# Patient Record
Sex: Female | Born: 1945
Health system: Southern US, Community
[De-identification: ages and names within clinical notes are randomized; demographics above are authoritative.]

## PROBLEM LIST (undated history)

## (undated) DIAGNOSIS — T8859XA Other complications of anesthesia, initial encounter: Secondary | ICD-10-CM

## (undated) DIAGNOSIS — H3530 Unspecified macular degeneration: Secondary | ICD-10-CM

## (undated) DIAGNOSIS — G4730 Sleep apnea, unspecified: Secondary | ICD-10-CM

## (undated) DIAGNOSIS — Z8489 Family history of other specified conditions: Secondary | ICD-10-CM

## (undated) DIAGNOSIS — E785 Hyperlipidemia, unspecified: Secondary | ICD-10-CM

## (undated) DIAGNOSIS — G43909 Migraine, unspecified, not intractable, without status migrainosus: Secondary | ICD-10-CM

## (undated) DIAGNOSIS — M1990 Unspecified osteoarthritis, unspecified site: Secondary | ICD-10-CM

## (undated) DIAGNOSIS — K5790 Diverticulosis of intestine, part unspecified, without perforation or abscess without bleeding: Secondary | ICD-10-CM

## (undated) DIAGNOSIS — H269 Unspecified cataract: Secondary | ICD-10-CM

## (undated) DIAGNOSIS — Z8719 Personal history of other diseases of the digestive system: Secondary | ICD-10-CM

## (undated) DIAGNOSIS — F32A Depression, unspecified: Secondary | ICD-10-CM

## (undated) DIAGNOSIS — I5032 Chronic diastolic (congestive) heart failure: Secondary | ICD-10-CM

## (undated) DIAGNOSIS — F419 Anxiety disorder, unspecified: Secondary | ICD-10-CM

## (undated) DIAGNOSIS — I471 Supraventricular tachycardia: Secondary | ICD-10-CM

## (undated) DIAGNOSIS — J449 Chronic obstructive pulmonary disease, unspecified: Secondary | ICD-10-CM

## (undated) DIAGNOSIS — K219 Gastro-esophageal reflux disease without esophagitis: Secondary | ICD-10-CM

## (undated) DIAGNOSIS — I4719 Other supraventricular tachycardia: Secondary | ICD-10-CM

## (undated) DIAGNOSIS — J189 Pneumonia, unspecified organism: Secondary | ICD-10-CM

## (undated) DIAGNOSIS — I700 Atherosclerosis of aorta: Secondary | ICD-10-CM

## (undated) DIAGNOSIS — C801 Malignant (primary) neoplasm, unspecified: Secondary | ICD-10-CM

## (undated) DIAGNOSIS — E669 Obesity, unspecified: Secondary | ICD-10-CM

## (undated) DIAGNOSIS — G4700 Insomnia, unspecified: Secondary | ICD-10-CM

## (undated) DIAGNOSIS — I509 Heart failure, unspecified: Secondary | ICD-10-CM

## (undated) DIAGNOSIS — G629 Polyneuropathy, unspecified: Secondary | ICD-10-CM

## (undated) DIAGNOSIS — I10 Essential (primary) hypertension: Secondary | ICD-10-CM

## (undated) DIAGNOSIS — I480 Paroxysmal atrial fibrillation: Secondary | ICD-10-CM

## (undated) HISTORY — PX: COLONOSCOPY: SHX174

## (undated) HISTORY — DX: Essential (primary) hypertension: I10

## (undated) HISTORY — DX: Other supraventricular tachycardia: I47.19

## (undated) HISTORY — DX: Diverticulosis of intestine, part unspecified, without perforation or abscess without bleeding: K57.90

## (undated) HISTORY — DX: Obesity, unspecified: E66.9

## (undated) HISTORY — PX: POLYPECTOMY: SHX149

## (undated) HISTORY — DX: Chronic obstructive pulmonary disease, unspecified: J44.9

## (undated) HISTORY — DX: Chronic diastolic (congestive) heart failure: I50.32

## (undated) HISTORY — DX: Anxiety disorder, unspecified: F41.9

## (undated) HISTORY — DX: Polyneuropathy, unspecified: G62.9

## (undated) HISTORY — DX: Hyperlipidemia, unspecified: E78.5

## (undated) HISTORY — DX: Gastro-esophageal reflux disease without esophagitis: K21.9

## (undated) HISTORY — PX: CERVICAL POLYPECTOMY: SHX88

## (undated) HISTORY — DX: Paroxysmal atrial fibrillation: I48.0

## (undated) HISTORY — PX: EYE SURGERY: SHX253

## (undated) HISTORY — DX: Insomnia, unspecified: G47.00

## (undated) HISTORY — DX: Atherosclerosis of aorta: I70.0

## (undated) HISTORY — PX: OTHER SURGICAL HISTORY: SHX169

## (undated) HISTORY — DX: Supraventricular tachycardia: I47.1

---

## 1997-10-21 ENCOUNTER — Ambulatory Visit (HOSPITAL_COMMUNITY): Admission: RE | Admit: 1997-10-21 | Discharge: 1997-10-21 | Payer: Self-pay | Admitting: Family Medicine

## 1998-05-20 DIAGNOSIS — E041 Nontoxic single thyroid nodule: Secondary | ICD-10-CM

## 1998-05-20 HISTORY — DX: Nontoxic single thyroid nodule: E04.1

## 1998-06-30 ENCOUNTER — Encounter: Admission: RE | Admit: 1998-06-30 | Discharge: 1998-09-28 | Payer: Self-pay | Admitting: Family Medicine

## 1999-03-28 ENCOUNTER — Other Ambulatory Visit: Admission: RE | Admit: 1999-03-28 | Discharge: 1999-03-28 | Payer: Self-pay | Admitting: Obstetrics and Gynecology

## 1999-09-18 ENCOUNTER — Other Ambulatory Visit: Admission: RE | Admit: 1999-09-18 | Discharge: 1999-09-18 | Payer: Self-pay | Admitting: Obstetrics and Gynecology

## 1999-10-25 ENCOUNTER — Encounter (INDEPENDENT_AMBULATORY_CARE_PROVIDER_SITE_OTHER): Payer: Self-pay | Admitting: Specialist

## 1999-10-25 ENCOUNTER — Other Ambulatory Visit: Admission: RE | Admit: 1999-10-25 | Discharge: 1999-10-25 | Payer: Self-pay | Admitting: Obstetrics and Gynecology

## 2000-03-31 ENCOUNTER — Other Ambulatory Visit: Admission: RE | Admit: 2000-03-31 | Discharge: 2000-03-31 | Payer: Self-pay | Admitting: Obstetrics and Gynecology

## 2000-09-16 ENCOUNTER — Other Ambulatory Visit: Admission: RE | Admit: 2000-09-16 | Discharge: 2000-09-16 | Payer: Self-pay | Admitting: Obstetrics and Gynecology

## 2001-02-17 ENCOUNTER — Encounter: Payer: Self-pay | Admitting: Family Medicine

## 2001-02-17 ENCOUNTER — Encounter: Admission: RE | Admit: 2001-02-17 | Discharge: 2001-02-17 | Payer: Self-pay | Admitting: Emergency Medicine

## 2001-02-23 ENCOUNTER — Encounter: Admission: RE | Admit: 2001-02-23 | Discharge: 2001-04-09 | Payer: Self-pay | Admitting: Family Medicine

## 2001-04-10 ENCOUNTER — Other Ambulatory Visit: Admission: RE | Admit: 2001-04-10 | Discharge: 2001-04-10 | Payer: Self-pay | Admitting: Obstetrics and Gynecology

## 2001-11-17 ENCOUNTER — Encounter: Payer: Self-pay | Admitting: Internal Medicine

## 2001-11-17 ENCOUNTER — Ambulatory Visit (HOSPITAL_COMMUNITY): Admission: RE | Admit: 2001-11-17 | Discharge: 2001-11-17 | Payer: Self-pay | Admitting: Internal Medicine

## 2002-04-06 ENCOUNTER — Encounter: Admission: RE | Admit: 2002-04-06 | Discharge: 2002-04-06 | Payer: Self-pay | Admitting: Family Medicine

## 2002-04-06 ENCOUNTER — Encounter: Payer: Self-pay | Admitting: Family Medicine

## 2002-05-03 ENCOUNTER — Other Ambulatory Visit: Admission: RE | Admit: 2002-05-03 | Discharge: 2002-05-03 | Payer: Self-pay | Admitting: Obstetrics and Gynecology

## 2003-06-28 ENCOUNTER — Ambulatory Visit (HOSPITAL_COMMUNITY): Admission: RE | Admit: 2003-06-28 | Discharge: 2003-06-28 | Payer: Self-pay | Admitting: Endocrinology

## 2003-07-21 ENCOUNTER — Encounter (HOSPITAL_COMMUNITY): Admission: RE | Admit: 2003-07-21 | Discharge: 2003-10-19 | Payer: Self-pay | Admitting: Endocrinology

## 2003-07-22 ENCOUNTER — Other Ambulatory Visit: Admission: RE | Admit: 2003-07-22 | Discharge: 2003-07-22 | Payer: Self-pay | Admitting: Obstetrics and Gynecology

## 2003-08-04 ENCOUNTER — Encounter (INDEPENDENT_AMBULATORY_CARE_PROVIDER_SITE_OTHER): Payer: Self-pay | Admitting: *Deleted

## 2003-08-04 ENCOUNTER — Ambulatory Visit (HOSPITAL_COMMUNITY): Admission: RE | Admit: 2003-08-04 | Discharge: 2003-08-04 | Payer: Self-pay | Admitting: Endocrinology

## 2003-10-14 ENCOUNTER — Ambulatory Visit (HOSPITAL_COMMUNITY): Admission: RE | Admit: 2003-10-14 | Discharge: 2003-10-14 | Payer: Self-pay | Admitting: Endocrinology

## 2004-04-04 ENCOUNTER — Ambulatory Visit: Payer: Self-pay | Admitting: Pulmonary Disease

## 2004-06-05 ENCOUNTER — Ambulatory Visit: Payer: Self-pay | Admitting: Adult Health

## 2004-06-12 ENCOUNTER — Ambulatory Visit: Payer: Self-pay | Admitting: Pulmonary Disease

## 2004-06-19 ENCOUNTER — Ambulatory Visit: Payer: Self-pay | Admitting: Pulmonary Disease

## 2004-08-29 ENCOUNTER — Ambulatory Visit: Payer: Self-pay | Admitting: Pulmonary Disease

## 2004-09-13 ENCOUNTER — Other Ambulatory Visit: Admission: RE | Admit: 2004-09-13 | Discharge: 2004-09-13 | Payer: Self-pay | Admitting: Obstetrics and Gynecology

## 2005-05-22 ENCOUNTER — Ambulatory Visit: Payer: Self-pay | Admitting: Pulmonary Disease

## 2005-09-05 ENCOUNTER — Ambulatory Visit: Payer: Self-pay | Admitting: Gastroenterology

## 2005-09-06 ENCOUNTER — Ambulatory Visit: Payer: Self-pay | Admitting: Gastroenterology

## 2005-09-24 ENCOUNTER — Ambulatory Visit: Payer: Self-pay | Admitting: Gastroenterology

## 2005-09-24 ENCOUNTER — Encounter (INDEPENDENT_AMBULATORY_CARE_PROVIDER_SITE_OTHER): Payer: Self-pay | Admitting: *Deleted

## 2006-03-06 ENCOUNTER — Ambulatory Visit: Payer: Self-pay | Admitting: Internal Medicine

## 2006-10-16 ENCOUNTER — Ambulatory Visit: Payer: Self-pay | Admitting: Pulmonary Disease

## 2006-10-31 ENCOUNTER — Ambulatory Visit: Payer: Self-pay | Admitting: Pulmonary Disease

## 2007-04-25 DIAGNOSIS — J209 Acute bronchitis, unspecified: Secondary | ICD-10-CM | POA: Insufficient documentation

## 2007-04-25 DIAGNOSIS — J441 Chronic obstructive pulmonary disease with (acute) exacerbation: Secondary | ICD-10-CM | POA: Insufficient documentation

## 2007-04-25 DIAGNOSIS — J45901 Unspecified asthma with (acute) exacerbation: Secondary | ICD-10-CM | POA: Insufficient documentation

## 2007-04-25 DIAGNOSIS — J449 Chronic obstructive pulmonary disease, unspecified: Secondary | ICD-10-CM

## 2007-11-19 ENCOUNTER — Telehealth (INDEPENDENT_AMBULATORY_CARE_PROVIDER_SITE_OTHER): Payer: Self-pay | Admitting: *Deleted

## 2010-06-09 ENCOUNTER — Encounter: Payer: Self-pay | Admitting: Endocrinology

## 2010-10-02 NOTE — Assessment & Plan Note (Signed)
[facility]                             PULMONARY OFFICE NOTE   [name] Molina, [name] Molina                        MRN:          [id number]  DATE:10/16/2006                            DOB:          1945-10-04    HISTORY OF PRESENT ILLNESS:  The patient is a 65 year old white female  patient of Dr. Sung Amabile, has a known history of mild COPD and asthmatic  bronchitis and a former smoker.  She presents today with a two-day  history of nasal congestion, postnasal drip, sore throat, and cough.  The patient denies any hemoptysis, orthopnea, PND or leg swelling.  The  patient complains that mucus is very thick and yellow.   PAST MEDICAL HISTORY:  Reviewed.   CURRENT MEDICATIONS:  Reviewed.   PHYSICAL EXAMINATION:  The patient is a pleasant female in no acute  distress.  She is afebrile with stable vital signs.  She is saturating at 97% on  room air.  HEENT:  Nasal mucosa is slightly pale.  Nontender esophagus.  Posterior  pharynx is clear.  NECK:  Supple without adenopathy.  LUNGS:  Sounds reveal coarse breath sounds without any wheezes or  crackles.  CARDIAC:  Regular rate.  ABDOMEN:  Soft and nontender.  EXTREMITIES:  Warm without any edema.   IMPRESSION/PLAN:  A mild upper respiratory infection.  The patient is to  begin Mucinex DM twice daily. The patient was given a prescription for a  Z-pack to hold in case symptoms worsen with discolored sputum.  The  patient was given Xopenex nebulizer treatment in the office.  The  patient will return here in one month with Dr. Sung Amabile or sooner if  needed.      Rubye Oaks, NP  Electronically Signed      [name], MD  Electronically Signed   TP/MedQ  DD: 10/16/2006  DT: 10/16/2006  Job #: [id number]

## 2010-10-05 NOTE — Assessment & Plan Note (Signed)
[facility]                               PULMONARY OFFICE NOTE   NAME:[name] Molina, [name] Molina                        MRN:          [id number]  DATE:03/06/2006                            DOB:          10/30/1945    HISTORY OF PRESENT ILLNESS:  The patient is a 65 year old white female, a  patient of Dr. Sung Amabile, who has a known history of mild COPD and asthmatic  bronchitis and a former smoker.  The patient presents for acute office visit  with a 2-day history of nasal congestion, productive cough, and general  malaise.  The patient denies hemoptysis, chest pain, orthopnea, PND, or leg  swelling.   The patient is maintained on:  1. Advair 250/50, twice daily.  2. Albuterol p.r.n.   PHYSICAL EXAMINATION:  GENERAL:  The patient is a pleasant female in no  acute distress.  VITAL SIGNS:  She is afebrile with stable vital signs.  Her O2 saturation is  94% on room air.  HEENT:  Unremarkable.  NECK:  Supple without adenopathy or bruit.  LUNGS:  Sounds are clear without any wheezing or crackles.  CARDIAC:  Regular rate and rhythm.  ABDOMEN:  Soft and obese.  EXTREMITIES:  Warm without any edema.   IMPRESSION:  Acute upper respiratory infection.   PLAN:  The patient is to begin Mucinex DM twice daily, may use Endal HD as  needed for cough, #8 ounces given.  The patient is aware of this.  The  patient is given a prescription for [facility] for 7 days, however, I have  instructed the patient to have this on hold and only to use it if symptoms  worsen over the next 5-7 days.  I suspect this may be viral in nature.      ______________________________  Rubye Oaks, NP    ______________________________  [name], MD     TP/MedQ  DD:  03/06/2006  DT:  03/08/2006  Job #:  [id number]

## 2010-10-27 ENCOUNTER — Encounter: Payer: Self-pay | Admitting: Gastroenterology

## 2010-11-05 ENCOUNTER — Encounter: Payer: Self-pay | Admitting: Gastroenterology

## 2010-11-05 ENCOUNTER — Ambulatory Visit (AMBULATORY_SURGERY_CENTER): Payer: 59 | Admitting: *Deleted

## 2010-11-05 VITALS — Ht 63.0 in | Wt 195.0 lb

## 2010-11-05 DIAGNOSIS — Z86010 Personal history of colonic polyps: Secondary | ICD-10-CM

## 2010-11-05 MED ORDER — PEG-KCL-NACL-NASULF-NA ASC-C 100 G PO SOLR: ORAL | Status: DC

## 2010-11-19 ENCOUNTER — Encounter: Payer: Self-pay | Admitting: Gastroenterology

## 2010-11-19 ENCOUNTER — Ambulatory Visit (AMBULATORY_SURGERY_CENTER): Payer: 59 | Admitting: Gastroenterology

## 2010-11-19 VITALS — BP 128/74 | HR 97 | Temp 98.6°F | Resp 20 | Ht 63.0 in | Wt 185.0 lb

## 2010-11-19 DIAGNOSIS — Z1211 Encounter for screening for malignant neoplasm of colon: Secondary | ICD-10-CM

## 2010-11-19 DIAGNOSIS — K5730 Diverticulosis of large intestine without perforation or abscess without bleeding: Secondary | ICD-10-CM

## 2010-11-19 DIAGNOSIS — Z86010 Personal history of colonic polyps: Secondary | ICD-10-CM

## 2010-11-19 MED ORDER — SODIUM CHLORIDE 0.9 % IV SOLN: 500.0000 mL | INTRAVENOUS | Status: DC

## 2010-11-19 NOTE — Patient Instructions (Signed)
[employer] reviewed with patient and care partner.  [facility] Discharge Instructions signed by care partner.  Impressions:  Hemorrhoids (handout given) Diverticulosis (handout given)  High Fiber Diet handout given.  Repeat exam in 7 years.  Continue medications as you were taking them prior to your procedure.

## 2010-11-19 NOTE — Progress Notes (Signed)
Pt was uncomfortable while the scope was being advanced.  Per Dr. Arlyce Dice give IV Benadryl 12.5 mg.  Done. MAW  After the cecum was reached the pt relaxed and rested comfortably.  No complaints noted.  MAW

## 2010-11-20 ENCOUNTER — Telehealth: Payer: Self-pay

## 2010-11-20 NOTE — Telephone Encounter (Signed)

## 2011-05-22 DIAGNOSIS — J209 Acute bronchitis, unspecified: Secondary | ICD-10-CM | POA: Diagnosis not present

## 2011-05-22 DIAGNOSIS — J45909 Unspecified asthma, uncomplicated: Secondary | ICD-10-CM | POA: Diagnosis not present

## 2011-06-13 ENCOUNTER — Encounter: Payer: Self-pay | Admitting: Emergency Medicine

## 2011-06-13 ENCOUNTER — Ambulatory Visit (INDEPENDENT_AMBULATORY_CARE_PROVIDER_SITE_OTHER): Payer: Medicare Other | Admitting: Emergency Medicine

## 2011-06-13 DIAGNOSIS — R51 Headache: Secondary | ICD-10-CM

## 2011-06-13 DIAGNOSIS — I10 Essential (primary) hypertension: Secondary | ICD-10-CM | POA: Diagnosis not present

## 2011-06-13 DIAGNOSIS — J438 Other emphysema: Secondary | ICD-10-CM | POA: Diagnosis not present

## 2011-06-13 DIAGNOSIS — J449 Chronic obstructive pulmonary disease, unspecified: Secondary | ICD-10-CM | POA: Insufficient documentation

## 2011-06-13 DIAGNOSIS — J309 Allergic rhinitis, unspecified: Secondary | ICD-10-CM

## 2011-06-13 DIAGNOSIS — G8929 Other chronic pain: Secondary | ICD-10-CM | POA: Insufficient documentation

## 2011-06-13 NOTE — Assessment & Plan Note (Signed)
Continue singulair for now Restart allegra qd Start nasal steroid for the next month

## 2011-06-13 NOTE — Progress Notes (Signed)
Subjective:    Patient ID: [name] Molina, female    DOB: 01-03-1946, 66 y.o.   MRN: [id number]  HPI 66 yo woman, hx former tobacco and childhood asthma. Formerly followed by Dr Sung Amabile for mild COPD. Also hx GERD, HTN.  She was managed on Advair for years, but now that she is on Medicare she is looking for a less expensive option. She stopped the Advair in November.  Also, since September she has had much more trouble with nasal gtt, throat irritation, cough, throat clearing. More SABA use.    Review of Systems  Constitutional: Negative.  Negative for fever and unexpected weight change.  HENT: Positive for sneezing. Negative for ear pain, nosebleeds, congestion, sore throat, rhinorrhea, trouble swallowing, dental problem, postnasal drip and sinus pressure.   Eyes: Negative.  Negative for redness and itching.  Respiratory: Positive for cough and shortness of breath. Negative for chest tightness and wheezing.   Cardiovascular: Positive for leg swelling. Negative for palpitations.  Gastrointestinal: Negative.  Negative for nausea and vomiting.  Genitourinary: Negative.  Negative for dysuria.  Musculoskeletal: Negative.  Negative for joint swelling.  Skin: Negative.  Negative for rash.  Neurological: Positive for headaches.  Hematological: Does not bruise/bleed easily.  Psychiatric/Behavioral: Negative.  Negative for dysphoric mood. The patient is not nervous/anxious.    Past Medical History  Diagnosis Date  . COPD (chronic obstructive pulmonary disease)   . Asthma   . GERD (gastroesophageal reflux disease)   . Hyperlipidemia   . Hypertension   . Atrial tachycardia   . Diverticulosis   . Insomnia   . Anxiety   . Neuropathy      Family History  Problem Relation Age of Onset  . Colon polyps Sister 44    PRECANCEROUS POLYPS  . Breast cancer Maternal Grandmother 49  . Emphysema Father   . Asthma Father   . Heart disease Father   . Heart disease Paternal Grandfather      History     Social History  . Marital Status: Divorced    Spouse Name: N/A    Number of Children: N/A  . Years of Education: N/A   Occupational History  . Psychologist, prison and probation services    Social History Main Topics  . Smoking status: Former Smoker -- 1.0 packs/day for 20 years    Types: Cigarettes    Quit date: 05/21/1991  . Smokeless tobacco: Never Used  . Alcohol Use: No  . Drug Use: No  . Sexually Active: Not on file   Other Topics Concern  . Not on file   Social History Narrative  . No narrative on file     Allergies  Allergen Reactions  . Food Shortness Of Breath    ALLERGY= MUSHROOMS  . Tequin Shortness Of Breath  . Codeine Nausea And Vomiting  . Erythromycin Other (See Comments)    GI UPSET  . Wellbutrin (Bupropion Hcl) Other (See Comments)    SHAKING  . Penicillins Rash  . Prednisone Rash     Outpatient Prescriptions Prior to Visit  Medication Sig Dispense Refill  . Albuterol (VENTOLIN IN) Inhale 2 puffs into the lungs as needed.        Marland Kitchen aspirin 81 MG tablet Take 81 mg by mouth daily.        . B Complex-C (SUPER B COMPLEX PO) Take 1 tablet by mouth daily.        . Calcium Carbonate-Vit D-Min (CALCIUM 1200 PO) Take 1 tablet by mouth daily. (slow release)       .  diltiazem (CARDIZEM CD) 120 MG 24 hr capsule Take 120 mg by mouth Daily.      Marland Kitchen eletriptan (RELPAX) 40 MG tablet One tablet by mouth as needed for migraine headache.  If the headache improves and then returns, dose may be repeated after 2 hours have elapsed since first dose (do not exceed 80 mg per day). may repeat in 2 hours if necessary       . FLUoxetine (PROZAC) 20 MG capsule Take 20 mg by mouth Daily.      . indomethacin (INDOCIN) 50 MG capsule Take 50 mg by mouth 2 (two) times daily as needed.        Marland Kitchen LORazepam (ATIVAN) 0.5 MG tablet Take 0.5 mg by mouth as needed. 1/2 TAB-1 TAB       . losartan (COZAAR) 50 MG tablet Take 50 mg by mouth Daily.      . Multiple Vitamins-Minerals (OCUVITE PO) Take 2 tablets by  mouth daily.        Marland Kitchen omeprazole (PRILOSEC) 20 MG capsule Take 20 mg by mouth Nightly.        . phentermine 30 MG capsule Take 30 mg by mouth every morning.        . pravastatin (PRAVACHOL) 40 MG tablet Take 40 mg by mouth Daily.      . temazepam (RESTORIL) 30 MG capsule Take 1 tablet by mouth Nightly.      Marland Kitchen UNABLE TO FIND Inject 35 Units as directed once a week. Med Name: HCG       . ADVAIR DISKUS 250-50 MCG/DOSE AEPB Inhale 1 puff into the lungs Twice daily.       . peg 3350 powder (MOVIPREP) 100 G SOLR MOVIPREP-TAKE AS DIRECTED  1 kit  0   Facility-Administered Medications Prior to Visit  Medication Dose Route Frequency Provider Last Rate Last Dose  . 0.9 %  sodium chloride infusion  500 mL Intravenous Continuous Louis Meckel, MD           Objective:   Physical Exam  Gen: Pleasant, well-nourished, in no distress,  normal affect  ENT: No lesions,  mouth clear,  oropharynx clear, no postnasal drip  Neck: No JVD, no TMG, no carotid bruits  Lungs: No use of accessory muscles, no dullness to percussion, clear without rales or rhonchi  Cardiovascular: RRR, heart sounds normal, no murmur or gallops, no peripheral edema  Musculoskeletal: No deformities, no cyanosis or clubbing  Neuro: alert, non focal  Skin: Warm, no lesions or rashes     Assessment & Plan:  Allergic rhinitis Continue singulair for now Restart allegra qd Start nasal steroid for the next month  COPD Please continue Symbicort twice a day for now. We may decide to change this depending how you feel when your allergies are well controlled.  We will work on getting financial assistance for the Symbicort or Advair.

## 2011-06-13 NOTE — Assessment & Plan Note (Signed)
Please continue Symbicort twice a day for now. We may decide to change this depending how you feel when your allergies are well controlled.  We will work on getting financial assistance for the Symbicort or Advair.

## 2011-06-13 NOTE — Patient Instructions (Signed)
Please continue Symbicort twice a day for now. We may decide to change this depending how you feel when your allergies are well controlled.  We will work on getting financial assistance for the Symbicort or Advair. Continue your Singulair Restart generic allegra every day Start Qnasl, 2 sprays each nostril daily.  Follow with Dr Delton Coombes in 1 month

## 2011-06-24 ENCOUNTER — Encounter (INDEPENDENT_AMBULATORY_CARE_PROVIDER_SITE_OTHER): Payer: Medicare Other | Admitting: Ophthalmology

## 2011-06-24 DIAGNOSIS — H43819 Vitreous degeneration, unspecified eye: Secondary | ICD-10-CM

## 2011-06-24 DIAGNOSIS — H3530 Unspecified macular degeneration: Secondary | ICD-10-CM | POA: Diagnosis not present

## 2011-06-24 DIAGNOSIS — H353290 Exudative age-related macular degeneration, unspecified eye, stage unspecified: Secondary | ICD-10-CM

## 2011-06-24 DIAGNOSIS — H2510 Age-related nuclear cataract, unspecified eye: Secondary | ICD-10-CM | POA: Diagnosis not present

## 2011-06-24 DIAGNOSIS — H25049 Posterior subcapsular polar age-related cataract, unspecified eye: Secondary | ICD-10-CM

## 2011-06-28 ENCOUNTER — Encounter (INDEPENDENT_AMBULATORY_CARE_PROVIDER_SITE_OTHER): Payer: Medicare Other | Admitting: Ophthalmology

## 2011-06-28 DIAGNOSIS — H353290 Exudative age-related macular degeneration, unspecified eye, stage unspecified: Secondary | ICD-10-CM | POA: Diagnosis not present

## 2011-06-28 DIAGNOSIS — H43819 Vitreous degeneration, unspecified eye: Secondary | ICD-10-CM

## 2011-06-28 DIAGNOSIS — H3530 Unspecified macular degeneration: Secondary | ICD-10-CM | POA: Diagnosis not present

## 2011-07-22 ENCOUNTER — Encounter: Payer: Self-pay | Admitting: Emergency Medicine

## 2011-07-22 ENCOUNTER — Encounter (INDEPENDENT_AMBULATORY_CARE_PROVIDER_SITE_OTHER): Payer: Medicare Other | Admitting: Ophthalmology

## 2011-07-22 ENCOUNTER — Ambulatory Visit (INDEPENDENT_AMBULATORY_CARE_PROVIDER_SITE_OTHER): Payer: Medicare Other | Admitting: Emergency Medicine

## 2011-07-22 DIAGNOSIS — H2510 Age-related nuclear cataract, unspecified eye: Secondary | ICD-10-CM

## 2011-07-22 DIAGNOSIS — H3530 Unspecified macular degeneration: Secondary | ICD-10-CM

## 2011-07-22 DIAGNOSIS — H353290 Exudative age-related macular degeneration, unspecified eye, stage unspecified: Secondary | ICD-10-CM

## 2011-07-22 DIAGNOSIS — J4489 Other specified chronic obstructive pulmonary disease: Secondary | ICD-10-CM

## 2011-07-22 DIAGNOSIS — J309 Allergic rhinitis, unspecified: Secondary | ICD-10-CM | POA: Diagnosis not present

## 2011-07-22 DIAGNOSIS — H43819 Vitreous degeneration, unspecified eye: Secondary | ICD-10-CM

## 2011-07-22 DIAGNOSIS — J449 Chronic obstructive pulmonary disease, unspecified: Secondary | ICD-10-CM

## 2011-07-22 NOTE — Progress Notes (Signed)
  Subjective:    Patient ID: [name] Molina, female    DOB: [birth year], 66 y.o.   MRN: [id number]  HPI 66 yo woman, hx former tobacco and childhood asthma. Formerly followed by Dr Sung Amabile for mild COPD. Also hx GERD, HTN.  She was managed on Advair for years, but now that she is on Medicare she is looking for a less expensive option. She stopped the Advair in November.  Also, since September she has had much more trouble with nasal gtt, throat irritation, cough, throat clearing. More SABA use.   ROV 07/22/11 -- follows up for COPD +/- asthma, allergies and cough.  Continued singulair, restarted allegra and added Qnasl. She is taking symbicort bid, needs to get financial assistance w this. Her nasal gtt is a lot better now, in fact her nose is dry on the Qnasl. Her breathing seems to be better also.        Objective:   Physical Exam  Gen: Pleasant, well-nourished, in no distress,  normal affect  ENT: No lesions,  mouth clear,  oropharynx clear, no postnasal drip  Neck: No JVD, no TMG, no carotid bruits  Lungs: No use of accessory muscles, no dullness to percussion, clear without rales or rhonchi  Cardiovascular: RRR, heart sounds normal, no murmur or gallops, no peripheral edema  Musculoskeletal: No deformities, no cyanosis or clubbing  Neuro: alert, non focal  Skin: Warm, no lesions or rashes     Assessment & Plan:  Allergic rhinitis Improved, dryness w the Qnasl.  - stop the nasal steroid - continue the singulair and allegra for now, consider stopping these serially to see if she tolerates.  - try stopping the symbicort and see if she misses.  - use SABA prn. - rov 6 weeks to see if she misses  COPD - trial off Symbicort now that her allergies are better controlled.

## 2011-07-22 NOTE — Assessment & Plan Note (Signed)
Improved, dryness w the Qnasl.  - stop the nasal steroid - continue the singulair and allegra for now, consider stopping these serially to see if she tolerates.  - try stopping the symbicort and see if she misses.  - use SABA prn. - rov 6 weeks to see if she misses

## 2011-07-22 NOTE — Assessment & Plan Note (Signed)
-   trial off Symbicort now that her allergies are better controlled.

## 2011-07-22 NOTE — Patient Instructions (Signed)
Please continue the allegra (fexofenadine) and singulair Stop Qnasl spray Try stopping Symbicort for now. Staty off it for at least 10 days. If your breathing misses this medication then call our office to let us know that you are restarting.  Keep your albuterol inhaler available to use as needed Follow with Dr Delton Coombes in 6 weeks to discuss your status

## 2011-08-05 ENCOUNTER — Encounter (INDEPENDENT_AMBULATORY_CARE_PROVIDER_SITE_OTHER): Payer: 59 | Admitting: Ophthalmology

## 2011-08-16 ENCOUNTER — Encounter (INDEPENDENT_AMBULATORY_CARE_PROVIDER_SITE_OTHER): Payer: Medicare Other | Admitting: Ophthalmology

## 2011-08-16 DIAGNOSIS — H2510 Age-related nuclear cataract, unspecified eye: Secondary | ICD-10-CM | POA: Diagnosis not present

## 2011-08-16 DIAGNOSIS — H3530 Unspecified macular degeneration: Secondary | ICD-10-CM | POA: Diagnosis not present

## 2011-08-16 DIAGNOSIS — H43819 Vitreous degeneration, unspecified eye: Secondary | ICD-10-CM

## 2011-08-16 DIAGNOSIS — H353290 Exudative age-related macular degeneration, unspecified eye, stage unspecified: Secondary | ICD-10-CM

## 2011-08-29 DIAGNOSIS — E785 Hyperlipidemia, unspecified: Secondary | ICD-10-CM | POA: Diagnosis not present

## 2011-08-29 DIAGNOSIS — F329 Major depressive disorder, single episode, unspecified: Secondary | ICD-10-CM | POA: Diagnosis not present

## 2011-08-29 DIAGNOSIS — I10 Essential (primary) hypertension: Secondary | ICD-10-CM | POA: Diagnosis not present

## 2011-08-29 DIAGNOSIS — E0590 Thyrotoxicosis, unspecified without thyrotoxic crisis or storm: Secondary | ICD-10-CM | POA: Diagnosis not present

## 2011-08-29 DIAGNOSIS — Z23 Encounter for immunization: Secondary | ICD-10-CM | POA: Diagnosis not present

## 2011-09-09 ENCOUNTER — Ambulatory Visit (INDEPENDENT_AMBULATORY_CARE_PROVIDER_SITE_OTHER): Payer: Medicare Other | Admitting: Emergency Medicine

## 2011-09-09 ENCOUNTER — Encounter: Payer: Self-pay | Admitting: Emergency Medicine

## 2011-09-09 VITALS — BP 118/74 | HR 80 | Temp 97.7°F | Ht 63.0 in | Wt 189.0 lb

## 2011-09-09 DIAGNOSIS — J449 Chronic obstructive pulmonary disease, unspecified: Secondary | ICD-10-CM | POA: Diagnosis not present

## 2011-09-09 NOTE — Progress Notes (Signed)
  Subjective:    Patient ID: [name] Molina, female    DOB: 1946-02-04, 66 y.o.   MRN: [id number]  HPI 66 yo woman, hx former tobacco and childhood asthma. Formerly followed by Dr Sung Amabile for mild COPD. Also hx GERD, HTN.  She was managed on Advair for years, but now that she is on Medicare she is looking for a less expensive option. She stopped the Advair in November.  Also, since September she has had much more trouble with nasal gtt, throat irritation, cough, throat clearing. More SABA use.   ROV 07/22/11 -- OSA/OHS, COPD, allergies and chronic cough.  Continued singulair, restarted allegra and added Qnasl. She is taking symbicort bid, needs to get financial assistance w this. Her nasal gtt is a lot better now, in fact her nose is dry on the Qnasl. Her breathing seems to be better also.   ROV 09/09/11 -- OSA/OHS, COPD, allergies and cough. We stopped Qnasl due to dryness, tried stopping Symbicort but needed to restart it. She has since run out and is ready to start Dulera 1 puff bid, possibly uptitrate to 2 puffs bid. She is on [employer]. No exacerbations since last time      Objective:   Physical Exam  Gen: Pleasant, well-nourished, in no distress,  normal affect  ENT: No lesions,  mouth clear,  oropharynx clear, no postnasal drip  Neck: No JVD, no TMG, no carotid bruits  Lungs: No use of accessory muscles, no dullness to percussion, clear without rales or rhonchi  Cardiovascular: RRR, heart sounds normal, no murmur or gallops, no peripheral edema  Musculoskeletal: No deformities, no cyanosis or clubbing  Neuro: alert, non focal  Skin: Warm, no lesions or rashes     Assessment & Plan:  COPD She has started and stopped her LABA/ICS as she felt like it. I have encouraged her to start using Dulera at half dose and to stay on it every day to see if she improves. She will call our office to tell us if she wants [place] to prescribe. We can discuss upping dose later.

## 2011-09-09 NOTE — Assessment & Plan Note (Signed)
She has started and stopped her LABA/ICS as she felt like it. I have encouraged her to start using Dulera at half dose and to stay on it every day to see if she improves. She will call our office to tell us if she wants [place] to prescribe. We can discuss upping dose later.

## 2011-09-09 NOTE — Patient Instructions (Signed)
Please take Dulera 1 puff twice a day. Call our office if this benefits you and we will send prescription to your pharmacy Stay on allegra and singulair.  Use albuterol as needed Follow with Dr Delton Coombes in 4 months or sooner if you have any problems

## 2011-09-20 ENCOUNTER — Encounter (INDEPENDENT_AMBULATORY_CARE_PROVIDER_SITE_OTHER): Payer: Medicare Other | Admitting: Ophthalmology

## 2011-09-20 DIAGNOSIS — H3530 Unspecified macular degeneration: Secondary | ICD-10-CM

## 2011-09-20 DIAGNOSIS — H353290 Exudative age-related macular degeneration, unspecified eye, stage unspecified: Secondary | ICD-10-CM

## 2011-09-20 DIAGNOSIS — H43819 Vitreous degeneration, unspecified eye: Secondary | ICD-10-CM | POA: Diagnosis not present

## 2011-10-25 ENCOUNTER — Encounter (INDEPENDENT_AMBULATORY_CARE_PROVIDER_SITE_OTHER): Payer: Medicare Other | Admitting: Ophthalmology

## 2011-10-25 DIAGNOSIS — H3530 Unspecified macular degeneration: Secondary | ICD-10-CM | POA: Diagnosis not present

## 2011-10-25 DIAGNOSIS — H43819 Vitreous degeneration, unspecified eye: Secondary | ICD-10-CM | POA: Diagnosis not present

## 2011-10-25 DIAGNOSIS — H353290 Exudative age-related macular degeneration, unspecified eye, stage unspecified: Secondary | ICD-10-CM | POA: Diagnosis not present

## 2011-10-25 DIAGNOSIS — H2510 Age-related nuclear cataract, unspecified eye: Secondary | ICD-10-CM | POA: Diagnosis not present

## 2011-11-06 ENCOUNTER — Telehealth: Payer: Self-pay | Admitting: Emergency Medicine

## 2011-11-06 MED ORDER — MOMETASONE FURO-FORMOTEROL FUM 200-5 MCG/ACT IN AERO: 1.0000 | INHALATION_SPRAY | Freq: Two times a day (BID) | RESPIRATORY_TRACT | Status: DC

## 2011-11-06 NOTE — Telephone Encounter (Signed)
Per 4.22.13 ov note:  Patient Instructions     Please take Dulera 1 puff twice a day. Call our office if this benefits you and we will send prescription to your pharmacy  Stay on allegra and singulair.  Use albuterol as needed  Follow with Dr Delton Coombes in 4 months or sooner if you have any problems    LMOM TCB x1

## 2011-11-06 NOTE — Telephone Encounter (Signed)
Called and spoke with pt. She states that the dulera seems to be helping with her breathing and wants to have an rx called in. I have sent this to her pharmacy and she states nothing further needed.

## 2011-11-26 DIAGNOSIS — Z1231 Encounter for screening mammogram for malignant neoplasm of breast: Secondary | ICD-10-CM | POA: Diagnosis not present

## 2011-11-26 DIAGNOSIS — Z124 Encounter for screening for malignant neoplasm of cervix: Secondary | ICD-10-CM | POA: Diagnosis not present

## 2011-11-29 ENCOUNTER — Encounter (INDEPENDENT_AMBULATORY_CARE_PROVIDER_SITE_OTHER): Payer: Medicare Other | Admitting: Ophthalmology

## 2011-11-29 DIAGNOSIS — H2510 Age-related nuclear cataract, unspecified eye: Secondary | ICD-10-CM

## 2011-11-29 DIAGNOSIS — H35039 Hypertensive retinopathy, unspecified eye: Secondary | ICD-10-CM

## 2011-11-29 DIAGNOSIS — H43819 Vitreous degeneration, unspecified eye: Secondary | ICD-10-CM

## 2011-11-29 DIAGNOSIS — H353290 Exudative age-related macular degeneration, unspecified eye, stage unspecified: Secondary | ICD-10-CM

## 2011-11-29 DIAGNOSIS — I10 Essential (primary) hypertension: Secondary | ICD-10-CM

## 2011-11-29 DIAGNOSIS — H3530 Unspecified macular degeneration: Secondary | ICD-10-CM

## 2012-01-03 ENCOUNTER — Encounter (INDEPENDENT_AMBULATORY_CARE_PROVIDER_SITE_OTHER): Payer: Medicare Other | Admitting: Ophthalmology

## 2012-01-06 ENCOUNTER — Encounter (INDEPENDENT_AMBULATORY_CARE_PROVIDER_SITE_OTHER): Payer: Medicare Other | Admitting: Ophthalmology

## 2012-01-06 DIAGNOSIS — H353290 Exudative age-related macular degeneration, unspecified eye, stage unspecified: Secondary | ICD-10-CM

## 2012-01-06 DIAGNOSIS — H43819 Vitreous degeneration, unspecified eye: Secondary | ICD-10-CM

## 2012-01-06 DIAGNOSIS — H3530 Unspecified macular degeneration: Secondary | ICD-10-CM

## 2012-01-06 DIAGNOSIS — H2510 Age-related nuclear cataract, unspecified eye: Secondary | ICD-10-CM

## 2012-01-06 DIAGNOSIS — I10 Essential (primary) hypertension: Secondary | ICD-10-CM

## 2012-01-06 DIAGNOSIS — H35039 Hypertensive retinopathy, unspecified eye: Secondary | ICD-10-CM

## 2012-01-07 ENCOUNTER — Ambulatory Visit (INDEPENDENT_AMBULATORY_CARE_PROVIDER_SITE_OTHER): Payer: Medicare Other | Admitting: Emergency Medicine

## 2012-01-07 ENCOUNTER — Encounter: Payer: Self-pay | Admitting: Emergency Medicine

## 2012-01-07 VITALS — BP 128/52 | Temp 97.9°F | Ht 63.0 in | Wt 202.8 lb

## 2012-01-07 DIAGNOSIS — J449 Chronic obstructive pulmonary disease, unspecified: Secondary | ICD-10-CM | POA: Diagnosis not present

## 2012-01-07 DIAGNOSIS — J309 Allergic rhinitis, unspecified: Secondary | ICD-10-CM

## 2012-01-07 MED ORDER — FLUTICASONE-SALMETEROL 250-50 MCG/DOSE IN AEPB: 1.0000 | INHALATION_SPRAY | Freq: Two times a day (BID) | RESPIRATORY_TRACT | Status: DC

## 2012-01-07 NOTE — Assessment & Plan Note (Signed)
Change Dulera back to Advair 250 SABA prn

## 2012-01-07 NOTE — Assessment & Plan Note (Signed)
Continue the allegra

## 2012-01-07 NOTE — Progress Notes (Signed)
  Subjective:    Patient ID: [name] Molina, female    DOB: [birth year], 65 y.o.   MRN: [id number] HPI 66 yo woman, hx former tobacco and childhood asthma. Formerly followed by Dr Sung Amabile for mild COPD. Also hx GERD, HTN.  She was managed on Advair for years, but now that she is on Medicare she is looking for a less expensive option. She stopped the Advair in November.  Also, since September she has had much more trouble with nasal gtt, throat irritation, cough, throat clearing. More SABA use.   ROV 07/22/11 -- OSA/OHS, COPD, allergies and chronic cough.  Continued singulair, restarted allegra and added Qnasl. She is taking symbicort bid, needs to get financial assistance w this. Her nasal gtt is a lot better now, in fact her nose is dry on the Qnasl. Her breathing seems to be better also.   ROV 09/09/11 -- OSA/OHS, COPD, allergies and cough. We stopped Qnasl due to dryness, tried stopping Symbicort but needed to restart it. She has since run out and is ready to start Dulera 1 puff bid, possibly uptitrate to 2 puffs bid. She is on [employer] forward[employer]. No exacerbations since last time   ROV 01/07/12 -- OSA/OHS, COPD, allergies and cough.  She had been on Dulera due to cost, but now wants to go back to Advair - same cost. She believes she can stop the singulair if she is back on Advair. She is benefiting from [employer].       Objective:   Physical Exam Filed Vitals:   01/07/12 1400  BP: 128/52  Temp: 97.9 F (36.6 C)   Gen: Pleasant, well-nourished, in no distress,  normal affect  ENT: No lesions,  mouth clear,  oropharynx clear, no postnasal drip  Neck: No JVD, no TMG, no carotid bruits  Lungs: No use of accessory muscles, no dullness to percussion, clear without rales or rhonchi  Cardiovascular: RRR, heart sounds normal, no murmur or gallops, no peripheral edema  Musculoskeletal: No deformities, no cyanosis or clubbing  Neuro: alert, non focal  Skin: Warm, no lesions or rashes       Assessment & Plan:  COPD Change Dulera back to Advair 250 SABA prn  Allergic rhinitis Continue the allegra

## 2012-01-07 NOTE — Patient Instructions (Addendum)
Please stop Dulera and Singulair Restart Advair 250/50, inhale twice a day If you notice problems after stopping Singulair, then you can restart it once each evening Follow with Dr Delton Coombes in 6 months or sooner if you have any problems

## 2012-01-09 ENCOUNTER — Telehealth: Payer: Self-pay | Admitting: Emergency Medicine

## 2012-01-09 NOTE — Telephone Encounter (Signed)
Called for PA on the advair 250/50.  Looks like pt has tried and failed with symbicort and dulera.  PA has been approved through 05/19/2012 for the advair.  i have called the pharmacy to make them aware.     [place]---[phone] for this PA.

## 2012-01-22 ENCOUNTER — Emergency Department (HOSPITAL_COMMUNITY)
Admission: EM | Admit: 2012-01-22 | Discharge: 2012-01-23 | Disposition: A | Discharge status: Home / Self Care | Payer: Medicare Other | Attending: Emergency Medicine | Admitting: Emergency Medicine

## 2012-01-22 ENCOUNTER — Encounter (HOSPITAL_COMMUNITY): Payer: Self-pay | Admitting: Emergency Medicine

## 2012-01-22 DIAGNOSIS — Z8249 Family history of ischemic heart disease and other diseases of the circulatory system: Secondary | ICD-10-CM | POA: Diagnosis not present

## 2012-01-22 DIAGNOSIS — Z880 Allergy status to penicillin: Secondary | ICD-10-CM | POA: Diagnosis not present

## 2012-01-22 DIAGNOSIS — Z881 Allergy status to other antibiotic agents status: Secondary | ICD-10-CM | POA: Insufficient documentation

## 2012-01-22 DIAGNOSIS — Z888 Allergy status to other drugs, medicaments and biological substances status: Secondary | ICD-10-CM | POA: Insufficient documentation

## 2012-01-22 DIAGNOSIS — Z803 Family history of malignant neoplasm of breast: Secondary | ICD-10-CM | POA: Diagnosis not present

## 2012-01-22 DIAGNOSIS — F17200 Nicotine dependence, unspecified, uncomplicated: Secondary | ICD-10-CM | POA: Diagnosis not present

## 2012-01-22 DIAGNOSIS — K219 Gastro-esophageal reflux disease without esophagitis: Secondary | ICD-10-CM | POA: Diagnosis not present

## 2012-01-22 DIAGNOSIS — F411 Generalized anxiety disorder: Secondary | ICD-10-CM | POA: Insufficient documentation

## 2012-01-22 DIAGNOSIS — S91319A Laceration without foreign body, unspecified foot, initial encounter: Secondary | ICD-10-CM

## 2012-01-22 DIAGNOSIS — Z8489 Family history of other specified conditions: Secondary | ICD-10-CM | POA: Insufficient documentation

## 2012-01-22 DIAGNOSIS — J45909 Unspecified asthma, uncomplicated: Secondary | ICD-10-CM | POA: Insufficient documentation

## 2012-01-22 DIAGNOSIS — S91309A Unspecified open wound, unspecified foot, initial encounter: Secondary | ICD-10-CM | POA: Diagnosis not present

## 2012-01-22 DIAGNOSIS — I10 Essential (primary) hypertension: Secondary | ICD-10-CM | POA: Insufficient documentation

## 2012-01-22 DIAGNOSIS — Z885 Allergy status to narcotic agent status: Secondary | ICD-10-CM | POA: Diagnosis not present

## 2012-01-22 MED ORDER — OXYCODONE-ACETAMINOPHEN 5-325 MG PO TABS
1.0000 | ORAL_TABLET | Freq: Once | ORAL | Status: DC
  Filled 2012-01-22: qty 1

## 2012-01-22 NOTE — ED Notes (Signed)
EDP [place] in with pt

## 2012-01-22 NOTE — ED Notes (Signed)
Pt alert, arrives from Urgent Care, c/o laceration to left ankle, onset today, DSD in place, bleeding stopped, per family 3cm in length, "to the bone", resp even unlabored, skin pwd

## 2012-01-23 NOTE — ED Provider Notes (Signed)
History     CSN: [id number]  Arrival date & time 01/22/12  1927   First MD Initiated Contact with Patient 01/22/12 2301      Chief Complaint  Patient presents with  . Extremity Laceration    (Consider location/radiation/quality/duration/timing/severity/associated sxs/prior treatment) HPI 66 year old female presents to emergency apartment with complaint of left heel laceration. Patient reports her storm door broke lacerating her heel. She has some pain with walking and with bearing weight. Patient initially seen at urgent care and sent to the emergency department as a laceration was "down to bone". Patient's last tetanus was within the last year. She denies any other injury or complaint at this time.  Past Medical History  Diagnosis Date  . COPD (chronic obstructive pulmonary disease)   . Asthma   . GERD (gastroesophageal reflux disease)   . Hyperlipidemia   . Hypertension   . Atrial tachycardia   . Diverticulosis   . Insomnia   . Anxiety   . Neuropathy     Past Surgical History  Procedure Date  . Colonoscopy   . Removed cartliage rib   . Polypectomy   . Cervical polypectomy     INSIDE AND OUTSIDE    Family History  Problem Relation Age of Onset  . Colon polyps Sister 63    PRECANCEROUS POLYPS  . Breast cancer Maternal Grandmother 32  . Emphysema Father   . Asthma Father   . Heart disease Father   . Heart disease Paternal Grandfather     History  Substance Use Topics  . Smoking status: Former Smoker -- 1.0 packs/day for 20 years    Types: Cigarettes    Quit date: 05/21/1991  . Smokeless tobacco: Never Used  . Alcohol Use: No    OB History    Grav Para Term Preterm Abortions TAB SAB Ect Mult Living                  Review of Systems  All other systems reviewed and are negative.    Allergies  Food; Tequin; Codeine; Erythromycin; Wellbutrin; Penicillins; and Prednisone  Home Medications   Current Outpatient Rx  Name Route Sig Dispense Refill    . ALBUTEROL SULFATE HFA 108 (90 BASE) MCG/ACT IN AERS Inhalation Inhale into the lungs every 6 (six) hours as needed. Shortness of breath    . ASPIRIN 81 MG PO TABS Oral Take 81 mg by mouth daily.      Marland Kitchen BESIVANCE 0.6 % OP SUSP Both Eyes Place 1 drop into both eyes 4 (four) times daily as needed. Eye infection    . CALCIUM 1200 PO Oral Take 1 tablet by mouth daily. (slow release)    . DILTIAZEM HCL ER COATED BEADS 120 MG PO CP24 Oral Take 120 mg by mouth Daily.    Marland Kitchen FEXOFENADINE HCL 180 MG PO TABS Oral Take 180 mg by mouth daily.    Marland Kitchen FLUOXETINE HCL 20 MG PO CAPS Oral Take 20 mg by mouth Daily.    Marland Kitchen FLUTICASONE-SALMETEROL 250-50 MCG/DOSE IN AEPB Inhalation Inhale 1 puff into the lungs 2 (two) times daily. 60 each 11  . INDOMETHACIN 50 MG PO CAPS Oral Take 50 mg by mouth 2 (two) times daily as needed.      Marland Kitchen LORAZEPAM 0.5 MG PO TABS Oral Take 0.5-1 mg by mouth as needed. Anxiety    . LOSARTAN POTASSIUM 50 MG PO TABS Oral Take 50 mg by mouth Daily.    Elwin Sleight IN Inhalation Inhale 2  puffs into the lungs 2 (two) times daily.    Marland Kitchen MONTELUKAST SODIUM 10 MG PO TABS Oral Take 10 mg by mouth at bedtime. Once daily    . OCUVITE PO Oral Take 1 tablet by mouth daily.     Marland Kitchen OMEPRAZOLE 20 MG PO CPDR Oral Take 20 mg by mouth Nightly.      Marland Kitchen PRAVASTATIN SODIUM 40 MG PO TABS Oral Take 40 mg by mouth at bedtime.     Marland Kitchen TEMAZEPAM 30 MG PO CAPS Oral Take 1 tablet by mouth Nightly.      BP 156/81  Pulse 81  Temp 98 F (36.7 C)  Resp 16  SpO2 99%  Physical Exam  Nursing note and vitals reviewed. Constitutional: She appears well-developed and well-nourished. No distress.  Musculoskeletal: She exhibits tenderness. She exhibits no edema.       4 cm laceration to left posterior heel.  Laceration is deep to skin but does not involve deep structures.  No involvement in tendon  Skin: Skin is warm and dry. No rash noted. She is not diaphoretic. No erythema. No pallor.    ED Course  Procedures (including  critical care time)  Labs Reviewed - No data to display No results found. LACERATION REPAIR Performed by: Olivia Mackie Authorized by: Olivia Mackie Consent: Verbal consent obtained. Risks and benefits: risks, benefits and alternatives were discussed Consent given by: patient Patient identity confirmed: provided demographic data Prepped and Draped in normal sterile fashion Wound explored  Laceration Location: left heel  Laceration Length: 4 cm  No Foreign Bodies seen or palpated  Anesthesia: local infiltration  Local anesthetic: lidocaine 2% with epinephrine  Anesthetic total: 8 ml  Irrigation method: syringe Amount of cleaning: standard  Skin closure: 4.0 prolene  Number of sutures: 15  Technique: simple interrupted  Patient tolerance: Patient tolerated the procedure well with no immediate complications.  1. Laceration Of Heel       MDM  66 year old female with laceration to left heel. Laceration repaired with simple interrupted sutures. Patient instructed not to hyperextend the area as the laceration is under a slight bit of tension. There does not appear to be any involvement of the Achilles tendon. She is to followup with her primary care Dr. Precautions given for infection.        Olivia Mackie, MD 01/24/12 [id number]

## 2012-01-31 DIAGNOSIS — Z4802 Encounter for removal of sutures: Secondary | ICD-10-CM | POA: Diagnosis not present

## 2012-01-31 DIAGNOSIS — S91309A Unspecified open wound, unspecified foot, initial encounter: Secondary | ICD-10-CM | POA: Diagnosis not present

## 2012-01-31 DIAGNOSIS — G479 Sleep disorder, unspecified: Secondary | ICD-10-CM | POA: Diagnosis not present

## 2012-02-05 DIAGNOSIS — S91309A Unspecified open wound, unspecified foot, initial encounter: Secondary | ICD-10-CM | POA: Diagnosis not present

## 2012-02-05 DIAGNOSIS — L089 Local infection of the skin and subcutaneous tissue, unspecified: Secondary | ICD-10-CM | POA: Diagnosis not present

## 2012-02-07 DIAGNOSIS — Z4802 Encounter for removal of sutures: Secondary | ICD-10-CM | POA: Diagnosis not present

## 2012-02-07 DIAGNOSIS — S91309A Unspecified open wound, unspecified foot, initial encounter: Secondary | ICD-10-CM | POA: Diagnosis not present

## 2012-02-07 DIAGNOSIS — T148XXA Other injury of unspecified body region, initial encounter: Secondary | ICD-10-CM | POA: Diagnosis not present

## 2012-02-10 DIAGNOSIS — S91309A Unspecified open wound, unspecified foot, initial encounter: Secondary | ICD-10-CM | POA: Diagnosis not present

## 2012-02-10 DIAGNOSIS — T148XXA Other injury of unspecified body region, initial encounter: Secondary | ICD-10-CM | POA: Diagnosis not present

## 2012-02-10 DIAGNOSIS — Z4802 Encounter for removal of sutures: Secondary | ICD-10-CM | POA: Diagnosis not present

## 2012-02-10 DIAGNOSIS — L089 Local infection of the skin and subcutaneous tissue, unspecified: Secondary | ICD-10-CM | POA: Diagnosis not present

## 2012-02-17 ENCOUNTER — Encounter (INDEPENDENT_AMBULATORY_CARE_PROVIDER_SITE_OTHER): Payer: Medicare Other | Admitting: Ophthalmology

## 2012-02-17 DIAGNOSIS — I10 Essential (primary) hypertension: Secondary | ICD-10-CM

## 2012-02-17 DIAGNOSIS — H3530 Unspecified macular degeneration: Secondary | ICD-10-CM | POA: Diagnosis not present

## 2012-02-17 DIAGNOSIS — H43819 Vitreous degeneration, unspecified eye: Secondary | ICD-10-CM

## 2012-02-17 DIAGNOSIS — H353290 Exudative age-related macular degeneration, unspecified eye, stage unspecified: Secondary | ICD-10-CM

## 2012-02-17 DIAGNOSIS — H35039 Hypertensive retinopathy, unspecified eye: Secondary | ICD-10-CM

## 2012-02-18 DIAGNOSIS — S81009A Unspecified open wound, unspecified knee, initial encounter: Secondary | ICD-10-CM | POA: Diagnosis not present

## 2012-02-18 DIAGNOSIS — S81809A Unspecified open wound, unspecified lower leg, initial encounter: Secondary | ICD-10-CM | POA: Diagnosis not present

## 2012-02-18 DIAGNOSIS — S91009A Unspecified open wound, unspecified ankle, initial encounter: Secondary | ICD-10-CM | POA: Diagnosis not present

## 2012-02-20 DIAGNOSIS — Z23 Encounter for immunization: Secondary | ICD-10-CM | POA: Diagnosis not present

## 2012-02-20 DIAGNOSIS — R0989 Other specified symptoms and signs involving the circulatory and respiratory systems: Secondary | ICD-10-CM | POA: Diagnosis not present

## 2012-02-20 DIAGNOSIS — S91309A Unspecified open wound, unspecified foot, initial encounter: Secondary | ICD-10-CM | POA: Diagnosis not present

## 2012-02-20 DIAGNOSIS — J45909 Unspecified asthma, uncomplicated: Secondary | ICD-10-CM | POA: Diagnosis not present

## 2012-02-20 DIAGNOSIS — E785 Hyperlipidemia, unspecified: Secondary | ICD-10-CM | POA: Diagnosis not present

## 2012-02-20 DIAGNOSIS — F329 Major depressive disorder, single episode, unspecified: Secondary | ICD-10-CM | POA: Diagnosis not present

## 2012-02-20 DIAGNOSIS — I10 Essential (primary) hypertension: Secondary | ICD-10-CM | POA: Diagnosis not present

## 2012-02-27 DIAGNOSIS — R0989 Other specified symptoms and signs involving the circulatory and respiratory systems: Secondary | ICD-10-CM | POA: Diagnosis not present

## 2012-03-04 DIAGNOSIS — S81809A Unspecified open wound, unspecified lower leg, initial encounter: Secondary | ICD-10-CM | POA: Diagnosis not present

## 2012-03-04 DIAGNOSIS — S81009A Unspecified open wound, unspecified knee, initial encounter: Secondary | ICD-10-CM | POA: Diagnosis not present

## 2012-04-02 ENCOUNTER — Encounter (INDEPENDENT_AMBULATORY_CARE_PROVIDER_SITE_OTHER): Payer: Medicare Other | Admitting: Ophthalmology

## 2012-04-02 DIAGNOSIS — H35039 Hypertensive retinopathy, unspecified eye: Secondary | ICD-10-CM

## 2012-04-02 DIAGNOSIS — I10 Essential (primary) hypertension: Secondary | ICD-10-CM

## 2012-04-02 DIAGNOSIS — H353290 Exudative age-related macular degeneration, unspecified eye, stage unspecified: Secondary | ICD-10-CM

## 2012-04-02 DIAGNOSIS — H2510 Age-related nuclear cataract, unspecified eye: Secondary | ICD-10-CM

## 2012-04-02 DIAGNOSIS — H3530 Unspecified macular degeneration: Secondary | ICD-10-CM

## 2012-04-02 DIAGNOSIS — H43819 Vitreous degeneration, unspecified eye: Secondary | ICD-10-CM

## 2012-05-28 ENCOUNTER — Encounter (INDEPENDENT_AMBULATORY_CARE_PROVIDER_SITE_OTHER): Payer: Medicare Other | Admitting: Ophthalmology

## 2012-05-28 DIAGNOSIS — H3530 Unspecified macular degeneration: Secondary | ICD-10-CM | POA: Diagnosis not present

## 2012-05-28 DIAGNOSIS — H353290 Exudative age-related macular degeneration, unspecified eye, stage unspecified: Secondary | ICD-10-CM

## 2012-05-28 DIAGNOSIS — H2510 Age-related nuclear cataract, unspecified eye: Secondary | ICD-10-CM

## 2012-05-28 DIAGNOSIS — H35039 Hypertensive retinopathy, unspecified eye: Secondary | ICD-10-CM

## 2012-05-28 DIAGNOSIS — I10 Essential (primary) hypertension: Secondary | ICD-10-CM

## 2012-05-28 DIAGNOSIS — H43819 Vitreous degeneration, unspecified eye: Secondary | ICD-10-CM

## 2012-07-13 ENCOUNTER — Encounter: Payer: Self-pay | Admitting: Emergency Medicine

## 2012-07-13 ENCOUNTER — Ambulatory Visit (INDEPENDENT_AMBULATORY_CARE_PROVIDER_SITE_OTHER): Payer: Medicare Other | Admitting: Emergency Medicine

## 2012-07-13 ENCOUNTER — Telehealth: Payer: Self-pay | Admitting: Emergency Medicine

## 2012-07-13 VITALS — BP 140/84 | HR 106 | Temp 98.6°F | Ht 63.0 in | Wt 206.2 lb

## 2012-07-13 DIAGNOSIS — J449 Chronic obstructive pulmonary disease, unspecified: Secondary | ICD-10-CM

## 2012-07-13 DIAGNOSIS — J309 Allergic rhinitis, unspecified: Secondary | ICD-10-CM

## 2012-07-13 DIAGNOSIS — J4489 Other specified chronic obstructive pulmonary disease: Secondary | ICD-10-CM

## 2012-07-13 NOTE — Telephone Encounter (Signed)
Spoke with patient and relayed recipe for the Nasal Saline rinse.  1 qt of water--2-3 heaping tsp sea salt--1 tsp [employer] baking soda.  Nothing further needed.

## 2012-07-13 NOTE — Patient Instructions (Addendum)
Continue your generic zyrtec. You could consider going back on allegra if you believe this works better [employer] Continue your Advair Follow with Dr Delton Coombes in 4 months or sooner if you have any problems.

## 2012-07-13 NOTE — Progress Notes (Signed)
  Subjective:    Patient ID: [name] Molina, female    DOB: 24-Jan-1946, 67 y.o.   MRN: [id number] HPI 67 yo woman, hx former tobacco and childhood asthma. Formerly followed by Dr Sung Amabile for mild COPD. Also hx GERD, HTN.  She was managed on Advair for years, but now that she is on Medicare she is looking for a less expensive option. She stopped the Advair in November.  Also, since September she has had much more trouble with nasal gtt, throat irritation, cough, throat clearing. More SABA use.   ROV 07/22/11 -- OSA/OHS, COPD, allergies and chronic cough.  Continued singulair, restarted allegra and added Qnasl. She is taking symbicort bid, needs to get financial assistance w this. Her nasal gtt is a lot better now, in fact her nose is dry on the Qnasl. Her breathing seems to be better also.   ROV 09/09/11 -- OSA/OHS, COPD, allergies and cough. We stopped Qnasl due to dryness, tried stopping Symbicort but needed to restart it. She has since run out and is ready to start Dulera 1 puff bid, possibly uptitrate to 2 puffs bid. She is on Freight forwarder. No exacerbations since last time   ROV 01/07/12 -- OSA/OHS, COPD, allergies and cough.  She had been on Dulera due to cost, but now wants to go back to Advair - same cost. She believes she can stop the singulair if she is back on Advair. She is benefiting from [employer].    ROV 07/13/12 -- OSA/OHS, COPD, allergies and cough. Returns today for f/u - we changed Dulera back to Advair last visit (started 10/13). She had been doing well until about a week ago when she started having nasal gtt, some scratchy voice, occas cough. She stopped singulair.       Objective:   Physical Exam Filed Vitals:   07/13/12 1414  BP: 140/84  Pulse: 106  Temp: 98.6 F (37 C)   Gen: Pleasant, well-nourished, in no distress,  normal affect  ENT: No lesions,  mouth clear,  oropharynx clear, no postnasal drip  Neck: No JVD, no TMG, no carotid bruits  Lungs: No use of  accessory muscles, no dullness to percussion, clear without rales or rhonchi  Cardiovascular: RRR, heart sounds normal, no murmur or gallops, no peripheral edema  Musculoskeletal: No deformities, no cyanosis or clubbing  Neuro: alert, non focal  Skin: Warm, no lesions or rashes     Assessment & Plan:  COPD advair bid Albuterol prn  Allergic rhinitis Unclear whether her current sx are a URI vs the beginnings of her allergies for the Spring season.  - add back singulair to zyrtec (or allegra) - consider start NSW's - rov 4 mon

## 2012-07-13 NOTE — Assessment & Plan Note (Signed)
advair bid Albuterol prn

## 2012-07-13 NOTE — Assessment & Plan Note (Signed)
Unclear whether her current sx are a URI vs the beginnings of her allergies for the Spring season.  - add back singulair to zyrtec (or allegra) - consider start NSW's - rov 4 mon

## 2012-07-16 ENCOUNTER — Encounter (INDEPENDENT_AMBULATORY_CARE_PROVIDER_SITE_OTHER): Payer: Medicare Other | Admitting: Ophthalmology

## 2012-07-16 DIAGNOSIS — H3530 Unspecified macular degeneration: Secondary | ICD-10-CM

## 2012-07-16 DIAGNOSIS — H43819 Vitreous degeneration, unspecified eye: Secondary | ICD-10-CM

## 2012-07-16 DIAGNOSIS — H2510 Age-related nuclear cataract, unspecified eye: Secondary | ICD-10-CM

## 2012-07-16 DIAGNOSIS — I10 Essential (primary) hypertension: Secondary | ICD-10-CM

## 2012-07-16 DIAGNOSIS — H35039 Hypertensive retinopathy, unspecified eye: Secondary | ICD-10-CM

## 2012-07-16 DIAGNOSIS — H353290 Exudative age-related macular degeneration, unspecified eye, stage unspecified: Secondary | ICD-10-CM

## 2012-08-20 DIAGNOSIS — I10 Essential (primary) hypertension: Secondary | ICD-10-CM | POA: Diagnosis not present

## 2012-08-20 DIAGNOSIS — F329 Major depressive disorder, single episode, unspecified: Secondary | ICD-10-CM | POA: Diagnosis not present

## 2012-08-20 DIAGNOSIS — E785 Hyperlipidemia, unspecified: Secondary | ICD-10-CM | POA: Diagnosis not present

## 2012-08-20 DIAGNOSIS — J40 Bronchitis, not specified as acute or chronic: Secondary | ICD-10-CM | POA: Diagnosis not present

## 2012-09-17 ENCOUNTER — Encounter (INDEPENDENT_AMBULATORY_CARE_PROVIDER_SITE_OTHER): Payer: Medicare Other | Admitting: Ophthalmology

## 2012-09-17 DIAGNOSIS — I10 Essential (primary) hypertension: Secondary | ICD-10-CM | POA: Diagnosis not present

## 2012-09-17 DIAGNOSIS — H43819 Vitreous degeneration, unspecified eye: Secondary | ICD-10-CM

## 2012-09-17 DIAGNOSIS — H353290 Exudative age-related macular degeneration, unspecified eye, stage unspecified: Secondary | ICD-10-CM

## 2012-09-17 DIAGNOSIS — H3530 Unspecified macular degeneration: Secondary | ICD-10-CM | POA: Diagnosis not present

## 2012-09-17 DIAGNOSIS — H35039 Hypertensive retinopathy, unspecified eye: Secondary | ICD-10-CM | POA: Diagnosis not present

## 2012-09-17 DIAGNOSIS — H2510 Age-related nuclear cataract, unspecified eye: Secondary | ICD-10-CM

## 2012-11-12 ENCOUNTER — Ambulatory Visit (INDEPENDENT_AMBULATORY_CARE_PROVIDER_SITE_OTHER): Payer: Medicare Other | Admitting: Emergency Medicine

## 2012-11-12 ENCOUNTER — Encounter: Payer: Self-pay | Admitting: Emergency Medicine

## 2012-11-12 VITALS — BP 122/80 | HR 84 | Temp 97.8°F | Ht 63.0 in | Wt 207.6 lb

## 2012-11-12 DIAGNOSIS — J449 Chronic obstructive pulmonary disease, unspecified: Secondary | ICD-10-CM | POA: Diagnosis not present

## 2012-11-12 DIAGNOSIS — J309 Allergic rhinitis, unspecified: Secondary | ICD-10-CM | POA: Diagnosis not present

## 2012-11-12 NOTE — Assessment & Plan Note (Signed)
-   fexofenadine +/- singulair during the allergy seasons.

## 2012-11-12 NOTE — Progress Notes (Signed)
  Subjective:    Patient ID: [name] Molina, female    DOB: 06-13-1945, 67 y.o.   MRN: [id number] HPI 67 yo woman, hx former tobacco and childhood asthma. Formerly followed by Dr Sung Amabile for mild COPD. Also hx GERD, HTN.  She was managed on Advair for years, but now that she is on Medicare she is looking for a less expensive option. She stopped the Advair in November.  Also, since September she has had much more trouble with nasal gtt, throat irritation, cough, throat clearing. More SABA use.   ROV 07/22/11 -- OSA/OHS, COPD, allergies and chronic cough.  Continued singulair, restarted allegra and added Qnasl. She is taking symbicort bid, needs to get financial assistance w this. Her nasal gtt is a lot better now, in fact her nose is dry on the Qnasl. Her breathing seems to be better also.   ROV 09/09/11 -- OSA/OHS, COPD, allergies and cough. We stopped Qnasl due to dryness, tried stopping Symbicort but needed to restart it. She has since run out and is ready to start Dulera 1 puff bid, possibly uptitrate to 2 puffs bid. She is on Freight forwarder. No exacerbations since last time   ROV 01/07/12 -- OSA/OHS, COPD, allergies and cough.  She had been on Dulera due to cost, but now wants to go back to Advair - same cost. She believes she can stop the singulair if she is back on Advair. She is benefiting from [employer].    ROV 07/13/12 -- OSA/OHS, COPD, allergies and cough. Returns today for f/u - we changed Dulera back to Advair last visit (started 10/13). She had been doing well until about a week ago when she started having nasal gtt, some scratchy voice, occas cough. She stopped singulair.   ROV 11/13/12 -- OSA/OHS, COPD, allergies and cough.  She took singulair for a while last time, now is off of it. She is taking generic allegra. Her allergies are well controlled, cough is better. Uses albuterol rarely. No flares since last time.       Objective:   Physical Exam Filed Vitals:   11/12/12 1442  BP:  122/80  Pulse: 84  Temp: 97.8 F (36.6 C)   Gen: Pleasant, well-nourished, in no distress,  normal affect  ENT: No lesions,  mouth clear,  oropharynx clear, no postnasal drip  Neck: No JVD, no TMG, no carotid bruits  Lungs: No use of accessory muscles, no dullness to percussion, clear without rales or rhonchi  Cardiovascular: RRR, heart sounds normal, no murmur or gallops, no peripheral edema  Musculoskeletal: No deformities, no cyanosis or clubbing  Neuro: alert, non focal  Skin: Warm, no lesions or rashes     Assessment & Plan:  COPD Currently stable - continue BD regimen - flu shot in Fall - discussed the new PNA vaccine w her; will revisit next time  Allergic rhinitis - fexofenadine +/- singulair during the allergy seasons.

## 2012-11-12 NOTE — Patient Instructions (Addendum)
Please continue your Advair twice a day Use albuterol 2 puffs as needed Take your allegra through the allergy season. You may need to restart this and singulair this Fall.  Follow with Dr Delton Coombes in 6 months or sooner if you have any problems

## 2012-11-12 NOTE — Assessment & Plan Note (Signed)
Currently stable - continue BD regimen - flu shot in Fall - discussed the new PNA vaccine w her; will revisit next time

## 2012-11-26 ENCOUNTER — Encounter (INDEPENDENT_AMBULATORY_CARE_PROVIDER_SITE_OTHER): Payer: Medicare Other | Admitting: Ophthalmology

## 2012-11-26 DIAGNOSIS — H35039 Hypertensive retinopathy, unspecified eye: Secondary | ICD-10-CM

## 2012-11-26 DIAGNOSIS — H353290 Exudative age-related macular degeneration, unspecified eye, stage unspecified: Secondary | ICD-10-CM

## 2012-11-26 DIAGNOSIS — H3530 Unspecified macular degeneration: Secondary | ICD-10-CM

## 2012-11-26 DIAGNOSIS — I10 Essential (primary) hypertension: Secondary | ICD-10-CM

## 2012-11-26 DIAGNOSIS — H43819 Vitreous degeneration, unspecified eye: Secondary | ICD-10-CM

## 2013-01-21 ENCOUNTER — Encounter (INDEPENDENT_AMBULATORY_CARE_PROVIDER_SITE_OTHER): Payer: Medicare Other | Admitting: Ophthalmology

## 2013-01-21 DIAGNOSIS — H35039 Hypertensive retinopathy, unspecified eye: Secondary | ICD-10-CM | POA: Diagnosis not present

## 2013-01-21 DIAGNOSIS — H2510 Age-related nuclear cataract, unspecified eye: Secondary | ICD-10-CM

## 2013-01-21 DIAGNOSIS — H3530 Unspecified macular degeneration: Secondary | ICD-10-CM | POA: Diagnosis not present

## 2013-01-21 DIAGNOSIS — H43819 Vitreous degeneration, unspecified eye: Secondary | ICD-10-CM

## 2013-01-21 DIAGNOSIS — H353290 Exudative age-related macular degeneration, unspecified eye, stage unspecified: Secondary | ICD-10-CM

## 2013-01-21 DIAGNOSIS — I10 Essential (primary) hypertension: Secondary | ICD-10-CM

## 2013-01-29 ENCOUNTER — Encounter: Payer: Self-pay | Admitting: Pulmonary Disease

## 2013-01-29 ENCOUNTER — Ambulatory Visit (INDEPENDENT_AMBULATORY_CARE_PROVIDER_SITE_OTHER): Payer: Medicare Other | Admitting: Pulmonary Disease

## 2013-01-29 VITALS — BP 132/92 | HR 98 | Temp 98.4°F | Ht 63.25 in | Wt 209.8 lb

## 2013-01-29 DIAGNOSIS — J441 Chronic obstructive pulmonary disease with (acute) exacerbation: Secondary | ICD-10-CM | POA: Diagnosis not present

## 2013-01-29 DIAGNOSIS — J449 Chronic obstructive pulmonary disease, unspecified: Secondary | ICD-10-CM

## 2013-01-29 DIAGNOSIS — J209 Acute bronchitis, unspecified: Secondary | ICD-10-CM | POA: Diagnosis not present

## 2013-01-29 MED ORDER — DOXYCYCLINE HYCLATE 100 MG PO TABS: 100.0000 mg | ORAL_TABLET | Freq: Two times a day (BID) | ORAL | Status: DC

## 2013-01-29 MED ORDER — METHYLPREDNISOLONE 16 MG PO TABS: ORAL_TABLET | ORAL | Status: DC

## 2013-01-29 NOTE — Assessment & Plan Note (Signed)
The patient is describing a 3 to four-day history of increasing chest congestion, cough with purulent mucus, and increasing shortness of breath.  This may represent a viral process, but she feels that she is getting worse and has underlying COPD.  We'll therefore treat her with an antibiotic.  She is allergic to penicillin, Tequin, and therefore our antibiotic choices are limited

## 2013-01-29 NOTE — Patient Instructions (Addendum)
Get back on advair. Will start on doxycycline 100mg  one in am and pm for next 5 days Medrol in a tapering dose as prescribed mucinex dm am and pm for next week until better Please call if you are not improving.

## 2013-01-29 NOTE — Assessment & Plan Note (Signed)
The patient has had increased shortness of breath, and has wheezing on exam.  We'll therefore treat her with a short course of Medrol given her intolerance to prednisone in the past.

## 2013-01-29 NOTE — Progress Notes (Signed)
  Subjective:    Patient ID: [name] Molina, female    DOB: [birth year], 67 y.o.   MRN: [id number]  HPI Patient comes in today for an acute sick visit.  She has known COPD, and gives a 3 to four-day history of increasing chest congestion, cough with purulent mucus, and increased shortness of breath with wheezing.  She has not had any fevers, chills, or sweats.  She has had lot of postnasal drip, but no purulence from her nose.   Review of Systems  Constitutional: Positive for fever, chills, diaphoresis and fatigue. Negative for unexpected weight change.  HENT: Positive for congestion, sore throat, rhinorrhea, voice change and postnasal drip. Negative for ear pain, nosebleeds, sneezing, trouble swallowing, dental problem and sinus pressure.   Eyes: Negative for redness and itching.  Respiratory: Positive for cough, shortness of breath and wheezing. Negative for chest tightness.   Cardiovascular: Negative for palpitations and leg swelling.  Gastrointestinal: Negative for nausea and vomiting.  Genitourinary: Negative for dysuria.  Musculoskeletal: Negative for joint swelling.  Skin: Negative for rash.  Neurological: Negative for headaches.  Hematological: Does not bruise/bleed easily.  Psychiatric/Behavioral: Negative for dysphoric mood. The patient is not nervous/anxious.        Objective:   Physical Exam Obese female in no acute distress Nose without purulence or discharge noted Neck without lymphadenopathy or thyromegaly Chest with a few rhonchi, wheezes in the right base, no crackles Cardiac exam with regular rate and rhythm Lower extremities with mild edema, no cyanosis Alert and oriented, moves all 4 extremities       Assessment & Plan:

## 2013-01-29 NOTE — Addendum Note (Signed)
Addended by: Orma Flaming D on: 01/29/2013 02:39 PM   Modules accepted: Orders

## 2013-02-18 ENCOUNTER — Other Ambulatory Visit: Payer: Self-pay | Admitting: Family Medicine

## 2013-02-18 ENCOUNTER — Ambulatory Visit
Admission: RE | Admit: 2013-02-18 | Discharge: 2013-02-18 | Disposition: A | Discharge status: Home / Self Care | Payer: Medicare Other | Source: Ambulatory Visit | Attending: Family Medicine | Admitting: Family Medicine

## 2013-02-18 DIAGNOSIS — I10 Essential (primary) hypertension: Secondary | ICD-10-CM | POA: Diagnosis not present

## 2013-02-18 DIAGNOSIS — R1011 Right upper quadrant pain: Secondary | ICD-10-CM | POA: Diagnosis not present

## 2013-02-18 DIAGNOSIS — K7689 Other specified diseases of liver: Secondary | ICD-10-CM | POA: Diagnosis not present

## 2013-02-18 DIAGNOSIS — E785 Hyperlipidemia, unspecified: Secondary | ICD-10-CM | POA: Diagnosis not present

## 2013-02-18 DIAGNOSIS — Z0000 Encounter for general adult medical examination without abnormal findings: Secondary | ICD-10-CM | POA: Diagnosis not present

## 2013-02-18 DIAGNOSIS — R109 Unspecified abdominal pain: Secondary | ICD-10-CM | POA: Diagnosis not present

## 2013-02-18 DIAGNOSIS — E049 Nontoxic goiter, unspecified: Secondary | ICD-10-CM | POA: Diagnosis not present

## 2013-02-18 DIAGNOSIS — N3000 Acute cystitis without hematuria: Secondary | ICD-10-CM | POA: Diagnosis not present

## 2013-02-18 DIAGNOSIS — Z23 Encounter for immunization: Secondary | ICD-10-CM | POA: Diagnosis not present

## 2013-02-18 DIAGNOSIS — F329 Major depressive disorder, single episode, unspecified: Secondary | ICD-10-CM | POA: Diagnosis not present

## 2013-03-25 ENCOUNTER — Encounter (INDEPENDENT_AMBULATORY_CARE_PROVIDER_SITE_OTHER): Payer: Medicare Other | Admitting: Ophthalmology

## 2013-03-25 DIAGNOSIS — H353290 Exudative age-related macular degeneration, unspecified eye, stage unspecified: Secondary | ICD-10-CM | POA: Diagnosis not present

## 2013-03-25 DIAGNOSIS — H43819 Vitreous degeneration, unspecified eye: Secondary | ICD-10-CM

## 2013-03-25 DIAGNOSIS — I10 Essential (primary) hypertension: Secondary | ICD-10-CM

## 2013-03-25 DIAGNOSIS — H3530 Unspecified macular degeneration: Secondary | ICD-10-CM

## 2013-03-25 DIAGNOSIS — H35039 Hypertensive retinopathy, unspecified eye: Secondary | ICD-10-CM

## 2013-03-25 DIAGNOSIS — H2510 Age-related nuclear cataract, unspecified eye: Secondary | ICD-10-CM

## 2013-04-01 DIAGNOSIS — R609 Edema, unspecified: Secondary | ICD-10-CM | POA: Diagnosis not present

## 2013-04-01 DIAGNOSIS — M722 Plantar fascial fibromatosis: Secondary | ICD-10-CM | POA: Diagnosis not present

## 2013-04-12 DIAGNOSIS — B009 Herpesviral infection, unspecified: Secondary | ICD-10-CM | POA: Diagnosis not present

## 2013-04-12 DIAGNOSIS — J069 Acute upper respiratory infection, unspecified: Secondary | ICD-10-CM | POA: Diagnosis not present

## 2013-04-12 DIAGNOSIS — J40 Bronchitis, not specified as acute or chronic: Secondary | ICD-10-CM | POA: Diagnosis not present

## 2013-05-19 ENCOUNTER — Encounter (INDEPENDENT_AMBULATORY_CARE_PROVIDER_SITE_OTHER): Payer: Medicare Other | Admitting: Ophthalmology

## 2013-05-19 DIAGNOSIS — H43819 Vitreous degeneration, unspecified eye: Secondary | ICD-10-CM

## 2013-05-19 DIAGNOSIS — H353290 Exudative age-related macular degeneration, unspecified eye, stage unspecified: Secondary | ICD-10-CM | POA: Diagnosis not present

## 2013-05-19 DIAGNOSIS — I10 Essential (primary) hypertension: Secondary | ICD-10-CM

## 2013-05-19 DIAGNOSIS — H35039 Hypertensive retinopathy, unspecified eye: Secondary | ICD-10-CM

## 2013-05-19 DIAGNOSIS — H3530 Unspecified macular degeneration: Secondary | ICD-10-CM

## 2013-06-04 ENCOUNTER — Encounter (INDEPENDENT_AMBULATORY_CARE_PROVIDER_SITE_OTHER): Payer: Self-pay

## 2013-06-04 ENCOUNTER — Encounter: Payer: Self-pay | Admitting: Emergency Medicine

## 2013-06-04 ENCOUNTER — Ambulatory Visit (INDEPENDENT_AMBULATORY_CARE_PROVIDER_SITE_OTHER): Payer: Medicare Other | Admitting: Emergency Medicine

## 2013-06-04 VITALS — BP 130/74 | HR 93 | Ht 63.0 in | Wt 208.8 lb

## 2013-06-04 DIAGNOSIS — J449 Chronic obstructive pulmonary disease, unspecified: Secondary | ICD-10-CM | POA: Diagnosis not present

## 2013-06-04 MED ORDER — ALBUTEROL SULFATE HFA 108 (90 BASE) MCG/ACT IN AERS: 1.0000 | INHALATION_SPRAY | Freq: Four times a day (QID) | RESPIRATORY_TRACT | Status: DC | PRN

## 2013-06-04 NOTE — Patient Instructions (Signed)
Please continue Advair  Use albuterol as needed Continue your allergy medicine - either generic zyrtec or fexofenadine You may want to restart singulair in the Spring and Fall Allergy seasons.  Follow with Dr Lamonte Sakai in 6 months or sooner if you have any problems

## 2013-06-04 NOTE — Progress Notes (Signed)
  Subjective:    Patient ID: [name] Molina, female    DOB: December 02, 1945, 68 y.o.   MRN: [id number] HPI 68 yo woman, hx former tobacco and childhood asthma. Formerly followed by Dr Alva Garnet for mild COPD. Also hx GERD, HTN.  She was managed on Advair for years, but now that she is on Medicare she is looking for a less expensive option. She stopped the Advair in November.  Also, since September she has had much more trouble with nasal gtt, throat irritation, cough, throat clearing. More SABA use.   ROV 69/4/13 -- OSA/OHS, COPD, allergies and chronic cough.  Continued singulair, restarted allegra and added Qnasl. She is taking symbicort bid, needs to get financial assistance w this. Her nasal gtt is a lot better now, in fact her nose is dry on the Qnasl. Her breathing seems to be better also.   ROV 09/09/11 -- OSA/OHS, COPD, allergies and cough. We stopped Qnasl due to dryness, tried stopping Symbicort but needed to restart it. She has since run out and is ready to start Dulera 1 puff bid, possibly uptitrate to 2 puffs bid. She is on Nature conservation officer. No exacerbations since last time   ROV 01/07/12 -- OSA/OHS, COPD, allergies and cough.  She had been on Dulera due to cost, but now wants to go back to Advair - same cost. She believes she can stop the singulair if she is back on Advair. She is benefiting from [employer].    ROV 07/13/12 -- OSA/OHS, COPD, allergies and cough. Returns today for f/u - we changed Dulera back to Advair last visit (started 10/13). She had been doing well until about a week ago when she started having nasal gtt, some scratchy voice, occas cough. She stopped singulair.   ROV 11/13/12 -- OSA/OHS, COPD, allergies and cough.  She took singulair for a while last time, now is off of it. She is taking generic allegra. Her allergies are well controlled, cough is better. Uses albuterol rarely. No flares since last time.   ROV 06/04/13 -- OSA/OHS, COPD, allergies and cough. She had a URI in 9/'14  and was treated for an AE by Dr Gwenette Greet.  She is on Advair + albuterol prn, uses rarely. She is taking generic zyrtec right now (not fexofenadine). Not on singulair. No other flares. Has been active.      Objective:   Physical Exam Filed Vitals:   06/04/13 1049  BP: 130/74  Pulse: 93  Height: 5\' 3"  (1.6 m)  Weight: 208 lb 12.8 oz (94.711 kg)  SpO2: 96%   Gen: Pleasant, well-nourished, in no distress,  normal affect  ENT: No lesions,  mouth clear,  oropharynx clear, no postnasal drip  Neck: No JVD, no TMG, no carotid bruits  Lungs: No use of accessory muscles, no dullness to percussion, clear without rales or rhonchi  Cardiovascular: RRR, heart sounds normal, no murmur or gallops, no peripheral edema  Musculoskeletal: No deformities, no cyanosis or clubbing  Neuro: alert, non focal  Skin: Warm, no lesions or rashes     Assessment & Plan:  COPD Please continue Advair  Use albuterol as needed Continue your allergy medicine - either generic zyrtec or fexofenadine You may want to restart singulair in the Spring and Fall Allergy seasons.  Follow with Dr Lamonte Sakai in 6 months or sooner if you have any problem

## 2013-06-04 NOTE — Assessment & Plan Note (Signed)
Please continue Advair  Use albuterol as needed Continue your allergy medicine - either generic zyrtec or fexofenadine You may want to restart singulair in the Spring and Fall Allergy seasons.  Follow with Dr Lamonte Sakai in 6 months or sooner if you have any problem

## 2013-06-30 ENCOUNTER — Encounter (INDEPENDENT_AMBULATORY_CARE_PROVIDER_SITE_OTHER): Payer: Medicare Other | Admitting: Ophthalmology

## 2013-06-30 DIAGNOSIS — H43819 Vitreous degeneration, unspecified eye: Secondary | ICD-10-CM

## 2013-06-30 DIAGNOSIS — H35039 Hypertensive retinopathy, unspecified eye: Secondary | ICD-10-CM

## 2013-06-30 DIAGNOSIS — H3530 Unspecified macular degeneration: Secondary | ICD-10-CM | POA: Diagnosis not present

## 2013-06-30 DIAGNOSIS — I10 Essential (primary) hypertension: Secondary | ICD-10-CM | POA: Diagnosis not present

## 2013-06-30 DIAGNOSIS — H353290 Exudative age-related macular degeneration, unspecified eye, stage unspecified: Secondary | ICD-10-CM

## 2013-07-27 ENCOUNTER — Telehealth: Payer: Self-pay | Admitting: Pulmonary Disease

## 2013-07-27 ENCOUNTER — Ambulatory Visit (INDEPENDENT_AMBULATORY_CARE_PROVIDER_SITE_OTHER): Payer: Medicare Other | Admitting: Pulmonary Disease

## 2013-07-27 ENCOUNTER — Ambulatory Visit (INDEPENDENT_AMBULATORY_CARE_PROVIDER_SITE_OTHER)
Admission: RE | Admit: 2013-07-27 | Discharge: 2013-07-27 | Disposition: A | Discharge status: Home / Self Care | Payer: Medicare Other | Source: Ambulatory Visit | Attending: Pulmonary Disease | Admitting: Pulmonary Disease

## 2013-07-27 ENCOUNTER — Encounter: Payer: Self-pay | Admitting: Pulmonary Disease

## 2013-07-27 VITALS — BP 122/74 | HR 105 | Temp 100.5°F | Ht 63.0 in | Wt 207.0 lb

## 2013-07-27 DIAGNOSIS — J111 Influenza due to unidentified influenza virus with other respiratory manifestations: Secondary | ICD-10-CM | POA: Insufficient documentation

## 2013-07-27 DIAGNOSIS — J449 Chronic obstructive pulmonary disease, unspecified: Secondary | ICD-10-CM

## 2013-07-27 DIAGNOSIS — J40 Bronchitis, not specified as acute or chronic: Secondary | ICD-10-CM | POA: Diagnosis not present

## 2013-07-27 MED ORDER — LEVOFLOXACIN 500 MG PO TABS: 500.0000 mg | ORAL_TABLET | Freq: Every day | ORAL | Status: DC

## 2013-07-27 NOTE — Telephone Encounter (Signed)
Dr. Elsworth Soho please advise regarding results? thanks

## 2013-07-27 NOTE — Patient Instructions (Signed)
You likely have the flu - could have turned into pneumonia CXR now Levaquin 500 daily x 7days Take albuterol 2 puffs thrice daily until better If worse, may have to go to ER [place] of oral fluids

## 2013-07-27 NOTE — Progress Notes (Signed)
   Subjective:    Patient ID: RONEKA GILPIN, female    DOB: Nov 29, 1945, 68 y.o.   MRN: [id number]  HPI   68 yo woman, hx former tobacco and childhood asthma. With mildCOPD. Also hx GERD, HTN.   ROV 06/04/13 -- OSA/OHS, COPD, allergies and cough. She had a URI in 9/'14 and was treated for an AE by Dr Gwenette Greet. She is on Advair + albuterol prn, uses rarely. She is taking generic zyrtec right now (not fexofenadine). Not on singulair. No other flares. Has been active.   07/27/2013  Chief Complaint  Patient presents with  . Acute Visit    [place] pt.  Has been on tamiflu X1 week, c/o chills, fever, back pain, SOB.     Daughter had the flu a week ago- given tamiflu prophylaxis, she was also febrile, improved now sick again with fever, chills, myalgias & dry cough Mild increased dyspnea 'allergic ' to prednisone - causes rash CXR - no new infx   Past Medical History  Diagnosis Date  . COPD (chronic obstructive pulmonary disease)   . Asthma   . GERD (gastroesophageal reflux disease)   . Hyperlipidemia   . Hypertension   . Atrial tachycardia   . Diverticulosis   . Insomnia   . Anxiety   . Neuropathy      Review of Systems neg for any significant sore throat, dysphagia, itching, sneezing, nasal congestion or excess/ purulent secretions, fever, chills, sweats, unintended wt loss, pleuritic or exertional cp, hempoptysis, orthopnea pnd or change in chronic leg swelling. Also denies presyncope, palpitations, heartburn, abdominal pain, nausea, vomiting, diarrhea or change in bowel or urinary habits, dysuria,hematuria, rash, arthralgias, visual complaints, headache, numbness weakness or ataxia.     Objective:   Physical Exam  Gen. Pleasant, well-nourished, in no distress, normal affect ENT - no lesions, no post nasal drip Neck: No JVD, no thyromegaly, no carotid bruits Lungs: no use of accessory muscles, no dullness to percussion, RLL rales, faint exp rhonchi  Cardiovascular: Rhythm  regular, heart sounds  normal, no murmurs or gallops, no peripheral edema Abdomen: soft and non-tender, no hepatosplenomegaly, BS normal. Musculoskeletal: No deformities, no cyanosis or clubbing Neuro:  alert, non focal       Assessment & Plan:

## 2013-07-27 NOTE — Assessment & Plan Note (Signed)
Treat as flare Take albuterol 2 puffs thrice daily until better, try to avoid prednisone here If worse, may have to go to ER

## 2013-07-27 NOTE — Assessment & Plan Note (Signed)
You likely have the flu - could have turned into pneumonia CXR no evidence of infiltrate Levaquin 500 daily x 7days  Plenty of oral fluids

## 2013-07-27 NOTE — Telephone Encounter (Signed)
I spoke with patient about results and she verbalized understanding and had no questions 

## 2013-07-27 NOTE — Telephone Encounter (Signed)
No pna

## 2013-07-30 ENCOUNTER — Encounter: Payer: Self-pay | Admitting: Adult Health

## 2013-07-30 ENCOUNTER — Ambulatory Visit (INDEPENDENT_AMBULATORY_CARE_PROVIDER_SITE_OTHER): Payer: Medicare Other | Admitting: Adult Health

## 2013-07-30 VITALS — BP 144/66 | HR 110 | Temp 98.1°F | Ht 63.0 in | Wt 207.4 lb

## 2013-07-30 DIAGNOSIS — J111 Influenza due to unidentified influenza virus with other respiratory manifestations: Secondary | ICD-10-CM

## 2013-07-30 NOTE — Patient Instructions (Signed)
Finish Levaquin.  Push Fluids .  Advance diet as tolerated.  Tylenol for fever As needed   Mucinex DM Twice daily  As needed  Cough/congestion  Follow up Dr. Lamonte Sakai  In 2-3 weeks and As needed   Please contact office for sooner follow up if symptoms do not improve or worsen or seek emergency care

## 2013-08-02 NOTE — Progress Notes (Signed)
   Subjective:    Patient ID: [name] Molina, female    DOB: 1945-11-19, 68 y.o.   MRN: [id number]  HPI  68 yo woman, hx former tobacco and childhood asthma. With mildCOPD. Also hx GERD, HTN.   ROV 06/04/13 -- OSA/OHS, COPD, allergies and cough. She had a URI in 9/'14 and was treated for an AE by Dr Gwenette Greet. She is on Advair + albuterol prn, uses rarely. She is taking generic zyrtec right now (not fexofenadine). Not on singulair. No other flares. Has been active.   07/27/13  Chief Complaint  Patient presents with  . Acute Visit    RA pt seen 3/10 - reports she does feel slightly better, but would like to discuss progress  Daughter had the flu a week ago- given tamiflu prophylaxis, she was also febrile, improved now sick again with fever, chills, myalgias & dry cough Mild increased dyspnea 'allergic ' to prednisone - causes rash CXR - no new infx >Levaquin rx   07/30/13 Acute OV  Returns for persistent symptoms.  Reports she does feel slightly better, but would like to discuss progress.  She was seen 3 days ago with suspected influenza /bronchitis . CXR w/ out acute processs.  She was started on Levaquin . Says cough and congestion are some better. She still feels weak, no energy  Has low grade fevers. Is finishing Tamiflu .  Marland Kitchen Has dry cough. No  hemoptysis , chest pain, orthopnea, edema.  Appetite is still poor.  No n/v.    Past Medical History  Diagnosis Date  . COPD (chronic obstructive pulmonary disease)   . Asthma   . GERD (gastroesophageal reflux disease)   . Hyperlipidemia   . Hypertension   . Atrial tachycardia   . Diverticulosis   . Insomnia   . Anxiety   . Neuropathy      Review of Systems  neg for any significant sore throat, dysphagia, itching, sneezing, , sweats, unintended wt loss, pleuritic or exertional cp, hempoptysis, orthopnea pnd or change in chronic leg swelling. Also denies presyncope, palpitations, heartburn, abdominal pain, nausea, vomiting, diarrhea or  change in bowel or urinary habits, dysuria,hematuria, rash, arthralgias, visual complaints, headache, numbness weakness or ataxia.     Objective:   Physical Exam   Gen. Pleasant, well-nourished, in no distress, normal affect ENT - no lesions, no post nasal drip Neck: No JVD, no thyromegaly, no carotid bruits Lungs: no use of accessory muscles, no dullness to percussion,no wheezing  Cardiovascular: Rhythm regular, heart sounds  normal, no murmurs or gallops, no peripheral edema Abdomen: soft and non-tender, no hepatosplenomegaly, BS normal. Musculoskeletal: No deformities, no cyanosis or clubbing Neuro:  alert, non focal   CXR 07/27/13 >NAD     Assessment & Plan:

## 2013-08-02 NOTE — Assessment & Plan Note (Signed)
Resolving flare   Plan  Finish Levaquin.  Push Fluids .  Advance diet as tolerated.  Tylenol for fever As needed   Mucinex DM Twice daily  As needed  Cough/congestion  Follow up Dr. Lamonte Sakai  In 2-3 weeks and As needed   Please contact office for sooner follow up if symptoms do not improve or worsen or seek emergency care

## 2013-08-11 ENCOUNTER — Encounter (INDEPENDENT_AMBULATORY_CARE_PROVIDER_SITE_OTHER): Payer: Medicare Other | Admitting: Ophthalmology

## 2013-08-11 DIAGNOSIS — H35039 Hypertensive retinopathy, unspecified eye: Secondary | ICD-10-CM

## 2013-08-11 DIAGNOSIS — I10 Essential (primary) hypertension: Secondary | ICD-10-CM | POA: Diagnosis not present

## 2013-08-11 DIAGNOSIS — H2510 Age-related nuclear cataract, unspecified eye: Secondary | ICD-10-CM

## 2013-08-11 DIAGNOSIS — H353290 Exudative age-related macular degeneration, unspecified eye, stage unspecified: Secondary | ICD-10-CM | POA: Diagnosis not present

## 2013-08-11 DIAGNOSIS — H3530 Unspecified macular degeneration: Secondary | ICD-10-CM | POA: Diagnosis not present

## 2013-08-11 DIAGNOSIS — H43819 Vitreous degeneration, unspecified eye: Secondary | ICD-10-CM

## 2013-08-17 ENCOUNTER — Encounter: Payer: Self-pay | Admitting: Emergency Medicine

## 2013-08-17 ENCOUNTER — Ambulatory Visit (INDEPENDENT_AMBULATORY_CARE_PROVIDER_SITE_OTHER): Payer: Medicare Other | Admitting: Emergency Medicine

## 2013-08-17 VITALS — BP 140/86 | HR 114 | Ht 63.0 in | Wt 206.0 lb

## 2013-08-17 DIAGNOSIS — J309 Allergic rhinitis, unspecified: Secondary | ICD-10-CM

## 2013-08-17 DIAGNOSIS — J449 Chronic obstructive pulmonary disease, unspecified: Secondary | ICD-10-CM

## 2013-08-17 MED ORDER — FLUTICASONE PROPIONATE 50 MCG/ACT NA SUSP: 1.0000 | Freq: Every day | NASAL | Status: DC

## 2013-08-17 NOTE — Assessment & Plan Note (Signed)
-   restart loratadine - start low dose fluticasone to see if she can tolerate.

## 2013-08-17 NOTE — Assessment & Plan Note (Signed)
-   continue Advair  - prn SABA

## 2013-08-17 NOTE — Patient Instructions (Signed)
Stay on Advair twice a day. Gargle and clear mouth after using Use albuterol as needed Restart loratadine 10mg  daily Start fluticasone nasal spray, 1 spray each side once a day Follow with Dr [name] Molina in 4 months or sooner if you have any problems.

## 2013-08-17 NOTE — Progress Notes (Signed)
Subjective:    Patient ID: [name] Molina, female    DOB: May 11, 1946, 68 y.o.   MRN: [id number] HPI 68 yo woman, hx former tobacco and childhood asthma. Formerly followed by Dr Alva Garnet for mild COPD. Also hx GERD, HTN.  She was managed on Advair for years, but now that she is on Medicare she is looking for a less expensive option. She stopped the Advair in November.  Also, since September she has had much more trouble with nasal gtt, throat irritation, cough, throat clearing. More SABA use.   ROV 07/22/11 -- OSA/OHS, COPD, allergies and chronic cough.  Continued singulair, restarted allegra and added Qnasl. She is taking symbicort bid, needs to get financial assistance w this. Her nasal gtt is a lot better now, in fact her nose is dry on the Qnasl. Her breathing seems to be better also.   ROV 09/09/11 -- OSA/OHS, COPD, allergies and cough. We stopped Qnasl due to dryness, tried stopping Symbicort but needed to restart it. She has since run out and is ready to start Dulera 1 puff bid, possibly uptitrate to 2 puffs bid. She is on Nature conservation officer. No exacerbations since last time   ROV 01/07/12 -- OSA/OHS, COPD, allergies and cough.  She had been on Dulera due to cost, but now wants to go back to Advair - same cost. She believes she can stop the singulair if she is back on Advair. She is benefiting from [employer].    ROV 07/13/12 -- OSA/OHS, COPD, allergies and cough. Returns today for f/u - we changed Dulera back to Advair last visit (started 10/13). She had been doing well until about a week ago when she started having nasal gtt, some scratchy voice, occas cough. She stopped singulair.   ROV 11/13/12 -- OSA/OHS, COPD, allergies and cough.  She took singulair for a while last time, now is off of it. She is taking generic allegra. Her allergies are well controlled, cough is better. Uses albuterol rarely. No flares since last time.   ROV 06/04/13 -- OSA/OHS, COPD, allergies and cough. She had a URI in 9/'14  and was treated for an AE by Dr Gwenette Greet.  She is on Advair + albuterol prn, uses rarely. She is taking generic zyrtec right now (not fexofenadine). Not on singulair. No other flares. Has been active.   ROV 08/17/13 -- OSA/OHS, COPD, allergies and cough. She has frequent flares of predominantly UA disease > has been seen by Dr Elsworth Soho and by TP since last visit for probable influenza. Presents today with significant improvement. She had some wheeze, continues to have some nasal drainage. She has run out of loratadine, needs to restart. She still takes Advair, uses albuterol prn, about once a week.      Objective:   Physical Exam Filed Vitals:   08/17/13 1050  BP: 140/86  Pulse: 114  Height: 5\' 3"  (1.6 m)  Weight: 206 lb (93.441 kg)  SpO2: 96%   Gen: Pleasant, well-nourished, in no distress,  normal affect  ENT: No lesions,  mouth clear,  oropharynx clear, no postnasal drip  Neck: No JVD, no TMG, no carotid bruits  Lungs: No use of accessory muscles, no dullness to percussion, clear without rales or rhonchi  Cardiovascular: RRR, heart sounds normal, no murmur or gallops, no peripheral edema  Musculoskeletal: No deformities, no cyanosis or clubbing  Neuro: alert, non focal  Skin: Warm, no lesions or rashes     Assessment & Plan:  COPD - continue Advair  -  prn SABA  Allergic rhinitis - restart loratadine - start low dose fluticasone to see if she can tolerate.

## 2013-08-18 DIAGNOSIS — E785 Hyperlipidemia, unspecified: Secondary | ICD-10-CM | POA: Diagnosis not present

## 2013-08-18 DIAGNOSIS — R5383 Other fatigue: Secondary | ICD-10-CM | POA: Diagnosis not present

## 2013-08-18 DIAGNOSIS — J45909 Unspecified asthma, uncomplicated: Secondary | ICD-10-CM | POA: Diagnosis not present

## 2013-08-18 DIAGNOSIS — R5381 Other malaise: Secondary | ICD-10-CM | POA: Diagnosis not present

## 2013-08-18 DIAGNOSIS — G609 Hereditary and idiopathic neuropathy, unspecified: Secondary | ICD-10-CM | POA: Diagnosis not present

## 2013-08-18 DIAGNOSIS — I10 Essential (primary) hypertension: Secondary | ICD-10-CM | POA: Diagnosis not present

## 2013-08-18 DIAGNOSIS — F329 Major depressive disorder, single episode, unspecified: Secondary | ICD-10-CM | POA: Diagnosis not present

## 2013-09-03 ENCOUNTER — Telehealth: Payer: Self-pay | Admitting: Emergency Medicine

## 2013-09-03 NOTE — Telephone Encounter (Signed)
I called and spoke with pt. She reports the flonase have been working great for her. She does 1 spray each nostril QD. She reports RB advised her to call back and if she tolerates well she can increase to either 2 sprays each nostril QD or 1 spray each nostril BID. Pt would like to know what RB would like her to do. Please advise thanks

## 2013-09-08 NOTE — Telephone Encounter (Signed)
Lets go to 1 spray bid please

## 2013-09-08 NOTE — Telephone Encounter (Signed)
Pt aware of RB's rec's and nothing further is needed

## 2013-09-13 DIAGNOSIS — R002 Palpitations: Secondary | ICD-10-CM | POA: Diagnosis not present

## 2013-09-13 DIAGNOSIS — I471 Supraventricular tachycardia: Secondary | ICD-10-CM | POA: Diagnosis not present

## 2013-09-13 DIAGNOSIS — F411 Generalized anxiety disorder: Secondary | ICD-10-CM | POA: Diagnosis not present

## 2013-09-14 ENCOUNTER — Encounter: Payer: Self-pay | Admitting: Diagnostic Neuroimaging

## 2013-09-14 ENCOUNTER — Ambulatory Visit (INDEPENDENT_AMBULATORY_CARE_PROVIDER_SITE_OTHER): Payer: Medicare Other | Admitting: Diagnostic Neuroimaging

## 2013-09-14 VITALS — BP 147/82 | HR 95 | Ht 63.0 in | Wt 211.0 lb

## 2013-09-14 DIAGNOSIS — G609 Hereditary and idiopathic neuropathy, unspecified: Secondary | ICD-10-CM | POA: Diagnosis not present

## 2013-09-14 DIAGNOSIS — M48061 Spinal stenosis, lumbar region without neurogenic claudication: Secondary | ICD-10-CM | POA: Diagnosis not present

## 2013-09-14 NOTE — Progress Notes (Signed)
[facility]  PATIENT: [name] Molina DOB: 04-16-1946  REFERRING CLINICIAN: C White HISTORY FROM: patient  REASON FOR VISIT: new consult (existing patient)   HISTORICAL  CHIEF COMPLAINT:  Chief Complaint  Patient presents with  . Neurologic Problem    Peripheral  neuropathy # 7    HISTORY OF PRESENT ILLNESS:   NEW HPI (09/14/13): 68 year old female here for evaluation of neuropathy and lumbar spinal stenosis. I previously evaluated patient in 2010 for similar issue. Patient said progressive numbness in her feet, now progressing up to her knees. Patient also has some intermittent low back pain. Patient has significant balance and gait difficulty. She's falling frequently. Last severe fall was in Christmas 2014. She tries to hold onto furniture and walls for balance. She's been reluctant to use a cane or walker.  Separately patient also having intermittent shaking and tremors in her hands. This doesn't bother her that much. This is noticed mainly by other people. She still able to do her activities of daily living without interference from the tremor. Patient also having intermittent anxiety, intermittent chest tightness and heart palpitations.  UPDATE 04/19/09: still having numbness in feet and ankles; fell down few weeks ago on stairs; now trying to be more careful.  PRIOR HPI (01/18/09): 68 year old right-handed female with hypertension, paroxysmal atrial tachycardia, hyperlipidemia, hyperthyroidism status post radiation therapy, presenting for evaluation of a 3 year progressive abnormal sensation in her bilateral feet.  The patient describes the sensation as a feeling of her feet being covered in dried mud.  She denies burning sensation, pain or tingling sensations.  The sensation primary effectst he soles of her feet and more recently has extended to her ankle level.  She denies muscle weakness. She denies any abnormal sensation in her arms or hands.  She does have  history of low back pain radiating to left hip but this is not affecting her at the moment.  The patient has had 3 falls since May.  She attributes these to simple mishaps such as wearing flip-flops in the rain and catching her foot on a step while turning too quickly.   REVIEW OF SYSTEMS: Full 14 system review of systems performed and notable only for dizziness headache tremor depression nervousness anxiety joint pain joint swelling back pain daytime sleepiness snoring palpitations shortness of breath.  ALLERGIES: Allergies  Allergen Reactions  . Food Shortness Of Breath    ALLERGY= MUSHROOMS  . Tequin Shortness Of Breath  . Codeine Nausea And Vomiting  . Erythromycin Other (See Comments)    GI UPSET  . Wellbutrin [Bupropion Hcl] Other (See Comments)    SHAKING  . Penicillins Rash  . Prednisone Rash    HOME MEDICATIONS: Outpatient Prescriptions Prior to Visit  Medication Sig Dispense Refill  . albuterol (PROVENTIL HFA;VENTOLIN HFA) 108 (90 BASE) MCG/ACT inhaler Inhale 1 puff into the lungs every 6 (six) hours as needed. Shortness of breath  1 Inhaler  11  . aspirin 81 MG tablet Take 81 mg by mouth daily.        Marland Kitchen BESIVANCE 0.6 % SUSP Place 1 drop into both eyes 4 (four) times daily as needed. Eye infection      . Calcium Carbonate-Vit D-Min (CALCIUM 1200 PO) Take 1,200 mg by mouth daily. (slow release)      . diltiazem (CARDIZEM CD) 120 MG 24 hr capsule Take 120 mg by mouth Daily.      Marland Kitchen FLUoxetine (PROZAC) 20 MG capsule Take 20 mg by mouth Daily.      Marland Kitchen  fluticasone (FLONASE) 50 MCG/ACT nasal spray Place 1 spray into both nostrils daily.  16 g  11  . Fluticasone-Salmeterol (ADVAIR DISKUS) 250-50 MCG/DOSE AEPB Inhale 1 puff into the lungs 2 (two) times daily.  60 each  11  . loratadine-pseudoephedrine (CLARITIN-D 12-HOUR) 5-120 MG per tablet Take 1 tablet by mouth daily.      Marland Kitchen LORazepam (ATIVAN) 0.5 MG tablet Take 0.5-1 mg by mouth as needed. Anxiety      . losartan (COZAAR) 50 MG  tablet Take 50 mg by mouth Daily.      . Multiple Vitamins-Minerals (OCUVITE PO) Take 1 tablet by mouth daily.       Marland Kitchen omeprazole (PRILOSEC) 20 MG capsule Take 20 mg by mouth Nightly.        . pravastatin (PRAVACHOL) 40 MG tablet Take 40 mg by mouth at bedtime.       . temazepam (RESTORIL) 30 MG capsule Take 1 tablet by mouth Nightly.       No facility-administered medications prior to visit.    PAST MEDICAL HISTORY: Past Medical History  Diagnosis Date  . COPD (chronic obstructive pulmonary disease)   . Asthma   . GERD (gastroesophageal reflux disease)   . Hyperlipidemia   . Hypertension   . Atrial tachycardia   . Diverticulosis   . Insomnia   . Anxiety   . Neuropathy     PAST SURGICAL HISTORY: Past Surgical History  Procedure Laterality Date  . Colonoscopy    . Removed cartliage rib    . Polypectomy    . Cervical polypectomy      INSIDE AND OUTSIDE    FAMILY HISTORY: Family History  Problem Relation Age of Onset  . Colon polyps Sister 3    PRECANCEROUS POLYPS  . Breast cancer Maternal Grandmother 57  . Emphysema Father   . Asthma Father   . Heart disease Father   . Heart disease Paternal Grandfather     SOCIAL HISTORY:  History   Social History  . Marital Status: Divorced    Spouse Name: N/A    Number of Children: 3  . Years of Education: college   Occupational History  . Emergency planning/management officer    Social History Main Topics  . Smoking status: Former Smoker -- 1.00 packs/day for 20 years    Types: Cigarettes    Quit date: 05/21/1991  . Smokeless tobacco: Never Used  . Alcohol Use: No  . Drug Use: No  . Sexual Activity: Not on file   Other Topics Concern  . Not on file   Social History Narrative   Patient lives at home alone    Patient drinks coffee     PHYSICAL EXAM  Filed Vitals:   09/14/13 1016  BP: 147/82  Pulse: 95  Height: 5\' 3"  (1.6 m)  Weight: 211 lb (95.709 kg)    Not recorded    Body mass index is 37.39  kg/(m^2).  GENERAL EXAM: Patient is in no distress; well developed, nourished and groomed; neck is supple  CARDIOVASCULAR: Regular rate and rhythm, no murmurs, no carotid bruits  NEUROLOGIC: MENTAL STATUS: awake, alert, oriented to person, place and time, recent and remote memory intact, normal attention and concentration, language fluent, comprehension intact, naming intact, fund of knowledge appropriate CRANIAL NERVE: no papilledema on fundoscopic exam, pupils equal and reactive to light, visual fields full to confrontation, extraocular muscles intact, no nystagmus, facial sensation and strength symmetric, hearing intact, palate elevates symmetrically, uvula midline, shoulder shrug symmetric, tongue  midline. MOTOR: normal bulk and tone, MILD POSTURAL AND ACTION TREMOR IN BUE. NO RIGIDITY. NO BRADYKINESIA. Full strength in the BUE, BLE; EXCEPT BILATERAL KE, KF 4+; BILATERAL DF 3+. SENSORY: DECR PP, TEMP IN FEET UP TO KNEES. VIB 3 SEC AT TOES. ABSENT PROPRIO AT TOES. COORDINATION: finger-nose-finger, fine finger movements normal REFLEXES: BUE 1, KNEES TRACEM ANKLES 0. GAIT/STATION: SLOW CAUTIOUS GAIT. CANNOT STAND ON HEELS. ABLE TO TAKE STEPS ON TOES. CANNOT TANDEM. UNSTEADY WITH EYES OPEN AND FEET TOGETHER.    DIAGNOSTIC DATA (LABS, IMAGING, TESTING) - I reviewed patient records, labs, notes, testing and imaging myself where available.  No results found for this basename: WBC, HGB, HCT, MCV, PLT   No results found for this basename: na, k, cl, co2, glucose, bun, creatinine, calcium, prot, albumin, ast, alt, alkphos, bilitot, gfrnonaa, gfraa   No results found for this basename: CHOL, HDL, LDLCALC, LDLDIRECT, TRIG, CHOLHDL   No results found for this basename: HGBA1C   No results found for this basename: VITAMINB12   No results found for this basename: TSH    02/10/09 MRI LUMBAR  - At L4-5 there is left lateral recess stenosis and moderate spinal stenosis due to left greater than  right facet and ligamentum flavum hypertrophy with circumferential disc bulging.  This results in potential impingement upon the descending left L5 nerve root.  01/27/09 EMG/NCS - severe length-dependent, axonal, sensorimotor polyneuropathy with evidence of chronic denervation.  01/18/09 NEUROPATHY LABS - SPEP, anti-HU, anti-Yo, SSA, SSB, ANA, RPR, cryoglobulins, B6, urine heavy metals, glucose (fasting and 2 hour post-prandial), MMA, homocysteine, B12, TSH - ALL NORMAL - Vit E - 16.7 (H) - (normal range 3.0-15.8)   ASSESSMENT AND PLAN  68 y.o. year old female here with idiopathic neuropathy and lumbar radiculopathy/spinal stenosis. Mild elevation of Vit E; other labs normal. No pain. Mild/mod weakness and more falls.  PLAN: 1. PT evaluation; use cane or walker 2. Patient and I are reluctant to pursue surgical mgmt of lumbar spine issues, because she has concomitant severe neuropathy, which would make her recovery more challenging/limited. We will continue conservative mgmt.   Orders Placed This Encounter  Procedures  . Ambulatory referral to Physical Therapy   Return in about 6 months (around 03/16/2014) for with Jarold Motto, MD 2/53/6644, 03:47 AM Certified in Neurology, Neurophysiology and Neuroimaging  [facility] [address] [place], [place] [address] [phone]

## 2013-09-14 NOTE — Patient Instructions (Signed)
Try physical therapy

## 2013-09-20 DIAGNOSIS — Z23 Encounter for immunization: Secondary | ICD-10-CM | POA: Diagnosis not present

## 2013-09-22 ENCOUNTER — Encounter (INDEPENDENT_AMBULATORY_CARE_PROVIDER_SITE_OTHER): Payer: Medicare Other | Admitting: Ophthalmology

## 2013-09-22 DIAGNOSIS — H3530 Unspecified macular degeneration: Secondary | ICD-10-CM | POA: Diagnosis not present

## 2013-09-22 DIAGNOSIS — H353290 Exudative age-related macular degeneration, unspecified eye, stage unspecified: Secondary | ICD-10-CM | POA: Diagnosis not present

## 2013-09-22 DIAGNOSIS — H2510 Age-related nuclear cataract, unspecified eye: Secondary | ICD-10-CM

## 2013-09-22 DIAGNOSIS — I10 Essential (primary) hypertension: Secondary | ICD-10-CM

## 2013-09-22 DIAGNOSIS — H35039 Hypertensive retinopathy, unspecified eye: Secondary | ICD-10-CM

## 2013-09-22 DIAGNOSIS — H43819 Vitreous degeneration, unspecified eye: Secondary | ICD-10-CM

## 2013-11-02 ENCOUNTER — Encounter (INDEPENDENT_AMBULATORY_CARE_PROVIDER_SITE_OTHER): Payer: Medicare Other | Admitting: Ophthalmology

## 2013-11-02 DIAGNOSIS — H353290 Exudative age-related macular degeneration, unspecified eye, stage unspecified: Secondary | ICD-10-CM

## 2013-11-02 DIAGNOSIS — H35039 Hypertensive retinopathy, unspecified eye: Secondary | ICD-10-CM | POA: Diagnosis not present

## 2013-11-02 DIAGNOSIS — H3530 Unspecified macular degeneration: Secondary | ICD-10-CM | POA: Diagnosis not present

## 2013-11-02 DIAGNOSIS — I10 Essential (primary) hypertension: Secondary | ICD-10-CM

## 2013-11-02 DIAGNOSIS — H43819 Vitreous degeneration, unspecified eye: Secondary | ICD-10-CM

## 2013-11-03 ENCOUNTER — Encounter (INDEPENDENT_AMBULATORY_CARE_PROVIDER_SITE_OTHER): Payer: Medicare Other | Admitting: Ophthalmology

## 2013-11-08 ENCOUNTER — Encounter: Payer: Self-pay | Admitting: Cardiology

## 2013-11-08 ENCOUNTER — Ambulatory Visit (INDEPENDENT_AMBULATORY_CARE_PROVIDER_SITE_OTHER): Payer: Medicare Other | Admitting: Cardiology

## 2013-11-08 VITALS — BP 159/84 | HR 88 | Ht 63.0 in | Wt 212.8 lb

## 2013-11-08 DIAGNOSIS — E669 Obesity, unspecified: Secondary | ICD-10-CM | POA: Diagnosis not present

## 2013-11-08 DIAGNOSIS — I471 Supraventricular tachycardia: Secondary | ICD-10-CM | POA: Diagnosis not present

## 2013-11-08 DIAGNOSIS — R002 Palpitations: Secondary | ICD-10-CM

## 2013-11-08 MED ORDER — DILTIAZEM HCL ER COATED BEADS 240 MG PO CP24: 240.0000 mg | ORAL_CAPSULE | Freq: Every day | ORAL | Status: DC

## 2013-11-08 NOTE — Progress Notes (Signed)
[place]. [address], [address], [place]  [address] Phone: [phone] Fax:  [phone]  Date:  11/08/2013   ID:  Barba, Solt 10-19-1945, MRN [id number]  PCP:  [name], MD   History of Present Illness: [name] Molina is a 68 y.o. female here for the evaluation of palpitations, paroxysmal atrial tachycardia. I had seen her last approximately 4 years ago in 2011. At that time, nuclear stress test had been performed which was low risk, no ischemia, ejection fraction on echocardiogram was normal. She had been on diltiazem for several years. Or palpitations/fluttering sensation have been paroxysmal over the last several years. About a month ago while driving felt palpitations, scared her. Self terminated. Occasional short burst. One time, she was woken with palpitations in the middle night. Self terminating. Recent platelet donation was aborted because of anemia on fingerstick. Her CBC and TSH done by Dr. Dema Severin were normal.   Wt Readings from Last 3 Encounters:  11/08/13 212 lb 12.8 oz (96.525 kg)  09/14/13 211 lb (95.709 kg)  08/17/13 206 lb (93.441 kg)     Past Medical History  Diagnosis Date  . COPD (chronic obstructive pulmonary disease)   . Asthma   . GERD (gastroesophageal reflux disease)   . Hyperlipidemia   . Hypertension   . Atrial tachycardia   . Diverticulosis   . Insomnia   . Anxiety   . Neuropathy     Past Surgical History  Procedure Laterality Date  . Colonoscopy    . Removed cartliage rib    . Polypectomy    . Cervical polypectomy      INSIDE AND OUTSIDE    Current Outpatient Prescriptions  Medication Sig Dispense Refill  . albuterol (PROVENTIL HFA;VENTOLIN HFA) 108 (90 BASE) MCG/ACT inhaler Inhale 1 puff into the lungs every 6 (six) hours as needed. Shortness of breath  1 Inhaler  11  . aspirin 81 MG tablet Take 81 mg by mouth daily.        Marland Kitchen BESIVANCE 0.6 % SUSP Place 1 drop into both eyes 4 (four) times daily as needed. Eye  infection      . Calcium Carbonate-Vit D-Min (CALCIUM 1200 PO) Take 1,200 mg by mouth daily. (slow release)      . diltiazem (CARDIZEM CD) 120 MG 24 hr capsule Take 120 mg by mouth Daily.      Marland Kitchen FLUoxetine (PROZAC) 20 MG capsule Take 20 mg by mouth Daily.      . fluticasone (FLONASE) 50 MCG/ACT nasal spray Place 1 spray into both nostrils daily.  16 g  11  . Fluticasone-Salmeterol (ADVAIR DISKUS) 250-50 MCG/DOSE AEPB Inhale 1 puff into the lungs 2 (two) times daily.  60 each  11  . loratadine-pseudoephedrine (CLARITIN-D 12-HOUR) 5-120 MG per tablet Take 1 tablet by mouth daily.      Marland Kitchen LORazepam (ATIVAN) 0.5 MG tablet Take 0.5-1 mg by mouth as needed. Anxiety      . losartan (COZAAR) 50 MG tablet Take 50 mg by mouth Daily.      . Multiple Vitamins-Minerals (OCUVITE PO) Take 1 tablet by mouth daily.       Marland Kitchen omeprazole (PRILOSEC) 20 MG capsule Take 20 mg by mouth Nightly.        . pravastatin (PRAVACHOL) 40 MG tablet Take 40 mg by mouth at bedtime.       . zaleplon (SONATA) 5 MG capsule Take 5 mg by mouth daily.  No current facility-administered medications for this visit.    Allergies:    Allergies  Allergen Reactions  . Food Shortness Of Breath    ALLERGY= MUSHROOMS  . Tequin Shortness Of Breath  . Codeine Nausea And Vomiting  . Erythromycin Other (See Comments)    GI UPSET  . Wellbutrin [Bupropion Hcl] Other (See Comments)    SHAKING  . Penicillins Rash  . Prednisone Rash    Social History:  The patient  reports that she quit smoking about 22 years ago. Her smoking use included Cigarettes. She has a 20 pack-year smoking history. She has never used smokeless tobacco. She reports that she does not drink alcohol or use illicit drugs.   Family History  Problem Relation Age of Onset  . Colon polyps Sister 1    PRECANCEROUS POLYPS  . Breast cancer Maternal Grandmother 85  . Emphysema Father   . Asthma Father   . Heart disease Father   . Heart disease Paternal Grandfather      ROS:  Please see the history of present illness.   No syncope, no bleeding, no orthopnea, no PND, fleeting, atypical chest pain.    All other systems reviewed and negative.   PHYSICAL EXAM: VS:  BP 159/84  Pulse 88  Ht 5\' 3"  (1.6 m)  Wt 212 lb 12.8 oz (96.525 kg)  BMI 37.71 kg/m2 Well nourished, well developed, in no acute distress HEENT: normal, [place]/AT, EOMI Neck: no JVD, normal carotid upstroke, no bruit Cardiac:  normal S1, S2; RRR; no murmur Lungs:  clear to auscultation bilaterally, no wheezing, rhonchi or rales Abd: soft, nontender, no hepatomegaly, no bruitsoverweight Ext: no edema, 2+ distal pulses Skin: warm and dry GU: deferred Neuro: no focal abnormalities noted, AAO x 3  EKG:  11/08/13-sinus rhythm rate 88 with relatively short PR interval, nonspecific ST flattening, no other abnormalities. Normal QT interval. Holter monitor: 2010 - 20 PVCs, brief runs of paroxysmal atrial tachycardia.  Labs: 4/15-TSH-normal, CBC, normal.  ASSESSMENT AND PLAN:  1. Palpitations-paroxysmal atrial tachycardia-has been on diltiazem 120 long-acting for quite some time. This most recent episode worried her, at least lasted over 20 minutes duration. My first step will be to increase the diltiazem to 240 mg once a day. Her blood pressure should be able to tolerate this. In one month, we will have her come back and see APP clinic. If her symptoms continue or become more frequent, we could entertain the thought of monitoring her once again. In 2010, Holter monitor did demonstrate PVCs as well as 2 episodes of paroxysmal atrial tachycardia. No adverse arrhythmias were detected. She is not having any high-risk symptoms such as syncope. 2. Obesity-continue to encourage weight loss.  Signed, Candee Furbish, MD [facility]  11/08/2013 11:23 AM

## 2013-11-08 NOTE — Patient Instructions (Signed)
INCREASE DILTIAZEM TO 240 MG DAILY  Your physician recommends that you schedule a follow-up appointment in: 1 MONTH OV WITH NP/PA

## 2013-12-13 ENCOUNTER — Ambulatory Visit (INDEPENDENT_AMBULATORY_CARE_PROVIDER_SITE_OTHER): Payer: Medicare Other | Admitting: Physician Assistant

## 2013-12-13 ENCOUNTER — Encounter: Payer: Self-pay | Admitting: Physician Assistant

## 2013-12-13 ENCOUNTER — Ambulatory Visit: Payer: Medicare Other | Admitting: Physician Assistant

## 2013-12-13 VITALS — BP 133/72 | HR 83 | Ht 63.0 in | Wt 214.0 lb

## 2013-12-13 DIAGNOSIS — R002 Palpitations: Secondary | ICD-10-CM | POA: Diagnosis not present

## 2013-12-13 DIAGNOSIS — Z1231 Encounter for screening mammogram for malignant neoplasm of breast: Secondary | ICD-10-CM | POA: Diagnosis not present

## 2013-12-13 DIAGNOSIS — I10 Essential (primary) hypertension: Secondary | ICD-10-CM | POA: Diagnosis not present

## 2013-12-13 DIAGNOSIS — Z124 Encounter for screening for malignant neoplasm of cervix: Secondary | ICD-10-CM | POA: Diagnosis not present

## 2013-12-13 NOTE — Patient Instructions (Signed)
Your physician recommends that you continue on your current medications as directed. Please refer to the Current Medication list given to you today.  Your physician wants you to follow-up in: 6 months with Dr. Skains. You will receive a reminder letter in the mail two months in advance. If you don't receive a letter, please call our office to schedule the follow-up appointment.  

## 2013-12-13 NOTE — Progress Notes (Signed)
Cardiology Office Note    Date:  12/13/2013   ID:  [name] Molina 09-18-1945, MRN [id number]  PCP:  [name] Schwalbe, MD  Cardiologist:  Dr. Candee Molina      History of Present Illness: [name] Molina is a 69 y.o. female with a history of palpitations and paroxysmal atrial tachycardia, HTN, HL, COPD/asthma. According to the notes, she had a nuclear stress test in 2011 which was low risk and negative for ischemia. Echocardiogram at that time also demonstrated normal LV function. Recently seen by Dr. Marlou Molina in 10/2013 with worsening palpitations. Recent labs demonstrated a normal TSH and CBC. Diltiazem was increased from 120 to 240 mg daily. She returns for follow up  She has had less palpitations on high-dose diltiazem. She was in [place] recently in the extreme heat. She had difficulty with her COPD. However, she denies any palpitations. Her palpitations have significantly improved. She has chronic dyspnea with exertion from COPD without significant change. She denies chest pain, orthopnea, PND or edema. She denies syncope.  Recent Labs: No results found for requested labs within last 365 days.  Wt Readings from Last 3 Encounters:  12/13/13 214 lb (97.07 kg)  11/08/13 212 lb 12.8 oz (96.525 kg)  09/14/13 211 lb (95.709 kg)     Past Medical History  Diagnosis Date  . COPD (chronic obstructive pulmonary disease)   . Asthma   . GERD (gastroesophageal reflux disease)   . Hyperlipidemia   . Hypertension   . Atrial tachycardia   . Diverticulosis   . Insomnia   . Anxiety   . Neuropathy     Current Outpatient Prescriptions  Medication Sig Dispense Refill  . albuterol (PROVENTIL HFA;VENTOLIN HFA) 108 (90 BASE) MCG/ACT inhaler Inhale 1 puff into the lungs every 6 (six) hours as needed. Shortness of breath  1 Inhaler  11  . aspirin 81 MG tablet Take 81 mg by mouth daily.        Marland Kitchen BESIVANCE 0.6 % SUSP Place 1 drop into both eyes 4 (four) times daily as needed. Eye infection      .  Calcium Carbonate-Vit D-Min (CALCIUM 1200 PO) Take 1,200 mg by mouth daily. (slow release)      . clobetasol cream (TEMOVATE) 0.05 %       . diltiazem (CARDIZEM CD) 240 MG 24 hr capsule Take 1 capsule (240 mg total) by mouth daily.  30 capsule  1  . FLUoxetine (PROZAC) 20 MG capsule Take 20 mg by mouth Daily.      . fluticasone (FLONASE) 50 MCG/ACT nasal spray Place 1 spray into both nostrils daily.  16 g  11  . Fluticasone-Salmeterol (ADVAIR DISKUS) 250-50 MCG/DOSE AEPB Inhale 1 puff into the lungs 2 (two) times daily.  60 each  11  . loratadine-pseudoephedrine (CLARITIN-D 12-HOUR) 5-120 MG per tablet Take 1 tablet by mouth daily.      Marland Kitchen LORazepam (ATIVAN) 0.5 MG tablet Take 0.5-1 mg by mouth as needed. Anxiety      . losartan (COZAAR) 50 MG tablet Take 50 mg by mouth Daily.      . Multiple Vitamins-Minerals (OCUVITE PO) Take 1 tablet by mouth daily.       Marland Kitchen omeprazole (PRILOSEC) 20 MG capsule Take 20 mg by mouth Nightly.        . pravastatin (PRAVACHOL) 40 MG tablet Take 40 mg by mouth at bedtime.       . zaleplon (SONATA) 5 MG capsule Take 5 mg by mouth  daily.       No current facility-administered medications for this visit.    Allergies:   Food; Tequin; Codeine; Erythromycin; Wellbutrin; Penicillins; and Prednisone   Social History:  The patient  reports that she quit smoking about 22 years ago. Her smoking use included Cigarettes. She has a 20 pack-year smoking history. She has never used smokeless tobacco. She reports that she does not drink alcohol or use illicit drugs.   Family History:  The patient's family history includes Asthma in her father; Breast cancer (age of onset: 15) in her maternal grandmother; Colon polyps (age of onset: 71) in her sister; Emphysema in her father; Heart disease in her father and paternal grandfather.   ROS:  Please see the history of present illness.      All other systems reviewed and negative.   PHYSICAL EXAM: VS:  BP 133/72  Pulse 83  Ht 5\' 3"   (1.6 m)  Wt 214 lb (97.07 kg)  BMI 37.92 kg/m2 Well nourished, well developed, in no acute distress HEENT: normal Neck: I cannot appreciate JVD Cardiac:  normal S1, S2; RRR; no murmur Lungs:  clear to auscultation bilaterally, no wheezing, rhonchi or rales Abd: soft, nontender, no hepatomegaly Ext: no edema Skin: warm and dry Neuro:  CNs 2-12 intact, no focal abnormalities noted  EKG:  NSR, HR 83, short PR, NSSTTW changes, no change from prior tracing     ASSESSMENT AND PLAN:  Palpitations:  Improved.  Continue current dose of Diltiazem.  Essential hypertension:  Controlled.     Disposition:  F/u with Dr. Candee Molina in 6 mos.    Signed, [name] Molina, MHS 12/13/2013 9:52 AM    [facility] [place], [place], [place]  [address] Phone: [phone]; Fax: [phone]

## 2013-12-21 ENCOUNTER — Encounter (INDEPENDENT_AMBULATORY_CARE_PROVIDER_SITE_OTHER): Payer: Medicare Other | Admitting: Ophthalmology

## 2013-12-21 DIAGNOSIS — H3530 Unspecified macular degeneration: Secondary | ICD-10-CM

## 2013-12-21 DIAGNOSIS — I10 Essential (primary) hypertension: Secondary | ICD-10-CM | POA: Diagnosis not present

## 2013-12-21 DIAGNOSIS — H43819 Vitreous degeneration, unspecified eye: Secondary | ICD-10-CM

## 2013-12-21 DIAGNOSIS — H353290 Exudative age-related macular degeneration, unspecified eye, stage unspecified: Secondary | ICD-10-CM | POA: Diagnosis not present

## 2013-12-21 DIAGNOSIS — H2510 Age-related nuclear cataract, unspecified eye: Secondary | ICD-10-CM

## 2013-12-21 DIAGNOSIS — H35039 Hypertensive retinopathy, unspecified eye: Secondary | ICD-10-CM | POA: Diagnosis not present

## 2013-12-23 ENCOUNTER — Encounter: Payer: Self-pay | Admitting: Emergency Medicine

## 2013-12-23 ENCOUNTER — Ambulatory Visit (INDEPENDENT_AMBULATORY_CARE_PROVIDER_SITE_OTHER): Payer: Medicare Other | Admitting: Emergency Medicine

## 2013-12-23 VITALS — BP 128/84 | HR 90 | Temp 97.5°F | Ht 63.5 in | Wt 212.6 lb

## 2013-12-23 DIAGNOSIS — J449 Chronic obstructive pulmonary disease, unspecified: Secondary | ICD-10-CM

## 2013-12-23 NOTE — Patient Instructions (Signed)
Please continue your medications as you have been using them Consider restarting your Singulair this Fall for your allergies.  Follow with Dr Lamonte Sakai in 6 months or sooner if you have any problems

## 2013-12-23 NOTE — Assessment & Plan Note (Signed)
Has been stable   Please continue your medications as you have been using them Consider restarting your Singulair this Fall for your allergies.  Follow with Dr Lamonte Sakai in 6 months or sooner if you have any problems

## 2013-12-23 NOTE — Progress Notes (Signed)
Subjective:    Patient ID: [name] Molina, female    DOB: 21-Jan-1946, 68 y.o.   MRN: [id number] HPI 69 yo woman, hx former tobacco and childhood asthma. Formerly followed by Dr Alva Garnet for mild COPD. Also hx GERD, HTN.  She was managed on Advair for years, but now that she is on Medicare she is looking for a less expensive option. She stopped the Advair in November.  Also, since September she has had much more trouble with nasal gtt, throat irritation, cough, throat clearing. More SABA use.   ROV 07/22/11 -- OSA/OHS, COPD, allergies and chronic cough.  Continued singulair, restarted allegra and added Qnasl. She is taking symbicort bid, needs to get financial assistance w this. Her nasal gtt is a lot better now, in fact her nose is dry on the Qnasl. Her breathing seems to be better also.   ROV 09/09/11 -- OSA/OHS, COPD, allergies and cough. We stopped Qnasl due to dryness, tried stopping Symbicort but needed to restart it. She has since run out and is ready to start Dulera 1 puff bid, possibly uptitrate to 2 puffs bid. She is on Nature conservation officer. No exacerbations since last time   ROV 01/07/12 -- OSA/OHS, COPD, allergies and cough.  She had been on Dulera due to cost, but now wants to go back to Advair - same cost. She believes she can stop the singulair if she is back on Advair. She is benefiting from [employer].    ROV 07/13/12 -- OSA/OHS, COPD, allergies and cough. Returns today for f/u - we changed Dulera back to Advair last visit (started 10/13). She had been doing well until about a week ago when she started having nasal gtt, some scratchy voice, occas cough. She stopped singulair.   ROV 11/13/12 -- OSA/OHS, COPD, allergies and cough.  She took singulair for a while last time, now is off of it. She is taking generic allegra. Her allergies are well controlled, cough is better. Uses albuterol rarely. No flares since last time.   ROV 06/04/13 -- OSA/OHS, COPD, allergies and cough. She had a URI in 9/'14  and was treated for an AE by Dr Gwenette Greet.  She is on Advair + albuterol prn, uses rarely. She is taking generic zyrtec right now (not fexofenadine). Not on singulair. No other flares. Has been active.   ROV 08/17/13 -- OSA/OHS, COPD, allergies and cough. She has frequent flares of predominantly UA disease > has been seen by Dr Elsworth Soho and by TP since last visit for probable influenza. Presents today with significant improvement. She had some wheeze, continues to have some nasal drainage. She has run out of loratadine, needs to restart. She still takes Advair, uses albuterol prn, about once a week.   ROV 12/23/13 -- OSA/OHS, COPD, allergies and cough, UA irritation.  She has been doing fairly well. She has been on Advair, uses albuterol prn - a few times a month. No flares since last time. Her diltiazem was recently increased.  She had the prevnar at Dr Orest Dikes office this year.      Objective:   Physical Exam Filed Vitals:   12/23/13 1627  BP: 128/84  Pulse: 90  Temp: 97.5 F (36.4 C)  TempSrc: Oral  Height: 5' 3.5" (1.613 m)  Weight: 212 lb 9.6 oz (96.435 kg)  SpO2: 96%   Gen: Pleasant, well-nourished, in no distress,  normal affect  ENT: No lesions,  mouth clear,  oropharynx clear, no postnasal drip  Neck: No JVD, no  TMG, no carotid bruits  Lungs: No use of accessory muscles, no dullness to percussion, clear without rales or rhonchi  Cardiovascular: RRR, heart sounds normal, no murmur or gallops, no peripheral edema  Musculoskeletal: No deformities, no cyanosis or clubbing  Neuro: alert, non focal  Skin: Warm, no lesions or rashes     Assessment & Plan:  COPD Has been stable   Please continue your medications as you have been using them Consider restarting your Singulair this Fall for your allergies.  Follow with Dr Lamonte Sakai in 6 months or sooner if you have any problems

## 2014-01-05 ENCOUNTER — Other Ambulatory Visit: Payer: Self-pay | Admitting: Cardiology

## 2014-02-03 ENCOUNTER — Other Ambulatory Visit: Payer: Self-pay | Admitting: Cardiology

## 2014-02-04 ENCOUNTER — Encounter: Payer: Self-pay | Admitting: *Deleted

## 2014-02-08 ENCOUNTER — Encounter (INDEPENDENT_AMBULATORY_CARE_PROVIDER_SITE_OTHER): Payer: Medicare Other | Admitting: Ophthalmology

## 2014-02-08 DIAGNOSIS — H35039 Hypertensive retinopathy, unspecified eye: Secondary | ICD-10-CM

## 2014-02-08 DIAGNOSIS — H353290 Exudative age-related macular degeneration, unspecified eye, stage unspecified: Secondary | ICD-10-CM

## 2014-02-08 DIAGNOSIS — I10 Essential (primary) hypertension: Secondary | ICD-10-CM

## 2014-02-08 DIAGNOSIS — H43819 Vitreous degeneration, unspecified eye: Secondary | ICD-10-CM

## 2014-02-08 DIAGNOSIS — H3530 Unspecified macular degeneration: Secondary | ICD-10-CM

## 2014-02-08 DIAGNOSIS — H2510 Age-related nuclear cataract, unspecified eye: Secondary | ICD-10-CM

## 2014-02-21 DIAGNOSIS — G4733 Obstructive sleep apnea (adult) (pediatric): Secondary | ICD-10-CM | POA: Diagnosis not present

## 2014-02-21 DIAGNOSIS — Z23 Encounter for immunization: Secondary | ICD-10-CM | POA: Diagnosis not present

## 2014-02-21 DIAGNOSIS — G43009 Migraine without aura, not intractable, without status migrainosus: Secondary | ICD-10-CM | POA: Diagnosis not present

## 2014-02-21 DIAGNOSIS — I10 Essential (primary) hypertension: Secondary | ICD-10-CM | POA: Diagnosis not present

## 2014-02-21 DIAGNOSIS — E0590 Thyrotoxicosis, unspecified without thyrotoxic crisis or storm: Secondary | ICD-10-CM | POA: Diagnosis not present

## 2014-02-21 DIAGNOSIS — Z0000 Encounter for general adult medical examination without abnormal findings: Secondary | ICD-10-CM | POA: Diagnosis not present

## 2014-02-21 DIAGNOSIS — E6609 Other obesity due to excess calories: Secondary | ICD-10-CM | POA: Diagnosis not present

## 2014-02-21 DIAGNOSIS — E785 Hyperlipidemia, unspecified: Secondary | ICD-10-CM | POA: Diagnosis not present

## 2014-02-21 DIAGNOSIS — J309 Allergic rhinitis, unspecified: Secondary | ICD-10-CM | POA: Diagnosis not present

## 2014-03-16 ENCOUNTER — Encounter (INDEPENDENT_AMBULATORY_CARE_PROVIDER_SITE_OTHER): Payer: Self-pay

## 2014-03-16 ENCOUNTER — Ambulatory Visit (INDEPENDENT_AMBULATORY_CARE_PROVIDER_SITE_OTHER): Payer: Medicare Other | Admitting: Nurse Practitioner

## 2014-03-16 ENCOUNTER — Encounter: Payer: Self-pay | Admitting: Nurse Practitioner

## 2014-03-16 VITALS — BP 126/66 | HR 79 | Ht 63.5 in | Wt 217.8 lb

## 2014-03-16 DIAGNOSIS — G609 Hereditary and idiopathic neuropathy, unspecified: Secondary | ICD-10-CM | POA: Diagnosis not present

## 2014-03-16 DIAGNOSIS — M4806 Spinal stenosis, lumbar region: Secondary | ICD-10-CM

## 2014-03-16 DIAGNOSIS — M48061 Spinal stenosis, lumbar region without neurogenic claudication: Secondary | ICD-10-CM | POA: Insufficient documentation

## 2014-03-16 NOTE — Progress Notes (Signed)
PATIENT: [name] Molina DOB: 1945-09-25  REASON FOR VISIT: routine follow up for neuropathy HISTORY FROM: patient  HISTORY OF PRESENT ILLNESS: 68 year old female here for evaluation of neuropathy and lumbar spinal stenosis.  UPDATE 03/16/14 (LL): [name] Molina returns for  6 month followup. She states that the numbness in her feet and legs is about the same, not really worse. She denies any pain in her feet or lower legs. She did not do physical therapy after last office visit, she does not remember it being discussed and states that she did not receive a call about scheduling it. She does not feel as though she needs physical therapy at this point. She is very careful with her walking and has not fallen since Christmas 2014.  She does not want to use a cane or walker until she absolutely has to.   NEW HPI (09/14/13):  I previously evaluated patient in 2010 for similar issue. Patient said progressive numbness in her feet, now progressing up to her knees. Patient also has some intermittent low back pain. Patient has significant balance and gait difficulty. She's falling frequently. Last severe fall was in Christmas 2014. She tries to hold onto furniture and walls for balance. She's been reluctant to use a cane or walker.  Separately patient also having intermittent shaking and tremors in her hands. This doesn't bother her that much. This is noticed mainly by other people. She still able to do her activities of daily living without interference from the tremor. Patient also having intermittent anxiety, intermittent chest tightness and heart palpitations.  UPDATE 04/19/09: still having numbness in feet and ankles; fell down few weeks ago on stairs; now trying to be more careful.  PRIOR HPI (01/18/09): 68 year old right-handed female with hypertension, paroxysmal atrial tachycardia, hyperlipidemia, hyperthyroidism status post radiation therapy, presenting for evaluation of a 3 year progressive abnormal  sensation in her bilateral feet.  The patient describes the sensation as a feeling of her feet being covered in dried mud. She denies burning sensation, pain or tingling sensations. The sensation primary effectst he soles of her feet and more recently has extended to her ankle level. She denies muscle weakness. She denies any abnormal sensation in her arms or hands. She does have history of low back pain radiating to left hip but this is not affecting her at the moment.  The patient has had 3 falls since May. She attributes these to simple mishaps such as wearing flip-flops in the rain and catching her foot on a step while turning too quickly.   REVIEW OF SYSTEMS: Full 14 system review of systems performed and notable only for: apnea, frequent waking, snoring, daytime sleepiness, joint pain, muscle cramps, dizziness, headache, tremors   ALLERGIES: Allergies  Allergen Reactions  . Food Shortness Of Breath    ALLERGY= MUSHROOMS  . Tequin Shortness Of Breath  . Codeine Nausea And Vomiting  . Erythromycin Other (See Comments)    GI UPSET  . Wellbutrin [Bupropion Hcl] Other (See Comments)    SHAKING  . Penicillins Rash  . Prednisone Rash    HOME MEDICATIONS: Outpatient Prescriptions Prior to Visit  Medication Sig Dispense Refill  . albuterol (PROVENTIL HFA;VENTOLIN HFA) 108 (90 BASE) MCG/ACT inhaler Inhale 1 puff into the lungs every 6 (six) hours as needed. Shortness of breath  1 Inhaler  11  . aspirin 81 MG tablet Take 81 mg by mouth daily.        . Calcium Carbonate-Vit D-Min (CALCIUM 1200 PO) Take  1,200 mg by mouth daily. (slow release)      . CARTIA XT 240 MG 24 hr capsule TAKE 1 CAPSULE BY MOUTH EVERY DAY  30 capsule  3  . clobetasol cream (TEMOVATE) 7.82 % Apply 1 application topically as needed.       Marland Kitchen FLUoxetine (PROZAC) 20 MG capsule Take 20 mg by mouth Daily.      . fluticasone (FLONASE) 50 MCG/ACT nasal spray Place 1 spray into both nostrils daily.  16 g  11  .  Fluticasone-Salmeterol (ADVAIR DISKUS) 250-50 MCG/DOSE AEPB Inhale 1 puff into the lungs 2 (two) times daily.  60 each  11  . loratadine-pseudoephedrine (CLARITIN-D 12-HOUR) 5-120 MG per tablet Take 1 tablet by mouth daily.      Marland Kitchen LORazepam (ATIVAN) 0.5 MG tablet Take 0.5-1 mg by mouth as needed. Anxiety      . losartan (COZAAR) 50 MG tablet Take 50 mg by mouth Daily.      . Multiple Vitamins-Minerals (OCUVITE PO) Take 1 tablet by mouth daily.       Marland Kitchen omeprazole (PRILOSEC) 20 MG capsule Take 20 mg by mouth Nightly.        . pravastatin (PRAVACHOL) 40 MG tablet Take 40 mg by mouth at bedtime.       . zaleplon (SONATA) 5 MG capsule Take 5 mg by mouth daily.       No facility-administered medications prior to visit.    PHYSICAL EXAM Filed Vitals:   03/16/14 0919  BP: 126/66  Pulse: 79  Height: 5' 3.5" (1.613 m)  Weight: 217 lb 12.8 oz (98.793 kg)   Body mass index is 37.97 kg/(m^2).  GENERAL EXAM:  Patient is in no distress; well developed, nourished and groomed; neck is supple  CARDIOVASCULAR:  Regular rate and rhythm, no murmurs, no carotid bruits  NEUROLOGIC:  MENTAL STATUS: awake, alert, oriented to person, place and time, recent and remote memory intact, normal attention and concentration, language fluent, comprehension intact, naming intact, fund of knowledge appropriate  CRANIAL NERVE: no papilledema on fundoscopic exam, pupils equal and reactive to light, visual fields full to confrontation, extraocular muscles intact, no nystagmus, facial sensation and strength symmetric, hearing intact, palate elevates symmetrically, uvula midline, shoulder shrug symmetric, tongue midline.  MOTOR: normal bulk and tone, MILD POSTURAL AND ACTION TREMOR IN BUE. NO RIGIDITY. NO BRADYKINESIA. Full strength in the BUE, BLE; EXCEPT BILATERAL KE, KF 4+; BILATERAL DF 3+.  SENSORY: DECR PP, TEMP IN FEET UP TO KNEES. VIB 3 SEC AT TOES. ABSENT PROPRIO AT TOES.  COORDINATION: finger-nose-finger, fine finger  movements normal  REFLEXES: BUE 1, KNEES TRACEM ANKLES 0.  GAIT/STATION: SLOW CAUTIOUS GAIT. CANNOT STAND ON HEELS. ABLE TO TAKE STEPS ON TOES. CANNOT TANDEM. UNSTEADY WITH EYES OPEN AND FEET TOGETHER.   02/10/09 MRI LUMBAR  - At L4-5 there is left lateral recess stenosis and moderate spinal stenosis due to left greater than right facet and ligamentum flavum hypertrophy with circumferential disc bulging. This results in potential impingement upon the descending left L5 nerve root.   01/27/09 EMG/NCS - severe length-dependent, axonal, sensorimotor polyneuropathy with evidence of chronic denervation.   01/18/09 NEUROPATHY LABS  - SPEP, anti-HU, anti-Yo, SSA, SSB, ANA, RPR, cryoglobulins, B6, urine heavy metals, glucose (fasting and 2 hour post-prandial), MMA, homocysteine, B12, TSH - ALL NORMAL   - Vit E - 16.7 (H) - (normal range 3.0-15.8)   ASSESSMENT: 68 y.o. year old female here with idiopathic neuropathy and lumbar radiculopathy/spinal stenosis. Mild elevation  of Vit E; other labs normal. No pain. Mild/mod weakness. No recent falls but poor balance. Patient and I are reluctant to pursue surgical mgmt of lumbar spine issues, because she has concomitant severe neuropathy, which would make her recovery more challenging/limited.  PLAN:  Hold off on PT evaluation at this time, patient does not feel as though she needs it at this time; use cane or walker if needed. We will continue conservative mgmt. Return in about 6-12 months, sooner as needed.  Rudi Rummage [name] Legros, MSN, FNP-BC, A/GNP-C 03/16/2014, 9:33 AM [facility] [address], [place], [place] [address] [phone]  Note: This document was prepared with digital dictation and possible smart phrase technology. Any transcriptional errors that result from this process are unintentional.

## 2014-03-16 NOTE — Patient Instructions (Addendum)
Continue to be very careful walking and wear supportive shoes. You have severe neuropathy in your feet without pain.  Spinal stenosis in the lumbar region is moderate by MRI in 2010, it may worsen over time, causing numbness and pain in your legs. We will continue to watch you over time.  Follow up in 6 months with Dr. Colen Darling, call sooner as needed.

## 2014-03-20 ENCOUNTER — Encounter: Payer: Self-pay | Admitting: Nurse Practitioner

## 2014-04-05 ENCOUNTER — Encounter (INDEPENDENT_AMBULATORY_CARE_PROVIDER_SITE_OTHER): Payer: Medicare Other | Admitting: Ophthalmology

## 2014-04-05 DIAGNOSIS — I10 Essential (primary) hypertension: Secondary | ICD-10-CM

## 2014-04-05 DIAGNOSIS — H3532 Exudative age-related macular degeneration: Secondary | ICD-10-CM | POA: Diagnosis not present

## 2014-04-05 DIAGNOSIS — H3531 Nonexudative age-related macular degeneration: Secondary | ICD-10-CM | POA: Diagnosis not present

## 2014-04-05 DIAGNOSIS — H43813 Vitreous degeneration, bilateral: Secondary | ICD-10-CM | POA: Diagnosis not present

## 2014-04-05 DIAGNOSIS — H35033 Hypertensive retinopathy, bilateral: Secondary | ICD-10-CM

## 2014-04-19 ENCOUNTER — Encounter: Payer: Self-pay | Admitting: Adult Health

## 2014-04-19 ENCOUNTER — Ambulatory Visit (INDEPENDENT_AMBULATORY_CARE_PROVIDER_SITE_OTHER): Payer: Medicare Other | Admitting: Adult Health

## 2014-04-19 VITALS — BP 128/66 | HR 93 | Temp 98.2°F | Ht 63.5 in | Wt 222.2 lb

## 2014-04-19 DIAGNOSIS — J209 Acute bronchitis, unspecified: Secondary | ICD-10-CM | POA: Diagnosis not present

## 2014-04-19 MED ORDER — AZITHROMYCIN 250 MG PO TABS: ORAL_TABLET | ORAL | Status: AC

## 2014-04-19 NOTE — Patient Instructions (Signed)
Zpack take as directed  Mucinex Twice daily  As needed  Cough/congestion  Fluids and rest  Delsym 2 tsp Twice daily  As needed  Cough  Claritin daily as needed for drainage  Saline nasal rinses As needed   Tylenol As needed   Please contact office for sooner follow up if symptoms do not improve or worsen or seek emergency care  Follow up Dr. Lamonte Sakai  As planned

## 2014-04-19 NOTE — Assessment & Plan Note (Signed)
Flare   Plan  Zpack take as directed  Mucinex Twice daily  As needed  Cough/congestion  Fluids and rest  Delsym 2 tsp Twice daily  As needed  Cough  Claritin daily as needed for drainage  Saline nasal rinses As needed   Tylenol As needed   Please contact office for sooner follow up if symptoms do not improve or worsen or seek emergency care  Follow up Dr. Lamonte Sakai  As planned

## 2014-04-19 NOTE — Progress Notes (Signed)
Subjective:    Patient ID: [name] Molina, female    DOB: [birth year], 68 y.o.   MRN: [id number] HPI 68 yo woman, hx former tobacco and childhood asthma. Formerly followed by Dr Alva Garnet for mild COPD. Also hx GERD, HTN.  She was managed on Advair for years, but now that she is on Medicare she is looking for a less expensive option. She stopped the Advair in November.  Also, since September she has had much more trouble with nasal gtt, throat irritation, cough, throat clearing. More SABA use.   ROV 07/22/11 -- OSA/OHS, COPD, allergies and chronic cough.  Continued singulair, restarted allegra and added Qnasl. She is taking symbicort bid, needs to get financial assistance w this. Her nasal gtt is a lot better now, in fact her nose is dry on the Qnasl. Her breathing seems to be better also.   ROV 09/09/11 -- OSA/OHS, COPD, allergies and cough. We stopped Qnasl due to dryness, tried stopping Symbicort but needed to restart it. She has since run out and is ready to start Dulera 1 puff bid, possibly uptitrate to 2 puffs bid. She is on Nature conservation officer. No exacerbations since last time   ROV 01/07/12 -- OSA/OHS, COPD, allergies and cough.  She had been on Dulera due to cost, but now wants to go back to Advair - same cost. She believes she can stop the singulair if she is back on Advair. She is benefiting from [employer].    ROV 07/13/12 -- OSA/OHS, COPD, allergies and cough. Returns today for f/u - we changed Dulera back to Advair last visit (started 10/13). She had been doing well until about a week ago when she started having nasal gtt, some scratchy voice, occas cough. She stopped singulair.   ROV 11/13/12 -- OSA/OHS, COPD, allergies and cough.  She took singulair for a while last time, now is off of it. She is taking generic allegra. Her allergies are well controlled, cough is better. Uses albuterol rarely. No flares since last time.   ROV 06/04/13 -- OSA/OHS, COPD, allergies and cough. She had a URI in 9/'14  and was treated for an AE by Dr Gwenette Greet.  She is on Advair + albuterol prn, uses rarely. She is taking generic zyrtec right now (not fexofenadine). Not on singulair. No other flares. Has been active.   ROV 08/17/13 -- OSA/OHS, COPD, allergies and cough. She has frequent flares of predominantly UA disease > has been seen by Dr Elsworth Soho and by TP since last visit for probable influenza. Presents today with significant improvement. She had some wheeze, continues to have some nasal drainage. She has run out of loratadine, needs to restart. She still takes Advair, uses albuterol prn, about once a week.   ROV 12/23/13 -- OSA/OHS, COPD, allergies and cough, UA irritation.  She has been doing fairly well. She has been on Advair, uses albuterol prn - a few times a month. No flares since last time. Her diltiazem was recently increased.  She had the prevnar at Dr Orest Dikes office this year.   04/19/2014 Acute OV  OSA/OHS, COPD, allergies and cough, UA irritation. Complains of increased SOB, prod cough with green mucus, soreness/tightness in upper chest x4 days.   Denies any f/c/s, n/v/d, hemoptysis, chest pain, orthopnea.  Take mucinex without much help.      Objective:   Physical Exam Filed Vitals:   04/19/14 1051  BP: 128/66  Pulse: 93  Temp: 98.2 F (36.8 C)  TempSrc: Oral  Height:  5' 3.5" (1.613 m)  Weight: 222 lb 3.2 oz (100.789 kg)  SpO2: 95%   Gen: Pleasant,  in no distress,  normal affect  ENT: No lesions,  mouth clear,  oropharynx clear, no postnasal drip  Neck: No JVD, no TMG, no carotid bruits  Lungs: No use of accessory muscles, no dullness to percussion, clear without rales or rhonchi  Cardiovascular: RRR, heart sounds normal, no murmur or gallops, no peripheral edema  Musculoskeletal: No deformities, no cyanosis or clubbing  Neuro: alert, non focal  Skin: Warm, no lesions or rashes     Assessment & Plan:  No problem-specific assessment & plan notes found for this encounter.

## 2014-04-25 NOTE — Progress Notes (Signed)
I reviewed note and agree with plan.   Isaack Preble R. Rashad Obeid, MD  Certified in Neurology, Neurophysiology and Neuroimaging  [facility] [address], [address] [place], [place] [address] [phone]   

## 2014-05-20 DIAGNOSIS — B349 Viral infection, unspecified: Secondary | ICD-10-CM | POA: Diagnosis not present

## 2014-05-22 ENCOUNTER — Encounter (HOSPITAL_COMMUNITY): Payer: Self-pay | Admitting: Emergency Medicine

## 2014-05-22 ENCOUNTER — Inpatient Hospital Stay (HOSPITAL_COMMUNITY)
Admission: EM | Admit: 2014-05-22 | Discharge: 2014-05-26 | DRG: 194 | Disposition: A | Discharge status: Home / Self Care | Payer: Medicare Other | Attending: Internal Medicine | Admitting: Internal Medicine

## 2014-05-22 ENCOUNTER — Emergency Department (HOSPITAL_COMMUNITY): Payer: Medicare Other

## 2014-05-22 DIAGNOSIS — Z880 Allergy status to penicillin: Secondary | ICD-10-CM | POA: Diagnosis not present

## 2014-05-22 DIAGNOSIS — R0902 Hypoxemia: Secondary | ICD-10-CM | POA: Diagnosis present

## 2014-05-22 DIAGNOSIS — Z87891 Personal history of nicotine dependence: Secondary | ICD-10-CM

## 2014-05-22 DIAGNOSIS — J189 Pneumonia, unspecified organism: Secondary | ICD-10-CM | POA: Diagnosis not present

## 2014-05-22 DIAGNOSIS — Z881 Allergy status to other antibiotic agents status: Secondary | ICD-10-CM | POA: Diagnosis not present

## 2014-05-22 DIAGNOSIS — J45909 Unspecified asthma, uncomplicated: Secondary | ICD-10-CM | POA: Diagnosis present

## 2014-05-22 DIAGNOSIS — J9811 Atelectasis: Secondary | ICD-10-CM | POA: Diagnosis not present

## 2014-05-22 DIAGNOSIS — J441 Chronic obstructive pulmonary disease with (acute) exacerbation: Secondary | ICD-10-CM | POA: Diagnosis present

## 2014-05-22 DIAGNOSIS — Z7982 Long term (current) use of aspirin: Secondary | ICD-10-CM

## 2014-05-22 DIAGNOSIS — R059 Cough, unspecified: Secondary | ICD-10-CM

## 2014-05-22 DIAGNOSIS — G43909 Migraine, unspecified, not intractable, without status migrainosus: Secondary | ICD-10-CM | POA: Diagnosis present

## 2014-05-22 DIAGNOSIS — G629 Polyneuropathy, unspecified: Secondary | ICD-10-CM | POA: Diagnosis present

## 2014-05-22 DIAGNOSIS — I10 Essential (primary) hypertension: Secondary | ICD-10-CM | POA: Diagnosis not present

## 2014-05-22 DIAGNOSIS — E785 Hyperlipidemia, unspecified: Secondary | ICD-10-CM | POA: Diagnosis not present

## 2014-05-22 DIAGNOSIS — Z886 Allergy status to analgesic agent status: Secondary | ICD-10-CM

## 2014-05-22 DIAGNOSIS — F419 Anxiety disorder, unspecified: Secondary | ICD-10-CM | POA: Diagnosis present

## 2014-05-22 DIAGNOSIS — K219 Gastro-esophageal reflux disease without esophagitis: Secondary | ICD-10-CM | POA: Diagnosis not present

## 2014-05-22 DIAGNOSIS — Z91018 Allergy to other foods: Secondary | ICD-10-CM | POA: Diagnosis not present

## 2014-05-22 DIAGNOSIS — J180 Bronchopneumonia, unspecified organism: Secondary | ICD-10-CM | POA: Diagnosis not present

## 2014-05-22 DIAGNOSIS — E782 Mixed hyperlipidemia: Secondary | ICD-10-CM | POA: Diagnosis present

## 2014-05-22 DIAGNOSIS — H3530 Unspecified macular degeneration: Secondary | ICD-10-CM | POA: Diagnosis present

## 2014-05-22 DIAGNOSIS — R05 Cough: Secondary | ICD-10-CM | POA: Diagnosis not present

## 2014-05-22 DIAGNOSIS — J309 Allergic rhinitis, unspecified: Secondary | ICD-10-CM | POA: Diagnosis not present

## 2014-05-22 DIAGNOSIS — R0602 Shortness of breath: Secondary | ICD-10-CM

## 2014-05-22 HISTORY — DX: Unspecified macular degeneration: H35.30

## 2014-05-22 HISTORY — DX: Migraine, unspecified, not intractable, without status migrainosus: G43.909

## 2014-05-22 LAB — BASIC METABOLIC PANEL
ANION GAP: 6 (ref 5–15)
BUN: 10 mg/dL (ref 6–23)
CALCIUM: 8.6 mg/dL (ref 8.4–10.5)
CO2: 23 mmol/L (ref 19–32)
CREATININE: 0.69 mg/dL (ref 0.50–1.10)
Chloride: 108 mEq/L (ref 96–112)
GFR, EST NON AFRICAN AMERICAN: 87 mL/min — AB (ref >90)
Glucose, Bld: 105 mg/dL — ABNORMAL HIGH (ref 70–99)
Potassium: 4 mmol/L (ref 3.5–5.1)
SODIUM: 137 mmol/L (ref 135–145)

## 2014-05-22 LAB — CBC
HCT: 35.2 % — ABNORMAL LOW (ref 36.0–46.0)
HEMATOCRIT: 33.8 % — AB (ref 36.0–46.0)
Hemoglobin: 10.5 g/dL — ABNORMAL LOW (ref 12.0–15.0)
Hemoglobin: 10.1 g/dL — ABNORMAL LOW (ref 12.0–15.0)
MCH: 23.6 pg — AB (ref 26.0–34.0)
MCH: 23.5 pg — AB (ref 26.0–34.0)
MCHC: 29.8 g/dL — ABNORMAL LOW (ref 30.0–36.0)
MCHC: 29.9 g/dL — ABNORMAL LOW (ref 30.0–36.0)
MCV: 79.1 fL (ref 78.0–100.0)
MCV: 78.8 fL (ref 78.0–100.0)
PLATELETS: 266 10*3/uL (ref 150–400)
Platelets: 278 10*3/uL (ref 150–400)
RBC: 4.45 MIL/uL (ref 3.87–5.11)
RBC: 4.29 MIL/uL (ref 3.87–5.11)
RDW: 17.8 % — AB (ref 11.5–15.5)
RDW: 17.7 % — AB (ref 11.5–15.5)
WBC: 8.7 10*3/uL (ref 4.0–10.5)
WBC: 8.2 10*3/uL (ref 4.0–10.5)

## 2014-05-22 LAB — CREATININE, SERUM
CREATININE: 0.61 mg/dL (ref 0.50–1.10)
GFR calc Af Amer: >90 mL/min (ref >90)
GFR calc non Af Amer: >90 mL/min (ref >90)

## 2014-05-22 LAB — I-STAT TROPONIN, ED: TROPONIN I, POC: 0 ng/mL (ref 0.00–0.08)

## 2014-05-22 MED ORDER — SODIUM CHLORIDE 0.9 % IJ SOLN: 3.0000 mL | INTRAMUSCULAR | Status: DC | PRN

## 2014-05-22 MED ORDER — PSEUDOEPHEDRINE HCL ER 120 MG PO TB12
120.0000 mg | ORAL_TABLET | Freq: Every day | ORAL | Status: DC
  Administered 2014-05-23 – 2014-05-26 (×4): 120 mg via ORAL
  Filled 2014-05-22 (×4): qty 1

## 2014-05-22 MED ORDER — LORAZEPAM 0.5 MG PO TABS: 0.5000 mg | ORAL_TABLET | Freq: Every day | ORAL | Status: DC | PRN

## 2014-05-22 MED ORDER — DOXYCYCLINE HYCLATE 100 MG PO TABS
100.0000 mg | ORAL_TABLET | Freq: Once | ORAL | Status: AC
  Administered 2014-05-22: 100 mg via ORAL
  Filled 2014-05-22: qty 1

## 2014-05-22 MED ORDER — MONTELUKAST SODIUM 10 MG PO TABS
10.0000 mg | ORAL_TABLET | Freq: Every day | ORAL | Status: DC
  Filled 2014-05-22: qty 1

## 2014-05-22 MED ORDER — LORATADINE 10 MG PO TABS
5.0000 mg | ORAL_TABLET | Freq: Every day | ORAL | Status: DC
  Administered 2014-05-23 – 2014-05-26 (×4): 5 mg via ORAL
  Filled 2014-05-22 (×4): qty 0.5

## 2014-05-22 MED ORDER — FLUTICASONE PROPIONATE 50 MCG/ACT NA SUSP
1.0000 | Freq: Every day | NASAL | Status: DC
  Administered 2014-05-23 – 2014-05-26 (×4): 1 via NASAL
  Filled 2014-05-22: qty 16

## 2014-05-22 MED ORDER — ENOXAPARIN SODIUM 40 MG/0.4ML ~~LOC~~ SOLN
40.0000 mg | SUBCUTANEOUS | Status: DC
  Administered 2014-05-22 – 2014-05-25 (×4): 40 mg via SUBCUTANEOUS
  Filled 2014-05-22 (×5): qty 0.4

## 2014-05-22 MED ORDER — SODIUM CHLORIDE 0.9 % IJ SOLN
3.0000 mL | Freq: Two times a day (BID) | INTRAMUSCULAR | Status: DC
  Administered 2014-05-22 – 2014-05-25 (×5): 3 mL via INTRAVENOUS

## 2014-05-22 MED ORDER — CLOBETASOL PROPIONATE 0.05 % EX CREA: 1.0000 "application " | TOPICAL_CREAM | Freq: Every day | CUTANEOUS | Status: DC | PRN

## 2014-05-22 MED ORDER — ZOLPIDEM TARTRATE 5 MG PO TABS
5.0000 mg | ORAL_TABLET | Freq: Every evening | ORAL | Status: DC | PRN
  Administered 2014-05-22 – 2014-05-25 (×4): 5 mg via ORAL
  Filled 2014-05-22 (×4): qty 1

## 2014-05-22 MED ORDER — GUAIFENESIN-DM 100-10 MG/5ML PO SYRP: 5.0000 mL | ORAL_SOLUTION | ORAL | Status: DC | PRN

## 2014-05-22 MED ORDER — OCUVITE PO TABS
1.0000 | ORAL_TABLET | Freq: Every day | ORAL | Status: DC
  Administered 2014-05-22 – 2014-05-26 (×5): 1 via ORAL
  Filled 2014-05-22 (×5): qty 1

## 2014-05-22 MED ORDER — MONTELUKAST SODIUM 10 MG PO TABS
10.0000 mg | ORAL_TABLET | Freq: Every day | ORAL | Status: DC
  Administered 2014-05-22 – 2014-05-25 (×4): 10 mg via ORAL
  Filled 2014-05-22 (×5): qty 1

## 2014-05-22 MED ORDER — METHYLPREDNISOLONE SODIUM SUCC 40 MG IJ SOLR
40.0000 mg | Freq: Two times a day (BID) | INTRAMUSCULAR | Status: DC
  Administered 2014-05-23: 40 mg via INTRAVENOUS
  Filled 2014-05-22 (×3): qty 1

## 2014-05-22 MED ORDER — AZITHROMYCIN 500 MG IV SOLR
500.0000 mg | INTRAVENOUS | Status: DC
  Administered 2014-05-23 – 2014-05-25 (×3): 500 mg via INTRAVENOUS
  Filled 2014-05-22 (×4): qty 500

## 2014-05-22 MED ORDER — LORATADINE-PSEUDOEPHEDRINE ER 5-120 MG PO TB12: 1.0000 | ORAL_TABLET | Freq: Every day | ORAL | Status: DC

## 2014-05-22 MED ORDER — PRAVASTATIN SODIUM 40 MG PO TABS
40.0000 mg | ORAL_TABLET | Freq: Every day | ORAL | Status: DC
  Administered 2014-05-22 – 2014-05-25 (×4): 40 mg via ORAL
  Filled 2014-05-22 (×5): qty 1

## 2014-05-22 MED ORDER — SODIUM CHLORIDE 0.9 % IV BOLUS (SEPSIS)
500.0000 mL | Freq: Once | INTRAVENOUS | Status: AC
  Administered 2014-05-22: 500 mL via INTRAVENOUS

## 2014-05-22 MED ORDER — METHYLPREDNISOLONE SODIUM SUCC 40 MG IJ SOLR
40.0000 mg | Freq: Once | INTRAMUSCULAR | Status: AC
  Administered 2014-05-22: 40 mg via INTRAVENOUS
  Filled 2014-05-22: qty 1

## 2014-05-22 MED ORDER — ASPIRIN 81 MG PO TABS: 81.0000 mg | ORAL_TABLET | Freq: Every day | ORAL | Status: DC

## 2014-05-22 MED ORDER — MOMETASONE FURO-FORMOTEROL FUM 100-5 MCG/ACT IN AERO
2.0000 | INHALATION_SPRAY | Freq: Two times a day (BID) | RESPIRATORY_TRACT | Status: DC
  Administered 2014-05-22 – 2014-05-26 (×8): 2 via RESPIRATORY_TRACT
  Filled 2014-05-22: qty 8.8

## 2014-05-22 MED ORDER — CEFTRIAXONE SODIUM 1 G IJ SOLR
1.0000 g | Freq: Once | INTRAMUSCULAR | Status: AC
  Administered 2014-05-22: 1 g via INTRAVENOUS
  Filled 2014-05-22: qty 10

## 2014-05-22 MED ORDER — GUAIFENESIN ER 600 MG PO TB12
600.0000 mg | ORAL_TABLET | Freq: Two times a day (BID) | ORAL | Status: DC
  Administered 2014-05-22 – 2014-05-26 (×8): 600 mg via ORAL
  Filled 2014-05-22 (×9): qty 1

## 2014-05-22 MED ORDER — IBUPROFEN 200 MG PO TABS: 400.0000 mg | ORAL_TABLET | Freq: Four times a day (QID) | ORAL | Status: DC | PRN

## 2014-05-22 MED ORDER — LOSARTAN POTASSIUM 50 MG PO TABS
50.0000 mg | ORAL_TABLET | Freq: Every day | ORAL | Status: DC
  Administered 2014-05-23 – 2014-05-26 (×4): 50 mg via ORAL
  Filled 2014-05-22 (×4): qty 1

## 2014-05-22 MED ORDER — ASPIRIN 81 MG PO CHEW
81.0000 mg | CHEWABLE_TABLET | Freq: Every day | ORAL | Status: DC
  Administered 2014-05-22 – 2014-05-26 (×5): 81 mg via ORAL
  Filled 2014-05-22 (×5): qty 1

## 2014-05-22 MED ORDER — PANTOPRAZOLE SODIUM 40 MG PO TBEC
40.0000 mg | DELAYED_RELEASE_TABLET | Freq: Every day | ORAL | Status: DC
  Administered 2014-05-22 – 2014-05-26 (×5): 40 mg via ORAL
  Filled 2014-05-22 (×5): qty 1

## 2014-05-22 MED ORDER — IPRATROPIUM-ALBUTEROL 0.5-2.5 (3) MG/3ML IN SOLN
3.0000 mL | Freq: Four times a day (QID) | RESPIRATORY_TRACT | Status: DC
  Administered 2014-05-22 – 2014-05-23 (×3): 3 mL via RESPIRATORY_TRACT
  Filled 2014-05-22 (×3): qty 3

## 2014-05-22 MED ORDER — DEXTROSE 5 % IV SOLN
500.0000 mg | Freq: Once | INTRAVENOUS | Status: AC
  Administered 2014-05-22: 500 mg via INTRAVENOUS
  Filled 2014-05-22: qty 500

## 2014-05-22 MED ORDER — FLUOXETINE HCL 20 MG PO CAPS
20.0000 mg | ORAL_CAPSULE | Freq: Every day | ORAL | Status: DC
  Administered 2014-05-23 – 2014-05-26 (×4): 20 mg via ORAL
  Filled 2014-05-22 (×4): qty 1

## 2014-05-22 MED ORDER — CEFTRIAXONE SODIUM IN DEXTROSE 20 MG/ML IV SOLN
1.0000 g | INTRAVENOUS | Status: DC
  Filled 2014-05-22: qty 50

## 2014-05-22 MED ORDER — DEXTROSE 5 % IV SOLN
500.0000 mg | INTRAVENOUS | Status: DC
  Filled 2014-05-22: qty 500

## 2014-05-22 MED ORDER — ACETAMINOPHEN 500 MG PO TABS
1000.0000 mg | ORAL_TABLET | Freq: Four times a day (QID) | ORAL | Status: DC | PRN
  Administered 2014-05-22: 1000 mg via ORAL
  Filled 2014-05-22: qty 2

## 2014-05-22 MED ORDER — CEFTRIAXONE SODIUM IN DEXTROSE 20 MG/ML IV SOLN
1.0000 g | INTRAVENOUS | Status: DC
  Administered 2014-05-23 – 2014-05-25 (×3): 1 g via INTRAVENOUS
  Filled 2014-05-22 (×3): qty 50

## 2014-05-22 MED ORDER — DILTIAZEM HCL ER COATED BEADS 240 MG PO CP24
240.0000 mg | ORAL_CAPSULE | Freq: Every day | ORAL | Status: DC
  Administered 2014-05-23 – 2014-05-26 (×4): 240 mg via ORAL
  Filled 2014-05-22 (×4): qty 1

## 2014-05-22 MED ORDER — SODIUM CHLORIDE 0.9 % IV SOLN
250.0000 mL | INTRAVENOUS | Status: DC | PRN
Start: 2014-05-22 — End: 2014-05-26

## 2014-05-22 NOTE — H&P (Signed)
History and Physical:    [name] Molina UUV:[id number] DOB: 1945/11/25 DOA: 05/22/2014  Referring physician: Dr. Ralene Bathe PCP: [name], MD   Chief Complaint: Fever, cough, dyspnea  History of Present Illness:   [name] Molina is an 69 y.o. female with a PMH of COPD, asthma, anxiety, HTN who was sent here from a walk in clinic for further evaluation and treatment of newly diagnosed PNA. She has multiple antibiotic allergies which limit outpatient treatment.  Patient states she went to [place] days ago to visit friends. While she was down there, she developed URI symptoms with a 5 day history of symptoms which began with myalgias and chills, progressive dyspnea, cough productive of green sputum, rhinorrhea with clear/bloody/green drainage. She continues to complain of persistent cough, and has been febrile with chills, body aches, and fatigue. She also endorsed increased shortness of breath with exertion. After being evaluated in the walk-in clinic with a chest x-ray, which showed evidence of pneumonia, she was referred to the emergency department for further evaluation and treatment with IV antibiotics. Upon initial evaluation in the ED, she was found to be hypoxic with saturation of 91% on room air.  Repeat chest x-ray shows worsening pneumonia. Temperature is 99.71F, and heart rate is 110, otherwise vital signs unremarkable.  ROS:   Constitutional: + fever, + chills;  Appetite diminished; + weight loss, no weight gain, + fatigue.  HEENT: No blurry vision, no diplopia, no pharyngitis, no dysphagia CV: No chest pain, no palpitations, no PND, no orthopnea, no edema.  Resp: + SOB, + cough, no pleuritic pain. GI: + nausea, no vomiting, no diarrhea, no melena, no hematochezia, no constipation, no abdominal pain.  GU: No dysuria, no hematuria, no frequency, no urgency. MSK: + myalgias, no arthralgias.  Neuro:  + headache, no focal neurological deficits, no history of seizures.  Psych: No  depression, + anxiety.  Endo: No heat intolerance, no cold intolerance, no polyuria, no polydipsia  Skin: No rashes, no skin lesions.  Heme: No easy bruising.  Travel history: [place].   Past Medical History:   Past Medical History  Diagnosis Date  . COPD (chronic obstructive pulmonary disease)   . Asthma   . GERD (gastroesophageal reflux disease)   . Hyperlipidemia   . Hypertension   . Atrial tachycardia   . Diverticulosis   . Insomnia   . Anxiety   . Neuropathy   . Macular degeneration   . Migraines     Past Surgical History:   Past Surgical History  Procedure Laterality Date  . Colonoscopy    . Removed cartliage rib    . Polypectomy    . Cervical polypectomy      INSIDE AND OUTSIDE    Social History:   History   Social History  . Marital Status: Divorced    Spouse Name: N/A    Number of Children: 3  . Years of Education: College   Occupational History  . Emergency planning/management officer    Social History Main Topics  . Smoking status: Former Smoker -- 1.00 packs/day for 20 years    Types: Cigarettes    Quit date: 05/21/1991  . Smokeless tobacco: Never Used  . Alcohol Use: No  . Drug Use: No  . Sexual Activity: Not on file   Other Topics Concern  . Not on file   Social History Narrative   Patient is divorced with 3 children.   Patient is right handed.   Patient has a college  education.   Patient drinks 1-2 servings daily.    Family history:   Family History  Problem Relation Age of Onset  . Colon polyps Sister 68    PRECANCEROUS POLYPS  . Breast cancer Maternal Grandmother 76  . Emphysema Father   . Asthma Father   . Heart disease Father   . Heart disease Paternal Grandfather     Allergies   Food; Tequin; Codeine; Erythromycin; Wellbutrin; Penicillins; and Prednisone  Current Medications:   Prior to Admission medications   Medication Sig Start Date End Date Taking? Authorizing Provider  acetaminophen (TYLENOL) 500 MG tablet Take 1,000 mg by  mouth every 6 (six) hours as needed for moderate pain or headache.   Yes Historical Provider, MD  albuterol (PROVENTIL HFA;VENTOLIN HFA) 108 (90 BASE) MCG/ACT inhaler Inhale 1 puff into the lungs every 6 (six) hours as needed. Shortness of breath 06/04/13  Yes Collene Gobble, MD  aspirin 81 MG tablet Take 81 mg by mouth daily.     Yes Historical Provider, MD  aspirin-acetaminophen-caffeine (EXCEDRIN MIGRAINE) [phone] MG per tablet Take 2 tablets by mouth every 6 (six) hours as needed for headache.   Yes Historical Provider, MD  CARTIA XT 240 MG 24 hr capsule TAKE 1 CAPSULE BY MOUTH EVERY DAY 02/04/14  Yes Candee Furbish, MD  clobetasol cream (TEMOVATE) 4.00 % Apply 1 application topically daily as needed (yeast.).  11/01/13  Yes Historical Provider, MD  FLUoxetine (PROZAC) 20 MG capsule Take 20 mg by mouth Daily. 10/23/10  Yes Historical Provider, MD  fluticasone (FLONASE) 50 MCG/ACT nasal spray Place 1 spray into both nostrils daily. 08/17/13  Yes Collene Gobble, MD  Fluticasone-Salmeterol (ADVAIR DISKUS) 250-50 MCG/DOSE AEPB Inhale 1 puff into the lungs 2 (two) times daily. 01/07/12  Yes Collene Gobble, MD  ibuprofen (ADVIL,MOTRIN) 200 MG tablet Take 400 mg by mouth every 6 (six) hours as needed for headache or moderate pain.   Yes Historical Provider, MD  loratadine-pseudoephedrine (CLARITIN-D 12-HOUR) 5-120 MG per tablet Take 1 tablet by mouth daily.   Yes Historical Provider, MD  LORazepam (ATIVAN) 0.5 MG tablet Take 0.5-1 mg by mouth daily as needed for anxiety. Anxiety   Yes Historical Provider, MD  losartan (COZAAR) 50 MG tablet Take 50 mg by mouth Daily. 10/27/10  Yes Historical Provider, MD  montelukast (SINGULAIR) 10 MG tablet Take 10 mg by mouth daily. 02/21/14  Yes Historical Provider, MD  Multiple Vitamins-Minerals (OCUVITE PO) Take 1 tablet by mouth daily.    Yes Historical Provider, MD  omeprazole (PRILOSEC) 20 MG capsule Take 20 mg by mouth Nightly.     Yes Historical Provider, MD  pravastatin  (PRAVACHOL) 40 MG tablet Take 40 mg by mouth at bedtime.  09/04/10  Yes Historical Provider, MD  zaleplon (SONATA) 5 MG capsule Take 5 mg by mouth daily. 08/31/13  Yes Historical Provider, MD    Physical Exam:   Filed Vitals:   05/22/14 1602 05/22/14 1614 05/22/14 1749  BP: 155/61  140/60  Pulse: 107  110  Temp: 99.3 F (37.4 C)    TempSrc: Oral    Resp: 17  22  SpO2: 91% 91% 93%     Physical Exam: Blood pressure 140/60, pulse 110, temperature 99.3 F (37.4 C), temperature source Oral, resp. rate 22, SpO2 93 %. Gen: No acute distress. Head: Normocephalic, atraumatic. Eyes: PERRL, EOMI, sclerae nonicteric. Mouth: Oropharynx clear. Neck: Supple, no thyromegaly, no lymphadenopathy, no jugular venous distention. Chest: Lungs diminished with a few  expiratory wheezes. CV: Heart sounds are tachycardic, regular with no murmurs, rubs, or gallops. Abdomen: Soft, nontender, nondistended with normal active bowel sounds. Extremities: Extremities show trace edema bilaterally. Skin: Warm and dry. Neuro: Alert and oriented times 3; cranial nerves II through XII grossly intact. Psych: Mood and affect normal.   Data Review:    Labs: Basic Metabolic Panel:  Recent Labs Lab 05/22/14 1626  NA 137  K 4.0  CL 108  CO2 23  GLUCOSE 105*  BUN 10  CREATININE 0.69  CALCIUM 8.6   Liver Function Tests: No results for input(s): AST, ALT, ALKPHOS, BILITOT, PROT, ALBUMIN in the last 168 hours. No results for input(s): LIPASE, AMYLASE in the last 168 hours. No results for input(s): AMMONIA in the last 168 hours. CBC:  Recent Labs Lab 05/22/14 1626  WBC 8.7  HGB 10.5*  HCT 35.2*  MCV 79.1  PLT 266   Cardiac Enzymes: No results for input(s): CKTOTAL, CKMB, CKMBINDEX, TROPONINI in the last 168 hours.  BNP (last 3 results) No results for input(s): PROBNP in the last 8760 hours. CBG: No results for input(s): GLUCAP in the last 168 hours.  Radiographic Studies: Dg Chest 2  View  05/22/2014   CLINICAL DATA:  Five day history of cough, intermittent fever and dizziness. Former smoker with current history of COPD.  EXAM: CHEST  2 VIEW 1726 hr:  COMPARISON:  Two-view x-ray earlier same date from [place] at [facility]. Two-view chest x-ray 07/27/2013, 04/04/2008, 08/18/2006.  FINDINGS: Since the outside examination earlier same date, further increase in the patchy airspace opacities in the right upper lobe, now with evidence of patchy opacities in the right lower lobe as well. Left lung remains clear. Small right pleural effusion. No pneumothorax. Visualized bony thorax intact.  IMPRESSION: Progressive right lung pneumonia since earlier today, now with involvement of the right upper lobe and right lower lobe.   Electronically Signed   By: Evangeline Dakin M.D.   On: 05/22/2014 17:33    EKG: Independently reviewed. Sinus tachycardia at 110 bpm.   Assessment/Plan:   Principal Problem:   CAP (community acquired pneumonia)  Admit to MedSurg bed.  Check sputum cultures, blood cultures, strep/legionella antigens, and HIV serologies.  Treat empirically with Rocephin/azithromycin.  Patient tells me she was recently tested for flu and was negative.  Mucinex/Robitussin-DM ordered.  Active Problems:   Hypertension  Continue Cartia XT and Cozaar.    Allergic rhinitis  Continue Singulair, Claritin-D, and Flonase.    COPD exacerbation / hypoxia  Provide supplemental oxygen as needed.  Solu-Medrol 40 mg IV every 12 hours ordered.  Continue Advair.  Duonebs every 6 hours ordered.    GERD (gastroesophageal reflux disease)  Continue PPI therapy.    Hyperlipidemia  Continue Pravachol.    DVT prophylaxis  Continue Lovenox.  Code Status: Full. Family Communication: Updated at the bedside. Disposition Plan: Home when stable.  Time spent: 1 hour.  RAMA,CHRISTINA [facility] Pager [phone] Cell: [phone]   If 7PM-7AM, please  contact night-coverage [email or web address] Password [facility] 05/22/2014, 6:56 PM

## 2014-05-22 NOTE — ED Notes (Signed)
Pt sent here from [place] [facility] walk in clinic. CXR shoes pneumonia. Patient has been short of breath since Tuesday. Sats 91 on RA.

## 2014-05-22 NOTE — ED Provider Notes (Signed)
CSN: [id number]     Arrival date & time 05/22/14  38 History   First MD Initiated Contact with Patient 05/22/14 1647     Chief Complaint  Patient presents with  . Pneumonia     (Consider location/radiation/quality/duration/timing/severity/associated sxs/prior Treatment) HPI   69 year old female with hx of COPD, asthma, anxiety HTN was sent here from a walk in clinic for further treatment of newly diagnosed PNA.  Patient states she went to [place] days ago to visit friends. While she was down there, she developed URI symptoms including runny nose sneezing coughing. She continues to have persistent cough, has a fever as high as 100.7, chills, body aches, and fatigue. She also endorsed increased shortness of breath with exertion. She was seen at a walk-in clinic today and a chest x-ray demonstrated evidence of pneumonia. She was recommended to come to the ER for IV antibiotic. At this time patient denies having any pleuritic chest pain, nausea vomiting diarrhea, hemoptysis, lightheadedness, dizziness, diaphoresis. No prior history of PE or DVT, no recent surgery, prolonged bed rest, unilateral leg swelling or calf pain or active cancer. She is not on home O2. She was found to be hypoxic with saturation of 91% on room air upon arrival. She denies any recent hospitalization.  Past Medical History  Diagnosis Date  . COPD (chronic obstructive pulmonary disease)   . Asthma   . GERD (gastroesophageal reflux disease)   . Hyperlipidemia   . Hypertension   . Atrial tachycardia   . Diverticulosis   . Insomnia   . Anxiety   . Neuropathy    Past Surgical History  Procedure Laterality Date  . Colonoscopy    . Removed cartliage rib    . Polypectomy    . Cervical polypectomy      INSIDE AND OUTSIDE   Family History  Problem Relation Age of Onset  . Colon polyps Sister 55    PRECANCEROUS POLYPS  . Breast cancer Maternal Grandmother 35  . Emphysema Father   . Asthma Father   . Heart  disease Father   . Heart disease Paternal Grandfather    History  Substance Use Topics  . Smoking status: Former Smoker -- 1.00 packs/day for 20 years    Types: Cigarettes    Quit date: 05/21/1991  . Smokeless tobacco: Never Used  . Alcohol Use: No   OB History    No data available     Review of Systems  All other systems reviewed and are negative.     Allergies  Food; Tequin; Codeine; Erythromycin; Wellbutrin; Penicillins; and Prednisone  Home Medications   Prior to Admission medications   Medication Sig Start Date End Date Taking? Authorizing Provider  acetaminophen (TYLENOL) 500 MG tablet Take 1,000 mg by mouth every 6 (six) hours as needed for moderate pain or headache.   Yes Historical Provider, MD  albuterol (PROVENTIL HFA;VENTOLIN HFA) 108 (90 BASE) MCG/ACT inhaler Inhale 1 puff into the lungs every 6 (six) hours as needed. Shortness of breath 06/04/13  Yes Collene Gobble, MD  aspirin 81 MG tablet Take 81 mg by mouth daily.     Yes Historical Provider, MD  aspirin-acetaminophen-caffeine (EXCEDRIN MIGRAINE) [phone] MG per tablet Take 2 tablets by mouth every 6 (six) hours as needed for headache.   Yes Historical Provider, MD  CARTIA XT 240 MG 24 hr capsule TAKE 1 CAPSULE BY MOUTH EVERY DAY 02/04/14  Yes Candee Furbish, MD  clobetasol cream (TEMOVATE) 0.05 % Apply 1  application topically daily as needed (yeast.).  11/01/13  Yes Historical Provider, MD  FLUoxetine (PROZAC) 20 MG capsule Take 20 mg by mouth Daily. 10/23/10  Yes Historical Provider, MD  fluticasone (FLONASE) 50 MCG/ACT nasal spray Place 1 spray into both nostrils daily. 08/17/13  Yes Collene Gobble, MD  Fluticasone-Salmeterol (ADVAIR DISKUS) 250-50 MCG/DOSE AEPB Inhale 1 puff into the lungs 2 (two) times daily. 01/07/12  Yes Collene Gobble, MD  ibuprofen (ADVIL,MOTRIN) 200 MG tablet Take 400 mg by mouth every 6 (six) hours as needed for headache or moderate pain.   Yes Historical Provider, MD   loratadine-pseudoephedrine (CLARITIN-D 12-HOUR) 5-120 MG per tablet Take 1 tablet by mouth daily.   Yes Historical Provider, MD  LORazepam (ATIVAN) 0.5 MG tablet Take 0.5-1 mg by mouth daily as needed for anxiety. Anxiety   Yes Historical Provider, MD  losartan (COZAAR) 50 MG tablet Take 50 mg by mouth Daily. 10/27/10  Yes Historical Provider, MD  montelukast (SINGULAIR) 10 MG tablet Take 10 mg by mouth daily. 02/21/14  Yes Historical Provider, MD  Multiple Vitamins-Minerals (OCUVITE PO) Take 1 tablet by mouth daily.    Yes Historical Provider, MD  omeprazole (PRILOSEC) 20 MG capsule Take 20 mg by mouth Nightly.     Yes Historical Provider, MD  pravastatin (PRAVACHOL) 40 MG tablet Take 40 mg by mouth at bedtime.  09/04/10  Yes Historical Provider, MD  zaleplon (SONATA) 5 MG capsule Take 5 mg by mouth daily. 08/31/13  Yes Historical Provider, MD   BP 155/61 mmHg  Pulse 107  Temp(Src) 99.3 F (37.4 C) (Oral)  Resp 17  SpO2 91% Physical Exam  Constitutional: She is oriented to person, place, and time. She appears well-developed and well-nourished. No distress.  HENT:  Head: Atraumatic.  Mouth/Throat: Oropharynx is clear and moist.  Eyes: Conjunctivae are normal.  Neck: Neck supple. No JVD present.  Cardiovascular: Normal rate and regular rhythm.   Pulmonary/Chest: Effort normal. No respiratory distress. She has no wheezes.  Abdominal: Soft. There is no tenderness.  Musculoskeletal: She exhibits no edema.  Neurological: She is alert and oriented to person, place, and time.  Skin: No rash noted.  Psychiatric: She has a normal mood and affect.  Nursing note and vitals reviewed.   ED Course  Procedures (including critical care time)  5:17 PM Patient presents complaining of persistent cough and a  diagnosis of pneumonia from a walk-in clinic today. Since we do not have a chest x-ray will repeat a chest x-ray. Patient is allergic to many medication, will give doxycycline as treatment. She was  hypoxic, will check her O2 level.    5:49 PM Repeat chest x-ray shows progressive worsening signs of pneumonia. Given the aggressiveness of her symptoms, plan to have patient admitted for further management. Will initiate IV antibiotic including cefepime and Zithromax. Care discussed with Dr. Ralene Bathe.    6:24 PM Dr. Ralene Bathe consulted hospitalist for admission.  Dr. Rockne Menghini has been consulted, agrees to admit to med surg under her care.    Labs Review Labs Reviewed  CBC - Abnormal; Notable for the following:    Hemoglobin 10.5 (*)    HCT 35.2 (*)    MCH 23.6 (*)    MCHC 29.8 (*)    RDW 17.8 (*)    All other components within normal limits  BASIC METABOLIC PANEL - Abnormal; Notable for the following:    Glucose, Bld 105 (*)    GFR calc non Af Amer 87 (*)  All other components within normal limits  I-STAT TROPOININ, ED    Imaging Review Dg Chest 2 View  05/22/2014   CLINICAL DATA:  Five day history of cough, intermittent fever and dizziness. Former smoker with current history of COPD.  EXAM: CHEST  2 VIEW 1726 hr:  COMPARISON:  Two-view x-ray earlier same date from [place] at [facility]. Two-view chest x-ray 07/27/2013, 04/04/2008, 08/18/2006.  FINDINGS: Since the outside examination earlier same date, further increase in the patchy airspace opacities in the right upper lobe, now with evidence of patchy opacities in the right lower lobe as well. Left lung remains clear. Small right pleural effusion. No pneumothorax. Visualized bony thorax intact.  IMPRESSION: Progressive right lung pneumonia since earlier today, now with involvement of the right upper lobe and right lower lobe.   Electronically Signed   By: Evangeline Dakin M.D.   On: 05/22/2014 17:33     EKG Interpretation None      MDM   Final diagnoses:  Cough  CAP (community acquired pneumonia)    BP 140/60 mmHg  Pulse 110  Temp(Src) 99.3 F (37.4 C) (Oral)  Resp 22  SpO2 93%  I have reviewed nursing notes  and vital signs. I personally reviewed the imaging tests through PACS system  I reviewed available ER/hospitalization records thought the EMR     Domenic Moras, PA-C 05/22/14 [place], MD 05/22/14 2207

## 2014-05-22 NOTE — ED Notes (Signed)
I have just phoned report to Pand[place], RN on [place] and will transport shortly.

## 2014-05-23 LAB — STREP PNEUMONIAE URINARY ANTIGEN: Strep Pneumo Urinary Antigen: NEGATIVE

## 2014-05-23 LAB — HIV ANTIBODY (ROUTINE TESTING W REFLEX): HIV 1&2 Ab, 4th Generation: NONREACTIVE

## 2014-05-23 MED ORDER — LACTASE 3000 UNITS PO TABS
6000.0000 [IU] | ORAL_TABLET | Freq: Three times a day (TID) | ORAL | Status: DC
  Administered 2014-05-24 (×2): 6000 [IU] via ORAL
  Administered 2014-05-26: 3000 [IU] via ORAL
  Filled 2014-05-23 (×12): qty 2

## 2014-05-23 MED ORDER — METHYLPREDNISOLONE SODIUM SUCC 40 MG IJ SOLR
40.0000 mg | Freq: Every day | INTRAMUSCULAR | Status: DC
  Administered 2014-05-24 – 2014-05-25 (×2): 40 mg via INTRAVENOUS
  Filled 2014-05-23 (×2): qty 1

## 2014-05-23 MED ORDER — IPRATROPIUM-ALBUTEROL 0.5-2.5 (3) MG/3ML IN SOLN
3.0000 mL | Freq: Three times a day (TID) | RESPIRATORY_TRACT | Status: DC
  Administered 2014-05-23 – 2014-05-26 (×9): 3 mL via RESPIRATORY_TRACT
  Filled 2014-05-23 (×9): qty 3

## 2014-05-23 NOTE — Progress Notes (Signed)
Progress Note   [name] Molina IHW:[id number] DOB: February 07, 1946 DOA: 05/22/2014 PCP: [name] Schwalbe, MD   Brief Narrative:   [name] Molina is an 69 y.o. female with a PMH of COPD, asthma, anxiety, HTN who was admitted 05/22/14 for further evaluation and treatment of newly diagnosed PNA.  Assessment/Plan:   Principal Problem:  CAP (community acquired pneumonia)  Follow-up sputum cultures, blood cultures, legionella antigen, and HIV serologies. Strep pneumonia antigen negative.  Continue empiric Rocephin/azithromycin.  Patient tells me she was recently tested for flu and was negative.  Continue Mucinex/Robitussin-DM as needed.  Active Problems:  Hypertension  Continue Cartia XT and Cozaar.   Allergic rhinitis  Continue Singulair, Claritin-D, and Flonase.   COPD exacerbation / hypoxia  Provide supplemental oxygen as needed.  Wean steroids.  Continue Advair.  Duonebs every 6 hours ordered.   GERD (gastroesophageal reflux disease)  Continue PPI therapy.   Hyperlipidemia  Continue Pravachol.   DVT prophylaxis  Continue Lovenox.  Code Status: Full. Family Communication: No family currently at the bedside. Disposition Plan: Home when stable.   IV Access:    Peripheral IV   Procedures and diagnostic studies:   Dg Chest 2 View 05/22/2014: Progressive right lung pneumonia since earlier today, now with involvement of the right upper lobe and right lower lobe.     Medical Consultants:    None.  Anti-Infectives:    Rocephin 05/22/14--->  Azithromycin 05/22/14--->  Subjective:    [name] Molina feels a bit better today. Less dyspneic. Still has a bit of a cough. Appetite improved.  Objective:    Filed Vitals:   05/22/14 1749 05/22/14 1958 05/22/14 2130 05/23/14 0522  BP: 140/60 148/69  149/78  Pulse: 110 109  103  Temp:  99.6 F (37.6 C)  97.4 F (36.3 C)  TempSrc:  Oral  Oral  Resp: 22 21  20   Height:  5' (1.524 m)    Weight:  99.1  kg (218 lb 7.6 oz)    SpO2: 93% 94% 95% 93%    Intake/Output Summary (Last 24 hours) at 05/23/14 0806 Last data filed at 05/23/14 0500  Gross per 24 hour  Intake    120 ml  Output      0 ml  Net    120 ml    Exam: Gen:  NAD Cardiovascular:  Tachycardic, No M/R/G Respiratory:  Lungs with bibasilar crackles, no wheeze Gastrointestinal:  Abdomen soft, NT/ND, + BS Extremities:  1+ edema   Data Reviewed:    Labs: Basic Metabolic Panel:  Recent Labs Lab 05/22/14 1626 05/22/14 2030  NA 137  --   K 4.0  --   CL 108  --   CO2 23  --   GLUCOSE 105*  --   BUN 10  --   CREATININE 0.69 0.61  CALCIUM 8.6  --    GFR Estimated Creatinine Clearance: 70.1 mL/min (by C-G formula based on Cr of 0.61).  CBC:  Recent Labs Lab 05/22/14 1626 05/22/14 2030  WBC 8.7 8.2  HGB 10.5* 10.1*  HCT 35.2* 33.8*  MCV 79.1 78.8  PLT 266 278   Microbiology No results found for this or any previous visit (from the past 240 hour(s)).   Medications:   . aspirin  81 mg Oral Daily  . azithromycin  500 mg Intravenous Q24H  . beta carotene w/minerals  1 tablet Oral Daily  . cefTRIAXone (ROCEPHIN)  IV  1 g Intravenous Q24H  . diltiazem  240 mg Oral Daily  . enoxaparin (LOVENOX) injection  40 mg Subcutaneous Q24H  . FLUoxetine  20 mg Oral Daily  . fluticasone  1 spray Each Nare Daily  . guaiFENesin  600 mg Oral BID  . ipratropium-albuterol  3 mL Nebulization Q6H  . loratadine  5 mg Oral Daily   And  . pseudoephedrine  120 mg Oral Daily  . losartan  50 mg Oral Daily  . methylPREDNISolone (SOLU-MEDROL) injection  40 mg Intravenous Q12H  . mometasone-formoterol  2 puff Inhalation BID  . montelukast  10 mg Oral QHS  . pantoprazole  40 mg Oral Daily  . pravastatin  40 mg Oral QHS  . sodium chloride  3 mL Intravenous Q12H   Continuous Infusions:   Time spent: 25 minutes.   LOS: 1 day   [name] Molina  [facility] Pager [phone]. If unable to reach me by pager, please call  my cell phone at [phone].  *Please refer to [email or web address], password [email or web address] to get updated schedule on who will round on this patient, as hospitalists switch teams weekly. If 7PM-7AM, please contact night-coverage at [email or web address], password [email or web address] for any overnight needs.  05/23/2014, 8:07 AM

## 2014-05-23 NOTE — Progress Notes (Signed)
UR complete 

## 2014-05-24 LAB — LEGIONELLA ANTIGEN, URINE

## 2014-05-24 NOTE — Progress Notes (Signed)
[facility] PROGRESS NOTE  [name] Molina PNT:[id number] DOB: 03-31-1946    PCP:   [name], MD   69 yo with COPD/Asthma, HTN hyperlipidemia, atrial tachycardia, anxiety admitted for CAP.  She has been on low dose IV solumedrol, IV Rocephin and IV Zithromax.  She is improving slowly.  She has right upper and right lower lobe.  Legionella antigen and HIV were negatve.  Rewiew of Systems:  Constitutional: Negative for malaise, fever and chills. No significant weight loss or weight gain Eyes: Negative for eye pain, redness and discharge, diplopia, visual changes, or flashes of light. ENMT: Negative for ear pain, hoarseness, nasal congestion, sinus pressure and sore throat. No headaches; tinnitus, drooling, or problem swallowing. Cardiovascular: Negative for chest pain, palpitations, diaphoresis, dyspnea and peripheral edema. ; No orthopnea, PND Respiratory: Negative for cough, hemoptysis, wheezing and stridor. No pleuritic chestpain. Gastrointestinal: Negative for nausea, vomiting, diarrhea, constipation, abdominal pain, melena, blood in stool, hematemesis, jaundice and rectal bleeding.    Genitourinary: Negative for frequency, dysuria, incontinence,flank pain and hematuria; Musculoskeletal: Negative for back pain and neck pain. Negative for swelling and trauma.;  Skin: . Negative for pruritus, rash, abrasions, bruising and skin lesion.; ulcerations Neuro: Negative for headache, lightheadedness and neck stiffness. Negative for weakness, altered level of consciousness , altered mental status, extremity weakness, burning feet, involuntary movement, seizure and syncope.  Psych: negative for anxiety, depression, insomnia, tearfulness, panic attacks, hallucinations, paranoia, suicidal or homicidal ideation    Past Medical History  Diagnosis Date  . COPD (chronic obstructive pulmonary disease)   . Asthma   . GERD (gastroesophageal reflux disease)   . Hyperlipidemia   . Hypertension    . Atrial tachycardia   . Diverticulosis   . Insomnia   . Anxiety   . Neuropathy   . Macular degeneration   . Migraines     Past Surgical History  Procedure Laterality Date  . Colonoscopy    . Removed cartliage rib    . Polypectomy    . Cervical polypectomy      INSIDE AND OUTSIDE    Medications:  HOME MEDS: Prior to Admission medications   Medication Sig Start Date End Date Taking? Authorizing Provider  acetaminophen (TYLENOL) 500 MG tablet Take 1,000 mg by mouth every 6 (six) hours as needed for moderate pain or headache.   Yes Historical Provider, MD  albuterol (PROVENTIL HFA;VENTOLIN HFA) 108 (90 BASE) MCG/ACT inhaler Inhale 1 puff into the lungs every 6 (six) hours as needed. Shortness of breath 06/04/13  Yes Collene Gobble, MD  aspirin 81 MG tablet Take 81 mg by mouth daily.     Yes Historical Provider, MD  aspirin-acetaminophen-caffeine (EXCEDRIN MIGRAINE) [phone] MG per tablet Take 2 tablets by mouth every 6 (six) hours as needed for headache.   Yes Historical Provider, MD  CARTIA XT 240 MG 24 hr capsule TAKE 1 CAPSULE BY MOUTH EVERY DAY 02/04/14  Yes Candee Furbish, MD  clobetasol cream (TEMOVATE) 0.93 % Apply 1 application topically daily as needed (yeast.).  11/01/13  Yes Historical Provider, MD  FLUoxetine (PROZAC) 20 MG capsule Take 20 mg by mouth Daily. 10/23/10  Yes Historical Provider, MD  fluticasone (FLONASE) 50 MCG/ACT nasal spray Place 1 spray into both nostrils daily. 08/17/13  Yes Collene Gobble, MD  Fluticasone-Salmeterol (ADVAIR DISKUS) 250-50 MCG/DOSE AEPB Inhale 1 puff into the lungs 2 (two) times daily. 01/07/12  Yes Collene Gobble, MD  ibuprofen (ADVIL,MOTRIN) 200 MG tablet Take 400 mg by mouth every  6 (six) hours as needed for headache or moderate pain.   Yes Historical Provider, MD  loratadine-pseudoephedrine (CLARITIN-D 12-HOUR) 5-120 MG per tablet Take 1 tablet by mouth daily.   Yes Historical Provider, MD  LORazepam (ATIVAN) 0.5 MG tablet Take 0.5-1 mg by  mouth daily as needed for anxiety. Anxiety   Yes Historical Provider, MD  losartan (COZAAR) 50 MG tablet Take 50 mg by mouth Daily. 10/27/10  Yes Historical Provider, MD  montelukast (SINGULAIR) 10 MG tablet Take 10 mg by mouth daily. 02/21/14  Yes Historical Provider, MD  Multiple Vitamins-Minerals (OCUVITE PO) Take 1 tablet by mouth daily.    Yes Historical Provider, MD  omeprazole (PRILOSEC) 20 MG capsule Take 20 mg by mouth Nightly.     Yes Historical Provider, MD  pravastatin (PRAVACHOL) 40 MG tablet Take 40 mg by mouth at bedtime.  09/04/10  Yes Historical Provider, MD  zaleplon (SONATA) 5 MG capsule Take 5 mg by mouth daily. 08/31/13  Yes Historical Provider, MD     Allergies:  Allergies  Allergen Reactions  . Food Shortness Of Breath    ALLERGY= MUSHROOMS  . Tequin Shortness Of Breath  . Codeine Nausea And Vomiting  . Erythromycin Other (See Comments)    GI UPSET  . Wellbutrin [Bupropion Hcl] Other (See Comments)    SHAKING  . Penicillins Rash  . Prednisone Rash    Social History:   reports that she quit smoking about 23 years ago. Her smoking use included Cigarettes. She has a 20 pack-year smoking history. She has never used smokeless tobacco. She reports that she does not drink alcohol or use illicit drugs.  Family History: Family History  Problem Relation Age of Onset  . Colon polyps Sister 22    PRECANCEROUS POLYPS  . Breast cancer Maternal Grandmother 63  . Emphysema Father   . Asthma Father   . Heart disease Father   . Heart disease Paternal Grandfather      Physical Exam: Filed Vitals:   05/23/14 2028 05/23/14 2143 05/24/14 0617 05/24/14 0815  BP:  140/68 127/65   Pulse:  109 95   Temp:  98.4 F (36.9 C) 98.3 F (36.8 C)   TempSrc:  Oral Oral   Resp:  20 20   Height:      Weight:      SpO2: 98% 95% 96% 98%   Blood pressure 127/65, pulse 95, temperature 98.3 F (36.8 C), temperature source Oral, resp. rate 20, height 5' (1.524 m), weight 99.1 kg (218  lb 7.6 oz), SpO2 98 %.  GEN:  Pleasant patient lying in the stretcher in no acute distress; cooperative with exam. PSYCH:  alert and oriented x4; does not appear anxious or depressed; affect is appropriate. HEENT: Mucous membranes pink and anicteric; PERRLA; EOM intact; no cervical lymphadenopathy nor thyromegaly or carotid bruit; no JVD; There were no stridor. Neck is very supple. Breasts:: Not examined CHEST WALL: No tenderness CHEST: Normal respiration, She has bilateral wheezing and scattered rhonchi. HEART: Regular rate and rhythm.  There are no murmur, rub, or gallops.   BACK: No kyphosis or scoliosis; no CVA tenderness ABDOMEN: soft and non-tender; no masses, no organomegaly, normal abdominal bowel sounds; no pannus; no intertriginous candida. There is no rebound and no distention. Rectal Exam: Not done EXTREMITIES: No bone or joint deformity; age-appropriate arthropathy of the hands and knees; no edema; no ulcerations.  There is no calf tenderness. Genitalia: not examined PULSES: 2+ and symmetric SKIN: Normal hydration no rash  or ulceration CNS: Cranial nerves 2-12 grossly intact no focal lateralizing neurologic deficit.  Speech is fluent; uvula elevated with phonation, facial symmetry and tongue midline. DTR are normal bilaterally, cerebella exam is intact, barbinski is negative and strengths are equaled bilaterally.  No sensory loss.   Labs on Admission:  Basic Metabolic Panel:  Recent Labs Lab 05/22/14 1626 05/22/14 2030  NA 137  --   K 4.0  --   CL 108  --   CO2 23  --   GLUCOSE 105*  --   BUN 10  --   CREATININE 0.69 0.61  CALCIUM 8.6  --    CBC:  Recent Labs Lab 05/22/14 1626 05/22/14 2030  WBC 8.7 8.2  HGB 10.5* 10.1*  HCT 35.2* 33.8*  MCV 79.1 78.8  PLT 266 278   Cardiac Enzymes: No results for input(s): CKTOTAL, CKMB, CKMBINDEX, TROPONINI in the last 168 hours.  CBG: No results for input(s): GLUCAP in the last 168 hours.   Radiological Exams on  Admission: Dg Chest 2 View  05/22/2014   CLINICAL DATA:  Five day history of cough, intermittent fever and dizziness. Former smoker with current history of COPD.  EXAM: CHEST  2 VIEW 1726 hr:  COMPARISON:  Two-view x-ray earlier same date from [place] at [facility]. Two-view chest x-ray 07/27/2013, 04/04/2008, 08/18/2006.  FINDINGS: Since the outside examination earlier same date, further increase in the patchy airspace opacities in the right upper lobe, now with evidence of patchy opacities in the right lower lobe as well. Left lung remains clear. Small right pleural effusion. No pneumothorax. Visualized bony thorax intact.  IMPRESSION: Progressive right lung pneumonia since earlier today, now with involvement of the right upper lobe and right lower lobe.   Electronically Signed   By: Evangeline Dakin M.D.   On: 05/22/2014 17:33    Assessment/Plan Present on Admission:  . CAP (community acquired pneumonia) . COPD exacerbation . Hypertension . Allergic rhinitis . Hyperlipidemia . GERD (gastroesophageal reflux disease)  PLAN: WIll continue with IV antibiotics, IV steroids, and current Tx.  She said she has multiple intolerance to medications but affirmed she can take Doxycycline.  WIll d/c her tomorrow on it.   Other plans as per orders.  Code Status: FULL Haskel Khan, MD. [facility] Pager [phone] 7pm to 7am.  05/24/2014, 11:57 AM

## 2014-05-25 MED ORDER — PREDNISONE 20 MG PO TABS
40.0000 mg | ORAL_TABLET | Freq: Every day | ORAL | Status: DC
  Administered 2014-05-26: 40 mg via ORAL
  Filled 2014-05-25 (×2): qty 2

## 2014-05-25 NOTE — Progress Notes (Signed)
CARE MANAGEMENT NOTE 05/25/2014  Patient:  [name] Molina, [name] Molina   Account Number:  [id number]  Date Initiated:  05/25/2014  Documentation initiated by:  Edwyna Shell  Subjective/Objective Assessment:   69 yo female admitted with CAP from home     Action/Plan:   discharge planning   Anticipated DC Date:  05/25/2014   Anticipated DC Plan:  [place]  CM consult      Choice offered to / List presented to:             Status of service:  Completed, signed off Medicare Important Message given?  YES (If response is "NO", the following Medicare IM given date fields will be blank) Date Medicare IM given:  05/25/2014 Medicare IM given by:  Edwyna Shell Date Additional Medicare IM given:   Additional Medicare IM given by:    Discharge Disposition:    Per UR Regulation:    If discussed at Long Length of Stay Meetings, dates discussed:    Comments:  05/25/14 Edwyna Shell RN BSN CM [phone] Patient lives at home alone, drives self, has PCP, pharmacy. No orders, no needs.

## 2014-05-25 NOTE — Progress Notes (Signed)
[facility] PROGRESS NOTE  [name] Molina IRS:[id number] DOB: [birth year]    PCP:   [name], MD   HPI: 69 yo with COPD/Asthma, HTN hyperlipidemia, atrial tachycardia, anxiety admitted for CAP. She has been on low dose IV solumedrol, IV Rocephin and IV Zithromax. She is improving slowly. She has right upper and right lower lobe. Legionella antigen and HIV were negatve. She is getting much better and is about ready to be discharged.    Rewiew of Systems:  Constitutional: Negative for malaise, fever and chills. No significant weight loss or weight gain Eyes: Negative for eye pain, redness and discharge, diplopia, visual changes, or flashes of light. ENMT: Negative for ear pain, hoarseness, nasal congestion, sinus pressure and sore throat. No headaches; tinnitus, drooling, or problem swallowing. Cardiovascular: Negative for chest pain, palpitations, diaphoresis, dyspnea and peripheral edema. ; No orthopnea, PND Respiratory: Negative for cough, hemoptysis, wheezing and stridor. No pleuritic chestpain. Gastrointestinal: Negative for nausea, vomiting, diarrhea, constipation, abdominal pain, melena, blood in stool, hematemesis, jaundice and rectal bleeding.    Genitourinary: Negative for frequency, dysuria, incontinence,flank pain and hematuria; Musculoskeletal: Negative for back pain and neck pain. Negative for swelling and trauma.;  Skin: . Negative for pruritus, rash, abrasions, bruising and skin lesion.; ulcerations Neuro: Negative for headache, lightheadedness and neck stiffness. Negative for weakness, altered level of consciousness , altered mental status, extremity weakness, burning feet, involuntary movement, seizure and syncope.  Psych: negative for anxiety, depression, insomnia, tearfulness, panic attacks, hallucinations, paranoia, suicidal or homicidal ideation    Past Medical History  Diagnosis Date  . COPD (chronic obstructive pulmonary disease)   . Asthma   . GERD  (gastroesophageal reflux disease)   . Hyperlipidemia   . Hypertension   . Atrial tachycardia   . Diverticulosis   . Insomnia   . Anxiety   . Neuropathy   . Macular degeneration   . Migraines     Past Surgical History  Procedure Laterality Date  . Colonoscopy    . Removed cartliage rib    . Polypectomy    . Cervical polypectomy      INSIDE AND OUTSIDE    Medications:  HOME MEDS: Prior to Admission medications   Medication Sig Start Date End Date Taking? Authorizing Provider  acetaminophen (TYLENOL) 500 MG tablet Take 1,000 mg by mouth every 6 (six) hours as needed for moderate pain or headache.   Yes Historical Provider, MD  albuterol (PROVENTIL HFA;VENTOLIN HFA) 108 (90 BASE) MCG/ACT inhaler Inhale 1 puff into the lungs every 6 (six) hours as needed. Shortness of breath 06/04/13  Yes Collene Gobble, MD  aspirin 81 MG tablet Take 81 mg by mouth daily.     Yes Historical Provider, MD  aspirin-acetaminophen-caffeine (EXCEDRIN MIGRAINE) [phone] MG per tablet Take 2 tablets by mouth every 6 (six) hours as needed for headache.   Yes Historical Provider, MD  CARTIA XT 240 MG 24 hr capsule TAKE 1 CAPSULE BY MOUTH EVERY DAY 02/04/14  Yes Candee Furbish, MD  clobetasol cream (TEMOVATE) 9.93 % Apply 1 application topically daily as needed (yeast.).  11/01/13  Yes Historical Provider, MD  FLUoxetine (PROZAC) 20 MG capsule Take 20 mg by mouth Daily. 10/23/10  Yes Historical Provider, MD  fluticasone (FLONASE) 50 MCG/ACT nasal spray Place 1 spray into both nostrils daily. 08/17/13  Yes Collene Gobble, MD  Fluticasone-Salmeterol (ADVAIR DISKUS) 250-50 MCG/DOSE AEPB Inhale 1 puff into the lungs 2 (two) times daily. 01/07/12  Yes Collene Gobble, MD  ibuprofen (ADVIL,MOTRIN) 200 MG tablet Take 400 mg by mouth every 6 (six) hours as needed for headache or moderate pain.   Yes Historical Provider, MD  loratadine-pseudoephedrine (CLARITIN-D 12-HOUR) 5-120 MG per tablet Take 1 tablet by mouth daily.   Yes  Historical Provider, MD  LORazepam (ATIVAN) 0.5 MG tablet Take 0.5-1 mg by mouth daily as needed for anxiety. Anxiety   Yes Historical Provider, MD  losartan (COZAAR) 50 MG tablet Take 50 mg by mouth Daily. 10/27/10  Yes Historical Provider, MD  montelukast (SINGULAIR) 10 MG tablet Take 10 mg by mouth daily. 02/21/14  Yes Historical Provider, MD  Multiple Vitamins-Minerals (OCUVITE PO) Take 1 tablet by mouth daily.    Yes Historical Provider, MD  omeprazole (PRILOSEC) 20 MG capsule Take 20 mg by mouth Nightly.     Yes Historical Provider, MD  pravastatin (PRAVACHOL) 40 MG tablet Take 40 mg by mouth at bedtime.  09/04/10  Yes Historical Provider, MD  zaleplon (SONATA) 5 MG capsule Take 5 mg by mouth daily. 08/31/13  Yes Historical Provider, MD     Allergies:  Allergies  Allergen Reactions  . Food Shortness Of Breath    ALLERGY= MUSHROOMS  . Tequin Shortness Of Breath  . Codeine Nausea And Vomiting  . Erythromycin Other (See Comments)    GI UPSET  . Wellbutrin [Bupropion Hcl] Other (See Comments)    SHAKING  . Penicillins Rash  . Prednisone Rash    Social History:   reports that she quit smoking about 23 years ago. Her smoking use included Cigarettes. She has a 20 pack-year smoking history. She has never used smokeless tobacco. She reports that she does not drink alcohol or use illicit drugs.  Family History: Family History  Problem Relation Age of Onset  . Colon polyps Sister 55    PRECANCEROUS POLYPS  . Breast cancer Maternal Grandmother 49  . Emphysema Father   . Asthma Father   . Heart disease Father   . Heart disease Paternal Grandfather      Physical Exam: Filed Vitals:   05/24/14 2035 05/24/14 2107 05/25/14 0628 05/25/14 1427  BP:  137/60 128/58 151/77  Pulse:  110 86 111  Temp:  98.1 F (36.7 C) 97.9 F (36.6 C) 97.8 F (36.6 C)  TempSrc:  Oral Oral Oral  Resp:  20 18 18   Height:      Weight:      SpO2: 97% 99% 96% 94%   Blood pressure 151/77, pulse 111,  temperature 97.8 F (36.6 C), temperature source Oral, resp. rate 18, height 5' (1.524 m), weight 99.1 kg (218 lb 7.6 oz), SpO2 94 %.  GEN:  Pleasant  patient lying in the stretcher in no acute distress; cooperative with exam. PSYCH:  alert and oriented x4; does not appear anxious or depressed; affect is appropriate. HEENT: Mucous membranes pink and anicteric; PERRLA; EOM intact; no cervical lymphadenopathy nor thyromegaly or carotid bruit; no JVD; There were no stridor. Neck is very supple. Breasts:: Not examined CHEST WALL: No tenderness CHEST: Normal respiration, Rightsided crackles. HEART: Regular rate and rhythm.  There are no murmur, rub, or gallops.   BACK: No kyphosis or scoliosis; no CVA tenderness ABDOMEN: soft and non-tender; no masses, no organomegaly, normal abdominal bowel sounds; no pannus; no intertriginous candida. There is no rebound and no distention. Rectal Exam: Not done EXTREMITIES: No bone or joint deformity; age-appropriate arthropathy of the hands and knees; no edema; no ulcerations.  There is no calf tenderness. Genitalia: not  examined PULSES: 2+ and symmetric SKIN: Normal hydration no rash or ulceration CNS: Cranial nerves 2-12 grossly intact no focal lateralizing neurologic deficit.  Speech is fluent; uvula elevated with phonation, facial symmetry and tongue midline. DTR are normal bilaterally, cerebella exam is intact, barbinski is negative and strengths are equaled bilaterally.  No sensory loss.   Labs on Admission:  Basic Metabolic Panel:  Recent Labs Lab 05/22/14 1626 05/22/14 2030  NA 137  --   K 4.0  --   CL 108  --   CO2 23  --   GLUCOSE 105*  --   BUN 10  --   CREATININE 0.69 0.61  CALCIUM 8.6  --   CBC:  Recent Labs Lab 05/22/14 1626 05/22/14 2030  WBC 8.7 8.2  HGB 10.5* 10.1*  HCT 35.2* 33.8*  MCV 79.1 78.8  PLT 266 278      Assessment/Plan Present on Admission:  . CAP (community acquired pneumonia) . COPD exacerbation .  Hypertension . Allergic rhinitis . Hyperlipidemia . GERD (gastroesophageal reflux disease)   PLAN:  She is still having crackles on the right side, and since she had progression of her PNA so rapidly, will repeat a CXR tomorrow, continue IV antibiotics another day.  D/C IV Steroid, put on Prednisone today and consider d/c tomorrow. She has multiple allergy, but she could take Doxy.    Other plans as per orders.  Code Status: FULL Haskel Khan, MD. [facility] Pager [phone] 7pm to 7am.  05/25/2014, 4:21 PM

## 2014-05-26 ENCOUNTER — Inpatient Hospital Stay (HOSPITAL_COMMUNITY): Payer: Medicare Other

## 2014-05-26 MED ORDER — DOXYCYCLINE MONOHYDRATE 100 MG PO CAPS: 100.0000 mg | ORAL_CAPSULE | Freq: Two times a day (BID) | ORAL | Status: DC

## 2014-05-26 MED ORDER — AZITHROMYCIN 250 MG PO TABS: ORAL_TABLET | ORAL | Status: DC

## 2014-05-26 NOTE — Discharge Summary (Signed)
Physician Discharge Summary  [name] Molina WYS:[id number] DOB: 1946-04-20 DOA: 05/22/2014  PCP: [name], MD  Admit date: 05/22/2014 Discharge date: 05/26/2014  Recommendations for Outpatient Follow-up:  1. Continue azithromycin 250 mg daily for 6 days. Continue doxycycline twice daily for 6 days on discharge. This would complete total of 10 day treatment with antibiotics for pneumonia. 2. Please have primary care physician repeat chest x-ray in about 2 weeks from this discharge to make sure opacities are resolving on chest x-ray. As noted on radiology interpretation of chest x-ray if this focal opacity persists beyond 4-6 weeks then CT scan is recommended for evaluation of underlying neoplastic process.  Discharge Diagnoses:  Principal Problem:   CAP (community acquired pneumonia) Active Problems:   Hypertension   Allergic rhinitis   COPD exacerbation   GERD (gastroesophageal reflux disease)   Hyperlipidemia    Discharge Condition: stable   Diet recommendation: as tolerated   History of present illness:  69 y.o. female with a PMH of COPD, asthma, anxiety, HTN who was sent here from a walk in clinic for further evaluation and treatment of newly diagnosed PNA. She has multiple antibiotic allergies which limit outpatient treatment. Patient states she went to [place] days ago to visit friends. While she was down there, she developed URI symptoms with a 5 day history of symptoms which began with myalgias and chills, progressive dyspnea, cough productive of green sputum, rhinorrhea with clear/bloody/green drainage. Patient continues to complain of cough and had associated fevers and chills, body aches and fatigue. She also had shortness of breath with exertion. On admission, patient was found to have evidence of pneumonia. She was started on IV antibiotics, Rocephin and azithromycin and both of those she tolerated well with no allergic reaction.  Principal Problem:  CAP  (community acquired pneumonia)  Blood cultures to date are negative, legionella, HIV are negative. Strep pneumonia is negative.  Continue empiric Rocephin/azithromycin while in hospital. Patient will continue azithromycin for 6 days on discharge and doxycycline for 6 days on discharge.  May continue antitussives on discharge as needed.  Repeat chest x-ray prior to discharge shows some improvement in pneumonia. There is a persistent focal patch in the right lower lobe and repeat chest x-ray is required in 4-6 weeks. I have recommended the patient to have repeat chest x-ray in about 2 weeks just to monitor for resolution. As noted on radiology interpretation of chest x-ray if this focal opacity persists beyond 4-6 weeks then CT scan is recommended for evaluation of underlying neoplastic process.  Active Problems:  Hypertension  Continue Cartia XT and Cozaar.   Allergic rhinitis  Continue Singulair, Claritin-D, and Flonase.   COPD exacerbation / hypoxia  Patient does not need steroids on discharge.  Continue home inhaler regimen, this includes Advair.  Patient has pulmonary doctor she follows with.   GERD (gastroesophageal reflux disease)  Continue PPI therapy.   Hyperlipidemia  Continue Pravachol.   DVT prophylaxis  Continue Lovenox while patient is in hospital.  Code Status: Full. Family Communication: No family currently at the bedside.    IV Access:    Peripheral IV   Procedures and diagnostic studies:   Dg Chest 2 View 05/22/2014: Progressive right lung pneumonia since earlier today, now with involvement of the right upper lobe and right lower lobe.   Dg Chest 2 View 05/26/2014: Improving patchy airspace opacity throughout the right lung compared to review study. Persistent focal patch of increased density in the right lower lobe. Recommendation to  repeat chest x-ray in 4-6 weeks to confirm resolution. If this abnormality persists then CT scan of  the chest without contrast would likely be warranted to exclude an underlying neoplastic process.  Medical Consultants:    None.  Anti-Infectives:    Rocephin 05/22/14---> 05/26/2013   Azithromycin 05/22/14---> patient will continue for 6 more days on discharge  Doxycycline, patient will continue for 6 more days on discharge   Signed:  [name], MD  [facility] 05/26/2014, 10:58 AM  Pager #: [phone]   Discharge Exam: Filed Vitals:   05/26/14 0600  BP: 154/79  Pulse: 109  Temp: 97.6 F (36.4 C)  Resp: 22   Filed Vitals:   05/25/14 1939 05/25/14 2137 05/26/14 0600 05/26/14 1028  BP:  153/73 154/79   Pulse:  101 109   Temp:  97.9 F (36.6 C) 97.6 F (36.4 C)   TempSrc:  Oral Oral   Resp:  16 22   Height:      Weight:      SpO2: 94% 93% 92% 98%    General: Pt is alert, follows commands appropriately, not in acute distress Cardiovascular: Regular rate and rhythm, S1/S2 +, no murmurs Respiratory: Mild wheezing in upper lung lobes Abdominal: Soft, non tender, non distended, bowel sounds +, no guarding Extremities: no edema, no cyanosis, pulses palpable bilaterally DP and PT Neuro: Grossly nonfocal  Discharge Instructions  Discharge Instructions    Call MD for:  difficulty breathing, headache or visual disturbances    Complete by:  As directed      Call MD for:  persistant dizziness or light-headedness    Complete by:  As directed      Call MD for:  persistant nausea and vomiting    Complete by:  As directed      Call MD for:  severe uncontrolled Molina    Complete by:  As directed      Diet - low sodium heart healthy    Complete by:  As directed      Discharge instructions    Complete by:  As directed   1. Continue azithromycin 250 mg daily for 6 days. Continue doxycycline twice daily for 6 days on discharge. This would complete total of 10 day treatment with antibiotics for pneumonia. 2. Please have primary care physician repeat chest  x-ray in about 2 weeks from this discharge to make sure opacities are resolving on chest x-ray.     Increase activity slowly    Complete by:  As directed             Medication List    TAKE these medications        acetaminophen 500 MG tablet  Commonly known as:  TYLENOL  Take 1,000 mg by mouth every 6 (six) hours as needed for moderate Molina or headache.     albuterol 108 (90 BASE) MCG/ACT inhaler  Commonly known as:  PROVENTIL HFA;VENTOLIN HFA  Inhale 1 puff into the lungs every 6 (six) hours as needed. Shortness of breath     aspirin 81 MG tablet  Take 81 mg by mouth daily.     aspirin-acetaminophen-caffeine 373-428-76 MG per tablet  Commonly known as:  EXCEDRIN MIGRAINE  Take 2 tablets by mouth every 6 (six) hours as needed for headache.     azithromycin 250 MG tablet  Commonly known as:  ZITHROMAX  Please take 250 mg daily for 6 days     CARTIA XT 240 MG 24 hr capsule  Generic drug:  diltiazem  TAKE 1 CAPSULE BY MOUTH EVERY DAY     clobetasol cream 0.05 %  Commonly known as:  TEMOVATE  Apply 1 application topically daily as needed (yeast.).     doxycycline 100 MG capsule  Commonly known as:  MONODOX  Take 1 capsule (100 mg total) by mouth 2 (two) times daily.     FLUoxetine 20 MG capsule  Commonly known as:  PROZAC  Take 20 mg by mouth Daily.     fluticasone 50 MCG/ACT nasal spray  Commonly known as:  FLONASE  Place 1 spray into both nostrils daily.     Fluticasone-Salmeterol 250-50 MCG/DOSE Aepb  Commonly known as:  ADVAIR DISKUS  Inhale 1 puff into the lungs 2 (two) times daily.     ibuprofen 200 MG tablet  Commonly known as:  ADVIL,MOTRIN  Take 400 mg by mouth every 6 (six) hours as needed for headache or moderate Molina.     loratadine-pseudoephedrine 5-120 MG per tablet  Commonly known as:  CLARITIN-D 12-hour  Take 1 tablet by mouth daily.     LORazepam 0.5 MG tablet  Commonly known as:  ATIVAN  Take 0.5-1 mg by mouth daily as needed for  anxiety. Anxiety     losartan 50 MG tablet  Commonly known as:  COZAAR  Take 50 mg by mouth Daily.     montelukast 10 MG tablet  Commonly known as:  SINGULAIR  Take 10 mg by mouth daily.     OCUVITE PO  Take 1 tablet by mouth daily.     omeprazole 20 MG capsule  Commonly known as:  PRILOSEC  Take 20 mg by mouth Nightly.     pravastatin 40 MG tablet  Commonly known as:  PRAVACHOL  Take 40 mg by mouth at bedtime.     zaleplon 5 MG capsule  Commonly known as:  SONATA  Take 5 mg by mouth daily.           Follow-up Information    Follow up with [name], MD. Schedule an appointment as soon as possible for a visit in 2 weeks.   Specialty:  Family Medicine   Why:  Follow up appt after recent hospitalization; repeat CXR   Contact information:   [place], [place] [address] [phone]        The results of significant diagnostics from this hospitalization (including imaging, microbiology, ancillary and laboratory) are listed below for reference.    Significant Diagnostic Studies: Dg Chest 2 View  05/22/2014   CLINICAL DATA:  Five day history of cough, intermittent fever and dizziness. Former smoker with current history of COPD.  EXAM: CHEST  2 VIEW 1726 hr:  COMPARISON:  Two-view x-ray earlier same date from [place] at [facility]. Two-view chest x-ray 07/27/2013, 04/04/2008, 08/18/2006.  FINDINGS: Since the outside examination earlier same date, further increase in the patchy airspace opacities in the right upper lobe, now with evidence of patchy opacities in the right lower lobe as well. Left lung remains clear. Small right pleural effusion. No pneumothorax. Visualized bony thorax intact.  IMPRESSION: Progressive right lung pneumonia since earlier today, now with involvement of the right upper lobe and right lower lobe.   Electronically Signed   By: Evangeline Dakin M.D.   On: 05/22/2014 17:33   Dg Chest Port 1 View  05/26/2014    CLINICAL DATA:  69 year old female with recent diagnosis of pneumonia and persistent cough and congestion.  EXAM: PORTABLE CHEST - 1 VIEW  COMPARISON:  Prior chest x-ray 05/22/2014 ; 04/04/2008  FINDINGS: Stable cardiac and mediastinal contours. Double density overlying the right heart suggests left atrial enlargement. Similar findings can be seen dating back to November of 2009 and therefore an underlying mass is considered less likely. Atherosclerotic calcifications are present within the transverse aorta. Significant interval improvement in patchy airspace disease throughout the right lung. However, there is some residual focal region of opacity in the right lower lobe. Decreasing fluid with an the minor fissure. Persistent basilar atelectasis. No pneumothorax, pulmonary edema or effusion. No acute osseous abnormality. Degenerative osteoarthritis of the left glenohumeral joint.  IMPRESSION: 1. Improving patchy airspace opacity throughout the right lung compared to 05/22/2014. There is a persistent focal patch of increased density in the right lower lobe. Recommend an additional followup chest x-ray in 4-6 weeks to confirm resolution. If the abnormality persists, CT scan of the chest without contrast would likely be warranted to exclude an underlying neoplastic process. 2. Double density overlying the right heart has been seen dating back to 2009 and likely reflects left atrial enlargement. 3. Aortic atherosclerosis 4. Bibasilar atelectasis and small fluid versus thickening in the minor fissure.   Electronically Signed   By: Jacqulynn Cadet M.D.   On: 05/26/2014 09:13    Microbiology: Recent Results (from the past 240 hour(s))  Culture, blood (routine x 2) Call MD if unable to obtain prior to antibiotics being given     Status: None (Preliminary result)   Collection Time: 05/22/14  7:59 PM  Result Value Ref Range Status   Specimen Description BLOOD RIGHT HAND  Final   Special Requests BOTTLES DRAWN  AEROBIC AND ANAEROBIC [place]  Final   Culture   Final           BLOOD CULTURE RECEIVED NO GROWTH TO DATE CULTURE WILL BE HELD FOR 5 DAYS BEFORE ISSUING A FINAL NEGATIVE REPORT Performed at [facility]    Report Status PENDING  Incomplete  Culture, blood (routine x 2) Call MD if unable to obtain prior to antibiotics being given     Status: None (Preliminary result)   Collection Time: 05/22/14  8:04 PM  Result Value Ref Range Status   Specimen Description BLOOD LEFT ARM  Final   Special Requests BOTTLES DRAWN AEROBIC AND ANAEROBIC [place]  Final   Culture   Final           BLOOD CULTURE RECEIVED NO GROWTH TO DATE CULTURE WILL BE HELD FOR 5 DAYS BEFORE ISSUING A FINAL NEGATIVE REPORT Note: Culture results may be compromised due to an excessive volume of blood received in culture bottles. Performed at [facility]    Report Status PENDING  Incomplete     Labs: Basic Metabolic Panel:  Recent Labs Lab 05/22/14 1626 05/22/14 2030  NA 137  --   K 4.0  --   CL 108  --   CO2 23  --   GLUCOSE 105*  --   BUN 10  --   CREATININE 0.69 0.61  CALCIUM 8.6  --    Liver Function Tests: No results for input(s): AST, ALT, ALKPHOS, BILITOT, PROT, ALBUMIN in the last 168 hours. No results for input(s): LIPASE, AMYLASE in the last 168 hours. No results for input(s): AMMONIA in the last 168 hours. CBC:  Recent Labs Lab 05/22/14 1626 05/22/14 2030  WBC 8.7 8.2  HGB 10.5* 10.1*  HCT 35.2* 33.8*  MCV 79.1 78.8  PLT 266 278   Cardiac Enzymes: No results for input(s): CKTOTAL, CKMB, CKMBINDEX, TROPONINI in the last 168 hours. BNP: BNP (last 3 results) No results for input(s): PROBNP in the last 8760 hours. CBG: No results for input(s): GLUCAP in the last 168 hours.  Time coordinating discharge: Over 30 minutes

## 2014-05-26 NOTE — Discharge Instructions (Signed)

## 2014-05-29 LAB — CULTURE, BLOOD (ROUTINE X 2)
Culture: NO GROWTH
Culture: NO GROWTH

## 2014-06-12 ENCOUNTER — Other Ambulatory Visit: Payer: Self-pay | Admitting: Cardiology

## 2014-06-14 ENCOUNTER — Encounter (INDEPENDENT_AMBULATORY_CARE_PROVIDER_SITE_OTHER): Payer: Medicare Other | Admitting: Ophthalmology

## 2014-06-14 DIAGNOSIS — I10 Essential (primary) hypertension: Secondary | ICD-10-CM | POA: Diagnosis not present

## 2014-06-14 DIAGNOSIS — H35033 Hypertensive retinopathy, bilateral: Secondary | ICD-10-CM

## 2014-06-14 DIAGNOSIS — H43813 Vitreous degeneration, bilateral: Secondary | ICD-10-CM | POA: Diagnosis not present

## 2014-06-14 DIAGNOSIS — H3531 Nonexudative age-related macular degeneration: Secondary | ICD-10-CM

## 2014-06-14 DIAGNOSIS — H3532 Exudative age-related macular degeneration: Secondary | ICD-10-CM

## 2014-06-21 ENCOUNTER — Ambulatory Visit
Admission: RE | Admit: 2014-06-21 | Discharge: 2014-06-21 | Disposition: A | Discharge status: Home / Self Care | Payer: Medicare Other | Source: Ambulatory Visit | Attending: Family Medicine | Admitting: Family Medicine

## 2014-06-21 ENCOUNTER — Other Ambulatory Visit: Payer: Self-pay | Admitting: Family Medicine

## 2014-06-21 DIAGNOSIS — R079 Chest pain, unspecified: Secondary | ICD-10-CM | POA: Diagnosis not present

## 2014-06-21 DIAGNOSIS — R0602 Shortness of breath: Secondary | ICD-10-CM | POA: Diagnosis not present

## 2014-06-21 DIAGNOSIS — J189 Pneumonia, unspecified organism: Secondary | ICD-10-CM

## 2014-06-21 DIAGNOSIS — I10 Essential (primary) hypertension: Secondary | ICD-10-CM | POA: Diagnosis not present

## 2014-06-26 DIAGNOSIS — J449 Chronic obstructive pulmonary disease, unspecified: Secondary | ICD-10-CM | POA: Diagnosis not present

## 2014-06-26 DIAGNOSIS — J189 Pneumonia, unspecified organism: Secondary | ICD-10-CM | POA: Diagnosis not present

## 2014-07-01 DIAGNOSIS — M25562 Pain in left knee: Secondary | ICD-10-CM | POA: Diagnosis not present

## 2014-07-05 ENCOUNTER — Ambulatory Visit
Admission: RE | Admit: 2014-07-05 | Discharge: 2014-07-05 | Disposition: A | Discharge status: Home / Self Care | Payer: Medicare Other | Source: Ambulatory Visit | Attending: Family Medicine | Admitting: Family Medicine

## 2014-07-05 ENCOUNTER — Other Ambulatory Visit: Payer: Self-pay | Admitting: Family Medicine

## 2014-07-05 DIAGNOSIS — M25562 Pain in left knee: Secondary | ICD-10-CM

## 2014-07-05 DIAGNOSIS — M1712 Unilateral primary osteoarthritis, left knee: Secondary | ICD-10-CM | POA: Diagnosis not present

## 2014-07-06 DIAGNOSIS — M25562 Pain in left knee: Secondary | ICD-10-CM | POA: Diagnosis not present

## 2014-07-06 DIAGNOSIS — R262 Difficulty in walking, not elsewhere classified: Secondary | ICD-10-CM | POA: Diagnosis not present

## 2014-07-11 DIAGNOSIS — R262 Difficulty in walking, not elsewhere classified: Secondary | ICD-10-CM | POA: Diagnosis not present

## 2014-07-11 DIAGNOSIS — M25562 Pain in left knee: Secondary | ICD-10-CM | POA: Diagnosis not present

## 2014-07-13 ENCOUNTER — Other Ambulatory Visit: Payer: Self-pay | Admitting: *Deleted

## 2014-07-13 MED ORDER — DILTIAZEM HCL ER COATED BEADS 240 MG PO CP24: 240.0000 mg | ORAL_CAPSULE | Freq: Every day | ORAL | Status: DC

## 2014-07-14 DIAGNOSIS — R262 Difficulty in walking, not elsewhere classified: Secondary | ICD-10-CM | POA: Diagnosis not present

## 2014-07-14 DIAGNOSIS — M25562 Pain in left knee: Secondary | ICD-10-CM | POA: Diagnosis not present

## 2014-07-18 DIAGNOSIS — R262 Difficulty in walking, not elsewhere classified: Secondary | ICD-10-CM | POA: Diagnosis not present

## 2014-07-18 DIAGNOSIS — M25562 Pain in left knee: Secondary | ICD-10-CM | POA: Diagnosis not present

## 2014-07-21 DIAGNOSIS — M25562 Pain in left knee: Secondary | ICD-10-CM | POA: Diagnosis not present

## 2014-07-21 DIAGNOSIS — R262 Difficulty in walking, not elsewhere classified: Secondary | ICD-10-CM | POA: Diagnosis not present

## 2014-07-25 DIAGNOSIS — M25562 Pain in left knee: Secondary | ICD-10-CM | POA: Diagnosis not present

## 2014-07-25 DIAGNOSIS — R262 Difficulty in walking, not elsewhere classified: Secondary | ICD-10-CM | POA: Diagnosis not present

## 2014-07-26 DIAGNOSIS — H52203 Unspecified astigmatism, bilateral: Secondary | ICD-10-CM | POA: Diagnosis not present

## 2014-07-26 DIAGNOSIS — H25043 Posterior subcapsular polar age-related cataract, bilateral: Secondary | ICD-10-CM | POA: Diagnosis not present

## 2014-07-26 DIAGNOSIS — H5213 Myopia, bilateral: Secondary | ICD-10-CM | POA: Diagnosis not present

## 2014-07-26 DIAGNOSIS — H2513 Age-related nuclear cataract, bilateral: Secondary | ICD-10-CM | POA: Diagnosis not present

## 2014-07-26 DIAGNOSIS — H524 Presbyopia: Secondary | ICD-10-CM | POA: Diagnosis not present

## 2014-07-28 DIAGNOSIS — M25562 Pain in left knee: Secondary | ICD-10-CM | POA: Diagnosis not present

## 2014-07-28 DIAGNOSIS — R262 Difficulty in walking, not elsewhere classified: Secondary | ICD-10-CM | POA: Diagnosis not present

## 2014-08-09 ENCOUNTER — Ambulatory Visit (INDEPENDENT_AMBULATORY_CARE_PROVIDER_SITE_OTHER): Payer: Medicare Other | Admitting: Cardiology

## 2014-08-09 ENCOUNTER — Other Ambulatory Visit: Payer: Self-pay | Admitting: *Deleted

## 2014-08-09 ENCOUNTER — Encounter: Payer: Self-pay | Admitting: Cardiology

## 2014-08-09 VITALS — BP 124/82 | HR 94 | Ht 60.0 in | Wt 212.0 lb

## 2014-08-09 DIAGNOSIS — I471 Supraventricular tachycardia: Secondary | ICD-10-CM

## 2014-08-09 DIAGNOSIS — I4719 Other supraventricular tachycardia: Secondary | ICD-10-CM | POA: Insufficient documentation

## 2014-08-09 MED ORDER — DILTIAZEM HCL ER COATED BEADS 240 MG PO CP24: 240.0000 mg | ORAL_CAPSULE | Freq: Every day | ORAL | Status: DC

## 2014-08-09 NOTE — Progress Notes (Signed)
[facility]. [address], [address] [place], [place]  [address] Phone: [phone] Fax:  [phone]  Date:  08/09/2014   ID:  [name] Molina, [name] Molina 06-Dec-1945, MRN [id number]  PCP:  [name], MD   History of Present Illness: [name] Molina is a 69 y.o. female here for the follow up of palpitations, paroxysmal atrial tachycardia. 05/2014 had PNA hospitalization. Took aunt to MD office. Twisted knee, MCL, PT.    I had seen her in 2011. At that time, nuclear stress test had been performed which was low risk, no ischemia, ejection fraction on echocardiogram was normal. She had been on diltiazem for several years. Or palpitations/fluttering sensation have been paroxysmal over the last several years. While driving felt palpitations, scared her. Self terminated. Occasional short burst. One time, she was woken with palpitations in the middle night. Self terminating. Recent platelet donation was aborted because of anemia on fingerstick. Her CBC and TSH done by Dr. Dema Severin were normal.    has been doing well since increasing the diltiazem. No recent complaints.   Wt Readings from Last 3 Encounters:  08/09/14 212 lb (96.163 kg)  05/22/14 218 lb 7.6 oz (99.1 kg)  04/19/14 222 lb 3.2 oz (100.789 kg)     Past Medical History  Diagnosis Date  . COPD (chronic obstructive pulmonary disease)   . Asthma   . GERD (gastroesophageal reflux disease)   . Hyperlipidemia   . Hypertension   . Atrial tachycardia   . Diverticulosis   . Insomnia   . Anxiety   . Neuropathy   . Macular degeneration   . Migraines     Past Surgical History  Procedure Laterality Date  . Colonoscopy    . Removed cartliage rib    . Polypectomy    . Cervical polypectomy      INSIDE AND OUTSIDE    Current Outpatient Prescriptions  Medication Sig Dispense Refill  . acetaminophen (TYLENOL) 500 MG tablet Take 1,000 mg by mouth every 6 (six) hours as needed for moderate pain or headache.    . albuterol (PROVENTIL  HFA;VENTOLIN HFA) 108 (90 BASE) MCG/ACT inhaler Inhale 1 puff into the lungs every 6 (six) hours as needed. Shortness of breath 1 Inhaler 11  . aspirin 81 MG tablet Take 81 mg by mouth daily.      Marland Kitchen aspirin-acetaminophen-caffeine (EXCEDRIN MIGRAINE) 250-250-65 MG per tablet Take 2 tablets by mouth every 6 (six) hours as needed for headache.    . clobetasol cream (TEMOVATE) 1.94 % Apply 1 application topically daily as needed (yeast.).     Marland Kitchen diltiazem (CARTIA XT) 240 MG 24 hr capsule Take 1 capsule (240 mg total) by mouth daily. 30 capsule 0  . FLUoxetine (PROZAC) 20 MG capsule Take 20 mg by mouth Daily.    . fluticasone (FLONASE) 50 MCG/ACT nasal spray Place 1 spray into both nostrils daily. 16 g 11  . Fluticasone-Salmeterol (ADVAIR DISKUS) 250-50 MCG/DOSE AEPB Inhale 1 puff into the lungs 2 (two) times daily. 60 each 11  . ibuprofen (ADVIL,MOTRIN) 200 MG tablet Take 400 mg by mouth every 6 (six) hours as needed for headache or moderate pain.    Marland Kitchen loratadine-pseudoephedrine (CLARITIN-D 12-HOUR) 5-120 MG per tablet Take 1 tablet by mouth daily.    Marland Kitchen LORazepam (ATIVAN) 0.5 MG tablet Take 0.5-1 mg by mouth daily as needed for anxiety. Anxiety    . losartan (COZAAR) 50 MG tablet Take 50 mg by mouth Daily.    Marland Kitchen  montelukast (SINGULAIR) 10 MG tablet Take 10 mg by mouth daily.    . Multiple Vitamins-Minerals (OCUVITE PO) Take 1 tablet by mouth daily.     Marland Kitchen omeprazole (PRILOSEC) 20 MG capsule Take 20 mg by mouth Nightly.      . pravastatin (PRAVACHOL) 40 MG tablet Take 40 mg by mouth at bedtime.     . zaleplon (SONATA) 5 MG capsule Take 5 mg by mouth daily.     No current facility-administered medications for this visit.    Allergies:    Allergies  Allergen Reactions  . Food Shortness Of Breath    ALLERGY= MUSHROOMS  . Tequin Shortness Of Breath  . Codeine Nausea And Vomiting  . Erythromycin Other (See Comments)    GI UPSET  . Wellbutrin [Bupropion Hcl] Other (See Comments)    SHAKING  .  Penicillins Rash  . Prednisone Rash    Social History:  The patient  reports that she quit smoking about 23 years ago. Her smoking use included Cigarettes. She has a 20 pack-year smoking history. She has never used smokeless tobacco. She reports that she does not drink alcohol or use illicit drugs.   Family History  Problem Relation Age of Onset  . Colon polyps Sister 80    PRECANCEROUS POLYPS  . Breast cancer Maternal Grandmother 2  . Emphysema Father   . Asthma Father   . Heart disease Father   . Heart disease Paternal Grandfather     ROS:  Please see the history of present illness.   No syncope, no bleeding, no orthopnea, no PND, fleeting, atypical chest pain.    All other systems reviewed and negative.   PHYSICAL EXAM: VS:  BP 124/82 mmHg  Pulse 94  Ht 5' (1.524 m)  Wt 212 lb (96.163 kg)  BMI 41.40 kg/m2 Well nourished, well developed, in no acute distress HEENT: normal, [place]/AT, EOMI Neck: no JVD, normal carotid upstroke, no bruit Cardiac:  normal S1, S2; RRR; no murmur Lungs:  clear to auscultation bilaterally, no wheezing, rhonchi or rales Abd: soft, nontender, no hepatomegaly, no bruitsoverweight Ext: no edema, 2+ distal pulses Skin: warm and dry GU: deferred Neuro: no focal abnormalities noted, AAO x 3  EKG:  11/08/13-sinus rhythm rate 88 with relatively short PR interval, nonspecific ST flattening, no other abnormalities. Normal QT interval. Holter monitor: 2010 - 20 PVCs, brief runs of paroxysmal atrial tachycardia.  Labs: 4/15-TSH-normal, CBC, normal.  ASSESSMENT AND PLAN:  1. Palpitations-paroxysmal atrial tachycardia-has been on diltiazem 240 mg once a day.  In 2010, Holter monitor did demonstrate PVCs as well as 2 episodes of paroxysmal atrial tachycardia. No adverse arrhythmias were detected. She is not having any high-risk symptoms such as syncope. Minor side effect of edema noted. 2. Obesity-continue to encourage weight loss.  Signed, Candee Furbish, MD  [facility]  08/09/2014 2:20 PM

## 2014-08-09 NOTE — Patient Instructions (Signed)
The current medical regimen is effective;  continue present plan and medications.  Follow up in 1 year with Dr. Skains.  You will receive a letter in the mail 2 months before you are due.  Please call us when you receive this letter to schedule your follow up appointment.  Thank you for choosing [facility]!!     

## 2014-08-23 ENCOUNTER — Ambulatory Visit (INDEPENDENT_AMBULATORY_CARE_PROVIDER_SITE_OTHER): Payer: Medicare Other | Admitting: Emergency Medicine

## 2014-08-23 ENCOUNTER — Encounter: Payer: Self-pay | Admitting: Emergency Medicine

## 2014-08-23 VITALS — BP 130/72 | HR 67 | Ht 63.5 in | Wt 214.0 lb

## 2014-08-23 DIAGNOSIS — G629 Polyneuropathy, unspecified: Secondary | ICD-10-CM | POA: Diagnosis not present

## 2014-08-23 DIAGNOSIS — E785 Hyperlipidemia, unspecified: Secondary | ICD-10-CM | POA: Diagnosis not present

## 2014-08-23 DIAGNOSIS — J449 Chronic obstructive pulmonary disease, unspecified: Secondary | ICD-10-CM

## 2014-08-23 DIAGNOSIS — J309 Allergic rhinitis, unspecified: Secondary | ICD-10-CM | POA: Diagnosis not present

## 2014-08-23 DIAGNOSIS — F419 Anxiety disorder, unspecified: Secondary | ICD-10-CM | POA: Diagnosis not present

## 2014-08-23 DIAGNOSIS — I10 Essential (primary) hypertension: Secondary | ICD-10-CM | POA: Diagnosis not present

## 2014-08-23 DIAGNOSIS — F329 Major depressive disorder, single episode, unspecified: Secondary | ICD-10-CM | POA: Diagnosis not present

## 2014-08-23 NOTE — Patient Instructions (Signed)
Please continue your advair twice a day Continue loratadine / sudafed once a day Continue singulair daily  Restart your fluticasone nasal spray, 2 sprays each nostril twice a day.  Follow with Dr Lamonte Sakai in 6 months or sooner if you have any problems

## 2014-08-23 NOTE — Progress Notes (Signed)
Subjective:    Patient ID: [name] Molina, female    DOB: 1946/03/09, 69 y.o.   MRN: [id number] HPI 69 yo woman, hx former tobacco and childhood asthma. Formerly followed by Dr Alva Garnet for mild COPD. Also hx GERD, HTN.  She was managed on Advair for years, but now that she is on Medicare she is looking for a less expensive option. She stopped the Advair in November.  Also, since September she has had much more trouble with nasal gtt, throat irritation, cough, throat clearing. More SABA use.   ROV 07/22/11 -- OSA/OHS, COPD, allergies and chronic cough.  Continued singulair, restarted allegra and added Qnasl. She is taking symbicort bid, needs to get financial assistance w this. Her nasal gtt is a lot better now, in fact her nose is dry on the Qnasl. Her breathing seems to be better also.   ROV 09/09/11 -- OSA/OHS, COPD, allergies and cough. We stopped Qnasl due to dryness, tried stopping Symbicort but needed to restart it. She has since run out and is ready to start Dulera 1 puff bid, possibly uptitrate to 2 puffs bid. She is on Nature conservation officer. No exacerbations since last time   ROV 01/07/12 -- OSA/OHS, COPD, allergies and cough.  She had been on Dulera due to cost, but now wants to go back to Advair - same cost. She believes she can stop the singulair if she is back on Advair. She is benefiting from [employer].    ROV 07/13/12 -- OSA/OHS, COPD, allergies and cough. Returns today for f/u - we changed Dulera back to Advair last visit (started 10/13). She had been doing well until about a week ago when she started having nasal gtt, some scratchy voice, occas cough. She stopped singulair.   ROV 11/13/12 -- OSA/OHS, COPD, allergies and cough.  She took singulair for a while last time, now is off of it. She is taking generic allegra. Her allergies are well controlled, cough is better. Uses albuterol rarely. No flares since last time.   ROV 06/04/13 -- OSA/OHS, COPD, allergies and cough. She had a URI in 9/'14  and was treated for an AE by Dr Gwenette Greet.  She is on Advair + albuterol prn, uses rarely. She is taking generic zyrtec right now (not fexofenadine). Not on singulair. No other flares. Has been active.   ROV 08/17/13 -- OSA/OHS, COPD, allergies and cough. She has frequent flares of predominantly UA disease > has been seen by Dr Elsworth Soho and by TP since last visit for probable influenza. Presents today with significant improvement. She had some wheeze, continues to have some nasal drainage. She has run out of loratadine, needs to restart. She still takes Advair, uses albuterol prn, about once a week.   ROV 12/23/13 -- OSA/OHS, COPD, allergies and cough, UA irritation.  She has been doing fairly well. She has been on Advair, uses albuterol prn - a few times a month. No flares since last time. Her diltiazem was recently increased.  She had the prevnar at Dr Orest Dikes office this year.   Acute OV 04/2014 OSA/OHS, COPD, allergies and cough, UA irritation. Complains of increased SOB, prod cough with green mucus, soreness/tightness in upper chest x4 days.   Denies any f/c/s, n/v/d, hemoptysis, chest pain, orthopnea.  Take mucinex without much help.   ROV 08/23/14 -- follow up for OSA/OHS, COPD, allergies and cough, UA irritation. She was hospitalized here in January and treated for probable PNA.  She injured her L knee in February.  She is on loratadine, singulair. Not on the nasal steroid right now. She is on advair bid. Doing well.      Objective:   Physical Exam Filed Vitals:   08/23/14 1615  BP: 130/72  Pulse: 67  Height: 5' 3.5" (1.613 m)  Weight: 214 lb (97.07 kg)  SpO2: 94%   Gen: Pleasant,  in no distress,  normal affect  ENT: No lesions,  mouth clear,  oropharynx clear, no postnasal drip  Neck: No JVD, no TMG, no carotid bruits  Lungs: No use of accessory muscles,  clear without rales or rhonchi  Cardiovascular: RRR, heart sounds normal, no murmur or gallops, no peripheral  edema  Musculoskeletal: No deformities, no cyanosis or clubbing  Neuro: alert, non focal  Skin: Warm, no lesions or rashes     Assessment & Plan:  COPD (chronic obstructive pulmonary disease) Has been stable since being treated for CAP in January. She notes that her "bad time of year" is coming with the Spring.  - will continue her Advair, albuterol - ramp up allergy regimen   Allergic rhinitis Add fluticasone nasal spray to loratadine and singulair

## 2014-08-23 NOTE — Assessment & Plan Note (Signed)
Add fluticasone nasal spray to loratadine and singulair

## 2014-08-23 NOTE — Assessment & Plan Note (Signed)
Has been stable since being treated for CAP in January. She notes that her "bad time of year" is coming with the Spring.  - will continue her Advair, albuterol - ramp up allergy regimen

## 2014-09-06 ENCOUNTER — Encounter (INDEPENDENT_AMBULATORY_CARE_PROVIDER_SITE_OTHER): Payer: Medicare Other | Admitting: Ophthalmology

## 2014-09-06 DIAGNOSIS — H35033 Hypertensive retinopathy, bilateral: Secondary | ICD-10-CM | POA: Diagnosis not present

## 2014-09-06 DIAGNOSIS — H2513 Age-related nuclear cataract, bilateral: Secondary | ICD-10-CM

## 2014-09-06 DIAGNOSIS — H43813 Vitreous degeneration, bilateral: Secondary | ICD-10-CM | POA: Diagnosis not present

## 2014-09-06 DIAGNOSIS — H3531 Nonexudative age-related macular degeneration: Secondary | ICD-10-CM

## 2014-09-06 DIAGNOSIS — H3532 Exudative age-related macular degeneration: Secondary | ICD-10-CM

## 2014-09-06 DIAGNOSIS — I10 Essential (primary) hypertension: Secondary | ICD-10-CM | POA: Diagnosis not present

## 2014-09-07 ENCOUNTER — Encounter (INDEPENDENT_AMBULATORY_CARE_PROVIDER_SITE_OTHER): Payer: Medicare Other | Admitting: Ophthalmology

## 2014-09-16 ENCOUNTER — Encounter: Payer: Self-pay | Admitting: Diagnostic Neuroimaging

## 2014-09-16 ENCOUNTER — Ambulatory Visit (INDEPENDENT_AMBULATORY_CARE_PROVIDER_SITE_OTHER): Payer: Medicare Other | Admitting: Diagnostic Neuroimaging

## 2014-09-16 VITALS — BP 137/70 | HR 86 | Ht 63.5 in | Wt 218.2 lb

## 2014-09-16 DIAGNOSIS — G609 Hereditary and idiopathic neuropathy, unspecified: Secondary | ICD-10-CM

## 2014-09-16 DIAGNOSIS — M4806 Spinal stenosis, lumbar region: Secondary | ICD-10-CM | POA: Diagnosis not present

## 2014-09-16 DIAGNOSIS — M48061 Spinal stenosis, lumbar region without neurogenic claudication: Secondary | ICD-10-CM

## 2014-09-16 NOTE — Progress Notes (Signed)
PATIENT: [name] Molina DOB: 1945/05/25  REASON FOR VISIT: routine follow up for neuropathy HISTORY FROM: patient  Chief Complaint  Patient presents with  . Follow-up    neuropathy     HISTORY OF PRESENT ILLNESS:  UPDATE 4/29/16[place] in Nov 2015. Had pneumonia in Jan 2016. Had left knee strain in Feb 2016. Rarely uses cane. Still active.   UPDATE 03/16/14 (LL): [name] Molina returns for  6 month followup. She states that the numbness in her feet and legs is about the same, not really worse. She denies any pain in her feet or lower legs. She did not do physical therapy after last office visit, she does not remember it being discussed and states that she did not receive a call about scheduling it. She does not feel as though she needs physical therapy at this point. She is very careful with her walking and has not fallen since Christmas 2014.  She does not want to use a cane or walker until she absolutely has to.   NEW HPI (09/14/13):  I previously evaluated patient in 2010 for similar issue. Patient said progressive numbness in her feet, now progressing up to her knees. Patient also has some intermittent low back pain. Patient has significant balance and gait difficulty. She's falling frequently. Last severe fall was in Christmas 2014. She tries to hold onto furniture and walls for balance. She's been reluctant to use a cane or walker. Separately patient also having intermittent shaking and tremors in her hands. This doesn't bother her that much. This is noticed mainly by other people. She still able to do her activities of daily living without interference from the tremor. Patient also having intermittent anxiety, intermittent chest tightness and heart palpitations.   UPDATE 04/19/09: still having numbness in feet and ankles; fell down few weeks ago on stairs; now trying to be more careful.   PRIOR HPI (01/18/09): 69 year old right-handed female with hypertension, paroxysmal atrial tachycardia,  hyperlipidemia, hyperthyroidism status post radiation therapy, presenting for evaluation of a 3 year progressive abnormal sensation in her bilateral feet. The patient describes the sensation as a feeling of her feet being covered in dried mud. She denies burning sensation, pain or tingling sensations. The sensation primary effects the soles of her feet and more recently has extended to her ankle level. She denies muscle weakness. She denies any abnormal sensation in her arms or hands. She does have history of low back pain radiating to left hip but this is not affecting her at the moment. The patient has had 3 falls since May. She attributes these to simple mishaps such as wearing flip-flops in the rain and catching her foot on a step while turning too quickly.     REVIEW OF SYSTEMS: Full 14 system review of systems performed and notable only for: depression anxiety joint pain leg swelling allergies bruising leg swelling.    ALLERGIES: Allergies  Allergen Reactions  . Food Shortness Of Breath    ALLERGY= MUSHROOMS  . Tequin Shortness Of Breath  . Codeine Nausea And Vomiting  . Erythromycin Other (See Comments)    GI UPSET  . Wellbutrin [Bupropion Hcl] Other (See Comments)    SHAKING  . Penicillins Rash  . Prednisone Rash    HOME MEDICATIONS: Outpatient Prescriptions Prior to Visit  Medication Sig Dispense Refill  . albuterol (PROVENTIL HFA;VENTOLIN HFA) 108 (90 BASE) MCG/ACT inhaler Inhale 1 puff into the lungs every 6 (six) hours as needed. Shortness of breath 1 Inhaler  11  . aspirin 81 MG tablet Take 81 mg by mouth daily.      Marland Kitchen aspirin-acetaminophen-caffeine (EXCEDRIN MIGRAINE) 250-250-65 MG per tablet Take 2 tablets by mouth every 6 (six) hours as needed for headache.    . diltiazem (CARTIA XT) 240 MG 24 hr capsule Take 1 capsule (240 mg total) by mouth daily. 30 capsule 11  . FLUoxetine (PROZAC) 20 MG capsule Take 20 mg by mouth Daily.    . fluticasone (FLONASE) 50 MCG/ACT nasal  spray Place 1 spray into both nostrils daily. 16 g 11  . Fluticasone-Salmeterol (ADVAIR DISKUS) 250-50 MCG/DOSE AEPB Inhale 1 puff into the lungs 2 (two) times daily. 60 each 11  . loratadine-pseudoephedrine (CLARITIN-D 12-HOUR) 5-120 MG per tablet Take 1 tablet by mouth daily.    Marland Kitchen losartan (COZAAR) 50 MG tablet Take 50 mg by mouth Daily.    . montelukast (SINGULAIR) 10 MG tablet Take 10 mg by mouth daily.    . Multiple Vitamins-Minerals (OCUVITE PO) Take 1 tablet by mouth daily.     Marland Kitchen omeprazole (PRILOSEC) 20 MG capsule Take 20 mg by mouth Nightly.      . pravastatin (PRAVACHOL) 40 MG tablet Take 40 mg by mouth at bedtime.     . zaleplon (SONATA) 5 MG capsule Take 10 mg by mouth daily.     Marland Kitchen acetaminophen (TYLENOL) 500 MG tablet Take 1,000 mg by mouth every 6 (six) hours as needed for moderate pain or headache.    . clobetasol cream (TEMOVATE) 9.70 % Apply 1 application topically daily as needed (yeast.).     Marland Kitchen ibuprofen (ADVIL,MOTRIN) 200 MG tablet Take 400 mg by mouth every 6 (six) hours as needed for headache or moderate pain.    Marland Kitchen LORazepam (ATIVAN) 0.5 MG tablet Take 0.5-1 mg by mouth daily as needed for anxiety. Anxiety     No facility-administered medications prior to visit.    PHYSICAL EXAM Filed Vitals:   09/16/14 1126  BP: 137/70  Pulse: 86  Height: 5' 3.5" (1.613 m)  Weight: 218 lb 3.2 oz (98.975 kg)   Body mass index is 38.04 kg/(m^2).   GENERAL EXAM:  Patient is in no distress; well developed, nourished and groomed; neck is supple  CARDIOVASCULAR:  Regular rate and rhythm, no murmurs, no carotid bruits  NEUROLOGIC:  MENTAL STATUS: awake, alert, oriented to person, place and time, recent and remote memory intact, normal attention and concentration, language fluent, comprehension intact, naming intact, fund of knowledge appropriate  CRANIAL NERVE: no papilledema on fundoscopic exam, pupils equal and reactive to light, visual fields full to confrontation, extraocular  muscles intact, no nystagmus, facial sensation and strength symmetric, hearing intact, palate elevates symmetrically, uvula midline, shoulder shrug symmetric, tongue midline.  MOTOR: normal bulk and tone, MILD POSTURAL AND ACTION TREMOR IN BUE. NO RIGIDITY. NO BRADYKINESIA. Full strength in the BUE, BLE; EXCEPT BILATERAL KE, KF 4+; BILATERAL DF 3+.  SENSORY: DECR PP, TEMP IN FEET UP TO KNEES. VIB 3 SEC AT TOES. ABSENT PROPRIO AT TOES.  COORDINATION: finger-nose-finger, fine finger movements normal  REFLEXES: BUE 1, KNEES TRACEM ANKLES 0.  GAIT/STATION: SLOW CAUTIOUS GAIT. STEPPAGE GAIT.    DIAGNOSTICS/IMAGING:  02/10/09 MRI LUMBAR  - At L4-5 there is left lateral recess stenosis and moderate spinal stenosis due to left greater than right facet and ligamentum flavum hypertrophy with circumferential disc bulging. This results in potential impingement upon the descending left L5 nerve root.   01/27/09 EMG/NCS - severe length-dependent, axonal, sensorimotor polyneuropathy  with evidence of chronic denervation.   01/18/09 NEUROPATHY LABS  - SPEP, anti-HU, anti-Yo, SSA, SSB, ANA, RPR, cryoglobulins, B6, urine heavy metals, glucose (fasting and 2 hour post-prandial), MMA, homocysteine, B12, TSH - ALL NORMAL   - Vit E - 16.7 (H) - (normal range 3.0-15.8)    ASSESSMENT: 69 y.o. female here with idiopathic neuropathy and lumbar radiculopathy/spinal stenosis. Mild elevation of Vit E; other labs normal. No pain. Mild/mod weakness. Patient continues to fall. High risk for more falling. Patient and I are reluctant to pursue surgical mgmt of lumbar spine issues, because she has concomitant severe neuropathy, which would make her recovery more challenging/limited. Advised conservative mgmt with cane/walker and PT.  PLAN:  - use cane or rollator walker; PT exercises - caution with driving and living alone  Return if symptoms worsen or fail to improve, for return to PCP.    Penni Bombard, MD 09/10/9530,  02:33 PM Certified in Neurology, Neurophysiology and Neuroimaging  [facility] [address] [place], [place] [address] [phone]

## 2014-09-20 ENCOUNTER — Telehealth: Payer: Self-pay | Admitting: Diagnostic Neuroimaging

## 2014-09-20 ENCOUNTER — Other Ambulatory Visit: Payer: Self-pay | Admitting: *Deleted

## 2014-09-20 DIAGNOSIS — G609 Hereditary and idiopathic neuropathy, unspecified: Secondary | ICD-10-CM

## 2014-09-20 MED ORDER — AIRGO ROLLING WALKER MISC: Status: DC

## 2014-09-20 NOTE — Telephone Encounter (Signed)
Called the pt and informed her that I had her prescription available for her for her walker. She asked me to please mail it to her. I will place the prescription for the walker in the mail. Pt stated a thanks

## 2014-09-20 NOTE — Telephone Encounter (Signed)
Patient called wanting to know if she needs a referral/order from Dr. Leta Baptist for her to get a walker. Please call and advice # [phone]

## 2014-10-10 DIAGNOSIS — S0093XA Contusion of unspecified part of head, initial encounter: Secondary | ICD-10-CM | POA: Diagnosis not present

## 2014-10-10 DIAGNOSIS — F419 Anxiety disorder, unspecified: Secondary | ICD-10-CM | POA: Diagnosis not present

## 2014-10-10 DIAGNOSIS — R51 Headache: Secondary | ICD-10-CM | POA: Diagnosis not present

## 2014-11-07 DIAGNOSIS — B001 Herpesviral vesicular dermatitis: Secondary | ICD-10-CM | POA: Diagnosis not present

## 2014-11-07 DIAGNOSIS — F419 Anxiety disorder, unspecified: Secondary | ICD-10-CM | POA: Diagnosis not present

## 2014-11-07 DIAGNOSIS — G44309 Post-traumatic headache, unspecified, not intractable: Secondary | ICD-10-CM | POA: Diagnosis not present

## 2014-11-07 DIAGNOSIS — S0990XS Unspecified injury of head, sequela: Secondary | ICD-10-CM | POA: Diagnosis not present

## 2014-11-29 ENCOUNTER — Encounter (INDEPENDENT_AMBULATORY_CARE_PROVIDER_SITE_OTHER): Payer: Medicare Other | Admitting: Ophthalmology

## 2014-11-29 DIAGNOSIS — H43813 Vitreous degeneration, bilateral: Secondary | ICD-10-CM

## 2014-11-29 DIAGNOSIS — H3532 Exudative age-related macular degeneration: Secondary | ICD-10-CM | POA: Diagnosis not present

## 2014-11-29 DIAGNOSIS — H35053 Retinal neovascularization, unspecified, bilateral: Secondary | ICD-10-CM

## 2014-11-29 DIAGNOSIS — H3531 Nonexudative age-related macular degeneration: Secondary | ICD-10-CM

## 2015-03-01 DIAGNOSIS — D649 Anemia, unspecified: Secondary | ICD-10-CM | POA: Diagnosis not present

## 2015-03-01 DIAGNOSIS — F324 Major depressive disorder, single episode, in partial remission: Secondary | ICD-10-CM | POA: Diagnosis not present

## 2015-03-01 DIAGNOSIS — E785 Hyperlipidemia, unspecified: Secondary | ICD-10-CM | POA: Diagnosis not present

## 2015-03-01 DIAGNOSIS — G629 Polyneuropathy, unspecified: Secondary | ICD-10-CM | POA: Diagnosis not present

## 2015-03-01 DIAGNOSIS — J4520 Mild intermittent asthma, uncomplicated: Secondary | ICD-10-CM | POA: Diagnosis not present

## 2015-03-01 DIAGNOSIS — K219 Gastro-esophageal reflux disease without esophagitis: Secondary | ICD-10-CM | POA: Diagnosis not present

## 2015-03-01 DIAGNOSIS — G4733 Obstructive sleep apnea (adult) (pediatric): Secondary | ICD-10-CM | POA: Diagnosis not present

## 2015-03-01 DIAGNOSIS — Z23 Encounter for immunization: Secondary | ICD-10-CM | POA: Diagnosis not present

## 2015-03-01 DIAGNOSIS — I471 Supraventricular tachycardia: Secondary | ICD-10-CM | POA: Diagnosis not present

## 2015-03-01 DIAGNOSIS — Z0000 Encounter for general adult medical examination without abnormal findings: Secondary | ICD-10-CM | POA: Diagnosis not present

## 2015-03-01 DIAGNOSIS — J309 Allergic rhinitis, unspecified: Secondary | ICD-10-CM | POA: Diagnosis not present

## 2015-03-01 DIAGNOSIS — F419 Anxiety disorder, unspecified: Secondary | ICD-10-CM | POA: Diagnosis not present

## 2015-03-01 DIAGNOSIS — I10 Essential (primary) hypertension: Secondary | ICD-10-CM | POA: Diagnosis not present

## 2015-03-04 DIAGNOSIS — J209 Acute bronchitis, unspecified: Secondary | ICD-10-CM | POA: Diagnosis not present

## 2015-03-14 ENCOUNTER — Encounter (INDEPENDENT_AMBULATORY_CARE_PROVIDER_SITE_OTHER): Payer: Medicare Other | Admitting: Ophthalmology

## 2015-03-14 DIAGNOSIS — I10 Essential (primary) hypertension: Secondary | ICD-10-CM | POA: Diagnosis not present

## 2015-03-14 DIAGNOSIS — H35033 Hypertensive retinopathy, bilateral: Secondary | ICD-10-CM | POA: Diagnosis not present

## 2015-03-14 DIAGNOSIS — H353221 Exudative age-related macular degeneration, left eye, with active choroidal neovascularization: Secondary | ICD-10-CM

## 2015-03-14 DIAGNOSIS — H43813 Vitreous degeneration, bilateral: Secondary | ICD-10-CM | POA: Diagnosis not present

## 2015-03-14 DIAGNOSIS — H353114 Nonexudative age-related macular degeneration, right eye, advanced atrophic with subfoveal involvement: Secondary | ICD-10-CM | POA: Diagnosis not present

## 2015-03-27 DIAGNOSIS — D649 Anemia, unspecified: Secondary | ICD-10-CM | POA: Diagnosis not present

## 2015-04-06 ENCOUNTER — Ambulatory Visit (INDEPENDENT_AMBULATORY_CARE_PROVIDER_SITE_OTHER): Payer: Medicare Other | Admitting: Emergency Medicine

## 2015-04-06 ENCOUNTER — Encounter: Payer: Self-pay | Admitting: Emergency Medicine

## 2015-04-06 VITALS — BP 130/76 | HR 81 | Ht 63.0 in | Wt 220.0 lb

## 2015-04-06 DIAGNOSIS — J449 Chronic obstructive pulmonary disease, unspecified: Secondary | ICD-10-CM

## 2015-04-06 DIAGNOSIS — J309 Allergic rhinitis, unspecified: Secondary | ICD-10-CM

## 2015-04-06 DIAGNOSIS — G4733 Obstructive sleep apnea (adult) (pediatric): Secondary | ICD-10-CM | POA: Diagnosis not present

## 2015-04-06 NOTE — Progress Notes (Signed)
Subjective:    Patient ID: [name] Molina, female    DOB: 1946/03/09, 69 y.o.   MRN: [id number] HPI 69 yo woman, hx former tobacco and childhood asthma. Formerly followed by Dr Alva Garnet for mild COPD. Also hx GERD, HTN.  She was managed on Advair for years, but now that she is on Medicare she is looking for a less expensive option. She stopped the Advair in November.  Also, since September she has had much more trouble with nasal gtt, throat irritation, cough, throat clearing. More SABA use.   ROV 07/22/11 -- OSA/OHS, COPD, allergies and chronic cough.  Continued singulair, restarted allegra and added Qnasl. She is taking symbicort bid, needs to get financial assistance w this. Her nasal gtt is a lot better now, in fact her nose is dry on the Qnasl. Her breathing seems to be better also.   ROV 09/09/11 -- OSA/OHS, COPD, allergies and cough. We stopped Qnasl due to dryness, tried stopping Symbicort but needed to restart it. She has since run out and is ready to start Dulera 1 puff bid, possibly uptitrate to 2 puffs bid. She is on Nature conservation officer. No exacerbations since last time   ROV 01/07/12 -- OSA/OHS, COPD, allergies and cough.  She had been on Dulera due to cost, but now wants to go back to Advair - same cost. She believes she can stop the singulair if she is back on Advair. She is benefiting from [employer].    ROV 07/13/12 -- OSA/OHS, COPD, allergies and cough. Returns today for f/u - we changed Dulera back to Advair last visit (started 10/13). She had been doing well until about a week ago when she started having nasal gtt, some scratchy voice, occas cough. She stopped singulair.   ROV 11/13/12 -- OSA/OHS, COPD, allergies and cough.  She took singulair for a while last time, now is off of it. She is taking generic allegra. Her allergies are well controlled, cough is better. Uses albuterol rarely. No flares since last time.   ROV 06/04/13 -- OSA/OHS, COPD, allergies and cough. She had a URI in 9/'14  and was treated for an AE by Dr Gwenette Greet.  She is on Advair + albuterol prn, uses rarely. She is taking generic zyrtec right now (not fexofenadine). Not on singulair. No other flares. Has been active.   ROV 08/17/13 -- OSA/OHS, COPD, allergies and cough. She has frequent flares of predominantly UA disease > has been seen by Dr Elsworth Soho and by TP since last visit for probable influenza. Presents today with significant improvement. She had some wheeze, continues to have some nasal drainage. She has run out of loratadine, needs to restart. She still takes Advair, uses albuterol prn, about once a week.   ROV 12/23/13 -- OSA/OHS, COPD, allergies and cough, UA irritation.  She has been doing fairly well. She has been on Advair, uses albuterol prn - a few times a month. No flares since last time. Her diltiazem was recently increased.  She had the prevnar at Dr Orest Dikes office this year.   Acute OV 04/2014 OSA/OHS, COPD, allergies and cough, UA irritation. Complains of increased SOB, prod cough with green mucus, soreness/tightness in upper chest x4 days.   Denies any f/c/s, n/v/d, hemoptysis, chest pain, orthopnea.  Take mucinex without much help.   ROV 08/23/14 -- follow up for OSA/OHS, COPD, allergies and cough, UA irritation. She was hospitalized here in January and treated for probable PNA.  She injured her L knee in February.  She is on loratadine, singulair. Not on the nasal steroid right now. She is on advair bid. Doing well.   ROV 04/06/15 -- follow-up visit for COPD, allergic rhinitis, chronic cough and upper airway irritation. She also has obstructive sleep apnea/OHS. She is currently managed on loratadine qd, singulair during the allergy season. Takes fluticasone NS prn. She is on Advair bid. She uses albuterol rarely. She was treated for bronchitis with doxy last month. Flu shot up to date.  Coughs rarely. Exertional tolerance is poor, has to stop with mopping the floor. She has a CPAP but is not been using it  for 3 years. She feels only moderately rested in the am.    CAT Score 04/06/2015 06/04/2013 07/13/2012  Total CAT Score 18 14 13        Objective:   Physical Exam Filed Vitals:   04/06/15 1008  BP: 130/76  Pulse: 81  Height: 5\' 3"  (1.6 m)  Weight: 220 lb (99.791 kg)  SpO2: 95%   Gen: Pleasant,  in no distress,  normal affect  ENT: No lesions,  mouth clear,  oropharynx clear, no postnasal drip  Neck: No JVD, no TMG, no carotid bruits  Lungs: No use of accessory muscles,  clear without rales or rhonchi  Cardiovascular: RRR, heart sounds normal, no murmur or gallops, no peripheral edema  Musculoskeletal: No deformities, no cyanosis or clubbing  Neuro: alert, non focal  Skin: Warm, no lesions or rashes     Assessment & Plan:  COPD (chronic obstructive pulmonary disease) Stable at this time although she was treated for bronchitis in the last month with doxycycline.  We will continue Advair as ordered, concentrate on her nasal allergy regimen.   Allergic rhinitis Please continue loratadine daily Continue singulair daily during the allergy season Consider taking your fluticasone nasal spray on a schedule during the allergy season.  Follow with Dr Lamonte Sakai in 6 months or sooner if you have any problems  Obstructive sleep apnea We could consider performing a repeat CPAP titration and mask fitting.  Flu shot up to date.

## 2015-04-06 NOTE — Assessment & Plan Note (Signed)
Please continue loratadine daily Continue singulair daily during the allergy season Consider taking your fluticasone nasal spray on a schedule during the allergy season.  Follow with Dr Lamonte Sakai in 6 months or sooner if you have any problems

## 2015-04-06 NOTE — Assessment & Plan Note (Signed)
We could consider performing a repeat CPAP titration and mask fitting.  Flu shot up to date.

## 2015-04-06 NOTE — Assessment & Plan Note (Signed)
Stable at this time although she was treated for bronchitis in the last month with doxycycline.  We will continue Advair as ordered, concentrate on her nasal allergy regimen.

## 2015-04-06 NOTE — Patient Instructions (Addendum)
Please continue your advair twice a day  Please continue loratadine daily Continue singulair daily during the allergy season Consider taking your fluticasone nasal spray on a schedule during the allergy season.  We could consider performing a repeat CPAP titration and mask fitting.  Flu shot up to date.  Follow with Dr Lamonte Sakai in 6 months or sooner if you have any problems

## 2015-04-12 DIAGNOSIS — D509 Iron deficiency anemia, unspecified: Secondary | ICD-10-CM | POA: Diagnosis not present

## 2015-04-19 ENCOUNTER — Encounter: Payer: Self-pay | Admitting: Gastroenterology

## 2015-07-04 ENCOUNTER — Encounter (INDEPENDENT_AMBULATORY_CARE_PROVIDER_SITE_OTHER): Payer: Medicare Other | Admitting: Ophthalmology

## 2015-07-04 DIAGNOSIS — H43813 Vitreous degeneration, bilateral: Secondary | ICD-10-CM | POA: Diagnosis not present

## 2015-07-04 DIAGNOSIS — I10 Essential (primary) hypertension: Secondary | ICD-10-CM

## 2015-07-04 DIAGNOSIS — H353221 Exudative age-related macular degeneration, left eye, with active choroidal neovascularization: Secondary | ICD-10-CM | POA: Diagnosis not present

## 2015-07-04 DIAGNOSIS — H35033 Hypertensive retinopathy, bilateral: Secondary | ICD-10-CM

## 2015-07-04 DIAGNOSIS — H353114 Nonexudative age-related macular degeneration, right eye, advanced atrophic with subfoveal involvement: Secondary | ICD-10-CM

## 2015-07-04 DIAGNOSIS — H2513 Age-related nuclear cataract, bilateral: Secondary | ICD-10-CM | POA: Diagnosis not present

## 2015-07-11 DIAGNOSIS — D509 Iron deficiency anemia, unspecified: Secondary | ICD-10-CM | POA: Diagnosis not present

## 2015-07-17 DIAGNOSIS — J0100 Acute maxillary sinusitis, unspecified: Secondary | ICD-10-CM | POA: Diagnosis not present

## 2015-07-19 DIAGNOSIS — G5790 Unspecified mononeuropathy of unspecified lower limb: Secondary | ICD-10-CM | POA: Diagnosis not present

## 2015-07-19 DIAGNOSIS — M79673 Pain in unspecified foot: Secondary | ICD-10-CM | POA: Diagnosis not present

## 2015-07-21 DIAGNOSIS — M25571 Pain in right ankle and joints of right foot: Secondary | ICD-10-CM | POA: Diagnosis not present

## 2015-08-22 ENCOUNTER — Other Ambulatory Visit: Payer: Self-pay | Admitting: Cardiology

## 2015-08-24 DIAGNOSIS — E785 Hyperlipidemia, unspecified: Secondary | ICD-10-CM | POA: Diagnosis not present

## 2015-08-24 DIAGNOSIS — K219 Gastro-esophageal reflux disease without esophagitis: Secondary | ICD-10-CM | POA: Diagnosis not present

## 2015-08-24 DIAGNOSIS — D509 Iron deficiency anemia, unspecified: Secondary | ICD-10-CM | POA: Diagnosis not present

## 2015-08-24 DIAGNOSIS — F419 Anxiety disorder, unspecified: Secondary | ICD-10-CM | POA: Diagnosis not present

## 2015-08-24 DIAGNOSIS — I10 Essential (primary) hypertension: Secondary | ICD-10-CM | POA: Diagnosis not present

## 2015-08-24 DIAGNOSIS — F329 Major depressive disorder, single episode, unspecified: Secondary | ICD-10-CM | POA: Diagnosis not present

## 2015-08-24 DIAGNOSIS — R609 Edema, unspecified: Secondary | ICD-10-CM | POA: Diagnosis not present

## 2015-10-31 ENCOUNTER — Encounter (INDEPENDENT_AMBULATORY_CARE_PROVIDER_SITE_OTHER): Payer: Medicare Other | Admitting: Ophthalmology

## 2015-10-31 DIAGNOSIS — H43813 Vitreous degeneration, bilateral: Secondary | ICD-10-CM | POA: Diagnosis not present

## 2015-10-31 DIAGNOSIS — H35033 Hypertensive retinopathy, bilateral: Secondary | ICD-10-CM

## 2015-10-31 DIAGNOSIS — H353114 Nonexudative age-related macular degeneration, right eye, advanced atrophic with subfoveal involvement: Secondary | ICD-10-CM | POA: Diagnosis not present

## 2015-10-31 DIAGNOSIS — H353221 Exudative age-related macular degeneration, left eye, with active choroidal neovascularization: Secondary | ICD-10-CM | POA: Diagnosis not present

## 2015-10-31 DIAGNOSIS — I10 Essential (primary) hypertension: Secondary | ICD-10-CM

## 2015-11-22 ENCOUNTER — Encounter: Payer: Self-pay | Admitting: Gastroenterology

## 2015-11-22 ENCOUNTER — Telehealth: Payer: Self-pay | Admitting: Gastroenterology

## 2015-11-22 NOTE — Telephone Encounter (Signed)
Spoke with patient and offered OV with APP next week. Moved OV to [employer], [place] on 11/27/15 at 1:30 PM.

## 2015-11-27 ENCOUNTER — Encounter: Payer: Self-pay | Admitting: Gastroenterology

## 2015-11-27 ENCOUNTER — Ambulatory Visit (INDEPENDENT_AMBULATORY_CARE_PROVIDER_SITE_OTHER): Payer: Medicare Other | Admitting: Gastroenterology

## 2015-11-27 ENCOUNTER — Other Ambulatory Visit (INDEPENDENT_AMBULATORY_CARE_PROVIDER_SITE_OTHER): Payer: Medicare Other

## 2015-11-27 VITALS — BP 128/74 | HR 76 | Ht 63.0 in | Wt 220.2 lb

## 2015-11-27 DIAGNOSIS — R1901 Right upper quadrant abdominal swelling, mass and lump: Secondary | ICD-10-CM | POA: Diagnosis not present

## 2015-11-27 DIAGNOSIS — R1011 Right upper quadrant pain: Secondary | ICD-10-CM

## 2015-11-27 DIAGNOSIS — R1310 Dysphagia, unspecified: Secondary | ICD-10-CM

## 2015-11-27 DIAGNOSIS — R1030 Lower abdominal pain, unspecified: Secondary | ICD-10-CM

## 2015-11-27 DIAGNOSIS — R102 Pelvic and perineal pain: Secondary | ICD-10-CM | POA: Insufficient documentation

## 2015-11-27 LAB — BASIC METABOLIC PANEL
BUN: 14 mg/dL (ref 6–23)
CALCIUM: 9.6 mg/dL (ref 8.4–10.5)
CO2: 30 meq/L (ref 19–32)
Chloride: 103 mEq/L (ref 96–112)
Creatinine, Ser: 0.81 mg/dL (ref 0.40–1.20)
GFR: 73.98 mL/min (ref >60.00)
GLUCOSE: 91 mg/dL (ref 70–99)
Potassium: 4.3 mEq/L (ref 3.5–5.1)
Sodium: 141 mEq/L (ref 135–145)

## 2015-11-27 NOTE — Progress Notes (Signed)
11/27/2015 [name] Molina [id number] 1946-02-25   HISTORY OF PRESENT ILLNESS:  This is a 70 year old female who presents to our office today at the request of her PCP, Dr. Dema Severin, for evaluation of a couple different complaints. Primarily she complains of right upper quadrant abdominal pain that she says has been present for several weeks-months.  It is described as a constant pain that has become very uncomfortable to her, yet,she has not taken anything in particular for this pain. It appears that she had an ultrasound in October 2014 for evaluation of right upper quadrant abdominal pain and the study showed only hepatic steatosis at that time. She says that she feels a bulge on the right side of her abdomen. She also reports that she had a couple episodes of suprapubic abdominal pain when she is trying to move her bowels. Her last colonoscopy was in July 2012 by Dr. Deatra Ina at which time she is only found have moderate diverticulosis in the sigmoid colon and internal hemorrhoids; quality of prep was good.    She also reports intermittent dysphagia/difficulty swallowing her pills and sometimes liquids. No issues swallowing solid foods. Most recent hemoglobin that I have is from April that showed a normal white blood cell count, normal hemoglobin, normal platelets.  Past Medical History  Diagnosis Date  . COPD (chronic obstructive pulmonary disease) ([place])   . Asthma   . GERD (gastroesophageal reflux disease)   . Hyperlipidemia   . Hypertension   . Atrial tachycardia ([place])   . Diverticulosis   . Insomnia   . Anxiety   . Neuropathy ([place])   . Macular degeneration   . Migraines    Past Surgical History  Procedure Laterality Date  . Colonoscopy    . Removed cartliage rib    . Polypectomy    . Cervical polypectomy      INSIDE AND OUTSIDE    reports that she quit smoking about 24 years ago. Her smoking use included Cigarettes. She has a 20 pack-year smoking history. She has never used  smokeless tobacco. She reports that she does not drink alcohol or use illicit drugs. family history includes Asthma in her father; Breast cancer (age of onset: 35) in her maternal grandmother; Colon polyps (age of onset: 72) in her sister; Emphysema in her father; Heart disease in her father and paternal grandfather. There is no history of Colon cancer. Allergies  Allergen Reactions  . Food Shortness Of Breath    ALLERGY= MUSHROOMS  . Tequin Shortness Of Breath  . Codeine Nausea And Vomiting  . Erythromycin Other (See Comments)    GI UPSET  . Wellbutrin [Bupropion Hcl] Other (See Comments)    SHAKING  . Penicillins Rash  . Prednisone Rash      Outpatient Encounter Prescriptions as of 11/27/2015  Medication Sig  . acetaminophen (TYLENOL) 500 MG tablet Take 1,000 mg by mouth every 6 (six) hours as needed for moderate pain or headache.  . albuterol (PROVENTIL HFA;VENTOLIN HFA) 108 (90 BASE) MCG/ACT inhaler Inhale 1 puff into the lungs every 6 (six) hours as needed. Shortness of breath  . aspirin 81 MG tablet Take 81 mg by mouth daily.    Marland Kitchen aspirin-acetaminophen-caffeine (EXCEDRIN MIGRAINE) 250-250-65 MG per tablet Take 2 tablets by mouth every 6 (six) hours as needed for headache.  Marland Kitchen BESIVANCE 0.6 % SUSP   . clobetasol cream (TEMOVATE) [id number] % Apply 1 application topically daily as needed (yeast.).   Marland Kitchen diltiazem (CARDIZEM CD) 240 MG  24 hr capsule TAKE 1 CAPSULE(240 MG) BY MOUTH DAILY  . FLUoxetine (PROZAC) 20 MG capsule Take 40 mg by mouth Daily.   . fluticasone (FLONASE) 50 MCG/ACT nasal spray Place 1 spray into both nostrils daily.  . Fluticasone-Salmeterol (ADVAIR DISKUS) 250-50 MCG/DOSE AEPB Inhale 1 puff into the lungs 2 (two) times daily.  Marland Kitchen ibuprofen (ADVIL,MOTRIN) 200 MG tablet Take 400 mg by mouth every 6 (six) hours as needed for headache or moderate pain.  Marland Kitchen lactase (LACTAID) 3000 units tablet Take by mouth 3 (three) times daily with meals.  Marland Kitchen loratadine-pseudoephedrine  (CLARITIN-D 12-HOUR) 5-120 MG per tablet Take 1 tablet by mouth daily.  Marland Kitchen LORazepam (ATIVAN) 0.5 MG tablet Take 0.5-1 mg by mouth daily as needed for anxiety. Anxiety  . losartan (COZAAR) 50 MG tablet Take 50 mg by mouth Daily.  . Misc. Devices (AIRGO ROLLING WALKER) MISC Rollator walker with bench and brakes  . montelukast (SINGULAIR) 10 MG tablet Take 10 mg by mouth daily.  . Multiple Vitamins-Minerals (OCUVITE PO) Take 1 tablet by mouth daily.   Marland Kitchen omeprazole (PRILOSEC) 20 MG capsule Take 20 mg by mouth Nightly.    Marland Kitchen POLY-IRON 150 150 MG capsule TK 1 C PO QD  . pravastatin (PRAVACHOL) 40 MG tablet Take 40 mg by mouth at bedtime.   . zaleplon (SONATA) 5 MG capsule Take 10 mg by mouth daily.    No facility-administered encounter medications on file as of 11/27/2015.     REVIEW OF SYSTEMS  : All other systems reviewed and negative except where noted in the History of Present Illness.   PHYSICAL EXAM: BP 128/74 mmHg  Pulse 76  Ht 5\' 3"  (1.6 m)  Wt 220 lb 4 oz (99.905 kg)  BMI 39.03 kg/m2 General: Well developed white female in no acute distress Head: Normocephalic and atraumatic Eyes:  Sclerae anicteric, conjunctiva pink. Ears: Normal auditory acuity Lungs: Clear throughout to auscultation Heart: Regular rate and rhythm Abdomen: Soft, non-distended.  Normal bowel sounds.  TTP in RUQ, ? Mass in RUQ.  Suprapubic TTP as well.  Scar noted in RUQ. Musculoskeletal: Symmetrical with no gross deformities  Skin: No lesions on visible extremities Extremities: No edema  Neurological: Alert oriented x 4, grossly non-focal Psychological:  Alert and cooperative. Normal mood and affect  ASSESSMENT AND PLAN: -70 year old female with complaints of right upper quadrant abdominal pain.  She does have some palpable mass in her right upper quadrant, question if this is some type of accessory rib or cartilage but her pain is over and around that area. We'll check CT scan of the abdomen and  pelvis. -Suprapubic pain:  Intermittent, but there is some tenderness on exam today. Suspect that this is unrelated to her right upper quadrant abdominal pain. We'll check CT scan as stated above. -Dysphagia:  Intermittent dysphagia to pills and occasionally liquids. We'll check barium esophagram  CC:  Harlan Stains, MD

## 2015-11-27 NOTE — Patient Instructions (Addendum)
Please go to the basement level to have your labs drawn.  You have been scheduled for a Barium Esophogram at [facility] on 11-29-2015 at 10:00 am. Go to the [facility]. They do have [place] parking.  Please arrive at 9:45 am  to your appointment for registration. Make certain not to have anything to eat or drink 4 hours prior to your test. ( Nothing by mouth past 6:00 am.  If you need to reschedule for any reason, please contact radiology at [phone] to do so.   You have been scheduled for a CT scan of the abdomen and pelvis at [facility] ([address]---this is in the same building as Press [facility]).   You are scheduled on Friday 7-14 at 10:30 am. You should arrive at 10: 15 am  to your appointment time for registration. Please follow the written instructions below on the day of your exam:  WARNING: IF YOU ARE ALLERGIC TO IODINE/X-RAY DYE, PLEASE NOTIFY RADIOLOGY IMMEDIATELY AT [phone]! YOU WILL BE GIVEN A 13 HOUR PREMEDICATION PREP.  1) Do not eat anything after 6:30  am (4 hours prior to your test) 2) You have been given 2 bottles of oral contrast to drink. The solution may taste better if refrigerated, but do NOT add ice or any other liquid to this solution. Shake well before drinking.    Drink 1 bottle of contrast @  (2 hours prior to your exam)  Drink 1 bottle of contrast @  (1 hour prior to your exam)  You may take any medications as prescribed with a small amount of water except for the following: Metformin, Glucophage, Glucovance, Avandamet, Riomet, Fortamet, Actoplus Met, Janumet, Glumetza or Metaglip. The above medications must be held the day of the exam AND 48 hours after the exam.  The purpose of you drinking the oral contrast is to aid in the visualization of your intestinal tract. The contrast solution may cause some diarrhea. Before your exam is started, you will be given a small amount of fluid to drink. Depending on your individual set of  symptoms, you may also receive an intravenous injection of x-ray contrast/dye. Plan on being at [facility] for 30 minutes or long, depending on the type of exam you are having performed.  If you have any questions regarding your exam or if you need to reschedule, you may call the CT department at [phone] between the hours of 8:00 am and 5:00 pm, Monday-Friday.  ________________________________________________________________________  __________________________________________________________________ A barium swallow is an examination that concentrates on views of the esophagus. This tends to be a double contrast exam (barium and two liquids which, when combined, create a gas to distend the wall of the oesophagus) or single contrast (non-ionic iodine based). The study is usually tailored to your symptoms so a good history is essential. Attention is paid during the study to the form, structure and configuration of the esophagus, looking for functional disorders (such as aspiration, dysphagia, achalasia, motility and reflux) EXAMINATION You may be asked to change into a gown, depending on the type of swallow being performed. A radiologist and radiographer will perform the procedure. The radiologist will advise you of the type of contrast selected for your procedure and direct you during the exam. You will be asked to stand, sit or lie in several different positions and to hold a small amount of fluid in your mouth before being asked to swallow while the imaging is performed .In some instances you may be  asked to swallow barium coated marshmallows to assess the motility of a solid food bolus. The exam can be recorded as a digital or video fluoroscopy procedure. POST PROCEDURE It will take 1-2 days for the barium to pass through your system. To facilitate this, it is important, unless otherwise directed, to increase your fluids for the next 24-48hrs and to resume your normal diet.  This test  typically takes about 30 minutes to perform. __________________________________________________________________________________

## 2015-11-27 NOTE — Progress Notes (Signed)
Reviewed and agree with documentation and assessment and plan. K. Veena Jakaree Pickard , MD   

## 2015-11-29 ENCOUNTER — Ambulatory Visit (HOSPITAL_COMMUNITY)
Admission: RE | Admit: 2015-11-29 | Discharge: 2015-11-29 | Disposition: A | Discharge status: Home / Self Care | Payer: Medicare Other | Source: Ambulatory Visit | Attending: Gastroenterology | Admitting: Gastroenterology

## 2015-11-29 DIAGNOSIS — R1030 Lower abdominal pain, unspecified: Secondary | ICD-10-CM

## 2015-11-29 DIAGNOSIS — R1310 Dysphagia, unspecified: Secondary | ICD-10-CM

## 2015-11-29 DIAGNOSIS — R1901 Right upper quadrant abdominal swelling, mass and lump: Secondary | ICD-10-CM

## 2015-11-29 DIAGNOSIS — R1011 Right upper quadrant pain: Secondary | ICD-10-CM

## 2015-12-01 ENCOUNTER — Ambulatory Visit (HOSPITAL_COMMUNITY)
Admission: RE | Admit: 2015-12-01 | Discharge: 2015-12-01 | Disposition: A | Discharge status: Home / Self Care | Payer: Medicare Other | Source: Ambulatory Visit | Attending: Gastroenterology | Admitting: Gastroenterology

## 2015-12-01 ENCOUNTER — Ambulatory Visit (INDEPENDENT_AMBULATORY_CARE_PROVIDER_SITE_OTHER)
Admission: RE | Admit: 2015-12-01 | Discharge: 2015-12-01 | Disposition: A | Discharge status: Home / Self Care | Payer: Medicare Other | Source: Ambulatory Visit | Attending: Gastroenterology | Admitting: Gastroenterology

## 2015-12-01 DIAGNOSIS — R1011 Right upper quadrant pain: Secondary | ICD-10-CM | POA: Insufficient documentation

## 2015-12-01 DIAGNOSIS — R1310 Dysphagia, unspecified: Secondary | ICD-10-CM

## 2015-12-01 DIAGNOSIS — R1901 Right upper quadrant abdominal swelling, mass and lump: Secondary | ICD-10-CM

## 2015-12-01 DIAGNOSIS — R1030 Lower abdominal pain, unspecified: Secondary | ICD-10-CM | POA: Insufficient documentation

## 2015-12-01 MED ORDER — IOPAMIDOL (ISOVUE-300) INJECTION 61%
100.0000 mL | Freq: Once | INTRAVENOUS | Status: AC | PRN
  Administered 2015-12-01: 100 mL via INTRAVENOUS

## 2015-12-04 ENCOUNTER — Telehealth: Payer: Self-pay | Admitting: Gastroenterology

## 2015-12-05 NOTE — Telephone Encounter (Signed)
Patient is calling for results of CT and esophagram. Please, advise.

## 2015-12-11 DIAGNOSIS — R05 Cough: Secondary | ICD-10-CM | POA: Diagnosis not present

## 2015-12-11 DIAGNOSIS — R1011 Right upper quadrant pain: Secondary | ICD-10-CM | POA: Diagnosis not present

## 2015-12-12 ENCOUNTER — Other Ambulatory Visit: Payer: Self-pay | Admitting: Family Medicine

## 2015-12-12 DIAGNOSIS — R1011 Right upper quadrant pain: Secondary | ICD-10-CM

## 2015-12-19 ENCOUNTER — Ambulatory Visit
Admission: RE | Admit: 2015-12-19 | Discharge: 2015-12-19 | Disposition: A | Discharge status: Home / Self Care | Payer: Medicare Other | Source: Ambulatory Visit | Attending: Family Medicine | Admitting: Family Medicine

## 2015-12-19 ENCOUNTER — Other Ambulatory Visit: Payer: Self-pay | Admitting: Family Medicine

## 2015-12-19 DIAGNOSIS — R059 Cough, unspecified: Secondary | ICD-10-CM

## 2015-12-19 DIAGNOSIS — R05 Cough: Secondary | ICD-10-CM

## 2015-12-19 DIAGNOSIS — R1011 Right upper quadrant pain: Secondary | ICD-10-CM

## 2015-12-19 DIAGNOSIS — R062 Wheezing: Secondary | ICD-10-CM | POA: Diagnosis not present

## 2016-01-25 ENCOUNTER — Ambulatory Visit: Payer: Medicare Other | Admitting: Gastroenterology

## 2016-02-20 ENCOUNTER — Encounter (INDEPENDENT_AMBULATORY_CARE_PROVIDER_SITE_OTHER): Payer: Medicare Other | Admitting: Ophthalmology

## 2016-02-20 DIAGNOSIS — H353114 Nonexudative age-related macular degeneration, right eye, advanced atrophic with subfoveal involvement: Secondary | ICD-10-CM

## 2016-02-20 DIAGNOSIS — H353221 Exudative age-related macular degeneration, left eye, with active choroidal neovascularization: Secondary | ICD-10-CM | POA: Diagnosis not present

## 2016-02-20 DIAGNOSIS — H35033 Hypertensive retinopathy, bilateral: Secondary | ICD-10-CM | POA: Diagnosis not present

## 2016-02-20 DIAGNOSIS — H43813 Vitreous degeneration, bilateral: Secondary | ICD-10-CM | POA: Diagnosis not present

## 2016-02-20 DIAGNOSIS — I10 Essential (primary) hypertension: Secondary | ICD-10-CM

## 2016-03-06 DIAGNOSIS — H25011 Cortical age-related cataract, right eye: Secondary | ICD-10-CM | POA: Diagnosis not present

## 2016-03-06 DIAGNOSIS — H353221 Exudative age-related macular degeneration, left eye, with active choroidal neovascularization: Secondary | ICD-10-CM | POA: Diagnosis not present

## 2016-03-06 DIAGNOSIS — H2512 Age-related nuclear cataract, left eye: Secondary | ICD-10-CM | POA: Diagnosis not present

## 2016-03-06 DIAGNOSIS — H353114 Nonexudative age-related macular degeneration, right eye, advanced atrophic with subfoveal involvement: Secondary | ICD-10-CM | POA: Diagnosis not present

## 2016-03-06 DIAGNOSIS — H35033 Hypertensive retinopathy, bilateral: Secondary | ICD-10-CM | POA: Diagnosis not present

## 2016-03-06 DIAGNOSIS — H25042 Posterior subcapsular polar age-related cataract, left eye: Secondary | ICD-10-CM | POA: Diagnosis not present

## 2016-03-06 DIAGNOSIS — H2511 Age-related nuclear cataract, right eye: Secondary | ICD-10-CM | POA: Diagnosis not present

## 2016-03-06 DIAGNOSIS — H25012 Cortical age-related cataract, left eye: Secondary | ICD-10-CM | POA: Diagnosis not present

## 2016-03-07 DIAGNOSIS — Z23 Encounter for immunization: Secondary | ICD-10-CM | POA: Diagnosis not present

## 2016-03-07 DIAGNOSIS — E785 Hyperlipidemia, unspecified: Secondary | ICD-10-CM | POA: Diagnosis not present

## 2016-03-07 DIAGNOSIS — I10 Essential (primary) hypertension: Secondary | ICD-10-CM | POA: Diagnosis not present

## 2016-03-07 DIAGNOSIS — G4733 Obstructive sleep apnea (adult) (pediatric): Secondary | ICD-10-CM | POA: Diagnosis not present

## 2016-03-07 DIAGNOSIS — D509 Iron deficiency anemia, unspecified: Secondary | ICD-10-CM | POA: Diagnosis not present

## 2016-03-07 DIAGNOSIS — F3341 Major depressive disorder, recurrent, in partial remission: Secondary | ICD-10-CM | POA: Diagnosis not present

## 2016-03-07 DIAGNOSIS — Z0000 Encounter for general adult medical examination without abnormal findings: Secondary | ICD-10-CM | POA: Diagnosis not present

## 2016-03-09 ENCOUNTER — Encounter (HOSPITAL_BASED_OUTPATIENT_CLINIC_OR_DEPARTMENT_OTHER): Payer: Self-pay | Admitting: Emergency Medicine

## 2016-03-09 ENCOUNTER — Emergency Department (HOSPITAL_BASED_OUTPATIENT_CLINIC_OR_DEPARTMENT_OTHER)
Admission: EM | Admit: 2016-03-09 | Discharge: 2016-03-09 | Disposition: A | Discharge status: Home / Self Care | Payer: Medicare Other | Attending: Emergency Medicine | Admitting: Emergency Medicine

## 2016-03-09 ENCOUNTER — Emergency Department (HOSPITAL_BASED_OUTPATIENT_CLINIC_OR_DEPARTMENT_OTHER): Payer: Medicare Other

## 2016-03-09 DIAGNOSIS — J45909 Unspecified asthma, uncomplicated: Secondary | ICD-10-CM | POA: Diagnosis not present

## 2016-03-09 DIAGNOSIS — Z87891 Personal history of nicotine dependence: Secondary | ICD-10-CM | POA: Insufficient documentation

## 2016-03-09 DIAGNOSIS — Y999 Unspecified external cause status: Secondary | ICD-10-CM | POA: Diagnosis not present

## 2016-03-09 DIAGNOSIS — Z79899 Other long term (current) drug therapy: Secondary | ICD-10-CM | POA: Insufficient documentation

## 2016-03-09 DIAGNOSIS — J449 Chronic obstructive pulmonary disease, unspecified: Secondary | ICD-10-CM | POA: Insufficient documentation

## 2016-03-09 DIAGNOSIS — W1800XA Striking against unspecified object with subsequent fall, initial encounter: Secondary | ICD-10-CM | POA: Insufficient documentation

## 2016-03-09 DIAGNOSIS — S79911A Unspecified injury of right hip, initial encounter: Secondary | ICD-10-CM | POA: Diagnosis not present

## 2016-03-09 DIAGNOSIS — S0083XA Contusion of other part of head, initial encounter: Secondary | ICD-10-CM | POA: Diagnosis not present

## 2016-03-09 DIAGNOSIS — S7001XA Contusion of right hip, initial encounter: Secondary | ICD-10-CM

## 2016-03-09 DIAGNOSIS — I10 Essential (primary) hypertension: Secondary | ICD-10-CM | POA: Insufficient documentation

## 2016-03-09 DIAGNOSIS — S0990XA Unspecified injury of head, initial encounter: Secondary | ICD-10-CM

## 2016-03-09 DIAGNOSIS — Y9389 Activity, other specified: Secondary | ICD-10-CM | POA: Insufficient documentation

## 2016-03-09 DIAGNOSIS — S0011XA Contusion of right eyelid and periocular area, initial encounter: Secondary | ICD-10-CM | POA: Diagnosis not present

## 2016-03-09 DIAGNOSIS — S0093XA Contusion of unspecified part of head, initial encounter: Secondary | ICD-10-CM | POA: Diagnosis not present

## 2016-03-09 DIAGNOSIS — Y929 Unspecified place or not applicable: Secondary | ICD-10-CM | POA: Diagnosis not present

## 2016-03-09 DIAGNOSIS — Z7982 Long term (current) use of aspirin: Secondary | ICD-10-CM | POA: Diagnosis not present

## 2016-03-09 DIAGNOSIS — M25551 Pain in right hip: Secondary | ICD-10-CM | POA: Diagnosis not present

## 2016-03-09 HISTORY — DX: Unspecified cataract: H26.9

## 2016-03-09 NOTE — ED Provider Notes (Signed)
[facility] DEPT [facility] Provider Note   CSN: [id number] Arrival date & time: 03/09/16  1738   By signing my name below, I, Neta Mends, attest that this documentation has been prepared under the direction and in the presence of Davonna Belling, MD . Electronically Signed: Neta Mends, ED Scribe. 03/09/2016. 7:39 PM.   History   Chief Complaint Chief Complaint  Patient presents with  . Head Injury    The history is provided by the patient. No language interpreter was used.   HPI Comments:  [name] Molina is a 70 y.o. female who presents to the Emergency Department s/p a head injury that occurred today at 1500. Pt states that she fell and hit her face while grocery shopping and sustained an abrasion to the right side of her forehead. Pt complains of associated hip pain, headache, and eye pain. Pt admits to being unsteady frequently and had a similar injury 6 months ago. Pt has been ambulatory. Pt takes aspirin daily. No other alleviating factors noted. Pt denies visual disturbance.   Past Medical History:  Diagnosis Date  . Anxiety   . Asthma   . Atrial tachycardia ([place])   . Cataract   . COPD (chronic obstructive pulmonary disease) ([place])   . Diverticulosis   . GERD (gastroesophageal reflux disease)   . Hyperlipidemia   . Hypertension   . Insomnia   . Macular degeneration   . Migraines   . Neuropathy [facility])     Patient Active Problem List   Diagnosis Date Noted  . RUQ abdominal pain 11/27/2015  . Suprapubic pain 11/27/2015  . Dysphagia 11/27/2015  . Abdominal mass, RUQ (right upper quadrant) 11/27/2015  . Obstructive sleep apnea 04/06/2015  . PAT (paroxysmal atrial tachycardia) ([place]) 08/09/2014  . CAP (community acquired pneumonia) 05/22/2014  . GERD (gastroesophageal reflux disease)   . Hyperlipidemia   . Spinal stenosis of lumbar region 03/16/2014  . Idiopathic peripheral neuropathy 03/16/2014  . Hypertension 06/13/2011  . Allergic rhinitis  06/13/2011  . Chronic headaches 06/13/2011  . Emphysema 06/13/2011  . COPD (chronic obstructive pulmonary disease) ([place]) 04/25/2007    Past Surgical History:  Procedure Laterality Date  . CERVICAL POLYPECTOMY     INSIDE AND OUTSIDE  . COLONOSCOPY    . POLYPECTOMY    . REMOVED CARTLIAGE RIB      OB History    No data available       Home Medications    Prior to Admission medications   Medication Sig Start Date End Date Taking? Authorizing Provider  albuterol (PROVENTIL HFA;VENTOLIN HFA) 108 (90 BASE) MCG/ACT inhaler Inhale 1 puff into the lungs every 6 (six) hours as needed. Shortness of breath 06/04/13  Yes Collene Gobble, MD  aspirin 81 MG tablet Take 81 mg by mouth daily.     Yes Historical Provider, MD  diltiazem (CARTIA XT) 240 MG 24 hr capsule Take 240 mg by mouth daily.   Yes Historical Provider, MD  FLUoxetine (PROZAC) 20 MG capsule Take 40 mg by mouth Daily.  10/23/10  Yes Historical Provider, MD  fluticasone (FLONASE) 50 MCG/ACT nasal spray Place 1 spray into both nostrils daily. 08/17/13  Yes Collene Gobble, MD  Fluticasone-Salmeterol (ADVAIR DISKUS) 250-50 MCG/DOSE AEPB Inhale 1 puff into the lungs 2 (two) times daily. 01/07/12  Yes Collene Gobble, MD  lactase (LACTAID) 3000 units tablet Take by mouth 3 (three) times daily with meals.   Yes Historical Provider, MD  loratadine-pseudoephedrine (CLARITIN-D 12-HOUR) 5-120 MG  per tablet Take 1 tablet by mouth daily.   Yes Historical Provider, MD  LORazepam (ATIVAN) 0.5 MG tablet Take 0.5-1 mg by mouth daily as needed for anxiety. Anxiety   Yes Historical Provider, MD  losartan (COZAAR) 50 MG tablet Take 50 mg by mouth Daily. 10/27/10  Yes Historical Provider, MD  Misc. Devices (AIRGO ROLLING WALKER) MISC Rollator walker with bench and brakes 09/20/14  Yes Penni Bombard, MD  montelukast (SINGULAIR) 10 MG tablet Take 10 mg by mouth daily. 02/21/14  Yes Historical Provider, MD  Multiple Vitamins-Minerals (OCUVITE PO) Take 1 tablet  by mouth daily.    Yes Historical Provider, MD  omeprazole (PRILOSEC) 20 MG capsule Take 20 mg by mouth Nightly.     Yes Historical Provider, MD  POLY-IRON 150 150 MG capsule TK 1 C PO QD 08/24/15  Yes Historical Provider, MD  pravastatin (PRAVACHOL) 40 MG tablet Take 40 mg by mouth at bedtime.  09/04/10  Yes Historical Provider, MD  zaleplon (SONATA) 5 MG capsule Take 10 mg by mouth daily.  08/31/13  Yes Historical Provider, MD  acetaminophen (TYLENOL) 500 MG tablet Take 1,000 mg by mouth every 6 (six) hours as needed for moderate pain or headache.    Historical Provider, MD  aspirin-acetaminophen-caffeine (EXCEDRIN MIGRAINE) [phone] MG per tablet Take 2 tablets by mouth every 6 (six) hours as needed for headache.    Historical Provider, MD  BESIVANCE 0.6 % SUSP  09/07/14   Historical Provider, MD  clobetasol cream (TEMOVATE) [id number] % Apply 1 application topically daily as needed (yeast.).  11/01/13   Historical Provider, MD  diltiazem (CARDIZEM CD) 240 MG 24 hr capsule TAKE 1 CAPSULE(240 MG) BY MOUTH DAILY 08/22/15   Jerline Pain, MD  ibuprofen (ADVIL,MOTRIN) 200 MG tablet Take 400 mg by mouth every 6 (six) hours as needed for headache or moderate pain.    Historical Provider, MD    Family History Family History  Problem Relation Age of Onset  . Colon polyps Sister 33    PRECANCEROUS POLYPS  . Breast cancer Maternal Grandmother 6  . Emphysema Father   . Asthma Father   . Heart disease Father   . Heart disease Paternal Grandfather   . Colon cancer Neg Hx     Social History Social History  Substance Use Topics  . Smoking status: Former Smoker    Packs/day: 1.00    Years: 20.00    Types: Cigarettes    Quit date: 05/21/1991  . Smokeless tobacco: Never Used  . Alcohol use No     Allergies   Food; Tequin; Codeine; Erythromycin; Levaquin [levofloxacin in d5w]; Wellbutrin [bupropion hcl]; Penicillins; and Prednisone   Review of Systems Review of Systems  Eyes: Positive for pain.  Negative for visual disturbance.  Musculoskeletal: Positive for arthralgias and myalgias.  Skin: Positive for wound.  Neurological: Positive for headaches.  All other systems reviewed and are negative.    Physical Exam Updated Vital Signs BP (!) 120/51 (BP Location: Right Arm)   Pulse 76   Temp 97.9 F (36.6 C) (Oral)   Resp 20   Ht 5\' 3"  (1.6 m)   Wt 200 lb (90.7 kg)   SpO2 97%   BMI 35.43 kg/m   Physical Exam  Constitutional: She appears well-developed and well-nourished. No distress.  HENT:  Head: Normocephalic and atraumatic.  Hematoma over right lateral eye brow; no underlying deformity. No hemotympanum.  Eyes: Conjunctivae are normal.  Neck: Neck supple.  Cardiovascular: Normal rate  and regular rhythm.   No murmur heard. Pulmonary/Chest: Effort normal and breath sounds normal. No respiratory distress.  Abdominal: Soft. There is no tenderness.  Musculoskeletal: She exhibits tenderness. She exhibits no edema.  Tenderness over right hip laterally  Neurological: She is alert.  Skin: Skin is warm and dry.  Psychiatric: She has a normal mood and affect.  Nursing note and vitals reviewed.    ED Treatments / Results  DIAGNOSTIC STUDIES:  Oxygen Saturation is 96% on RA, normal by my interpretation.    COORDINATION OF CARE:  7:39 PM Discussed treatment plan with pt at bedside and pt agreed to plan.   Labs (all labs ordered are listed, but only abnormal results are displayed) Labs Reviewed - No data to display  EKG  EKG Interpretation None       Radiology Dg Hip Unilat W Or Wo Pelvis 2-3 Views Right  Result Date: 03/09/2016 CLINICAL DATA:  Patient fell on loading groceries.  Right hip pain. EXAM: DG HIP (WITH OR WITHOUT PELVIS) 2-3V RIGHT COMPARISON:  None. FINDINGS: There is no evidence of hip fracture or dislocation. Mild enthesopathy at the gluteal insertions on both greater trochanters. Lower lumbar facet hypertrophy and sclerosis at L4-5 and L5-S1.  Osteoarthritis of the sacroiliac joints and pubic symphysis with sclerosis and slight joint space narrowing. IMPRESSION: No acute osseous abnormality of the bony pelvis and hip. Lower lumbar facet arthropathy. Sacroiliac joint and pubic symphysis osteoarthritis. Electronically Signed   By: Ashley Royalty M.D.   On: 03/09/2016 20:09    Procedures Procedures (including critical care time)  Medications Ordered in ED Medications - No data to display   Initial Impression / Assessment and Plan / ED Course  I have reviewed the triage vital signs and the nursing notes.  Pertinent labs & imaging results that were available during my care of the patient were reviewed by me and considered in my medical decision making (see chart for details).  Clinical Course    Patient with fall. Tenderness right hip but negative x-ray. Doesn't hematoma to face. Has been 5 hours since injury and no focal findings. Just slight headache. I think she does not need CT imaging at this time. Discussed with patient and her son. Discharged home.  Final Clinical Impressions(s) / ED Diagnoses   Final diagnoses:  Minor head injury, initial encounter  Contusion of right hip, initial encounter  Facial hematoma, initial encounter    New Prescriptions New Prescriptions   No medications on file  I personally performed the services described in this documentation, which was scribed in my presence. The recorded information has been reviewed and is accurate.        Davonna Belling, MD 03/09/16 2018

## 2016-03-09 NOTE — ED Triage Notes (Signed)
Pt fell while unloading groceries from car.  History of many falls and admits to being unsteady on her feet.  Pt denies LOC.

## 2016-03-09 NOTE — ED Notes (Signed)
Patient transported to X-ray 

## 2016-03-09 NOTE — ED Triage Notes (Signed)
Small hematoma approx 1 in diameter noted to pt's right forehead above eyebrow.  Pt states mild headache since fall and mild right side rib pain from fall that has mostly resolved on it's own since.

## 2016-03-15 DIAGNOSIS — H25012 Cortical age-related cataract, left eye: Secondary | ICD-10-CM | POA: Diagnosis not present

## 2016-03-15 DIAGNOSIS — H25812 Combined forms of age-related cataract, left eye: Secondary | ICD-10-CM | POA: Diagnosis not present

## 2016-03-15 DIAGNOSIS — H2512 Age-related nuclear cataract, left eye: Secondary | ICD-10-CM | POA: Diagnosis not present

## 2016-03-19 ENCOUNTER — Other Ambulatory Visit: Payer: Self-pay | Admitting: Cardiology

## 2016-03-25 DIAGNOSIS — J209 Acute bronchitis, unspecified: Secondary | ICD-10-CM | POA: Diagnosis not present

## 2016-04-04 DIAGNOSIS — Z124 Encounter for screening for malignant neoplasm of cervix: Secondary | ICD-10-CM | POA: Diagnosis not present

## 2016-04-04 DIAGNOSIS — Z6838 Body mass index (BMI) 38.0-38.9, adult: Secondary | ICD-10-CM | POA: Diagnosis not present

## 2016-04-04 DIAGNOSIS — Z1231 Encounter for screening mammogram for malignant neoplasm of breast: Secondary | ICD-10-CM | POA: Diagnosis not present

## 2016-04-09 ENCOUNTER — Encounter (INDEPENDENT_AMBULATORY_CARE_PROVIDER_SITE_OTHER): Payer: Self-pay

## 2016-04-09 ENCOUNTER — Encounter: Payer: Self-pay | Admitting: Emergency Medicine

## 2016-04-09 ENCOUNTER — Ambulatory Visit (INDEPENDENT_AMBULATORY_CARE_PROVIDER_SITE_OTHER): Payer: Medicare Other | Admitting: Emergency Medicine

## 2016-04-09 DIAGNOSIS — G4733 Obstructive sleep apnea (adult) (pediatric): Secondary | ICD-10-CM

## 2016-04-09 DIAGNOSIS — J449 Chronic obstructive pulmonary disease, unspecified: Secondary | ICD-10-CM

## 2016-04-09 DIAGNOSIS — J301 Allergic rhinitis due to pollen: Secondary | ICD-10-CM | POA: Diagnosis not present

## 2016-04-09 NOTE — Assessment & Plan Note (Signed)
Stable at this time. She had a recent bronchitis but this has cleared. We will continue Advair bid, SABA prn.

## 2016-04-09 NOTE — Assessment & Plan Note (Signed)
He is now willing to revisit CPAP for her sleep apnea. I will order a split night sleep study and follow up with her in 2 months.

## 2016-04-09 NOTE — Addendum Note (Signed)
Addended by: Desmond Dike C on: 04/09/2016 11:27 AM   Modules accepted: Orders

## 2016-04-09 NOTE — Progress Notes (Signed)
Subjective:    Patient ID: [name] Molina, female    DOB: 1946-04-01, 70 y.o.   MRN: [id number] HPI 70 yo woman, hx former tobacco and childhood asthma. Formerly followed by Dr Alva Garnet for mild COPD. Also hx GERD, HTN.  She was managed on Advair for years, but now that she is on Medicare she is looking for a less expensive option. She stopped the Advair in November.  Also, since September she has had much more trouble with nasal gtt, throat irritation, cough, throat clearing. More SABA use.   ROV 71/4/13 -- OSA/OHS, COPD, allergies and chronic cough.  Continued singulair, restarted allegra and added Qnasl. She is taking symbicort bid, needs to get financial assistance w this. Her nasal gtt is a lot better now, in fact her nose is dry on the Qnasl. Her breathing seems to be better also.   ROV 71/22/13 -- OSA/OHS, COPD, allergies and cough. We stopped Qnasl due to dryness, tried stopping Symbicort but needed to restart it. She has since run out and is ready to start Dulera 1 puff bid, possibly uptitrate to 2 puffs bid. She is on Nature conservation officer. No exacerbations since last time   ROV 71/20/13 -- OSA/OHS, COPD, allergies and cough.  She had been on Dulera due to cost, but now wants to go back to Advair - same cost. She believes she can stop the singulair if she is back on Advair. She is benefiting from [employer].    ROV 71/24/14 -- OSA/OHS, COPD, allergies and cough. Returns today for f/u - we changed Dulera back to Advair last visit (started 10/13). She had been doing well until about a week ago when she started having nasal gtt, some scratchy voice, occas cough. She stopped singulair.   ROV 71/27/14 -- OSA/OHS, COPD, allergies and cough.  She took singulair for a while last time, now is off of it. She is taking generic allegra. Her allergies are well controlled, cough is better. Uses albuterol rarely. No flares since last time.   ROV 71/16/15 -- OSA/OHS, COPD, allergies and cough. She had a URI in 9/'14  and was treated for an AE by Dr Gwenette Greet.  She is on Advair + albuterol prn, uses rarely. She is taking generic zyrtec right now (not fexofenadine). Not on singulair. No other flares. Has been active.   ROV 71/31/15 -- OSA/OHS, COPD, allergies and cough. She has frequent flares of predominantly UA disease > has been seen by Dr Elsworth Soho and by TP since last visit for probable influenza. Presents today with significant improvement. She had some wheeze, continues to have some nasal drainage. She has run out of loratadine, needs to restart. She still takes Advair, uses albuterol prn, about once a week.   ROV 71/6/15 -- OSA/OHS, COPD, allergies and cough, UA irritation.  She has been doing fairly well. She has been on Advair, uses albuterol prn - a few times a month. No flares since last time. Her diltiazem was recently increased.  She had the prevnar at Dr Orest Dikes office this year.   Acute OV 04/2014 OSA/OHS, COPD, allergies and cough, UA irritation. Complains of increased SOB, prod cough with green mucus, soreness/tightness in upper chest x4 days.   Denies any f/c/s, n/v/d, hemoptysis, chest pain, orthopnea.  Take mucinex without much help.   ROV 71/5/16 -- follow up for OSA/OHS, COPD, allergies and cough, UA irritation. She was hospitalized here in January and treated for probable PNA.  She injured her L knee in February.  She is on loratadine, singulair. Not on the nasal steroid right now. She is on advair bid. Doing well.   ROV 71/17/16 -- follow-up visit for COPD, allergic rhinitis, chronic cough and upper airway irritation. She also has obstructive sleep apnea/OHS. She is currently managed on loratadine qd, singulair during the allergy season. Takes fluticasone NS prn. She is on Advair bid. She uses albuterol rarely. She was treated for bronchitis with doxy last month. Flu shot up to date.  Coughs rarely. Exertional tolerance is poor, has to stop with mopping the floor. She has a CPAP but is not been using it  for 3 years. She feels only moderately rested in the am.   ROV 71/21/17 -- Patient has a history of COPD, allergic rhinitis, chronic cough and upper airway irritation syndrome. Also with obstructive sleep apnea not on CPAP.  She has been on flonase and montelukast during the allergy season with good results. Takes loratadine -D all year round.  Unfortunately since last time she suffered a fall, no significant injury. She is on Advair bid, takes reliably.  She was treated with azithro 1 week ago for a bronchitis. No prednisone. Flu shot up to date. Feels that her breathing is stable, does limit her some but more limited by hip and spinal pain.  Minimal SABA use.  Discussed her sleep apnea. She is willing to get his split-night sleep study and I will order this.    CAT Score 04/06/2015 06/04/2013 07/13/2012  Total CAT Score 18 14 13        Objective:   Physical Exam Vitals:   04/09/16 1105 04/09/16 1106  BP:  124/74  BP Location:  Left Arm  Cuff Size:  Normal  Pulse:  78  SpO2:  96%  Weight: 218 lb (98.9 kg)   Height: 5\' 3"  (1.6 m)    Gen: Pleasant,  in no distress,  normal affect  ENT: No lesions,  mouth clear,  oropharynx clear, no postnasal drip  Neck: No JVD, no TMG, no carotid bruits  Lungs: No use of accessory muscles,  clear without rales or rhonchi  Cardiovascular: RRR, heart sounds normal, no murmur or gallops, no peripheral edema  Musculoskeletal: No deformities, no cyanosis or clubbing  Neuro: alert, non focal  Skin: Warm, no lesions or rashes     Assessment & Plan:  COPD (chronic obstructive pulmonary disease) Stable at this time. She had a recent bronchitis but this has cleared. We will continue Advair bid, SABA prn.   Allergic rhinitis Loratadine every day. Flonase and Singulair during the allergy season. She is using these at this time successfully.  Obstructive sleep apnea He is now willing to revisit CPAP for her sleep apnea. I will order a split night  sleep study and follow up with her in 2 months.  Baltazar Apo, MD, PhD 04/09/2016, 11:23 AM [facility] [phone] or if no answer [phone]

## 2016-04-09 NOTE — Patient Instructions (Addendum)
We will continue your current medications as ordered.  Flu shot up to date. We will perform a split night sleep study.  Follow with Dr Lamonte Sakai in 2 months or sooner if you have any problems

## 2016-04-09 NOTE — Assessment & Plan Note (Signed)
Loratadine every day. Flonase and Singulair during the allergy season. She is using these at this time successfully.

## 2016-04-15 DIAGNOSIS — H2511 Age-related nuclear cataract, right eye: Secondary | ICD-10-CM | POA: Diagnosis not present

## 2016-04-15 DIAGNOSIS — H25011 Cortical age-related cataract, right eye: Secondary | ICD-10-CM | POA: Diagnosis not present

## 2016-04-23 DIAGNOSIS — H25011 Cortical age-related cataract, right eye: Secondary | ICD-10-CM | POA: Diagnosis not present

## 2016-04-23 DIAGNOSIS — H2511 Age-related nuclear cataract, right eye: Secondary | ICD-10-CM | POA: Diagnosis not present

## 2016-04-23 DIAGNOSIS — H25041 Posterior subcapsular polar age-related cataract, right eye: Secondary | ICD-10-CM | POA: Diagnosis not present

## 2016-04-23 DIAGNOSIS — H25811 Combined forms of age-related cataract, right eye: Secondary | ICD-10-CM | POA: Diagnosis not present

## 2016-04-30 ENCOUNTER — Encounter (INDEPENDENT_AMBULATORY_CARE_PROVIDER_SITE_OTHER): Payer: Medicare Other | Admitting: Ophthalmology

## 2016-04-30 DIAGNOSIS — H43813 Vitreous degeneration, bilateral: Secondary | ICD-10-CM

## 2016-04-30 DIAGNOSIS — H35033 Hypertensive retinopathy, bilateral: Secondary | ICD-10-CM | POA: Diagnosis not present

## 2016-04-30 DIAGNOSIS — I10 Essential (primary) hypertension: Secondary | ICD-10-CM

## 2016-04-30 DIAGNOSIS — H353112 Nonexudative age-related macular degeneration, right eye, intermediate dry stage: Secondary | ICD-10-CM | POA: Diagnosis not present

## 2016-04-30 DIAGNOSIS — H353221 Exudative age-related macular degeneration, left eye, with active choroidal neovascularization: Secondary | ICD-10-CM | POA: Diagnosis not present

## 2016-05-08 ENCOUNTER — Institutional Professional Consult (permissible substitution): Payer: Medicare Other | Admitting: Pulmonary Disease

## 2016-06-04 ENCOUNTER — Ambulatory Visit (HOSPITAL_BASED_OUTPATIENT_CLINIC_OR_DEPARTMENT_OTHER): Payer: Medicare Other

## 2016-06-10 ENCOUNTER — Encounter: Payer: Self-pay | Admitting: Emergency Medicine

## 2016-06-10 ENCOUNTER — Ambulatory Visit (INDEPENDENT_AMBULATORY_CARE_PROVIDER_SITE_OTHER): Payer: Medicare Other | Admitting: Emergency Medicine

## 2016-06-10 DIAGNOSIS — G4733 Obstructive sleep apnea (adult) (pediatric): Secondary | ICD-10-CM

## 2016-06-10 DIAGNOSIS — J449 Chronic obstructive pulmonary disease, unspecified: Secondary | ICD-10-CM

## 2016-06-10 NOTE — Assessment & Plan Note (Signed)
Her sleep study was postponed. It's rescheduled for mid February. I will follow-up with her in early March to review the study and make plans for treatment

## 2016-06-10 NOTE — Patient Instructions (Addendum)
We will follow up after your sleep study Feb14, goal in Early March 2018.  Please continue your medications as you have been taking them

## 2016-06-10 NOTE — Progress Notes (Signed)
Subjective:    Patient ID: [name] Molina, female    DOB: 12-02-1945, 71 y.o.   MRN: [id number] HPI 71 yo woman, hx former tobacco and childhood asthma. Formerly followed by Dr Alva Garnet for mild COPD. Also hx GERD, HTN.  She was managed on Advair for years, but now that she is on Medicare she is looking for a less expensive option. She stopped the Advair in November.  Also, since September she has had much more trouble with nasal gtt, throat irritation, cough, throat clearing. More SABA use.   ROV 07/22/11 -- OSA/OHS, COPD, allergies and chronic cough.  Continued singulair, restarted allegra and added Qnasl. She is taking symbicort bid, needs to get financial assistance w this. Her nasal gtt is a lot better now, in fact her nose is dry on the Qnasl. Her breathing seems to be better also.   ROV 09/09/11 -- OSA/OHS, COPD, allergies and cough. We stopped Qnasl due to dryness, tried stopping Symbicort but needed to restart it. She has since run out and is ready to start Dulera 1 puff bid, possibly uptitrate to 2 puffs bid. She is on Nature conservation officer. No exacerbations since last time   ROV 01/07/12 -- OSA/OHS, COPD, allergies and cough.  She had been on Dulera due to cost, but now wants to go back to Advair - same cost. She believes she can stop the singulair if she is back on Advair. She is benefiting from [employer].    ROV 07/13/12 -- OSA/OHS, COPD, allergies and cough. Returns today for f/u - we changed Dulera back to Advair last visit (started 10/13). She had been doing well until about a week ago when she started having nasal gtt, some scratchy voice, occas cough. She stopped singulair.   ROV 11/13/12 -- OSA/OHS, COPD, allergies and cough.  She took singulair for a while last time, now is off of it. She is taking generic allegra. Her allergies are well controlled, cough is better. Uses albuterol rarely. No flares since last time.   ROV 06/04/13 -- OSA/OHS, COPD, allergies and cough. She had a URI in 9/'14  and was treated for an AE by Dr Gwenette Greet.  She is on Advair + albuterol prn, uses rarely. She is taking generic zyrtec right now (not fexofenadine). Not on singulair. No other flares. Has been active.   ROV 08/17/13 -- OSA/OHS, COPD, allergies and cough. She has frequent flares of predominantly UA disease > has been seen by Dr Elsworth Soho and by TP since last visit for probable influenza. Presents today with significant improvement. She had some wheeze, continues to have some nasal drainage. She has run out of loratadine, needs to restart. She still takes Advair, uses albuterol prn, about once a week.   ROV 12/23/13 -- OSA/OHS, COPD, allergies and cough, UA irritation.  She has been doing fairly well. She has been on Advair, uses albuterol prn - a few times a month. No flares since last time. Her diltiazem was recently increased.  She had the prevnar at Dr Orest Dikes office this year.   Acute OV 04/2014 OSA/OHS, COPD, allergies and cough, UA irritation. Complains of increased SOB, prod cough with green mucus, soreness/tightness in upper chest x4 days.   Denies any f/c/s, n/v/d, hemoptysis, chest pain, orthopnea.  Take mucinex without much help.   ROV 08/23/14 -- follow up for OSA/OHS, COPD, allergies and cough, UA irritation. She was hospitalized here in January and treated for probable PNA.  She injured her L knee in February.  She is on loratadine, singulair. Not on the nasal steroid right now. She is on advair bid. Doing well.   ROV 04/06/15 -- follow-up visit for COPD, allergic rhinitis, chronic cough and upper airway irritation. She also has obstructive sleep apnea/OHS. She is currently managed on loratadine qd, singulair during the allergy season. Takes fluticasone NS prn. She is on Advair bid. She uses albuterol rarely. She was treated for bronchitis with doxy last month. Flu shot up to date.  Coughs rarely. Exertional tolerance is poor, has to stop with mopping the floor. She has a CPAP but is not been using it  for 3 years. She feels only moderately rested in the am.   ROV 04/09/16 -- Patient has a history of COPD, allergic rhinitis, chronic cough and upper airway irritation syndrome. Also with obstructive sleep apnea not on CPAP.  She has been on flonase and montelukast during the allergy season with good results. Takes loratadine -D all year round.  Unfortunately since last time she suffered a fall, no significant injury. She is on Advair bid, takes reliably.  She was treated with azithro 1 week ago for a bronchitis. No prednisone. Flu shot up to date. Feels that her breathing is stable, does limit her some but more limited by hip and spinal pain.  Minimal SABA use.  Discussed her sleep apnea. She is willing to get his split-night sleep study and I will order this.  ROV 06/10/16 --     CAT Score 04/06/2015 06/04/2013 07/13/2012  Total CAT Score 18 14 13        Objective:   Physical Exam Vitals:   06/10/16 1003  BP: 124/70  BP Location: Right Arm  Cuff Size: Normal  Pulse: 96  SpO2: 96%  Weight: 218 lb 12.8 oz (99.2 kg)  Height: 5\' 3"  (1.6 m)   Gen: Pleasant,  in no distress,  normal affect  ENT: No lesions,  mouth clear,  oropharynx clear, no postnasal drip  Neck: No JVD, no TMG, no carotid bruits  Lungs: No use of accessory muscles,  clear without rales or rhonchi  Cardiovascular: RRR, heart sounds normal, no murmur or gallops, no peripheral edema  Musculoskeletal: No deformities, no cyanosis or clubbing  Neuro: alert, non focal  Skin: Warm, no lesions or rashes     Assessment & Plan:  Obstructive sleep apnea Her sleep study was postponed. It's rescheduled for mid February. I will follow-up with her in early March to review the study and make plans for treatment  COPD (chronic obstructive pulmonary disease) Continue Advair, albuterol as needed.  Baltazar Apo, MD, PhD 06/10/2016, 10:20 AM [facility] [phone] or if no answer [phone]

## 2016-06-10 NOTE — Assessment & Plan Note (Signed)
Continue Advair, albuterol as needed.

## 2016-06-14 ENCOUNTER — Ambulatory Visit (HOSPITAL_BASED_OUTPATIENT_CLINIC_OR_DEPARTMENT_OTHER): Payer: Medicare Other | Attending: Emergency Medicine | Admitting: Pulmonary Disease

## 2016-06-14 DIAGNOSIS — Z79899 Other long term (current) drug therapy: Secondary | ICD-10-CM | POA: Diagnosis not present

## 2016-06-14 DIAGNOSIS — Z7982 Long term (current) use of aspirin: Secondary | ICD-10-CM | POA: Insufficient documentation

## 2016-06-14 DIAGNOSIS — G4761 Periodic limb movement disorder: Secondary | ICD-10-CM | POA: Diagnosis not present

## 2016-06-14 DIAGNOSIS — G4733 Obstructive sleep apnea (adult) (pediatric): Secondary | ICD-10-CM | POA: Diagnosis not present

## 2016-06-17 DIAGNOSIS — R0683 Snoring: Secondary | ICD-10-CM | POA: Diagnosis not present

## 2016-06-17 NOTE — Procedures (Signed)
Patient Name: [name] Molina, [name] Molina Date: 06/14/2016 Gender: Female D.O.B: November 01, 1945 Age (years): 71 Referring Provider: Baltazar Apo Height (inches): 63 Interpreting Physician: Kara Mead MD, ABSM Weight (lbs): 219 RPSGT: Earney [place] BMI: 39 MRN: [id number] Neck Size: 15.50   CLINICAL INFORMATION Sleep Study Type: NPSG  Indication for sleep study: OSA  Epworth Sleepiness Score: 10  SLEEP STUDY TECHNIQUE As per the AASM Manual for the Scoring of Sleep and Associated Events v2.3 (April 2016) with a hypopnea requiring 4% desaturations.  The channels recorded and monitored were frontal, central and occipital EEG, electrooculogram (EOG), submentalis EMG (chin), nasal and oral airflow, thoracic and abdominal wall motion, anterior tibialis EMG, snore microphone, electrocardiogram, and pulse oximetry.  MEDICATIONS Medications self-administered by patient taken the night of the study : ASPIRIN, SINGULAIR, OCUVITE PO, PRILOSEC, PRAVASTATIN, SONATA  SLEEP ARCHITECTURE The study was initiated at 9:38:25 PM and ended at 4:30:06 AM.  Sleep onset time was 38.9 minutes and the sleep efficiency was 62.5%. The total sleep time was 257.3 minutes.  Stage REM latency was 61.0 minutes.  The patient spent 5.83% of the night in stage N1 sleep, 90.67% in stage N2 sleep, 0.00% in stage N3 and 3.50% in REM.  Alpha intrusion was absent.  Supine sleep was 42.56%.  RESPIRATORY PARAMETERS The overall apnea/hypopnea index (AHI) was 2.1 per hour. There were 0 total apneas, including 0 obstructive, 0 central and 0 mixed apneas. There were 9 hypopneas and 17 RERAs.  The AHI during Stage REM sleep was 6.7 per hour.  AHI while supine was 4.4 per hour.  The mean oxygen saturation was 90.90%. The minimum SpO2 during sleep was 87.00%.  Loud snoring was noted during this study.  CARDIAC DATA The 2 lead EKG demonstrated sinus rhythm. The mean heart rate was 76.63 beats per minute. Other EKG  findings include: None.  LEG MOVEMENT DATA The total PLMS were 44 with a resulting PLMS index of 10.26. Associated arousal with leg movement index was 1.9 .  IMPRESSIONS - No significant obstructive sleep apnea occurred during this study (AHI = 2.1/h). - No significant central sleep apnea occurred during this study (CAI = 0.0/h). - Mild oxygen desaturation was noted during this study (Min O2 = 87.00%). - The patient snored with Loud snoring volume. - No cardiac abnormalities were noted during this study. - Mild periodic limb movements of sleep occurred during the study. No significant associated arousals.   DIAGNOSIS - No evidence of significant OSA   RECOMMENDATIONS - Avoid alcohol, sedatives and other CNS depressants that may worsen sleep apnea and disrupt normal sleep architecture. - Sleep hygiene should be reviewed to assess factors that may improve sleep quality. - Weight management and regular exercise should be initiated or continued if appropriate.   Kara Mead MD Board Certified in [place]

## 2016-07-03 ENCOUNTER — Encounter (HOSPITAL_BASED_OUTPATIENT_CLINIC_OR_DEPARTMENT_OTHER): Payer: Medicare Other

## 2016-07-19 ENCOUNTER — Encounter: Payer: Self-pay | Admitting: Emergency Medicine

## 2016-07-19 ENCOUNTER — Encounter (INDEPENDENT_AMBULATORY_CARE_PROVIDER_SITE_OTHER): Payer: Self-pay

## 2016-07-19 ENCOUNTER — Ambulatory Visit (INDEPENDENT_AMBULATORY_CARE_PROVIDER_SITE_OTHER): Payer: Medicare Other | Admitting: Emergency Medicine

## 2016-07-19 DIAGNOSIS — G4733 Obstructive sleep apnea (adult) (pediatric): Secondary | ICD-10-CM | POA: Diagnosis not present

## 2016-07-19 MED ORDER — FLUTICASONE PROPIONATE 50 MCG/ACT NA SUSP: 1.0000 | Freq: Every day | NASAL | 5 refills | Status: AC

## 2016-07-19 NOTE — Assessment & Plan Note (Signed)
No evidence for clinically significant OSA on her most recent PSG. No indication to treat. She did have mild PLMD, again likely no indication to treat

## 2016-07-19 NOTE — Assessment & Plan Note (Signed)
We will continue your Advair twice a day. Please rinse and gargle after using.  Keep albuterol available to use as needed for shortness of breath Flu shot up to date.  Follow with Dr Lamonte Sakai in 4 months or sooner if you have any problems.

## 2016-07-19 NOTE — Progress Notes (Signed)
  Subjective:    Patient ID: [name] Molina, female    DOB: [birth year], 71 y.o.   MRN: [id number] HPI  ROV 07/19/16 --  This follow-up visit for patient with a history of allergic rhinitis, chronic cough and obstructive lung disease in setting childhood asthma dn hx tobacco use. She underwent a PSG 06/25/16 that did not show any clinically significant OSA, did show mild PLMD. She has bee managed on Advair. Also on flonase, singulair, loratadine. She reports that her breathing has been OK, but she did not have any Advair for January due to cost. She is also off of her nasal steroid right now. Uses albuterol several times a week, more over the last week.    No flowsheet data found.     Objective:   Physical Exam Vitals:   07/19/16 1036  BP: 130/88  BP Location: Left Arm  Patient Position: Sitting  Cuff Size: Large  Pulse: (!) 101  SpO2: 96%  Weight: 218 lb 6.4 oz (99.1 kg)  Height: 5\' 3"  (1.6 m)   Gen: Pleasant,  in no distress,  normal affect  ENT: No lesions,  mouth clear,  oropharynx clear, no postnasal drip  Neck: No JVD, no TMG, no carotid bruits  Lungs: No use of accessory muscles,  clear without rales or rhonchi  Cardiovascular: RRR, heart sounds normal, no murmur or gallops, no peripheral edema  Musculoskeletal: No deformities, no cyanosis or clubbing  Neuro: alert, non focal  Skin: Warm, no lesions or rashes     Assessment & Plan:  COPD (chronic obstructive pulmonary disease) We will continue your Advair twice a day. Please rinse and gargle after using.  Keep albuterol available to use as needed for shortness of breath Flu shot up to date.  Follow with Dr Lamonte Sakai in 4 months or sooner if you have any problems.  Allergic rhinitis Continue your singulair and loratadine (Claritin) Restart your Fluticasone nasal spray, 2 sprays each side daily.   Obstructive sleep apnea No evidence for clinically significant OSA on her most recent PSG. No indication to treat. She did  have mild PLMD, again likely no indication to treat  Baltazar Apo, MD, PhD 07/19/2016, 10:57 AM [facility] [phone] or if no answer [phone]

## 2016-07-19 NOTE — Patient Instructions (Addendum)
We will continue your Advair twice a day. Please rinse and gargle after using.  Keep albuterol available to use as needed for shortness of breath Flu shot up to date.  Continue your singulair and loratadine (Claritin) Restart your Fluticasone nasal spray, 2 sprays each side daily.  Follow with Dr Lamonte Sakai in 4 months or sooner if you have any problems.

## 2016-07-19 NOTE — Assessment & Plan Note (Signed)
Continue your singulair and loratadine (Claritin) Restart your Fluticasone nasal spray, 2 sprays each side daily.

## 2016-07-23 ENCOUNTER — Encounter (INDEPENDENT_AMBULATORY_CARE_PROVIDER_SITE_OTHER): Payer: Medicare Other | Admitting: Ophthalmology

## 2016-07-23 DIAGNOSIS — H353221 Exudative age-related macular degeneration, left eye, with active choroidal neovascularization: Secondary | ICD-10-CM | POA: Diagnosis not present

## 2016-07-23 DIAGNOSIS — I10 Essential (primary) hypertension: Secondary | ICD-10-CM

## 2016-07-23 DIAGNOSIS — H353112 Nonexudative age-related macular degeneration, right eye, intermediate dry stage: Secondary | ICD-10-CM | POA: Diagnosis not present

## 2016-07-23 DIAGNOSIS — H35033 Hypertensive retinopathy, bilateral: Secondary | ICD-10-CM | POA: Diagnosis not present

## 2016-07-23 DIAGNOSIS — H43813 Vitreous degeneration, bilateral: Secondary | ICD-10-CM

## 2016-08-08 DIAGNOSIS — M25571 Pain in right ankle and joints of right foot: Secondary | ICD-10-CM | POA: Diagnosis not present

## 2016-08-14 DIAGNOSIS — S92044A Nondisplaced other fracture of tuberosity of right calcaneus, initial encounter for closed fracture: Secondary | ICD-10-CM | POA: Diagnosis not present

## 2016-08-21 DIAGNOSIS — S92044D Nondisplaced other fracture of tuberosity of right calcaneus, subsequent encounter for fracture with routine healing: Secondary | ICD-10-CM | POA: Diagnosis not present

## 2016-09-18 DIAGNOSIS — S92044D Nondisplaced other fracture of tuberosity of right calcaneus, subsequent encounter for fracture with routine healing: Secondary | ICD-10-CM | POA: Diagnosis not present

## 2016-10-09 DIAGNOSIS — S92044D Nondisplaced other fracture of tuberosity of right calcaneus, subsequent encounter for fracture with routine healing: Secondary | ICD-10-CM | POA: Diagnosis not present

## 2016-10-15 ENCOUNTER — Encounter (INDEPENDENT_AMBULATORY_CARE_PROVIDER_SITE_OTHER): Payer: Medicare Other | Admitting: Ophthalmology

## 2016-10-15 DIAGNOSIS — H43813 Vitreous degeneration, bilateral: Secondary | ICD-10-CM

## 2016-10-15 DIAGNOSIS — I10 Essential (primary) hypertension: Secondary | ICD-10-CM

## 2016-10-15 DIAGNOSIS — H353112 Nonexudative age-related macular degeneration, right eye, intermediate dry stage: Secondary | ICD-10-CM | POA: Diagnosis not present

## 2016-10-15 DIAGNOSIS — H353221 Exudative age-related macular degeneration, left eye, with active choroidal neovascularization: Secondary | ICD-10-CM | POA: Diagnosis not present

## 2016-10-15 DIAGNOSIS — H35033 Hypertensive retinopathy, bilateral: Secondary | ICD-10-CM | POA: Diagnosis not present

## 2016-10-24 DIAGNOSIS — M25674 Stiffness of right foot, not elsewhere classified: Secondary | ICD-10-CM | POA: Diagnosis not present

## 2016-10-24 DIAGNOSIS — S92811D Other fracture of right foot, subsequent encounter for fracture with routine healing: Secondary | ICD-10-CM | POA: Diagnosis not present

## 2016-10-24 DIAGNOSIS — M79671 Pain in right foot: Secondary | ICD-10-CM | POA: Diagnosis not present

## 2016-10-24 DIAGNOSIS — R262 Difficulty in walking, not elsewhere classified: Secondary | ICD-10-CM | POA: Diagnosis not present

## 2016-10-29 DIAGNOSIS — M25674 Stiffness of right foot, not elsewhere classified: Secondary | ICD-10-CM | POA: Diagnosis not present

## 2016-10-29 DIAGNOSIS — M79671 Pain in right foot: Secondary | ICD-10-CM | POA: Diagnosis not present

## 2016-10-29 DIAGNOSIS — S92811D Other fracture of right foot, subsequent encounter for fracture with routine healing: Secondary | ICD-10-CM | POA: Diagnosis not present

## 2016-10-29 DIAGNOSIS — R262 Difficulty in walking, not elsewhere classified: Secondary | ICD-10-CM | POA: Diagnosis not present

## 2016-11-05 DIAGNOSIS — R262 Difficulty in walking, not elsewhere classified: Secondary | ICD-10-CM | POA: Diagnosis not present

## 2016-11-05 DIAGNOSIS — S92811D Other fracture of right foot, subsequent encounter for fracture with routine healing: Secondary | ICD-10-CM | POA: Diagnosis not present

## 2016-11-05 DIAGNOSIS — M25674 Stiffness of right foot, not elsewhere classified: Secondary | ICD-10-CM | POA: Diagnosis not present

## 2016-11-05 DIAGNOSIS — M79671 Pain in right foot: Secondary | ICD-10-CM | POA: Diagnosis not present

## 2016-11-07 DIAGNOSIS — R262 Difficulty in walking, not elsewhere classified: Secondary | ICD-10-CM | POA: Diagnosis not present

## 2016-11-07 DIAGNOSIS — S92811D Other fracture of right foot, subsequent encounter for fracture with routine healing: Secondary | ICD-10-CM | POA: Diagnosis not present

## 2016-11-07 DIAGNOSIS — M25674 Stiffness of right foot, not elsewhere classified: Secondary | ICD-10-CM | POA: Diagnosis not present

## 2016-11-07 DIAGNOSIS — M79671 Pain in right foot: Secondary | ICD-10-CM | POA: Diagnosis not present

## 2016-11-12 DIAGNOSIS — M79671 Pain in right foot: Secondary | ICD-10-CM | POA: Diagnosis not present

## 2016-11-12 DIAGNOSIS — M25674 Stiffness of right foot, not elsewhere classified: Secondary | ICD-10-CM | POA: Diagnosis not present

## 2016-11-12 DIAGNOSIS — S92811D Other fracture of right foot, subsequent encounter for fracture with routine healing: Secondary | ICD-10-CM | POA: Diagnosis not present

## 2016-11-12 DIAGNOSIS — R262 Difficulty in walking, not elsewhere classified: Secondary | ICD-10-CM | POA: Diagnosis not present

## 2016-11-14 DIAGNOSIS — M25674 Stiffness of right foot, not elsewhere classified: Secondary | ICD-10-CM | POA: Diagnosis not present

## 2016-11-14 DIAGNOSIS — R262 Difficulty in walking, not elsewhere classified: Secondary | ICD-10-CM | POA: Diagnosis not present

## 2016-11-14 DIAGNOSIS — M79671 Pain in right foot: Secondary | ICD-10-CM | POA: Diagnosis not present

## 2016-11-14 DIAGNOSIS — S92811D Other fracture of right foot, subsequent encounter for fracture with routine healing: Secondary | ICD-10-CM | POA: Diagnosis not present

## 2016-11-15 DIAGNOSIS — M25674 Stiffness of right foot, not elsewhere classified: Secondary | ICD-10-CM | POA: Diagnosis not present

## 2016-11-19 DIAGNOSIS — M19042 Primary osteoarthritis, left hand: Secondary | ICD-10-CM | POA: Diagnosis not present

## 2016-11-19 DIAGNOSIS — S92811D Other fracture of right foot, subsequent encounter for fracture with routine healing: Secondary | ICD-10-CM | POA: Diagnosis not present

## 2016-11-19 DIAGNOSIS — F419 Anxiety disorder, unspecified: Secondary | ICD-10-CM | POA: Diagnosis not present

## 2016-11-19 DIAGNOSIS — M25674 Stiffness of right foot, not elsewhere classified: Secondary | ICD-10-CM | POA: Diagnosis not present

## 2016-11-19 DIAGNOSIS — R262 Difficulty in walking, not elsewhere classified: Secondary | ICD-10-CM | POA: Diagnosis not present

## 2016-11-19 DIAGNOSIS — M79671 Pain in right foot: Secondary | ICD-10-CM | POA: Diagnosis not present

## 2016-11-19 DIAGNOSIS — F329 Major depressive disorder, single episode, unspecified: Secondary | ICD-10-CM | POA: Diagnosis not present

## 2016-11-19 DIAGNOSIS — M19041 Primary osteoarthritis, right hand: Secondary | ICD-10-CM | POA: Diagnosis not present

## 2016-11-19 DIAGNOSIS — I10 Essential (primary) hypertension: Secondary | ICD-10-CM | POA: Diagnosis not present

## 2016-11-19 DIAGNOSIS — L989 Disorder of the skin and subcutaneous tissue, unspecified: Secondary | ICD-10-CM | POA: Diagnosis not present

## 2016-11-19 DIAGNOSIS — E785 Hyperlipidemia, unspecified: Secondary | ICD-10-CM | POA: Diagnosis not present

## 2016-11-21 DIAGNOSIS — M79671 Pain in right foot: Secondary | ICD-10-CM | POA: Diagnosis not present

## 2016-11-21 DIAGNOSIS — R262 Difficulty in walking, not elsewhere classified: Secondary | ICD-10-CM | POA: Diagnosis not present

## 2016-11-21 DIAGNOSIS — M25674 Stiffness of right foot, not elsewhere classified: Secondary | ICD-10-CM | POA: Diagnosis not present

## 2016-11-21 DIAGNOSIS — S92811D Other fracture of right foot, subsequent encounter for fracture with routine healing: Secondary | ICD-10-CM | POA: Diagnosis not present

## 2016-12-09 ENCOUNTER — Ambulatory Visit (INDEPENDENT_AMBULATORY_CARE_PROVIDER_SITE_OTHER): Payer: Medicare Other | Admitting: Emergency Medicine

## 2016-12-09 ENCOUNTER — Encounter: Payer: Self-pay | Admitting: Emergency Medicine

## 2016-12-09 DIAGNOSIS — J449 Chronic obstructive pulmonary disease, unspecified: Secondary | ICD-10-CM | POA: Diagnosis not present

## 2016-12-09 DIAGNOSIS — J301 Allergic rhinitis due to pollen: Secondary | ICD-10-CM | POA: Diagnosis not present

## 2016-12-09 NOTE — Progress Notes (Signed)
  Subjective:    Patient ID: [name] Molina, female    DOB: 1945/08/09, 71 y.o.   MRN: [id number] HPI  ROV 07/19/16 --  This follow-up visit for patient with a history of allergic rhinitis, chronic cough and obstructive lung disease in setting childhood asthma dn hx tobacco use. She underwent a PSG 06/25/16 that did not show any clinically significant OSA, did show mild PLMD. She has bee managed on Advair. Also on flonase, singulair, loratadine. She reports that her breathing has been OK, but she did not have any Advair for January due to cost. She is also off of her nasal steroid right now. Uses albuterol several times a week, more over the last week.   ROV 12/09/16 -- this follow-up visit for patient with a history of obstructive lung disease (childhood asthma, history tobacco and COPD). Also with a history of allergic rhinitis and chronic cough. Since last time she fx her R heel - had to go through rehab. She feels that she is breathing fairly well - she is able to exert, walks w a cane or walker. Able to shop with a cart. Her breathing is stablke since last time. She remains on Advair, prefers it to alternatives. She is off flonase and singulair until Fall, will restart then. She is on loratadine daily. She remains on omeprazole. Uses albuterol very rarely. No flares since last time.    No flowsheet data found.     Objective:   Physical Exam Vitals:   12/09/16 1010  BP: 120/78  Pulse: 94  SpO2: 92%  Weight: 228 lb 12.8 oz (103.8 kg)  Height: 5\' 2"  (1.575 m)   Gen: Pleasant,  in no distress,  normal affect  ENT: No lesions,  mouth clear,  oropharynx clear, no postnasal drip  Neck: No JVD, no TMG, no carotid bruits  Lungs: No use of accessory muscles,  clear without rales or rhonchi  Cardiovascular: RRR, heart sounds normal, no murmur or gallops, no peripheral edema  Musculoskeletal: No deformities, no cyanosis or clubbing  Neuro: alert, non focal  Skin: Warm, no lesions or rashes    Assessment & Plan:  Allergic rhinitis Restart fluticasone nasal spray, Singulair is fall at the beginning of allergy season.  COPD (chronic obstructive pulmonary disease) Stable at this time. No exacerbations. Stable exertional tolerance. She is on Advair, has been on this for many years and prefers it to alternatives. Continue  this regimen.   Baltazar Apo, MD, PhD 12/09/2016, 10:28 AM [facility] [phone] or if no answer [phone]

## 2016-12-09 NOTE — Assessment & Plan Note (Signed)
Stable at this time. No exacerbations. Stable exertional tolerance. She is on Advair, has been on this for many years and prefers it to alternatives. Continue  this regimen.

## 2016-12-09 NOTE — Assessment & Plan Note (Signed)
Restart fluticasone nasal spray, Singulair is fall at the beginning of allergy season.

## 2016-12-09 NOTE — Patient Instructions (Signed)
Please continue your current medications as you are taking them Restart fluticasone nasal spray, Singulair is fall at the beginning of allergy season. Follow with Dr Lamonte Sakai in 6 months or sooner if you have any problems

## 2017-01-21 ENCOUNTER — Encounter (INDEPENDENT_AMBULATORY_CARE_PROVIDER_SITE_OTHER): Payer: Medicare Other | Admitting: Ophthalmology

## 2017-01-21 DIAGNOSIS — H353112 Nonexudative age-related macular degeneration, right eye, intermediate dry stage: Secondary | ICD-10-CM | POA: Diagnosis not present

## 2017-01-21 DIAGNOSIS — H43813 Vitreous degeneration, bilateral: Secondary | ICD-10-CM

## 2017-01-21 DIAGNOSIS — H353221 Exudative age-related macular degeneration, left eye, with active choroidal neovascularization: Secondary | ICD-10-CM | POA: Diagnosis not present

## 2017-01-21 DIAGNOSIS — H35033 Hypertensive retinopathy, bilateral: Secondary | ICD-10-CM

## 2017-01-21 DIAGNOSIS — I10 Essential (primary) hypertension: Secondary | ICD-10-CM

## 2017-01-27 DIAGNOSIS — N390 Urinary tract infection, site not specified: Secondary | ICD-10-CM | POA: Diagnosis not present

## 2017-01-27 DIAGNOSIS — Z23 Encounter for immunization: Secondary | ICD-10-CM | POA: Diagnosis not present

## 2017-01-31 ENCOUNTER — Encounter (HOSPITAL_BASED_OUTPATIENT_CLINIC_OR_DEPARTMENT_OTHER): Payer: Self-pay

## 2017-01-31 ENCOUNTER — Emergency Department (HOSPITAL_BASED_OUTPATIENT_CLINIC_OR_DEPARTMENT_OTHER)
Admission: EM | Admit: 2017-01-31 | Discharge: 2017-01-31 | Disposition: A | Discharge status: Home / Self Care | Payer: Medicare Other | Attending: Emergency Medicine | Admitting: Emergency Medicine

## 2017-01-31 ENCOUNTER — Emergency Department (HOSPITAL_BASED_OUTPATIENT_CLINIC_OR_DEPARTMENT_OTHER): Payer: Medicare Other

## 2017-01-31 DIAGNOSIS — W08XXXA Fall from other furniture, initial encounter: Secondary | ICD-10-CM | POA: Insufficient documentation

## 2017-01-31 DIAGNOSIS — M25551 Pain in right hip: Secondary | ICD-10-CM | POA: Diagnosis not present

## 2017-01-31 DIAGNOSIS — Z87891 Personal history of nicotine dependence: Secondary | ICD-10-CM | POA: Insufficient documentation

## 2017-01-31 DIAGNOSIS — J45909 Unspecified asthma, uncomplicated: Secondary | ICD-10-CM | POA: Insufficient documentation

## 2017-01-31 DIAGNOSIS — Y999 Unspecified external cause status: Secondary | ICD-10-CM | POA: Insufficient documentation

## 2017-01-31 DIAGNOSIS — Z7982 Long term (current) use of aspirin: Secondary | ICD-10-CM | POA: Diagnosis not present

## 2017-01-31 DIAGNOSIS — J449 Chronic obstructive pulmonary disease, unspecified: Secondary | ICD-10-CM | POA: Diagnosis not present

## 2017-01-31 DIAGNOSIS — S92411A Displaced fracture of proximal phalanx of right great toe, initial encounter for closed fracture: Secondary | ICD-10-CM | POA: Diagnosis not present

## 2017-01-31 DIAGNOSIS — S79911A Unspecified injury of right hip, initial encounter: Secondary | ICD-10-CM | POA: Diagnosis not present

## 2017-01-31 DIAGNOSIS — Z79899 Other long term (current) drug therapy: Secondary | ICD-10-CM | POA: Diagnosis not present

## 2017-01-31 DIAGNOSIS — I10 Essential (primary) hypertension: Secondary | ICD-10-CM | POA: Diagnosis not present

## 2017-01-31 DIAGNOSIS — Y939 Activity, unspecified: Secondary | ICD-10-CM | POA: Insufficient documentation

## 2017-01-31 DIAGNOSIS — S92911A Unspecified fracture of right toe(s), initial encounter for closed fracture: Secondary | ICD-10-CM

## 2017-01-31 DIAGNOSIS — S92531A Displaced fracture of distal phalanx of right lesser toe(s), initial encounter for closed fracture: Secondary | ICD-10-CM | POA: Diagnosis not present

## 2017-01-31 DIAGNOSIS — Y929 Unspecified place or not applicable: Secondary | ICD-10-CM | POA: Insufficient documentation

## 2017-01-31 DIAGNOSIS — S99821A Other specified injuries of right foot, initial encounter: Secondary | ICD-10-CM | POA: Diagnosis present

## 2017-01-31 NOTE — ED Provider Notes (Signed)
[facility] DEPT [facility] Provider Note   CSN: [id number] Arrival date & time: 01/31/17  1808  History   Chief Complaint Chief Complaint  Patient presents with  . Fall    HPI [name] Molina is a 71 y.o. female presenting after mechanical fall at home. She stubbed her left toes on couch and fell back on to her right shoulder. She was trying to leverage herself to upright position and injured her right toes in doing so- unsure if they bent back or curled under her weight. Her right foot has bruising on the outside part after this ordeal. She did not hit her head. Her right shoulder and neck feel sore from the fall. She does endorse some right hip discomfort, states this is possibly due to being in uncomfortable bed however wants to make sure she didn't break her hip as well. Daughter at bedside.    HPI  Past Medical History:  Diagnosis Date  . Anxiety   . Asthma   . Atrial tachycardia ([place])   . Cataract   . COPD (chronic obstructive pulmonary disease) ([place])   . Diverticulosis   . GERD (gastroesophageal reflux disease)   . Hyperlipidemia   . Hypertension   . Insomnia   . Macular degeneration   . Migraines   . Neuropathy     Patient Active Problem List   Diagnosis Date Noted  . RUQ abdominal pain 11/27/2015  . Suprapubic pain 11/27/2015  . Dysphagia 11/27/2015  . Abdominal mass, RUQ (right upper quadrant) 11/27/2015  . Obstructive sleep apnea 04/06/2015  . PAT (paroxysmal atrial tachycardia) ([place]) 08/09/2014  . GERD (gastroesophageal reflux disease)   . Hyperlipidemia   . Spinal stenosis of lumbar region 03/16/2014  . Idiopathic peripheral neuropathy 03/16/2014  . Hypertension 06/13/2011  . Allergic rhinitis 06/13/2011  . Chronic headaches 06/13/2011  . Emphysema 06/13/2011  . COPD (chronic obstructive pulmonary disease) ([place]) 04/25/2007    Past Surgical History:  Procedure Laterality Date  . CERVICAL POLYPECTOMY     INSIDE AND OUTSIDE  . COLONOSCOPY    .  POLYPECTOMY    . REMOVED CARTLIAGE RIB      OB History    No data available       Home Medications    Prior to Admission medications   Medication Sig Start Date End Date Taking? Authorizing Provider  Sulfamethoxazole-Trimethoprim (BACTRIM PO) Take by mouth.   Yes [provider]  acetaminophen (TYLENOL) 500 MG tablet Take 1,000 mg by mouth every 6 (six) hours as needed for moderate pain or headache.    [provider]  albuterol (PROVENTIL HFA;VENTOLIN HFA) 108 (90 BASE) MCG/ACT inhaler Inhale 1 puff into the lungs every 6 (six) hours as needed. Shortness of breath 06/04/13   Collene Gobble, MD  aspirin 81 MG tablet Take 81 mg by mouth daily.      [provider]  aspirin-acetaminophen-caffeine (EXCEDRIN MIGRAINE) [phone] MG per tablet Take 2 tablets by mouth every 6 (six) hours as needed for headache.    [provider]  BESIVANCE 0.6 % SUSP Place 1 drop into the left eye 4 (four) times daily.  09/07/14   [provider]  CARTIA XT 240 MG 24 hr capsule TAKE ONE CAPSULE BY MOUTH DAILY 03/19/16   Jerline Pain, MD  clobetasol cream (TEMOVATE) 7.37 % Apply 1 application topically daily as needed (yeast.).  11/01/13   [provider]  FLUoxetine (PROZAC) 20 MG capsule Take 40 mg by mouth Daily.  10/23/10   [provider]  FLUoxetine (PROZAC) 40 MG capsule Take 40 mg by mouth every morning. 11/19/16   [provider]  fluticasone (FLONASE) 50 MCG/ACT nasal spray Place 1 spray into both nostrils daily. 07/19/16   Collene Gobble, MD  Fluticasone-Salmeterol (ADVAIR DISKUS) 250-50 MCG/DOSE AEPB Inhale 1 puff into the lungs 2 (two) times daily. 01/07/12   Collene Gobble, MD  ibuprofen (ADVIL,MOTRIN) 200 MG tablet Take 400 mg by mouth every 6 (six) hours as needed for headache or moderate pain.    [provider]  lactase (LACTAID) 3000 units tablet Take by mouth 3 (three) times daily with meals.    [provider]  loratadine-pseudoephedrine (CLARITIN-D 12-HOUR) 5-120 MG per tablet Take 1 tablet by mouth daily.    [provider]  LORazepam (ATIVAN) 0.5 MG tablet Take 0.5-1 mg by mouth daily as needed for anxiety. Anxiety    [provider]  losartan (COZAAR) 50 MG tablet Take 50 mg by mouth Daily. 10/27/10   [provider]  losartan-hydrochlorothiazide (HYZAAR) 50-12.5 MG tablet Take 1 tablet by mouth daily. 10/31/16   [provider]  montelukast (SINGULAIR) 10 MG tablet Take 10 mg by mouth daily. 02/21/14   [provider]  Multiple Vitamins-Minerals (OCUVITE PO) Take 1 tablet by mouth 2 (two) times daily.     [provider]  omeprazole (PRILOSEC) 20 MG capsule Take 20 mg by mouth Nightly.      [provider]  pravastatin (PRAVACHOL) 40 MG tablet Take 40 mg by mouth at bedtime.  09/04/10   [provider]  zaleplon (SONATA) 5 MG capsule Take 10 mg by mouth daily.  08/31/13   [provider]    Family History Family History  Problem Relation Age of Onset  . Colon polyps Sister 83       PRECANCEROUS POLYPS  . Breast cancer Maternal Grandmother 10  . Emphysema Father   . Asthma Father   . Heart disease Father   . Heart disease Paternal Grandfather   . Colon cancer Neg Hx     Social History Social History  Substance Use Topics  . Smoking status: Former Smoker    Packs/day: 1.00    Years: 20.00    Types: Cigarettes    Quit date: 05/21/1991  . Smokeless tobacco: Never Used  . Alcohol use No     Allergies   Food; Tequin; Codeine; Erythromycin; Levaquin [levofloxacin in d5w]; Wellbutrin [bupropion hcl]; Penicillins; and Prednisone   Review of Systems Review of Systems  Constitutional: Negative for activity change, appetite change, chills and fever.  HENT: Negative for congestion and sinus pain.   Eyes: Negative for visual disturbance.  Respiratory: Negative for shortness of breath.   Cardiovascular:  Negative for chest pain, palpitations and leg swelling.  Gastrointestinal: Negative for abdominal pain, constipation, diarrhea, nausea and vomiting.  Genitourinary: Negative for dysuria.  Musculoskeletal: Positive for neck stiffness. Negative for back pain, gait problem, joint swelling, myalgias and neck pain.  Skin: Negative for rash.  Neurological: Negative for dizziness, tremors, seizures, syncope, speech difficulty, weakness, light-headedness, numbness and headaches.  Psychiatric/Behavioral: Negative for agitation and behavioral problems.   Physical Exam Updated Vital Signs BP 130/81 (BP Location: Right Arm)   Pulse 94   Temp 98.1 F (36.7 C) (Oral)   Resp 20   Ht 5\' 3"  (1.6 m)   Wt 97.5 kg (215 lb)   SpO2 96%   BMI 38.09 kg/m  Physical Exam  Constitutional: She is oriented to person, place, and time. She appears well-developed and well-nourished. No distress.  HENT:  Head: Normocephalic and atraumatic.  Nose: Nose normal.  Mouth/Throat: Oropharynx is clear and moist.  Eyes: Pupils are equal, round, and reactive to light. Conjunctivae and EOM are normal.  Neck: Normal range of motion. Neck supple.  Cardiovascular: Normal rate, regular rhythm, normal heart sounds and intact distal pulses.   No murmur heard. Pulmonary/Chest: Effort normal and breath sounds normal. No respiratory distress.  Abdominal: Soft. She exhibits no distension. There is no tenderness. There is no guarding.  Musculoskeletal: Normal range of motion. She exhibits tenderness. She exhibits no edema or deformity.       Right ankle: She exhibits ecchymosis. She exhibits normal range of motion, no swelling, no deformity and normal pulse. Tenderness. Head of 5th metatarsal tenderness found.       Feet:  Lymphadenopathy:    She has no cervical adenopathy.  Neurological: She is alert and oriented to person, place, and time. No cranial nerve deficit. She exhibits normal muscle tone.  Skin: Skin is warm and dry.  No rash noted.  Psychiatric: She has a normal mood and affect. Her behavior is normal. Judgment and thought content normal.     ED Treatments / Results  Labs (all labs ordered are listed, but only abnormal results are displayed) Labs Reviewed - No data to display  EKG  EKG Interpretation None       Radiology Dg Foot Complete Right  Result Date: 01/31/2017 CLINICAL DATA:  Right foot pain and bruising dorsally specially along the fourth and fifth metatarsals. Fall this morning. EXAM: RIGHT FOOT COMPLETE - 3+ VIEW COMPARISON:  None. FINDINGS: A prominent bony demineralization. Transverse fracture the proximal metaphysis proximal phalanx of the small toe. Linear calcifications suspicious for a an medial avulsion along the base of the proximal phalanx fourth toe. There is also some cortical ill definition along the distal metaphysis of the fourth toe which could be from a nondisplaced fracture. Lisfranc joint alignment normal. Dorsal soft tissue swelling along the distal forefoot. Plantar and Achilles calcaneal spurs. IMPRESSION: 1. Fractures of the proximal phalanges of the fourth and fifth toes. Electronically Signed   By: Van Clines M.D.   On: 01/31/2017 20:25   Dg Hip Unilat W Or Wo Pelvis 2-3 Views Right  Result Date: 01/31/2017 CLINICAL DATA:  Fall this morning.  Right hip pain. EXAM: DG HIP (WITH OR WITHOUT PELVIS) 2-3V RIGHT COMPARISON:  03/09/2016 FINDINGS: There is evidence of lower lumbar spondylosis and degenerative disc disease. Bony demineralization. Chronic spurring along the greater trochanter. No acute fractures identified. IMPRESSION: 1. No acute fracture identified. 2. Lower lumbar spondylosis and degenerative disc disease. 3. Bony demineralization. Electronically Signed   By: Van Clines M.D.   On: 01/31/2017 20:27    Procedures Procedures (including critical care time)  Medications Ordered in ED Medications - No data to display   Initial Impression /  Assessment and Plan / ED Course  I have reviewed the triage vital signs and the nursing notes.  Pertinent labs & imaging results that were available during my care of the patient were reviewed by me and considered in my medical decision making (see chart for details).     71 year old female presents with right foot and hip pain after mechanical fall at home. No head trauma, no midline neck tenderness. Right hip x-ray negative for fracture.  X-ray of right foot demonstrates proximal  4th and 5th phalange fractures. Patient fitted with cam walker in ED and instructed to follow up with her orthopedic specialist. Stable for discharge home. Patient verbalized understanding and agreement with plan.   Final Clinical Impressions(s) / ED Diagnoses   Final diagnoses:  Closed fracture of multiple phalanges of toe of right foot, initial encounter    New Prescriptions Discharge Medication List as of 01/31/2017  8:44 PM     Lucila Maine, DO PGY-2, [facility] 01/31/2017 9:15 PM    Steve Rattler, DO 01/31/17 2115    Sherwood Gambler, MD 01/31/17 2308

## 2017-01-31 NOTE — ED Triage Notes (Signed)
Pt stumped left toes into a stool and fell-fell again getting up-pain to right foot, left toes, right shoulder, left arm and neck-presents to triage in w/c-NAD

## 2017-02-10 DIAGNOSIS — M25674 Stiffness of right foot, not elsewhere classified: Secondary | ICD-10-CM | POA: Diagnosis not present

## 2017-02-17 DIAGNOSIS — M25674 Stiffness of right foot, not elsewhere classified: Secondary | ICD-10-CM | POA: Diagnosis not present

## 2017-04-17 DIAGNOSIS — J209 Acute bronchitis, unspecified: Secondary | ICD-10-CM | POA: Diagnosis not present

## 2017-04-17 DIAGNOSIS — R0602 Shortness of breath: Secondary | ICD-10-CM | POA: Diagnosis not present

## 2017-05-06 ENCOUNTER — Encounter (INDEPENDENT_AMBULATORY_CARE_PROVIDER_SITE_OTHER): Payer: Medicare Other | Admitting: Ophthalmology

## 2017-05-06 DIAGNOSIS — H353112 Nonexudative age-related macular degeneration, right eye, intermediate dry stage: Secondary | ICD-10-CM | POA: Diagnosis not present

## 2017-05-06 DIAGNOSIS — I10 Essential (primary) hypertension: Secondary | ICD-10-CM | POA: Diagnosis not present

## 2017-05-06 DIAGNOSIS — H43813 Vitreous degeneration, bilateral: Secondary | ICD-10-CM

## 2017-05-06 DIAGNOSIS — H353221 Exudative age-related macular degeneration, left eye, with active choroidal neovascularization: Secondary | ICD-10-CM | POA: Diagnosis not present

## 2017-05-06 DIAGNOSIS — H35033 Hypertensive retinopathy, bilateral: Secondary | ICD-10-CM | POA: Diagnosis not present

## 2017-05-08 ENCOUNTER — Emergency Department (HOSPITAL_COMMUNITY): Payer: Medicare Other

## 2017-05-08 ENCOUNTER — Emergency Department (HOSPITAL_COMMUNITY)
Admission: EM | Admit: 2017-05-08 | Discharge: 2017-05-08 | Disposition: A | Discharge status: Home / Self Care | Payer: Medicare Other | Attending: Emergency Medicine | Admitting: Emergency Medicine

## 2017-05-08 DIAGNOSIS — I10 Essential (primary) hypertension: Secondary | ICD-10-CM | POA: Diagnosis not present

## 2017-05-08 DIAGNOSIS — Z23 Encounter for immunization: Secondary | ICD-10-CM | POA: Diagnosis not present

## 2017-05-08 DIAGNOSIS — Z87891 Personal history of nicotine dependence: Secondary | ICD-10-CM | POA: Insufficient documentation

## 2017-05-08 DIAGNOSIS — Y9389 Activity, other specified: Secondary | ICD-10-CM | POA: Diagnosis not present

## 2017-05-08 DIAGNOSIS — S52691A Other fracture of lower end of right ulna, initial encounter for closed fracture: Secondary | ICD-10-CM | POA: Insufficient documentation

## 2017-05-08 DIAGNOSIS — S5011XA Contusion of right forearm, initial encounter: Secondary | ICD-10-CM | POA: Diagnosis not present

## 2017-05-08 DIAGNOSIS — W101XXA Fall (on)(from) sidewalk curb, initial encounter: Secondary | ICD-10-CM | POA: Diagnosis not present

## 2017-05-08 DIAGNOSIS — J45909 Unspecified asthma, uncomplicated: Secondary | ICD-10-CM | POA: Diagnosis not present

## 2017-05-08 DIAGNOSIS — Y9289 Other specified places as the place of occurrence of the external cause: Secondary | ICD-10-CM | POA: Diagnosis not present

## 2017-05-08 DIAGNOSIS — Z7982 Long term (current) use of aspirin: Secondary | ICD-10-CM | POA: Insufficient documentation

## 2017-05-08 DIAGNOSIS — T148XXA Other injury of unspecified body region, initial encounter: Secondary | ICD-10-CM | POA: Diagnosis not present

## 2017-05-08 DIAGNOSIS — Y999 Unspecified external cause status: Secondary | ICD-10-CM | POA: Insufficient documentation

## 2017-05-08 DIAGNOSIS — W010XXA Fall on same level from slipping, tripping and stumbling without subsequent striking against object, initial encounter: Secondary | ICD-10-CM

## 2017-05-08 DIAGNOSIS — S51811A Laceration without foreign body of right forearm, initial encounter: Secondary | ICD-10-CM | POA: Insufficient documentation

## 2017-05-08 DIAGNOSIS — J449 Chronic obstructive pulmonary disease, unspecified: Secondary | ICD-10-CM | POA: Diagnosis not present

## 2017-05-08 DIAGNOSIS — S52601A Unspecified fracture of lower end of right ulna, initial encounter for closed fracture: Secondary | ICD-10-CM | POA: Diagnosis not present

## 2017-05-08 DIAGNOSIS — Z79899 Other long term (current) drug therapy: Secondary | ICD-10-CM | POA: Insufficient documentation

## 2017-05-08 DIAGNOSIS — S59911A Unspecified injury of right forearm, initial encounter: Secondary | ICD-10-CM | POA: Diagnosis present

## 2017-05-08 DIAGNOSIS — S6991XA Unspecified injury of right wrist, hand and finger(s), initial encounter: Secondary | ICD-10-CM | POA: Diagnosis not present

## 2017-05-08 MED ORDER — LIDOCAINE-EPINEPHRINE (PF) 2 %-1:200000 IJ SOLN
20.0000 mL | Freq: Once | INTRAMUSCULAR | Status: AC
  Administered 2017-05-08: 20 mL
  Filled 2017-05-08: qty 20

## 2017-05-08 MED ORDER — CEPHALEXIN 500 MG PO CAPS
500.0000 mg | ORAL_CAPSULE | Freq: Once | ORAL | Status: AC
  Administered 2017-05-08: 500 mg via ORAL
  Filled 2017-05-08: qty 1

## 2017-05-08 MED ORDER — CEPHALEXIN 500 MG PO CAPS: 500.0000 mg | ORAL_CAPSULE | Freq: Three times a day (TID) | ORAL | 0 refills | Status: DC

## 2017-05-08 MED ORDER — TETANUS-DIPHTH-ACELL PERTUSSIS 5-2.5-18.5 LF-MCG/0.5 IM SUSP
0.5000 mL | Freq: Once | INTRAMUSCULAR | Status: AC
  Administered 2017-05-08: 0.5 mL via INTRAMUSCULAR
  Filled 2017-05-08: qty 0.5

## 2017-05-08 MED ORDER — HYDROCODONE-ACETAMINOPHEN 5-325 MG PO TABS: 1.0000 | ORAL_TABLET | Freq: Four times a day (QID) | ORAL | 0 refills | Status: DC | PRN

## 2017-05-08 MED ORDER — ONDANSETRON HCL 4 MG/2ML IJ SOLN
4.0000 mg | Freq: Once | INTRAMUSCULAR | Status: AC
  Administered 2017-05-08: 4 mg via INTRAVENOUS
  Filled 2017-05-08: qty 2

## 2017-05-08 MED ORDER — ONDANSETRON 8 MG PO TBDP: 8.0000 mg | ORAL_TABLET | Freq: Three times a day (TID) | ORAL | 0 refills | Status: DC | PRN

## 2017-05-08 MED ORDER — HYDROMORPHONE HCL 1 MG/ML IJ SOLN
1.0000 mg | Freq: Once | INTRAMUSCULAR | Status: AC
  Administered 2017-05-08: 1 mg via INTRAVENOUS
  Filled 2017-05-08: qty 1

## 2017-05-08 NOTE — ED Provider Notes (Addendum)
[facility] DEPT Provider Note   CSN: [id number] Arrival date & time: 05/08/17  1253     History   Chief Complaint Chief Complaint  Patient presents with  . Fall    HPI [name] Molina is a 71 y.o. female.  Patient s/p fall just prior to coming to ED today. Was meeting friends for lunch, tried to step over curb, and tripped, fell forward.  Contusion/laceration to right forearm and wrist. Pain is constant, dull, moderate, worse w movement. Is right hand dominant. Denies numbness/weakness. Denies head injury. No headache. No faintness or dizziness prior to fall. Denies neck or back pain. Ambulatory since. Denies other pain or injury. Last tetanus unknown.    The history is provided by the patient.  Fall  Pertinent negatives include no chest pain, no abdominal pain, no headaches and no shortness of breath.    Past Medical History:  Diagnosis Date  . Anxiety   . Asthma   . Atrial tachycardia ([place])   . Cataract   . COPD (chronic obstructive pulmonary disease) ([place])   . Diverticulosis   . GERD (gastroesophageal reflux disease)   . Hyperlipidemia   . Hypertension   . Insomnia   . Macular degeneration   . Migraines   . Neuropathy     Patient Active Problem List   Diagnosis Date Noted  . RUQ abdominal pain 11/27/2015  . Suprapubic pain 11/27/2015  . Dysphagia 11/27/2015  . Abdominal mass, RUQ (right upper quadrant) 11/27/2015  . Obstructive sleep apnea 04/06/2015  . PAT (paroxysmal atrial tachycardia) ([place]) 08/09/2014  . GERD (gastroesophageal reflux disease)   . Hyperlipidemia   . Spinal stenosis of lumbar region 03/16/2014  . Idiopathic peripheral neuropathy 03/16/2014  . Hypertension 06/13/2011  . Allergic rhinitis 06/13/2011  . Chronic headaches 06/13/2011  . Emphysema 06/13/2011  . COPD (chronic obstructive pulmonary disease) ([place]) 04/25/2007    Past Surgical History:  Procedure Laterality Date  . CERVICAL POLYPECTOMY     INSIDE AND OUTSIDE  . COLONOSCOPY    . POLYPECTOMY    . REMOVED CARTLIAGE RIB      OB History    No data available       Home Medications    Prior to Admission medications   Medication Sig Start Date End Date Taking? Authorizing Provider  acetaminophen (TYLENOL) 500 MG tablet Take 1,000 mg by mouth every 6 (six) hours as needed for moderate pain or headache.    [provider]  albuterol (PROVENTIL HFA;VENTOLIN HFA) 108 (90 BASE) MCG/ACT inhaler Inhale 1 puff into the lungs every 6 (six) hours as needed. Shortness of breath 06/04/13   Collene Gobble, MD  aspirin 81 MG tablet Take 81 mg by mouth daily.      [provider]  aspirin-acetaminophen-caffeine (EXCEDRIN MIGRAINE) [phone] MG per tablet Take 2 tablets by mouth every 6 (six) hours as needed for headache.    [provider]  BESIVANCE 0.6 % SUSP Place 1 drop into the left eye 4 (four) times daily.  09/07/14   [provider]  CARTIA XT 240 MG 24 hr capsule TAKE ONE CAPSULE BY MOUTH DAILY 03/19/16   Jerline Pain, MD  clobetasol cream (TEMOVATE) 5.03 % Apply 1 application topically daily as needed (yeast.).  11/01/13   [provider]  FLUoxetine (PROZAC) 20 MG capsule Take 40 mg by mouth Daily.  10/23/10   [provider]  FLUoxetine (PROZAC) 40 MG capsule Take 40 mg  by mouth every morning. 11/19/16   [provider]  fluticasone (FLONASE) 50 MCG/ACT nasal spray Place 1 spray into both nostrils daily. 07/19/16   Collene Gobble, MD  Fluticasone-Salmeterol (ADVAIR DISKUS) 250-50 MCG/DOSE AEPB Inhale 1 puff into the lungs 2 (two) times daily. 01/07/12   Collene Gobble, MD  ibuprofen (ADVIL,MOTRIN) 200 MG tablet Take 400 mg by mouth every 6 (six) hours as needed for headache or moderate pain.    [provider]  lactase (LACTAID) 3000 units tablet Take by mouth 3 (three) times daily with meals.    [provider]  loratadine-pseudoephedrine (CLARITIN-D  12-HOUR) 5-120 MG per tablet Take 1 tablet by mouth daily.    [provider]  LORazepam (ATIVAN) 0.5 MG tablet Take 0.5-1 mg by mouth daily as needed for anxiety. Anxiety    [provider]  losartan (COZAAR) 50 MG tablet Take 50 mg by mouth Daily. 10/27/10   [provider]  losartan-hydrochlorothiazide (HYZAAR) 50-12.5 MG tablet Take 1 tablet by mouth daily. 10/31/16   [provider]  montelukast (SINGULAIR) 10 MG tablet Take 10 mg by mouth daily. 02/21/14   [provider]  Multiple Vitamins-Minerals (OCUVITE PO) Take 1 tablet by mouth 2 (two) times daily.     [provider]  omeprazole (PRILOSEC) 20 MG capsule Take 20 mg by mouth Nightly.      [provider]  pravastatin (PRAVACHOL) 40 MG tablet Take 40 mg by mouth at bedtime.  09/04/10   [provider]  Sulfamethoxazole-Trimethoprim (BACTRIM PO) Take by mouth.    [provider]  zaleplon (SONATA) 5 MG capsule Take 10 mg by mouth daily.  08/31/13   [provider]    Family History Family History  Problem Relation Age of Onset  . Colon polyps Sister 57       PRECANCEROUS POLYPS  . Breast cancer Maternal Grandmother 47  . Emphysema Father   . Asthma Father   . Heart disease Father   . Heart disease Paternal Grandfather   . Colon cancer Neg Hx     Social History Social History   Tobacco Use  . Smoking status: Former Smoker    Packs/day: 1.00    Years: 20.00    Pack years: 20.00    Types: Cigarettes    Last attempt to quit: 05/21/1991    Years since quitting: 25.9  . Smokeless tobacco: Never Used  Substance Use Topics  . Alcohol use: No  . Drug use: No     Allergies   Food; Tequin; Codeine; Erythromycin; Levaquin [levofloxacin in d5w]; Wellbutrin [bupropion hcl]; Penicillins; and Prednisone   Review of Systems Review of Systems  Constitutional: Negative for fever.  HENT: Negative for nosebleeds.   Eyes: Negative for visual  disturbance.  Respiratory: Negative for shortness of breath.   Cardiovascular: Negative for chest pain.  Gastrointestinal: Negative for abdominal pain and vomiting.  Genitourinary: Negative for flank pain.  Musculoskeletal: Negative for back pain and neck pain.  Skin: Negative for wound.  Neurological: Negative for weakness, numbness and headaches.  Hematological: Does not bruise/bleed easily.  Psychiatric/Behavioral: Negative for confusion.     Physical Exam Updated Vital Signs BP (!) 149/85   Pulse 89   Temp 97.6 F (36.4 C) (Oral)   Resp 15   SpO2 96%   Physical Exam  Constitutional: She appears well-developed and well-nourished. No distress.  HENT:  Head: Atraumatic.  Eyes: Conjunctivae are normal. Pupils are equal, round, and  reactive to light. No scleral icterus.  Neck: Normal range of motion. Neck supple. No tracheal deviation present.  Cardiovascular: Normal rate, regular rhythm, normal heart sounds and intact distal pulses.  Pulmonary/Chest: Effort normal and breath sounds normal. No respiratory distress. She exhibits no tenderness.  Abdominal: Soft. Normal appearance and bowel sounds are normal. She exhibits no distension. There is no tenderness.  Musculoskeletal: She exhibits no edema.  CTLS spine, non tender, aligned, no step off. 4 cm laceration to dorsal aspect mid right forearm. Tenderness to mid right forearm, and right wrist. No focal scaphoid tenderness. Radial pulse 2+. No other focal bony tenderness on bilateral extremity exam.   Neurological: She is alert.  Motor intact bil, stre 5/5. RUE/hand, rad/med/uln fxn, motor and sens, intact.   Skin: Skin is warm and dry. No rash noted. She is not diaphoretic.  Psychiatric: She has a normal mood and affect.  Nursing note and vitals reviewed.    ED Treatments / Results  Labs (all labs ordered are listed, but only abnormal results are displayed) Results for orders placed or performed in visit on 57/01/77    Basic metabolic panel  Result Value Ref Range   Sodium 141 135 - 145 mEq/L   Potassium 4.3 3.5 - 5.1 mEq/L   Chloride 103 96 - 112 mEq/L   CO2 30 19 - 32 mEq/L   Glucose, Bld 91 70 - 99 mg/dL   BUN 14 6 - 23 mg/dL   Creatinine, Ser 0.81 0.40 - 1.20 mg/dL   Calcium 9.6 8.4 - 10.5 mg/dL   GFR 73.98 >60.00 mL/min   Dg Forearm Right  Result Date: 05/08/2017 CLINICAL DATA:  71 year old who slipped off of a curb and fell earlier today, injuring the right forearm and right wrist. Laceration to the forearm. Initial encounter. Current history of neuropathy. EXAM: RIGHT FOREARM - 2 VIEW COMPARISON:  None. FINDINGS: Soft tissue laceration involving the dorsal forearm without visible opaque foreign bodies. Fracture involving the distal ulnar metaphysis with slight volar displacement of the distal fragment. No fractures elsewhere. Osseous demineralization. Visualized elbow joint intact. IMPRESSION: 1. Fracture involving the distal ulnar metaphysis with slight volar angulation of the distal fragment. 2. Soft tissue laceration involving the dorsal forearm. No visible opaque foreign bodies. Electronically Signed   By: Evangeline Dakin M.D.   On: 05/08/2017 14:36   Dg Wrist Complete Right  Result Date: 05/08/2017 CLINICAL DATA:  71 year old who slipped off of a curb and fell earlier today, injuring the right forearm and right wrist. Laceration to the forearm. Initial encounter. Current history of neuropathy. EXAM: RIGHT WRIST - COMPLETE 3+ VIEW COMPARISON:  None. FINDINGS: No evidence of acute fracture or dislocation. Osseous demineralization. Well-preserved joint spaces for age. Accessory ossicles adjacent to the ulnar styloid and adjacent to the trapezium-first metacarpal joint space. IMPRESSION: No acute osseous abnormality. Electronically Signed   By: Evangeline Dakin M.D.   On: 05/08/2017 14:34    EKG  EKG Interpretation None       Radiology Dg Forearm Right  Result Date: 05/08/2017 CLINICAL  DATA:  71 year old who slipped off of a curb and fell earlier today, injuring the right forearm and right wrist. Laceration to the forearm. Initial encounter. Current history of neuropathy. EXAM: RIGHT FOREARM - 2 VIEW COMPARISON:  None. FINDINGS: Soft tissue laceration involving the dorsal forearm without visible opaque foreign bodies. Fracture involving the distal ulnar metaphysis with slight volar displacement of the distal fragment. No fractures elsewhere. Osseous demineralization. Visualized elbow joint  intact. IMPRESSION: 1. Fracture involving the distal ulnar metaphysis with slight volar angulation of the distal fragment. 2. Soft tissue laceration involving the dorsal forearm. No visible opaque foreign bodies. Electronically Signed   By: Evangeline Dakin M.D.   On: 05/08/2017 14:36   Dg Wrist Complete Right  Result Date: 05/08/2017 CLINICAL DATA:  71 year old who slipped off of a curb and fell earlier today, injuring the right forearm and right wrist. Laceration to the forearm. Initial encounter. Current history of neuropathy. EXAM: RIGHT WRIST - COMPLETE 3+ VIEW COMPARISON:  None. FINDINGS: No evidence of acute fracture or dislocation. Osseous demineralization. Well-preserved joint spaces for age. Accessory ossicles adjacent to the ulnar styloid and adjacent to the trapezium-first metacarpal joint space. IMPRESSION: No acute osseous abnormality. Electronically Signed   By: Evangeline Dakin M.D.   On: 05/08/2017 14:34    Procedures .Marland KitchenLaceration Repair Date/Time: 05/08/2017 3:20 PM Performed by: Lajean Saver, MD Authorized by: Lajean Saver, MD   Consent:    Consent obtained:  Verbal Anesthesia (see MAR for exact dosages):    Anesthesia method:  Local infiltration   Local anesthetic:  Lidocaine 2% WITH epi Laceration details:    Length (cm):  4 Repair type:    Repair type:  Intermediate Pre-procedure details:    Preparation:  Patient was prepped and draped in usual sterile  fashion Exploration:    Wound exploration: entire depth of wound probed and visualized   Treatment:    Area cleansed with:  Betadine   Amount of cleaning:  Extensive   Irrigation solution:  Sterile saline   Irrigation method:  Syringe Skin repair:    Repair method:  Sutures   Suture size:  4-0   Suture material:  Prolene   Suture technique:  Simple interrupted   Number of sutures:  6 Post-procedure details:    Patient tolerance of procedure:  Tolerated well, no immediate complications Comments:     Skin tear, with deeper wound, contused, avulsed skin   (including critical care time)  Medications Ordered in ED Medications  Tdap (BOOSTRIX) injection 0.5 mL (not administered)     Initial Impression / Assessment and Plan / ED Course  I have reviewed the triage vital signs and the nursing notes.  Pertinent labs & imaging results that were available during my care of the patient were reviewed by me and considered in my medical decision making (see chart for details).  Wound cleaned. Sterile dressing. Tetanus im.  Xrays.  Dilaudid 1 mg iv. zofran iv.   Reviewed nursing notes and prior charts for additional history.     Pt indicates Dr Fredonia Highland is her orthopedist and would like to use that group - consulted.   Open wound appears remote from fracture on xr. Would cleaned very well, wound edges approximated as well as able. Steri strips also applied.   Given large/contused wound, contam prior to loosely approx, will cover w few days keflex.   Dr Percell Miller indicates to splint, d/c, and have f/u office 830 tomorrow.   Recheck, pain controlled, radial pulse 2+. Rue nvi.   Final Clinical Impressions(s) / ED Diagnoses   Final diagnoses:  None    ED Discharge Orders    None           Lajean Saver, MD 05/08/17 1531

## 2017-05-08 NOTE — ED Notes (Signed)
Bed: WA01 Expected date:  Expected time:  Means of arrival:  Comments: EMS fall 

## 2017-05-08 NOTE — ED Triage Notes (Signed)
Per EMS: Pt is coming from [place] tripped and fell in the parking lot. Pt reports no LOC and pt did not hit her head. Pt takes 1 baby aspirin everyday, no other blood thinners.  Pt put her right arm out in front of her to try and catch her fall.  Pt reports they heard a snap.  Skin tear noted at the site No obvious deformity noted with EMS Good PMS noted by EMS Pt has a 20g in the LAC 50 of fentanyl give on scene 10/10 with movement pain Last vitals   150/100 BP CBG 114 HR 80 RR 16

## 2017-05-08 NOTE — Discharge Instructions (Signed)
It was our pleasure to provide your ER care today - we hope that you feel better.  Elevate arm.  Icepack/cold to sore area.   Keep wound very clean.  Have sutures removed in 9-10 days.   Take antibiotic as prescribed.  Take motrin or aleve as need for pain. You may also take hydrocodone as need for pain. No driving for the next 6 hours or when taking hydrocodone. Also, do not take tylenol or acetaminophen containing medication when taking hydrocodone.  Take zofran as need for nausea.  Follow up with Dr Percell Miller tomorrow morning at 8:30.   Return to ER if worse, new or severe pain, infection of wound, other concern.

## 2017-05-09 DIAGNOSIS — M25674 Stiffness of right foot, not elsewhere classified: Secondary | ICD-10-CM | POA: Diagnosis not present

## 2017-05-22 DIAGNOSIS — X58XXXA Exposure to other specified factors, initial encounter: Secondary | ICD-10-CM | POA: Diagnosis not present

## 2017-05-22 DIAGNOSIS — G8918 Other acute postprocedural pain: Secondary | ICD-10-CM | POA: Diagnosis not present

## 2017-05-22 DIAGNOSIS — S52201A Unspecified fracture of shaft of right ulna, initial encounter for closed fracture: Secondary | ICD-10-CM | POA: Diagnosis not present

## 2017-05-22 DIAGNOSIS — Y999 Unspecified external cause status: Secondary | ICD-10-CM | POA: Diagnosis not present

## 2017-05-22 DIAGNOSIS — S52231A Displaced oblique fracture of shaft of right ulna, initial encounter for closed fracture: Secondary | ICD-10-CM | POA: Diagnosis not present

## 2017-05-30 DIAGNOSIS — S52201D Unspecified fracture of shaft of right ulna, subsequent encounter for closed fracture with routine healing: Secondary | ICD-10-CM | POA: Diagnosis not present

## 2017-06-09 DIAGNOSIS — S52201D Unspecified fracture of shaft of right ulna, subsequent encounter for closed fracture with routine healing: Secondary | ICD-10-CM | POA: Diagnosis not present

## 2017-06-12 ENCOUNTER — Encounter: Payer: Self-pay | Admitting: Emergency Medicine

## 2017-06-12 ENCOUNTER — Ambulatory Visit (INDEPENDENT_AMBULATORY_CARE_PROVIDER_SITE_OTHER): Payer: Medicare Other | Admitting: Emergency Medicine

## 2017-06-12 DIAGNOSIS — J301 Allergic rhinitis due to pollen: Secondary | ICD-10-CM | POA: Diagnosis not present

## 2017-06-12 DIAGNOSIS — J449 Chronic obstructive pulmonary disease, unspecified: Secondary | ICD-10-CM | POA: Diagnosis not present

## 2017-06-12 MED ORDER — ALBUTEROL SULFATE HFA 108 (90 BASE) MCG/ACT IN AERS: 2.0000 | INHALATION_SPRAY | Freq: Four times a day (QID) | RESPIRATORY_TRACT | 6 refills | Status: DC | PRN

## 2017-06-12 NOTE — Addendum Note (Signed)
Addended by: Dolores Lory on: 06/12/2017 09:58 AM   Modules accepted: Orders

## 2017-06-12 NOTE — Progress Notes (Signed)
Subjective:    Patient ID: [name] Molina, female    DOB: 1946/05/19, 72 y.o.   MRN: [id number] HPI  ROV 07/19/16 --  This follow-up visit for patient with a history of allergic rhinitis, chronic cough and obstructive lung disease in setting childhood asthma dn hx tobacco use. She underwent a PSG 06/25/16 that did not show any clinically significant OSA, did show mild PLMD. She has bee managed on Advair. Also on flonase, singulair, loratadine. She reports that her breathing has been OK, but she did not have any Advair for January due to cost. She is also off of her nasal steroid right now. Uses albuterol several times a week, more over the last week.   ROV 12/09/16 -- this follow-up visit for patient with a history of obstructive lung disease (childhood asthma, history tobacco and COPD). Also with a history of allergic rhinitis and chronic cough. Since last time she fx her R heel - had to go through rehab. She feels that she is breathing fairly well - she is able to exert, walks w a cane or walker. Able to shop with a cart. Her breathing is stablke since last time. She remains on Advair, prefers it to alternatives. She is off flonase and singulair until Fall, will restart then. She is on loratadine daily. She remains on omeprazole. Uses albuterol very rarely. No flares since last time.   ROV 06/12/17 --[name] Molina is a 72 year old woman with a history of obstructive lung disease due to COPD and also contributions from childhood asthma.  She has allergic rhinitis, GERD and chronic cough as well.  Polysomnogram has shown PLMD. She has experienced a couple falls since our last visit. She has been doing fairly well, has been working to clean out some things at home. She has noticed that she has exertional SOB with these chores, dust exposure. She believes that the higher humidity has contributed as well - has had more dyspnea, no wheeze, no cough. She is managed on Advair. Uses albuterol about 2-3x a day right now.  She is on singulair, loratadine, off flonase right now.    No flowsheet data found.     Objective:   Physical Exam Vitals:   06/12/17 0936  BP: 136/76  Pulse: 91  SpO2: 95%  Weight: 226 lb (102.5 kg)  Height: 5\' 3"  (1.6 m)   Gen: Pleasant, overweight woman, in no distress,  normal affect  ENT: No lesions,  mouth clear,  oropharynx clear, no postnasal drip  Neck: No JVD, no stridor  Lungs: No use of accessory muscles,  clear without rales or rhonchi, no wheezing  Cardiovascular: RRR, heart sounds normal, no murmur or gallops, no peripheral edema  Musculoskeletal: She has a brace on her right wrist  Neuro: alert, non focal  Skin: Warm, no lesions or rashes     Assessment & Plan:  Allergic rhinitis Continue Singulair, loratadine as you are taking them Add back fluticasone nasal spray once a day during the spring and fall months  COPD (chronic obstructive pulmonary disease) Please stay on Advair twice a day. Remember to rinse and gargle after using Please find your inhaler formulary provided by your insurance company.  Call our office to let us know which medications are covered.  Based on this we may decide to try an alternative. We will refill your albuterol.  Please use this 2 puffs up to every 4 hours if needed for short of breath, chest tightness, wheezing. Flu shot up-to-date, pneumonia shots are  up-to-date  Baltazar Apo, MD, PhD 06/12/2017, 9:53 AM [facility] [phone] or if no answer [phone]

## 2017-06-12 NOTE — Assessment & Plan Note (Signed)
Continue Singulair, loratadine as you are taking them Add back fluticasone nasal spray once a day during the spring and fall months

## 2017-06-12 NOTE — Patient Instructions (Addendum)
Please stay on Advair twice a day. Remember to rinse and gargle after using Please find your inhaler formulary provided by your insurance company.  Call our office to let us know which medications are covered.  Based on this we may decide to try an alternative. We will refill your albuterol.  Please use this 2 puffs up to every 4 hours if needed for short of breath, chest tightness, wheezing. Flu shot up-to-date, pneumonia shots are up-to-date Continue Singulair, loratadine as you are taking them Add back fluticasone nasal spray once a day during the spring and fall months Follow with Dr Lamonte Sakai in 6 months or sooner if you have any problems

## 2017-06-12 NOTE — Assessment & Plan Note (Signed)
Please stay on Advair twice a day. Remember to rinse and gargle after using Please find your inhaler formulary provided by your insurance company.  Call our office to let us know which medications are covered.  Based on this we may decide to try an alternative. We will refill your albuterol.  Please use this 2 puffs up to every 4 hours if needed for short of breath, chest tightness, wheezing. Flu shot up-to-date, pneumonia shots are up-to-date

## 2017-06-23 DIAGNOSIS — S52201D Unspecified fracture of shaft of right ulna, subsequent encounter for closed fracture with routine healing: Secondary | ICD-10-CM | POA: Diagnosis not present

## 2017-07-14 DIAGNOSIS — E785 Hyperlipidemia, unspecified: Secondary | ICD-10-CM | POA: Diagnosis not present

## 2017-07-14 DIAGNOSIS — F325 Major depressive disorder, single episode, in full remission: Secondary | ICD-10-CM | POA: Diagnosis not present

## 2017-07-14 DIAGNOSIS — I10 Essential (primary) hypertension: Secondary | ICD-10-CM | POA: Diagnosis not present

## 2017-07-14 DIAGNOSIS — J449 Chronic obstructive pulmonary disease, unspecified: Secondary | ICD-10-CM | POA: Diagnosis not present

## 2017-07-21 DIAGNOSIS — S52201D Unspecified fracture of shaft of right ulna, subsequent encounter for closed fracture with routine healing: Secondary | ICD-10-CM | POA: Diagnosis not present

## 2017-07-25 DIAGNOSIS — J069 Acute upper respiratory infection, unspecified: Secondary | ICD-10-CM | POA: Diagnosis not present

## 2017-08-19 ENCOUNTER — Encounter (INDEPENDENT_AMBULATORY_CARE_PROVIDER_SITE_OTHER): Payer: Medicare Other | Admitting: Ophthalmology

## 2017-08-19 DIAGNOSIS — I10 Essential (primary) hypertension: Secondary | ICD-10-CM | POA: Diagnosis not present

## 2017-08-19 DIAGNOSIS — H353112 Nonexudative age-related macular degeneration, right eye, intermediate dry stage: Secondary | ICD-10-CM | POA: Diagnosis not present

## 2017-08-19 DIAGNOSIS — H43813 Vitreous degeneration, bilateral: Secondary | ICD-10-CM | POA: Diagnosis not present

## 2017-08-19 DIAGNOSIS — H35033 Hypertensive retinopathy, bilateral: Secondary | ICD-10-CM

## 2017-08-19 DIAGNOSIS — H353221 Exudative age-related macular degeneration, left eye, with active choroidal neovascularization: Secondary | ICD-10-CM

## 2017-11-19 ENCOUNTER — Other Ambulatory Visit: Payer: Self-pay | Admitting: Physician Assistant

## 2017-11-19 DIAGNOSIS — L821 Other seborrheic keratosis: Secondary | ICD-10-CM | POA: Diagnosis not present

## 2017-11-19 DIAGNOSIS — D0439 Carcinoma in situ of skin of other parts of face: Secondary | ICD-10-CM | POA: Diagnosis not present

## 2017-11-19 DIAGNOSIS — D229 Melanocytic nevi, unspecified: Secondary | ICD-10-CM | POA: Diagnosis not present

## 2017-11-26 ENCOUNTER — Ambulatory Visit (HOSPITAL_BASED_OUTPATIENT_CLINIC_OR_DEPARTMENT_OTHER)
Admission: RE | Admit: 2017-11-26 | Discharge: 2017-11-26 | Disposition: A | Discharge status: Home / Self Care | Payer: Medicare Other | Source: Ambulatory Visit | Attending: Pulmonary Disease | Admitting: Pulmonary Disease

## 2017-11-26 ENCOUNTER — Encounter: Payer: Self-pay | Admitting: Pulmonary Disease

## 2017-11-26 ENCOUNTER — Other Ambulatory Visit (INDEPENDENT_AMBULATORY_CARE_PROVIDER_SITE_OTHER): Payer: Medicare Other

## 2017-11-26 ENCOUNTER — Ambulatory Visit (INDEPENDENT_AMBULATORY_CARE_PROVIDER_SITE_OTHER): Payer: Medicare Other | Admitting: Pulmonary Disease

## 2017-11-26 DIAGNOSIS — E041 Nontoxic single thyroid nodule: Secondary | ICD-10-CM | POA: Insufficient documentation

## 2017-11-26 DIAGNOSIS — R0600 Dyspnea, unspecified: Secondary | ICD-10-CM | POA: Diagnosis not present

## 2017-11-26 DIAGNOSIS — R911 Solitary pulmonary nodule: Secondary | ICD-10-CM | POA: Diagnosis not present

## 2017-11-26 DIAGNOSIS — R0602 Shortness of breath: Secondary | ICD-10-CM

## 2017-11-26 DIAGNOSIS — I700 Atherosclerosis of aorta: Secondary | ICD-10-CM | POA: Diagnosis not present

## 2017-11-26 DIAGNOSIS — J449 Chronic obstructive pulmonary disease, unspecified: Secondary | ICD-10-CM | POA: Diagnosis not present

## 2017-11-26 DIAGNOSIS — K760 Fatty (change of) liver, not elsewhere classified: Secondary | ICD-10-CM | POA: Insufficient documentation

## 2017-11-26 LAB — BASIC METABOLIC PANEL
BUN: 15 mg/dL (ref 6–23)
CALCIUM: 9.1 mg/dL (ref 8.4–10.5)
CO2: 27 meq/L (ref 19–32)
Chloride: 105 mEq/L (ref 96–112)
Creatinine, Ser: 0.9 mg/dL (ref 0.40–1.20)
GFR: 65.14 mL/min (ref >60.00)
Glucose, Bld: 107 mg/dL — ABNORMAL HIGH (ref 70–99)
Potassium: 4.2 mEq/L (ref 3.5–5.1)
SODIUM: 140 meq/L (ref 135–145)

## 2017-11-26 LAB — BRAIN NATRIURETIC PEPTIDE: PRO B NATRI PEPTIDE: 118 pg/mL — AB (ref 0.0–100.0)

## 2017-11-26 MED ORDER — IOPAMIDOL (ISOVUE-370) INJECTION 76%
100.0000 mL | Freq: Once | INTRAVENOUS | Status: AC | PRN
  Administered 2017-11-26: 74 mL via INTRAVENOUS

## 2017-11-26 NOTE — Progress Notes (Signed)
   Subjective:    Patient ID: [name] Molina, female    DOB: March 28, 1946, 72 y.o.   MRN: [id number]  HPI   72 year old woman with a history of obstructive lung disease due to COPD and also contributions from childhood asthma.  She has allergic rhinitis, GERD and chronic cough as well.  She smoked less than 20 pack years  Chief Complaint  Patient presents with  . Follow-up    Increased SOB that started over the past 24hrs. Believes its related to the high humidity. Non-productive cough.    Comes in as an acute visit today.  She is limited mobility due to poor balance and osteoarthritis and embolus with a cane.  She had a fall on [place] and had bruising on her backside.  Still has some pain. Developed acute onset shortness of breath yesterday and this was worse today and she just feels like she wants to "go to sleep".  Oxygen saturation noted to be 91% on room air, she was feeling dyspneic from walking from her chair to the exam table  No preceding URI symptoms or chest cold, reports occasional wheezing and mild swelling of left lower extremity  Past Medical History:  Diagnosis Date  . Anxiety   . Asthma   . Atrial tachycardia ([place])   . Cataract   . COPD (chronic obstructive pulmonary disease) ([place])   . Diverticulosis   . GERD (gastroesophageal reflux disease)   . Hyperlipidemia   . Hypertension   . Insomnia   . Macular degeneration   . Migraines   . Neuropathy       Review of Systems neg for any significant sore throat, dysphagia, itching, sneezing, nasal congestion or excess/ purulent secretions, fever, chills, sweats, unintended wt loss, pleuritic or exertional cp, hempoptysis, orthopnea pnd or change in chronic leg swelling.   Also denies presyncope, palpitations, heartburn, abdominal pain, nausea, vomiting, diarrhea or change in bowel or urinary habits, dysuria,hematuria, rash, arthralgias, visual complaints, headache, numbness weakness or ataxia.     Objective:   Physical Exam    Gen. Pleasant, obese, in no distress ENT - no lesions, no post nasal drip Neck: No JVD, no thyromegaly, no carotid bruits Lungs: no use of accessory muscles, no dullness to percussion, decreased without rales or rhonchi  Cardiovascular: Rhythm regular, heart sounds  normal, no murmurs or gallops, no peripheral edema Musculoskeletal: No deformities, no cyanosis or clubbing , no tremors No calf tenderness         Assessment & Plan:

## 2017-11-26 NOTE — Assessment & Plan Note (Signed)
Ct advair

## 2017-11-26 NOTE — Assessment & Plan Note (Signed)
Appears to be acute onset, associated with hypoxia. No preceding URI symptoms and no exam findings of bronchospasm to suggest COPD exacerbation. No evidence of fluid overload or overt heart failure Have to consider VTE in this setting  Checked labs, BNP level is low-renal function is normal We will proceed with CT angiogram stat. If no other cause identified then will trial low-dose Lasix

## 2017-11-26 NOTE — Patient Instructions (Signed)
Blood work today. CT angiogram to rule out blood clot in the lungs

## 2017-11-27 ENCOUNTER — Telehealth: Payer: Self-pay | Admitting: Pulmonary Disease

## 2017-11-27 LAB — D-DIMER, QUANTITATIVE: D-Dimer, Quant: 0.38 mcg/mL FEU (ref <0.50)

## 2017-11-27 MED ORDER — FUROSEMIDE 40 MG PO TABS: 40.0000 mg | ORAL_TABLET | Freq: Every day | ORAL | 0 refills | Status: DC

## 2017-11-27 NOTE — Telephone Encounter (Signed)
Spoke with pt. Pt is requesting her CT results from yesterday.  Dr. Elsworth Soho - please advise. Thanks!

## 2017-11-27 NOTE — Telephone Encounter (Signed)
See CT results from yesterday. Will close this encounter.

## 2017-11-27 NOTE — Telephone Encounter (Signed)
Called and spoke with patient, apologized for inconvenience. Medication sent in. Nothing further needed.

## 2017-12-01 ENCOUNTER — Encounter: Payer: Self-pay | Admitting: Pulmonary Disease

## 2017-12-01 ENCOUNTER — Ambulatory Visit (INDEPENDENT_AMBULATORY_CARE_PROVIDER_SITE_OTHER): Payer: Medicare Other | Admitting: Pulmonary Disease

## 2017-12-01 VITALS — BP 118/82 | HR 87 | Ht 63.0 in | Wt 225.8 lb

## 2017-12-01 DIAGNOSIS — R0602 Shortness of breath: Secondary | ICD-10-CM

## 2017-12-01 DIAGNOSIS — J449 Chronic obstructive pulmonary disease, unspecified: Secondary | ICD-10-CM

## 2017-12-01 DIAGNOSIS — R911 Solitary pulmonary nodule: Secondary | ICD-10-CM | POA: Diagnosis not present

## 2017-12-01 MED ORDER — TIOTROPIUM BROMIDE MONOHYDRATE 1.25 MCG/ACT IN AERS: 2.0000 | INHALATION_SPRAY | Freq: Every day | RESPIRATORY_TRACT | 0 refills | Status: DC

## 2017-12-01 NOTE — Addendum Note (Signed)
Addended by: Valerie Salts on: 12/01/2017 10:43 AM   Modules accepted: Orders

## 2017-12-01 NOTE — Assessment & Plan Note (Signed)
If dyspnea persists, consider checking for anemia

## 2017-12-01 NOTE — Patient Instructions (Addendum)
Okay to observe off Lasix. Call as if shortness of breath gets worse  Sample of  spiriva respimat 2 puffs once daily- call for Rx if this works

## 2017-12-01 NOTE — Assessment & Plan Note (Signed)
Repeat CT chest without contrast in 6 months for follow-up in this ex-smoker

## 2017-12-01 NOTE — Assessment & Plan Note (Signed)
Ct advair  Sample of  spiriva respimat 2 puffs once daily- call for Rx if this works

## 2017-12-01 NOTE — Progress Notes (Signed)
   Subjective:    Patient ID: [name] Molina, female    DOB: May 11, 1946, 72 y.o.   MRN: [id number]  HPI  72 year old woman with a history of obstructive lung disease due to COPD and  childhood asthma. She has allergic rhinitis,GERD and chronic cough.  She smoked less than 20 pack years   Seen  as an acute visit  11/26/17.  She is limited mobility due to poor balance and osteoarthritis and ambulates with a cane.  She had a fall on [place] and had bruising on her backside.  Still has some pain. Developed acute onset shortness of breath   Oxygen saturation noted to be 91% on room air, she was  dyspneic from walking from her chair to the exam table  CT angio neg for PE showed mild right basal scarring and 5 mm nodule in the right middle lung zone.  She was given Lasix for 5 days and diuresed well and pedal edema has improved  Spirometry shows moderate airway obstruction and FEV1 of 54, FVC of 68% with ratio 59  Past Medical History:  Diagnosis Date  . Anxiety   . Asthma   . Atrial tachycardia ([place])   . Cataract   . COPD (chronic obstructive pulmonary disease) ([place])   . Diverticulosis   . GERD (gastroesophageal reflux disease)   . Hyperlipidemia   . Hypertension   . Insomnia   . Macular degeneration   . Migraines   . Neuropathy      Review of Systems neg for any significant sore throat, dysphagia, itching, sneezing, nasal congestion or excess/ purulent secretions, fever, chills, sweats, unintended wt loss, pleuritic or exertional cp, hempoptysis, orthopnea pnd or change in chronic leg swelling. Also denies presyncope, palpitations, heartburn, abdominal pain, nausea, vomiting, diarrhea or change in bowel or urinary habits, dysuria,hematuria, rash, arthralgias, visual complaints, headache, numbness weakness or ataxia.     Objective:   Physical Exam  Gen. Pleasant, obese, in no distress ENT - no lesions, no post nasal drip Neck: No JVD, no thyromegaly, no carotid  bruits Lungs: no use of accessory muscles, no dullness to percussion, decreased without rales or rhonchi  Cardiovascular: Rhythm regular, heart sounds  normal, no murmurs or gallops, no peripheral edema Musculoskeletal: No deformities, no cyanosis or clubbing , no tremors        Assessment & Plan:

## 2017-12-02 ENCOUNTER — Encounter (INDEPENDENT_AMBULATORY_CARE_PROVIDER_SITE_OTHER): Payer: Medicare Other | Admitting: Ophthalmology

## 2017-12-02 DIAGNOSIS — H43813 Vitreous degeneration, bilateral: Secondary | ICD-10-CM | POA: Diagnosis not present

## 2017-12-02 DIAGNOSIS — H353112 Nonexudative age-related macular degeneration, right eye, intermediate dry stage: Secondary | ICD-10-CM | POA: Diagnosis not present

## 2017-12-02 DIAGNOSIS — I110 Hypertensive heart disease with heart failure: Secondary | ICD-10-CM | POA: Diagnosis not present

## 2017-12-02 DIAGNOSIS — H35033 Hypertensive retinopathy, bilateral: Secondary | ICD-10-CM | POA: Diagnosis not present

## 2017-12-02 DIAGNOSIS — H353221 Exudative age-related macular degeneration, left eye, with active choroidal neovascularization: Secondary | ICD-10-CM

## 2017-12-11 ENCOUNTER — Other Ambulatory Visit: Payer: Self-pay | Admitting: Pulmonary Disease

## 2017-12-11 ENCOUNTER — Telehealth: Payer: Self-pay | Admitting: Pulmonary Disease

## 2017-12-11 MED ORDER — TIOTROPIUM BROMIDE MONOHYDRATE 1.25 MCG/ACT IN AERS: 2.0000 | INHALATION_SPRAY | Freq: Every day | RESPIRATORY_TRACT | 5 refills | Status: DC

## 2017-12-11 NOTE — Telephone Encounter (Signed)
Spoke with patient. She is aware that RX has been approved. [facility] [place] RX has been sent. Nothing else needed at time of call.

## 2017-12-11 NOTE — Telephone Encounter (Signed)
Pt called back to let RA know that she is benefiting from using Spiriva inhaler Pt reports her O2 sats stay in 95% in last 2 weeks. Pt would like to know if a rx can be sent in to pharmacy for Spiriva.   RA please advise.

## 2017-12-11 NOTE — Telephone Encounter (Signed)
Okay to send a prescription for a month with 5 refills

## 2017-12-12 NOTE — Telephone Encounter (Signed)
RA please advise if we can change to the spiriva handihaler since the respimat is not covered by the insurance.  Thanks

## 2017-12-12 NOTE — Telephone Encounter (Signed)
OK 

## 2017-12-15 ENCOUNTER — Other Ambulatory Visit: Payer: Self-pay | Admitting: Pulmonary Disease

## 2017-12-15 MED ORDER — TIOTROPIUM BROMIDE MONOHYDRATE 18 MCG IN CAPS: 18.0000 ug | ORAL_CAPSULE | Freq: Every day | RESPIRATORY_TRACT | 6 refills | Status: DC

## 2017-12-15 NOTE — Telephone Encounter (Signed)
Medication has been sent to pharmacy.  °

## 2017-12-24 DIAGNOSIS — Z23 Encounter for immunization: Secondary | ICD-10-CM | POA: Diagnosis not present

## 2017-12-24 DIAGNOSIS — Z0000 Encounter for general adult medical examination without abnormal findings: Secondary | ICD-10-CM | POA: Diagnosis not present

## 2017-12-24 DIAGNOSIS — F324 Major depressive disorder, single episode, in partial remission: Secondary | ICD-10-CM | POA: Diagnosis not present

## 2017-12-24 DIAGNOSIS — J449 Chronic obstructive pulmonary disease, unspecified: Secondary | ICD-10-CM | POA: Diagnosis not present

## 2017-12-24 DIAGNOSIS — R002 Palpitations: Secondary | ICD-10-CM | POA: Diagnosis not present

## 2017-12-24 DIAGNOSIS — R0601 Orthopnea: Secondary | ICD-10-CM | POA: Diagnosis not present

## 2017-12-24 DIAGNOSIS — R0600 Dyspnea, unspecified: Secondary | ICD-10-CM | POA: Diagnosis not present

## 2017-12-24 DIAGNOSIS — I10 Essential (primary) hypertension: Secondary | ICD-10-CM | POA: Diagnosis not present

## 2017-12-24 DIAGNOSIS — J309 Allergic rhinitis, unspecified: Secondary | ICD-10-CM | POA: Diagnosis not present

## 2017-12-24 DIAGNOSIS — E785 Hyperlipidemia, unspecified: Secondary | ICD-10-CM | POA: Diagnosis not present

## 2017-12-24 DIAGNOSIS — R7301 Impaired fasting glucose: Secondary | ICD-10-CM | POA: Diagnosis not present

## 2018-01-01 DIAGNOSIS — D0439 Carcinoma in situ of skin of other parts of face: Secondary | ICD-10-CM | POA: Diagnosis not present

## 2018-01-02 ENCOUNTER — Encounter: Payer: Self-pay | Admitting: Emergency Medicine

## 2018-01-02 ENCOUNTER — Ambulatory Visit (INDEPENDENT_AMBULATORY_CARE_PROVIDER_SITE_OTHER): Payer: Medicare Other | Admitting: Emergency Medicine

## 2018-01-02 DIAGNOSIS — J301 Allergic rhinitis due to pollen: Secondary | ICD-10-CM

## 2018-01-02 DIAGNOSIS — J449 Chronic obstructive pulmonary disease, unspecified: Secondary | ICD-10-CM | POA: Diagnosis not present

## 2018-01-02 DIAGNOSIS — R911 Solitary pulmonary nodule: Secondary | ICD-10-CM

## 2018-01-02 NOTE — Progress Notes (Signed)
  Subjective:    Patient ID: [name] Molina, female    DOB: 09/21/1945, 72 y.o.   MRN: [id number] HPI  ROV 12/09/16 -- this follow-up visit for patient with a history of obstructive lung disease (childhood asthma, history tobacco and COPD). Also with a history of allergic rhinitis and chronic cough. Since last time she fx her R heel - had to go through rehab. She feels that she is breathing fairly well - she is able to exert, walks w a cane or walker. Able to shop with a cart. Her breathing is stablke since last time. She remains on Advair, prefers it to alternatives. She is off flonase and singulair until Fall, will restart then. She is on loratadine daily. She remains on omeprazole. Uses albuterol very rarely. No flares since last time.   ROV 06/12/17 --[name] Molina is a 72 year old woman with a history of obstructive lung disease due to COPD and also contributions from childhood asthma.  She has allergic rhinitis, GERD and chronic cough as well.  Polysomnogram has shown PLMD. She has experienced a couple falls since our last visit. She has been doing fairly well, has been working to clean out some things at home. She has noticed that she has exertional SOB with these chores, dust exposure. She believes that the higher humidity has contributed as well - has had more dyspnea, no wheeze, no cough. She is managed on Advair. Uses albuterol about 2-3x a day right now. She is on singulair, loratadine, off flonase right now.    ROV 01/02/18 --72 year old woman with COPD/fixed asthma, allergic rhinitis, GERD and chronic cough.  FEV1 1.12L in 11/2017. She has PLMD.   Has been managed on Singulair,loratadine,  Flonase, Prilosec for her exacerbating factors. She had been managed on Advair. Was seen by Dr Elsworth Soho in July for acute dyspnea, some increased LE edema. Received short course lasix, started on Spiriva Respimat - she felt that it may have helped her breathing some, but then she learned that it cost $500. Also had a  CT-PA 11/26/17, shows a 69mm RUL nodule. nee   No flowsheet data found.     Objective:   Physical Exam There were no vitals filed for this visit. Gen: Pleasant, overweight woman, in no distress,  normal affect  ENT: No lesions,  mouth clear,  oropharynx clear, no postnasal drip  Neck: No JVD, no stridor  Lungs: No use of accessory muscles, few scattered rhonchi, no wheeze  Cardiovascular: RRR, heart sounds normal, no murmur or gallops, no peripheral edema  Musculoskeletal: She has a brace on her right wrist  Neuro: alert, non focal  Skin: Warm, no lesions or rashes     Assessment & Plan:  COPD (chronic obstructive pulmonary disease) She did probably benefit some of the addition of Spiriva Respimat.  It was very costly, not clear as to whether it is not on her insurance formulary, may be she has an Geophysical data processor or is in the donut hole.  She will get me a formulary so that I can determine which Darrick Meigs will be most affordable.  I will either add this to her Advair or change the Advair to a laba/lama.   Allergic rhinitis Continue Singulair, loratadine, Flonase  Pulmonary nodule New 5 mm right lower lobe nodule noted on CT-PA from July 2019.  She needs a repeat scan July 2020 without contrast.  Baltazar Apo, MD, PhD 01/02/2018, 11:30 AM [facility] [phone] or if no answer [phone]

## 2018-01-02 NOTE — Patient Instructions (Addendum)
Please continue your Advair as you are taking it. Remember to rinse and gargle after using.  Please obtain the formulary information about your inhaled medicines from your insurance company and provide it for [place].  This will allow [place] to select the most cost effective inhaler. Your Prevnar is up to date.  You are due for the pneumovax. We will discuss next time - I do not want you to receive it right after your recent shingles vaccination.  Get the flu shot this Fall.  We need to repeat CT chest without contrast in July 2020 to follow your pulmonary nodule.  Follow with Dr Lamonte Sakai in 2 months or sooner if you have any problems.

## 2018-01-02 NOTE — Assessment & Plan Note (Signed)
Continue Singulair, loratadine, Flonase

## 2018-01-02 NOTE — Assessment & Plan Note (Signed)
She did probably benefit some of the addition of Spiriva Respimat.  It was very costly, not clear as to whether it is not on her insurance formulary, may be she has an Geophysical data processor or is in the donut hole.  She will get me a formulary so that I can determine which Darrick Meigs will be most affordable.  I will either add this to her Advair or change the Advair to a laba/lama.

## 2018-01-02 NOTE — Assessment & Plan Note (Signed)
New 5 mm right lower lobe nodule noted on CT-PA from July 2019.  She needs a repeat scan July 2020 without contrast.

## 2018-01-07 ENCOUNTER — Telehealth: Payer: Self-pay | Admitting: Emergency Medicine

## 2018-01-07 NOTE — Telephone Encounter (Signed)
Handicap placard has been placed in RB's sign folder. Will route to myself for follow up.

## 2018-01-07 NOTE — Telephone Encounter (Signed)
Formulary has been placed in RB's look at.

## 2018-01-08 MED ORDER — FLUTICASONE-UMECLIDIN-VILANT 100-62.5-25 MCG/INH IN AEPB: 1.0000 | INHALATION_SPRAY | Freq: Every day | RESPIRATORY_TRACT | 5 refills | Status: DC

## 2018-01-08 NOTE — Telephone Encounter (Signed)
Based on her Formulary, I would recommend changing Advair to Trelegy, both are tier 3. If she would like to do so then lets make the change. She will need teaching on trelegy either by [place] or at the pharmacy.

## 2018-01-08 NOTE — Telephone Encounter (Signed)
Form has been completed and returned to me. This has been placed in the outgoing mail per the pt's request. Pt is aware of this information. Nothing further was needed.

## 2018-01-08 NOTE — Telephone Encounter (Signed)
Spoke with patient-she is aware of change in inhalers per RB. [facility] have [facility] and they will be sure to teach patient how to use inhaler. Nothing more needed at this time.

## 2018-01-22 ENCOUNTER — Encounter: Payer: Self-pay | Admitting: Cardiology

## 2018-01-23 DIAGNOSIS — H903 Sensorineural hearing loss, bilateral: Secondary | ICD-10-CM | POA: Diagnosis not present

## 2018-02-06 ENCOUNTER — Encounter (INDEPENDENT_AMBULATORY_CARE_PROVIDER_SITE_OTHER): Payer: Self-pay

## 2018-02-06 ENCOUNTER — Encounter: Payer: Self-pay | Admitting: Cardiology

## 2018-02-06 ENCOUNTER — Ambulatory Visit (INDEPENDENT_AMBULATORY_CARE_PROVIDER_SITE_OTHER): Payer: Medicare Other | Admitting: Cardiology

## 2018-02-06 VITALS — BP 112/70 | HR 93 | Ht 63.0 in | Wt 227.0 lb

## 2018-02-06 DIAGNOSIS — I10 Essential (primary) hypertension: Secondary | ICD-10-CM | POA: Diagnosis not present

## 2018-02-06 DIAGNOSIS — J810 Acute pulmonary edema: Secondary | ICD-10-CM

## 2018-02-06 DIAGNOSIS — R0602 Shortness of breath: Secondary | ICD-10-CM | POA: Diagnosis not present

## 2018-02-06 MED ORDER — FUROSEMIDE 20 MG PO TABS: 20.0000 mg | ORAL_TABLET | Freq: Every day | ORAL | 3 refills | Status: DC | PRN

## 2018-02-06 NOTE — Patient Instructions (Addendum)
Medication Instructions:  You may take Furosemide 20 mg once a day as needed with swelling or shortness of breath. Continue all other medications as listed.  Testing/Procedures: Your physician has requested that you have an echocardiogram. Echocardiography is a painless test that uses sound waves to create images of your heart. It provides your doctor with information about the size and shape of your heart and how well your heart's chambers and valves are working. This procedure takes approximately one hour. There are no restrictions for this procedure.  Follow-Up: Follow up as needed after the above testing.  Thank you for choosing [facility]!!   Daily weights - please call if your weight increases 3 lbs overnight or 5 lbs in 1 week.  Weigh at the same time and with the same amount of clothing every day.

## 2018-02-06 NOTE — Progress Notes (Signed)
Cardiology Office Note:    Date:  02/06/2018   ID:  Tinnie, Kunin 1945/06/04, MRN [id number]  PCP:  Harlan Stains, MD  Cardiologist:  No primary care provider on file.  Electrophysiologist:  None   Referring MD: Harlan Stains, MD     History of Present Illness:    [name] Molina is a 72 y.o. female here for the evaluation of dyspnea, orthopnea, possible heart failure at the request of Dr. Harlan Stains.  Her last visit was greater than 3 years ago.  She had palpitations, paroxysmal atrial tachycardia.  Back in 2011 I also saw her and performed a stress test which was low risk and echocardiogram that was normal.  She had been on diltiazem for several years.  When driving for instance she felt the skipping.  They would self terminate.  Occasionally she would have a short burst.  This episode of shortness of breath occurred after eating out [employer], for lunch.  She enjoys the vegetables there.  After getting up from the table she could hardly breathe.  One breath per word.  She ended up seeing pulmonary, CT scan of chest was unremarkable.  I personally reviewed the CT scan and I do not see any coronary artery calcification.  She does have mitral annular calcification, quite moderate.  She was given 5 days of Lasix.  Felt better.  Her COPD inhaler also was changed but this was too expensive for her.  She has moderate airway obstruction.  She also has had problems with falls recently.  She is worried about using her cane out on the concrete.   Past Medical History:  Diagnosis Date  . Anxiety   . Asthma   . Atrial tachycardia ([place])   . Cataract   . COPD (chronic obstructive pulmonary disease) ([place])   . Diverticulosis   . GERD (gastroesophageal reflux disease)   . Hyperlipidemia   . Hypertension   . Insomnia   . Macular degeneration   . Migraines   . Neuropathy     Past Surgical History:  Procedure Laterality Date  . CERVICAL POLYPECTOMY     INSIDE AND  OUTSIDE  . COLONOSCOPY    . POLYPECTOMY    . REMOVED CARTLIAGE RIB      Current Medications: Current Meds  Medication Sig  . acetaminophen (TYLENOL) 500 MG tablet Take 1,000 mg by mouth every 6 (six) hours as needed for moderate pain or headache.  . albuterol (PROVENTIL HFA;VENTOLIN HFA) 108 (90 Base) MCG/ACT inhaler Inhale 2 puffs into the lungs every 6 (six) hours as needed for wheezing or shortness of breath.  Marland Kitchen aspirin 81 MG tablet Take 81 mg by mouth daily.    Marland Kitchen aspirin-acetaminophen-caffeine (EXCEDRIN MIGRAINE) 250-250-65 MG per tablet Take 2 tablets by mouth every 6 (six) hours as needed for headache.  Marland Kitchen CARTIA XT 240 MG 24 hr capsule TAKE ONE CAPSULE BY MOUTH DAILY  . clobetasol cream (TEMOVATE) 7.89 % Apply 1 application topically daily as needed (yeast.).   Marland Kitchen FLUoxetine (PROZAC) 40 MG capsule Take 40 mg by mouth every morning.  . fluticasone (FLONASE) 50 MCG/ACT nasal spray Place 1 spray into both nostrils daily.  . Fluticasone-Umeclidin-Vilant (TRELEGY ELLIPTA) 100-62.5-25 MCG/INH AEPB Inhale 1 puff into the lungs daily.  Marland Kitchen ibuprofen (ADVIL,MOTRIN) 200 MG tablet Take 400 mg by mouth every 6 (six) hours as needed for headache or moderate pain.  Marland Kitchen lactase (LACTAID) 3000 units tablet Take by mouth 3 (three) times daily  with meals.  Marland Kitchen loratadine-pseudoephedrine (CLARITIN-D 12-HOUR) 5-120 MG per tablet Take 1 tablet by mouth daily.  Marland Kitchen LORazepam (ATIVAN) 0.5 MG tablet Take 0.5-1 mg by mouth daily as needed for anxiety. Anxiety  . montelukast (SINGULAIR) 10 MG tablet Take 10 mg by mouth daily as needed.   . Multiple Vitamins-Minerals (OCUVITE PO) Take 1 tablet by mouth 2 (two) times daily.   Marland Kitchen omeprazole (PRILOSEC) 20 MG capsule Take 20 mg by mouth Nightly.    . pravastatin (PRAVACHOL) 40 MG tablet Take 40 mg by mouth at bedtime.   . valsartan-hydrochlorothiazide (DIOVAN-HCT) 160-12.5 MG tablet Take 1 tablet by mouth daily.     Allergies:   Food; Tequin; Codeine; Erythromycin;  Levaquin [levofloxacin in d5w]; Wellbutrin [bupropion hcl]; Penicillins; and Prednisone   Social History   Socioeconomic History  . Marital status: Divorced    Spouse name: Not on file  . Number of children: 3  . Years of education: College  . Highest education level: Not on file  Occupational History  . Occupation: Emergency planning/management officer  Social Needs  . Financial resource strain: Not on file  . Food insecurity:    Worry: Not on file    Inability: Not on file  . Transportation needs:    Medical: Not on file    Non-medical: Not on file  Tobacco Use  . Smoking status: Former Smoker    Packs/day: 1.00    Years: 20.00    Pack years: 20.00    Types: Cigarettes    Last attempt to quit: 05/21/1991    Years since quitting: 26.7  . Smokeless tobacco: Never Used  Substance and Sexual Activity  . Alcohol use: No  . Drug use: No  . Sexual activity: Not on file  Lifestyle  . Physical activity:    Days per week: Not on file    Minutes per session: Not on file  . Stress: Not on file  Relationships  . Social connections:    Talks on phone: Not on file    Gets together: Not on file    Attends religious service: Not on file    Active member of club or organization: Not on file    Attends meetings of clubs or organizations: Not on file    Relationship status: Not on file  Other Topics Concern  . Not on file  Social History Narrative   Patient is divorced with 3 children.   Patient is right handed.   Patient has a college education.   Patient drinks 1-2 servings daily.     Family History: The patient's family history includes Asthma in her father; Breast cancer (age of onset: 31) in her maternal grandmother; Colon polyps (age of onset: 67) in her sister; Emphysema in her father; Heart disease in her father and paternal grandfather. There is no history of Colon cancer.  ROS:   Please see the history of present illness.     All other systems reviewed and are  negative.  EKGs/Labs/Other Studies Reviewed:    The following studies were reviewed today:  Holter monitor in 2010 showed 20 PVCs and brief runs of PAT.   EKG: 01/06/2018-sinus rhythm short PR nonspecific ST-T wave changes 12/24/2017-sinus rhythm, short PR, nonspecific ST-T wave changes.  11/08/2013 shows sinus rhythm 88 nonspecific ST flattening no other abnormalities.    Recent Labs: 11/26/2017: BUN 15; Creatinine, Ser 0.90; Potassium 4.2; Pro B Natriuretic peptide (BNP) 118.0; Sodium 140  Recent Lipid Panel No results found for: CHOL, TRIG,  HDL, CHOLHDL, VLDL, LDLCALC, LDLDIRECT  Physical Exam:    VS:  BP 112/70   Pulse 93   Ht 5\' 3"  (1.6 m)   Wt 227 lb (103 kg)   SpO2 94%   BMI 40.21 kg/m     Wt Readings from Last 3 Encounters:  02/06/18 227 lb (103 kg)  12/01/17 225 lb 12.8 oz (102.4 kg)  11/26/17 230 lb 6.4 oz (104.5 kg)     GEN:  Well nourished, well developed in no acute distress, obese HEENT: Normal NECK: No JVD; No carotid bruits LYMPHATICS: No lymphadenopathy CARDIAC: RRR, no murmurs, rubs, gallops RESPIRATORY:  Clear to auscultation without rales, wheezing or rhonchi  ABDOMEN: Soft, non-tender, non-distended MUSCULOSKELETAL: Chronic bilateral lower extremity mild edema; No deformity  SKIN: Warm and dry NEUROLOGIC:  Alert and oriented x 3 PSYCHIATRIC:  Normal affect   ASSESSMENT:    1. Flash pulmonary edema (H[facility])   2. Essential hypertension   3. Shortness of breath   4. Morbid obesity ([place])    PLAN:    In order of problems listed above:  Dyspnea, edema, orthopnea, flash pulmonary edema episode -I will check an echocardiogram to ensure proper structure and function of her heart.  Previously this was normal.  I think she became acutely fluid overloaded, likely dietary indiscretion with a mix of underlying pulmonary disorder. - Certainly her morbid obesity could be playing a role as well however the seemed to be an acute event. - She also takes 12.5 mg  of HCTZ and her combination pill.  This is a very mild diuretic.  I will add Lasix 20 mg once a day as needed to her regimen to assist with gentle diuresis if she increases her weight by 3 pounds on her scale.  Her Lasix for 5 days given by Dr. Elsworth Soho seem to help.  Last creatinine on 12/24/2017 was 0.8, hemoglobin 13.9, TSH normal, LDL 56. - I personally reviewed her CT scan, no evidence of coronary calcification.  Mitral annular calcification was present.  Essential hypertension - Home blood pressures are mostly in the 120s over 70s.  Excellent.  Hyperlipidemia - Doing very well, no myalgias, feeling well.  COPD - Moderate airway obstruction.  Dr. Lamonte Sakai  Morbid obesity -Continue to encourage weight loss.  Decrease carbohydrates.  We will follow-up with results of echocardiogram     Medication Adjustments/Labs and Tests Ordered: Current medicines are reviewed at length with the patient today.  Concerns regarding medicines are outlined above.  Orders Placed This Encounter  Procedures  . EKG 12-Lead  . ECHOCARDIOGRAM COMPLETE   Meds ordered this encounter  Medications  . furosemide (LASIX) 20 MG tablet    Sig: Take 1 tablet (20 mg total) by mouth daily as needed.    Dispense:  30 tablet    Refill:  3    Patient Instructions  Medication Instructions:  You may take Furosemide 20 mg once a day as needed with swelling or shortness of breath. Continue all other medications as listed.  Testing/Procedures: Your physician has requested that you have an echocardiogram. Echocardiography is a painless test that uses sound waves to create images of your heart. It provides your doctor with information about the size and shape of your heart and how well your heart's chambers and valves are working. This procedure takes approximately one hour. There are no restrictions for this procedure.  Follow-Up: Follow up as needed after the above testing.  Thank you for choosing [facility]!!  Daily weights - please call if your weight increases 3 lbs overnight or 5 lbs in 1 week.  Weigh at the same time and with the same amount of clothing every day.    Signed, [name] Furbish, MD  02/06/2018 2:59 PM    [place]

## 2018-02-24 ENCOUNTER — Ambulatory Visit (HOSPITAL_COMMUNITY): Payer: Medicare Other | Attending: Cardiology

## 2018-02-24 ENCOUNTER — Other Ambulatory Visit: Payer: Self-pay

## 2018-02-24 DIAGNOSIS — R0602 Shortness of breath: Secondary | ICD-10-CM | POA: Insufficient documentation

## 2018-02-24 DIAGNOSIS — I10 Essential (primary) hypertension: Secondary | ICD-10-CM | POA: Diagnosis not present

## 2018-02-25 ENCOUNTER — Telehealth: Payer: Self-pay

## 2018-02-25 NOTE — Telephone Encounter (Signed)
Notes recorded by Frederik Schmidt, RN on 02/25/2018 at 8:36 AM EDT The patient has been notified of the result and verbalized understanding. All questions (if any) were answered. Frederik Schmidt, RN 02/25/2018 8:36 AM   ------

## 2018-02-25 NOTE — Telephone Encounter (Signed)
-----   Message from Jerline Pain, MD sent at 02/25/2018  6:38 AM EDT ----- Normal EF reassuring. Diastolic dysfunction, stiffness with relaxation noted.  Continue with lasix.  Weight loss Overall reassuring.  Candee Furbish, MD

## 2018-03-05 ENCOUNTER — Ambulatory Visit (INDEPENDENT_AMBULATORY_CARE_PROVIDER_SITE_OTHER): Payer: Medicare Other | Admitting: Emergency Medicine

## 2018-03-05 ENCOUNTER — Encounter: Payer: Self-pay | Admitting: Emergency Medicine

## 2018-03-05 DIAGNOSIS — J301 Allergic rhinitis due to pollen: Secondary | ICD-10-CM | POA: Diagnosis not present

## 2018-03-05 DIAGNOSIS — Z23 Encounter for immunization: Secondary | ICD-10-CM | POA: Diagnosis not present

## 2018-03-05 DIAGNOSIS — R911 Solitary pulmonary nodule: Secondary | ICD-10-CM

## 2018-03-05 DIAGNOSIS — J449 Chronic obstructive pulmonary disease, unspecified: Secondary | ICD-10-CM | POA: Diagnosis not present

## 2018-03-05 MED ORDER — ALBUTEROL SULFATE HFA 108 (90 BASE) MCG/ACT IN AERS: 2.0000 | INHALATION_SPRAY | Freq: Four times a day (QID) | RESPIRATORY_TRACT | 6 refills | Status: DC | PRN

## 2018-03-05 NOTE — Assessment & Plan Note (Signed)
5 mm pulmonary nodule that will need follow-up in July 2020.

## 2018-03-05 NOTE — Assessment & Plan Note (Signed)
Continue your nasal spray, loratadine daily. Agree with using your Singulair 10 mg each evening during the allergy months.

## 2018-03-05 NOTE — Progress Notes (Signed)
  Subjective:    Patient ID: [name] Molina, female    DOB: 06-01-1945, 72 y.o.   MRN: [id number] HPI  ROV 03/05/18 --[name] Molina is a 89, follows up today for COPD/fixed asthma, also has chronic cough in the setting of allergic rhinitis and GERD.  Also with [facility].  She has a 5 mm right upper lobe pulmonary nodule that was seen on CT scan of the chest 11/26/2017.  Based on her insurance formulary we decided to try changing her to Trelegy in August.  She reports that she believes that it is working well. She is having some throat clearing, some raspy voice. She is on singulair, nasal steroid, loratadine. Her PNA shot is UTD, flu shot. She is using lasix prn per Dr Marlou Porch.    No flowsheet data found.     Objective:   Physical Exam Vitals:   03/05/18 1108  BP: 128/70  Pulse: (!) 104  SpO2: 97%  Weight: 102.5 kg  Height: 5\' 2"  (1.575 m)   Gen: Pleasant, overweight woman, in no distress,  normal affect  ENT: No lesions,  mouth clear,  oropharynx clear, no postnasal drip  Neck: No JVD, no stridor  Lungs: No use of accessory muscles, few scattered rhonchi, no wheeze  Cardiovascular: RRR, heart sounds normal, no murmur or gallops, no peripheral edema  Musculoskeletal: She has a brace on her right wrist  Neuro: alert, non focal  Skin: Warm, no lesions or rashes     Assessment & Plan:  COPD (chronic obstructive pulmonary disease) She was able to tolerate and has benefited from the Trelegy.  It is affordable and she like to continue.  I agree with this.  I think her flare of dyspnea was multifactorial, likely is much diastolic CHF and COPD.  Both are better compensated at this time.  Please continue Trelegy 1 inhalation once daily as you have been taking it.  Remember to rinse and gargle after you use it. Keep your albuterol available to use 2 puffs up to every 4 hours if needed for shortness of breath, wheezing, chest tightness. Your pneumonia shot is up-to-date. Flu shot today. Follow  with Dr Lamonte Sakai in 6 months or sooner if you have any problems  Allergic rhinitis Continue your nasal spray, loratadine daily. Agree with using your Singulair 10 mg each evening during the allergy months.  Pulmonary nodule 5 mm pulmonary nodule that will need follow-up in July 2020.  Baltazar Apo, MD, PhD 03/05/2018, 11:34 AM [facility] [phone] or if no answer [phone]

## 2018-03-05 NOTE — Assessment & Plan Note (Signed)
She was able to tolerate and has benefited from the Trelegy.  It is affordable and she like to continue.  I agree with this.  I think her flare of dyspnea was multifactorial, likely is much diastolic CHF and COPD.  Both are better compensated at this time.  Please continue Trelegy 1 inhalation once daily as you have been taking it.  Remember to rinse and gargle after you use it. Keep your albuterol available to use 2 puffs up to every 4 hours if needed for shortness of breath, wheezing, chest tightness. Your pneumonia shot is up-to-date. Flu shot today. Follow with Dr Lamonte Sakai in 6 months or sooner if you have any problems

## 2018-03-05 NOTE — Patient Instructions (Addendum)
Please continue Trelegy 1 inhalation once daily as you have been taking it.  Remember to rinse and gargle after you use it. Keep your albuterol available to use 2 puffs up to every 4 hours if needed for shortness of breath, wheezing, chest tightness. Continue your nasal spray, loratadine daily. Agree with using your Singulair 10 mg each evening during the allergy months. Your pneumonia shot is up-to-date. Flu shot today. Continue your blood pressure and fluid medications as ordered by Dr. Marlou Porch. Follow with Dr Lamonte Sakai in 6 months or sooner if you have any problems

## 2018-03-24 ENCOUNTER — Encounter (INDEPENDENT_AMBULATORY_CARE_PROVIDER_SITE_OTHER): Payer: Medicare Other | Admitting: Ophthalmology

## 2018-03-24 DIAGNOSIS — H353112 Nonexudative age-related macular degeneration, right eye, intermediate dry stage: Secondary | ICD-10-CM

## 2018-03-24 DIAGNOSIS — H353221 Exudative age-related macular degeneration, left eye, with active choroidal neovascularization: Secondary | ICD-10-CM

## 2018-03-24 DIAGNOSIS — I10 Essential (primary) hypertension: Secondary | ICD-10-CM | POA: Diagnosis not present

## 2018-03-24 DIAGNOSIS — H43813 Vitreous degeneration, bilateral: Secondary | ICD-10-CM

## 2018-03-24 DIAGNOSIS — H35033 Hypertensive retinopathy, bilateral: Secondary | ICD-10-CM

## 2018-04-03 DIAGNOSIS — D229 Melanocytic nevi, unspecified: Secondary | ICD-10-CM | POA: Diagnosis not present

## 2018-04-03 DIAGNOSIS — L810 Postinflammatory hyperpigmentation: Secondary | ICD-10-CM | POA: Diagnosis not present

## 2018-05-02 ENCOUNTER — Other Ambulatory Visit: Payer: Self-pay | Admitting: Cardiology

## 2018-05-07 ENCOUNTER — Ambulatory Visit (INDEPENDENT_AMBULATORY_CARE_PROVIDER_SITE_OTHER): Payer: Medicare Other | Admitting: Pulmonary Disease

## 2018-05-07 ENCOUNTER — Encounter: Payer: Self-pay | Admitting: Pulmonary Disease

## 2018-05-07 VITALS — BP 118/62 | HR 88 | Temp 97.9°F | Ht 62.0 in | Wt 226.0 lb

## 2018-05-07 DIAGNOSIS — J449 Chronic obstructive pulmonary disease, unspecified: Secondary | ICD-10-CM | POA: Diagnosis not present

## 2018-05-07 DIAGNOSIS — J301 Allergic rhinitis due to pollen: Secondary | ICD-10-CM

## 2018-05-07 DIAGNOSIS — R0602 Shortness of breath: Secondary | ICD-10-CM

## 2018-05-07 DIAGNOSIS — R911 Solitary pulmonary nodule: Secondary | ICD-10-CM

## 2018-05-07 DIAGNOSIS — R600 Localized edema: Secondary | ICD-10-CM | POA: Diagnosis not present

## 2018-05-07 MED ORDER — AZITHROMYCIN 250 MG PO TABS: ORAL_TABLET | ORAL | 0 refills | Status: DC

## 2018-05-07 MED ORDER — FLUTICASONE-UMECLIDIN-VILANT 100-62.5-25 MCG/INH IN AEPB: 1.0000 | INHALATION_SPRAY | Freq: Every day | RESPIRATORY_TRACT | 0 refills | Status: DC

## 2018-05-07 MED ORDER — METHYLPREDNISOLONE ACETATE 80 MG/ML IJ SUSP
120.0000 mg | Freq: Once | INTRAMUSCULAR | Status: AC
  Administered 2018-05-07: 120 mg via INTRAMUSCULAR

## 2018-05-07 NOTE — Assessment & Plan Note (Signed)
Take your as needed Lasix today  Do the following things EVERY DAY:   1. Weigh yourself EVERY morning after you go to the bathroom but before you eat or drink anything. Write this number down in a weight log/diary.    2. Take your medicines as prescribed. If you have concerns about your medications, please call us before you stop taking them.    3. Eat low salt foods-Limit salt (sodium) to 2000 mg per day. This will help prevent your body from holding onto fluid. Read food labels as many processed foods have a lot of sodium, especially canned goods and prepackaged meats. If you would like some assistance choosing low sodium foods, we would be happy to set you up with a nutritionist.   4. Stay as active as you can everyday. Staying active will give you more energy and make your muscles stronger. Start with 5 minutes at a time and work your way up to 30 minutes a day. Break up your activities--do some in the morning and some in the afternoon. Start with 3 days per week and work your way up to 5 days as you can.  If you have chest pain, feel short of breath, dizzy, or lightheaded, STOP. If you don't feel better after a short rest, call 911. If you do feel better, call the office to let us know you have symptoms with exercise.   5. Limit all fluids for the day to less than 2 liters. Fluid includes all drinks, coffee, juice, ice chips, soup, jello, and all other liquids.  Follow-up with cardiology if lower extremity swelling does not improve

## 2018-05-07 NOTE — Assessment & Plan Note (Signed)
Depo Medrol 120mg  today   Azithromycin 250mg  tablet  >>>Take 2 tablets (500mg  total) today, and then 1 tablet (250mg ) for the next four days  >>>take with food  >>>can also take probiotic and / or yogurt while on antibiotic   Continue Trelegy Ellipta  >>> 1 puff daily in the morning >>>rinse mouth out after use   >>> This inhaler contains 3 medications that help manage her respiratory status, contact our office if you cannot afford this medication or unable to remain on this medication  Follow up with Dr Lamonte Sakai in April / 2020

## 2018-05-07 NOTE — Assessment & Plan Note (Signed)
Continue Claritin, Flonase, Singulair Can start nasal saline rinses to help with allergic rhinitis symptoms

## 2018-05-07 NOTE — Patient Instructions (Addendum)
Depo Medrol 120mg  today   Azithromycin 250mg  tablet  >>>Take 2 tablets (500mg  total) today, and then 1 tablet (250mg ) for the next four days  >>>take with food  >>>can also take probiotic and / or yogurt while on antibiotic   Continue Trelegy Ellipta  >>> 1 puff daily in the morning >>>rinse mouth out after use   >>> This inhaler contains 3 medications that help manage her respiratory status, contact our office if you cannot afford this medication or unable to remain on this medication   Follow up with Dr Lamonte Sakai in April / 2020   Do the following things EVERY DAY:   1. Weigh yourself EVERY morning after you go to the bathroom but before you eat or drink anything. Write this number down in a weight log/diary.    2. Take your medicines as prescribed. If you have concerns about your medications, please call us before you stop taking them.    3. Eat low salt foods-Limit salt (sodium) to 2000 mg per day. This will help prevent your body from holding onto fluid. Read food labels as many processed foods have a lot of sodium, especially canned goods and prepackaged meats. If you would like some assistance choosing low sodium foods, we would be happy to set you up with a nutritionist.   4. Stay as active as you can everyday. Staying active will give you more energy and make your muscles stronger. Start with 5 minutes at a time and work your way up to 30 minutes a day. Break up your activities--do some in the morning and some in the afternoon. Start with 3 days per week and work your way up to 5 days as you can.  If you have chest pain, feel short of breath, dizzy, or lightheaded, STOP. If you don't feel better after a short rest, call 911. If you do feel better, call the office to let us know you have symptoms with exercise.   5. Limit all fluids for the day to less than 2 liters. Fluid includes all drinks, coffee, juice, ice chips, soup, jello, and all other liquids.    It is flu season:    >>>Remember to be washing your hands regularly, using hand sanitizer, be careful to use around herself with has contact with people who are sick will increase her chances of getting sick yourself. >>> Best ways to protect herself from the flu: Receive the yearly flu vaccine, practice good hand hygiene washing with soap and also using hand sanitizer when available, eat a nutritious meals, get adequate rest, hydrate appropriately   Please contact the office if your symptoms worsen or you have concerns that you are not improving.   Thank you for choosing [facility] for your healthcare, and for allowing [place] to partner with you on your healthcare journey. I am thankful to be able to provide care to you today.   Wyn Quaker FNP-C   Chronic Obstructive Pulmonary Disease Chronic obstructive pulmonary disease (COPD) is a long-term (chronic) lung problem. When you have COPD, it is hard for air to get in and out of your lungs. Usually the condition gets worse over time, and your lungs will never return to normal. There are things you can do to keep yourself as healthy as possible.  Your doctor may treat your condition with: ? Medicines. ? Oxygen. ? Lung surgery.  Your doctor may also recommend: ? Rehabilitation. This includes steps to make your body work better. It may involve  a team of specialists. ? Quitting smoking, if you smoke. ? Exercise and changes to your diet. ? Comfort measures (palliative care). Follow these instructions at home: Medicines  Take over-the-counter and prescription medicines only as told by your doctor.  Talk to your doctor before taking any cough or allergy medicines. You may need to avoid medicines that cause your lungs to be dry. Lifestyle  If you smoke, stop. Smoking makes the problem worse. If you need help quitting, ask your doctor.  Avoid being around things that make your breathing worse. This may include smoke, chemicals, and fumes.  Stay active,  but remember to rest as well.  Learn and use tips on how to relax.  Make sure you get enough sleep. Most adults need at least 7 hours of sleep every night.  Eat healthy foods. Eat smaller meals more often. Rest before meals. Controlled breathing Learn and use tips on how to control your breathing as told by your doctor. Try:  Breathing in (inhaling) through your nose for 1 second. Then, pucker your lips and breath out (exhale) through your lips for 2 seconds.  Putting one hand on your belly (abdomen). Breathe in slowly through your nose for 1 second. Your hand on your belly should move out. Pucker your lips and breathe out slowly through your lips. Your hand on your belly should move in as you breathe out.  Controlled coughing Learn and use controlled coughing to clear mucus from your lungs. Follow these steps: 1. Lean your head a little forward. 2. Breathe in deeply. 3. Try to hold your breath for 3 seconds. 4. Keep your mouth slightly open while coughing 2 times. 5. Spit any mucus out into a tissue. 6. Rest and do the steps again 1 or 2 times as needed. General instructions  Make sure you get all the shots (vaccines) that your doctor recommends. Ask your doctor about a flu shot and a pneumonia shot.  Use oxygen therapy and pulmonary rehabilitation if told by your doctor. If you need home oxygen therapy, ask your doctor if you should buy a tool to measure your oxygen level (oximeter).  Make a COPD action plan with your doctor. This helps you to know what to do if you feel worse than usual.  Manage any other conditions you have as told by your doctor.  Avoid going outside when it is very hot, cold, or humid.  Avoid people who have a sickness you can catch (contagious).  Keep all follow-up visits as told by your doctor. This is important. Contact a doctor if:  You cough up more mucus than usual.  There is a change in the color or thickness of the mucus.  It is harder to  breathe than usual.  Your breathing is faster than usual.  You have trouble sleeping.  You need to use your medicines more often than usual.  You have trouble doing your normal activities such as getting dressed or walking around the house. Get help right away if:  You have shortness of breath while resting.  You have shortness of breath that stops you from: ? Being able to talk. ? Doing normal activities.  Your chest hurts for longer than 5 minutes.  Your skin color is more blue than usual.  Your pulse oximeter shows that you have low oxygen for longer than 5 minutes.  You have a fever.  You feel too tired to breathe normally. Summary  Chronic obstructive pulmonary disease (COPD) is a long-term lung  problem.  The way your lungs work will never return to normal. Usually the condition gets worse over time. There are things you can do to keep yourself as healthy as possible.  Take over-the-counter and prescription medicines only as told by your doctor.  If you smoke, stop. Smoking makes the problem worse. This information is not intended to replace advice given to you by your health care provider. Make sure you discuss any questions you have with your health care provider. Document Released: 10/23/2007 Document Revised: 06/10/2016 Document Reviewed: 06/10/2016 Elsevier Interactive Patient Education  2019 [employer].

## 2018-05-07 NOTE — Assessment & Plan Note (Signed)
Follow-up CT to be completed in July /2020

## 2018-05-07 NOTE — Progress Notes (Signed)
@Patient  ID: [name] Molina, female    DOB: 25-Jun-1945, 72 y.o.   MRN: [id number]  Chief Complaint  Patient presents with  . Acute Visit    Coughing, increased wheezing    Referring provider: Harlan Stains, MD  HPI:  72 year old female former smoker followed in our office for COPD/fixed asthma, chronic cough, allergic rhinitis, 5 mm right upper lobe pulmonary nodule that we will follow on a CT in July 2020  PMH: GERD, edema in lower extremities Smoker/ Smoking History: Former smoker.  Quit 1993.  20-pack-year.  Maintenance: Trelegy Ellipta Pt of: Dr. Lamonte Sakai  Recent [facility] Encounters:   ROV 03/05/18 --Ms. Hurd is a 49, follows up today for COPD/fixed asthma, also has chronic cough in the setting of allergic rhinitis and GERD.  Also with [facility].  She has a 5 mm right upper lobe pulmonary nodule that was seen on CT scan of the chest 11/26/2017.  Based on her insurance formulary we decided to try changing her to Trelegy in August.  She reports that she believes that it is working well. She is having some throat clearing, some raspy voice. She is on singulair, nasal steroid, loratadine. Her PNA shot is UTD, flu shot. She is using lasix prn per Dr Marlou Porch.  Plan: Continue trelegy, flu shot today, pneumonia shot is up-to-date, follow-up in 6 months or sooner if you have problems, CT in July/2020, continue Claritin, continue nasal spray, continue Singulair  05/07/2018  - Visit   72 year old female presents her office today with increased shortness of breath for 3 days.  Patient reports that on 05/04/2018 symptoms started with sore throat, increased congestion, occasional bouts of shortness of breath and cough.  Cough worsened significantly yesterday on 05/06/2018 which caused patient have to use her rescue inhaler and had multiple coughing fits which prompted the appointment to be scheduled today.  Sore throat has improved today.  Patient now also reporting increased wheezing with  exertion.  Patient denies GERD or acid reflux-like symptoms.  Cough is productive with green mucus this is a change from her baseline.  Patient also believes that she has had increased sputum production from her baseline.  Patient remains adherent to Trelegy Ellipta.  Patient has known sick contacts to grandkids, grandkids school function and church.  MMRC - Breathlessness Score 3 - I stop for breath after walking about 100 yards or after a few minutes on level ground (isle at grocery store is 151ft)   Tests:  11/26/2017-CT Angio-no PE, solitary 5 mm solid right upper lobe pulmonary nodule  12/01/2017-spirometry- FVC 1.9 (68% predicted), ratio 59, FEV1 54%  02/24/2018-echocardiogram-LV ejection fraction 60 to 33%, grade 2 diastolic dysfunction, concentric hypertrophy  FENO:  No results found for: NITRICOXIDE  PFT: No flowsheet data found.  Imaging: No results found.    Specialty Problems      Pulmonary Problems   COPD (chronic obstructive pulmonary disease) (HCC)    12/01/2017-spirometry- FVC 1.9 (68% predicted), ratio 59, FEV1 54%         Allergic rhinitis   Emphysema   Obstructive sleep apnea   Dyspnea   Pulmonary nodule    11/2017 47mm   11/26/2017-CT Angio-no PE, solitary 5 mm solid right upper lobe pulmonary nodule         Allergies  Allergen Reactions  . Food Shortness Of Breath    ALLERGY= MUSHROOMS  . Tequin Shortness Of Breath  . Codeine Nausea And Vomiting  . Erythromycin Other (See Comments)  GI UPSET  . Levaquin [Levofloxacin In D5w] Other (See Comments)    "made me want to die."  . Wellbutrin [Bupropion Hcl] Other (See Comments)    SHAKING  . Penicillins Rash  . Prednisone Rash    Immunization History  Administered Date(s) Administered  . Influenza Split 02/18/2011, 01/20/2012, 02/01/2013, 02/17/2014  . Influenza Whole 02/17/2017  . Influenza, High Dose Seasonal PF 03/05/2018  . Influenza,inj,Quad PF,6+ Mos 02/20/2015, 02/18/2016  .  Pneumococcal Conjugate-13 08/18/2013  . Pneumococcal Polysaccharide-23 05/20/2006  . Tdap 05/08/2017  . Zoster 05/21/2007  . Zoster Recombinat (Shingrix) 12/24/2017    Past Medical History:  Diagnosis Date  . Anxiety   . Asthma   . Atrial tachycardia ([place])   . Cataract   . COPD (chronic obstructive pulmonary disease) ([place])   . Diverticulosis   . GERD (gastroesophageal reflux disease)   . Hyperlipidemia   . Hypertension   . Insomnia   . Macular degeneration   . Migraines   . Neuropathy     Tobacco History: Social History   Tobacco Use  Smoking Status Former Smoker  . Packs/day: 1.00  . Years: 20.00  . Pack years: 20.00  . Types: Cigarettes  . Last attempt to quit: 05/21/1991  . Years since quitting: 26.9  Smokeless Tobacco Never Used   Counseling given: Not Answered  Continue to not smoke  Outpatient Encounter Medications as of 05/07/2018  Medication Sig  . acetaminophen (TYLENOL) 500 MG tablet Take 1,000 mg by mouth every 6 (six) hours as needed for moderate pain or headache.  . albuterol (PROVENTIL HFA;VENTOLIN HFA) 108 (90 Base) MCG/ACT inhaler Inhale 2 puffs into the lungs every 6 (six) hours as needed for wheezing or shortness of breath.  Marland Kitchen aspirin 81 MG tablet Take 81 mg by mouth daily.    Marland Kitchen aspirin-acetaminophen-caffeine (EXCEDRIN MIGRAINE) 250-250-65 MG per tablet Take 2 tablets by mouth every 6 (six) hours as needed for headache.  Marland Kitchen CARTIA XT 240 MG 24 hr capsule TAKE ONE CAPSULE BY MOUTH DAILY  . clobetasol cream (TEMOVATE) 1.49 % Apply 1 application topically daily as needed (yeast.).   Marland Kitchen FLUoxetine (PROZAC) 40 MG capsule Take 40 mg by mouth every morning.  . fluticasone (FLONASE) 50 MCG/ACT nasal spray Place 1 spray into both nostrils daily.  . Fluticasone-Umeclidin-Vilant (TRELEGY ELLIPTA) 100-62.5-25 MCG/INH AEPB Inhale 1 puff into the lungs daily.  . furosemide (LASIX) 20 MG tablet TAKE 1 TABLET BY MOUTH EVERY DAY AS NEEDED  . ibuprofen (ADVIL,MOTRIN)  200 MG tablet Take 400 mg by mouth every 6 (six) hours as needed for headache or moderate pain.  Marland Kitchen lactase (LACTAID) 3000 units tablet Take by mouth 3 (three) times daily with meals.  Marland Kitchen loratadine-pseudoephedrine (CLARITIN-D 12-HOUR) 5-120 MG per tablet Take 1 tablet by mouth daily.  Marland Kitchen LORazepam (ATIVAN) 0.5 MG tablet Take 0.5-1 mg by mouth daily as needed for anxiety. Anxiety  . montelukast (SINGULAIR) 10 MG tablet Take 10 mg by mouth daily as needed.   . Multiple Vitamins-Minerals (OCUVITE PO) Take 1 tablet by mouth 2 (two) times daily.   Marland Kitchen omeprazole (PRILOSEC) 20 MG capsule Take 20 mg by mouth Nightly.    . pravastatin (PRAVACHOL) 40 MG tablet Take 40 mg by mouth at bedtime.   . valsartan-hydrochlorothiazide (DIOVAN-HCT) 160-12.5 MG tablet Take 1 tablet by mouth daily.  . Fluticasone-Umeclidin-Vilant (TRELEGY ELLIPTA) 100-62.5-25 MCG/INH AEPB Inhale 1 puff into the lungs daily.  . [EXPIRED] methylPREDNISolone acetate (DEPO-MEDROL) injection 120 mg    No  facility-administered encounter medications on file as of 05/07/2018.      Review of Systems  Review of Systems  Constitutional: Positive for fatigue. Negative for chills, fever and unexpected weight change.  HENT: Positive for congestion and sore throat. Negative for ear pain, postnasal drip, sinus pressure and sinus pain.   Respiratory: Positive for cough (productive with green mucous ) and shortness of breath. Negative for chest tightness and wheezing.   Cardiovascular: Positive for leg swelling (Patient has not taken as needed Lasix lately). Negative for chest pain and palpitations.  Gastrointestinal: Negative for blood in stool, diarrhea, nausea and vomiting.  Genitourinary: Negative for dysuria, frequency and urgency.  Musculoskeletal: Negative for arthralgias.  Skin: Negative for color change.  Allergic/Immunologic: Positive for environmental allergies (Typically worse at spring as well as fall seasons). Negative for food  allergies.  Neurological: Negative for dizziness, light-headedness and headaches.  Psychiatric/Behavioral: Negative for dysphoric mood. The patient is not nervous/anxious.   All other systems reviewed and are negative.    Physical Exam  BP 118/62 (BP Location: Left Arm, Cuff Size: Normal)   Pulse 88   Temp 97.9 F (36.6 C)   Ht 5\' 2"  (1.575 m)   Wt 226 lb (102.5 kg)   SpO2 94%   BMI 41.34 kg/m   Wt Readings from Last 5 Encounters:  05/07/18 226 lb (102.5 kg)  03/05/18 226 lb (102.5 kg)  02/06/18 227 lb (103 kg)  12/01/17 225 lb 12.8 oz (102.4 kg)  11/26/17 230 lb 6.4 oz (104.5 kg)   Room air  Reweighed patient and her weight was: 226  Physical Exam  Constitutional: She is oriented to person, place, and time and well-developed, well-nourished, and in no distress. No distress.  HENT:  Head: Normocephalic and atraumatic.  Right Ear: Hearing, tympanic membrane, external ear and ear canal normal.  Left Ear: Hearing, tympanic membrane, external ear and ear canal normal.  Nose: Nose normal. Right sinus exhibits no maxillary sinus tenderness and no frontal sinus tenderness. Left sinus exhibits no maxillary sinus tenderness and no frontal sinus tenderness.  Mouth/Throat: Uvula is midline and oropharynx is clear and moist. No oropharyngeal exudate.  Eyes: Pupils are equal, round, and reactive to light.  Neck: Normal range of motion. Neck supple. No JVD present.  Cardiovascular: Normal rate, regular rhythm and normal heart sounds.  Pulmonary/Chest: Effort normal. No accessory muscle usage. No respiratory distress. She has no decreased breath sounds. She has wheezes (Expiratory wheeze right and left lower lobe in bases). She has no rhonchi.  Abdominal: Soft. Bowel sounds are normal. There is no abdominal tenderness.  Musculoskeletal: Normal range of motion.        General: Edema (2+ edema in lower extremities bilaterally) present.  Lymphadenopathy:    She has no cervical adenopathy.    Neurological: She is alert and oriented to person, place, and time. Gait normal.  Skin: Skin is warm and dry. She is not diaphoretic. No erythema.  Psychiatric: Mood, memory, affect and judgment normal.  Nursing note and vitals reviewed.     Lab Results:  CBC    Component Value Date/Time   WBC 8.2 05/22/2014 2030   RBC 4.29 05/22/2014 2030   HGB 10.1 (L) 05/22/2014 2030   HCT 33.8 (L) 05/22/2014 2030   PLT 278 05/22/2014 2030   MCV 78.8 05/22/2014 2030   MCH 23.5 (L) 05/22/2014 2030   MCHC 29.9 (L) 05/22/2014 2030   RDW 17.7 (H) 05/22/2014 2030    BMET  Component Value Date/Time   NA 140 11/26/2017 1545   K 4.2 11/26/2017 1545   CL 105 11/26/2017 1545   CO2 27 11/26/2017 1545   GLUCOSE 107 (H) 11/26/2017 1545   BUN 15 11/26/2017 1545   CREATININE 0.90 11/26/2017 1545   CALCIUM 9.1 11/26/2017 1545   GFRNONAA >90 05/22/2014 2030   GFRAA >90 05/22/2014 2030    BNP No results found for: BNP  ProBNP    Component Value Date/Time   PROBNP 118.0 (H) 11/26/2017 1545      Assessment & Plan:   72 year old female patient completing acute visit with our office today.  Patient with COPD exacerbation will do Depo-Medrol injection today.  Patient reports she tolerates these just fine she cannot tolerate prednisone taper so we will not treat that.  We will also provide Z-Pak.  Reviewed allergies with patient she is okay with tolerating Z-Paks.  Patient to keep follow-up with Dr. Lamonte Sakai in April/2020 we will also complete CT in July/2020 to follow pulmonary nodule.  Patient with lower extremity edema today.  Reweighed patient her weight is 226.  Patient to take Lasix when she gets home under direction of her cardiologist.  Patient to resume weighing herself daily.    COPD (chronic obstructive pulmonary disease) (HCC) Depo Medrol 120mg  today   Azithromycin 250mg  tablet  >>>Take 2 tablets (500mg  total) today, and then 1 tablet (250mg ) for the next four days  >>>take with  food  >>>can also take probiotic and / or yogurt while on antibiotic   Continue Trelegy Ellipta  >>> 1 puff daily in the morning >>>rinse mouth out after use   >>> This inhaler contains 3 medications that help manage her respiratory status, contact our office if you cannot afford this medication or unable to remain on this medication  Follow up with Dr Lamonte Sakai in April / 2020   Allergic rhinitis Continue Claritin, Flonase, Singulair Can start nasal saline rinses to help with allergic rhinitis symptoms  Pulmonary nodule Follow-up CT to be completed in July /2020  Edema of both lower extremities Take your as needed Lasix today  Do the following things EVERY DAY:   1. Weigh yourself EVERY morning after you go to the bathroom but before you eat or drink anything. Write this number down in a weight log/diary.    2. Take your medicines as prescribed. If you have concerns about your medications, please call us before you stop taking them.    3. Eat low salt foods-Limit salt (sodium) to 2000 mg per day. This will help prevent your body from holding onto fluid. Read food labels as many processed foods have a lot of sodium, especially canned goods and prepackaged meats. If you would like some assistance choosing low sodium foods, we would be happy to set you up with a nutritionist.   4. Stay as active as you can everyday. Staying active will give you more energy and make your muscles stronger. Start with 5 minutes at a time and work your way up to 30 minutes a day. Break up your activities--do some in the morning and some in the afternoon. Start with 3 days per week and work your way up to 5 days as you can.  If you have chest pain, feel short of breath, dizzy, or lightheaded, STOP. If you don't feel better after a short rest, call 911. If you do feel better, call the office to let us know you have symptoms with exercise.   5. Limit  all fluids for the day to less than 2 liters. Fluid includes all  drinks, coffee, juice, ice chips, soup, jello, and all other liquids.  Follow-up with cardiology if lower extremity swelling does not improve     Lauraine Rinne, NP 05/07/2018   This appointment was 34 minutes long with over 50% of the time in direct face-to-face patient care, assessment, plan of care, and follow-up.

## 2018-06-26 DIAGNOSIS — R51 Headache: Secondary | ICD-10-CM | POA: Diagnosis not present

## 2018-06-26 DIAGNOSIS — I10 Essential (primary) hypertension: Secondary | ICD-10-CM | POA: Diagnosis not present

## 2018-06-26 DIAGNOSIS — E785 Hyperlipidemia, unspecified: Secondary | ICD-10-CM | POA: Diagnosis not present

## 2018-06-26 DIAGNOSIS — J4520 Mild intermittent asthma, uncomplicated: Secondary | ICD-10-CM | POA: Diagnosis not present

## 2018-06-26 DIAGNOSIS — G629 Polyneuropathy, unspecified: Secondary | ICD-10-CM | POA: Diagnosis not present

## 2018-06-26 DIAGNOSIS — R29818 Other symptoms and signs involving the nervous system: Secondary | ICD-10-CM | POA: Diagnosis not present

## 2018-06-26 DIAGNOSIS — J449 Chronic obstructive pulmonary disease, unspecified: Secondary | ICD-10-CM | POA: Diagnosis not present

## 2018-06-26 DIAGNOSIS — F329 Major depressive disorder, single episode, unspecified: Secondary | ICD-10-CM | POA: Diagnosis not present

## 2018-07-07 DIAGNOSIS — R296 Repeated falls: Secondary | ICD-10-CM | POA: Diagnosis not present

## 2018-07-07 DIAGNOSIS — R293 Abnormal posture: Secondary | ICD-10-CM | POA: Diagnosis not present

## 2018-07-07 DIAGNOSIS — M6281 Muscle weakness (generalized): Secondary | ICD-10-CM | POA: Diagnosis not present

## 2018-07-07 DIAGNOSIS — R262 Difficulty in walking, not elsewhere classified: Secondary | ICD-10-CM | POA: Diagnosis not present

## 2018-07-10 DIAGNOSIS — R296 Repeated falls: Secondary | ICD-10-CM | POA: Diagnosis not present

## 2018-07-10 DIAGNOSIS — R293 Abnormal posture: Secondary | ICD-10-CM | POA: Diagnosis not present

## 2018-07-10 DIAGNOSIS — R262 Difficulty in walking, not elsewhere classified: Secondary | ICD-10-CM | POA: Diagnosis not present

## 2018-07-10 DIAGNOSIS — M6281 Muscle weakness (generalized): Secondary | ICD-10-CM | POA: Diagnosis not present

## 2018-07-13 DIAGNOSIS — R262 Difficulty in walking, not elsewhere classified: Secondary | ICD-10-CM | POA: Diagnosis not present

## 2018-07-13 DIAGNOSIS — M6281 Muscle weakness (generalized): Secondary | ICD-10-CM | POA: Diagnosis not present

## 2018-07-13 DIAGNOSIS — R293 Abnormal posture: Secondary | ICD-10-CM | POA: Diagnosis not present

## 2018-07-13 DIAGNOSIS — R296 Repeated falls: Secondary | ICD-10-CM | POA: Diagnosis not present

## 2018-07-15 DIAGNOSIS — R262 Difficulty in walking, not elsewhere classified: Secondary | ICD-10-CM | POA: Diagnosis not present

## 2018-07-15 DIAGNOSIS — R296 Repeated falls: Secondary | ICD-10-CM | POA: Diagnosis not present

## 2018-07-15 DIAGNOSIS — R293 Abnormal posture: Secondary | ICD-10-CM | POA: Diagnosis not present

## 2018-07-15 DIAGNOSIS — M6281 Muscle weakness (generalized): Secondary | ICD-10-CM | POA: Diagnosis not present

## 2018-07-17 DIAGNOSIS — R262 Difficulty in walking, not elsewhere classified: Secondary | ICD-10-CM | POA: Diagnosis not present

## 2018-07-17 DIAGNOSIS — R296 Repeated falls: Secondary | ICD-10-CM | POA: Diagnosis not present

## 2018-07-17 DIAGNOSIS — R293 Abnormal posture: Secondary | ICD-10-CM | POA: Diagnosis not present

## 2018-07-17 DIAGNOSIS — M6281 Muscle weakness (generalized): Secondary | ICD-10-CM | POA: Diagnosis not present

## 2018-07-20 DIAGNOSIS — R262 Difficulty in walking, not elsewhere classified: Secondary | ICD-10-CM | POA: Diagnosis not present

## 2018-07-20 DIAGNOSIS — R296 Repeated falls: Secondary | ICD-10-CM | POA: Diagnosis not present

## 2018-07-20 DIAGNOSIS — R293 Abnormal posture: Secondary | ICD-10-CM | POA: Diagnosis not present

## 2018-07-20 DIAGNOSIS — M6281 Muscle weakness (generalized): Secondary | ICD-10-CM | POA: Diagnosis not present

## 2018-07-21 ENCOUNTER — Encounter (INDEPENDENT_AMBULATORY_CARE_PROVIDER_SITE_OTHER): Payer: Medicare Other | Admitting: Ophthalmology

## 2018-07-21 DIAGNOSIS — H353112 Nonexudative age-related macular degeneration, right eye, intermediate dry stage: Secondary | ICD-10-CM | POA: Diagnosis not present

## 2018-07-21 DIAGNOSIS — I10 Essential (primary) hypertension: Secondary | ICD-10-CM | POA: Diagnosis not present

## 2018-07-21 DIAGNOSIS — H35033 Hypertensive retinopathy, bilateral: Secondary | ICD-10-CM

## 2018-07-21 DIAGNOSIS — H43813 Vitreous degeneration, bilateral: Secondary | ICD-10-CM | POA: Diagnosis not present

## 2018-07-21 DIAGNOSIS — H353221 Exudative age-related macular degeneration, left eye, with active choroidal neovascularization: Secondary | ICD-10-CM

## 2018-07-22 ENCOUNTER — Ambulatory Visit (INDEPENDENT_AMBULATORY_CARE_PROVIDER_SITE_OTHER): Payer: Medicare Other | Admitting: Primary Care

## 2018-07-22 ENCOUNTER — Ambulatory Visit: Payer: Medicare Other | Admitting: Emergency Medicine

## 2018-07-22 ENCOUNTER — Encounter: Payer: Self-pay | Admitting: Primary Care

## 2018-07-22 VITALS — BP 118/74 | HR 89 | Temp 98.9°F | Ht 62.0 in | Wt 233.0 lb

## 2018-07-22 DIAGNOSIS — R0602 Shortness of breath: Secondary | ICD-10-CM | POA: Diagnosis not present

## 2018-07-22 LAB — CBC WITH DIFFERENTIAL/PLATELET
Basophils Absolute: 0.1 10*3/uL (ref 0.0–0.1)
Basophils Relative: 1.2 % (ref 0.0–3.0)
Eosinophils Absolute: 0.2 10*3/uL (ref 0.0–0.7)
Eosinophils Relative: 2.5 % (ref 0.0–5.0)
HEMATOCRIT: 43.6 % (ref 36.0–46.0)
Hemoglobin: 14.5 g/dL (ref 12.0–15.0)
Lymphocytes Relative: 15.3 % (ref 12.0–46.0)
Lymphs Abs: 1.1 10*3/uL (ref 0.7–4.0)
MCHC: 33.4 g/dL (ref 30.0–36.0)
MCV: 83.3 fl (ref 78.0–100.0)
Monocytes Absolute: 0.8 10*3/uL (ref 0.1–1.0)
Monocytes Relative: 10.4 % (ref 3.0–12.0)
Neutro Abs: 5.3 10*3/uL (ref 1.4–7.7)
Neutrophils Relative %: 70.6 % (ref 43.0–77.0)
Platelets: 243 10*3/uL (ref 150.0–400.0)
RBC: 5.24 Mil/uL — ABNORMAL HIGH (ref 3.87–5.11)
RDW: 15.8 % — ABNORMAL HIGH (ref 11.5–15.5)
WBC: 7.5 10*3/uL (ref 4.0–10.5)

## 2018-07-22 LAB — RESPIRATORY VIRUS PANEL
HMPV: NOT DETECTED
Influenza A RNA: NOT DETECTED
Influenza B RNA: NOT DETECTED
RSV RNA: NOT DETECTED

## 2018-07-22 LAB — BRAIN NATRIURETIC PEPTIDE: Pro B Natriuretic peptide (BNP): 82 pg/mL (ref 0.0–100.0)

## 2018-07-22 MED ORDER — DOXYCYCLINE HYCLATE 100 MG PO TABS: 100.0000 mg | ORAL_TABLET | Freq: Two times a day (BID) | ORAL | 0 refills | Status: DC

## 2018-07-22 MED ORDER — METHYLPREDNISOLONE ACETATE 80 MG/ML IJ SUSP
120.0000 mg | Freq: Once | INTRAMUSCULAR | Status: AC
  Administered 2018-07-22: 120 mg via INTRAMUSCULAR

## 2018-07-22 MED ORDER — BENZONATATE 200 MG PO CAPS: 200.0000 mg | ORAL_CAPSULE | Freq: Three times a day (TID) | ORAL | 1 refills | Status: DC | PRN

## 2018-07-22 NOTE — Progress Notes (Addendum)
@Patient  ID: [name], female    DOB: 1946/04/10, 73 y.o.   MRN: [id number]  Chief Complaint  Patient presents with  . Acute Visit    cough x 3 weeks - greenish.  worse past 3 days.    Referring provider: Harlan Stains, MD  HPI: 73 year old female, former smoker quit in 1993 (20 pack-year history).  Past medical history significant for COPD/fixed asthma, chronic cough, allergic rhinitis, 5 mm right upper lobe pulmonary nodule (needs follow-up CT in July 2020).  Patient of Dr. Lamonte Sakai, last seen by pulmonary NP in December 2019.  Maintained on Trelegy, Singulair.   07/22/2018 Patient presents today for an acute visit with complaints of cough x2 weeks. Cough is occasionally productive with greenish sputum.Coughing for 2-3 hours. Gets a little short of breath with coughing fits. Possible fever yesterday. No travel or sick contacts. Flu negative.   Allergies  Allergen Reactions  . Food Shortness Of Breath    ALLERGY= MUSHROOMS  . Tequin Shortness Of Breath  . Codeine Nausea And Vomiting  . Erythromycin Other (See Comments)    GI UPSET  . Levaquin [Levofloxacin In D5w] Other (See Comments)    "made me want to die."  . Wellbutrin [Bupropion Hcl] Other (See Comments)    SHAKING  . Penicillins Rash  . Prednisone Rash    Immunization History  Administered Date(s) Administered  . Influenza Split 02/18/2011, 01/20/2012, 02/01/2013, 02/17/2014  . Influenza Whole 02/17/2017  . Influenza, High Dose Seasonal PF 03/05/2018  . Influenza,inj,Quad PF,6+ Mos 02/20/2015, 02/18/2016  . Pneumococcal Conjugate-13 08/18/2013  . Pneumococcal Polysaccharide-23 05/20/2006  . Tdap 05/08/2017  . Zoster 05/21/2007  . Zoster Recombinat (Shingrix) 12/24/2017    Past Medical History:  Diagnosis Date  . Anxiety   . Asthma   . Atrial tachycardia ([place])   . Cataract   . COPD (chronic obstructive pulmonary disease) ([place])   . Diverticulosis   . GERD (gastroesophageal reflux disease)   .  Hyperlipidemia   . Hypertension   . Insomnia   . Macular degeneration   . Migraines   . Neuropathy     Tobacco History: Social History   Tobacco Use  Smoking Status Former Smoker  . Packs/day: 1.00  . Years: 20.00  . Pack years: 20.00  . Types: Cigarettes  . Last attempt to quit: 05/21/1991  . Years since quitting: 27.1  Smokeless Tobacco Never Used   Counseling given: Not Answered   Outpatient Medications Prior to Visit  Medication Sig Dispense Refill  . acetaminophen (TYLENOL) 500 MG tablet Take 1,000 mg by mouth every 6 (six) hours as needed for moderate pain or headache.    . albuterol (PROVENTIL HFA;VENTOLIN HFA) 108 (90 Base) MCG/ACT inhaler Inhale 2 puffs into the lungs every 6 (six) hours as needed for wheezing or shortness of breath. 1 Inhaler 6  . aspirin 81 MG tablet Take 81 mg by mouth daily.      Marland Kitchen aspirin-acetaminophen-caffeine (EXCEDRIN MIGRAINE) 250-250-65 MG per tablet Take 2 tablets by mouth every 6 (six) hours as needed for headache.    Marland Kitchen CARTIA XT 240 MG 24 hr capsule TAKE ONE CAPSULE BY MOUTH DAILY 30 capsule 0  . clobetasol cream (TEMOVATE) 9.52 % Apply 1 application topically daily as needed (yeast.).     Marland Kitchen FLUoxetine (PROZAC) 40 MG capsule Take 40 mg by mouth every morning.  1  . fluticasone (FLONASE) 50 MCG/ACT nasal spray Place 1 spray into both nostrils daily. 16 g 5  .  Fluticasone-Umeclidin-Vilant (TRELEGY ELLIPTA) 100-62.5-25 MCG/INH AEPB Inhale 1 puff into the lungs daily. 1 each 5  . furosemide (LASIX) 20 MG tablet TAKE 1 TABLET BY MOUTH EVERY DAY AS NEEDED 90 tablet 2  . ibuprofen (ADVIL,MOTRIN) 200 MG tablet Take 400 mg by mouth every 6 (six) hours as needed for headache or moderate pain.    Marland Kitchen lactase (LACTAID) 3000 units tablet Take by mouth 3 (three) times daily with meals.    Marland Kitchen loratadine-pseudoephedrine (CLARITIN-D 12-HOUR) 5-120 MG per tablet Take 1 tablet by mouth daily.    Marland Kitchen LORazepam (ATIVAN) 0.5 MG tablet Take 0.5-1 mg by mouth daily as  needed for anxiety. Anxiety    . montelukast (SINGULAIR) 10 MG tablet Take 10 mg by mouth daily as needed.     . Multiple Vitamins-Minerals (OCUVITE PO) Take 1 tablet by mouth 2 (two) times daily.     Marland Kitchen omeprazole (PRILOSEC) 20 MG capsule Take 20 mg by mouth Nightly.      . pravastatin (PRAVACHOL) 40 MG tablet Take 40 mg by mouth at bedtime.     . tizanidine (ZANAFLEX) 2 MG capsule Take 2 mg by mouth at bedtime.    . valsartan-hydrochlorothiazide (DIOVAN-HCT) 160-12.5 MG tablet Take 1 tablet by mouth daily.    Marland Kitchen azithromycin (ZITHROMAX) 250 MG tablet 500mg  (two tablets) today, then 250mg  (1 tablet) for the next 4 days 6 tablet 0  . Fluticasone-Umeclidin-Vilant (TRELEGY ELLIPTA) 100-62.5-25 MCG/INH AEPB Inhale 1 puff into the lungs daily. (Patient not taking: Reported on 07/22/2018) 1 each 0   No facility-administered medications prior to visit.    Review of Systems  Review of Systems  HENT: Positive for postnasal drip.   Respiratory: Positive for cough and wheezing.    Physical Exam  BP 118/74 (BP Location: Right Arm, Patient Position: Sitting, Cuff Size: Normal)   Pulse 89   Temp 98.9 F (37.2 C)   Ht 5\' 2"  (1.575 m)   Wt 233 lb (105.7 kg)   SpO2 97%   BMI 42.62 kg/m  Physical Exam Constitutional:      Appearance: Normal appearance. She is not ill-appearing.  HENT:     Head: Normocephalic and atraumatic.     Right Ear: Tympanic membrane normal.     Left Ear: Tympanic membrane normal.     Mouth/Throat:     Mouth: Mucous membranes are moist.     Pharynx: Oropharynx is clear.  Eyes:     Extraocular Movements: Extraocular movements intact.     Conjunctiva/sclera: Conjunctivae normal.     Pupils: Pupils are equal, round, and reactive to light.  Cardiovascular:     Rate and Rhythm: Normal rate and regular rhythm.  Pulmonary:     Effort: Pulmonary effort is normal.     Breath sounds: Wheezing present.     Comments: Inspiratory wheeze to upper bilateral lobes Skin:     General: Skin is warm and dry.  Neurological:     General: No focal deficit present.     Mental Status: She is alert and oriented to person, place, and time. Mental status is at baseline.  Psychiatric:        Mood and Affect: Mood normal.        Behavior: Behavior normal.        Thought Content: Thought content normal.        Judgment: Judgment normal.      Lab Results:  CBC    Component Value Date/Time   WBC 7.5 07/22/2018 1542  RBC 5.24 (H) 07/22/2018 1542   HGB 14.5 07/22/2018 1542   HCT 43.6 07/22/2018 1542   PLT 243.0 07/22/2018 1542   MCV 83.3 07/22/2018 1542   MCH 23.5 (L) 05/22/2014 2030   MCHC 33.4 07/22/2018 1542   RDW 15.8 (H) 07/22/2018 1542   LYMPHSABS 1.1 07/22/2018 1542   MONOABS 0.8 07/22/2018 1542   EOSABS 0.2 07/22/2018 1542   BASOSABS 0.1 07/22/2018 1542    BMET    Component Value Date/Time   NA 140 11/26/2017 1545   K 4.2 11/26/2017 1545   CL 105 11/26/2017 1545   CO2 27 11/26/2017 1545   GLUCOSE 107 (H) 11/26/2017 1545   BUN 15 11/26/2017 1545   CREATININE 0.90 11/26/2017 1545   CALCIUM 9.1 11/26/2017 1545   GFRNONAA >90 05/22/2014 2030   GFRAA >90 05/22/2014 2030    BNP No results found for: BNP  ProBNP    Component Value Date/Time   PROBNP 82.0 07/22/2018 1542    Imaging: No results found.   Assessment & Plan:   COPD III B (based on July/2019 - [facility]) Testing: - Respiratory viral panel negative   Office treatment: - Depo-Medrol 120 mg IM x1 today  COPD exacerbation: - Doxycycline twice daily x7 days - Continue Trelegy 1 puff daily; Proventil hfa q4-6 hours prn sob/wheezing/coughing fits - Mucinex twice daily for congestion x7- 10 days - Tessalon Perles 1 tab every 8 hours as needed for cough   Follow-up: - Return/call if symptoms do not improve in 7 -10 days or if symptoms worsen in any way   Martyn Ehrich, NP 07/24/2018

## 2018-07-22 NOTE — Patient Instructions (Addendum)
  Testing: - Respiratory viral panel negative   Office treatment: - Depo-Medrol 120 mg IM x1 today  COPD exacerbation: - Doxycycline twice daily x7 days - Continue Trelegy 1 puff daily - Backup Proventil 2 puffs every 4-6 hours as needed for shortness of breath/wheezing/coughing fits - Mucinex twice daily for congestion x7- 10 days - Tessalon Perles 1 tab every 8 hours as needed for cough   Follow-up: - Return/call if symptoms do not improve in 7 -10 days or if symptoms worsen in any way

## 2018-07-24 DIAGNOSIS — R700 Elevated erythrocyte sedimentation rate: Secondary | ICD-10-CM | POA: Diagnosis not present

## 2018-07-24 NOTE — Assessment & Plan Note (Signed)
Testing: - Respiratory viral panel negative   Office treatment: - Depo-Medrol 120 mg IM x1 today  COPD exacerbation: - Doxycycline twice daily x7 days - Continue Trelegy 1 puff daily; Proventil hfa q4-6 hours prn sob/wheezing/coughing fits - Mucinex twice daily for congestion x7- 10 days - Tessalon Perles 1 tab every 8 hours as needed for cough   Follow-up: - Return/call if symptoms do not improve in 7 -10 days or if symptoms worsen in any way

## 2018-07-27 DIAGNOSIS — R296 Repeated falls: Secondary | ICD-10-CM | POA: Diagnosis not present

## 2018-07-27 DIAGNOSIS — R262 Difficulty in walking, not elsewhere classified: Secondary | ICD-10-CM | POA: Diagnosis not present

## 2018-07-27 DIAGNOSIS — M6281 Muscle weakness (generalized): Secondary | ICD-10-CM | POA: Diagnosis not present

## 2018-07-27 DIAGNOSIS — R293 Abnormal posture: Secondary | ICD-10-CM | POA: Diagnosis not present

## 2018-07-29 DIAGNOSIS — R296 Repeated falls: Secondary | ICD-10-CM | POA: Diagnosis not present

## 2018-07-29 DIAGNOSIS — M6281 Muscle weakness (generalized): Secondary | ICD-10-CM | POA: Diagnosis not present

## 2018-07-29 DIAGNOSIS — R262 Difficulty in walking, not elsewhere classified: Secondary | ICD-10-CM | POA: Diagnosis not present

## 2018-07-29 DIAGNOSIS — R293 Abnormal posture: Secondary | ICD-10-CM | POA: Diagnosis not present

## 2018-07-31 DIAGNOSIS — R293 Abnormal posture: Secondary | ICD-10-CM | POA: Diagnosis not present

## 2018-07-31 DIAGNOSIS — R262 Difficulty in walking, not elsewhere classified: Secondary | ICD-10-CM | POA: Diagnosis not present

## 2018-07-31 DIAGNOSIS — M6281 Muscle weakness (generalized): Secondary | ICD-10-CM | POA: Diagnosis not present

## 2018-07-31 DIAGNOSIS — R296 Repeated falls: Secondary | ICD-10-CM | POA: Diagnosis not present

## 2018-08-04 ENCOUNTER — Other Ambulatory Visit: Payer: Self-pay | Admitting: Emergency Medicine

## 2018-09-14 ENCOUNTER — Ambulatory Visit: Payer: Medicare Other | Admitting: Pulmonary Disease

## 2018-09-14 ENCOUNTER — Ambulatory Visit: Payer: Medicare Other | Admitting: Emergency Medicine

## 2018-09-16 NOTE — Progress Notes (Signed)
Virtual Visit via Telephone Note  I connected with [name] Molina on 09/17/18 at 11:00 AM EDT by telephone and verified that I am speaking with the correct person using two identifiers.   I discussed the limitations, risks, security and privacy concerns of performing an evaluation and management service by telephone and the availability of in person appointments. I also discussed with the patient that there may be a patient responsible charge related to this service. The patient expressed understanding and agreed to proceed.   History of Present Illness: 73 year old female former smoker followed in our office for COPD/fixed asthma, chronic cough, allergic rhinitis, 5 mm right upper lobe pulmonary nodule that we will follow on a CT in July 2020  PMH: GERD, edema in lower extremities Smoker/ Smoking History: Former smoker.  Quit 1993.  20-pack-year.  Maintenance: Trelegy Ellipta Pt of: Dr. Lamonte Sakai  Patient consented to consult via telephone: Yes  People present and their role in pt care: Pt   Chief complaint: COPD follow up   73 year old female former smoker followed in our office for COPD as well as a 5 mm right upper lobe pulmonary nodule that we are planning on following with a CT scan in July/2020.  Patient is maintained on Trelegy Ellipta.  Patient reports that she is been stable with her breathing since her last acute visit in our office in early March/2020.  Patient does admit that her lower extremities are swelling slightly and she plans on taking her as needed Lasix that she has from cardiology today.  She is not weighing herself.  Patient reports that she has to use her rescue inhaler about 2 times a week.  Patient is requesting a refill of her Trelegy Ellipta today.  MMRC - Breathlessness Score 2 - on level ground, I walk slower than people of the same age because of breathlessness, or have to stop for breathe when walking to my own pace    Observations/Objective:  11/26/2017-CT  Angio-no PE, solitary 5 mm solid right upper lobe pulmonary nodule  12/01/2017-spirometry- FVC 1.9 (68% predicted), ratio 59, FEV1 54%  02/24/2018-echocardiogram-LV ejection fraction 60 to 62%, grade 2 diastolic dysfunction, concentric hypertrophy   No results found for: NITRICOXIDE    Assessment and Plan:  COPD III B (based on July/2019 - spiro) Assessment: Maintained well on Trelegy Ellipta mMRC 2 today Patient needs full PFTs July/2019 spirometry showing COPD Gold 3  Plan: Continue Trelegy Ellipta Rescue inhaler as needed Patient needs full pulmonary function testing when COVID-19 restrictions are lifted 24-month follow-up with Dr. Lamonte Sakai Plan CT of chest to follow pulmonary nodule in July/2020  Pulmonary nodule Assessment: July/2019 CT angios showing a solitary 5 mm solid right upper lobe pulmonary nodule  Plan: Repeat CT chest without contrast in July/2020 I discussed with the patient today that this CT scan may be pushed back 2 to 3 months based off of COVID-19 pandemic, I am okay with that. patient needs imaging completed in 2020    Follow Up Instructions:  Return in about 4 months (around 01/17/2019), or if symptoms worsen or fail to improve, for Follow up with Dr. Lamonte Sakai, Follow up with Wyn Quaker FNP-C.    I discussed the assessment and treatment plan with the patient. The patient was provided an opportunity to ask questions and all were answered. The patient agreed with the plan and demonstrated an understanding of the instructions.   The patient was advised to call back or seek an in-person evaluation if the symptoms worsen  or if the condition fails to improve as anticipated.  I provided 24 minutes of non-face-to-face time during this encounter.   Lauraine Rinne, NP

## 2018-09-17 ENCOUNTER — Other Ambulatory Visit: Payer: Self-pay

## 2018-09-17 ENCOUNTER — Encounter: Payer: Self-pay | Admitting: Pulmonary Disease

## 2018-09-17 ENCOUNTER — Ambulatory Visit (INDEPENDENT_AMBULATORY_CARE_PROVIDER_SITE_OTHER): Payer: Medicare Other | Admitting: Pulmonary Disease

## 2018-09-17 DIAGNOSIS — R911 Solitary pulmonary nodule: Secondary | ICD-10-CM | POA: Diagnosis not present

## 2018-09-17 DIAGNOSIS — J449 Chronic obstructive pulmonary disease, unspecified: Secondary | ICD-10-CM | POA: Diagnosis not present

## 2018-09-17 DIAGNOSIS — R918 Other nonspecific abnormal finding of lung field: Secondary | ICD-10-CM

## 2018-09-17 MED ORDER — FLUTICASONE-UMECLIDIN-VILANT 100-62.5-25 MCG/INH IN AEPB: 1.0000 | INHALATION_SPRAY | Freq: Every day | RESPIRATORY_TRACT | 11 refills | Status: DC

## 2018-09-17 NOTE — Assessment & Plan Note (Signed)
Assessment: Maintained well on Trelegy Ellipta mMRC 2 today Patient needs full PFTs July/2019 spirometry showing COPD Gold 3  Plan: Continue Trelegy Ellipta Rescue inhaler as needed Patient needs full pulmonary function testing when COVID-19 restrictions are lifted 63-month follow-up with Dr. Lamonte Sakai Plan CT of chest to follow pulmonary nodule in July/2020

## 2018-09-17 NOTE — Assessment & Plan Note (Addendum)
Assessment: July/2019 CT angios showing a solitary 5 mm solid right upper lobe pulmonary nodule  Plan: Repeat CT chest without contrast in July/2020 I discussed with the patient today that this CT scan may be pushed back 2 to 3 months based off of COVID-19 pandemic, I am okay with that. patient needs imaging completed in 2020

## 2018-09-17 NOTE — Patient Instructions (Addendum)
Continue Trelegy Ellipta  >>> 1 puff daily in the morning >>>rinse mouth out after use   >>> This inhaler contains 3 medications that help manage her respiratory status, contact our office if you cannot afford this medication or unable to remain on this medication  We have refilled your Trelegy today   Only use your albuterol as a rescue medication to be used if you can't catch your breath by resting or doing a relaxed purse lip breathing pattern.  - The less you use it, the better it will work when you need it. - Ok to use up to 2 puffs  every 4 hours if you must but call for immediate appointment if use goes up over your usual need - Don't leave home without it !!  (think of it like the spare tire for your car)   Plan to repeat of CT of your chest in July/2020 This CT may be delayed a few months due to COVID-19 pandemic will have to wait to see  Return in about 4 months (around 01/17/2019), or if symptoms worsen or fail to improve, for Follow up with Dr. Lamonte Sakai, Follow up with Wyn Quaker FNP-C.  >>> You need a full 1 hour pulmonary function test to further evaluate your breathing once COVID-19 restrictions are lifted this can be completed prior to your next office visit on the same day  Take your Lasix today   Do the following things EVERY DAY:  1. Weigh yourself EVERY morning after you go to the bathroom but before you eat or drink anything. Write this number down in a weight log/diary.   2. Take your medicines as prescribed. If you have concerns about your medications, please call us before you stop taking them.   3. Eat low salt foods-Limit salt (sodium) to 2000 mg per day. This will help prevent your body from holding onto fluid. Read food labels as many processed foods have a lot of sodium, especially canned goods and prepackaged meats. If you would like some assistance choosing low sodium foods, we would be happy to set you up with a nutritionist.  4. Stay as active as you  can everyday. Staying active will give you more energy and make your muscles stronger. Start with 5 minutes at a time and work your way up to 30 minutes a day. Break up your activities--do some in the morning and some in the afternoon. Start with 3 days per week and work your way up to 5 days as you can. If you have chest pain, feel short of breath, dizzy, or lightheaded, STOP. If you don't feel better after a short rest, call 911. If you do feel better, call the office to let us know you have symptoms with exercise.  5. Limit all fluids for the day to less than 2 liters. Fluid includes all drinks, coffee, juice, ice chips, soup, jello, and all other liquids.    Coronavirus (COVID-19) Are you at risk?  Are you at risk for the Coronavirus (COVID-19)?  To be considered HIGH RISK for Coronavirus (COVID-19), you have to meet the following criteria:  . Traveled to [place], [place], [place], [place] or [place]; or in the [place] to [place], [place], [place], or [place]; and have fever, cough, and shortness of breath within the last 2 weeks of travel OR . Been in close contact with a person diagnosed with COVID-19 within the last 2 weeks and have fever, cough, and shortness of breath .  IF YOU DO NOT MEET THESE CRITERIA, YOU ARE CONSIDERED LOW RISK FOR COVID-19.  What to do if you are HIGH RISK for COVID-19?  Marland Kitchen If you are having a medical emergency, call 911. . Seek medical care right away. Before you go to a doctor's office, urgent care or emergency department, call ahead and tell them about your recent travel, contact with someone diagnosed with COVID-19, and your symptoms. You should receive instructions from your physician's office regarding next steps of care.  . When you arrive at healthcare provider, tell the healthcare staff immediately you have returned from visiting [place], [place], [place], [place] or [place]; or traveled in the [place] to [place], [place], [place],  or [place]; in the last two weeks or you have been in close contact with a person diagnosed with COVID-19 in the last 2 weeks.   . Tell the health care staff about your symptoms: fever, cough and shortness of breath. . After you have been seen by a medical provider, you will be either: o Tested for (COVID-19) and discharged home on quarantine except to seek medical care if symptoms worsen, and asked to  - Stay home and avoid contact with others until you get your results (4-5 days)  - Avoid travel on public transportation if possible (such as bus, train, or airplane) or o Sent to the Emergency Department by EMS for evaluation, COVID-19 testing, and possible admission depending on your condition and test results.  What to do if you are LOW RISK for COVID-19?  Reduce your risk of any infection by using the same precautions used for avoiding the common cold or flu:  Marland Kitchen Wash your hands often with soap and warm water for at least 20 seconds.  If soap and water are not readily available, use an alcohol-based hand sanitizer with at least 60% alcohol.  . If coughing or sneezing, cover your mouth and nose by coughing or sneezing into the elbow areas of your shirt or coat, into a tissue or into your sleeve (not your hands). . Avoid shaking hands with others and consider head nods or verbal greetings only. . Avoid touching your eyes, nose, or mouth with unwashed hands.  . Avoid close contact with people who are sick. . Avoid places or events with large numbers of people in one location, like concerts or sporting events. . Carefully consider travel plans you have or are making. . If you are planning any travel outside or inside the [place], visit the CDC's Travelers' Health webpage for the latest health notices. . If you have some symptoms but not all symptoms, continue to monitor at home and seek medical attention if your symptoms worsen. . If you are having a medical emergency, call 911.   [place] / e-Visit: [email or web address]         [facility]: [facility]: [phone]                   [facility]: [phone]           It is flu season:   >>> Best ways to protect herself from the flu: Receive the yearly flu vaccine, practice good hand hygiene washing with soap and also using hand sanitizer when available, eat a nutritious meals, get adequate rest, hydrate appropriately   Please contact the office if your symptoms worsen or you have concerns that you are  not improving.   Thank you for choosing [facility] for your healthcare, and for allowing [place] to partner with you on your healthcare journey. I am thankful to be able to provide care to you today.   Wyn Quaker FNP-C

## 2018-12-01 ENCOUNTER — Encounter (INDEPENDENT_AMBULATORY_CARE_PROVIDER_SITE_OTHER): Payer: Medicare Other | Admitting: Ophthalmology

## 2018-12-01 ENCOUNTER — Other Ambulatory Visit: Payer: Self-pay

## 2018-12-01 DIAGNOSIS — I10 Essential (primary) hypertension: Secondary | ICD-10-CM | POA: Diagnosis not present

## 2018-12-01 DIAGNOSIS — H35033 Hypertensive retinopathy, bilateral: Secondary | ICD-10-CM | POA: Diagnosis not present

## 2018-12-01 DIAGNOSIS — H353221 Exudative age-related macular degeneration, left eye, with active choroidal neovascularization: Secondary | ICD-10-CM

## 2018-12-01 DIAGNOSIS — H353112 Nonexudative age-related macular degeneration, right eye, intermediate dry stage: Secondary | ICD-10-CM

## 2018-12-01 DIAGNOSIS — H43813 Vitreous degeneration, bilateral: Secondary | ICD-10-CM

## 2018-12-16 ENCOUNTER — Ambulatory Visit (INDEPENDENT_AMBULATORY_CARE_PROVIDER_SITE_OTHER)
Admission: RE | Admit: 2018-12-16 | Discharge: 2018-12-16 | Disposition: A | Discharge status: Home / Self Care | Payer: Medicare Other | Source: Ambulatory Visit | Attending: Pulmonary Disease | Admitting: Pulmonary Disease

## 2018-12-16 ENCOUNTER — Other Ambulatory Visit: Payer: Self-pay

## 2018-12-16 DIAGNOSIS — R911 Solitary pulmonary nodule: Secondary | ICD-10-CM

## 2018-12-16 DIAGNOSIS — R918 Other nonspecific abnormal finding of lung field: Secondary | ICD-10-CM | POA: Diagnosis not present

## 2018-12-16 DIAGNOSIS — J984 Other disorders of lung: Secondary | ICD-10-CM | POA: Diagnosis not present

## 2018-12-17 NOTE — Progress Notes (Signed)
We have received your CT results.  The previously seen 5 mm nodule has resolved.  This is good news.  Wyn Quaker, FNP

## 2018-12-30 ENCOUNTER — Other Ambulatory Visit: Payer: Self-pay | Admitting: Emergency Medicine

## 2019-01-11 ENCOUNTER — Other Ambulatory Visit (HOSPITAL_COMMUNITY)
Admission: RE | Admit: 2019-01-11 | Discharge: 2019-01-11 | Disposition: A | Discharge status: Home / Self Care | Payer: Medicare Other | Source: Ambulatory Visit | Attending: Emergency Medicine | Admitting: Emergency Medicine

## 2019-01-11 DIAGNOSIS — Z20828 Contact with and (suspected) exposure to other viral communicable diseases: Secondary | ICD-10-CM | POA: Diagnosis not present

## 2019-01-11 DIAGNOSIS — Z01812 Encounter for preprocedural laboratory examination: Secondary | ICD-10-CM | POA: Diagnosis not present

## 2019-01-11 LAB — SARS CORONAVIRUS 2 (TAT 6-24 HRS): SARS Coronavirus 2: NEGATIVE

## 2019-01-14 DIAGNOSIS — L820 Inflamed seborrheic keratosis: Secondary | ICD-10-CM | POA: Diagnosis not present

## 2019-01-14 DIAGNOSIS — D229 Melanocytic nevi, unspecified: Secondary | ICD-10-CM | POA: Diagnosis not present

## 2019-01-15 ENCOUNTER — Ambulatory Visit (INDEPENDENT_AMBULATORY_CARE_PROVIDER_SITE_OTHER): Payer: Medicare Other | Admitting: Emergency Medicine

## 2019-01-15 ENCOUNTER — Other Ambulatory Visit: Payer: Self-pay

## 2019-01-15 ENCOUNTER — Encounter: Payer: Self-pay | Admitting: Emergency Medicine

## 2019-01-15 DIAGNOSIS — K219 Gastro-esophageal reflux disease without esophagitis: Secondary | ICD-10-CM

## 2019-01-15 DIAGNOSIS — J449 Chronic obstructive pulmonary disease, unspecified: Secondary | ICD-10-CM | POA: Diagnosis not present

## 2019-01-15 DIAGNOSIS — R911 Solitary pulmonary nodule: Secondary | ICD-10-CM

## 2019-01-15 DIAGNOSIS — J301 Allergic rhinitis due to pollen: Secondary | ICD-10-CM

## 2019-01-15 LAB — PULMONARY FUNCTION TEST
DL/VA % pred: 95 %
DL/VA: 3.94 ml/min/mmHg/L
DLCO unc % pred: 85 %
DLCO unc: 16.18 ml/min/mmHg
FEF 25-75 Post: 0.59 L/sec
FEF 25-75 Pre: 0.51 L/sec
FEF2575-%Change-Post: 15 %
FEF2575-%Pred-Post: 34 %
FEF2575-%Pred-Pre: 30 %
FEV1-%Change-Post: 6 %
FEV1-%Pred-Post: 61 %
FEV1-%Pred-Pre: 57 %
FEV1-Post: 1.3 L
FEV1-Pre: 1.22 L
FEV1FVC-%Change-Post: 4 %
FEV1FVC-%Pred-Pre: 75 %
FEV6-%Change-Post: 1 %
FEV6-%Pred-Post: 79 %
FEV6-%Pred-Pre: 78 %
FEV6-Post: 2.13 L
FEV6-Pre: 2.09 L
FEV6FVC-%Change-Post: 0 %
FEV6FVC-%Pred-Post: 101 %
FEV6FVC-%Pred-Pre: 101 %
FVC-%Change-Post: 1 %
FVC-%Pred-Post: 78 %
FVC-%Pred-Pre: 76 %
FVC-Post: 2.21 L
FVC-Pre: 2.17 L
Post FEV1/FVC ratio: 59 %
Post FEV6/FVC ratio: 96 %
Pre FEV1/FVC ratio: 56 %
Pre FEV6/FVC Ratio: 97 %
RV % pred: 118 %
RV: 2.67 L
TLC % pred: 96 %
TLC: 4.84 L

## 2019-01-15 NOTE — Patient Instructions (Signed)
Your pulmonary function testing is stable compared with priors. Please continue Trelegy 1 inhalation once daily.  Rinse and gargle after using. Keep your albuterol available use 2 puffs if needed for shortness of breath, chest tightness, wheezing. Continue your loratadine 10 mg daily. It is time for you to restart your Singulair 10 mg each evening.  You probably would benefit from using your fluticasone nasal spray 2 sprays each nostril once daily at least during the allergy season. Continue your omeprazole 20 mg daily. Your CT scan of the chest shows that the pulmonary nodule we had been following has resolved.  You do not need a repeat film for this. Pneumonia shot is up-to-date. Get the flu shot this fall. Follow with Dr Lamonte Sakai in 6 months or sooner if you have any problems

## 2019-01-15 NOTE — Assessment & Plan Note (Signed)
Your CT scan of the chest shows that the pulmonary nodule we had been following has resolved.  You do not need a repeat film for this.

## 2019-01-15 NOTE — Assessment & Plan Note (Signed)
Continue your loratadine 10 mg daily. It is time for you to restart your Singulair 10 mg each evening.  You probably would benefit from using your fluticasone nasal spray 2 sprays each nostril once daily at least during the allergy season.

## 2019-01-15 NOTE — Progress Notes (Signed)
Discussed at office visit with Dr. Lamonte Sakai today.  Nothing further is needed.

## 2019-01-15 NOTE — Progress Notes (Signed)
PFT done today. 

## 2019-01-15 NOTE — Assessment & Plan Note (Signed)
Continue your omeprazole 20 mg daily.

## 2019-01-15 NOTE — Assessment & Plan Note (Signed)
Your pulmonary function testing is stable compared with priors. Please continue Trelegy 1 inhalation once daily.  Rinse and gargle after using. Keep your albuterol available use 2 puffs if needed for shortness of breath, chest tightness, wheezing. Pneumonia shot is up-to-date. Get the flu shot this fall. Follow with Dr Lamonte Sakai in 6 months or sooner if you have any problems

## 2019-01-15 NOTE — Progress Notes (Signed)
Subjective:    Patient ID: [name] Molina, female    DOB: 10-09-1945, 73 y.o.   MRN: [id number] HPI  ROV 03/05/18 --[name] Molina is a 73, follows up today for COPD/fixed asthma, also has chronic cough in the setting of allergic rhinitis and GERD.  Also with PLMD.  She has a 5 mm right upper lobe pulmonary nodule that was seen on CT scan of the chest 11/26/2017.  Based on her insurance formulary we decided to try changing her to Trelegy in August.  She reports that she believes that it is working well. She is having some throat clearing, some raspy voice. She is on singulair, nasal steroid, loratadine. Her PNA shot is UTD, flu shot. She is using lasix prn per Dr Marlou Porch.   ROV 01/15/2019 --this is a follow-up visit for 73 year old woman with a history of COPD/fixed asthma, chronic cough with exacerbating factors of allergic rhinitis and GERD.  She also has a 5 mm right upper lobe pulmonary nodule that we have been following with serial imaging.  She was treated with antibiotics and steroids in March of this year for an acute exacerbation. Medications include Trelegy.  She has albuterol which she uses approximately once a week. She is on Singulair (during allergy season), loratadine (all year), fluticasone nasal spray prn, omeprazole 20 mg daily. Her exertional tolerance is stable, but limited by breathing. Does cook, no other housework.   A CT scan of her chest was done 12/17/2018 which I reviewed and which shows that the 5 mm right upper lobe nodule has resolved, consistent with inflammatory process.  She has some bibasilar bandlike scarring but no other relevant findings.  Repeat pulmonary function testing was done today which I reviewed, shows severe obstruction without a clinically significant bronchodilator response.  She has normal lung volumes and a normal diffusion capacity.  Her FEV1 is 1.22 L (57% predicted), stable compared with spirometry from July 2019.   No flowsheet data found.      Objective:   Physical Exam Vitals:   01/15/19 1110  BP: 134/88  Pulse: 81  SpO2: 94%  Weight: 232 lb (105.2 kg)  Height: 5' 3.75" (1.619 m)   Gen: Pleasant, overweight woman, in no distress,  normal affect, wheelchair  ENT: No lesions,  mouth clear,  oropharynx clear, no postnasal drip  Neck: No JVD, no stridor  Lungs: No use of accessory muscles, few scattered rhonchi, no wheeze  Cardiovascular: RRR, heart sounds normal, no murmur or gallops, trace L ankle peripheral edema  Musculoskeletal: no deformities  Neuro: alert, non focal  Skin: Warm, no lesions or rashes     Assessment & Plan:  COPD III B (based on July/2019 - spiro) Your pulmonary function testing is stable compared with priors. Please continue Trelegy 1 inhalation once daily.  Rinse and gargle after using. Keep your albuterol available use 2 puffs if needed for shortness of breath, chest tightness, wheezing. Pneumonia shot is up-to-date. Get the flu shot this fall. Follow with Dr Lamonte Sakai in 6 months or sooner if you have any problems  Allergic rhinitis Continue your loratadine 10 mg daily. It is time for you to restart your Singulair 10 mg each evening.  You probably would benefit from using your fluticasone nasal spray 2 sprays each nostril once daily at least during the allergy season.  GERD (gastroesophageal reflux disease) Continue your omeprazole 20 mg daily.   Pulmonary nodule Your CT scan of the chest shows that the pulmonary nodule we had been  following has resolved.  You do not need a repeat film for this.  Baltazar Apo, MD, PhD 01/15/2019, 11:31 AM [facility] [phone] or if no answer [phone]

## 2019-01-27 ENCOUNTER — Other Ambulatory Visit: Payer: Self-pay | Admitting: Cardiology

## 2019-02-21 ENCOUNTER — Other Ambulatory Visit: Payer: Self-pay | Admitting: Cardiology

## 2019-02-25 DIAGNOSIS — E785 Hyperlipidemia, unspecified: Secondary | ICD-10-CM | POA: Diagnosis not present

## 2019-02-25 DIAGNOSIS — J309 Allergic rhinitis, unspecified: Secondary | ICD-10-CM | POA: Diagnosis not present

## 2019-02-25 DIAGNOSIS — K219 Gastro-esophageal reflux disease without esophagitis: Secondary | ICD-10-CM | POA: Diagnosis not present

## 2019-02-25 DIAGNOSIS — J4520 Mild intermittent asthma, uncomplicated: Secondary | ICD-10-CM | POA: Diagnosis not present

## 2019-02-25 DIAGNOSIS — I10 Essential (primary) hypertension: Secondary | ICD-10-CM | POA: Diagnosis not present

## 2019-02-25 DIAGNOSIS — I471 Supraventricular tachycardia: Secondary | ICD-10-CM | POA: Diagnosis not present

## 2019-02-25 DIAGNOSIS — F329 Major depressive disorder, single episode, unspecified: Secondary | ICD-10-CM | POA: Diagnosis not present

## 2019-02-25 DIAGNOSIS — M109 Gout, unspecified: Secondary | ICD-10-CM | POA: Diagnosis not present

## 2019-02-25 DIAGNOSIS — Z0000 Encounter for general adult medical examination without abnormal findings: Secondary | ICD-10-CM | POA: Diagnosis not present

## 2019-02-25 DIAGNOSIS — G44209 Tension-type headache, unspecified, not intractable: Secondary | ICD-10-CM | POA: Diagnosis not present

## 2019-02-25 DIAGNOSIS — I700 Atherosclerosis of aorta: Secondary | ICD-10-CM | POA: Diagnosis not present

## 2019-03-07 ENCOUNTER — Other Ambulatory Visit: Payer: Self-pay | Admitting: Cardiology

## 2019-03-09 DIAGNOSIS — M109 Gout, unspecified: Secondary | ICD-10-CM | POA: Diagnosis not present

## 2019-03-09 DIAGNOSIS — E785 Hyperlipidemia, unspecified: Secondary | ICD-10-CM | POA: Diagnosis not present

## 2019-03-09 DIAGNOSIS — I10 Essential (primary) hypertension: Secondary | ICD-10-CM | POA: Diagnosis not present

## 2019-03-09 DIAGNOSIS — Z23 Encounter for immunization: Secondary | ICD-10-CM | POA: Diagnosis not present

## 2019-03-11 DIAGNOSIS — D509 Iron deficiency anemia, unspecified: Secondary | ICD-10-CM | POA: Diagnosis not present

## 2019-03-11 DIAGNOSIS — I10 Essential (primary) hypertension: Secondary | ICD-10-CM | POA: Diagnosis not present

## 2019-03-11 DIAGNOSIS — F329 Major depressive disorder, single episode, unspecified: Secondary | ICD-10-CM | POA: Diagnosis not present

## 2019-03-11 DIAGNOSIS — E785 Hyperlipidemia, unspecified: Secondary | ICD-10-CM | POA: Diagnosis not present

## 2019-03-11 DIAGNOSIS — J4520 Mild intermittent asthma, uncomplicated: Secondary | ICD-10-CM | POA: Diagnosis not present

## 2019-03-11 DIAGNOSIS — J449 Chronic obstructive pulmonary disease, unspecified: Secondary | ICD-10-CM | POA: Diagnosis not present

## 2019-03-11 DIAGNOSIS — M19042 Primary osteoarthritis, left hand: Secondary | ICD-10-CM | POA: Diagnosis not present

## 2019-03-11 DIAGNOSIS — M19041 Primary osteoarthritis, right hand: Secondary | ICD-10-CM | POA: Diagnosis not present

## 2019-04-20 ENCOUNTER — Encounter (INDEPENDENT_AMBULATORY_CARE_PROVIDER_SITE_OTHER): Payer: Medicare Other | Admitting: Ophthalmology

## 2019-04-20 DIAGNOSIS — H35033 Hypertensive retinopathy, bilateral: Secondary | ICD-10-CM

## 2019-04-20 DIAGNOSIS — H353221 Exudative age-related macular degeneration, left eye, with active choroidal neovascularization: Secondary | ICD-10-CM | POA: Diagnosis not present

## 2019-04-20 DIAGNOSIS — H43813 Vitreous degeneration, bilateral: Secondary | ICD-10-CM | POA: Diagnosis not present

## 2019-04-20 DIAGNOSIS — H353112 Nonexudative age-related macular degeneration, right eye, intermediate dry stage: Secondary | ICD-10-CM

## 2019-04-20 DIAGNOSIS — I10 Essential (primary) hypertension: Secondary | ICD-10-CM

## 2019-05-09 ENCOUNTER — Encounter (HOSPITAL_COMMUNITY): Payer: Self-pay

## 2019-05-09 ENCOUNTER — Emergency Department (HOSPITAL_COMMUNITY): Payer: Medicare Other

## 2019-05-09 ENCOUNTER — Emergency Department (HOSPITAL_COMMUNITY)
Admission: EM | Admit: 2019-05-09 | Discharge: 2019-05-09 | Disposition: A | Discharge status: Home / Self Care | Payer: Medicare Other | Attending: Emergency Medicine | Admitting: Emergency Medicine

## 2019-05-09 ENCOUNTER — Other Ambulatory Visit: Payer: Self-pay

## 2019-05-09 DIAGNOSIS — Z79899 Other long term (current) drug therapy: Secondary | ICD-10-CM | POA: Diagnosis not present

## 2019-05-09 DIAGNOSIS — J449 Chronic obstructive pulmonary disease, unspecified: Secondary | ICD-10-CM | POA: Insufficient documentation

## 2019-05-09 DIAGNOSIS — Y9389 Activity, other specified: Secondary | ICD-10-CM | POA: Insufficient documentation

## 2019-05-09 DIAGNOSIS — Z7982 Long term (current) use of aspirin: Secondary | ICD-10-CM | POA: Diagnosis not present

## 2019-05-09 DIAGNOSIS — Y92018 Other place in single-family (private) house as the place of occurrence of the external cause: Secondary | ICD-10-CM | POA: Diagnosis not present

## 2019-05-09 DIAGNOSIS — I10 Essential (primary) hypertension: Secondary | ICD-10-CM | POA: Diagnosis not present

## 2019-05-09 DIAGNOSIS — W010XXA Fall on same level from slipping, tripping and stumbling without subsequent striking against object, initial encounter: Secondary | ICD-10-CM | POA: Diagnosis not present

## 2019-05-09 DIAGNOSIS — Z87891 Personal history of nicotine dependence: Secondary | ICD-10-CM | POA: Insufficient documentation

## 2019-05-09 DIAGNOSIS — Y998 Other external cause status: Secondary | ICD-10-CM | POA: Diagnosis not present

## 2019-05-09 DIAGNOSIS — S81012A Laceration without foreign body, left knee, initial encounter: Secondary | ICD-10-CM | POA: Insufficient documentation

## 2019-05-09 DIAGNOSIS — S8992XA Unspecified injury of left lower leg, initial encounter: Secondary | ICD-10-CM | POA: Diagnosis present

## 2019-05-09 DIAGNOSIS — W19XXXA Unspecified fall, initial encounter: Secondary | ICD-10-CM

## 2019-05-09 MED ORDER — HYDROCODONE-ACETAMINOPHEN 5-325 MG PO TABS
2.0000 | ORAL_TABLET | Freq: Once | ORAL | Status: AC
  Administered 2019-05-09: 2 via ORAL
  Filled 2019-05-09: qty 2

## 2019-05-09 MED ORDER — LIDOCAINE-EPINEPHRINE (PF) 1 %-1:200000 IJ SOLN: 20.0000 mL | Freq: Once | INTRAMUSCULAR | Status: DC

## 2019-05-09 MED ORDER — BACITRACIN ZINC 500 UNIT/GM EX OINT
TOPICAL_OINTMENT | CUTANEOUS | Status: AC
  Administered 2019-05-09: 4
  Filled 2019-05-09: qty 3.6

## 2019-05-09 MED ORDER — HYDROCODONE-ACETAMINOPHEN 5-325 MG PO TABS: 1.0000 | ORAL_TABLET | Freq: Four times a day (QID) | ORAL | 0 refills | Status: DC | PRN

## 2019-05-09 MED ORDER — LIDOCAINE HCL 1 % IJ SOLN
INTRAMUSCULAR | Status: AC
  Administered 2019-05-09: 20 mL
  Filled 2019-05-09: qty 20

## 2019-05-09 MED ORDER — LIDOCAINE HCL 1 % IJ SOLN
INTRAMUSCULAR | Status: AC
  Administered 2019-05-09: 23:00:00 20 mL
  Filled 2019-05-09: qty 20

## 2019-05-09 NOTE — ED Notes (Signed)
5 mattress and 16 simple sutures. Wound dressed and ace wrap applied.

## 2019-05-09 NOTE — ED Provider Notes (Signed)
[facility] DEPT Provider Note   CSN: [id number] Arrival date & time: 05/09/19  1918     History Chief Complaint  Patient presents with  . Fall  . Extremity Laceration    [name] Molina is a 73 y.o. female.  HPI Patient was inside her home.  She reports she has poor balance and stumbled falling onto her left knee.  She reports she fell onto a carpeted floor.  There was no contamination.  She was able to break her fall.  She did not sustain any other injury.  She reports she does have a large cut on the knee that had bled a lot.  There is also a lot of stinging and burning around the knee.  She does not believe she put weight on it after the event.  She denies she struck her head.  No injury to her chest or abdomen.  She did not injure her hands or wrist and breaking her fall.    Past Medical History:  Diagnosis Date  . Anxiety   . Asthma   . Atrial tachycardia ([place])   . Cataract   . COPD (chronic obstructive pulmonary disease) ([place])   . Diverticulosis   . GERD (gastroesophageal reflux disease)   . Hyperlipidemia   . Hypertension   . Insomnia   . Macular degeneration   . Migraines   . Neuropathy     Patient Active Problem List   Diagnosis Date Noted  . Edema of both lower extremities 05/07/2018  . Pulmonary nodule 12/01/2017  . Dyspnea 11/26/2017  . RUQ abdominal pain 11/27/2015  . Suprapubic pain 11/27/2015  . Dysphagia 11/27/2015  . Abdominal mass, RUQ (right upper quadrant) 11/27/2015  . Obstructive sleep apnea 04/06/2015  . PAT (paroxysmal atrial tachycardia) ([place]) 08/09/2014  . GERD (gastroesophageal reflux disease)   . Hyperlipidemia   . Spinal stenosis of lumbar region 03/16/2014  . Idiopathic peripheral neuropathy 03/16/2014  . Hypertension 06/13/2011  . Allergic rhinitis 06/13/2011  . Chronic headaches 06/13/2011  . Emphysema 06/13/2011  . COPD III B (based on July/2019 - spiro) 04/25/2007    Past Surgical History:    Procedure Laterality Date  . CERVICAL POLYPECTOMY     INSIDE AND OUTSIDE  . COLONOSCOPY    . POLYPECTOMY    . REMOVED CARTLIAGE RIB       OB History   No obstetric history on file.     Family History  Problem Relation Age of Onset  . Colon polyps Sister 58       PRECANCEROUS POLYPS  . Breast cancer Maternal Grandmother 42  . Emphysema Father   . Asthma Father   . Heart disease Father   . Heart disease Paternal Grandfather   . Colon cancer Neg Hx     Social History   Tobacco Use  . Smoking status: Former Smoker    Packs/day: 1.00    Years: 20.00    Pack years: 20.00    Types: Cigarettes    Quit date: 05/21/1991    Years since quitting: 27.9  . Smokeless tobacco: Never Used  Substance Use Topics  . Alcohol use: No  . Drug use: No    Home Medications Prior to Admission medications   Medication Sig Start Date End Date Taking? Authorizing Provider  acetaminophen (TYLENOL) 500 MG tablet Take 1,000 mg by mouth every 6 (six) hours as needed for moderate pain or headache.    [provider]  albuterol (PROVENTIL HFA;VENTOLIN HFA)  108 (90 Base) MCG/ACT inhaler Inhale 2 puffs into the lungs every 6 (six) hours as needed for wheezing or shortness of breath. 03/05/18   Collene Gobble, MD  aspirin 81 MG tablet Take 81 mg by mouth daily.      [provider]  aspirin-acetaminophen-caffeine (EXCEDRIN MIGRAINE) [phone] MG per tablet Take 2 tablets by mouth every 6 (six) hours as needed for headache.    [provider]  benzonatate (TESSALON) 200 MG capsule Take 1 capsule (200 mg total) by mouth 3 (three) times daily as needed for cough. 07/22/18   Martyn Ehrich, NP  CARTIA XT 240 MG 24 hr capsule TAKE ONE CAPSULE BY MOUTH DAILY 03/19/16   Jerline Pain, MD  clobetasol cream (TEMOVATE) [id number] % Apply 1 application topically daily as needed (yeast.).  11/01/13   [provider]  FLUoxetine (PROZAC) 40 MG capsule Take 40 mg by mouth every  morning. 11/19/16   [provider]  fluticasone (FLONASE) 50 MCG/ACT nasal spray Place 1 spray into both nostrils daily. 07/19/16   Collene Gobble, MD  Fluticasone-Umeclidin-Vilant (TRELEGY ELLIPTA) 100-62.5-25 MCG/INH AEPB Take 1 puff by mouth daily. 09/17/18   Lauraine Rinne, NP  furosemide (LASIX) 20 MG tablet TAKE 1 TABLET (20 MG TOTAL) BY MOUTH DAILY AS NEEDED. PLEASE MAKE OVERDUE APPT WITH DR. Marlou Porch BEFORE ANYMORE REFILLS. 1ST ATTEMPT 02/23/19   Jerline Pain, MD  ibuprofen (ADVIL,MOTRIN) 200 MG tablet Take 400 mg by mouth every 6 (six) hours as needed for headache or moderate pain.    [provider]  lactase (LACTAID) 3000 units tablet Take by mouth 3 (three) times daily with meals.    [provider]  loratadine-pseudoephedrine (CLARITIN-D 12-HOUR) 5-120 MG per tablet Take 1 tablet by mouth daily.    [provider]  LORazepam (ATIVAN) 0.5 MG tablet Take 0.5-1 mg by mouth daily as needed for anxiety. Anxiety    [provider]  montelukast (SINGULAIR) 10 MG tablet Take 10 mg by mouth daily as needed.  02/21/14   [provider]  Multiple Vitamins-Minerals (OCUVITE PO) Take 1 tablet by mouth 2 (two) times daily.     [provider]  omeprazole (PRILOSEC) 20 MG capsule Take 20 mg by mouth Nightly.      [provider]  pravastatin (PRAVACHOL) 40 MG tablet Take 40 mg by mouth at bedtime.  09/04/10   [provider]  tizanidine (ZANAFLEX) 2 MG capsule Take 2 mg by mouth at bedtime.    [provider]  valsartan-hydrochlorothiazide (DIOVAN-HCT) 160-12.5 MG tablet Take 1 tablet by mouth daily.    [provider]    Allergies    Food, Tequin, Codeine, Erythromycin, Levaquin [levofloxacin in d5w], Wellbutrin [bupropion hcl], Penicillins, and Prednisone  Review of Systems   Review of Systems 10 Systems reviewed and are negative for acute change except as noted in the HPI.  Physical Exam Updated Vital  Signs BP 126/62   Pulse 82   Temp 98.1 F (36.7 C)   Resp 20   Ht 5' 2.5" (1.588 m)   Wt 102.1 kg   SpO2 94%   BMI 40.50 kg/m   Physical Exam Constitutional:      Comments: Patient is alert and appropriate.  No respiratory distress no confusion.  HENT:     Head: Normocephalic and atraumatic.  Eyes:     Extraocular Movements: Extraocular movements intact.  Cardiovascular:     Rate and Rhythm: Normal rate and  regular rhythm.  Pulmonary:     Effort: Pulmonary effort is normal.     Breath sounds: Normal breath sounds.  Chest:     Chest wall: No tenderness.  Abdominal:     General: There is no distension.     Palpations: Abdomen is soft.     Tenderness: There is no abdominal tenderness.  Musculoskeletal:     Comments: Large 18 cm laceration over the left knee.  See attached images.  Patient had pre-existing chronic swelling of the left ankle.  No deformity or new swelling.  No injury to the right lower extremity or upper extremities.  Skin:    General: Skin is warm and dry.  Neurological:     General: No focal deficit present.     Mental Status: She is oriented to person, place, and time.     Cranial Nerves: No cranial nerve deficit.     Coordination: Coordination normal.  Psychiatric:        Mood and Affect: Mood normal.           ED Results / Procedures / Treatments   Labs (all labs ordered are listed, but only abnormal results are displayed) Labs Reviewed - No data to display  EKG None  Radiology DG Knee Complete 4 Views Left  Result Date: 05/09/2019 CLINICAL DATA:  Fall with left leg laceration. EXAM: LEFT KNEE - COMPLETE 4+ VIEW COMPARISON:  07/05/2014 FINDINGS: No acute fracture, dislocation, or knee joint effusion is identified. Mild joint space narrowing and marginal osteophytosis are again seen in the medial compartment. There is irregularity of the infrapatellar soft tissues compatible with the history of soft tissue injury. No radiopaque foreign  body is identified. IMPRESSION: No acute osseous abnormality. Electronically Signed   By: Logan Bores M.D.   On: 05/09/2019 20:17    Procedures .Marland KitchenLaceration Repair  Date/Time: 05/09/2019 10:21 PM Performed by: Charlesetta Shanks, MD Authorized by: Charlesetta Shanks, MD   Consent:    Consent obtained:  Verbal   Consent given by:  Patient   Risks discussed:  Infection, need for additional repair, nerve damage, poor wound healing, poor cosmetic result, pain, tendon damage and vascular damage Anesthesia (see MAR for exact dosages):    Anesthesia method:  Local infiltration   Local anesthetic:  Lidocaine 1% w/o epi Laceration details:    Location:  Leg   Leg location:  L knee   Length (cm):  18   Depth (mm):  10 Exploration:    Hemostasis achieved with:  Direct pressure   Wound exploration: entire depth of wound probed and visualized     Wound extent: areolar tissue violated and fascia violated     Contaminated: no   Treatment:    Area cleansed with:  Saline and Shur-Clens   Amount of cleaning:  Extensive   Irrigation solution:  Sterile saline   Irrigation volume:  300 Skin repair:    Repair method:  Sutures   Suture size:  3-0   Suture material:  Nylon   Suture technique:  Horizontal mattress and simple interrupted   Number of sutures:  21 Approximation:    Approximation:  Close Post-procedure details:    Dressing:  Antibiotic ointment, non-adherent dressing and splint for protection   Patient tolerance of procedure:  Tolerated well, no immediate complications Comments:     Complex wound.  16 interrupted sutures and 5 mattress sutures placed.  Wound went from superficial medially to deep laterally.  Patient had extensive contusion and extensive subcutaneous  fat with thin, friable dermis.  I explored the wound at its lateral aspect where it was deep and went to the fascia surrounding the lateral aspect of the knee.  With sterile gloves I explored this area and did not find any  defect within the fascia surrounding the joint.  Mattress sutures used to relieve tension on the wound.  Good approximation see attached images.   (including critical care time)  Medications Ordered in ED Medications  lidocaine (XYLOCAINE) 1 % (with pres) injection (has no administration in time range)  lidocaine (XYLOCAINE) 1 % (with pres) injection (has no administration in time range)    ED Course  I have reviewed the triage vital signs and the nursing notes.  Pertinent labs & imaging results that were available during my care of the patient were reviewed by me and considered in my medical decision making (see chart for details).    MDM Rules/Calculators/A&P                      Laceration repaired as outlined above.  Patient is instructed she cannot have any flexion of the knee.  She should have assist with walker or cane.  Elevation and icing reviewed.  Patient to follow-up with orthopedics for wound care and further evaluation of the knee.  X-rays do not show any acute fracture. Final Clinical Impression(s) / ED Diagnoses Final diagnoses:  None    Rx / DC Orders ED Discharge Orders    None       Charlesetta Shanks, MD 05/09/19 2229

## 2019-05-09 NOTE — ED Triage Notes (Signed)
Pt BIB [facility] from home with c/o of a 4in laceration to the left leg below the knee after pt fell. Pt denies hitting head, LOC, back or neck pain. Pt takes ASA daily for A.Fib.

## 2019-05-09 NOTE — Discharge Instructions (Signed)
1.  Keep your dressing in place over your wound for the next 48 hours.  After that, you may clean the wound with mild, soapy water, rinse well pat dry and apply bacitracin ointment.  Cover your wound with a nonstick dressing. 2.  You may not bend your knee.  Bending your knee may result in tearing of your stitches.  Keep in immobilizer in place if you are doing any activities.  You may remove or open the immobilizer when knee is elevated and stable. 3.  Take 1-2 Vicodin tablets every 6 hours as needed for pain. 4.  For the next 48 hours he is a well wrapped ice pack and try to ice the knee for 20 minutes every couple hours. 5.  Make an appointment to follow-up with Raliegh Ip for reassessment of your knee and monitoring of your wound. 6.  Return to the emergency department if you have concerns, worsening condition or signs of wound infection.

## 2019-06-11 DIAGNOSIS — M19041 Primary osteoarthritis, right hand: Secondary | ICD-10-CM | POA: Diagnosis not present

## 2019-06-11 DIAGNOSIS — E785 Hyperlipidemia, unspecified: Secondary | ICD-10-CM | POA: Diagnosis not present

## 2019-06-11 DIAGNOSIS — M19042 Primary osteoarthritis, left hand: Secondary | ICD-10-CM | POA: Diagnosis not present

## 2019-06-11 DIAGNOSIS — D509 Iron deficiency anemia, unspecified: Secondary | ICD-10-CM | POA: Diagnosis not present

## 2019-06-11 DIAGNOSIS — I10 Essential (primary) hypertension: Secondary | ICD-10-CM | POA: Diagnosis not present

## 2019-06-11 DIAGNOSIS — F329 Major depressive disorder, single episode, unspecified: Secondary | ICD-10-CM | POA: Diagnosis not present

## 2019-06-11 DIAGNOSIS — J4520 Mild intermittent asthma, uncomplicated: Secondary | ICD-10-CM | POA: Diagnosis not present

## 2019-06-11 DIAGNOSIS — J449 Chronic obstructive pulmonary disease, unspecified: Secondary | ICD-10-CM | POA: Diagnosis not present

## 2019-07-16 DIAGNOSIS — J4520 Mild intermittent asthma, uncomplicated: Secondary | ICD-10-CM | POA: Diagnosis not present

## 2019-07-16 DIAGNOSIS — M19041 Primary osteoarthritis, right hand: Secondary | ICD-10-CM | POA: Diagnosis not present

## 2019-07-16 DIAGNOSIS — M19042 Primary osteoarthritis, left hand: Secondary | ICD-10-CM | POA: Diagnosis not present

## 2019-07-16 DIAGNOSIS — I10 Essential (primary) hypertension: Secondary | ICD-10-CM | POA: Diagnosis not present

## 2019-07-16 DIAGNOSIS — D509 Iron deficiency anemia, unspecified: Secondary | ICD-10-CM | POA: Diagnosis not present

## 2019-07-16 DIAGNOSIS — F329 Major depressive disorder, single episode, unspecified: Secondary | ICD-10-CM | POA: Diagnosis not present

## 2019-07-16 DIAGNOSIS — J449 Chronic obstructive pulmonary disease, unspecified: Secondary | ICD-10-CM | POA: Diagnosis not present

## 2019-07-16 DIAGNOSIS — E785 Hyperlipidemia, unspecified: Secondary | ICD-10-CM | POA: Diagnosis not present

## 2019-08-16 DIAGNOSIS — J449 Chronic obstructive pulmonary disease, unspecified: Secondary | ICD-10-CM | POA: Diagnosis not present

## 2019-08-16 DIAGNOSIS — E785 Hyperlipidemia, unspecified: Secondary | ICD-10-CM | POA: Diagnosis not present

## 2019-08-16 DIAGNOSIS — I10 Essential (primary) hypertension: Secondary | ICD-10-CM | POA: Diagnosis not present

## 2019-08-16 DIAGNOSIS — G43009 Migraine without aura, not intractable, without status migrainosus: Secondary | ICD-10-CM | POA: Diagnosis not present

## 2019-08-16 DIAGNOSIS — M19042 Primary osteoarthritis, left hand: Secondary | ICD-10-CM | POA: Diagnosis not present

## 2019-08-16 DIAGNOSIS — F329 Major depressive disorder, single episode, unspecified: Secondary | ICD-10-CM | POA: Diagnosis not present

## 2019-08-16 DIAGNOSIS — D509 Iron deficiency anemia, unspecified: Secondary | ICD-10-CM | POA: Diagnosis not present

## 2019-08-16 DIAGNOSIS — M19041 Primary osteoarthritis, right hand: Secondary | ICD-10-CM | POA: Diagnosis not present

## 2019-08-16 DIAGNOSIS — J4520 Mild intermittent asthma, uncomplicated: Secondary | ICD-10-CM | POA: Diagnosis not present

## 2019-08-24 DIAGNOSIS — G629 Polyneuropathy, unspecified: Secondary | ICD-10-CM | POA: Diagnosis not present

## 2019-08-24 DIAGNOSIS — M48061 Spinal stenosis, lumbar region without neurogenic claudication: Secondary | ICD-10-CM | POA: Diagnosis not present

## 2019-08-26 ENCOUNTER — Other Ambulatory Visit: Payer: Self-pay | Admitting: Family Medicine

## 2019-08-26 DIAGNOSIS — M48061 Spinal stenosis, lumbar region without neurogenic claudication: Secondary | ICD-10-CM

## 2019-08-27 ENCOUNTER — Other Ambulatory Visit: Payer: Self-pay | Admitting: Family Medicine

## 2019-08-27 DIAGNOSIS — M48061 Spinal stenosis, lumbar region without neurogenic claudication: Secondary | ICD-10-CM

## 2019-09-07 ENCOUNTER — Other Ambulatory Visit: Payer: Self-pay

## 2019-09-07 ENCOUNTER — Encounter (INDEPENDENT_AMBULATORY_CARE_PROVIDER_SITE_OTHER): Payer: Medicare Other | Admitting: Ophthalmology

## 2019-09-07 DIAGNOSIS — H353221 Exudative age-related macular degeneration, left eye, with active choroidal neovascularization: Secondary | ICD-10-CM | POA: Diagnosis not present

## 2019-09-07 DIAGNOSIS — H43813 Vitreous degeneration, bilateral: Secondary | ICD-10-CM | POA: Diagnosis not present

## 2019-09-07 DIAGNOSIS — H35033 Hypertensive retinopathy, bilateral: Secondary | ICD-10-CM | POA: Diagnosis not present

## 2019-09-07 DIAGNOSIS — H353112 Nonexudative age-related macular degeneration, right eye, intermediate dry stage: Secondary | ICD-10-CM | POA: Diagnosis not present

## 2019-09-07 DIAGNOSIS — I10 Essential (primary) hypertension: Secondary | ICD-10-CM

## 2019-09-12 ENCOUNTER — Other Ambulatory Visit: Payer: Self-pay

## 2019-09-12 ENCOUNTER — Emergency Department (HOSPITAL_COMMUNITY): Payer: Medicare Other

## 2019-09-12 ENCOUNTER — Inpatient Hospital Stay (HOSPITAL_COMMUNITY)
Admission: EM | Admit: 2019-09-12 | Discharge: 2019-09-18 | DRG: 190 | Disposition: A | Discharge status: Home Health | Payer: Medicare Other | Attending: Internal Medicine | Admitting: Internal Medicine

## 2019-09-12 ENCOUNTER — Encounter (HOSPITAL_COMMUNITY): Payer: Self-pay

## 2019-09-12 DIAGNOSIS — I471 Supraventricular tachycardia: Secondary | ICD-10-CM | POA: Diagnosis not present

## 2019-09-12 DIAGNOSIS — I5032 Chronic diastolic (congestive) heart failure: Secondary | ICD-10-CM | POA: Diagnosis not present

## 2019-09-12 DIAGNOSIS — Z6841 Body Mass Index (BMI) 40.0 and over, adult: Secondary | ICD-10-CM | POA: Diagnosis not present

## 2019-09-12 DIAGNOSIS — J9621 Acute and chronic respiratory failure with hypoxia: Secondary | ICD-10-CM | POA: Diagnosis present

## 2019-09-12 DIAGNOSIS — Z7982 Long term (current) use of aspirin: Secondary | ICD-10-CM

## 2019-09-12 DIAGNOSIS — Z87891 Personal history of nicotine dependence: Secondary | ICD-10-CM

## 2019-09-12 DIAGNOSIS — E782 Mixed hyperlipidemia: Secondary | ICD-10-CM | POA: Diagnosis present

## 2019-09-12 DIAGNOSIS — I480 Paroxysmal atrial fibrillation: Secondary | ICD-10-CM | POA: Diagnosis present

## 2019-09-12 DIAGNOSIS — G4733 Obstructive sleep apnea (adult) (pediatric): Secondary | ICD-10-CM | POA: Diagnosis present

## 2019-09-12 DIAGNOSIS — H3530 Unspecified macular degeneration: Secondary | ICD-10-CM | POA: Diagnosis present

## 2019-09-12 DIAGNOSIS — J9600 Acute respiratory failure, unspecified whether with hypoxia or hypercapnia: Secondary | ICD-10-CM | POA: Diagnosis present

## 2019-09-12 DIAGNOSIS — Z20822 Contact with and (suspected) exposure to covid-19: Secondary | ICD-10-CM | POA: Diagnosis present

## 2019-09-12 DIAGNOSIS — R000 Tachycardia, unspecified: Secondary | ICD-10-CM

## 2019-09-12 DIAGNOSIS — E785 Hyperlipidemia, unspecified: Secondary | ICD-10-CM | POA: Diagnosis present

## 2019-09-12 DIAGNOSIS — I4891 Unspecified atrial fibrillation: Secondary | ICD-10-CM | POA: Diagnosis present

## 2019-09-12 DIAGNOSIS — Z7951 Long term (current) use of inhaled steroids: Secondary | ICD-10-CM

## 2019-09-12 DIAGNOSIS — J9601 Acute respiratory failure with hypoxia: Secondary | ICD-10-CM | POA: Diagnosis not present

## 2019-09-12 DIAGNOSIS — Z79899 Other long term (current) drug therapy: Secondary | ICD-10-CM

## 2019-09-12 DIAGNOSIS — R0902 Hypoxemia: Secondary | ICD-10-CM

## 2019-09-12 DIAGNOSIS — Z8249 Family history of ischemic heart disease and other diseases of the circulatory system: Secondary | ICD-10-CM

## 2019-09-12 DIAGNOSIS — G4489 Other headache syndrome: Secondary | ICD-10-CM | POA: Diagnosis not present

## 2019-09-12 DIAGNOSIS — R0602 Shortness of breath: Secondary | ICD-10-CM | POA: Diagnosis not present

## 2019-09-12 DIAGNOSIS — Z881 Allergy status to other antibiotic agents status: Secondary | ICD-10-CM

## 2019-09-12 DIAGNOSIS — R05 Cough: Secondary | ICD-10-CM | POA: Diagnosis not present

## 2019-09-12 DIAGNOSIS — I110 Hypertensive heart disease with heart failure: Secondary | ICD-10-CM | POA: Diagnosis present

## 2019-09-12 DIAGNOSIS — I10 Essential (primary) hypertension: Secondary | ICD-10-CM | POA: Diagnosis present

## 2019-09-12 DIAGNOSIS — J441 Chronic obstructive pulmonary disease with (acute) exacerbation: Secondary | ICD-10-CM | POA: Diagnosis not present

## 2019-09-12 DIAGNOSIS — K219 Gastro-esophageal reflux disease without esophagitis: Secondary | ICD-10-CM | POA: Diagnosis present

## 2019-09-12 DIAGNOSIS — R509 Fever, unspecified: Secondary | ICD-10-CM | POA: Diagnosis not present

## 2019-09-12 DIAGNOSIS — D72829 Elevated white blood cell count, unspecified: Secondary | ICD-10-CM | POA: Diagnosis present

## 2019-09-12 DIAGNOSIS — Z209 Contact with and (suspected) exposure to unspecified communicable disease: Secondary | ICD-10-CM | POA: Diagnosis not present

## 2019-09-12 DIAGNOSIS — F329 Major depressive disorder, single episode, unspecified: Secondary | ICD-10-CM | POA: Diagnosis present

## 2019-09-12 LAB — RESPIRATORY PANEL BY RT PCR (FLU A&B, COVID)
Influenza A by PCR: NEGATIVE
Influenza B by PCR: NEGATIVE
SARS Coronavirus 2 by RT PCR: NEGATIVE

## 2019-09-12 LAB — CBC WITH DIFFERENTIAL/PLATELET
Abs Immature Granulocytes: 0.04 10*3/uL (ref 0.00–0.07)
Basophils Absolute: 0.1 10*3/uL (ref 0.0–0.1)
Basophils Relative: 1 %
Eosinophils Absolute: 0 10*3/uL (ref 0.0–0.5)
Eosinophils Relative: 0 %
HCT: 44.5 % (ref 36.0–46.0)
Hemoglobin: 14.2 g/dL (ref 12.0–15.0)
Immature Granulocytes: 1 %
Lymphocytes Relative: 7 %
Lymphs Abs: 0.5 10*3/uL — ABNORMAL LOW (ref 0.7–4.0)
MCH: 27.6 pg (ref 26.0–34.0)
MCHC: 31.9 g/dL (ref 30.0–36.0)
MCV: 86.4 fL (ref 80.0–100.0)
Monocytes Absolute: 0.8 10*3/uL (ref 0.1–1.0)
Monocytes Relative: 12 %
Neutro Abs: 5.3 10*3/uL (ref 1.7–7.7)
Neutrophils Relative %: 79 %
Platelets: 165 10*3/uL (ref 150–400)
RBC: 5.15 MIL/uL — ABNORMAL HIGH (ref 3.87–5.11)
RDW: 15.1 % (ref 11.5–15.5)
WBC: 6.6 10*3/uL (ref 4.0–10.5)
nRBC: 0 % (ref 0.0–0.2)

## 2019-09-12 LAB — BASIC METABOLIC PANEL
Anion gap: 10 (ref 5–15)
BUN: 10 mg/dL (ref 8–23)
CO2: 26 mmol/L (ref 22–32)
Calcium: 8.9 mg/dL (ref 8.9–10.3)
Chloride: 105 mmol/L (ref 98–111)
Creatinine, Ser: 0.67 mg/dL (ref 0.44–1.00)
GFR calc Af Amer: >60 mL/min (ref >60)
GFR calc non Af Amer: >60 mL/min (ref >60)
Glucose, Bld: 112 mg/dL — ABNORMAL HIGH (ref 70–99)
Potassium: 3.9 mmol/L (ref 3.5–5.1)
Sodium: 141 mmol/L (ref 135–145)

## 2019-09-12 LAB — POC SARS CORONAVIRUS 2 AG -  ED: SARS Coronavirus 2 Ag: NEGATIVE

## 2019-09-12 MED ORDER — ACETAMINOPHEN 500 MG PO TABS
1000.0000 mg | ORAL_TABLET | Freq: Four times a day (QID) | ORAL | Status: DC | PRN
  Administered 2019-09-13 – 2019-09-16 (×3): 1000 mg via ORAL
  Filled 2019-09-12 (×3): qty 2

## 2019-09-12 MED ORDER — FLUTICASONE PROPIONATE HFA 110 MCG/ACT IN AERO
1.0000 | INHALATION_SPRAY | Freq: Two times a day (BID) | RESPIRATORY_TRACT | Status: DC
  Administered 2019-09-13 – 2019-09-18 (×11): 1 via RESPIRATORY_TRACT
  Filled 2019-09-12: qty 12

## 2019-09-12 MED ORDER — ASPIRIN-ACETAMINOPHEN-CAFFEINE 250-250-65 MG PO TABS
2.0000 | ORAL_TABLET | Freq: Four times a day (QID) | ORAL | Status: DC | PRN
  Filled 2019-09-12: qty 2

## 2019-09-12 MED ORDER — SODIUM CHLORIDE 0.9 % IV BOLUS
500.0000 mL | Freq: Once | INTRAVENOUS | Status: AC
  Administered 2019-09-12: 500 mL via INTRAVENOUS

## 2019-09-12 MED ORDER — FUROSEMIDE 20 MG PO TABS: 20.0000 mg | ORAL_TABLET | Freq: Every day | ORAL | Status: DC | PRN

## 2019-09-12 MED ORDER — ASPIRIN EC 81 MG PO TBEC
81.0000 mg | DELAYED_RELEASE_TABLET | Freq: Every day | ORAL | Status: DC
  Administered 2019-09-13 – 2019-09-18 (×6): 81 mg via ORAL
  Filled 2019-09-12 (×6): qty 1

## 2019-09-12 MED ORDER — SODIUM CHLORIDE 0.9 % IV SOLN
2.0000 g | Freq: Three times a day (TID) | INTRAVENOUS | Status: DC
  Administered 2019-09-12 – 2019-09-14 (×6): 2 g via INTRAVENOUS
  Filled 2019-09-12 (×7): qty 2

## 2019-09-12 MED ORDER — IBUPROFEN 200 MG PO TABS: 400.0000 mg | ORAL_TABLET | Freq: Four times a day (QID) | ORAL | Status: DC | PRN

## 2019-09-12 MED ORDER — SODIUM CHLORIDE 0.9 % IV SOLN: INTRAVENOUS | Status: DC

## 2019-09-12 MED ORDER — VALSARTAN-HYDROCHLOROTHIAZIDE 160-12.5 MG PO TABS: 0.5000 | ORAL_TABLET | Freq: Every day | ORAL | Status: DC

## 2019-09-12 MED ORDER — ONDANSETRON HCL 4 MG/2ML IJ SOLN: 4.0000 mg | Freq: Four times a day (QID) | INTRAMUSCULAR | Status: DC | PRN

## 2019-09-12 MED ORDER — METHYLPREDNISOLONE SODIUM SUCC 125 MG IJ SOLR
60.0000 mg | Freq: Two times a day (BID) | INTRAMUSCULAR | Status: AC
  Administered 2019-09-12 – 2019-09-14 (×5): 60 mg via INTRAVENOUS
  Filled 2019-09-12 (×5): qty 2

## 2019-09-12 MED ORDER — PRAVASTATIN SODIUM 40 MG PO TABS
40.0000 mg | ORAL_TABLET | Freq: Every day | ORAL | Status: DC
  Administered 2019-09-12 – 2019-09-17 (×6): 40 mg via ORAL
  Filled 2019-09-12 (×6): qty 1

## 2019-09-12 MED ORDER — ENSURE ENLIVE PO LIQD
237.0000 mL | Freq: Two times a day (BID) | ORAL | Status: DC
  Administered 2019-09-14 – 2019-09-16 (×3): 237 mL via ORAL

## 2019-09-12 MED ORDER — ENOXAPARIN SODIUM 40 MG/0.4ML ~~LOC~~ SOLN
40.0000 mg | Freq: Every day | SUBCUTANEOUS | Status: DC
  Administered 2019-09-12 – 2019-09-14 (×3): 40 mg via SUBCUTANEOUS
  Filled 2019-09-12 (×3): qty 0.4

## 2019-09-12 MED ORDER — MAGNESIUM SULFATE 2 GM/50ML IV SOLN
2.0000 g | Freq: Once | INTRAVENOUS | Status: AC
  Administered 2019-09-12: 2 g via INTRAVENOUS
  Filled 2019-09-12: qty 50

## 2019-09-12 MED ORDER — UMECLIDINIUM-VILANTEROL 62.5-25 MCG/INH IN AEPB
1.0000 | INHALATION_SPRAY | Freq: Every day | RESPIRATORY_TRACT | Status: DC
  Administered 2019-09-13 – 2019-09-17 (×5): 1 via RESPIRATORY_TRACT
  Filled 2019-09-12: qty 14

## 2019-09-12 MED ORDER — CLOBETASOL PROPIONATE 0.05 % EX CREA
1.0000 "application " | TOPICAL_CREAM | Freq: Every day | CUTANEOUS | Status: DC | PRN
  Filled 2019-09-12: qty 15

## 2019-09-12 MED ORDER — HYDROCHLOROTHIAZIDE 12.5 MG PO CAPS
12.5000 mg | ORAL_CAPSULE | Freq: Every day | ORAL | Status: DC
  Administered 2019-09-13: 12.5 mg via ORAL
  Filled 2019-09-12: qty 1

## 2019-09-12 MED ORDER — ALBUTEROL SULFATE HFA 108 (90 BASE) MCG/ACT IN AERS: 2.0000 | INHALATION_SPRAY | Freq: Four times a day (QID) | RESPIRATORY_TRACT | Status: DC | PRN

## 2019-09-12 MED ORDER — LORATADINE 10 MG PO TABS
5.0000 mg | ORAL_TABLET | Freq: Every day | ORAL | Status: DC
  Administered 2019-09-13 – 2019-09-18 (×6): 5 mg via ORAL
  Filled 2019-09-12 (×6): qty 1

## 2019-09-12 MED ORDER — TIZANIDINE HCL 4 MG PO TABS: 2.0000 mg | ORAL_TABLET | Freq: Every evening | ORAL | Status: DC | PRN

## 2019-09-12 MED ORDER — ACETAMINOPHEN 325 MG PO TABS
650.0000 mg | ORAL_TABLET | Freq: Once | ORAL | Status: AC
  Administered 2019-09-12: 650 mg via ORAL
  Filled 2019-09-12: qty 2

## 2019-09-12 MED ORDER — LORAZEPAM 0.5 MG PO TABS: 0.5000 mg | ORAL_TABLET | Freq: Every day | ORAL | Status: DC | PRN

## 2019-09-12 MED ORDER — ALBUTEROL (5 MG/ML) CONTINUOUS INHALATION SOLN
10.0000 mg/h | INHALATION_SOLUTION | Freq: Once | RESPIRATORY_TRACT | Status: AC
  Administered 2019-09-12: 10 mg/h via RESPIRATORY_TRACT
  Filled 2019-09-12: qty 20

## 2019-09-12 MED ORDER — IRBESARTAN 150 MG PO TABS
75.0000 mg | ORAL_TABLET | Freq: Every day | ORAL | Status: DC
  Administered 2019-09-13 – 2019-09-18 (×6): 75 mg via ORAL
  Filled 2019-09-12 (×6): qty 1

## 2019-09-12 MED ORDER — LACTASE 3000 UNITS PO TABS
3000.0000 [IU] | ORAL_TABLET | Freq: Three times a day (TID) | ORAL | Status: DC | PRN
  Filled 2019-09-12: qty 1

## 2019-09-12 MED ORDER — MONTELUKAST SODIUM 10 MG PO TABS
10.0000 mg | ORAL_TABLET | Freq: Every day | ORAL | Status: DC
  Administered 2019-09-12 – 2019-09-17 (×6): 10 mg via ORAL
  Filled 2019-09-12 (×6): qty 1

## 2019-09-12 MED ORDER — PANTOPRAZOLE SODIUM 40 MG PO TBEC
40.0000 mg | DELAYED_RELEASE_TABLET | Freq: Every day | ORAL | Status: DC
  Administered 2019-09-13 – 2019-09-18 (×6): 40 mg via ORAL
  Filled 2019-09-12 (×6): qty 1

## 2019-09-12 MED ORDER — ONDANSETRON HCL 4 MG PO TABS: 4.0000 mg | ORAL_TABLET | Freq: Four times a day (QID) | ORAL | Status: DC | PRN

## 2019-09-12 MED ORDER — FLUOXETINE HCL 20 MG PO CAPS
40.0000 mg | ORAL_CAPSULE | Freq: Every morning | ORAL | Status: DC
  Administered 2019-09-13 – 2019-09-18 (×6): 40 mg via ORAL
  Filled 2019-09-12 (×6): qty 2

## 2019-09-12 MED ORDER — DILTIAZEM HCL ER COATED BEADS 240 MG PO CP24
240.0000 mg | ORAL_CAPSULE | Freq: Every day | ORAL | Status: DC
  Administered 2019-09-13 – 2019-09-14 (×2): 240 mg via ORAL
  Filled 2019-09-12 (×2): qty 1

## 2019-09-12 MED ORDER — METHYLPREDNISOLONE SODIUM SUCC 125 MG IJ SOLR
125.0000 mg | Freq: Once | INTRAMUSCULAR | Status: AC
  Administered 2019-09-12: 125 mg via INTRAVENOUS
  Filled 2019-09-12: qty 2

## 2019-09-12 NOTE — H&P (Signed)
History and Physical   DANDREA VIDES [id number] DOB: Sep 10, 1945 DOA: 09/12/2019  Referring MD/NP/PA: Dr. Eulis Foster  PCP: Harlan Stains, MD   Outpatient Specialists: Baltazar Apo, MD, pulmonology  Patient coming from: Home  Chief Complaint: Shortness of breath  HPI: [name] Molina is a 74 y.o. female with medical history significant of COPD, asthma, GERD, hyperlipidemia, hypertension, macular degeneration among other things who came to the ER with progressive shortness of breath cough and wheezing.  Patient received a [employer] Covid vaccine 2 weeks ago.  She noticed nonproductive cough with nasal drainage in the last 3 days.  She denied any loss of taste or smell.  Denied any chest pain.  No abdominal pain.  Patient was notably wheezing.  She was seen in the ER and treated with nebulized treatments but no relief after 3 treatments.  She is being admitted to the hospital for acute exacerbation of COPD with hypoxia..  ED Course: Temperature 100.8 blood pressure 163/99 pulse 134 respirate of 26 oxygen sat 86% on room air and 98% on 2 L.  Chemistry appears to be within normal CBC also mostly normal.  COVID-19 PCR negative.  Chest x-ray showed no acute findings.  Patient being admitted for full treatment of COPD exacerbation.  Review of Systems: As per HPI otherwise 10 point review of systems negative.    Past Medical History:  Diagnosis Date  . Anxiety   . Asthma   . Atrial tachycardia ([place])   . Cataract   . COPD (chronic obstructive pulmonary disease) ([place])   . Diverticulosis   . GERD (gastroesophageal reflux disease)   . Hyperlipidemia   . Hypertension   . Insomnia   . Macular degeneration   . Migraines   . Neuropathy     Past Surgical History:  Procedure Laterality Date  . CERVICAL POLYPECTOMY     INSIDE AND OUTSIDE  . COLONOSCOPY    . POLYPECTOMY    . REMOVED CARTLIAGE RIB       reports that she quit smoking about 28 years ago. Her smoking use included cigarettes.  She has a 20.00 pack-year smoking history. She has never used smokeless tobacco. She reports that she does not drink alcohol or use drugs.  Allergies  Allergen Reactions  . Food Shortness Of Breath    ALLERGY= MUSHROOMS  . Tequin Shortness Of Breath  . Codeine Nausea And Vomiting  . Erythromycin Other (See Comments)    GI UPSET  . Levaquin [Levofloxacin In D5w] Other (See Comments)    "made me want to die."  . Wellbutrin [Bupropion Hcl] Other (See Comments)    SHAKING  . Penicillins Rash  . Prednisone Rash    Family History  Problem Relation Age of Onset  . Colon polyps Sister 84       PRECANCEROUS POLYPS  . Breast cancer Maternal Grandmother 20  . Emphysema Father   . Asthma Father   . Heart disease Father   . Heart disease Paternal Grandfather   . Colon cancer Neg Hx      Prior to Admission medications   Medication Sig Start Date End Date Taking? Authorizing Provider  acetaminophen (TYLENOL) 500 MG tablet Take 1,000 mg by mouth every 6 (six) hours as needed for moderate pain or headache.   Yes [provider]  albuterol (PROVENTIL HFA;VENTOLIN HFA) 108 (90 Base) MCG/ACT inhaler Inhale 2 puffs into the lungs every 6 (six) hours as needed for wheezing or shortness of breath. 03/05/18  Yes  Collene Gobble, MD  aspirin 81 MG tablet Take 81 mg by mouth daily.     Yes [provider]  aspirin-acetaminophen-caffeine (EXCEDRIN MIGRAINE) [phone] MG per tablet Take 2 tablets by mouth every 6 (six) hours as needed for headache.   Yes [provider]  BESIVANCE 0.6 % SUSP Place 1 drop into the left eye See admin instructions. Instill 1 drop qid day of eye injection and qid the day after. 09/07/19  Yes [provider]  CARTIA XT 240 MG 24 hr capsule TAKE ONE CAPSULE BY MOUTH DAILY Patient taking differently: Take 240 mg by mouth daily.  03/19/16  Yes Jerline Pain, MD  clobetasol cream (TEMOVATE) [id number] % Apply 1 application topically daily as needed  (yeast.).  11/01/13  Yes [provider]  FLUoxetine (PROZAC) 40 MG capsule Take 40 mg by mouth every morning. 11/19/16  Yes [provider]  furosemide (LASIX) 20 MG tablet TAKE 1 TABLET (20 MG TOTAL) BY MOUTH DAILY AS NEEDED. PLEASE MAKE OVERDUE APPT WITH DR. Marlou Porch BEFORE ANYMORE REFILLS. 1ST ATTEMPT Patient taking differently: Take 20 mg by mouth daily as needed for fluid. Please make overdue appt with Dr. Marlou Porch before anymore refills. 1st attempt 02/23/19  Yes Jerline Pain, MD  ibuprofen (ADVIL,MOTRIN) 200 MG tablet Take 400 mg by mouth every 6 (six) hours as needed for headache or moderate pain.   Yes [provider]  lactase (LACTAID) 3000 units tablet Take 3,000 Units by mouth 3 (three) times daily as needed (lactose ingestion).    Yes [provider]  loratadine-pseudoephedrine (CLARITIN-D 12-HOUR) 5-120 MG per tablet Take 1 tablet by mouth daily.   Yes [provider]  LORazepam (ATIVAN) 0.5 MG tablet Take 0.5-1 mg by mouth daily as needed for anxiety. Anxiety   Yes [provider]  montelukast (SINGULAIR) 10 MG tablet Take 10 mg by mouth daily as needed.  02/21/14  Yes [provider]  Multiple Vitamins-Minerals (OCUVITE PO) Take 1 tablet by mouth 2 (two) times daily.    Yes [provider]  omeprazole (PRILOSEC) 20 MG capsule Take 20 mg by mouth at bedtime.    Yes [provider]  pravastatin (PRAVACHOL) 40 MG tablet Take 40 mg by mouth at bedtime.  09/04/10  Yes [provider]  tizanidine (ZANAFLEX) 2 MG capsule Take 2 mg by mouth at bedtime as needed for muscle spasms (head pain per pt).    Yes [provider]  valsartan-hydrochlorothiazide (DIOVAN-HCT) 160-12.5 MG tablet Take 0.5 tablets by mouth daily.    Yes [provider]  benzonatate (TESSALON) 200 MG capsule Take 1 capsule (200 mg total) by mouth 3 (three) times daily as needed for cough. Patient not taking: Reported on  09/12/2019 07/22/18   Martyn Ehrich, NP  fluticasone [facility]) 50 MCG/ACT nasal spray Place 1 spray into both nostrils daily. Patient not taking: Reported on 09/12/2019 07/19/16   Collene Gobble, MD  Fluticasone-Umeclidin-Vilant (TRELEGY ELLIPTA) 100-62.5-25 MCG/INH AEPB Take 1 puff by mouth daily. 09/17/18   Lauraine Rinne, NP  HYDROcodone-acetaminophen (NORCO/VICODIN) 5-325 MG tablet Take 1-2 tablets by mouth every 6 (six) hours as needed for moderate pain or severe pain. Patient not taking: Reported on 09/12/2019 05/09/19   Charlesetta Shanks, MD    Physical Exam: Vitals:   09/12/19 1730 09/12/19 1800 09/12/19 1830 09/12/19 1900  BP: (!) 114/54 118/71 117/71 (!) 104/47  Pulse: (!) 134 (!) 127 (!) 120 (!) 127  Resp: (!) 23  17 (!) 26 16  Temp:      TempSrc:      SpO2: 93% 98% 95% (!) 79%      Constitutional: Morbidly obese, in mild respiratory distress Vitals:   09/12/19 1730 09/12/19 1800 09/12/19 1830 09/12/19 1900  BP: (!) 114/54 118/71 117/71 (!) 104/47  Pulse: (!) 134 (!) 127 (!) 120 (!) 127  Resp: (!) 23 17 (!) 26 16  Temp:      TempSrc:      SpO2: 93% 98% 95% (!) 79%   Eyes: PERRL, lids and conjunctivae normal ENMT: Mucous membranes are moist. Posterior pharynx clear of any exudate or lesions.Normal dentition.  Neck: normal, supple, no masses, no thyromegaly Respiratory: Decreased air entry with marked expiratory wheezing, increased respiratory effort.  No accessory muscle use.  Cardiovascular: Tachycardic, no murmurs / rubs / gallops. No extremity edema. 2+ pedal pulses. No carotid bruits.  Abdomen: no tenderness, no masses palpated. No hepatosplenomegaly. Bowel sounds positive.  Musculoskeletal: no clubbing / cyanosis. No joint deformity upper and lower extremities. Good ROM, no contractures. Normal muscle tone.  Skin: no rashes, lesions, ulcers. No induration Neurologic: CN 2-12 grossly intact. Sensation intact, DTR normal. Strength 5/5 in all 4.  Psychiatric: Normal  judgment and insight. Alert and oriented x 3. Normal mood.     Labs on Admission: I have personally reviewed following labs and imaging studies  CBC: Recent Labs  Lab 09/12/19 1352  WBC 6.6  NEUTROABS 5.3  HGB 14.2  HCT 44.5  MCV 86.4  PLT 123XX123   Basic Metabolic Panel: Recent Labs  Lab 09/12/19 1352  NA 141  K 3.9  CL 105  CO2 26  GLUCOSE 112*  BUN 10  CREATININE 0.67  CALCIUM 8.9   GFR: CrCl cannot be calculated (Unknown ideal weight.). Liver Function Tests: No results for input(s): AST, ALT, ALKPHOS, BILITOT, PROT, ALBUMIN in the last 168 hours. No results for input(s): LIPASE, AMYLASE in the last 168 hours. No results for input(s): AMMONIA in the last 168 hours. Coagulation Profile: No results for input(s): INR, PROTIME in the last 168 hours. Cardiac Enzymes: No results for input(s): CKTOTAL, CKMB, CKMBINDEX, TROPONINI in the last 168 hours. BNP (last 3 results) No results for input(s): PROBNP in the last 8760 hours. HbA1C: No results for input(s): HGBA1C in the last 72 hours. CBG: No results for input(s): GLUCAP in the last 168 hours. Lipid Profile: No results for input(s): CHOL, HDL, LDLCALC, TRIG, CHOLHDL, LDLDIRECT in the last 72 hours. Thyroid Function Tests: No results for input(s): TSH, T4TOTAL, FREET4, T3FREE, THYROIDAB in the last 72 hours. Anemia Panel: No results for input(s): VITAMINB12, FOLATE, FERRITIN, TIBC, IRON, RETICCTPCT in the last 72 hours. Urine analysis: No results found for: COLORURINE, APPEARANCEUR, LABSPEC, PHURINE, GLUCOSEU, HGBUR, BILIRUBINUR, KETONESUR, PROTEINUR, UROBILINOGEN, NITRITE, LEUKOCYTESUR Sepsis Labs: @LABRCNTIP (procalcitonin:4,lacticidven:4) ) Recent Results (from the past 240 hour(s))  Respiratory Panel by RT PCR (Flu A&B, Covid) - Nasopharyngeal Swab     Status: None   Collection Time: 09/12/19  2:15 PM   Specimen: Nasopharyngeal Swab  Result Value Ref Range Status   SARS Coronavirus 2 by RT PCR NEGATIVE  NEGATIVE Final    Comment: (NOTE) SARS-CoV-2 target nucleic acids are NOT DETECTED. The SARS-CoV-2 RNA is generally detectable in upper respiratoy specimens during the acute phase of infection. The lowest concentration of SARS-CoV-2 viral copies this assay can detect is 131 copies/mL. A negative result does not preclude SARS-Cov-2 infection and should not be used as the sole  basis for treatment or other patient management decisions. A negative result may occur with  improper specimen collection/handling, submission of specimen other than nasopharyngeal swab, presence of viral mutation(s) within the areas targeted by this assay, and inadequate number of viral copies (<131 copies/mL). A negative result must be combined with clinical observations, patient history, and epidemiological information. The expected result is Negative. Fact Sheet for Patients:  PinkCheek.be Fact Sheet for Healthcare Providers:  GravelBags.it This test is not yet ap proved or cleared by the [place] FDA and  has been authorized for detection and/or diagnosis of SARS-CoV-2 by FDA under an Emergency Use Authorization (EUA). This EUA will remain  in effect (meaning this test can be used) for the duration of the COVID-19 declaration under Section 564(b)(1) of the Act, 21 U.S.C. section 360bbb-3(b)(1), unless the authorization is terminated or revoked sooner.    Influenza A by PCR NEGATIVE NEGATIVE Final   Influenza B by PCR NEGATIVE NEGATIVE Final    Comment: (NOTE) The Xpert Xpress SARS-CoV-2/FLU/RSV assay is intended as an aid in  the diagnosis of influenza from Nasopharyngeal swab specimens and  should not be used as a sole basis for treatment. Nasal washings and  aspirates are unacceptable for Xpert Xpress SARS-CoV-2/FLU/RSV  testing. Fact Sheet for Patients: PinkCheek.be Fact Sheet for Healthcare  Providers: GravelBags.it This test is not yet approved or cleared by the [place] FDA and  has been authorized for detection and/or diagnosis of SARS-CoV-2 by  FDA under an Emergency Use Authorization (EUA). This EUA will remain  in effect (meaning this test can be used) for the duration of the  Covid-19 declaration under Section 564(b)(1) of the Act, 21  U.S.C. section 360bbb-3(b)(1), unless the authorization is  terminated or revoked. Performed at [facility], [place] [address] [place], [place] [address]      Radiological Exams on Admission: DG Chest Port 1 View  Result Date: 09/12/2019 CLINICAL DATA:  Cough, chills, body aches since Friday EXAM: PORTABLE CHEST 1 VIEW COMPARISON:  12/19/2015 FINDINGS: Single frontal view of the chest demonstrates a stable cardiac silhouette. Mild atherosclerosis of the aortic arch. No airspace disease, effusion, or pneumothorax. No acute bony abnormalities. IMPRESSION: 1. No acute intrathoracic process. Electronically Signed   By: Randa Ngo M.D.   On: 09/12/2019 15:02    EKG: Independently reviewed.  Shows sinus tachycardia otherwise no significant findings.  Assessment/Plan Principal Problem:   COPD exacerbation (HCC) Active Problems:   COPD III B (based on July/2019 - spiro)   GERD (gastroesophageal reflux disease)   Hyperlipidemia   Obstructive sleep apnea     #1 acute exacerbation of COPD: Patient will be admitted.  She has allergies to prednisone but has tolerated Solu-Medrol before.  We will initiate her on IV Solu-Medrol nebulizer treatment and antibiotics.  She is allergic to most antibiotics.  With her tachypnea and fever leukocytosis she may have early pneumonia noted on x-ray.  I will to cefepime in this case which she can tolerate.  #2 GERD: Continue with PPIs.  #3 hyperlipidemia: Continue with statin.  #4 obstructive sleep apnea: We will offer CPAP.  #5 morbid obesity:  Counseling provided.   DVT prophylaxis: Lovenox Code Status: Full code Family Communication: Discussed care with patient Disposition Plan: Home Consults called: None Admission status: Observation  Severity of Illness: The appropriate patient status for this patient is OBSERVATION. Observation status is judged to be reasonable and necessary in order to provide the required intensity of service to  ensure the patient's safety. The patient's presenting symptoms, physical exam findings, and initial radiographic and laboratory data in the context of their medical condition is felt to place them at decreased risk for further clinical deterioration. Furthermore, it is anticipated that the patient will be medically stable for discharge from the hospital within 2 midnights of admission. The following factors support the patient status of observation.   " The patient's presenting symptoms include shortness of breath and cough. " The physical exam findings include marked Tory wheezing. " The initial radiographic and laboratory data are no infiltrates on chest x-ray.     Barbette Merino MD [facility] Pager [phone]  If 7PM-7AM, please contact night-coverage [email or web address] Password [facility]  09/12/2019, 7:12 PM

## 2019-09-12 NOTE — Progress Notes (Signed)
Pharmacy Antibiotic Note  [name] Molina is a 74 y.o. female admitted on 09/12/2019 with 3 day hx of non-productive cough/aches/nasal congestion.  Pharmacy has been consulted for Aztreonam dosing, contacted MD, ok to use Cefepime as patient tolerates Cephalosporins.  Plan: Cefepime 2gm q8     Temp (24hrs), Avg:99.7 F (37.6 C), Min:98.5 F (36.9 C), Max:100.8 F (38.2 C)  Recent Labs  Lab 09/12/19 1352  WBC 6.6  CREATININE 0.67    CrCl cannot be calculated (Unknown ideal weight.).    Allergies  Allergen Reactions  . Food Shortness Of Breath    ALLERGY= MUSHROOMS  . Tequin Shortness Of Breath  . Codeine Nausea And Vomiting  . Erythromycin Other (See Comments)    GI UPSET  . Levaquin [Levofloxacin In D5w] Other (See Comments)    "made me want to die."  . Wellbutrin [Bupropion Hcl] Other (See Comments)    SHAKING  . Penicillins Rash  . Prednisone Rash    Antimicrobials this admission: 4/25 Cefepime >>   Dose adjustments this admission:  Microbiology results: 4/25 Resp PCR: Covid neg, Flu neg/neg  Thank you for allowing pharmacy to be a part of this patient's care.  Minda Ditto PharmD 09/12/2019 7:24 PM

## 2019-09-12 NOTE — ED Notes (Signed)
[name] (daughter) [phone]

## 2019-09-12 NOTE — ED Triage Notes (Signed)
Pt BIB EMS from home. Pt reports cough, chills, and body aches since Friday. Pt hx of COPD and Afib. Pt reports having [place] vaccine on 4/10. AxO x4. Ambulates with assistance.  94% RA HR 80 140/70

## 2019-09-12 NOTE — ED Provider Notes (Signed)
[place] [facility] DEPT Provider Note   CSN: [id number] Arrival date & time: 09/12/19  1239     History Chief Complaint  Patient presents with  . Generalized Body Aches  . Cough    [name] Molina is a 74 y.o. female.  HPI She presents for evaluation of general achiness, nonproductive cough, nasal drainage, all for 3 days.  She had a [employer] Covid vaccine, 2 weeks ago.  Her cough is nonproductive.  She has a history of COPD.  She has been able to taste food.  She vomited once, while coughing but does not have ongoing vomiting or diarrhea.  She denies chest pain, abdominal pain or back pain.  She is taking her usual medications.  There are no other known modifying factors.    Past Medical History:  Diagnosis Date  . Anxiety   . Asthma   . Atrial tachycardia ([place])   . Cataract   . COPD (chronic obstructive pulmonary disease) ([place])   . Diverticulosis   . GERD (gastroesophageal reflux disease)   . Hyperlipidemia   . Hypertension   . Insomnia   . Macular degeneration   . Migraines   . Neuropathy     Patient Active Problem List   Diagnosis Date Noted  . Edema of both lower extremities 05/07/2018  . Pulmonary nodule 12/01/2017  . Dyspnea 11/26/2017  . RUQ abdominal pain 11/27/2015  . Suprapubic pain 11/27/2015  . Dysphagia 11/27/2015  . Abdominal mass, RUQ (right upper quadrant) 11/27/2015  . Obstructive sleep apnea 04/06/2015  . PAT (paroxysmal atrial tachycardia) ([place]) 08/09/2014  . GERD (gastroesophageal reflux disease)   . Hyperlipidemia   . Spinal stenosis of lumbar region 03/16/2014  . Idiopathic peripheral neuropathy 03/16/2014  . Hypertension 06/13/2011  . Allergic rhinitis 06/13/2011  . Chronic headaches 06/13/2011  . Emphysema 06/13/2011  . COPD III B (based on July/2019 - spiro) 04/25/2007    Past Surgical History:  Procedure Laterality Date  . CERVICAL POLYPECTOMY     INSIDE AND OUTSIDE  . COLONOSCOPY    .  POLYPECTOMY    . REMOVED CARTLIAGE RIB       OB History   No obstetric history on file.     Family History  Problem Relation Age of Onset  . Colon polyps Sister 19       PRECANCEROUS POLYPS  . Breast cancer Maternal Grandmother 106  . Emphysema Father   . Asthma Father   . Heart disease Father   . Heart disease Paternal Grandfather   . Colon cancer Neg Hx     Social History   Tobacco Use  . Smoking status: Former Smoker    Packs/day: 1.00    Years: 20.00    Pack years: 20.00    Types: Cigarettes    Quit date: 05/21/1991    Years since quitting: 28.3  . Smokeless tobacco: Never Used  Substance Use Topics  . Alcohol use: No  . Drug use: No    Home Medications Prior to Admission medications   Medication Sig Start Date End Date Taking? Authorizing Provider  acetaminophen (TYLENOL) 500 MG tablet Take 1,000 mg by mouth every 6 (six) hours as needed for moderate pain or headache.   Yes [provider]  albuterol (PROVENTIL HFA;VENTOLIN HFA) 108 (90 Base) MCG/ACT inhaler Inhale 2 puffs into the lungs every 6 (six) hours as needed for wheezing or shortness of breath. 03/05/18  Yes Collene Gobble, MD  aspirin 81 MG  tablet Take 81 mg by mouth daily.     Yes [provider]  aspirin-acetaminophen-caffeine (EXCEDRIN MIGRAINE) [phone] MG per tablet Take 2 tablets by mouth every 6 (six) hours as needed for headache.   Yes [provider]  BESIVANCE 0.6 % SUSP Place 1 drop into the left eye See admin instructions. Instill 1 drop qid day of eye injection and qid the day after. 09/07/19  Yes [provider]  CARTIA XT 240 MG 24 hr capsule TAKE ONE CAPSULE BY MOUTH DAILY Patient taking differently: Take 240 mg by mouth daily.  03/19/16  Yes Jerline Pain, MD  clobetasol cream (TEMOVATE) [id number] % Apply 1 application topically daily as needed (yeast.).  11/01/13  Yes [provider]  FLUoxetine (PROZAC) 40 MG capsule Take 40 mg by mouth every  morning. 11/19/16  Yes [provider]  furosemide (LASIX) 20 MG tablet TAKE 1 TABLET (20 MG TOTAL) BY MOUTH DAILY AS NEEDED. PLEASE MAKE OVERDUE APPT WITH DR. Marlou Porch BEFORE ANYMORE REFILLS. 1ST ATTEMPT Patient taking differently: Take 20 mg by mouth daily as needed for fluid. Please make overdue appt with Dr. Marlou Porch before anymore refills. 1st attempt 02/23/19  Yes Jerline Pain, MD  ibuprofen (ADVIL,MOTRIN) 200 MG tablet Take 400 mg by mouth every 6 (six) hours as needed for headache or moderate pain.   Yes [provider]  lactase (LACTAID) 3000 units tablet Take 3,000 Units by mouth 3 (three) times daily as needed (lactose ingestion).    Yes [provider]  loratadine-pseudoephedrine (CLARITIN-D 12-HOUR) 5-120 MG per tablet Take 1 tablet by mouth daily.   Yes [provider]  LORazepam (ATIVAN) 0.5 MG tablet Take 0.5-1 mg by mouth daily as needed for anxiety. Anxiety   Yes [provider]  montelukast (SINGULAIR) 10 MG tablet Take 10 mg by mouth daily as needed.  02/21/14  Yes [provider]  Multiple Vitamins-Minerals (OCUVITE PO) Take 1 tablet by mouth 2 (two) times daily.    Yes [provider]  omeprazole (PRILOSEC) 20 MG capsule Take 20 mg by mouth at bedtime.    Yes [provider]  pravastatin (PRAVACHOL) 40 MG tablet Take 40 mg by mouth at bedtime.  09/04/10  Yes [provider]  tizanidine (ZANAFLEX) 2 MG capsule Take 2 mg by mouth at bedtime as needed for muscle spasms (head pain per pt).    Yes [provider]  valsartan-hydrochlorothiazide (DIOVAN-HCT) 160-12.5 MG tablet Take 0.5 tablets by mouth daily.    Yes [provider]  benzonatate (TESSALON) 200 MG capsule Take 1 capsule (200 mg total) by mouth 3 (three) times daily as needed for cough. Patient not taking: Reported on 09/12/2019 07/22/18   Martyn Ehrich, NP  fluticasone [facility]) 50 MCG/ACT nasal spray Place 1 spray into both  nostrils daily. Patient not taking: Reported on 09/12/2019 07/19/16   Collene Gobble, MD  Fluticasone-Umeclidin-Vilant (TRELEGY ELLIPTA) 100-62.5-25 MCG/INH AEPB Take 1 puff by mouth daily. 09/17/18   Lauraine Rinne, NP  HYDROcodone-acetaminophen (NORCO/VICODIN) 5-325 MG tablet Take 1-2 tablets by mouth every 6 (six) hours as needed for moderate pain or severe pain. Patient not taking: Reported on 09/12/2019 05/09/19   Charlesetta Shanks, MD    Allergies    Food, Tequin, Codeine, Erythromycin, Levaquin [levofloxacin in d5w], Wellbutrin [bupropion hcl], Penicillins, and Prednisone  Review of Systems   Review of Systems  All other systems reviewed and are negative.   Physical Exam Updated Vital Signs BP Marland Kitchen)  114/54   Pulse (!) 134   Temp 98.5 F (36.9 C) (Oral)   Resp (!) 23   SpO2 93%   Physical Exam Vitals and nursing note reviewed.  Constitutional:      General: She is not in acute distress.    Appearance: She is well-developed. She is obese. She is ill-appearing. She is not toxic-appearing.  HENT:     Head: Normocephalic and atraumatic.     Right Ear: External ear normal.     Left Ear: External ear normal.  Eyes:     Conjunctiva/sclera: Conjunctivae normal.     Pupils: Pupils are equal, round, and reactive to light.  Neck:     Trachea: Phonation normal.  Cardiovascular:     Rate and Rhythm: Tachycardia present.  Pulmonary:     Effort: Pulmonary effort is normal. No respiratory distress.     Breath sounds: No stridor.  Abdominal:     General: There is no distension.     Palpations: Abdomen is soft.     Tenderness: There is no abdominal tenderness.  Musculoskeletal:        General: Swelling present. Normal range of motion.     Cervical back: Normal range of motion and neck supple.     Right lower leg: Edema present.     Left lower leg: Edema present.  Skin:    General: Skin is warm and dry.     Coloration: Skin is not jaundiced.  Neurological:     Mental Status: She is  alert and oriented to person, place, and time.     Cranial Nerves: No cranial nerve deficit.     Sensory: No sensory deficit.     Motor: No abnormal muscle tone.     Coordination: Coordination normal.  Psychiatric:        Mood and Affect: Mood normal.        Behavior: Behavior normal.        Thought Content: Thought content normal.        Judgment: Judgment normal.     ED Results / Procedures / Treatments   Labs (all labs ordered are listed, but only abnormal results are displayed) Labs Reviewed  BASIC METABOLIC PANEL - Abnormal; Notable for the following components:      Result Value   Glucose, Bld 112 (*)    All other components within normal limits  CBC WITH DIFFERENTIAL/PLATELET - Abnormal; Notable for the following components:   RBC 5.15 (*)    Lymphs Abs 0.5 (*)    All other components within normal limits  RESPIRATORY PANEL BY RT PCR (FLU A&B, COVID)  POC SARS CORONAVIRUS 2 AG -  ED    EKG None  Radiology DG Chest Port 1 View  Result Date: 09/12/2019 CLINICAL DATA:  Cough, chills, body aches since Friday EXAM: PORTABLE CHEST 1 VIEW COMPARISON:  12/19/2015 FINDINGS: Single frontal view of the chest demonstrates a stable cardiac silhouette. Mild atherosclerosis of the aortic arch. No airspace disease, effusion, or pneumothorax. No acute bony abnormalities. IMPRESSION: 1. No acute intrathoracic process. Electronically Signed   By: Randa Ngo M.D.   On: 09/12/2019 15:02    Procedures .Critical Care Performed by: Daleen Bo, MD Authorized by: Daleen Bo, MD   Critical care provider statement:    Critical care time (minutes):  45   Critical care start time:  09/12/2019 1:10 PM   Critical care end time:  09/12/2019 5:47 PM   Critical care time was exclusive of:  Separately billable procedures and treating other patients   Critical care was necessary to treat or prevent imminent or life-threatening deterioration of the following conditions:  Respiratory  failure   Critical care was time spent personally by me on the following activities:  Blood draw for specimens, development of treatment plan with patient or surrogate, discussions with consultants, evaluation of patient's response to treatment, examination of patient, obtaining history from patient or surrogate, ordering and performing treatments and interventions, ordering and review of laboratory studies, pulse oximetry, re-evaluation of patient's condition, review of old charts and ordering and review of radiographic studies   (including critical care time)  Medications Ordered in ED Medications  sodium chloride 0.9 % bolus 500 mL (has no administration in time range)  acetaminophen (TYLENOL) tablet 650 mg (has no administration in time range)  methylPREDNISolone sodium succinate (SOLU-MEDROL) 125 mg/2 mL injection 125 mg (125 mg Intravenous Given 09/12/19 1631)  magnesium sulfate IVPB 2 g 50 mL (0 g Intravenous Stopped 09/12/19 1734)  albuterol (PROVENTIL,VENTOLIN) solution continuous neb (10 mg/hr Nebulization Given 09/12/19 1623)    ED Course  I have reviewed the triage vital signs and the nursing notes.  Pertinent labs & imaging results that were available during my care of the patient were reviewed by me and considered in my medical decision making (see chart for details).  Clinical Course as of Sep 11 1745  Sun Sep 12, 2019  1415 Covid antigen test is negative, PCR test ordered.   [EW]  1513 No infiltrate or CHF, interpreted by me  DG Chest [facility] [EW]  [id number] Normal  Respiratory Panel by RT PCR (Flu A&B, Covid) - Nasopharyngeal Swab [EW]  1554 Normal  CBC with Differential(!) [EW]  1554 Normal except glucose high  Basic metabolic panel(!) [EW]  [id number] Patient states she feels some better however is still complaining of generalized achiness.  PCR Covid is negative so will order nebulizer and begin treatment for COPD exacerbation.   [EW]  1559 Negative  Respiratory Panel by  RT PCR (Flu A&B, Covid) - Nasopharyngeal Swab [EW]  [id number] Patient is feeling nervous after the albuterol nebulizer, and heart rate is 138.  She is currently on a trial off oxygen and the O2 sat is 88% on room air.  She continues to complain of a mild headache.  Fever improved with Tylenol earlier.  Will give second dose of Tylenol.  Patient understands that she will likely require hospitalization.  IV fluids ordered for tachycardia.   [EW]    Clinical Course User Index [EW] Daleen Bo, MD   MDM Rules/Calculators/A&P                       Patient Vitals for the past 24 hrs:  BP Temp Temp src Pulse Resp SpO2  09/12/19 1730 (!) 114/54 -- -- (!) 134 (!) 23 93 %  09/12/19 1639 -- 98.5 F (36.9 C) Oral -- -- --  09/12/19 1637 120/75 -- -- (!) 112 16 100 %  09/12/19 1623 -- -- -- -- -- 94 %  09/12/19 1530 (!) 163/83 -- -- 98 (!) 22 96 %  09/12/19 1500 (!) 155/96 -- -- (!) 121 17 93 %  09/12/19 1443 (!) 163/99 -- -- (!) 114 (!) 21 93 %  09/12/19 1400 (!) 156/84 -- -- (!) 118 (!) 22 95 %  09/12/19 1330 (!) 158/87 -- -- (!) 124 10 96 %  09/12/19 1309 -- -- -- -- -- 95 %  09/12/19 1303 (!) 148/94 (!) 100.8 F (38.2 C) Oral (!) 120 16 (!) 86 %    5:47 PM Reevaluation with update and discussion. After initial assessment and treatment, an updated evaluation reveals she remains uncomfortable, and tachycardic.  Findings discussed with her.  I also discussed the findings with the patient's daughter by telephone.Daleen Bo   Medical Decision Making:  This patient is presenting for evaluation of myalgias with cough and fever, which does require a range of treatment options, and is a complaint that involves a high risk of morbidity and mortality. The differential diagnoses include viral infection, Covid infection, COPD exacerbation. I decided  to review old records, and in summary she has fairly uncomplicated COPD, and was recently vaccinated for COVID-19.  She is at moderately high risk for  complications of a viral illness. I did not require additional historical information from anyone. Clinical Laboratory Tests Ordered, included COVID-19 antigen testing, CBC, metabolic panel. Radiologic Tests Ordered, included chest x-ray. I independently Visualized: Radiographic images, which show normal lung fields, no infiltrate or CHF; Cardiac Monitor Tracing which shows sinus tachycardia.  [name] Molina was evaluated in Emergency Department on 09/12/2019 for the symptoms described in the history of present illness. She was evaluated in the context of the global COVID-19 pandemic, which necessitated consideration that the patient might be at risk for infection with the SARS-CoV-2 virus that causes COVID-19. Institutional protocols and algorithms that pertain to the evaluation of patients at risk for COVID-19 are in a state of rapid change based on information released by regulatory bodies including the CDC and federal and state organizations. These policies and algorithms were followed during the patient's care in the ED.   Critical Interventions-clinical evaluation, laboratory testing, Covid testing, observation and reevaluation.  Oxygen support to maintain greater than 90% oxygen saturation.  Medications for COPD exacerbation.  After These Interventions, the Patient was reevaluated and was found with persistent hypoxia and tachycardia treatment with nebulizer.  Covid testing negative.  Clinical evaluation consistent with COPD exacerbation, somewhat unstable, requiring hospitalization.  No evidence for advanced airway, or other urgent interventions.  CRITICAL CARE-yes Performed by: Daleen Bo   Nursing Notes Reviewed/ Care Coordinated Applicable Imaging Reviewed Interpretation of Laboratory Data incorporated into ED treatment  5:46 PM-Consult complete with hospitalist. Patient case explained and discussed.  He agrees to admit patient for further evaluation and treatment. Call ended at 7:13  PM  Plan: Admit  Final Clinical Impression(s) / ED Diagnoses Final diagnoses:  COPD exacerbation ([place])  Hypoxia  Tachycardia    Rx / DC Orders ED Discharge Orders    None       Daleen Bo, MD 09/12/19 1916

## 2019-09-12 NOTE — ED Notes (Signed)
ED TO INPATIENT HANDOFF REPORT  Name/Age/Gender [name] Molina 74 y.o. female  Code Status Code Status History    Date Active Date Inactive Code Status Order ID Comments User Context   05/22/2014 1959 05/26/2014 1556 Full Code [id number]  Rama, Venetia Maxon, MD Inpatient   Advance Care Planning Activity      Home/SNF/Other Home  Chief Complaint COPD exacerbation [facility]) [J44.1]  Level of Care/Admitting Diagnosis ED Disposition    ED Disposition Condition Comment   [place]: [facility] [[id number]]  Level of Care: Telemetry [5]  Admit to tele based on following criteria: Complex arrhythmia (Bradycardia/Tachycardia)  Covid Evaluation: Confirmed COVID Negative  Diagnosis: COPD exacerbation ([place]) [007121]  Admitting Physician: Elwyn Reach [[id number]]  Attending Physician: Elwyn Reach [[id number]]       Medical History Past Medical History:  Diagnosis Date  . Anxiety   . Asthma   . Atrial tachycardia ([place])   . Cataract   . COPD (chronic obstructive pulmonary disease) ([place])   . Diverticulosis   . GERD (gastroesophageal reflux disease)   . Hyperlipidemia   . Hypertension   . Insomnia   . Macular degeneration   . Migraines   . Neuropathy     Allergies Allergies  Allergen Reactions  . Food Shortness Of Breath    ALLERGY= MUSHROOMS  . Tequin Shortness Of Breath  . Codeine Nausea And Vomiting  . Erythromycin Other (See Comments)    GI UPSET  . Levaquin [Levofloxacin In D5w] Other (See Comments)    "made me want to die."  . Wellbutrin [Bupropion Hcl] Other (See Comments)    SHAKING  . Penicillins Rash  . Prednisone Rash    IV Location/Drains/Wounds Patient Lines/Drains/Airways Status   Active Line/Drains/Airways    Name:   Placement date:   Placement time:   Site:   Days:   Peripheral IV 09/12/19 Left;Posterior Forearm   09/12/19    1425    Forearm   less than 1   Wound 01/22/12 Laceration Heel Left Lac is 1-1.5 inches, hardly any  depth, maybe 0.25 inches.   01/22/12    2304    Heel   2790          Labs/Imaging Results for orders placed or performed during the hospital encounter of 09/12/19 (from the past 48 hour(s))  Basic metabolic panel     Status: Abnormal   Collection Time: 09/12/19  1:52 PM  Result Value Ref Range   Sodium 141 135 - 145 mmol/L   Potassium 3.9 3.5 - 5.1 mmol/L   Chloride 105 98 - 111 mmol/L   CO2 26 22 - 32 mmol/L   Glucose, Bld 112 (H) 70 - 99 mg/dL    Comment: Glucose reference range applies only to samples taken after fasting for at least 8 hours.   BUN 10 8 - 23 mg/dL   Creatinine, Ser 0.67 0.44 - 1.00 mg/dL   Calcium 8.9 8.9 - 10.3 mg/dL   GFR calc non Af Amer >60 >60 mL/min   GFR calc Af Amer >60 >60 mL/min   Anion gap 10 5 - 15    Comment: Performed at [facility], [place] [address] [place], [place] [address]  CBC with Differential     Status: Abnormal   Collection Time: 09/12/19  1:52 PM  Result Value Ref Range   WBC 6.6 4.0 - 10.5 K/uL   RBC 5.15 (H) 3.87 - 5.11 MIL/uL   Hemoglobin  14.2 12.0 - 15.0 g/dL   HCT 44.5 36.0 - 46.0 %   MCV 86.4 80.0 - 100.0 fL   MCH 27.6 26.0 - 34.0 pg   MCHC 31.9 30.0 - 36.0 g/dL   RDW 15.1 11.5 - 15.5 %   Platelets 165 150 - 400 K/uL   nRBC 0.0 0.0 - 0.2 %   Neutrophils Relative % 79 %   Neutro Abs 5.3 1.7 - 7.7 K/uL   Lymphocytes Relative 7 %   Lymphs Abs 0.5 (L) 0.7 - 4.0 K/uL   Monocytes Relative 12 %   Monocytes Absolute 0.8 0.1 - 1.0 K/uL   Eosinophils Relative 0 %   Eosinophils Absolute 0.0 0.0 - 0.5 K/uL   Basophils Relative 1 %   Basophils Absolute 0.1 0.0 - 0.1 K/uL   Immature Granulocytes 1 %   Abs Immature Granulocytes 0.04 0.00 - 0.07 K/uL    Comment: Performed at [facility], [place] [address] [place], [place] [address]  POC SARS Coronavirus 2 Ag-ED - Nasal Swab (BD Veritor Kit)     Status: None   Collection Time: 09/12/19  1:59 PM  Result Value Ref Range   SARS Coronavirus 2 Ag  NEGATIVE NEGATIVE    Comment: (NOTE) SARS-CoV-2 antigen NOT DETECTED.  Negative results are presumptive.  Negative results do not preclude SARS-CoV-2 infection and should not be used as the sole basis for treatment or other patient management decisions, including infection  control decisions, particularly in the presence of clinical signs and  symptoms consistent with COVID-19, or in those who have been in contact with the virus.  Negative results must be combined with clinical observations, patient history, and epidemiological information. The expected result is Negative. Fact Sheet for Patients: PodPark.tn Fact Sheet for Healthcare Providers: GiftContent.is This test is not yet approved or cleared by the [place] FDA and  has been authorized for detection and/or diagnosis of SARS-CoV-2 by FDA under an Emergency Use Authorization (EUA).  This EUA will remain in effect (meaning this test can be used) for the duration of  the COVID-19 de claration under Section 564(b)(1) of the Act, 21 U.S.C. section 360bbb-3(b)(1), unless the authorization is terminated or revoked sooner.   Respiratory Panel by RT PCR (Flu A&B, Covid) - Nasopharyngeal Swab     Status: None   Collection Time: 09/12/19  2:15 PM   Specimen: Nasopharyngeal Swab  Result Value Ref Range   SARS Coronavirus 2 by RT PCR NEGATIVE NEGATIVE    Comment: (NOTE) SARS-CoV-2 target nucleic acids are NOT DETECTED. The SARS-CoV-2 RNA is generally detectable in upper respiratoy specimens during the acute phase of infection. The lowest concentration of SARS-CoV-2 viral copies this assay can detect is 131 copies/mL. A negative result does not preclude SARS-Cov-2 infection and should not be used as the sole basis for treatment or other patient management decisions. A negative result may occur with  improper specimen collection/handling, submission of specimen other than  nasopharyngeal swab, presence of viral mutation(s) within the areas targeted by this assay, and inadequate number of viral copies (<131 copies/mL). A negative result must be combined with clinical observations, patient history, and epidemiological information. The expected result is Negative. Fact Sheet for Patients:  PinkCheek.be Fact Sheet for Healthcare Providers:  GravelBags.it This test is not yet ap proved or cleared by the [place] FDA and  has been authorized for detection and/or diagnosis of SARS-CoV-2 by FDA under an Emergency Use Authorization (EUA). This EUA will remain  in effect (meaning this test can be used) for the duration of the COVID-19 declaration under Section 564(b)(1) of the Act, 21 U.S.C. section 360bbb-3(b)(1), unless the authorization is terminated or revoked sooner.    Influenza A by PCR NEGATIVE NEGATIVE   Influenza B by PCR NEGATIVE NEGATIVE    Comment: (NOTE) The Xpert Xpress SARS-CoV-2/FLU/RSV assay is intended as an aid in  the diagnosis of influenza from Nasopharyngeal swab specimens and  should not be used as a sole basis for treatment. Nasal washings and  aspirates are unacceptable for Xpert Xpress SARS-CoV-2/FLU/RSV  testing. Fact Sheet for Patients: PinkCheek.be Fact Sheet for Healthcare Providers: GravelBags.it This test is not yet approved or cleared by the [place] FDA and  has been authorized for detection and/or diagnosis of SARS-CoV-2 by  FDA under an Emergency Use Authorization (EUA). This EUA will remain  in effect (meaning this test can be used) for the duration of the  Covid-19 declaration under Section 564(b)(1) of the Act, 21  U.S.C. section 360bbb-3(b)(1), unless the authorization is  terminated or revoked. Performed at [facility], [place] [address], [place], [place] [address]    DG  Chest Port 1 View  Result Date: 09/12/2019 CLINICAL DATA:  Cough, chills, body aches since Friday EXAM: PORTABLE CHEST 1 VIEW COMPARISON:  12/19/2015 FINDINGS: Single frontal view of the chest demonstrates a stable cardiac silhouette. Mild atherosclerosis of the aortic arch. No airspace disease, effusion, or pneumothorax. No acute bony abnormalities. IMPRESSION: 1. No acute intrathoracic process. Electronically Signed   By: Randa Ngo M.D.   On: 09/12/2019 15:02    Pending Labs [employer] (From admission, onward)    Start     Ordered   Signed and Held  CBC  (enoxaparin (LOVENOX)    CrCl >/= 30 ml/min)  Once,   R    Comments: Baseline for enoxaparin therapy IF NOT ALREADY DRAWN.  Notify MD if PLT < 100 K.    Signed and Held   Signed and Held  Creatinine, serum  (enoxaparin (LOVENOX)    CrCl >/= 30 ml/min)  Once,   R    Comments: Baseline for enoxaparin therapy IF NOT ALREADY DRAWN.    Signed and Held   Signed and Held  Creatinine, serum  (enoxaparin (LOVENOX)    CrCl >/= 30 ml/min)  Weekly,   R    Comments: while on enoxaparin therapy    Signed and Held   Signed and Held  Comprehensive metabolic panel  Tomorrow morning,   R     Signed and Held   Signed and Held  CBC  Tomorrow morning,   R     Signed and Held          Vitals/Pain Today's Vitals   09/12/19 2017 09/12/19 2030 09/12/19 2100 09/12/19 2130  BP:  122/63 115/89 (!) 128/97  Pulse:  (!) 115 (!) 109 (!) 107  Resp:  19 18 20   Temp:      TempSrc:      SpO2:  91% 94% 93%  PainSc: 0-No pain       Isolation Precautions Airborne and Contact precautions  Medications Medications  ceFEPIme (MAXIPIME) 2 g in sodium chloride 0.9 % 100 mL IVPB (0 g Intravenous Stopped 09/12/19 2149)  methylPREDNISolone sodium succinate (SOLU-MEDROL) 125 mg/2 mL injection 125 mg (125 mg Intravenous Given 09/12/19 1631)  magnesium sulfate IVPB 2 g 50 mL (0 g Intravenous Stopped 09/12/19 1734)  albuterol (PROVENTIL,VENTOLIN) solution  continuous neb (  10 mg/hr Nebulization Given 09/12/19 1623)  sodium chloride 0.9 % bolus 500 mL (0 mLs Intravenous Stopped 09/12/19 2018)  acetaminophen (TYLENOL) tablet 650 mg (650 mg Oral Given 09/12/19 1757)    Mobility walks with device

## 2019-09-13 DIAGNOSIS — J441 Chronic obstructive pulmonary disease with (acute) exacerbation: Secondary | ICD-10-CM | POA: Diagnosis not present

## 2019-09-13 DIAGNOSIS — I110 Hypertensive heart disease with heart failure: Secondary | ICD-10-CM | POA: Diagnosis present

## 2019-09-13 DIAGNOSIS — K2100 Gastro-esophageal reflux disease with esophagitis, without bleeding: Secondary | ICD-10-CM | POA: Diagnosis not present

## 2019-09-13 DIAGNOSIS — E785 Hyperlipidemia, unspecified: Secondary | ICD-10-CM | POA: Diagnosis not present

## 2019-09-13 DIAGNOSIS — Z87891 Personal history of nicotine dependence: Secondary | ICD-10-CM | POA: Diagnosis not present

## 2019-09-13 DIAGNOSIS — J9601 Acute respiratory failure with hypoxia: Secondary | ICD-10-CM | POA: Diagnosis not present

## 2019-09-13 DIAGNOSIS — J4520 Mild intermittent asthma, uncomplicated: Secondary | ICD-10-CM | POA: Diagnosis not present

## 2019-09-13 DIAGNOSIS — F329 Major depressive disorder, single episode, unspecified: Secondary | ICD-10-CM | POA: Diagnosis not present

## 2019-09-13 DIAGNOSIS — J9621 Acute and chronic respiratory failure with hypoxia: Secondary | ICD-10-CM | POA: Diagnosis present

## 2019-09-13 DIAGNOSIS — K219 Gastro-esophageal reflux disease without esophagitis: Secondary | ICD-10-CM | POA: Diagnosis present

## 2019-09-13 DIAGNOSIS — I480 Paroxysmal atrial fibrillation: Secondary | ICD-10-CM | POA: Diagnosis not present

## 2019-09-13 DIAGNOSIS — H3530 Unspecified macular degeneration: Secondary | ICD-10-CM | POA: Diagnosis present

## 2019-09-13 DIAGNOSIS — G4733 Obstructive sleep apnea (adult) (pediatric): Secondary | ICD-10-CM | POA: Diagnosis present

## 2019-09-13 DIAGNOSIS — Z7982 Long term (current) use of aspirin: Secondary | ICD-10-CM | POA: Diagnosis not present

## 2019-09-13 DIAGNOSIS — J9600 Acute respiratory failure, unspecified whether with hypoxia or hypercapnia: Secondary | ICD-10-CM | POA: Diagnosis present

## 2019-09-13 DIAGNOSIS — J449 Chronic obstructive pulmonary disease, unspecified: Secondary | ICD-10-CM | POA: Diagnosis not present

## 2019-09-13 DIAGNOSIS — M19042 Primary osteoarthritis, left hand: Secondary | ICD-10-CM | POA: Diagnosis not present

## 2019-09-13 DIAGNOSIS — R0602 Shortness of breath: Secondary | ICD-10-CM | POA: Diagnosis not present

## 2019-09-13 DIAGNOSIS — M19041 Primary osteoarthritis, right hand: Secondary | ICD-10-CM | POA: Diagnosis not present

## 2019-09-13 DIAGNOSIS — Z8249 Family history of ischemic heart disease and other diseases of the circulatory system: Secondary | ICD-10-CM | POA: Diagnosis not present

## 2019-09-13 DIAGNOSIS — I471 Supraventricular tachycardia: Secondary | ICD-10-CM | POA: Diagnosis not present

## 2019-09-13 DIAGNOSIS — I5032 Chronic diastolic (congestive) heart failure: Secondary | ICD-10-CM | POA: Diagnosis not present

## 2019-09-13 DIAGNOSIS — E7800 Pure hypercholesterolemia, unspecified: Secondary | ICD-10-CM | POA: Diagnosis not present

## 2019-09-13 DIAGNOSIS — Z6841 Body Mass Index (BMI) 40.0 and over, adult: Secondary | ICD-10-CM | POA: Diagnosis not present

## 2019-09-13 DIAGNOSIS — Z881 Allergy status to other antibiotic agents status: Secondary | ICD-10-CM | POA: Diagnosis not present

## 2019-09-13 DIAGNOSIS — G43009 Migraine without aura, not intractable, without status migrainosus: Secondary | ICD-10-CM | POA: Diagnosis not present

## 2019-09-13 DIAGNOSIS — Z20822 Contact with and (suspected) exposure to covid-19: Secondary | ICD-10-CM | POA: Diagnosis not present

## 2019-09-13 DIAGNOSIS — D72829 Elevated white blood cell count, unspecified: Secondary | ICD-10-CM | POA: Diagnosis present

## 2019-09-13 DIAGNOSIS — D509 Iron deficiency anemia, unspecified: Secondary | ICD-10-CM | POA: Diagnosis not present

## 2019-09-13 DIAGNOSIS — Z79899 Other long term (current) drug therapy: Secondary | ICD-10-CM | POA: Diagnosis not present

## 2019-09-13 DIAGNOSIS — I4891 Unspecified atrial fibrillation: Secondary | ICD-10-CM | POA: Diagnosis not present

## 2019-09-13 DIAGNOSIS — Z7951 Long term (current) use of inhaled steroids: Secondary | ICD-10-CM | POA: Diagnosis not present

## 2019-09-13 DIAGNOSIS — I10 Essential (primary) hypertension: Secondary | ICD-10-CM | POA: Diagnosis not present

## 2019-09-13 LAB — CBC
HCT: 40.3 % (ref 36.0–46.0)
Hemoglobin: 12.9 g/dL (ref 12.0–15.0)
MCH: 27.5 pg (ref 26.0–34.0)
MCHC: 32 g/dL (ref 30.0–36.0)
MCV: 85.9 fL (ref 80.0–100.0)
Platelets: 159 10*3/uL (ref 150–400)
RBC: 4.69 MIL/uL (ref 3.87–5.11)
RDW: 15.3 % (ref 11.5–15.5)
WBC: 5.4 10*3/uL (ref 4.0–10.5)
nRBC: 0 % (ref 0.0–0.2)

## 2019-09-13 LAB — BRAIN NATRIURETIC PEPTIDE: B Natriuretic Peptide: 335.9 pg/mL — ABNORMAL HIGH (ref 0.0–100.0)

## 2019-09-13 MED ORDER — IPRATROPIUM-ALBUTEROL 0.5-2.5 (3) MG/3ML IN SOLN
3.0000 mL | Freq: Three times a day (TID) | RESPIRATORY_TRACT | Status: DC
  Administered 2019-09-13 – 2019-09-14 (×4): 3 mL via RESPIRATORY_TRACT
  Filled 2019-09-13 (×5): qty 3

## 2019-09-13 MED ORDER — IPRATROPIUM-ALBUTEROL 0.5-2.5 (3) MG/3ML IN SOLN: 3.0000 mL | Freq: Four times a day (QID) | RESPIRATORY_TRACT | Status: DC

## 2019-09-13 MED ORDER — ALBUTEROL SULFATE (2.5 MG/3ML) 0.083% IN NEBU: 2.5000 mg | INHALATION_SOLUTION | RESPIRATORY_TRACT | Status: DC | PRN

## 2019-09-13 MED ORDER — FUROSEMIDE 20 MG PO TABS
20.0000 mg | ORAL_TABLET | Freq: Every day | ORAL | Status: DC
  Administered 2019-09-13 – 2019-09-14 (×2): 20 mg via ORAL
  Filled 2019-09-13 (×2): qty 1

## 2019-09-13 NOTE — Progress Notes (Signed)
[facility]                                                                              Patient Demographics  [name] Molina, is a 74 y.o. female, DOB - 02-Nov-1945, ID:[id number]  Admit date - 09/12/2019   Admitting Physician Elwyn Reach, MD  Outpatient Primary MD for the patient is Harlan Stains, MD  Outpatient specialists:   LOS - 0  days   Medical records reviewed and are as summarized below:    Chief Complaint  Patient presents with  . Generalized Body Aches  . Cough       Brief summary   74 year old female with history of COPD, asthma, GERD, hyperlipidemia, hypertension, macular degeneration presented to ED with progressive shortness of breath, coughing and wheezing.  Patient was treated with nebulized treatments but no relief after 3 treatments, was admitted for further work-up. O2 sats 86% on room air, was placed on O2, 98% on 2 L.  Temp 100.8 F  Assessment & Plan    Principal Problem:   Acute respiratory failure with hypoxia (HCC) contrary to acute COPD exacerbation -Continue IV Solu-Medrol, scheduled nebs, patient started on IV antibiotics for possible early pneumonia -Narrow antibiotics in next 24 hours if improved -Continue Anoro, flutter valve, I-S -Currently stable wheezing, hypoxia on 3 L O2, home O2 evaluation prior to discharge.  At baseline not on O2   Active Problems:  Mild acute on chronic diastolic CHF -Mild fluid overload, had received IV fluids overnight, discontinued IV fluids -BNP slightly elevated, 335.9, patient takes Lasix as needed at home, placed on daily Lasix 20 mg daily during hospitalization -2D echo 02/2018 had shown EF of 60 to 65% with grade 2 diastolic dysfunction    Hypertension, hx of PAT - cont cardizem, avapro, lasix       GERD (gastroesophageal reflux disease) -Continue PPI    Hyperlipidemia -Continue Pravachol  Obesity Estimated body mass index is 42.98 kg/m as calculated from the following:   Height as of this encounter: 5\' 2"  (1.575 m).   Weight as of this encounter: 106.6 kg.  Code Status: Full code DVT Prophylaxis: Lovenox Family Communication: Discussed all imaging results, lab results, explained to the patient   Disposition Plan:     Status is: Observation  The patient remains OBS appropriate and will d/c before 2 midnights.  Dispo: The patient is from: Home              Anticipated d/c is to: Home              Anticipated d/c date is: 1 day              Patient currently is not medically stable to d/c.  Still wheezing, hypoxia on 3 L O2      Time Spent in minutes   35   Procedures:  None   Consultants:   None  Antimicrobials:   Anti-infectives (From admission, onward)   Start     Dose/Rate Route Frequency Ordered Stop   09/12/19 2000  ceFEPIme (MAXIPIME) 2 g in sodium chloride 0.9 % 100 mL IVPB  2 g 200 mL/hr over 30 Minutes Intravenous Every 8 hours 09/12/19 1924           Medications  Scheduled Meds: . aspirin EC  81 mg Oral Daily  . diltiazem  240 mg Oral Daily  . enoxaparin (LOVENOX) injection  40 mg Subcutaneous QHS  . feeding supplement (ENSURE ENLIVE)  237 mL Oral BID BM  . FLUoxetine  40 mg Oral q morning - 10a  . fluticasone  1 puff Inhalation BID  . hydrochlorothiazide  12.5 mg Oral Daily  . irbesartan  75 mg Oral Daily  . loratadine  5 mg Oral Daily  . methylPREDNISolone (SOLU-MEDROL) injection  60 mg Intravenous BID  . montelukast  10 mg Oral QHS  . pantoprazole  40 mg Oral Daily  . pravastatin  40 mg Oral QHS  . umeclidinium-vilanterol  1 puff Inhalation Daily   Continuous Infusions: . ceFEPime (MAXIPIME) IV Stopped (09/13/19 0355)   PRN Meds:.acetaminophen, albuterol, aspirin-acetaminophen-caffeine, clobetasol cream, furosemide, ibuprofen, lactase, LORazepam, ondansetron **OR** ondansetron (ZOFRAN) IV, tiZANidine      Subjective:   [name] Molina was seen and examined today.  Starting to feel better, states was  short of breath and winded at the time of admission.  Still not close to baseline, wheezing. Patient denies dizziness, chest pain, abdominal pain, N/V/D/C, new weakness, numbess, tingling. No acute events overnight.    Objective:   Vitals:   09/13/19 0201 09/13/19 0545 09/13/19 1038 09/13/19 1047  BP: 123/62 (!) 124/58  (!) 112/58  Pulse: 89 85  80  Resp: 18 20    Temp: 98 F (36.7 C) 97.9 F (36.6 C)    TempSrc: Oral Oral    SpO2: 97% 95% 95% 98%  Weight:      Height:        Intake/Output Summary (Last 24 hours) at 09/13/2019 1107 Last data filed at 09/13/2019 0600 Gross per 24 hour  Intake 1036.41 ml  Output -  Net 1036.41 ml     Wt Readings from Last 3 Encounters:  09/12/19 106.6 kg  05/09/19 102.1 kg  01/15/19 105.2 kg     Exam  General: Alert and oriented x 3, NAD  Cardiovascular: S1 S2 auscultated, no murmurs, RRR  Respiratory: Bilateral mild expiratory wheezing  Gastrointestinal: Soft, nontender, nondistended, + bowel sounds  Ext: Trace pedal edema bilaterally  Neuro: No new deficits  Musculoskeletal: No digital cyanosis, clubbing  Skin: No rashes  Psych: Normal affect and demeanor, alert and oriented x3    Data Reviewed:  I have personally reviewed following labs and imaging studies  Micro Results Recent Results (from the past 240 hour(s))  Respiratory Panel by RT PCR (Flu A&B, Covid) - Nasopharyngeal Swab     Status: None   Collection Time: 09/12/19  2:15 PM   Specimen: Nasopharyngeal Swab  Result Value Ref Range Status   SARS Coronavirus 2 by RT PCR NEGATIVE NEGATIVE Final    Comment: (NOTE) SARS-CoV-2 target nucleic acids are NOT DETECTED. The SARS-CoV-2 RNA is generally detectable in upper respiratoy specimens during the acute phase of infection. The lowest concentration of SARS-CoV-2 viral copies this assay can detect is 131 copies/mL. A negative result does not preclude SARS-Cov-2 infection and should not be used as the sole basis for  treatment or other patient management decisions. A negative result may occur with  improper specimen collection/handling, submission of specimen other than nasopharyngeal swab, presence of viral mutation(s) within the areas targeted by this assay, and inadequate number of  viral copies (<131 copies/mL). A negative result must be combined with clinical observations, patient history, and epidemiological information. The expected result is Negative. Fact Sheet for Patients:  PinkCheek.be Fact Sheet for Healthcare Providers:  GravelBags.it This test is not yet ap proved or cleared by the [place] FDA and  has been authorized for detection and/or diagnosis of SARS-CoV-2 by FDA under an Emergency Use Authorization (EUA). This EUA will remain  in effect (meaning this test can be used) for the duration of the COVID-19 declaration under Section 564(b)(1) of the Act, 21 U.S.C. section 360bbb-3(b)(1), unless the authorization is terminated or revoked sooner.    Influenza A by PCR NEGATIVE NEGATIVE Final   Influenza B by PCR NEGATIVE NEGATIVE Final    Comment: (NOTE) The Xpert Xpress SARS-CoV-2/FLU/RSV assay is intended as an aid in  the diagnosis of influenza from Nasopharyngeal swab specimens and  should not be used as a sole basis for treatment. Nasal washings and  aspirates are unacceptable for Xpert Xpress SARS-CoV-2/FLU/RSV  testing. Fact Sheet for Patients: PinkCheek.be Fact Sheet for Healthcare Providers: GravelBags.it This test is not yet approved or cleared by the [place] FDA and  has been authorized for detection and/or diagnosis of SARS-CoV-2 by  FDA under an Emergency Use Authorization (EUA). This EUA will remain  in effect (meaning this test can be used) for the duration of the  Covid-19 declaration under Section 564(b)(1) of the Act, 21  U.S.C. section  360bbb-3(b)(1), unless the authorization is  terminated or revoked. Performed at [facility], [place] [address] [place],  [address]     Radiology Reports DG Chest Port 1 View  Result Date: 09/12/2019 CLINICAL DATA:  Cough, chills, body aches since Friday EXAM: PORTABLE CHEST 1 VIEW COMPARISON:  12/19/2015 FINDINGS: Single frontal view of the chest demonstrates a stable cardiac silhouette. Mild atherosclerosis of the aortic arch. No airspace disease, effusion, or pneumothorax. No acute bony abnormalities. IMPRESSION: 1. No acute intrathoracic process. Electronically Signed   By: Randa Ngo M.D.   On: 09/12/2019 15:02    Lab Data:  CBC: Recent Labs  Lab 09/12/19 1352 09/13/19 0427  WBC 6.6 5.4  NEUTROABS 5.3  --   HGB 14.2 12.9  HCT 44.5 40.3  MCV 86.4 85.9  PLT 165 [id number]   Basic Metabolic Panel: Recent Labs  Lab 09/12/19 1352  NA 141  K 3.9  CL 105  CO2 26  GLUCOSE 112*  BUN 10  CREATININE 0.67  CALCIUM 8.9   GFR: Estimated Creatinine Clearance: 69.7 mL/min (by C-G formula based on SCr of 0.67 mg/dL). Liver Function Tests: No results for input(s): AST, ALT, ALKPHOS, BILITOT, PROT, ALBUMIN in the last 168 hours. No results for input(s): LIPASE, AMYLASE in the last 168 hours. No results for input(s): AMMONIA in the last 168 hours. Coagulation Profile: No results for input(s): INR, PROTIME in the last 168 hours. Cardiac Enzymes: No results for input(s): CKTOTAL, CKMB, CKMBINDEX, TROPONINI in the last 168 hours. BNP (last 3 results) No results for input(s): PROBNP in the last 8760 hours. HbA1C: No results for input(s): HGBA1C in the last 72 hours. CBG: No results for input(s): GLUCAP in the last 168 hours. Lipid Profile: No results for input(s): CHOL, HDL, LDLCALC, TRIG, CHOLHDL, LDLDIRECT in the last 72 hours. Thyroid Function Tests: No results for input(s): TSH, T4TOTAL, FREET4, T3FREE, THYROIDAB in the last 72 hours. Anemia Panel:  No results for input(s): VITAMINB12, FOLATE, FERRITIN, TIBC, IRON, RETICCTPCT in the last  72 hours. Urine analysis: No results found for: COLORURINE, APPEARANCEUR, LABSPEC, PHURINE, GLUCOSEU, HGBUR, BILIRUBINUR, KETONESUR, PROTEINUR, UROBILINOGEN, NITRITE, LEUKOCYTESUR   Daleigh Pollinger M.D. [facility] 09/13/2019, 11:07 AM   Call night coverage person covering after 7pm

## 2019-09-13 NOTE — Evaluation (Signed)
Physical Therapy Evaluation Patient Details Name: [name] Molina MRN: [id number] DOB: 08-Nov-1945 Today's Date: 09/13/2019   History of Present Illness  74 yo female admitted with acute respiratory failure, COPD exac. Hx of COPD, asthma, macular degeneration, neuropathy  Clinical Impression  On eval, pt required Min Assist for mobility. She walked ~70 feet with a RW. O2 94% on 1L [place] with ambulation, dyspnea 3/4. Pt is very unsteady and at high risk for falls when ambulating. Will continue to follow and progress activity as tolerated.     Follow Up Recommendations Home health PT;Supervision/Assistance - 24 hour    Equipment Recommendations  None recommended by PT    Recommendations for Other Services OT consult     Precautions / Restrictions Precautions Precautions: Fall Precaution Comments: monitor O2, HR Restrictions Weight Bearing Restrictions: No      Mobility  Bed Mobility Overal bed mobility: Needs Assistance Bed Mobility: Supine to Sit     Supine to sit: Supervision;HOB elevated     General bed mobility comments: for safety, lines. increased time.  Transfers Overall transfer level: Needs assistance Equipment used: None;Rolling walker (2 wheeled) Transfers: Sit to/from [employer] Sit to Stand: Min assist   Squat pivot transfers: Min assist     General transfer comment: Assist to power up and stabilize. Increased time. Cues for safety, hand placement. Squat pivot, bed<>bsc, with pt using armests or bed surface for UE support.  Ambulation/Gait Ambulation/Gait assistance: Min assist Gait Distance (Feet): 70 Feet Assistive device: Rolling walker (2 wheeled) Gait Pattern/deviations: Decreased step length - right;Decreased step length - left;Decreased stride length;Step-through pattern     General Gait Details: Assist to stabilize pt throughout distance. Very unsteady and at high risk for falls. Had to break up gait sequencing "walker, step,  step..." for increased stability and safety.  Stairs            Wheelchair Mobility    Modified Rankin (Stroke Patients Only)       Balance Overall balance assessment: Needs assistance;History of Falls         Standing balance support: Bilateral upper extremity supported Standing balance-Leahy Scale: Poor                               Pertinent Vitals/Pain Pain Assessment: No/denies pain    Home Living Family/patient expects to be discharged to:: Private residence Living Arrangements: Children Available Help at Discharge: Family Type of Home: House Home Access: Stairs to enter Entrance Stairs-Rails: None Entrance Stairs-Number of Steps: 1 Home Layout: Able to live on main level with bedroom/bathroom Home Equipment: Cane - single point;Walker - 4 wheels;Shower seat      Prior Function Level of Independence: Independent with assistive device(s)               Hand Dominance        Extremity/Trunk Assessment   Upper Extremity Assessment Upper Extremity Assessment: Defer to OT evaluation    Lower Extremity Assessment Lower Extremity Assessment: Generalized weakness    Cervical / Trunk Assessment Cervical / Trunk Assessment: Normal  Communication   Communication: No difficulties  Cognition Arousal/Alertness: Awake/alert Behavior During Therapy: WFL for tasks assessed/performed Overall Cognitive Status: Within Functional Limits for tasks assessed  General Comments      Exercises     Assessment/Plan    PT Assessment Patient needs continued PT services  PT Problem List Decreased strength;Decreased mobility;Decreased balance;Decreased activity tolerance;Decreased knowledge of use of DME       PT Treatment Interventions DME instruction;Gait training;Therapeutic activities;Therapeutic exercise;Patient/family education;Balance training;Functional mobility training    PT Goals  (Current goals can be found in the Care Plan section)  Acute Rehab PT Goals Patient Stated Goal: home soon PT Goal Formulation: With patient Time For Goal Achievement: 09/27/19 Potential to Achieve Goals: Good    Frequency Min 3X/week   Barriers to discharge        Co-evaluation               AM-PAC PT "6 Clicks" Mobility  Outcome Measure Help needed turning from your back to your side while in a flat bed without using bedrails?: A Little Help needed moving from lying on your back to sitting on the side of a flat bed without using bedrails?: A Little Help needed moving to and from a bed to a chair (including a wheelchair)?: A Little Help needed standing up from a chair using your arms (e.g., wheelchair or bedside chair)?: A Little Help needed to walk in hospital room?: A Little Help needed climbing 3-5 steps with a railing? : A Lot 6 Click Score: 17    End of Session Equipment Utilized During Treatment: Gait belt;Oxygen Activity Tolerance: Patient limited by fatigue Patient left: in bed;with call bell/phone within reach;with bed alarm set   PT Visit Diagnosis: Difficulty in walking, not elsewhere classified (R26.2);History of falling (Z91.81);Unsteadiness on feet (R26.81);Muscle weakness (generalized) (M62.81)    Time: [id number] PT Time Calculation (min) (ACUTE ONLY): 26 min   Charges:   PT Evaluation $PT Eval Low Complexity: 1 Low PT Treatments $Gait Training: 8-22 mins          Doreatha Massed, PT Acute Rehabilitation

## 2019-09-13 NOTE — Plan of Care (Signed)
  Problem: Education: Goal: Knowledge of General Education information will improve Description: Including pain rating scale, medication(s)/side effects and non-pharmacologic comfort measures 09/13/2019 1615 by Lavona Mound, RN Outcome: Progressing 09/13/2019 1615 by Lavona Mound, RN Outcome: Progressing   Problem: Health Behavior/Discharge Planning: Goal: Ability to manage health-related needs will improve 09/13/2019 1615 by Lavona Mound, RN Outcome: Progressing 09/13/2019 1615 by Lavona Mound, RN Outcome: Progressing   Problem: Clinical Measurements: Goal: Ability to maintain clinical measurements within normal limits will improve 09/13/2019 1615 by Lavona Mound, RN Outcome: Progressing 09/13/2019 1615 by Lavona Mound, RN Outcome: Progressing Goal: Will remain free from infection Outcome: Progressing Goal: Cardiovascular complication will be avoided Outcome: Progressing   Problem: Activity: Goal: Risk for activity intolerance will decrease Outcome: Progressing   Problem: Nutrition: Goal: Adequate nutrition will be maintained Outcome: Progressing   Problem: Coping: Goal: Level of anxiety will decrease Outcome: Progressing   Problem: Elimination: Goal: Will not experience complications related to bowel motility Outcome: Progressing Goal: Will not experience complications related to urinary retention Outcome: Progressing   Problem: Pain Managment: Goal: General experience of comfort will improve Outcome: Progressing   Problem: Safety: Goal: Ability to remain free from injury will improve Outcome: Progressing   Problem: Skin Integrity: Goal: Risk for impaired skin integrity will decrease Outcome: Progressing

## 2019-09-14 LAB — BASIC METABOLIC PANEL
Anion gap: 9 (ref 5–15)
BUN: 24 mg/dL — ABNORMAL HIGH (ref 8–23)
CO2: 24 mmol/L (ref 22–32)
Calcium: 9 mg/dL (ref 8.9–10.3)
Chloride: 106 mmol/L (ref 98–111)
Creatinine, Ser: 0.78 mg/dL (ref 0.44–1.00)
GFR calc Af Amer: >60 mL/min (ref >60)
GFR calc non Af Amer: >60 mL/min (ref >60)
Glucose, Bld: 178 mg/dL — ABNORMAL HIGH (ref 70–99)
Potassium: 4.2 mmol/L (ref 3.5–5.1)
Sodium: 139 mmol/L (ref 135–145)

## 2019-09-14 LAB — CBC
HCT: 39.7 % (ref 36.0–46.0)
Hemoglobin: 12.5 g/dL (ref 12.0–15.0)
MCH: 27.4 pg (ref 26.0–34.0)
MCHC: 31.5 g/dL (ref 30.0–36.0)
MCV: 87.1 fL (ref 80.0–100.0)
Platelets: 179 10*3/uL (ref 150–400)
RBC: 4.56 MIL/uL (ref 3.87–5.11)
RDW: 15.3 % (ref 11.5–15.5)
WBC: 9.9 10*3/uL (ref 4.0–10.5)
nRBC: 0 % (ref 0.0–0.2)

## 2019-09-14 MED ORDER — PREDNISONE 20 MG PO TABS
40.0000 mg | ORAL_TABLET | Freq: Every day | ORAL | Status: DC
  Administered 2019-09-15 – 2019-09-18 (×4): 40 mg via ORAL
  Filled 2019-09-14 (×4): qty 2

## 2019-09-14 MED ORDER — CEFDINIR 300 MG PO CAPS
300.0000 mg | ORAL_CAPSULE | Freq: Two times a day (BID) | ORAL | Status: DC
  Administered 2019-09-14 – 2019-09-18 (×8): 300 mg via ORAL
  Filled 2019-09-14 (×8): qty 1

## 2019-09-14 MED ORDER — LEVALBUTEROL HCL 0.63 MG/3ML IN NEBU: 0.6300 mg | INHALATION_SOLUTION | Freq: Three times a day (TID) | RESPIRATORY_TRACT | Status: DC | PRN

## 2019-09-14 MED ORDER — DILTIAZEM HCL 30 MG PO TABS
30.0000 mg | ORAL_TABLET | Freq: Once | ORAL | Status: AC
  Administered 2019-09-14: 30 mg via ORAL
  Filled 2019-09-14: qty 1

## 2019-09-14 MED ORDER — METOPROLOL TARTRATE 5 MG/5ML IV SOLN
5.0000 mg | INTRAVENOUS | Status: AC
  Administered 2019-09-14: 5 mg via INTRAVENOUS
  Filled 2019-09-14: qty 5

## 2019-09-14 MED ORDER — DOXYCYCLINE HYCLATE 100 MG PO TABS
100.0000 mg | ORAL_TABLET | Freq: Two times a day (BID) | ORAL | Status: DC
  Administered 2019-09-14 – 2019-09-18 (×8): 100 mg via ORAL
  Filled 2019-09-14 (×8): qty 1

## 2019-09-14 MED ORDER — FUROSEMIDE 10 MG/ML IJ SOLN
40.0000 mg | Freq: Two times a day (BID) | INTRAMUSCULAR | Status: AC
  Administered 2019-09-14 – 2019-09-16 (×4): 40 mg via INTRAVENOUS
  Filled 2019-09-14 (×4): qty 4

## 2019-09-14 MED ORDER — PHENOL 1.4 % MT LIQD
1.0000 | OROMUCOSAL | Status: DC | PRN
  Administered 2019-09-14: 1 via OROMUCOSAL
  Filled 2019-09-14: qty 177

## 2019-09-14 MED ORDER — DILTIAZEM HCL ER COATED BEADS 180 MG PO CP24
300.0000 mg | ORAL_CAPSULE | Freq: Every day | ORAL | Status: DC
  Administered 2019-09-15: 300 mg via ORAL
  Filled 2019-09-14 (×3): qty 1

## 2019-09-14 NOTE — Progress Notes (Signed)
OT Cancellation Note  Patient Details Name: [name] Molina MRN: [id number] DOB: 05-06-1946   Cancelled Treatment:    Reason Eval/Treat Not Completed: Patient not medically ready(RN requests OT to hold today. Reports patient's HR into 140s with activity today and unable to tolerate much movement. Reports patient just returned to bed and tired. Will f/u tomorrow.)  Lenward Chancellor 09/14/2019, 3:29 PM

## 2019-09-14 NOTE — Progress Notes (Signed)
Pt has converted to NSR heart in the 70s

## 2019-09-14 NOTE — Progress Notes (Signed)
Pt's heart rate now 95-low 100s after IV Lopressor given. BP 125/58. Pt denies any needs. Maintain current plan of care for Pt

## 2019-09-14 NOTE — Progress Notes (Signed)
Telemetry Atrial Fib with heart 120-140s. Vital signs BP 125/60 room air O2 saturation 96%. Pt alert and oriented and denies c/o chest pain shortness of breath only complaint is of mild headache which Pt ws medicated earlier for. MD paged to update

## 2019-09-14 NOTE — Progress Notes (Signed)
Physical Therapy Treatment Patient Details Name: [name] Molina MRN: [id number] DOB: 1946/04/16 Today's Date: 09/14/2019    History of Present Illness 74 yo female admitted with acute respiratory failure, COPD exac. Hx of COPD, asthma, macular degeneration, neuropathy    PT Comments    Pt has been in Afib this am per pt and RN. Assisted pt to bsc then to recliner for lunch. Deferred hallway ambulation on today 2* increased HR. HR up to 140s with activity in room. O2 93% on RA but dyspnea 3/4. Will continue to follow and progress activity as able.     Follow Up Recommendations  Home health PT;Supervision/Assistance - 24 hour     Equipment Recommendations  None recommended by PT    Recommendations for Other Services OT consult     Precautions / Restrictions Precautions Precautions: Fall Precaution Comments: monitor O2, HR Restrictions Weight Bearing Restrictions: No    Mobility  Bed Mobility Overal bed mobility: Needs Assistance Bed Mobility: Supine to Sit     Supine to sit: Supervision;HOB elevated     General bed mobility comments: for safety, lines. increased time. Pt relied on bedrail.  Transfers Overall transfer level: Needs assistance Equipment used: None;Rolling walker (2 wheeled) Transfers: Sit to/from [employer] Sit to Stand: Min assist   Squat pivot transfers: Min assist     General transfer comment: Assist to power up and stabilize. Increased time. Cues for safety, hand placement. Squat pivot, bed<>bsc, with pt using armests or bed surface for UE support.  Ambulation/Gait Ambulation/Gait assistance: Min assist Gait Distance (Feet): 20 Feet Assistive device: 4-wheeled walker Gait Pattern/deviations: Decreased step length - right;Decreased step length - left;Decreased stride length;Step-through pattern     General Gait Details: Limited ambulation distance on today (remained in room) 2* increased HR-up to 140s with activity. O2 93% on  RA. Dyspena 3/4.   Stairs             Wheelchair Mobility    Modified Rankin (Stroke Patients Only)       Balance Overall balance assessment: Needs assistance;History of Falls         Standing balance support: Bilateral upper extremity supported Standing balance-Leahy Scale: Poor Standing balance comment: at risk for falls when ambulating                            Cognition Arousal/Alertness: Awake/alert Behavior During Therapy: WFL for tasks assessed/performed Overall Cognitive Status: Within Functional Limits for tasks assessed                                        Exercises      General Comments        Pertinent Vitals/Pain Pain Assessment: No/denies pain    Home Living                      Prior Function            PT Goals (current goals can now be found in the care plan section) Progress towards PT goals: Progressing toward goals(but limited by HR on today)    Frequency    Min 3X/week      PT Plan Current plan remains appropriate    Co-evaluation              AM-PAC PT "6 Clicks" Mobility  Outcome Measure  Help needed turning from your back to your side while in a flat bed without using bedrails?: A Little Help needed moving from lying on your back to sitting on the side of a flat bed without using bedrails?: A Little Help needed moving to and from a bed to a chair (including a wheelchair)?: A Little Help needed standing up from a chair using your arms (e.g., wheelchair or bedside chair)?: A Little Help needed to walk in hospital room?: A Little Help needed climbing 3-5 steps with a railing? : A Lot 6 Click Score: 17    End of Session Equipment Utilized During Treatment: Gait belt Activity Tolerance: Patient limited by fatigue(limited by dyspnea) Patient left: in chair;with call bell/phone within reach;with chair alarm set         Time: [id number] PT Time Calculation (min) (ACUTE  ONLY): 24 min  Charges:  $Gait Training: 8-22 mins $Therapeutic Activity: 8-22 mins                         Doreatha Massed, PT Acute Rehabilitation

## 2019-09-14 NOTE — Progress Notes (Signed)
MD updated via phone and new orders given. Per new orders IV Lopressor given 5 mg. Pt educated.

## 2019-09-14 NOTE — Progress Notes (Signed)
   09/14/19 0855  Assess: MEWS Score  Temp 98.2 F (36.8 C)  BP 125/60  Pulse Rate (!) 135  Resp 16  Level of Consciousness Alert  SpO2 98 %  O2 Device Room Air  Assess: MEWS Score  MEWS Temp 0  MEWS Systolic 0  MEWS Pulse 3  MEWS RR 0  MEWS LOC 0  MEWS Score 3  MEWS Score Color Yellow  Assess: if the MEWS score is Yellow or Red  Were vital signs taken at a resting state? Yes  Focused Assessment Documented focused assessment  Early Detection of Sepsis Score *See Row Information* Low  MEWS guidelines implemented *See Row Information* Yes  Treat  MEWS Interventions Other (Comment) (MD paged)  Take Vital Signs  Increase Vital Sign Frequency  Yellow: Q 2hr X 2 then Q 4hr X 2, if remains yellow, continue Q 4hrs  Escalate  MEWS: Escalate Yellow: discuss with charge nurse/RN and consider discussing with provider and RRT  Notify: Charge Nurse/RN  Name of Charge Nurse/RN Notified Gae Dry RN  Date Charge Nurse/RN Notified 09/14/19  Time Charge Nurse/RN Notified [id number]  Notify: Provider  Provider Name/Title Dr. [place]  Date Provider Notified 09/14/19  Time Provider Notified [phone]  Notification Type Page  Notification Reason Change in status;Other (Comment) (Increased heart rate)  Response See new orders  Date of Provider Response 09/14/19  Time of Provider Response [phone]

## 2019-09-14 NOTE — Progress Notes (Signed)
PROGRESS NOTE    [name] Molina  [id number] DOB: 22-Aug-1945 DOA: 09/12/2019 PCP: Harlan Stains, MD    Brief Narrative:  [name] Molina is a 74 year old Caucasian female with past medical history remarkable for COPD, asthma, GERD, HLD, hypertension, atrial fibrillation, macular degeneration who presents to the ED with progressive shortness of breath, coughing and wheezing.  Patient noted to have SPO2 86% on room air and placed on 2 L nasal cannula.  Patient was treated with neb treatments and admitted for further evaluation and work-up.   Assessment & Plan:   Principal Problem:   Acute respiratory failure with hypoxia (HCC) Active Problems:   Hypertension   GERD (gastroesophageal reflux disease)   Hyperlipidemia   Obstructive sleep apnea   COPD exacerbation (HCC)   Acute respiratory failure (HCC)   Acute respiratory failure with hypoxia Acute COPD exacerbation Patient presenting with progressive shortness of breath, notably hypoxic on presentation with SPO2 86%; not on home oxygen.  Chest x-ray unrevealing, afebrile without leukocytosis. --Continue to taper off steroids, start prednisone 40 mg p.o. daily tomorrow --Continue duo nebs/albuterol prn --Singulair 10 mg p.o. nightly --Anoro-Ellipta 62.5-25mcg 1 puff daily --Continue supplemental oxygen, maintain SPO2 greater than 88% --6-minute walk test  Chronic diastolic congestive heart failure BNP slightly elevated 335.9.  Was receiving IV fluids initially on admission.  On Lasix 20 mg p.o. daily at home.  2D echocardiogram 02/2018 with EF 60-65% with grade 2 diastolic dysfunction.  Chest x-ray on admission with no acute cardiopulmonary disease process. --Lasix 40 mg IV every 12 hours --Strict I's and O's and daily weights --Monitor renal function closely while receiving aggressive IV diuresis --Repeat chest x-ray in the a.m.  Essential hypertension Hx paroxysmal atrial tachycardia Patient follows with cardiology, Dr. Marlou Porch  outpatient.  Patient with several episodes of tachycardia last 24 hours. --Increase Cardizem CD to 300 mg p.o. daily --Continue valsartan/HCTZ 160-12.5 mg half tablet p.o. daily --Continue aspirin and statin --Continue to monitor on telemetry  GERD: Continue Protonix 40 mg p.o. daily  HLD: Continue pravastatin 40 mg p.o. nightly  Depression: Continue Prozac 40 mg p.o. daily  Obesity Body mass index is 42.98 kg/m.  Discussed with patient needs for aggressive weight loss measures/lifestyle changes as this complicates all facets of care.   DVT prophylaxis: Lovenox Code Status: Full code Family Communication: Discussed with the patient at bedside extensively.  Disposition Plan:  Status is: Inpatient  Remains inpatient appropriate because:Ongoing diagnostic testing needed not appropriate for outpatient work up, Unsafe d/c plan, IV treatments appropriate due to intensity of illness or inability to take PO and Inpatient level of care appropriate due to severity of illness   Dispo: The patient is from: Home              Anticipated d/c is to: Home vs SNF              Anticipated d/c date is: 2 days              Patient currently is not medically stable to d/c.   Consultants:   none  Procedures:   None  Antimicrobials:   Cefepime 4/25>>  Anti-infectives (From admission, onward)   Start     Dose/Rate Route Frequency Ordered Stop   09/12/19 2000  ceFEPIme (MAXIPIME) 2 g in sodium chloride 0.9 % 100 mL IVPB     2 g 200 mL/hr over 30 Minutes Intravenous Every 8 hours 09/12/19 1924        Subjective: Patient  seen and examined at bedside, resting comfortably.  Had several episodes of tachycardia this morning, especially when exerting herself.  Patient feels generalized "ill feeling".  Continues to require supplemental oxygen.  No other complaints or concerns at this time.  Denies headache, no fever/chills/night sweats, no chest pain, no palpitations, no abdominal pain.  No  other acute events overnight per nursing staff.  Objective: Vitals:   09/14/19 0937 09/14/19 1102 09/14/19 1103 09/14/19 1304  BP: (!) 125/58 122/66  137/69  Pulse: (!) 108 (!) 101 70 98  Resp: 16 16  18   Temp: 98.6 F (37 C) 98 F (36.7 C)  98.8 F (37.1 C)  TempSrc: Oral     SpO2: 100% 96% 96% 94%  Weight:      Height:        Intake/Output Summary (Last 24 hours) at 09/14/2019 1438 Last data filed at 09/14/2019 0600 Gross per 24 hour  Intake 420 ml  Output 625 ml  Net -205 ml   Filed Weights   09/12/19 2243  Weight: 106.6 kg    Examination:  General exam: Appears calm and comfortable  Respiratory system: Clear to auscultation. Respiratory effort normal. Cardiovascular system: S1 & S2 heard, RRR. No JVD, murmurs, rubs, gallops or clicks. No pedal edema. Gastrointestinal system: Abdomen is nondistended, soft and nontender. No organomegaly or masses felt. Normal bowel sounds heard. Central nervous system: Alert and oriented. No focal neurological deficits. Extremities: Symmetric 5 x 5 power. Skin: No rashes, lesions or ulcers Psychiatry: Judgement and insight appear normal. Mood & affect appropriate.     Data Reviewed: I have personally reviewed following labs and imaging studies  CBC: Recent Labs  Lab 09/12/19 1352 09/13/19 0427 09/14/19 0344  WBC 6.6 5.4 9.9  NEUTROABS 5.3  --   --   HGB 14.2 12.9 12.5  HCT 44.5 40.3 39.7  MCV 86.4 85.9 87.1  PLT 165 159 0000000   Basic Metabolic Panel: Recent Labs  Lab 09/12/19 1352 09/14/19 0344  NA 141 139  K 3.9 4.2  CL 105 106  CO2 26 24  GLUCOSE 112* 178*  BUN 10 24*  CREATININE 0.67 0.78  CALCIUM 8.9 9.0   GFR: Estimated Creatinine Clearance: 69.7 mL/min (by C-G formula based on SCr of 0.78 mg/dL). Liver Function Tests: No results for input(s): AST, ALT, ALKPHOS, BILITOT, PROT, ALBUMIN in the last 168 hours. No results for input(s): LIPASE, AMYLASE in the last 168 hours. No results for input(s): AMMONIA  in the last 168 hours. Coagulation Profile: No results for input(s): INR, PROTIME in the last 168 hours. Cardiac Enzymes: No results for input(s): CKTOTAL, CKMB, CKMBINDEX, TROPONINI in the last 168 hours. BNP (last 3 results) No results for input(s): PROBNP in the last 8760 hours. HbA1C: No results for input(s): HGBA1C in the last 72 hours. CBG: No results for input(s): GLUCAP in the last 168 hours. Lipid Profile: No results for input(s): CHOL, HDL, LDLCALC, TRIG, CHOLHDL, LDLDIRECT in the last 72 hours. Thyroid Function Tests: No results for input(s): TSH, T4TOTAL, FREET4, T3FREE, THYROIDAB in the last 72 hours. Anemia Panel: No results for input(s): VITAMINB12, FOLATE, FERRITIN, TIBC, IRON, RETICCTPCT in the last 72 hours. Sepsis Labs: No results for input(s): PROCALCITON, LATICACIDVEN in the last 168 hours.  Recent Results (from the past 240 hour(s))  Respiratory Panel by RT PCR (Flu A&B, Covid) - Nasopharyngeal Swab     Status: None   Collection Time: 09/12/19  2:15 PM   Specimen: Nasopharyngeal Swab  Result  Value Ref Range Status   SARS Coronavirus 2 by RT PCR NEGATIVE NEGATIVE Final    Comment: (NOTE) SARS-CoV-2 target nucleic acids are NOT DETECTED. The SARS-CoV-2 RNA is generally detectable in upper respiratoy specimens during the acute phase of infection. The lowest concentration of SARS-CoV-2 viral copies this assay can detect is 131 copies/mL. A negative result does not preclude SARS-Cov-2 infection and should not be used as the sole basis for treatment or other patient management decisions. A negative result may occur with  improper specimen collection/handling, submission of specimen other than nasopharyngeal swab, presence of viral mutation(s) within the areas targeted by this assay, and inadequate number of viral copies (<131 copies/mL). A negative result must be combined with clinical observations, patient history, and epidemiological information. The expected  result is Negative. Fact Sheet for Patients:  PinkCheek.be Fact Sheet for Healthcare Providers:  GravelBags.it This test is not yet ap proved or cleared by the [place] FDA and  has been authorized for detection and/or diagnosis of SARS-CoV-2 by FDA under an Emergency Use Authorization (EUA). This EUA will remain  in effect (meaning this test can be used) for the duration of the COVID-19 declaration under Section 564(b)(1) of the Act, 21 U.S.C. section 360bbb-3(b)(1), unless the authorization is terminated or revoked sooner.    Influenza A by PCR NEGATIVE NEGATIVE Final   Influenza B by PCR NEGATIVE NEGATIVE Final    Comment: (NOTE) The Xpert Xpress SARS-CoV-2/FLU/RSV assay is intended as an aid in  the diagnosis of influenza from Nasopharyngeal swab specimens and  should not be used as a sole basis for treatment. Nasal washings and  aspirates are unacceptable for Xpert Xpress SARS-CoV-2/FLU/RSV  testing. Fact Sheet for Patients: PinkCheek.be Fact Sheet for Healthcare Providers: GravelBags.it This test is not yet approved or cleared by the [place] FDA and  has been authorized for detection and/or diagnosis of SARS-CoV-2 by  FDA under an Emergency Use Authorization (EUA). This EUA will remain  in effect (meaning this test can be used) for the duration of the  Covid-19 declaration under Section 564(b)(1) of the Act, 21  U.S.C. section 360bbb-3(b)(1), unless the authorization is  terminated or revoked. Performed at [facility], [place] [address] [place], [place] [address]          Radiology Studies: No results found.      Scheduled Meds: . aspirin EC  81 mg Oral Daily  . [START ON 09/15/2019] diltiazem  300 mg Oral Daily  . enoxaparin (LOVENOX) injection  40 mg Subcutaneous QHS  . feeding supplement (ENSURE ENLIVE)  237 mL Oral  BID BM  . FLUoxetine  40 mg Oral q morning - 10a  . fluticasone  1 puff Inhalation BID  . furosemide  20 mg Oral Daily  . ipratropium-albuterol  3 mL Nebulization TID  . irbesartan  75 mg Oral Daily  . loratadine  5 mg Oral Daily  . methylPREDNISolone (SOLU-MEDROL) injection  60 mg Intravenous BID  . montelukast  10 mg Oral QHS  . pantoprazole  40 mg Oral Daily  . pravastatin  40 mg Oral QHS  . umeclidinium-vilanterol  1 puff Inhalation Daily   Continuous Infusions: . ceFEPime (MAXIPIME) IV 2 g (09/14/19 1129)     LOS: 1 day    Time spent: 39 minutes spent on chart review, discussion with nursing staff, consultants, updating family and interview/physical exam; more than 50% of that time was spent in counseling and/or coordination of care.  Aradhya Shellenbarger J [place] DO [facility] Available via Epic secure chat 7am-7pm After these hours, please refer to coverage provider listed on [email or web address] 09/14/2019, 2:38 PM

## 2019-09-15 ENCOUNTER — Inpatient Hospital Stay (HOSPITAL_COMMUNITY): Payer: Medicare Other

## 2019-09-15 LAB — BASIC METABOLIC PANEL
Anion gap: 10 (ref 5–15)
BUN: 31 mg/dL — ABNORMAL HIGH (ref 8–23)
CO2: 24 mmol/L (ref 22–32)
Calcium: 8.8 mg/dL — ABNORMAL LOW (ref 8.9–10.3)
Chloride: 105 mmol/L (ref 98–111)
Creatinine, Ser: 0.85 mg/dL (ref 0.44–1.00)
GFR calc Af Amer: >60 mL/min (ref >60)
GFR calc non Af Amer: >60 mL/min (ref >60)
Glucose, Bld: 218 mg/dL — ABNORMAL HIGH (ref 70–99)
Potassium: 4 mmol/L (ref 3.5–5.1)
Sodium: 139 mmol/L (ref 135–145)

## 2019-09-15 LAB — CBC
HCT: 38.4 % (ref 36.0–46.0)
Hemoglobin: 12 g/dL (ref 12.0–15.0)
MCH: 27.4 pg (ref 26.0–34.0)
MCHC: 31.3 g/dL (ref 30.0–36.0)
MCV: 87.7 fL (ref 80.0–100.0)
Platelets: 198 10*3/uL (ref 150–400)
RBC: 4.38 MIL/uL (ref 3.87–5.11)
RDW: 15.2 % (ref 11.5–15.5)
WBC: 7.8 10*3/uL (ref 4.0–10.5)
nRBC: 0 % (ref 0.0–0.2)

## 2019-09-15 MED ORDER — GUAIFENESIN ER 600 MG PO TB12
600.0000 mg | ORAL_TABLET | Freq: Two times a day (BID) | ORAL | Status: DC
  Administered 2019-09-15 – 2019-09-18 (×7): 600 mg via ORAL
  Filled 2019-09-15 (×7): qty 1

## 2019-09-15 MED ORDER — ENOXAPARIN SODIUM 60 MG/0.6ML ~~LOC~~ SOLN
0.5000 mg/kg | Freq: Every day | SUBCUTANEOUS | Status: DC
  Administered 2019-09-15 – 2019-09-16 (×2): 55 mg via SUBCUTANEOUS
  Filled 2019-09-15 (×2): qty 0.6

## 2019-09-15 MED ORDER — METOPROLOL TARTRATE 5 MG/5ML IV SOLN
5.0000 mg | INTRAVENOUS | Status: AC
  Administered 2019-09-15: 5 mg via INTRAVENOUS
  Filled 2019-09-15: qty 5

## 2019-09-15 NOTE — Evaluation (Signed)
Occupational Therapy Evaluation Patient Details Name: [name] Molina MRN: [id number] DOB: 12-28-1945 Today's Date: 09/15/2019    History of Present Illness 74 yo female admitted with acute respiratory failure, COPD exac. Hx of COPD, asthma, macular degeneration, neuropathy   Clinical Impression   [name] Molina is a 74 year old woman admitted to hospital with COPD exacerbation. She was held for therapy evaluation yesterday and this a.m. secondary to tachycardia. Currently patient HR 85 in sinus rhythm. On evaluation patient presents with good upper body strength, decreased activity tolerance and impaired balance limiting independent and safe ambulation needed for ADLs. Patient will benefit from skilled OT services while in hospital to improve independence with self care tasks in order to return home at discharge.     Follow Up Recommendations  No OT follow up    Equipment Recommendations       Recommendations for Other Services       Precautions / Restrictions Precautions Precautions: Fall Precaution Comments: monitor O2, HR Restrictions Weight Bearing Restrictions: No      Mobility Bed Mobility Overal bed mobility: Needs Assistance Bed Mobility: Supine to Sit     Supine to sit: Supervision;HOB elevated     General bed mobility comments: Used bed rail.  Transfers Overall transfer level: Needs assistance Equipment used: Rolling walker (2 wheeled) Transfers: Sit to/from Stand Sit to Stand: Min guard         General transfer comment: Patient ambulated in room with RW to [facility]. One LOB that patient corrected. Reports poor balance at baseline.    Balance Overall balance assessment: Needs assistance Sitting-balance support: No upper extremity supported;Feet supported Sitting balance-Leahy Scale: Good     Standing balance support: Bilateral upper extremity supported Standing balance-Leahy Scale: Poor                             ADL either performed or  assessed with clinical judgement   ADL Overall ADL's : Needs assistance/impaired Eating/Feeding: Independent   Grooming: Set up   Upper Body Bathing: Set up   Lower Body Bathing: Minimal assistance;Sit to/from stand   Upper Body Dressing : Set up   Lower Body Dressing: Minimal assistance   Toilet Transfer: Min guard;BSC   Toileting- Clothing Manipulation and Hygiene: Min guard   Tub/ Shower Transfer: Min guard;Shower seat   Functional mobility during ADLs: Min guard General ADL Comments: Patient unsteady. Using RW during evaluation. typically uses a rollator. Had one LOB that she corrected with walker. Reports this is "Normal"     Vision   Vision Assessment?: No apparent visual deficits     Perception     Praxis      Pertinent Vitals/Pain Pain Assessment: No/denies pain     Hand Dominance     Extremity/Trunk Assessment Upper Extremity Assessment Upper Extremity Assessment: Overall WFL for tasks assessed   Lower Extremity Assessment Lower Extremity Assessment: Defer to PT evaluation   Cervical / Trunk Assessment Cervical / Trunk Assessment: Normal   Communication Communication Communication: No difficulties   Cognition Arousal/Alertness: Awake/alert Behavior During Therapy: WFL for tasks assessed/performed Overall Cognitive Status: Within Functional Limits for tasks assessed                                     General Comments       Exercises     Shoulder Instructions  Home Living Family/patient expects to be discharged to:: Private residence Living Arrangements: Children Available Help at Discharge: Family Type of Home: House Home Access: Stairs to enter Technical brewer of Steps: 1 Entrance Stairs-Rails: None Home Layout: Able to live on main level with bedroom/bathroom           Bathroom Accessibility: Yes   Home Equipment: Cane - single point;Walker - 4 wheels;Shower seat;Bedside commode   Additional  Comments: has hemiwalker that she uses to step into shower      Prior Functioning/Environment Level of Independence: Independent with assistive device(s)        Comments: Has grandchildren assist with donning socks. Uses a shoe horn for shoes. Doesn't like to wear socks or shoes at home.        OT Problem List: Decreased activity tolerance;Impaired balance (sitting and/or standing)      OT Treatment/Interventions: Self-care/ADL training;Therapeutic activities;Patient/family education    OT Goals(Current goals can be found in the care plan section) Acute Rehab OT Goals Patient Stated Goal: home soon OT Goal Formulation: With patient Time For Goal Achievement: 09/30/19 Potential to Achieve Goals: Good  OT Frequency: Min 2X/week   Barriers to D/C:            Co-evaluation              AM-PAC OT "6 Clicks" Daily Activity     Outcome Measure Help from another person eating meals?: None Help from another person taking care of personal grooming?: None Help from another person toileting, which includes using toliet, bedpan, or urinal?: A Little Help from another person bathing (including washing, rinsing, drying)?: A Little Help from another person to put on and taking off regular upper body clothing?: A Little Help from another person to put on and taking off regular lower body clothing?: A Little 6 Click Score: 20   End of Session Equipment Utilized During Treatment: Gait belt;Rolling walker Nurse Communication: Mobility status  Activity Tolerance: Patient tolerated treatment well Patient left: in bed;with call bell/phone within reach  OT Visit Diagnosis: Unsteadiness on feet (R26.81)                Time: 1510-1530 OT Time Calculation (min): 20 min Charges:  OT General Charges $OT Visit: 1 Visit OT Evaluation $OT Eval Low Complexity: 1 Low  Hurman Ketelsen, OTR/L Acute Care Rehab Services  Office [phone]   Lenward Chancellor 09/15/2019, 3:44 PM

## 2019-09-15 NOTE — Progress Notes (Signed)
Patient has been in and out of A Fib this shift. Only symptom expressed is slight "winded" with activity. No chest pain or palpitations. Rate has been controlled since AM prn given. Continue to monitor. Eulas Post, RN

## 2019-09-15 NOTE — Progress Notes (Signed)
PROGRESS NOTE    [name] Molina  [id number] DOB: 18-Apr-1946 DOA: 09/12/2019 PCP: Harlan Stains, MD    Brief Narrative:  [name] Molina is a 74 year old Caucasian female with past medical history remarkable for COPD, asthma, GERD, HLD, hypertension, atrial fibrillation, macular degeneration who presents to the ED with progressive shortness of breath, coughing and wheezing.  Patient noted to have SPO2 86% on room air and placed on 2 L nasal cannula.  Patient was treated with neb treatments and admitted for further evaluation and work-up.   Assessment & Plan:   Principal Problem:   Acute respiratory failure with hypoxia (HCC) Active Problems:   Hypertension   GERD (gastroesophageal reflux disease)   Hyperlipidemia   Obstructive sleep apnea   COPD exacerbation (HCC)   Acute respiratory failure (HCC)   Acute respiratory failure with hypoxia Acute COPD exacerbation Patient presenting with progressive shortness of breath, notably hypoxic on presentation with SPO2 86%; not on home oxygen.  Chest x-ray unrevealing, afebrile without leukocytosis. --Continue to taper off steroids, start prednisone 40 mg p.o. daily today --Continue duo nebs/albuterol prn --Singulair 10 mg p.o. nightly --Anoro-Ellipta 62.5-25mcg 1 puff daily --Continue supplemental oxygen, maintain SPO2 greater than 88% --6-minute walk test pending  Chronic diastolic congestive heart failure BNP slightly elevated 335.9.  Was receiving IV fluids initially on admission.  On Lasix 20 mg p.o. daily at home.  2D echocardiogram 02/2018 with EF 60-65% with grade 2 diastolic dysfunction.  Chest x-ray on admission with no acute cardiopulmonary disease process.  Repeat chest x-ray this morning shows left base atelectasis, otherwise clear. --Lasix 40 mg IV every 12 hours --Strict I's and O's and daily weights --Monitor renal function closely while receiving aggressive IV diuresis  Essential hypertension Hx paroxysmal atrial  tachycardia Patient follows with cardiology, Dr. Marlou Porch outpatient.  Patient with episode of tachycardia while using the bedside commode this morning. --IV metoprolol 5 mg x 1 now --Increased Cardizem CD to 300 mg p.o. daily today --Continue valsartan/HCTZ 160-12.5 mg 1/2 tablet p.o. daily --Continue aspirin and statin --Continue to monitor on telemetry  GERD: Continue Protonix 40 mg p.o. daily  HLD: Continue pravastatin 40 mg p.o. nightly  Depression: Continue Prozac 40 mg p.o. daily  Obesity Body mass index is 44.19 kg/m.  Discussed with patient needs for aggressive weight loss measures/lifestyle changes as this complicates all facets of care.   DVT prophylaxis: Lovenox Code Status: Full code Family Communication: Discussed with the patient at bedside extensively.  Disposition Plan:  Status is: Inpatient  Remains inpatient appropriate because:Ongoing diagnostic testing needed not appropriate for outpatient work up, Unsafe d/c plan, IV treatments appropriate due to intensity of illness or inability to take PO and Inpatient level of care appropriate due to severity of illness   Dispo: The patient is from: Home              Anticipated d/c is to: Home vs SNF              Anticipated d/c date is: 1 day              Patient currently is not medically stable to d/c.   Consultants:   none  Procedures:   None  Antimicrobials:   Cefepime 4/25 - 4/27  Doxycycline 4/28>>  Cefdinir 4/28>>  Anti-infectives (From admission, onward)   Start     Dose/Rate Route Frequency Ordered Stop   09/14/19 2200  doxycycline (VIBRA-TABS) tablet 100 mg     100 mg Oral  Every 12 hours 09/14/19 1445 09/21/19 2159   09/14/19 2200  cefdinir (OMNICEF) capsule 300 mg     300 mg Oral Every 12 hours 09/14/19 1445 09/19/19 2159   09/12/19 2000  ceFEPIme (MAXIPIME) 2 g in sodium chloride 0.9 % 100 mL IVPB  Status:  Discontinued     2 g 200 mL/hr over 30 Minutes Intravenous Every 8 hours 09/12/19  1924 09/14/19 1445      Subjective: Patient seen and examined at bedside, resting comfortably.  Patient states he feels better this morning; especially that "ill feeling" has resolved.  Currently off of supplemental oxygen. No other complaints or concerns at this time.  Denies headache, no fever/chills/night sweats, no chest pain, no palpitations, no abdominal pain.  No other acute events overnight per nursing staff.  Called by nursing later this morning at 959am that when patient went to the bedside commode, her heart rate has jumped up once again into the 150s.  No significant symptoms.  Will give one-time dose IV metoprolol 5 mg now.  I have increased her dose of Cardizem to 300 mg daily this morning.  We will continue to monitor on telemetry.  Objective: Vitals:   09/15/19 0920 09/15/19 1040 09/15/19 1051 09/15/19 1101  BP: (!) 153/55 132/79 132/74 124/62  Pulse: (!) 120 (!) 131 (!) 112   Resp:      Temp: 97.7 F (36.5 C)     TempSrc: Oral     SpO2: 96% 95% 96%   Weight:      Height:        Intake/Output Summary (Last 24 hours) at 09/15/2019 1148 Last data filed at 09/14/2019 2005 Gross per 24 hour  Intake 200 ml  Output 250 ml  Net -50 ml   Filed Weights   09/12/19 2243 09/15/19 0545  Weight: 106.6 kg 109.6 kg    Examination:  General exam: Appears calm and comfortable  Respiratory system: Clear to auscultation. Respiratory effort normal. Cardiovascular system: S1 & S2 heard, RRR. No JVD, murmurs, rubs, gallops or clicks. No pedal edema. Gastrointestinal system: Abdomen is nondistended, soft and nontender. No organomegaly or masses felt. Normal bowel sounds heard. Central nervous system: Alert and oriented. No focal neurological deficits. Extremities: Symmetric 5 x 5 power. Skin: No rashes, lesions or ulcers Psychiatry: Judgement and insight appear normal. Mood & affect appropriate.     Data Reviewed: I have personally reviewed following labs and imaging  studies  CBC: Recent Labs  Lab 09/12/19 1352 09/13/19 0427 09/14/19 0344 09/15/19 0319  WBC 6.6 5.4 9.9 7.8  NEUTROABS 5.3  --   --   --   HGB 14.2 12.9 12.5 12.0  HCT 44.5 40.3 39.7 38.4  MCV 86.4 85.9 87.1 87.7  PLT 165 159 179 [id number]   Basic Metabolic Panel: Recent Labs  Lab 09/12/19 1352 09/14/19 0344 09/15/19 0319  NA 141 139 139  K 3.9 4.2 4.0  CL 105 106 105  CO2 26 24 24   GLUCOSE 112* 178* 218*  BUN 10 24* 31*  CREATININE 0.67 0.78 0.85  CALCIUM 8.9 9.0 8.8*   GFR: Estimated Creatinine Clearance: 66.7 mL/min (by C-G formula based on SCr of 0.85 mg/dL). Liver Function Tests: No results for input(s): AST, ALT, ALKPHOS, BILITOT, PROT, ALBUMIN in the last 168 hours. No results for input(s): LIPASE, AMYLASE in the last 168 hours. No results for input(s): AMMONIA in the last 168 hours. Coagulation Profile: No results for input(s): INR, PROTIME in the last 168 hours.  Cardiac Enzymes: No results for input(s): CKTOTAL, CKMB, CKMBINDEX, TROPONINI in the last 168 hours. BNP (last 3 results) No results for input(s): PROBNP in the last 8760 hours. HbA1C: No results for input(s): HGBA1C in the last 72 hours. CBG: No results for input(s): GLUCAP in the last 168 hours. Lipid Profile: No results for input(s): CHOL, HDL, LDLCALC, TRIG, CHOLHDL, LDLDIRECT in the last 72 hours. Thyroid Function Tests: No results for input(s): TSH, T4TOTAL, FREET4, T3FREE, THYROIDAB in the last 72 hours. Anemia Panel: No results for input(s): VITAMINB12, FOLATE, FERRITIN, TIBC, IRON, RETICCTPCT in the last 72 hours. Sepsis Labs: No results for input(s): PROCALCITON, LATICACIDVEN in the last 168 hours.  Recent Results (from the past 240 hour(s))  Respiratory Panel by RT PCR (Flu A&B, Covid) - Nasopharyngeal Swab     Status: None   Collection Time: 09/12/19  2:15 PM   Specimen: Nasopharyngeal Swab  Result Value Ref Range Status   SARS Coronavirus 2 by RT PCR NEGATIVE NEGATIVE Final     Comment: (NOTE) SARS-CoV-2 target nucleic acids are NOT DETECTED. The SARS-CoV-2 RNA is generally detectable in upper respiratoy specimens during the acute phase of infection. The lowest concentration of SARS-CoV-2 viral copies this assay can detect is 131 copies/mL. A negative result does not preclude SARS-Cov-2 infection and should not be used as the sole basis for treatment or other patient management decisions. A negative result may occur with  improper specimen collection/handling, submission of specimen other than nasopharyngeal swab, presence of viral mutation(s) within the areas targeted by this assay, and inadequate number of viral copies (<131 copies/mL). A negative result must be combined with clinical observations, patient history, and epidemiological information. The expected result is Negative. Fact Sheet for Patients:  PinkCheek.be Fact Sheet for Healthcare Providers:  GravelBags.it This test is not yet ap proved or cleared by the [place] FDA and  has been authorized for detection and/or diagnosis of SARS-CoV-2 by FDA under an Emergency Use Authorization (EUA). This EUA will remain  in effect (meaning this test can be used) for the duration of the COVID-19 declaration under Section 564(b)(1) of the Act, 21 U.S.C. section 360bbb-3(b)(1), unless the authorization is terminated or revoked sooner.    Influenza A by PCR NEGATIVE NEGATIVE Final   Influenza B by PCR NEGATIVE NEGATIVE Final    Comment: (NOTE) The Xpert Xpress SARS-CoV-2/FLU/RSV assay is intended as an aid in  the diagnosis of influenza from Nasopharyngeal swab specimens and  should not be used as a sole basis for treatment. Nasal washings and  aspirates are unacceptable for Xpert Xpress SARS-CoV-2/FLU/RSV  testing. Fact Sheet for Patients: PinkCheek.be Fact Sheet for Healthcare  Providers: GravelBags.it This test is not yet approved or cleared by the [place] FDA and  has been authorized for detection and/or diagnosis of SARS-CoV-2 by  FDA under an Emergency Use Authorization (EUA). This EUA will remain  in effect (meaning this test can be used) for the duration of the  Covid-19 declaration under Section 564(b)(1) of the Act, 21  U.S.C. section 360bbb-3(b)(1), unless the authorization is  terminated or revoked. Performed at [facility], [place] [address], [place], [place] [address]          Radiology Studies: DG CHEST PORT 1 VIEW  Result Date: 09/15/2019 CLINICAL DATA:  Shortness of breath EXAM: PORTABLE CHEST 1 VIEW COMPARISON:  September 12, 2019 FINDINGS: There is atelectatic change in the left base. Lungs elsewhere are clear. Heart is borderline enlarged with pulmonary vascularity  normal. No adenopathy. There is aortic atherosclerosis. No bone lesions. IMPRESSION: Left base atelectasis. Lungs elsewhere clear. Heart borderline enlarged. No adenopathy. Aortic Atherosclerosis (ICD10-I70.0). Electronically Signed   By: Lowella Grip III M.D.   On: 09/15/2019 09:04        Scheduled Meds: . aspirin EC  81 mg Oral Daily  . cefdinir  300 mg Oral Q12H  . diltiazem  300 mg Oral Daily  . doxycycline  100 mg Oral Q12H  . enoxaparin (LOVENOX) injection  0.5 mg/kg Subcutaneous QHS  . feeding supplement (ENSURE ENLIVE)  237 mL Oral BID BM  . FLUoxetine  40 mg Oral q morning - 10a  . fluticasone  1 puff Inhalation BID  . furosemide  40 mg Intravenous BID  . guaiFENesin  600 mg Oral BID  . irbesartan  75 mg Oral Daily  . loratadine  5 mg Oral Daily  . montelukast  10 mg Oral QHS  . pantoprazole  40 mg Oral Daily  . pravastatin  40 mg Oral QHS  . predniSONE  40 mg Oral Q breakfast  . umeclidinium-vilanterol  1 puff Inhalation Daily   Continuous Infusions:    LOS: 2 days    Time spent: 37 minutes spent  on chart review, discussion with nursing staff, consultants, updating family and interview/physical exam; more than 50% of that time was spent in counseling and/or coordination of care.    Daphne Karrer J [place] DO [facility] Available via Epic secure chat 7am-7pm After these hours, please refer to coverage provider listed on [email or web address] 09/15/2019, 11:48 AM

## 2019-09-15 NOTE — Progress Notes (Signed)
   09/15/19 0920  Assess: MEWS Score  Temp 97.7 F (36.5 C)  BP (!) 153/55  Pulse Rate (!) 120  Level of Consciousness Alert  SpO2 96 %  O2 Device Room Air  Assess: MEWS Score  MEWS Temp 0  MEWS Systolic 0  MEWS Pulse 2  MEWS RR 0  MEWS LOC 0  MEWS Score 2  MEWS Score Color Yellow  Patient converted to A Fib RVR rate 120's-150's. Some SOB when up to [facility]. Md notified

## 2019-09-15 NOTE — TOC Progression Note (Signed)
Transition of Care [facility]) - Progression Note    Patient Details  Name: [name] Molina MRN: [id number] Date of Birth: 1945/11/11  [facility] CM/SW Contact  Ross Ludwig, [place] Phone Number: 09/15/2019, 3:47 PM  Clinical Narrative:     CSW presented choice of home health agencies, patient said she did not have a preference.  [facility] accepted patient for [facility] PT, CSW to continue to follow patient's progress throughout discharge planning.       Expected Discharge Plan and Services                                                 Social Determinants of Health (SDOH) Interventions    Readmission Risk Interventions No flowsheet data found.

## 2019-09-16 ENCOUNTER — Inpatient Hospital Stay (HOSPITAL_COMMUNITY): Payer: Medicare Other

## 2019-09-16 DIAGNOSIS — F329 Major depressive disorder, single episode, unspecified: Secondary | ICD-10-CM | POA: Diagnosis not present

## 2019-09-16 DIAGNOSIS — I480 Paroxysmal atrial fibrillation: Secondary | ICD-10-CM | POA: Diagnosis not present

## 2019-09-16 DIAGNOSIS — I4891 Unspecified atrial fibrillation: Secondary | ICD-10-CM | POA: Diagnosis not present

## 2019-09-16 DIAGNOSIS — G43009 Migraine without aura, not intractable, without status migrainosus: Secondary | ICD-10-CM | POA: Diagnosis not present

## 2019-09-16 DIAGNOSIS — J4520 Mild intermittent asthma, uncomplicated: Secondary | ICD-10-CM | POA: Diagnosis not present

## 2019-09-16 DIAGNOSIS — M19041 Primary osteoarthritis, right hand: Secondary | ICD-10-CM | POA: Diagnosis not present

## 2019-09-16 DIAGNOSIS — M19042 Primary osteoarthritis, left hand: Secondary | ICD-10-CM | POA: Diagnosis not present

## 2019-09-16 DIAGNOSIS — D509 Iron deficiency anemia, unspecified: Secondary | ICD-10-CM | POA: Diagnosis not present

## 2019-09-16 DIAGNOSIS — E785 Hyperlipidemia, unspecified: Secondary | ICD-10-CM | POA: Diagnosis not present

## 2019-09-16 DIAGNOSIS — J449 Chronic obstructive pulmonary disease, unspecified: Secondary | ICD-10-CM | POA: Diagnosis not present

## 2019-09-16 DIAGNOSIS — I10 Essential (primary) hypertension: Secondary | ICD-10-CM | POA: Diagnosis not present

## 2019-09-16 LAB — CBC
HCT: 42.6 % (ref 36.0–46.0)
Hemoglobin: 13.4 g/dL (ref 12.0–15.0)
MCH: 27 pg (ref 26.0–34.0)
MCHC: 31.5 g/dL (ref 30.0–36.0)
MCV: 85.9 fL (ref 80.0–100.0)
Platelets: 214 10*3/uL (ref 150–400)
RBC: 4.96 MIL/uL (ref 3.87–5.11)
RDW: 15.1 % (ref 11.5–15.5)
WBC: 7.9 10*3/uL (ref 4.0–10.5)
nRBC: 0 % (ref 0.0–0.2)

## 2019-09-16 LAB — BASIC METABOLIC PANEL
Anion gap: 11 (ref 5–15)
BUN: 33 mg/dL — ABNORMAL HIGH (ref 8–23)
CO2: 25 mmol/L (ref 22–32)
Calcium: 8.7 mg/dL — ABNORMAL LOW (ref 8.9–10.3)
Chloride: 105 mmol/L (ref 98–111)
Creatinine, Ser: 0.82 mg/dL (ref 0.44–1.00)
GFR calc Af Amer: >60 mL/min (ref >60)
GFR calc non Af Amer: >60 mL/min (ref >60)
Glucose, Bld: 132 mg/dL — ABNORMAL HIGH (ref 70–99)
Potassium: 3.5 mmol/L (ref 3.5–5.1)
Sodium: 141 mmol/L (ref 135–145)

## 2019-09-16 LAB — ECHOCARDIOGRAM COMPLETE
Height: 62 in
Weight: 3816 oz

## 2019-09-16 LAB — TSH: TSH: 1.381 u[IU]/mL (ref 0.350–4.500)

## 2019-09-16 LAB — MAGNESIUM: Magnesium: 2.2 mg/dL (ref 1.7–2.4)

## 2019-09-16 MED ORDER — FUROSEMIDE 40 MG PO TABS
40.0000 mg | ORAL_TABLET | Freq: Every day | ORAL | Status: DC
  Administered 2019-09-17 – 2019-09-18 (×2): 40 mg via ORAL
  Filled 2019-09-16 (×2): qty 1

## 2019-09-16 MED ORDER — METOPROLOL TARTRATE 25 MG PO TABS
25.0000 mg | ORAL_TABLET | Freq: Four times a day (QID) | ORAL | Status: DC
  Administered 2019-09-16 – 2019-09-17 (×4): 25 mg via ORAL
  Filled 2019-09-16 (×4): qty 1

## 2019-09-16 MED ORDER — DILTIAZEM HCL ER COATED BEADS 180 MG PO CP24
360.0000 mg | ORAL_CAPSULE | Freq: Every day | ORAL | Status: DC
  Administered 2019-09-16 – 2019-09-18 (×3): 360 mg via ORAL
  Filled 2019-09-16 (×3): qty 2

## 2019-09-16 NOTE — Consult Note (Addendum)
Cardiology Consultation:   Patient ID: [name] Molina MRN: [id number]; DOB: 07-26-1945  Admit date: 09/12/2019 Date of Consult: 09/16/2019  Primary Care Provider: Harlan Stains, MD Primary Cardiologist: Candee Furbish, MD  Primary Electrophysiologist:  None    Patient Profile:   [name] Molina is a 74 y.o. female with a hx of chronic diastolic heart failure, paroxysmal atrial tachycardia, moderate mitral annular calcification, COPD, morbid obesity, HTN, and HLD who is being seen today for the evaluation of Afib RVR - new onset at the request of Dr. [place]  History of Present Illness:   [name] Molina was last seen by Dr. Marlou Porch in 2019 following a bout of dyspnea, edema, orthopnea, and flash pulmonary edema. She was treated with lasix and followed with pulmonology. CT chest did not show coronary atherosclerosis. Echocardiogram with preserved EF and grade II DD, moderate MAC. She has not been seen since.   She presented to [facility] with onset of shortness of breath and wheezing. She received [employer] COVID-19 vaccine 2 weeks ago. She reported nonproductive cough and nasal drainage x 3 days. COVID-19 negative. She was febrile with 100.8 temp, mildly hypertensive 163/99 HR 134. CXR negative for edema. She was admitted for treatment of COPD exacerbation.   BNP 336 CXR no pulmonary vascular congestion  She was given 40 mg IV lasix x 4 doses with diuresis.   Unfortunately, she converted to Afib RVR and has been transitioning between sinus and Afib RVR. Cardiology was consulted.  On my interview, she does not have a history of Afib and has never been on anticoagulation. She denies chest pain and occasionally feels chest fluttering with her RVR. She is tolerating the rhythm well. Breathing is at baseline. She is very deconditioned and can't walk past the bathroom into the hall. PT is on board. She has a history of falling - she has suffered two falls since December and thinks she falls about every 2 months. She  denies dizziness, palpitations, chest pain, and loss of consciousness with these falls.     Past Medical History:  Diagnosis Date  . Anxiety   . Asthma   . Atrial tachycardia ([place])   . Cataract   . COPD (chronic obstructive pulmonary disease) ([place])   . Diverticulosis   . GERD (gastroesophageal reflux disease)   . Hyperlipidemia   . Hypertension   . Insomnia   . Macular degeneration   . Migraines   . Neuropathy     Past Surgical History:  Procedure Laterality Date  . CERVICAL POLYPECTOMY     INSIDE AND OUTSIDE  . COLONOSCOPY    . POLYPECTOMY    . REMOVED CARTLIAGE RIB       Home Medications:  Prior to Admission medications   Medication Sig Start Date End Date Taking? Authorizing Provider  acetaminophen (TYLENOL) 500 MG tablet Take 1,000 mg by mouth every 6 (six) hours as needed for moderate pain or headache.   Yes [provider]  albuterol (PROVENTIL HFA;VENTOLIN HFA) 108 (90 Base) MCG/ACT inhaler Inhale 2 puffs into the lungs every 6 (six) hours as needed for wheezing or shortness of breath. 03/05/18  Yes Collene Gobble, MD  aspirin 81 MG tablet Take 81 mg by mouth daily.     Yes [provider]  aspirin-acetaminophen-caffeine (EXCEDRIN MIGRAINE) [phone] MG per tablet Take 2 tablets by mouth every 6 (six) hours as needed for headache.   Yes [provider]  BESIVANCE 0.6 % SUSP Place 1 drop into the left eye  See admin instructions. Instill 1 drop qid day of eye injection and qid the day after. 09/07/19  Yes [provider]  CARTIA XT 240 MG 24 hr capsule TAKE ONE CAPSULE BY MOUTH DAILY Patient taking differently: Take 240 mg by mouth daily.  03/19/16  Yes Jerline Pain, MD  clobetasol cream (TEMOVATE) 4.40 % Apply 1 application topically daily as needed (yeast.).  11/01/13  Yes [provider]  FLUoxetine (PROZAC) 40 MG capsule Take 40 mg by mouth every morning. 11/19/16  Yes [provider]  furosemide (LASIX) 20 MG  tablet TAKE 1 TABLET (20 MG TOTAL) BY MOUTH DAILY AS NEEDED. PLEASE MAKE OVERDUE APPT WITH DR. Marlou Porch BEFORE ANYMORE REFILLS. 1ST ATTEMPT Patient taking differently: Take 20 mg by mouth daily as needed for fluid. Please make overdue appt with Dr. Marlou Porch before anymore refills. 1st attempt 02/23/19  Yes Jerline Pain, MD  ibuprofen (ADVIL,MOTRIN) 200 MG tablet Take 400 mg by mouth every 6 (six) hours as needed for headache or moderate pain.   Yes [provider]  lactase (LACTAID) 3000 units tablet Take 3,000 Units by mouth 3 (three) times daily as needed (lactose ingestion).    Yes [provider]  loratadine-pseudoephedrine (CLARITIN-D 12-HOUR) 5-120 MG per tablet Take 1 tablet by mouth daily.   Yes [provider]  LORazepam (ATIVAN) 0.5 MG tablet Take 0.5-1 mg by mouth daily as needed for anxiety. Anxiety   Yes [provider]  montelukast (SINGULAIR) 10 MG tablet Take 10 mg by mouth daily as needed.  02/21/14  Yes [provider]  Multiple Vitamins-Minerals (OCUVITE PO) Take 1 tablet by mouth 2 (two) times daily.    Yes [provider]  omeprazole (PRILOSEC) 20 MG capsule Take 20 mg by mouth at bedtime.    Yes [provider]  pravastatin (PRAVACHOL) 40 MG tablet Take 40 mg by mouth at bedtime.  09/04/10  Yes [provider]  tizanidine (ZANAFLEX) 2 MG capsule Take 2 mg by mouth at bedtime as needed for muscle spasms (head pain per pt).    Yes [provider]  valsartan-hydrochlorothiazide (DIOVAN-HCT) 160-12.5 MG tablet Take 0.5 tablets by mouth daily.    Yes [provider]  benzonatate (TESSALON) 200 MG capsule Take 1 capsule (200 mg total) by mouth 3 (three) times daily as needed for cough. Patient not taking: Reported on 09/12/2019 07/22/18   Martyn Ehrich, NP  fluticasone [facility]) 50 MCG/ACT nasal spray Place 1 spray into both nostrils daily. Patient not taking: Reported on 09/12/2019 07/19/16   Collene Gobble, MD  Fluticasone-Umeclidin-Vilant (TRELEGY ELLIPTA) 100-62.5-25 MCG/INH AEPB Take 1 puff by mouth daily. 09/17/18   Lauraine Rinne, NP  HYDROcodone-acetaminophen (NORCO/VICODIN) 5-325 MG tablet Take 1-2 tablets by mouth every 6 (six) hours as needed for moderate pain or severe pain. Patient not taking: Reported on 09/12/2019 05/09/19   Charlesetta Shanks, MD    Inpatient Medications: Scheduled Meds: . aspirin EC  81 mg Oral Daily  . cefdinir  300 mg Oral Q12H  . diltiazem  360 mg Oral Daily  . doxycycline  100 mg Oral Q12H  . enoxaparin (LOVENOX) injection  0.5 mg/kg Subcutaneous QHS  . feeding supplement (ENSURE ENLIVE)  237 mL Oral BID BM  . FLUoxetine  40 mg Oral q morning - 10a  . fluticasone  1 puff Inhalation BID  . guaiFENesin  600 mg Oral BID  . irbesartan  75 mg Oral Daily  . loratadine  5 mg Oral Daily  . montelukast  10 mg Oral QHS  . pantoprazole  40 mg Oral Daily  . pravastatin  40 mg Oral QHS  . predniSONE  40 mg Oral Q breakfast  . umeclidinium-vilanterol  1 puff Inhalation Daily   Continuous Infusions:  PRN Meds: acetaminophen, aspirin-acetaminophen-caffeine, clobetasol cream, ibuprofen, lactase, levalbuterol, LORazepam, ondansetron **OR** ondansetron (ZOFRAN) IV, phenol, tiZANidine  Allergies:    Allergies  Allergen Reactions  . Food Shortness Of Breath    ALLERGY= MUSHROOMS  . Tequin Shortness Of Breath  . Codeine Nausea And Vomiting  . Erythromycin Other (See Comments)    GI UPSET  . Levaquin [Levofloxacin In D5w] Other (See Comments)    "made me want to die."  . Wellbutrin [Bupropion Hcl] Other (See Comments)    SHAKING  . Penicillins Rash  . Prednisone Rash    Social History:   Social History   Socioeconomic History  . Marital status: Divorced    Spouse name: Not on file  . Number of children: 3  . Years of education: College  . Highest education level: Not on file  Occupational History  . Occupation: Emergency planning/management officer  Tobacco Use    . Smoking status: Former Smoker    Packs/day: 1.00    Years: 20.00    Pack years: 20.00    Types: Cigarettes    Quit date: 05/21/1991    Years since quitting: 28.3  . Smokeless tobacco: Never Used  Substance and Sexual Activity  . Alcohol use: No  . Drug use: No  . Sexual activity: Not on file  Other Topics Concern  . Not on file  Social History Narrative   Patient is divorced with 3 children.   Patient is right handed.   Patient has a college education.   Patient drinks 1-2 servings daily.   Social Determinants of Health   Financial Resource Strain:   . Difficulty of Paying Living Expenses:   Food Insecurity:   . Worried About Charity fundraiser in the Last Year:   . Arboriculturist in the Last Year:   Transportation Needs:   . Film/video editor (Medical):   Marland Kitchen Lack of Transportation (Non-Medical):   Physical Activity:   . Days of Exercise per Week:   . Minutes of Exercise per Session:   Stress:   . Feeling of Stress :   Social Connections:   . Frequency of Communication with Friends and Family:   . Frequency of Social Gatherings with Friends and Family:   . Attends Religious Services:   . Active Member of Clubs or Organizations:   . Attends Archivist Meetings:   Marland Kitchen Marital Status:   Intimate Partner Violence:   . Fear of Current or Ex-Partner:   . Emotionally Abused:   Marland Kitchen Physically Abused:   . Sexually Abused:     Family History:    Family History  Problem Relation Age of Onset  . Colon polyps Sister 27       PRECANCEROUS POLYPS  . Breast cancer Maternal Grandmother 53  . Emphysema Father   . Asthma Father   . Heart disease Father   . Heart disease Paternal Grandfather   . Colon cancer Neg Hx      ROS:  Please see the history of present illness.   All other ROS reviewed and negative.     Physical Exam/Data:   Vitals:   09/16/19 0538 09/16/19 0956 09/16/19 1100 09/16/19 1200  BP:  (!) 130/58 (!) 131/94 136/68  Pulse:   (!) 123  (!) 108  Resp:    20  Temp:    98.4 F (36.9 C)  TempSrc:      SpO2:    93%  Weight: 108.2 kg     Height:        Intake/Output Summary (Last 24 hours) at 09/16/2019 1247 Last data filed at 09/16/2019 1241 Gross per 24 hour  Intake --  Output 2200 ml  Net -2200 ml   Last 3 Weights 09/16/2019 09/15/2019 09/12/2019  Weight (lbs) 238 lb 8 oz 241 lb 10 oz 235 lb 0.2 oz  Weight (kg) 108.183 kg 109.6 kg 106.6 kg     Body mass index is 43.62 kg/m.  General:  Obese female in NAD HEENT: normal Neck: no JVD Vascular: No carotid bruits Cardiac:  Irregular rhythm, tachycardic rate Lungs:  clear to auscultation bilaterally, no wheezing, rhonchi or rales  Abd: soft, nontender, no hepatomegaly  Ext: no edema Musculoskeletal:  No deformities, BUE and BLE strength normal and equal Skin: warm and dry  Neuro:  CNs 2-12 intact, no focal abnormalities noted Psych:  Normal affect   EKG:  The EKG was personally reviewed and demonstrates:  Atrial fibrillation with RVR - ventricular rate 112 Telemetry:  Telemetry was personally reviewed and demonstrates:  Bouts of sinus with Afib RVR 100-140s  Relevant CV Studies:  Echo 02/24/18: Study Conclusions   - Left ventricle: The cavity size was normal. There was mild  concentric hypertrophy. Systolic function was normal. The  estimated ejection fraction was in the range of 60% to 65%. Wall  motion was normal; there were no regional wall motion  abnormalities. Features are consistent with a pseudonormal left  ventricular filling pattern, with concomitant abnormal relaxation  and increased filling pressure (grade 2 diastolic dysfunction).  Doppler parameters are consistent with high ventricular filling  pressure.  - Mitral valve: Severely calcified annulus. Mildly thickened  leaflets . There was mild regurgitation.  - Left atrium: The atrium was mildly dilated.  - Atrial septum: There was increased thickness of the septum,   consistent with lipomatous hypertrophy.  - Tricuspid valve: There was trivial regurgitation.   Laboratory Data:  High Sensitivity Troponin:  No results for input(s): TROPONINIHS in the last 720 hours.   Chemistry Recent Labs  Lab 09/14/19 0344 09/15/19 0319 09/16/19 0351  NA 139 139 141  K 4.2 4.0 3.5  CL 106 105 105  CO2 _0 GLUCOSE 178* 218* 132*  BUN 24* 31* 33*  CREATININE 0.78 0.85 0.82  CALCIUM 9.0 8.8* 8.7*  GFRNONAA >60 >60 >60  GFRAA >60 >60 >60  ANIONGAP _1 No results for input(s): PROT, ALBUMIN, AST, ALT, ALKPHOS, BILITOT in the last 168 hours. Hematology Recent Labs  Lab 09/14/19 0344 09/15/19 0319 09/16/19 0351  WBC 9.9 7.8 7.9  RBC 4.56 4.38 4.96  HGB 12.5 12.0 13.4  HCT 39.7 38.4 42.6  MCV 87.1 87.7 85.9  MCH 27.4 27.4 27.0  MCHC 31.5 31.3 31.5  RDW 15.3 15.2 15.1  PLT 179 198 214   BNP Recent Labs  Lab 09/13/19 0427  BNP 335.9*    DDimer No results for input(s): DDIMER in the last 168 hours.   Radiology/Studies:  DG CHEST PORT 1 VIEW  Result Date: 09/15/2019 CLINICAL DATA:  Shortness of breath EXAM: PORTABLE CHEST 1 VIEW COMPARISON:  September 12, 2019 FINDINGS: There is atelectatic change in  the left base. Lungs elsewhere are clear. Heart is borderline enlarged with pulmonary vascularity normal. No adenopathy. There is aortic atherosclerosis. No bone lesions. IMPRESSION: Left base atelectasis. Lungs elsewhere clear. Heart borderline enlarged. No adenopathy. Aortic Atherosclerosis (ICD10-I70.0). Electronically Signed   By: Lowella Grip III M.D.   On: 09/15/2019 09:04   DG Chest Port 1 View  Result Date: 09/12/2019 CLINICAL DATA:  Cough, chills, body aches since Friday EXAM: PORTABLE CHEST 1 VIEW COMPARISON:  12/19/2015 FINDINGS: Single frontal view of the chest demonstrates a stable cardiac silhouette. Mild atherosclerosis of the aortic arch. No airspace disease, effusion, or pneumothorax. No acute bony abnormalities.  IMPRESSION: 1. No acute intrathoracic process. Electronically Signed   By: Randa Ngo M.D.   On: 09/12/2019 15:02    Assessment and Plan:   New onset atrial fibrillation with RVR - paroxysmal Hx of falls - appears to be a new diagnosis - she is not on anticoagulation - she falls about once every 2 months - sounds like mechanical falls, she is deconditioned - I will start 25 mg lopressor q6hr for rate control, hold off on cardizem drip as she is tolerating the rhythm well and echo pending   This patients CHA2DS2-VASc Score and unadjusted Ischemic Stroke Rate (% per year) is equal to 7.2 % stroke rate/year from a score of 5 (HTN, CHF, age2, female)  HAS-BLED score is 2 --> moderate risk of bleeding   She will need aggressive PT to help decrease incidence of mechanical falls. She uses a rolling walker at baseline.  - given the paroxysmal nature of her Afib, not a DCCV candidate - she has a history of hyperthyroidism - will check an echo, TSH, and electrolytes   Shortness of breath Elevated BNP Grade II DD COPD exacerbation - unclear why EKG has not been completed, will order - pt has received 4 doses of 40 mg IV lasix - BNP mildly elevated, but CXR negative for congestion - 2.4 L urine output yesterday - weight is 238 lbs from 241 lbs max - I suspect her COPD is driving her pulmonary issues - will hold further diuresis for now   Hypertension  - continue home ARB - but hold if pressure room needed to titrate rate controlling medications   Hyperlipidemia - update lipid panel - continue statin   OSA - encourage CPAP use - CPAP will help with Afib control   Morbid obesity - likely contributing to dyspnea      For questions or updates, please contact [place] Please consult [email or web address] for contact info under     Signed, Ledora Bottcher, PA  09/16/2019 12:47 PM   History and all data above reviewed.  Patient examined.  I agree with the findings as  above.  Patient presented with multiple complaints including joint pains and headaches and some shortness of breath.  She is not entirely sure that the breathing is much improved.  She is noted to have new onset atrial fibrillation.  In retrospect she does have occasional palpitations.  She is never been diagnosed with fibrillation.  She has been very limited because of balance issues.  She had smaller 3 times.  She does not describe syncope.  Today though she is in fibrillation she is really not noticing a lot this.  She denies any chest pressure, neck or arm discomfort.  She is not describing PND orthopnea.  The patient exam reveals KDT:[id number], no murmur,  Lungs: Clear to auscultation bilaterally,  Abd: Positive bowel  sounds, no rebound no guarding, Ext No edema  .  All available labs, radiology testing, previous records reviewed. Agree with documented assessment and plan. Atrial fib: This appears to be new.  Rate is not well controlled despite increasing dose of Cardizem.  We are starting beta-blocker today.  Given her high [id number] score she certainly should be on anticoagulation despite her risk of fall.  We had a long discussion about this.  We talked about patient preference and she is worried about falling when she is more worried about embolic stroke.  Jeneen Rinks Alane Hanssen  1:57 PM  09/16/2019'

## 2019-09-16 NOTE — Progress Notes (Signed)
  Echocardiogram 2D Echocardiogram has been performed.  [name] Molina 09/16/2019, 3:54 PM

## 2019-09-16 NOTE — Progress Notes (Signed)
Pt assisted out of bed Resting oxygen saturation 93%. Standing at side of bed Pt with c/o feeling slightly winded. Oxygen saturation with standing 93%. Pulse rate up to 135. Pt ambulated only a few steps and requested to go to the chair. Pt placed in recliner and heart rate decreased 95-105.

## 2019-09-16 NOTE — Progress Notes (Signed)
PROGRESS NOTE    JURNEY REINING  [id number] DOB: 06-03-1945 DOA: 09/12/2019 PCP: Harlan Stains, MD    Brief Narrative:  [name] Molina is a 74 year old Caucasian female with past medical history remarkable for COPD, asthma, GERD, HLD, hypertension, atrial fibrillation, macular degeneration who presents to the ED with progressive shortness of breath, coughing and wheezing.  Patient noted to have SPO2 86% on room air and placed on 2 L nasal cannula.  Patient was treated with neb treatments and admitted for further evaluation and work-up.   Assessment & Plan:   Principal Problem:   Acute respiratory failure with hypoxia (HCC) Active Problems:   Hypertension   GERD (gastroesophageal reflux disease)   Hyperlipidemia   Obstructive sleep apnea   COPD exacerbation (HCC)   Acute respiratory failure (HCC)   Acute respiratory failure with hypoxia Acute COPD exacerbation Patient presenting with progressive shortness of breath, notably hypoxic on presentation with SPO2 86%; not on home oxygen.  Chest x-ray unrevealing, afebrile without leukocytosis. --Continue to taper off steroids, on prednisone 40 mg p.o. daily x 5 days; End 5/2 --Continue duo nebs/albuterol prn --Singulair 10 mg p.o. nightly --Anoro-Ellipta 62.5-25mcg 1 puff daily --Continue supplemental oxygen, maintain SPO2 greater than 88%; currently on room air --6-minute walk test pending  Chronic diastolic congestive heart failure BNP slightly elevated 335.9.  Was receiving IV fluids initially on admission.  On Lasix 20 mg p.o. daily at home.  2D echocardiogram 02/2018 with EF 60-65% with grade 2 diastolic dysfunction.  Chest x-ray on admission with no acute cardiopulmonary disease process.  Repeat chest x-ray this morning shows left base atelectasis, otherwise clear. --Transition IV Lasix to Lasix 40 mg p.o. daily --Strict I's and O's and daily weights --Monitor renal function closely   Essential hypertension Hx paroxysmal  atrial tachycardia Patient follows with cardiology, Dr. Marlou Porch outpatient.  Patient continues with episodes of tachycardia that is symptomatic with any type of movement.  --Increased Cardizem CD to 360 mg p.o. daily today --Continue valsartan/HCTZ 160-12.5 mg 1/2 tablet p.o. daily --Continue aspirin and statin --Continue to monitor on telemetry --Cardiology consulted today for assistance with control of her atrial tachycardia  GERD: Continue Protonix 40 mg p.o. daily  HLD: Continue pravastatin 40 mg p.o. nightly  Depression: Continue Prozac 40 mg p.o. daily  Obesity Body mass index is 43.62 kg/m.  Discussed with patient needs for aggressive weight loss measures/lifestyle changes as this complicates all facets of care.   DVT prophylaxis: Lovenox Code Status: Full code Family Communication: Discussed with the patient at bedside extensively.  Disposition Plan:  Status is: Inpatient  Remains inpatient appropriate because:Ongoing diagnostic testing needed not appropriate for outpatient work up, Unsafe d/c plan, IV treatments appropriate due to intensity of illness or inability to take PO and Inpatient level of care appropriate due to severity of illness   Dispo: The patient is from: Home              Anticipated d/c is to: Home vs SNF              Anticipated d/c date is: 1 day              Patient currently is not medically stable to d/c.   Consultants:   Cardiology  Procedures:   None  Antimicrobials:   Cefepime 4/25 - 4/27  Doxycycline 4/28>>  Cefdinir 4/28>>  Anti-infectives (From admission, onward)   Start     Dose/Rate Route Frequency Ordered Stop   09/14/19 2200  doxycycline (VIBRA-TABS) tablet 100 mg     100 mg Oral Every 12 hours 09/14/19 1445 09/21/19 2159   09/14/19 2200  cefdinir (OMNICEF) capsule 300 mg     300 mg Oral Every 12 hours 09/14/19 1445 09/19/19 2159   09/12/19 2000  ceFEPIme (MAXIPIME) 2 g in sodium chloride 0.9 % 100 mL IVPB  Status:   Discontinued     2 g 200 mL/hr over 30 Minutes Intravenous Every 8 hours 09/12/19 1924 09/14/19 1445      Subjective: Patient seen and examined at bedside, resting comfortably.  When patient was trying to ambulate to the bathroom, her heart rate once again went up to the 150s in which she was symptomatic with dizziness and weakness.  Her Cardizem has now been uptitrated to 360 mg without any significant desired effect.  Will consult cardiology today for further evaluation and recommendations.  Updated patient's daughter Sharyn Lull via telephone this morning.  Patient continues off of supplemental oxygen.  No other complaints or concerns at this time. Denies headache, no fever/chills/night sweats, no chest pain, no abdominal pain.  No other acute events overnight per nursing staff.   Objective: Vitals:   09/16/19 0956 09/16/19 1100 09/16/19 1200 09/16/19 1329  BP: (!) 130/58 (!) 131/94 136/68 (!) 107/58  Pulse:  (!) 123 (!) 108 (!) 107  Resp:   20   Temp:   98.4 F (36.9 C) 98.4 F (36.9 C)  TempSrc:    Oral  SpO2:   93% 95%  Weight:      Height:        Intake/Output Summary (Last 24 hours) at 09/16/2019 1329 Last data filed at 09/16/2019 1241 Gross per 24 hour  Intake --  Output 2200 ml  Net -2200 ml   Filed Weights   09/12/19 2243 09/15/19 0545 09/16/19 0538  Weight: 106.6 kg 109.6 kg 108.2 kg    Examination:  General exam: Appears calm and comfortable  Respiratory system: Clear to auscultation. Respiratory effort normal.  On room air Cardiovascular system: S1 & S2 heard, RRR. No JVD, murmurs, rubs, gallops or clicks. No pedal edema. Gastrointestinal system: Abdomen is nondistended, soft and nontender. No organomegaly or masses felt. Normal bowel sounds heard. Central nervous system: Alert and oriented. No focal neurological deficits. Extremities: Symmetric 5 x 5 power. Skin: No rashes, lesions or ulcers Psychiatry: Judgement and insight appear normal. Mood & affect  appropriate.     Data Reviewed: I have personally reviewed following labs and imaging studies  CBC: Recent Labs  Lab 09/12/19 1352 09/13/19 0427 09/14/19 0344 09/15/19 0319 09/16/19 0351  WBC 6.6 5.4 9.9 7.8 7.9  NEUTROABS 5.3  --   --   --   --   HGB 14.2 12.9 12.5 12.0 13.4  HCT 44.5 40.3 39.7 38.4 42.6  MCV 86.4 85.9 87.1 87.7 85.9  PLT 165 159 179 198 [id number]   Basic Metabolic Panel: Recent Labs  Lab 09/12/19 1352 09/14/19 0344 09/15/19 0319 09/16/19 0351  NA 141 139 139 141  K 3.9 4.2 4.0 3.5  CL 105 106 105 105  CO2 26 24 24 25   GLUCOSE 112* 178* 218* 132*  BUN 10 24* 31* 33*  CREATININE 0.67 0.78 0.85 0.82  CALCIUM 8.9 9.0 8.8* 8.7*   GFR: Estimated Creatinine Clearance: 68.6 mL/min (by C-G formula based on SCr of 0.82 mg/dL). Liver Function Tests: No results for input(s): AST, ALT, ALKPHOS, BILITOT, PROT, ALBUMIN in the last 168 hours. No results for input(s): LIPASE,  AMYLASE in the last 168 hours. No results for input(s): AMMONIA in the last 168 hours. Coagulation Profile: No results for input(s): INR, PROTIME in the last 168 hours. Cardiac Enzymes: No results for input(s): CKTOTAL, CKMB, CKMBINDEX, TROPONINI in the last 168 hours. BNP (last 3 results) No results for input(s): PROBNP in the last 8760 hours. HbA1C: No results for input(s): HGBA1C in the last 72 hours. CBG: No results for input(s): GLUCAP in the last 168 hours. Lipid Profile: No results for input(s): CHOL, HDL, LDLCALC, TRIG, CHOLHDL, LDLDIRECT in the last 72 hours. Thyroid Function Tests: No results for input(s): TSH, T4TOTAL, FREET4, T3FREE, THYROIDAB in the last 72 hours. Anemia Panel: No results for input(s): VITAMINB12, FOLATE, FERRITIN, TIBC, IRON, RETICCTPCT in the last 72 hours. Sepsis Labs: No results for input(s): PROCALCITON, LATICACIDVEN in the last 168 hours.  Recent Results (from the past 240 hour(s))  Respiratory Panel by RT PCR (Flu A&B, Covid) - Nasopharyngeal Swab      Status: None   Collection Time: 09/12/19  2:15 PM   Specimen: Nasopharyngeal Swab  Result Value Ref Range Status   SARS Coronavirus 2 by RT PCR NEGATIVE NEGATIVE Final    Comment: (NOTE) SARS-CoV-2 target nucleic acids are NOT DETECTED. The SARS-CoV-2 RNA is generally detectable in upper respiratoy specimens during the acute phase of infection. The lowest concentration of SARS-CoV-2 viral copies this assay can detect is 131 copies/mL. A negative result does not preclude SARS-Cov-2 infection and should not be used as the sole basis for treatment or other patient management decisions. A negative result may occur with  improper specimen collection/handling, submission of specimen other than nasopharyngeal swab, presence of viral mutation(s) within the areas targeted by this assay, and inadequate number of viral copies (<131 copies/mL). A negative result must be combined with clinical observations, patient history, and epidemiological information. The expected result is Negative. Fact Sheet for Patients:  PinkCheek.be Fact Sheet for Healthcare Providers:  GravelBags.it This test is not yet ap proved or cleared by the [place] FDA and  has been authorized for detection and/or diagnosis of SARS-CoV-2 by FDA under an Emergency Use Authorization (EUA). This EUA will remain  in effect (meaning this test can be used) for the duration of the COVID-19 declaration under Section 564(b)(1) of the Act, 21 U.S.C. section 360bbb-3(b)(1), unless the authorization is terminated or revoked sooner.    Influenza A by PCR NEGATIVE NEGATIVE Final   Influenza B by PCR NEGATIVE NEGATIVE Final    Comment: (NOTE) The Xpert Xpress SARS-CoV-2/FLU/RSV assay is intended as an aid in  the diagnosis of influenza from Nasopharyngeal swab specimens and  should not be used as a sole basis for treatment. Nasal washings and  aspirates are unacceptable for  Xpert Xpress SARS-CoV-2/FLU/RSV  testing. Fact Sheet for Patients: PinkCheek.be Fact Sheet for Healthcare Providers: GravelBags.it This test is not yet approved or cleared by the [place] FDA and  has been authorized for detection and/or diagnosis of SARS-CoV-2 by  FDA under an Emergency Use Authorization (EUA). This EUA will remain  in effect (meaning this test can be used) for the duration of the  Covid-19 declaration under Section 564(b)(1) of the Act, 21  U.S.C. section 360bbb-3(b)(1), unless the authorization is  terminated or revoked. Performed at [facility], [place] [address] [place], [place] [address]          Radiology Studies: DG CHEST PORT 1 VIEW  Result Date: 09/15/2019 CLINICAL DATA:  Shortness of breath EXAM:  PORTABLE CHEST 1 VIEW COMPARISON:  September 12, 2019 FINDINGS: There is atelectatic change in the left base. Lungs elsewhere are clear. Heart is borderline enlarged with pulmonary vascularity normal. No adenopathy. There is aortic atherosclerosis. No bone lesions. IMPRESSION: Left base atelectasis. Lungs elsewhere clear. Heart borderline enlarged. No adenopathy. Aortic Atherosclerosis (ICD10-I70.0). Electronically Signed   By: Lowella Grip III M.D.   On: 09/15/2019 09:04        Scheduled Meds: . aspirin EC  81 mg Oral Daily  . cefdinir  300 mg Oral Q12H  . diltiazem  360 mg Oral Daily  . doxycycline  100 mg Oral Q12H  . enoxaparin (LOVENOX) injection  0.5 mg/kg Subcutaneous QHS  . feeding supplement (ENSURE ENLIVE)  237 mL Oral BID BM  . FLUoxetine  40 mg Oral q morning - 10a  . fluticasone  1 puff Inhalation BID  . guaiFENesin  600 mg Oral BID  . irbesartan  75 mg Oral Daily  . loratadine  5 mg Oral Daily  . metoprolol tartrate  25 mg Oral Q6H  . montelukast  10 mg Oral QHS  . pantoprazole  40 mg Oral Daily  . pravastatin  40 mg Oral QHS  . predniSONE  40 mg Oral Q  breakfast  . umeclidinium-vilanterol  1 puff Inhalation Daily   Continuous Infusions:    LOS: 3 days    Time spent: 37 minutes spent on chart review, discussion with nursing staff, consultants, updating family and interview/physical exam; more than 50% of that time was spent in counseling and/or coordination of care.    Tristin Gladman J [place] DO [facility] Available via Epic secure chat 7am-7pm After these hours, please refer to coverage provider listed on [email or web address] 09/16/2019, 1:29 PM

## 2019-09-16 NOTE — Care Management Important Message (Signed)
Important Message  Patient Details IM Letter given to [name] SW Case Manager to present to the Patient Name: [name] Molina MRN: [id number] Date of Birth: 04/13/1946   Medicare Important Message Given:  Yes     Kerin Salen 09/16/2019, 11:46 AM

## 2019-09-16 NOTE — Progress Notes (Signed)
Physical Therapy Treatment Patient Details Name: [name] Molina MRN: [id number] DOB: July 22, 1945 Today's Date: 09/16/2019    History of Present Illness 74 yo female admitted with acute respiratory failure, COPD exac. Hx of COPD, asthma, macular degeneration, neuropathy    PT Comments    Patient limited this date due to tachycardia. Pt resting in 110-120 at start of session and increased to 130's with talking and weight shifting in chair. HR decreased with cues for deep slow breathing and pt denied any chest pain. Patient performed 1x sit<>stand and HR noted to increase to 140's, pt instructed to return to sitting and max HR of 155 bpm reached once sitting in recliner. HR reduced to 120-130's and MD in room at EOS. All further mobility held due to tachycardia. Pt reported this AM she did feel pressure like gas in her chest and it comes and goes. RN notified of pressure/pain in chest and of HR. Will continue to follow and monitor HR closely during mobility.   Follow Up Recommendations  Home health PT;Supervision/Assistance - 24 hour     Equipment Recommendations  None recommended by PT    Recommendations for Other Services       Precautions / Restrictions Precautions Precautions: Fall Precaution Comments: monitor O2, HR Restrictions Weight Bearing Restrictions: No    Mobility  Bed Mobility        General bed mobility comments: pt OOB in recliner at start  Transfers Overall transfer level: Needs assistance Equipment used: Rolling walker (2 wheeled) Transfers: Sit to/from Stand Sit to Stand: Min guard         General transfer comment: pt able to safely sequence sit to stand with RW. min guard requried for safety but patient able to complete power up without assist. Pt took 1 step forward and backward and instructed to return to sit due to tachycardia.  Ambulation/Gait        Stairs        Wheelchair Mobility    Modified Rankin (Stroke Patients Only)        Balance Overall balance assessment: Needs assistance Sitting-balance support: No upper extremity supported;Feet supported Sitting balance-Leahy Scale: Good     Standing balance support: Bilateral upper extremity supported Standing balance-Leahy Scale: Poor     Cognition Arousal/Alertness: Awake/alert Behavior During Therapy: WFL for tasks assessed/performed Overall Cognitive Status: Within Functional Limits for tasks assessed         Exercises      General Comments General comments (skin integrity, edema, etc.): Pt's HR elevated at rest. in 110-120's. pt Hr reach 130's with sitting forward in chair but able to return to ~105 with cues for deep slow breathing. After standing pt's HR in 120-140's and reach max of 155 bpm after pt instructed to return to sitting. Pt noted to fluctuate into A-fib rhythm and HR decreased back to 120-130's.       Pertinent Vitals/Pain Pain Assessment: Faces Faces Pain Scale: Hurts a little bit Pain Location: chest Pain Descriptors / Indicators: Discomfort Pain Intervention(s): (RN notified)           PT Goals (current goals can now be found in the care plan section) Acute Rehab PT Goals Patient Stated Goal: home soon PT Goal Formulation: With patient Time For Goal Achievement: 09/27/19 Potential to Achieve Goals: Good Progress towards PT goals: Progressing toward goals    Frequency    Min 3X/week      PT Plan Current plan remains appropriate  AM-PAC PT "6 Clicks" Mobility   Outcome Measure  Help needed turning from your back to your side while in a flat bed without using bedrails?: A Little Help needed moving from lying on your back to sitting on the side of a flat bed without using bedrails?: A Little Help needed moving to and from a bed to a chair (including a wheelchair)?: A Little Help needed standing up from a chair using your arms (e.g., wheelchair or bedside chair)?: A Little Help needed to walk in hospital room?: A  Little Help needed climbing 3-5 steps with a railing? : A Lot 6 Click Score: 17    End of Session Equipment Utilized During Treatment: Gait belt Activity Tolerance: Treatment limited secondary to medical complications (Comment)(tachycardia) Patient left: in chair;with call bell/phone within reach;with chair alarm set Nurse Communication: Mobility status PT Visit Diagnosis: Difficulty in walking, not elsewhere classified (R26.2);History of falling (Z91.81);Unsteadiness on feet (R26.81);Muscle weakness (generalized) (M62.81)     Time: [id number] PT Time Calculation (min) (ACUTE ONLY): 18 min  Charges:  $Therapeutic Activity: 8-22 mins                     Verner Mould, DPT Physical Therapist with [facility] [phone]  09/16/2019 2:37 PM

## 2019-09-17 LAB — BASIC METABOLIC PANEL
Anion gap: 10 (ref 5–15)
BUN: 32 mg/dL — ABNORMAL HIGH (ref 8–23)
CO2: 26 mmol/L (ref 22–32)
Calcium: 8.5 mg/dL — ABNORMAL LOW (ref 8.9–10.3)
Chloride: 106 mmol/L (ref 98–111)
Creatinine, Ser: 0.82 mg/dL (ref 0.44–1.00)
GFR calc Af Amer: >60 mL/min (ref >60)
GFR calc non Af Amer: >60 mL/min (ref >60)
Glucose, Bld: 120 mg/dL — ABNORMAL HIGH (ref 70–99)
Potassium: 3.4 mmol/L — ABNORMAL LOW (ref 3.5–5.1)
Sodium: 142 mmol/L (ref 135–145)

## 2019-09-17 MED ORDER — POTASSIUM CHLORIDE CRYS ER 20 MEQ PO TBCR
30.0000 meq | EXTENDED_RELEASE_TABLET | ORAL | Status: AC
  Administered 2019-09-17 (×2): 30 meq via ORAL
  Filled 2019-09-17 (×2): qty 1

## 2019-09-17 MED ORDER — APIXABAN 5 MG PO TABS: 5.0000 mg | ORAL_TABLET | Freq: Two times a day (BID) | ORAL | Status: DC

## 2019-09-17 MED ORDER — APIXABAN 5 MG PO TABS
5.0000 mg | ORAL_TABLET | Freq: Two times a day (BID) | ORAL | Status: DC
  Administered 2019-09-17 – 2019-09-18 (×3): 5 mg via ORAL
  Filled 2019-09-17 (×3): qty 1

## 2019-09-17 MED ORDER — METOPROLOL SUCCINATE ER 100 MG PO TB24
100.0000 mg | ORAL_TABLET | Freq: Every day | ORAL | Status: DC
  Administered 2019-09-17 – 2019-09-18 (×2): 100 mg via ORAL
  Filled 2019-09-17 (×2): qty 1

## 2019-09-17 NOTE — Progress Notes (Signed)
PROGRESS NOTE    [name] Molina  [id number] DOB: 1946/01/28 DOA: 09/12/2019 PCP: Harlan Stains, MD    Brief Narrative:  KAMLA BARRIS is a 74 year old Caucasian female with past medical history remarkable for COPD, asthma, GERD, HLD, hypertension, atrial fibrillation, macular degeneration who presents to the ED with progressive shortness of breath, coughing and wheezing.  Patient noted to have SPO2 86% on room air and placed on 2 L nasal cannula.  Patient was treated with neb treatments and admitted for further evaluation and work-up.   Assessment & Plan:   Principal Problem:   Acute respiratory failure with hypoxia (HCC) Active Problems:   Hypertension   GERD (gastroesophageal reflux disease)   Hyperlipidemia   Obstructive sleep apnea   COPD exacerbation (HCC)   Acute respiratory failure (HCC)   Acute respiratory failure with hypoxia Acute COPD exacerbation Patient presenting with progressive shortness of breath, notably hypoxic on presentation with SPO2 86%; not on home oxygen.  Chest x-ray unrevealing, afebrile without leukocytosis. --Continue to taper off steroids, on prednisone 40 mg p.o. daily x 5 days; End 5/2 --Continue duo nebs/albuterol prn --Singulair 10 mg p.o. nightly --Anoro-Ellipta 62.5-25mcg 1 puff daily --Currently oxygenating well on room air  Chronic diastolic congestive heart failure BNP slightly elevated 335.9.  Was receiving IV fluids initially on admission.  On Lasix 20 mg p.o. daily at home.  2D echocardiogram 02/2018 with EF 60-65% with grade 2 diastolic dysfunction.  Chest x-ray on admission with no acute cardiopulmonary disease process.  Repeat chest x-ray this morning shows left base atelectasis, otherwise clear. --Transitioned IV Lasix to Lasix 40 mg p.o. daily --Strict I's and O's and daily weights --Monitor renal function closely   Essential hypertension Hx paroxysmal atrial tachycardia New onset atrial fibrillation with RVR Patient follows  with cardiology, Dr. Marlou Porch outpatient.  Patient continues with episodes of tachycardia that is symptomatic with any type of movement. Marland Kitchen --Following, appreciate assistance --Increased Cardizem CD to 360 mg p.o. daily today --Continue valsartan/HCTZ 160-12.5 mg 1/2 tablet p.o. daily --Started on metoprolol XL 100 mg p.o. daily --Started on Eliquis for anticoagulation --Continue aspirin and statin --Continue to monitor on telemetry  GERD: Continue Protonix 40 mg p.o. daily  HLD: Continue pravastatin 40 mg p.o. nightly  Depression: Continue Prozac 40 mg p.o. daily  Obesity Body mass index is 42.58 kg/m.  Discussed with patient needs for aggressive weight loss measures/lifestyle changes as this complicates all facets of care.   DVT prophylaxis: Lovenox Code Status: Full code Family Communication: Discussed with the patient at bedside extensively.  Disposition Plan:  Status is: Inpatient  Remains inpatient appropriate because:Ongoing diagnostic testing needed not appropriate for outpatient work up, Unsafe d/c plan, IV treatments appropriate due to intensity of illness or inability to take PO and Inpatient level of care appropriate due to severity of illness   Dispo: The patient is from: Home              Anticipated d/c is to: Home vs SNF              Anticipated d/c date is: 1 day              Patient currently is not medically stable to d/c.   Consultants:   Cardiology  Procedures:   None  Antimicrobials:   Cefepime 4/25 - 4/27  Doxycycline 4/28>>  Cefdinir 4/28>>  Anti-infectives (From admission, onward)   Start     Dose/Rate Route Frequency Ordered Stop   09/14/19  2200  doxycycline (VIBRA-TABS) tablet 100 mg     100 mg Oral Every 12 hours 09/14/19 1445 09/21/19 2159   09/14/19 2200  cefdinir (OMNICEF) capsule 300 mg     300 mg Oral Every 12 hours 09/14/19 1445 09/19/19 2159   Sep 18, 2019 2000  ceFEPIme (MAXIPIME) 2 g in sodium chloride 0.9 % 100 mL IVPB  Status:   Discontinued     2 g 200 mL/hr over 30 Minutes Intravenous Every 8 hours 09-18-2019 1924 09/14/19 1445      Subjective: Patient seen and examined at bedside, resting comfortably.  Feeling better this morning.  Shortness of breath has improved.  Heart rate better controlled now started on metoprolol XL.  Also started on Eliquis for new onset A. fib with RVR.  Cardiology plans outpatient follow-up with Zio patch.  Updated patient's daughter via telephone this afternoon. Patient continues off of supplemental oxygen.  No other complaints or concerns at this time. Denies headache, no fever/chills/night sweats, no chest pain, no abdominal pain.  No other acute events overnight per nursing staff.   Objective: Vitals:   09/17/19 0458 09/17/19 0817 09/17/19 0820 09/17/19 1300  BP: 127/64   113/66  Pulse: 72   88  Resp: 18   16  Temp: 97.9 F (36.6 C)   97.7 F (36.5 C)  TempSrc:    Oral  SpO2: 95% 95% 95% 94%  Weight: 105.6 kg     Height:        Intake/Output Summary (Last 24 hours) at 09/17/2019 1453 Last data filed at 09/17/2019 1400 Gross per 24 hour  Intake 740 ml  Output 1951 ml  Net -1211 ml   Filed Weights   09/15/19 0545 09/16/19 0538 09/17/19 0458  Weight: 109.6 kg 108.2 kg 105.6 kg    Examination:  General exam: Appears calm and comfortable  Respiratory system: Clear to auscultation. Respiratory effort normal.  On room air Cardiovascular system: S1 & S2 heard, RRR. No JVD, murmurs, rubs, gallops or clicks. No pedal edema. Gastrointestinal system: Abdomen is nondistended, soft and nontender. No organomegaly or masses felt. Normal bowel sounds heard. Central nervous system: Alert and oriented. No focal neurological deficits. Extremities: Symmetric 5 x 5 power. Skin: No rashes, lesions or ulcers Psychiatry: Judgement and insight appear normal. Mood & affect appropriate.     Data Reviewed: I have personally reviewed following labs and imaging studies  CBC: Recent Labs    Lab 09-18-19 1352 09/13/19 0427 09/14/19 0344 09/15/19 0319 09/16/19 0351  WBC 6.6 5.4 9.9 7.8 7.9  NEUTROABS 5.3  --   --   --   --   HGB 14.2 12.9 12.5 12.0 13.4  HCT 44.5 40.3 39.7 38.4 42.6  MCV 86.4 85.9 87.1 87.7 85.9  PLT 165 159 179 198 [id number]   Basic Metabolic Panel: Recent Labs  Lab 09/18/19 1352 09/14/19 0344 09/15/19 0319 09/16/19 0351 09/17/19 0401  NA 141 139 139 141 142  K 3.9 4.2 4.0 3.5 3.4*  CL 105 106 105 105 106  CO2 26 24 24 25 26   GLUCOSE 112* 178* 218* 132* 120*  BUN 10 24* 31* 33* 32*  CREATININE 0.67 0.78 0.85 0.82 0.82  CALCIUM 8.9 9.0 8.8* 8.7* 8.5*  MG  --   --   --  2.2  --    GFR: Estimated Creatinine Clearance: 67.7 mL/min (by C-G formula based on SCr of 0.82 mg/dL). Liver Function Tests: No results for input(s): AST, ALT, ALKPHOS, BILITOT, PROT, ALBUMIN in  the last 168 hours. No results for input(s): LIPASE, AMYLASE in the last 168 hours. No results for input(s): AMMONIA in the last 168 hours. Coagulation Profile: No results for input(s): INR, PROTIME in the last 168 hours. Cardiac Enzymes: No results for input(s): CKTOTAL, CKMB, CKMBINDEX, TROPONINI in the last 168 hours. BNP (last 3 results) No results for input(s): PROBNP in the last 8760 hours. HbA1C: No results for input(s): HGBA1C in the last 72 hours. CBG: No results for input(s): GLUCAP in the last 168 hours. Lipid Profile: No results for input(s): CHOL, HDL, LDLCALC, TRIG, CHOLHDL, LDLDIRECT in the last 72 hours. Thyroid Function Tests: Recent Labs    09/16/19 1348  TSH 1.381   Anemia Panel: No results for input(s): VITAMINB12, FOLATE, FERRITIN, TIBC, IRON, RETICCTPCT in the last 72 hours. Sepsis Labs: No results for input(s): PROCALCITON, LATICACIDVEN in the last 168 hours.  Recent Results (from the past 240 hour(s))  Respiratory Panel by RT PCR (Flu A&B, Covid) - Nasopharyngeal Swab     Status: None   Collection Time: 09/12/19  2:15 PM   Specimen: Nasopharyngeal  Swab  Result Value Ref Range Status   SARS Coronavirus 2 by RT PCR NEGATIVE NEGATIVE Final    Comment: (NOTE) SARS-CoV-2 target nucleic acids are NOT DETECTED. The SARS-CoV-2 RNA is generally detectable in upper respiratoy specimens during the acute phase of infection. The lowest concentration of SARS-CoV-2 viral copies this assay can detect is 131 copies/mL. A negative result does not preclude SARS-Cov-2 infection and should not be used as the sole basis for treatment or other patient management decisions. A negative result may occur with  improper specimen collection/handling, submission of specimen other than nasopharyngeal swab, presence of viral mutation(s) within the areas targeted by this assay, and inadequate number of viral copies (<131 copies/mL). A negative result must be combined with clinical observations, patient history, and epidemiological information. The expected result is Negative. Fact Sheet for Patients:  PinkCheek.be Fact Sheet for Healthcare Providers:  GravelBags.it This test is not yet ap proved or cleared by the [place] FDA and  has been authorized for detection and/or diagnosis of SARS-CoV-2 by FDA under an Emergency Use Authorization (EUA). This EUA will remain  in effect (meaning this test can be used) for the duration of the COVID-19 declaration under Section 564(b)(1) of the Act, 21 U.S.C. section 360bbb-3(b)(1), unless the authorization is terminated or revoked sooner.    Influenza A by PCR NEGATIVE NEGATIVE Final   Influenza B by PCR NEGATIVE NEGATIVE Final    Comment: (NOTE) The Xpert Xpress SARS-CoV-2/FLU/RSV assay is intended as an aid in  the diagnosis of influenza from Nasopharyngeal swab specimens and  should not be used as a sole basis for treatment. Nasal washings and  aspirates are unacceptable for Xpert Xpress SARS-CoV-2/FLU/RSV  testing. Fact Sheet for  Patients: PinkCheek.be Fact Sheet for Healthcare Providers: GravelBags.it This test is not yet approved or cleared by the [place] FDA and  has been authorized for detection and/or diagnosis of SARS-CoV-2 by  FDA under an Emergency Use Authorization (EUA). This EUA will remain  in effect (meaning this test can be used) for the duration of the  Covid-19 declaration under Section 564(b)(1) of the Act, 21  U.S.C. section 360bbb-3(b)(1), unless the authorization is  terminated or revoked. Performed at [facility], [place] [address], [place], [place] [address]          Radiology Studies: ECHOCARDIOGRAM COMPLETE  Result Date: 09/16/2019  ECHOCARDIOGRAM REPORT   Patient Name:   AIRYN COTES Date of Exam: 09/16/2019 Medical Rec #:  [id number]     Height:       62.0 in Accession #:    [id number]    Weight:       238.5 lb Date of Birth:  03/30/1946      BSA:          2.060 m Patient Age:    26 years      BP:           126/61 mmHg Patient Gender: F             HR:           113 bpm. Exam Location:  Inpatient Procedure: 2D Echo, Cardiac Doppler and Color Doppler Indications:    I48.91* Unspeicified atrial fibrillation  History:        Patient has prior history of Echocardiogram examinations, most                 recent 02/24/2018. Abnormal ECG, COPD, Signs/Symptoms:Dyspnea;                 Risk Factors:Hypertension, Dyslipidemia, Former Smoker and Sleep                 Apnea. Edema.  Sonographer:    Roseanna Rainbow RDCS Referring Phys: [id number] [place] [facility]  Sonographer Comments: Patient is morbidly obese and Technically difficult study due to poor echo windows. Image acquisition challenging due to patient body habitus. IMPRESSIONS  1. Left ventricular ejection fraction, by estimation, is 65 to 70%. The left ventricle has normal function. The left ventricle has no regional wall motion abnormalities. Left ventricular diastolic  parameters are indeterminate.  2. Right ventricular systolic function is normal. The right ventricular size is normal. There is normal pulmonary artery systolic pressure.  3. Left atrial size was mildly dilated.  4. The mitral valve is normal in structure. Trivial mitral valve regurgitation. No evidence of mitral stenosis.  5. The aortic valve is normal in structure. Aortic valve regurgitation is not visualized. No aortic stenosis is present.  6. The inferior vena cava is dilated in size with <50% respiratory variability, suggesting right atrial pressure of 15 mmHg. FINDINGS  Left Ventricle: Left ventricular ejection fraction, by estimation, is 65 to 70%. The left ventricle has normal function. The left ventricle has no regional wall motion abnormalities. The left ventricular internal cavity size was normal in size. There is  no left ventricular hypertrophy. Left ventricular diastolic parameters are indeterminate. Right Ventricle: The right ventricular size is normal. No increase in right ventricular wall thickness. Right ventricular systolic function is normal. There is normal pulmonary artery systolic pressure. The tricuspid regurgitant velocity is 1.28 m/s, and  with an assumed right atrial pressure of 15 mmHg, the estimated right ventricular systolic pressure is 0000000 mmHg. Left Atrium: Left atrial size was mildly dilated. Right Atrium: Right atrial size was normal in size. Pericardium: There is no evidence of pericardial effusion. Mitral Valve: Mobile 1.19 cm x 0.64 cm density on the ventricular side of the mitral valve. Most like mitral annular calcification given that it is very hyperechoic. Endocarditis is much less likely. The mitral valve is normal in structure. Normal mobility of the mitral valve leaflets. Severe mitral annular calcification. Trivial mitral valve regurgitation. No evidence of mitral valve stenosis. MV peak gradient, 12.0 mmHg. The mean mitral valve gradient is 3.7 mmHg. Tricuspid Valve:  The tricuspid valve is  normal in structure. Tricuspid valve regurgitation is trivial. No evidence of tricuspid stenosis. Aortic Valve: The aortic valve is normal in structure. Aortic valve regurgitation is not visualized. No aortic stenosis is present. Pulmonic Valve: The pulmonic valve was normal in structure. Pulmonic valve regurgitation is not visualized. No evidence of pulmonic stenosis. Aorta: The aortic root is normal in size and structure. Venous: The inferior vena cava is dilated in size with less than 50% respiratory variability, suggesting right atrial pressure of 15 mmHg. IAS/Shunts: No atrial level shunt detected by color flow Doppler.  LEFT VENTRICLE PLAX 2D LVIDd:         4.51 cm LVIDs:         2.90 cm LV PW:         1.62 cm LV IVS:        1.47 cm LVOT diam:     1.70 cm LV SV:         34 LV SV Index:   17 LVOT Area:     2.27 cm  LV Volumes (MOD) LV vol d, MOD A2C: 19.8 ml LV vol d, MOD A4C: 49.2 ml LV vol s, MOD A2C: 6.3 ml LV vol s, MOD A4C: 15.3 ml LV SV MOD A2C:     13.5 ml LV SV MOD A4C:     49.2 ml LV SV MOD BP:      21.8 ml RIGHT VENTRICLE         IVC TAPSE (M-mode): 0.4 cm  IVC diam: 2.60 cm LEFT ATRIUM             Index       RIGHT ATRIUM           Index LA diam:        3.60 cm 1.75 cm/m  RA Area:     13.30 cm LA Vol (A2C):   62.3 ml 30.24 ml/m RA Volume:   28.80 ml  13.98 ml/m LA Vol (A4C):   48.6 ml 23.59 ml/m LA Biplane Vol: 59.3 ml 28.78 ml/m  AORTIC VALVE LVOT Vmax:   100.00 cm/s LVOT Vmean:  67.400 cm/s LVOT VTI:    0.150 m  AORTA Ao Root diam: 3.10 cm MITRAL VALVE                TRICUSPID VALVE MV Area (PHT): 4.06 cm     TR Peak grad:   6.6 mmHg MV Peak grad:  12.0 mmHg    TR Vmax:        128.00 cm/s MV Mean grad:  3.7 mmHg MV Vmax:       1.73 m/s     SHUNTS MV Vmean:      81.0 cm/s    Systemic VTI:  0.15 m MV Decel Time: 187 msec     Systemic Diam: 1.70 cm MV E velocity: 167.00 cm/s Skeet Latch MD Electronically signed by Skeet Latch MD Signature Date/Time:  09/16/2019/6:21:42 PM    Final         Scheduled Meds: . apixaban  5 mg Oral BID  . aspirin EC  81 mg Oral Daily  . cefdinir  300 mg Oral Q12H  . diltiazem  360 mg Oral Daily  . doxycycline  100 mg Oral Q12H  . feeding supplement (ENSURE ENLIVE)  237 mL Oral BID BM  . FLUoxetine  40 mg Oral q morning - 10a  . fluticasone  1 puff Inhalation BID  . furosemide  40 mg Oral Daily  . guaiFENesin  600 mg Oral BID  . irbesartan  75 mg Oral Daily  . loratadine  5 mg Oral Daily  . metoprolol succinate  100 mg Oral Daily  . montelukast  10 mg Oral QHS  . pantoprazole  40 mg Oral Daily  . pravastatin  40 mg Oral QHS  . predniSONE  40 mg Oral Q breakfast  . umeclidinium-vilanterol  1 puff Inhalation Daily   Continuous Infusions:    LOS: 4 days    Time spent: 34 minutes spent on chart review, discussion with nursing staff, consultants, updating family and interview/physical exam; more than 50% of that time was spent in counseling and/or coordination of care.    Jacqlyn Marolf J [place] DO [facility] Available via Epic secure chat 7am-7pm After these hours, please refer to coverage provider listed on [email or web address] 09/17/2019, 2:53 PM

## 2019-09-17 NOTE — Progress Notes (Signed)
Physical Therapy Treatment Patient Details Name: [name] Molina MRN: [id number] DOB: [birth year] Today's Date: 09/17/2019    History of Present Illness 74 yo female admitted with acute respiratory failure, COPD exac. Hx of COPD, asthma, macular degeneration, neuropathy    PT Comments    Patient with marked improvement in activity tolerance and HR more stable today. She was able to ambulate ~77' with 3 seated rest breaks throughout due to weakness and fatigue. Pt's HR elevated to max of 111 bpm and distance ambulated w/rest breaks took ~6 minutes total. Pt will continue to benefit from skilled PT interventions to progress functional mobility as able. Recommend HHPT for follow up therapy.   Follow Up Recommendations  Home health PT;Supervision/Assistance - 24 hour     Equipment Recommendations  None recommended by PT    Recommendations for Other Services       Precautions / Restrictions Precautions Precautions: Fall Precaution Comments: monitor O2, HR Restrictions Weight Bearing Restrictions: No    Mobility  Bed Mobility Overal bed mobility: Needs Assistance Bed Mobility: Supine to Sit     Supine to sit: Supervision;HOB elevated     General bed mobility comments: pt requries increased time, no assist needed  Transfers Overall transfer level: Needs assistance Equipment used: 4-wheeled walker(personal rollator) Transfers: Sit to/from Stand Sit to Stand: Min guard         General transfer comment: min guard for safety, pt performed power up without assist and steady with rising. HR increased from 80's-90's to low 100's with transfer and back down to 90's.  Ambulation/Gait Ambulation/Gait assistance: Min assist;Min guard Gait Distance (Feet): 77 Feet(35, 27, 15) Assistive device: 4-wheeled walker Gait Pattern/deviations: Decreased step length - right;Decreased step length - left;Decreased stride length;Step-through pattern Gait velocity: decreased   General Gait  Details: pt able to progress ambulation today with rollator and HR remained under 120. Cues intermittently for safe proximity to rollator and assist to steady. pt required 3 seated rest breaks during ambulation and took ~6 minutes to complete all gait. distance and rest breaks. pt reported 2/4 on dyspnea scale and denied chest pain.   Stairs        Wheelchair Mobility    Modified Rankin (Stroke Patients Only)       Balance Overall balance assessment: Needs assistance Sitting-balance support: No upper extremity supported;Feet supported Sitting balance-Leahy Scale: Good     Standing balance support: Bilateral upper extremity supported Standing balance-Leahy Scale: Poor         Cognition Arousal/Alertness: Awake/alert Behavior During Therapy: WFL for tasks assessed/performed Overall Cognitive Status: Within Functional Limits for tasks assessed      Exercises      General Comments General comments (skin integrity, edema, etc.): HR resting in 80-90's at rest. Max HR of 111bpm reached during gait.       Pertinent Vitals/Pain Pain Assessment: No/denies pain           PT Goals (current goals can now be found in the care plan section) Acute Rehab PT Goals Patient Stated Goal: home soon PT Goal Formulation: With patient Time For Goal Achievement: 09/27/19 Potential to Achieve Goals: Good Progress towards PT goals: Progressing toward goals    Frequency    Min 3X/week      PT Plan Current plan remains appropriate       AM-PAC PT "6 Clicks" Mobility   Outcome Measure  Help needed turning from your back to your side while in a flat bed without using bedrails?: None  Help needed moving from lying on your back to sitting on the side of a flat bed without using bedrails?: None Help needed moving to and from a bed to a chair (including a wheelchair)?: A Little Help needed standing up from a chair using your arms (e.g., wheelchair or bedside chair)?: A Little Help  needed to walk in hospital room?: A Little Help needed climbing 3-5 steps with a railing? : A Lot 6 Click Score: 19    End of Session Equipment Utilized During Treatment: Gait belt Activity Tolerance: Patient tolerated treatment well Patient left: in chair;with call bell/phone within reach;with chair alarm set;with family/visitor present Nurse Communication: Mobility status PT Visit Diagnosis: Difficulty in walking, not elsewhere classified (R26.2);History of falling (Z91.81);Unsteadiness on feet (R26.81);Muscle weakness (generalized) (M62.81)     Time: [id number] PT Time Calculation (min) (ACUTE ONLY): 24 min  Charges:  $Gait Training: 23-37 mins                     Verner Mould, DPT Physical Therapist with [facility] [phone]  09/17/2019 1:47 PM

## 2019-09-17 NOTE — Progress Notes (Signed)
Progress Note  Patient Name: [name] Molina Date of Encounter: 09/17/2019  Primary Cardiologist:   Candee Furbish, MD   Subjective   She thinks that she is breathing better.  No pain.  Waiting to ambulate  Inpatient Medications    Scheduled Meds:  aspirin EC  81 mg Oral Daily   cefdinir  300 mg Oral Q12H   diltiazem  360 mg Oral Daily   doxycycline  100 mg Oral Q12H   enoxaparin (LOVENOX) injection  0.5 mg/kg Subcutaneous QHS   feeding supplement (ENSURE ENLIVE)  237 mL Oral BID BM   FLUoxetine  40 mg Oral q morning - 10a   fluticasone  1 puff Inhalation BID   furosemide  40 mg Oral Daily   guaiFENesin  600 mg Oral BID   irbesartan  75 mg Oral Daily   loratadine  5 mg Oral Daily   metoprolol tartrate  25 mg Oral Q6H   montelukast  10 mg Oral QHS   pantoprazole  40 mg Oral Daily   potassium chloride  30 mEq Oral Q3H   pravastatin  40 mg Oral QHS   predniSONE  40 mg Oral Q breakfast   umeclidinium-vilanterol  1 puff Inhalation Daily   Continuous Infusions:  PRN Meds: acetaminophen, aspirin-acetaminophen-caffeine, clobetasol cream, ibuprofen, lactase, levalbuterol, LORazepam, ondansetron **OR** ondansetron (ZOFRAN) IV, phenol, tiZANidine   Vital Signs    Vitals:   09/16/19 2021 09/17/19 0458 09/17/19 0817 09/17/19 0820  BP:  127/64    Pulse:  72    Resp:  18    Temp:  97.9 F (36.6 C)    TempSrc:      SpO2: 95% 95% 95% 95%  Weight:  105.6 kg    Height:        Intake/Output Summary (Last 24 hours) at 09/17/2019 1049 Last data filed at 09/17/2019 1036 Gross per 24 hour  Intake 500 ml  Output 1951 ml  Net -1451 ml   Filed Weights   09/15/19 0545 09/16/19 0538 09/17/19 0458  Weight: 109.6 kg 108.2 kg 105.6 kg    Telemetry    Atrial fib with controlled ventricular rate - Personally Reviewed  ECG    NA - Personally Reviewed  Physical Exam   GEN: No acute distress.   Neck: No  JVD Cardiac: Irregular RR, no murmurs, rubs, or gallops.   Respiratory:    Few basilar crackles GI: Soft, nontender, non-distended  MS:     Trace edema; No deformity. Neuro:  Nonfocal  Psych: Normal affect   Labs    Chemistry Recent Labs  Lab 09/15/19 0319 09/16/19 0351 09/17/19 0401  NA 139 141 142  K 4.0 3.5 3.4*  CL 105 105 106  CO2 24 25 26   GLUCOSE 218* 132* 120*  BUN 31* 33* 32*  CREATININE 0.85 0.82 0.82  CALCIUM 8.8* 8.7* 8.5*  GFRNONAA >60 >60 >60  GFRAA >60 >60 >60  ANIONGAP 10 11 10      Hematology Recent Labs  Lab 09/14/19 0344 09/15/19 0319 09/16/19 0351  WBC 9.9 7.8 7.9  RBC 4.56 4.38 4.96  HGB 12.5 12.0 13.4  HCT 39.7 38.4 42.6  MCV 87.1 87.7 85.9  MCH 27.4 27.4 27.0  MCHC 31.5 31.3 31.5  RDW 15.3 15.2 15.1  PLT 179 198 214    Cardiac EnzymesNo results for input(s): TROPONINI in the last 168 hours. No results for input(s): TROPIPOC in the last 168 hours.   BNP Recent Labs  Lab 09/13/19 0427  BNP 335.9*     DDimer No results for input(s): DDIMER in the last 168 hours.   Radiology    ECHOCARDIOGRAM COMPLETE  Result Date: 09/16/2019    ECHOCARDIOGRAM REPORT   Patient Name:   [name] Molina Date of Exam: 09/16/2019 Medical Rec #:  [id number]     Height:       62.0 in Accession #:    [id number]    Weight:       238.5 lb Date of Birth:  05-19-1946      BSA:          2.060 m Patient Age:    74 years      BP:           126/61 mmHg Patient Gender: F             HR:           113 bpm. Exam Location:  Inpatient Procedure: 2D Echo, Cardiac Doppler and Color Doppler Indications:    I48.91* Unspeicified atrial fibrillation  History:        Patient has prior history of Echocardiogram examinations, most                 recent 02/24/2018. Abnormal ECG, COPD, Signs/Symptoms:Dyspnea;                 Risk Factors:Hypertension, Dyslipidemia, Former Smoker and Sleep                 Apnea. Edema.  Sonographer:    Roseanna Rainbow RDCS Referring Phys: [id number] [place] [facility]  Sonographer Comments: Patient is morbidly obese and  Technically difficult study due to poor echo windows. Image acquisition challenging due to patient body habitus. IMPRESSIONS  1. Left ventricular ejection fraction, by estimation, is 65 to 70%. The left ventricle has normal function. The left ventricle has no regional wall motion abnormalities. Left ventricular diastolic parameters are indeterminate.  2. Right ventricular systolic function is normal. The right ventricular size is normal. There is normal pulmonary artery systolic pressure.  3. Left atrial size was mildly dilated.  4. The mitral valve is normal in structure. Trivial mitral valve regurgitation. No evidence of mitral stenosis.  5. The aortic valve is normal in structure. Aortic valve regurgitation is not visualized. No aortic stenosis is present.  6. The inferior vena cava is dilated in size with <50% respiratory variability, suggesting right atrial pressure of 15 mmHg. FINDINGS  Left Ventricle: Left ventricular ejection fraction, by estimation, is 65 to 70%. The left ventricle has normal function. The left ventricle has no regional wall motion abnormalities. The left ventricular internal cavity size was normal in size. There is  no left ventricular hypertrophy. Left ventricular diastolic parameters are indeterminate. Right Ventricle: The right ventricular size is normal. No increase in right ventricular wall thickness. Right ventricular systolic function is normal. There is normal pulmonary artery systolic pressure. The tricuspid regurgitant velocity is 1.28 m/s, and  with an assumed right atrial pressure of 15 mmHg, the estimated right ventricular systolic pressure is 0000000 mmHg. Left Atrium: Left atrial size was mildly dilated. Right Atrium: Right atrial size was normal in size. Pericardium: There is no evidence of pericardial effusion. Mitral Valve: Mobile 1.19 cm x 0.64 cm density on the ventricular side of the mitral valve. Most like mitral annular calcification given that it is very hyperechoic.  Endocarditis is much less likely. The mitral valve is normal in structure. Normal mobility of the mitral valve leaflets. Severe  mitral annular calcification. Trivial mitral valve regurgitation. No evidence of mitral valve stenosis. MV peak gradient, 12.0 mmHg. The mean mitral valve gradient is 3.7 mmHg. Tricuspid Valve: The tricuspid valve is normal in structure. Tricuspid valve regurgitation is trivial. No evidence of tricuspid stenosis. Aortic Valve: The aortic valve is normal in structure. Aortic valve regurgitation is not visualized. No aortic stenosis is present. Pulmonic Valve: The pulmonic valve was normal in structure. Pulmonic valve regurgitation is not visualized. No evidence of pulmonic stenosis. Aorta: The aortic root is normal in size and structure. Venous: The inferior vena cava is dilated in size with less than 50% respiratory variability, suggesting right atrial pressure of 15 mmHg. IAS/Shunts: No atrial level shunt detected by color flow Doppler.  LEFT VENTRICLE PLAX 2D LVIDd:         4.51 cm LVIDs:         2.90 cm LV PW:         1.62 cm LV IVS:        1.47 cm LVOT diam:     1.70 cm LV SV:         34 LV SV Index:   17 LVOT Area:     2.27 cm  LV Volumes (MOD) LV vol d, MOD A2C: 19.8 ml LV vol d, MOD A4C: 49.2 ml LV vol s, MOD A2C: 6.3 ml LV vol s, MOD A4C: 15.3 ml LV SV MOD A2C:     13.5 ml LV SV MOD A4C:     49.2 ml LV SV MOD BP:      21.8 ml RIGHT VENTRICLE         IVC TAPSE (M-mode): 0.4 cm  IVC diam: 2.60 cm LEFT ATRIUM             Index       RIGHT ATRIUM           Index LA diam:        3.60 cm 1.75 cm/m  RA Area:     13.30 cm LA Vol (A2C):   62.3 ml 30.24 ml/m RA Volume:   28.80 ml  13.98 ml/m LA Vol (A4C):   48.6 ml 23.59 ml/m LA Biplane Vol: 59.3 ml 28.78 ml/m  AORTIC VALVE LVOT Vmax:   100.00 cm/s LVOT Vmean:  67.400 cm/s LVOT VTI:    0.150 m  AORTA Ao Root diam: 3.10 cm MITRAL VALVE                TRICUSPID VALVE MV Area (PHT): 4.06 cm     TR Peak grad:   6.6 mmHg MV Peak grad:   12.0 mmHg    TR Vmax:        128.00 cm/s MV Mean grad:  3.7 mmHg MV Vmax:       1.73 m/s     SHUNTS MV Vmean:      81.0 cm/s    Systemic VTI:  0.15 m MV Decel Time: 187 msec     Systemic Diam: 1.70 cm MV E velocity: 167.00 cm/s Skeet Latch MD Electronically signed by Skeet Latch MD Signature Date/Time: 09/16/2019/6:21:42 PM    Final     Cardiac Studies   ECHO:    1. Left ventricular ejection fraction, by estimation, is 65 to 70%. The  left ventricle has normal function. The left ventricle has no regional  wall motion abnormalities. Left ventricular diastolic parameters are  indeterminate.  2. Right ventricular systolic function is normal. The right ventricular  size  is normal. There is normal pulmonary artery systolic pressure.  3. Left atrial size was mildly dilated.  4. The mitral valve is normal in structure. Trivial mitral valve  regurgitation. No evidence of mitral stenosis.  5. The aortic valve is normal in structure. Aortic valve regurgitation is  not visualized. No aortic stenosis is present.  6. The inferior vena cava is dilated in size with <50% respiratory  variability, suggesting right atrial pressure of 15 mmHg.   Patient Profile     74 y.o. female with a hx of chronic diastolic heart failure, paroxysmal atrial tachycardia, moderate mitral annular calcification, COPD, morbid obesity, HTN, and HLD who is being seen for the evaluation of Afib RVR - new onset at the request of Dr. [place]  Assessment & Plan    ATRIAL FIB:   Eliquis started.    I will arrange follow up in our office within two weeks and she can have a Zio ordered after that.  Consolidate to Toprol XL 100 mg daily.   For questions or updates, please contact [place] Please consult [email or web address] for contact info under Cardiology/STEMI.   Signed, Minus Breeding, MD  09/17/2019, 10:49 AM

## 2019-09-18 DIAGNOSIS — I4891 Unspecified atrial fibrillation: Secondary | ICD-10-CM | POA: Diagnosis present

## 2019-09-18 LAB — BASIC METABOLIC PANEL
Anion gap: 8 (ref 5–15)
BUN: 28 mg/dL — ABNORMAL HIGH (ref 8–23)
CO2: 25 mmol/L (ref 22–32)
Calcium: 8.5 mg/dL — ABNORMAL LOW (ref 8.9–10.3)
Chloride: 105 mmol/L (ref 98–111)
Creatinine, Ser: 0.72 mg/dL (ref 0.44–1.00)
GFR calc Af Amer: >60 mL/min (ref >60)
GFR calc non Af Amer: >60 mL/min (ref >60)
Glucose, Bld: 125 mg/dL — ABNORMAL HIGH (ref 70–99)
Potassium: 3.9 mmol/L (ref 3.5–5.1)
Sodium: 138 mmol/L (ref 135–145)

## 2019-09-18 LAB — MAGNESIUM: Magnesium: 2 mg/dL (ref 1.7–2.4)

## 2019-09-18 MED ORDER — DILTIAZEM HCL ER COATED BEADS 360 MG PO CP24: 360.0000 mg | ORAL_CAPSULE | Freq: Every day | ORAL | 0 refills | Status: DC

## 2019-09-18 MED ORDER — METOPROLOL SUCCINATE ER 100 MG PO TB24: 100.0000 mg | ORAL_TABLET | Freq: Every day | ORAL | 0 refills | Status: DC

## 2019-09-18 MED ORDER — APIXABAN 5 MG PO TABS: 5.0000 mg | ORAL_TABLET | Freq: Two times a day (BID) | ORAL | 0 refills | Status: DC

## 2019-09-18 MED ORDER — CEFDINIR 300 MG PO CAPS: 300.0000 mg | ORAL_CAPSULE | Freq: Two times a day (BID) | ORAL | 0 refills | Status: AC

## 2019-09-18 MED ORDER — DOXYCYCLINE HYCLATE 100 MG PO TABS: 100.0000 mg | ORAL_TABLET | Freq: Two times a day (BID) | ORAL | 0 refills | Status: AC

## 2019-09-18 MED ORDER — PREDNISONE 20 MG PO TABS: 40.0000 mg | ORAL_TABLET | Freq: Every day | ORAL | 0 refills | Status: AC

## 2019-09-18 MED ORDER — FUROSEMIDE 40 MG PO TABS: 40.0000 mg | ORAL_TABLET | Freq: Every day | ORAL | 0 refills | Status: DC

## 2019-09-18 NOTE — Discharge Summary (Signed)
Physician Discharge Summary  BLAYKE DAM [id number] DOB: [birth year] DOA: 09/12/2019  PCP: Harlan Stains, MD  Admit date: 09/12/2019 Discharge date: 09/18/2019  Admitted From: Home Disposition: Home  Recommendations for Outpatient Follow-up:  1. Follow up with PCP in 1-2 weeks 2. Follow-up with cardiology 2 weeks, Dr. Marlou Porch 3. Please obtain BMP/CBC in one week 4. Please follow up on the following pending results:  Home Health: PT/RN Equipment/Devices: None  Discharge Condition: Stable CODE STATUS: Full code Diet recommendation: Cardiac diet  History of present illness:  [name] Molina is a 74 year old Caucasian female with past medical history remarkable for COPD, asthma, GERD, HLD, hypertension, atrial fibrillation, macular degeneration who presents to the ED with progressive shortness of breath, coughing and wheezing.  Patient noted to have SPO2 86% on room air and placed on 2 L nasal cannula.  Patient was treated with neb treatments and admitted for further evaluation and work-up.  Hospital course:  Acute respiratory failure with hypoxia Acute COPD exacerbation Patient presenting with progressive shortness of breath, notably hypoxic on presentation with SPO2 86%; not on home oxygen.  Chest x-ray unrevealing, afebrile without leukocytosis.  Patient was started on IV steroids and duo nebs/albuterol.  She is continued on Singulair and her inhaler with hospital substitution.  Patient's oxygen status improved as well as her shortness of breath and she was slowly weaned off of IV steroids to prednisone.  Patient will resume her home inhalers and Singulair and continue prednisone to complete 1 additional day following discharge.  Patient was oxygenating well on room air at time of discharge.  Chronic diastolic congestive heart failure BNP slightly elevated 335.9.  Was receiving IV fluids initially on admission.  On Lasix 20 mg p.o. daily at home.  2D echocardiogram 02/2018 with EF  60-65% with grade 2 diastolic dysfunction.  Chest x-ray on admission with no acute cardiopulmonary disease process.  Patient was started on IV diuresis with good urine output and transition to Lasix 40 mg p.o. daily at time of discharge.  Essential hypertension Hx paroxysmal atrial tachycardia New onset atrial fibrillation with RVR Patient follows with cardiology, Dr. Marlou Porch outpatient.  Patient continues with episodes of tachycardia that is symptomatic with any type of movement.  Cardiology was consulted and followed during hospital course.  Patient's Cardizem was increased to 360 mg p.o. daily.  She was also started on metoprolol XL 100 mg p.o. daily with better heart rate control with ambulation.  She was also started on Eliquis for anticoagulation.  Patient will resume her home valsartan/HCTZ.  We will continue aspirin and statin.  Will need follow-up with cardiology 2 weeks following hospitalization with Dr. Marlou Porch.  GERD: Continue Protonix 40 mg p.o. daily  HLD: Continue pravastatin 40 mg p.o. nightly  Depression: Continue Prozac 40 mg p.o. daily  Obesity Body mass index is 42.58 kg/m.  Discussed with patient needs for aggressive weight loss measures/lifestyle changes as this complicates all facets of care.  Discharge Diagnoses:  Active Problems:   Hypertension   GERD (gastroesophageal reflux disease)   Hyperlipidemia   Obstructive sleep apnea   Atrial fibrillation with RVR [facility])    Discharge Instructions  Discharge Instructions    (HEART FAILURE PATIENTS) Call MD:  Anytime you have any of the following symptoms: 1) 3 pound weight gain in 24 hours or 5 pounds in 1 week 2) shortness of breath, with or without a dry hacking cough 3) swelling in the hands, feet or stomach 4) if you have to sleep on  extra pillows at night in order to breathe.   Complete by: As directed    Call MD for:  difficulty breathing, headache or visual disturbances   Complete by: As directed    Call MD  for:  extreme fatigue   Complete by: As directed    Call MD for:  persistant dizziness or light-headedness   Complete by: As directed    Call MD for:  persistant nausea and vomiting   Complete by: As directed    Call MD for:  severe uncontrolled pain   Complete by: As directed    Call MD for:  temperature >100.4   Complete by: As directed    Diet - low sodium heart healthy   Complete by: As directed    Increase activity slowly   Complete by: As directed      Allergies as of 09/18/2019      Reactions   Food Shortness Of Breath   ALLERGY= MUSHROOMS   Tequin Shortness Of Breath   Codeine Nausea And Vomiting   Erythromycin Other (See Comments)   GI UPSET   Levaquin [levofloxacin In D5w] Other (See Comments)   "made me want to die."   Wellbutrin [bupropion Hcl] Other (See Comments)   SHAKING   Penicillins Rash   Prednisone Rash      Medication List    STOP taking these medications   HYDROcodone-acetaminophen 5-325 MG tablet Commonly known as: NORCO/VICODIN     TAKE these medications   acetaminophen 500 MG tablet Commonly known as: TYLENOL Take 1,000 mg by mouth every 6 (six) hours as needed for moderate pain or headache.   albuterol 108 (90 Base) MCG/ACT inhaler Commonly known as: VENTOLIN HFA Inhale 2 puffs into the lungs every 6 (six) hours as needed for wheezing or shortness of breath.   apixaban 5 MG Tabs tablet Commonly known as: ELIQUIS Take 1 tablet (5 mg total) by mouth 2 (two) times daily.   aspirin 81 MG tablet Take 81 mg by mouth daily.   aspirin-acetaminophen-caffeine 250-250-65 MG tablet Commonly known as: EXCEDRIN MIGRAINE Take 2 tablets by mouth every 6 (six) hours as needed for headache.   benzonatate 200 MG capsule Commonly known as: TESSALON Take 1 capsule (200 mg total) by mouth 3 (three) times daily as needed for cough.   Besivance 0.6 % Susp Generic drug: Besifloxacin HCl Place 1 drop into the left eye See admin instructions. Instill 1  drop qid day of eye injection and qid the day after.   cefdinir 300 MG capsule Commonly known as: OMNICEF Take 1 capsule (300 mg total) by mouth every 12 (twelve) hours for 3 days.   clobetasol cream 0.05 % Commonly known as: TEMOVATE Apply 1 application topically daily as needed (yeast.).   diltiazem 360 MG 24 hr capsule Commonly known as: CARDIZEM CD Take 1 capsule (360 mg total) by mouth daily. What changed:   medication strength  how much to take   doxycycline 100 MG tablet Commonly known as: VIBRA-TABS Take 1 tablet (100 mg total) by mouth every 12 (twelve) hours for 3 days.   FLUoxetine 40 MG capsule Commonly known as: PROZAC Take 40 mg by mouth every morning.   fluticasone 50 MCG/ACT nasal spray Commonly known as: FLONASE Place 1 spray into both nostrils daily.   Fluticasone-Umeclidin-Vilant 100-62.5-25 MCG/INH Aepb Commonly known as: Trelegy Ellipta Take 1 puff by mouth daily.   furosemide 40 MG tablet Commonly known as: LASIX Take 1 tablet (40 mg total) by  mouth daily. What changed:   medication strength  how much to take  when to take this  reasons to take this  additional instructions   ibuprofen 200 MG tablet Commonly known as: ADVIL Take 400 mg by mouth every 6 (six) hours as needed for headache or moderate pain.   lactase 3000 units tablet Commonly known as: LACTAID Take 3,000 Units by mouth 3 (three) times daily as needed (lactose ingestion).   loratadine-pseudoephedrine 5-120 MG tablet Commonly known as: CLARITIN-D 12-hour Take 1 tablet by mouth daily.   LORazepam 0.5 MG tablet Commonly known as: ATIVAN Take 0.5-1 mg by mouth daily as needed for anxiety. Anxiety   metoprolol succinate 100 MG 24 hr tablet Commonly known as: TOPROL-XL Take 1 tablet (100 mg total) by mouth daily. Take with or immediately following a meal.   montelukast 10 MG tablet Commonly known as: SINGULAIR Take 10 mg by mouth daily as needed.   OCUVITE  PO Take 1 tablet by mouth 2 (two) times daily.   omeprazole 20 MG capsule Commonly known as: PRILOSEC Take 20 mg by mouth at bedtime.   pravastatin 40 MG tablet Commonly known as: PRAVACHOL Take 40 mg by mouth at bedtime.   predniSONE 20 MG tablet Commonly known as: DELTASONE Take 2 tablets (40 mg total) by mouth daily with breakfast for 1 day. Start taking on: Sep 19, 2019   tizanidine 2 MG capsule Commonly known as: ZANAFLEX Take 2 mg by mouth at bedtime as needed for muscle spasms (head pain per pt).   valsartan-hydrochlorothiazide 160-12.5 MG tablet Commonly known as: DIOVAN-HCT Take 0.5 tablets by mouth daily.      Follow-up Information    Harlan Stains, MD. Schedule an appointment as soon as possible for a visit in 1 week(s).   Specialty: Family Medicine Contact information: [address] [place] [address] [phone]        Jerline Pain, MD. Schedule an appointment as soon as possible for a visit in 2 week(s).   Specialty: Cardiology Contact information: [id number] [place] [address] [phone]          Allergies  Allergen Reactions  . Food Shortness Of Breath    ALLERGY= MUSHROOMS  . Tequin Shortness Of Breath  . Codeine Nausea And Vomiting  . Erythromycin Other (See Comments)    GI UPSET  . Levaquin [Levofloxacin In D5w] Other (See Comments)    "made me want to die."  . Wellbutrin [Bupropion Hcl] Other (See Comments)    SHAKING  . Penicillins Rash  . Prednisone Rash    Consultations:  Cardiology - Dr. Percival Spanish   Procedures/Studies: DG CHEST PORT 1 VIEW  Result Date: 09/15/2019 CLINICAL DATA:  Shortness of breath EXAM: PORTABLE CHEST 1 VIEW COMPARISON:  September 12, 2019 FINDINGS: There is atelectatic change in the left base. Lungs elsewhere are clear. Heart is borderline enlarged with pulmonary vascularity normal. No adenopathy. There is aortic atherosclerosis. No bone lesions. IMPRESSION:  Left base atelectasis. Lungs elsewhere clear. Heart borderline enlarged. No adenopathy. Aortic Atherosclerosis (ICD10-I70.0). Electronically Signed   By: Lowella Grip III M.D.   On: 09/15/2019 09:04   DG Chest Port 1 View  Result Date: 09/12/2019 CLINICAL DATA:  Cough, chills, body aches since Friday EXAM: PORTABLE CHEST 1 VIEW COMPARISON:  12/19/2015 FINDINGS: Single frontal view of the chest demonstrates a stable cardiac silhouette. Mild atherosclerosis of the aortic arch. No airspace disease, effusion, or pneumothorax. No acute bony  abnormalities. IMPRESSION: 1. No acute intrathoracic process. Electronically Signed   By: Randa Ngo M.D.   On: 09/12/2019 15:02   ECHOCARDIOGRAM COMPLETE  Result Date: 09/16/2019    ECHOCARDIOGRAM REPORT   Patient Name:   [name] Molina Date of Exam: 09/16/2019 Medical Rec #:  [id number]     Height:       62.0 in Accession #:    [id number]    Weight:       238.5 lb Date of Birth:  Jun 08, 1945      BSA:          2.060 m Patient Age:    49 years      BP:           126/61 mmHg Patient Gender: F             HR:           113 bpm. Exam Location:  Inpatient Procedure: 2D Echo, Cardiac Doppler and Color Doppler Indications:    I48.91* Unspeicified atrial fibrillation  History:        Patient has prior history of Echocardiogram examinations, most                 recent 02/24/2018. Abnormal ECG, COPD, Signs/Symptoms:Dyspnea;                 Risk Factors:Hypertension, Dyslipidemia, Former Smoker and Sleep                 Apnea. Edema.  Sonographer:    Roseanna Rainbow RDCS Referring Phys: [id number] [place] [facility]  Sonographer Comments: Patient is morbidly obese and Technically difficult study due to poor echo windows. Image acquisition challenging due to patient body habitus. IMPRESSIONS  1. Left ventricular ejection fraction, by estimation, is 65 to 70%. The left ventricle has normal function. The left ventricle has no regional wall motion abnormalities. Left ventricular diastolic  parameters are indeterminate.  2. Right ventricular systolic function is normal. The right ventricular size is normal. There is normal pulmonary artery systolic pressure.  3. Left atrial size was mildly dilated.  4. The mitral valve is normal in structure. Trivial mitral valve regurgitation. No evidence of mitral stenosis.  5. The aortic valve is normal in structure. Aortic valve regurgitation is not visualized. No aortic stenosis is present.  6. The inferior vena cava is dilated in size with <50% respiratory variability, suggesting right atrial pressure of 15 mmHg. FINDINGS  Left Ventricle: Left ventricular ejection fraction, by estimation, is 65 to 70%. The left ventricle has normal function. The left ventricle has no regional wall motion abnormalities. The left ventricular internal cavity size was normal in size. There is  no left ventricular hypertrophy. Left ventricular diastolic parameters are indeterminate. Right Ventricle: The right ventricular size is normal. No increase in right ventricular wall thickness. Right ventricular systolic function is normal. There is normal pulmonary artery systolic pressure. The tricuspid regurgitant velocity is 1.28 m/s, and  with an assumed right atrial pressure of 15 mmHg, the estimated right ventricular systolic pressure is 0000000 mmHg. Left Atrium: Left atrial size was mildly dilated. Right Atrium: Right atrial size was normal in size. Pericardium: There is no evidence of pericardial effusion. Mitral Valve: Mobile 1.19 cm x 0.64 cm density on the ventricular side of the mitral valve. Most like mitral annular calcification given that it is very hyperechoic. Endocarditis is much less likely. The mitral valve is normal in structure. Normal mobility of the mitral valve leaflets. Severe mitral  annular calcification. Trivial mitral valve regurgitation. No evidence of mitral valve stenosis. MV peak gradient, 12.0 mmHg. The mean mitral valve gradient is 3.7 mmHg. Tricuspid Valve:  The tricuspid valve is normal in structure. Tricuspid valve regurgitation is trivial. No evidence of tricuspid stenosis. Aortic Valve: The aortic valve is normal in structure. Aortic valve regurgitation is not visualized. No aortic stenosis is present. Pulmonic Valve: The pulmonic valve was normal in structure. Pulmonic valve regurgitation is not visualized. No evidence of pulmonic stenosis. Aorta: The aortic root is normal in size and structure. Venous: The inferior vena cava is dilated in size with less than 50% respiratory variability, suggesting right atrial pressure of 15 mmHg. IAS/Shunts: No atrial level shunt detected by color flow Doppler.  LEFT VENTRICLE PLAX 2D LVIDd:         4.51 cm LVIDs:         2.90 cm LV PW:         1.62 cm LV IVS:        1.47 cm LVOT diam:     1.70 cm LV SV:         34 LV SV Index:   17 LVOT Area:     2.27 cm  LV Volumes (MOD) LV vol d, MOD A2C: 19.8 ml LV vol d, MOD A4C: 49.2 ml LV vol s, MOD A2C: 6.3 ml LV vol s, MOD A4C: 15.3 ml LV SV MOD A2C:     13.5 ml LV SV MOD A4C:     49.2 ml LV SV MOD BP:      21.8 ml RIGHT VENTRICLE         IVC TAPSE (M-mode): 0.4 cm  IVC diam: 2.60 cm LEFT ATRIUM             Index       RIGHT ATRIUM           Index LA diam:        3.60 cm 1.75 cm/m  RA Area:     13.30 cm LA Vol (A2C):   62.3 ml 30.24 ml/m RA Volume:   28.80 ml  13.98 ml/m LA Vol (A4C):   48.6 ml 23.59 ml/m LA Biplane Vol: 59.3 ml 28.78 ml/m  AORTIC VALVE LVOT Vmax:   100.00 cm/s LVOT Vmean:  67.400 cm/s LVOT VTI:    0.150 m  AORTA Ao Root diam: 3.10 cm MITRAL VALVE                TRICUSPID VALVE MV Area (PHT): 4.06 cm     TR Peak grad:   6.6 mmHg MV Peak grad:  12.0 mmHg    TR Vmax:        128.00 cm/s MV Mean grad:  3.7 mmHg MV Vmax:       1.73 m/s     SHUNTS MV Vmean:      81.0 cm/s    Systemic VTI:  0.15 m MV Decel Time: 187 msec     Systemic Diam: 1.70 cm MV E velocity: 167.00 cm/s Skeet Latch MD Electronically signed by Skeet Latch MD Signature Date/Time:  09/16/2019/6:21:42 PM    Final       Subjective: Patient seen and examined bedside, resting comfortably.  Ready for discharge home.  States her heart rate has been better controlled with ambulation since addition of metoprolol.  No other complaints or concerns at this time.  Denies headache, no fever/chills/night sweats, nausea/vomiting/diarrhea, no chest pain, no shortness of breath, no weakness, no  fatigue, no paresthesias.  No acute events overnight per nursing staff.  Discharge Exam: Vitals:   09/18/19 0821 09/18/19 0823  BP:    Pulse:    Resp:    Temp:    SpO2: 97% 97%   Vitals:   09/17/19 2059 09/18/19 0620 09/18/19 0821 09/18/19 0823  BP: 129/66     Pulse: 76 93    Resp: 19     Temp: 97.7 F (36.5 C)     TempSrc: Oral     SpO2: 94% 96% 97% 97%  Weight:  108.8 kg    Height:        General: Pt is alert, awake, not in acute distress Cardiovascular: Irregularly irregular rhythm, normal rate, S1/S2 +, no rubs, no gallops Respiratory: CTA bilaterally, no wheezing, no rhonchi, oxygenating well on room air Abdominal: Soft, NT, ND, bowel sounds + Extremities: no edema, no cyanosis    The results of significant diagnostics from this hospitalization (including imaging, microbiology, ancillary and laboratory) are listed below for reference.     Microbiology: Recent Results (from the past 240 hour(s))  Respiratory Panel by RT PCR (Flu A&B, Covid) - Nasopharyngeal Swab     Status: None   Collection Time: 09/12/19  2:15 PM   Specimen: Nasopharyngeal Swab  Result Value Ref Range Status   SARS Coronavirus 2 by RT PCR NEGATIVE NEGATIVE Final    Comment: (NOTE) SARS-CoV-2 target nucleic acids are NOT DETECTED. The SARS-CoV-2 RNA is generally detectable in upper respiratoy specimens during the acute phase of infection. The lowest concentration of SARS-CoV-2 viral copies this assay can detect is 131 copies/mL. A negative result does not preclude SARS-Cov-2 infection and should  not be used as the sole basis for treatment or other patient management decisions. A negative result may occur with  improper specimen collection/handling, submission of specimen other than nasopharyngeal swab, presence of viral mutation(s) within the areas targeted by this assay, and inadequate number of viral copies (<131 copies/mL). A negative result must be combined with clinical observations, patient history, and epidemiological information. The expected result is Negative. Fact Sheet for Patients:  PinkCheek.be Fact Sheet for Healthcare Providers:  GravelBags.it This test is not yet ap proved or cleared by the [place] FDA and  has been authorized for detection and/or diagnosis of SARS-CoV-2 by FDA under an Emergency Use Authorization (EUA). This EUA will remain  in effect (meaning this test can be used) for the duration of the COVID-19 declaration under Section 564(b)(1) of the Act, 21 U.S.C. section 360bbb-3(b)(1), unless the authorization is terminated or revoked sooner.    Influenza A by PCR NEGATIVE NEGATIVE Final   Influenza B by PCR NEGATIVE NEGATIVE Final    Comment: (NOTE) The Xpert Xpress SARS-CoV-2/FLU/RSV assay is intended as an aid in  the diagnosis of influenza from Nasopharyngeal swab specimens and  should not be used as a sole basis for treatment. Nasal washings and  aspirates are unacceptable for Xpert Xpress SARS-CoV-2/FLU/RSV  testing. Fact Sheet for Patients: PinkCheek.be Fact Sheet for Healthcare Providers: GravelBags.it This test is not yet approved or cleared by the [place] FDA and  has been authorized for detection and/or diagnosis of SARS-CoV-2 by  FDA under an Emergency Use Authorization (EUA). This EUA will remain  in effect (meaning this test can be used) for the duration of the  Covid-19 declaration under Section 564(b)(1)  of the Act, 21  U.S.C. section 360bbb-3(b)(1), unless the authorization is  terminated or revoked. Performed at [facility], [place] [address] [place], [place] [address]      Labs: BNP (last 3 results) Recent Labs    09/13/19 0427  BNP [id number]*   Basic Metabolic Panel: Recent Labs  Lab 09/14/19 0344 09/15/19 0319 09/16/19 0351 09/17/19 0401 09/18/19 0501  NA 139 139 141 142 138  K 4.2 4.0 3.5 3.4* 3.9  CL 106 105 105 106 105  CO2 24 24 25 26 25   GLUCOSE 178* 218* 132* 120* 125*  BUN 24* 31* 33* 32* 28*  CREATININE 0.78 0.85 0.82 0.82 0.72  CALCIUM 9.0 8.8* 8.7* 8.5* 8.5*  MG  --   --  2.2  --  2.0   Liver Function Tests: No results for input(s): AST, ALT, ALKPHOS, BILITOT, PROT, ALBUMIN in the last 168 hours. No results for input(s): LIPASE, AMYLASE in the last 168 hours. No results for input(s): AMMONIA in the last 168 hours. CBC: Recent Labs  Lab 09/12/19 1352 09/13/19 0427 09/14/19 0344 09/15/19 0319 09/16/19 0351  WBC 6.6 5.4 9.9 7.8 7.9  NEUTROABS 5.3  --   --   --   --   HGB 14.2 12.9 12.5 12.0 13.4  HCT 44.5 40.3 39.7 38.4 42.6  MCV 86.4 85.9 87.1 87.7 85.9  PLT 165 159 179 198 214   Cardiac Enzymes: No results for input(s): CKTOTAL, CKMB, CKMBINDEX, TROPONINI in the last 168 hours. BNP: Invalid input(s): POCBNP CBG: No results for input(s): GLUCAP in the last 168 hours. D-Dimer No results for input(s): DDIMER in the last 72 hours. Hgb A1c No results for input(s): HGBA1C in the last 72 hours. Lipid Profile No results for input(s): CHOL, HDL, LDLCALC, TRIG, CHOLHDL, LDLDIRECT in the last 72 hours. Thyroid function studies Recent Labs    09/16/19 1348  TSH 1.381   Anemia work up No results for input(s): VITAMINB12, FOLATE, FERRITIN, TIBC, IRON, RETICCTPCT in the last 72 hours. Urinalysis No results found for: COLORURINE, APPEARANCEUR, [place], [place], [place], [place], [place], [place], PROTEINUR, UROBILINOGEN,  NITRITE, LEUKOCYTESUR Sepsis Labs Invalid input(s): PROCALCITONIN,  WBC,  LACTICIDVEN Microbiology Recent Results (from the past 240 hour(s))  Respiratory Panel by RT PCR (Flu A&B, Covid) - Nasopharyngeal Swab     Status: None   Collection Time: 09/12/19  2:15 PM   Specimen: Nasopharyngeal Swab  Result Value Ref Range Status   SARS Coronavirus 2 by RT PCR NEGATIVE NEGATIVE Final    Comment: (NOTE) SARS-CoV-2 target nucleic acids are NOT DETECTED. The SARS-CoV-2 RNA is generally detectable in upper respiratoy specimens during the acute phase of infection. The lowest concentration of SARS-CoV-2 viral copies this assay can detect is 131 copies/mL. A negative result does not preclude SARS-Cov-2 infection and should not be used as the sole basis for treatment or other patient management decisions. A negative result may occur with  improper specimen collection/handling, submission of specimen other than nasopharyngeal swab, presence of viral mutation(s) within the areas targeted by this assay, and inadequate number of viral copies (<131 copies/mL). A negative result must be combined with clinical observations, patient history, and epidemiological information. The expected result is Negative. Fact Sheet for Patients:  PinkCheek.be Fact Sheet for Healthcare Providers:  GravelBags.it This test is not yet ap proved or cleared by the [place] FDA and  has been authorized for detection and/or diagnosis of SARS-CoV-2 by FDA under an Emergency Use Authorization (EUA). This EUA will remain  in effect (meaning this test can be used) for the duration of the COVID-19 declaration under Section  564(b)(1) of the Act, 21 U.S.C. section 360bbb-3(b)(1), unless the authorization is terminated or revoked sooner.    Influenza A by PCR NEGATIVE NEGATIVE Final   Influenza B by PCR NEGATIVE NEGATIVE Final    Comment: (NOTE) The Xpert Xpress  SARS-CoV-2/FLU/RSV assay is intended as an aid in  the diagnosis of influenza from Nasopharyngeal swab specimens and  should not be used as a sole basis for treatment. Nasal washings and  aspirates are unacceptable for Xpert Xpress SARS-CoV-2/FLU/RSV  testing. Fact Sheet for Patients: PinkCheek.be Fact Sheet for Healthcare Providers: GravelBags.it This test is not yet approved or cleared by the [place] FDA and  has been authorized for detection and/or diagnosis of SARS-CoV-2 by  FDA under an Emergency Use Authorization (EUA). This EUA will remain  in effect (meaning this test can be used) for the duration of the  Covid-19 declaration under Section 564(b)(1) of the Act, 21  U.S.C. section 360bbb-3(b)(1), unless the authorization is  terminated or revoked. Performed at [facility], [place] [address] [place], [place] [address]      Time coordinating discharge: Over 30 minutes  SIGNED:   Rondale Nies J [place] DO  [facility] 09/18/2019, 9:14 AM

## 2019-09-18 NOTE — Progress Notes (Signed)
Progress Note  Patient Name: [name] Molina Date of Encounter: 09/18/2019  Primary Cardiologist:   Candee Furbish, MD   Subjective   She feels OK and is ready to go home.  No pain.   Inpatient Medications    Scheduled Meds: . apixaban  5 mg Oral BID  . aspirin EC  81 mg Oral Daily  . cefdinir  300 mg Oral Q12H  . diltiazem  360 mg Oral Daily  . doxycycline  100 mg Oral Q12H  . feeding supplement (ENSURE ENLIVE)  237 mL Oral BID BM  . FLUoxetine  40 mg Oral q morning - 10a  . fluticasone  1 puff Inhalation BID  . furosemide  40 mg Oral Daily  . guaiFENesin  600 mg Oral BID  . irbesartan  75 mg Oral Daily  . loratadine  5 mg Oral Daily  . metoprolol succinate  100 mg Oral Daily  . montelukast  10 mg Oral QHS  . pantoprazole  40 mg Oral Daily  . pravastatin  40 mg Oral QHS  . predniSONE  40 mg Oral Q breakfast  . umeclidinium-vilanterol  1 puff Inhalation Daily   Continuous Infusions:  PRN Meds: acetaminophen, aspirin-acetaminophen-caffeine, clobetasol cream, ibuprofen, lactase, levalbuterol, LORazepam, ondansetron **OR** ondansetron (ZOFRAN) IV, phenol, tiZANidine   Vital Signs    Vitals:   09/17/19 1300 09/17/19 2036 09/17/19 2059 09/18/19 0620  BP: 113/66  129/66   Pulse: 88  76 93  Resp: 16  19   Temp: 97.7 F (36.5 C)  97.7 F (36.5 C)   TempSrc: Oral  Oral   SpO2: 94% 95% 94% 96%  Weight:    108.8 kg  Height:        Intake/Output Summary (Last 24 hours) at 09/18/2019 0815 Last data filed at 09/17/2019 2200 Gross per 24 hour  Intake 360 ml  Output 1551 ml  Net -1191 ml   Filed Weights   09/16/19 0538 09/17/19 0458 09/18/19 0620  Weight: 108.2 kg 105.6 kg 108.8 kg    Telemetry    Atrial fib with controlled rate - Personally Reviewed  ECG    NA - Personally Reviewed  Physical Exam   GENERAL:  Well appearing NECK:  No jugular venous distention, waveform within normal limits, carotid upstroke brisk and symmetric, no bruits, no thyromegaly LUNGS:   Clear to auscultation bilaterally CHEST:  Unremarkable HEART:  PMI not displaced or sustained,S1 and S2 within normal limits, no S3, no S4, no clicks, no rubs, no murmurs ABD:  Flat, positive bowel sounds normal in frequency in pitch, no bruits, no rebound, no guarding, no midline pulsatile mass, no hepatomegaly, no splenomegaly EXT:  2 plus pulses throughout, no edema, no cyanosis no clubbing  Labs    Chemistry Recent Labs  Lab 09/16/19 0351 09/17/19 0401 09/18/19 0501  NA 141 142 138  K 3.5 3.4* 3.9  CL 105 106 105  CO2 25 26 25   GLUCOSE 132* 120* 125*  BUN 33* 32* 28*  CREATININE 0.82 0.82 0.72  CALCIUM 8.7* 8.5* 8.5*  GFRNONAA >60 >60 >60  GFRAA >60 >60 >60  ANIONGAP 11 10 8      Hematology Recent Labs  Lab 09/14/19 0344 09/15/19 0319 09/16/19 0351  WBC 9.9 7.8 7.9  RBC 4.56 4.38 4.96  HGB 12.5 12.0 13.4  HCT 39.7 38.4 42.6  MCV 87.1 87.7 85.9  MCH 27.4 27.4 27.0  MCHC 31.5 31.3 31.5  RDW 15.3 15.2 15.1  PLT 179 198 214  Cardiac EnzymesNo results for input(s): TROPONINI in the last 168 hours. No results for input(s): TROPIPOC in the last 168 hours.   BNP Recent Labs  Lab 09/13/19 0427  BNP 335.9*     DDimer No results for input(s): DDIMER in the last 168 hours.   Radiology    ECHOCARDIOGRAM COMPLETE  Result Date: 09/16/2019    ECHOCARDIOGRAM REPORT   Patient Name:   LAVINE ASANTE Date of Exam: 09/16/2019 Medical Rec #:  [id number]     Height:       62.0 in Accession #:    [id number]    Weight:       238.5 lb Date of Birth:  26-Oct-1945      BSA:          2.060 m Patient Age:    74 years      BP:           126/61 mmHg Patient Gender: F             HR:           113 bpm. Exam Location:  Inpatient Procedure: 2D Echo, Cardiac Doppler and Color Doppler Indications:    I48.91* Unspeicified atrial fibrillation  History:        Patient has prior history of Echocardiogram examinations, most                 recent 02/24/2018. Abnormal ECG, COPD,  Signs/Symptoms:Dyspnea;                 Risk Factors:Hypertension, Dyslipidemia, Former Smoker and Sleep                 Apnea. Edema.  Sonographer:    Roseanna Rainbow RDCS Referring Phys: [id number] [place] [facility]  Sonographer Comments: Patient is morbidly obese and Technically difficult study due to poor echo windows. Image acquisition challenging due to patient body habitus. IMPRESSIONS  1. Left ventricular ejection fraction, by estimation, is 65 to 70%. The left ventricle has normal function. The left ventricle has no regional wall motion abnormalities. Left ventricular diastolic parameters are indeterminate.  2. Right ventricular systolic function is normal. The right ventricular size is normal. There is normal pulmonary artery systolic pressure.  3. Left atrial size was mildly dilated.  4. The mitral valve is normal in structure. Trivial mitral valve regurgitation. No evidence of mitral stenosis.  5. The aortic valve is normal in structure. Aortic valve regurgitation is not visualized. No aortic stenosis is present.  6. The inferior vena cava is dilated in size with <50% respiratory variability, suggesting right atrial pressure of 15 mmHg. FINDINGS  Left Ventricle: Left ventricular ejection fraction, by estimation, is 65 to 70%. The left ventricle has normal function. The left ventricle has no regional wall motion abnormalities. The left ventricular internal cavity size was normal in size. There is  no left ventricular hypertrophy. Left ventricular diastolic parameters are indeterminate. Right Ventricle: The right ventricular size is normal. No increase in right ventricular wall thickness. Right ventricular systolic function is normal. There is normal pulmonary artery systolic pressure. The tricuspid regurgitant velocity is 1.28 m/s, and  with an assumed right atrial pressure of 15 mmHg, the estimated right ventricular systolic pressure is 0000000 mmHg. Left Atrium: Left atrial size was mildly dilated. Right Atrium:  Right atrial size was normal in size. Pericardium: There is no evidence of pericardial effusion. Mitral Valve: Mobile 1.19 cm x 0.64 cm density on the ventricular side of the mitral valve.  Most like mitral annular calcification given that it is very hyperechoic. Endocarditis is much less likely. The mitral valve is normal in structure. Normal mobility of the mitral valve leaflets. Severe mitral annular calcification. Trivial mitral valve regurgitation. No evidence of mitral valve stenosis. MV peak gradient, 12.0 mmHg. The mean mitral valve gradient is 3.7 mmHg. Tricuspid Valve: The tricuspid valve is normal in structure. Tricuspid valve regurgitation is trivial. No evidence of tricuspid stenosis. Aortic Valve: The aortic valve is normal in structure. Aortic valve regurgitation is not visualized. No aortic stenosis is present. Pulmonic Valve: The pulmonic valve was normal in structure. Pulmonic valve regurgitation is not visualized. No evidence of pulmonic stenosis. Aorta: The aortic root is normal in size and structure. Venous: The inferior vena cava is dilated in size with less than 50% respiratory variability, suggesting right atrial pressure of 15 mmHg. IAS/Shunts: No atrial level shunt detected by color flow Doppler.  LEFT VENTRICLE PLAX 2D LVIDd:         4.51 cm LVIDs:         2.90 cm LV PW:         1.62 cm LV IVS:        1.47 cm LVOT diam:     1.70 cm LV SV:         34 LV SV Index:   17 LVOT Area:     2.27 cm  LV Volumes (MOD) LV vol d, MOD A2C: 19.8 ml LV vol d, MOD A4C: 49.2 ml LV vol s, MOD A2C: 6.3 ml LV vol s, MOD A4C: 15.3 ml LV SV MOD A2C:     13.5 ml LV SV MOD A4C:     49.2 ml LV SV MOD BP:      21.8 ml RIGHT VENTRICLE         IVC TAPSE (M-mode): 0.4 cm  IVC diam: 2.60 cm LEFT ATRIUM             Index       RIGHT ATRIUM           Index LA diam:        3.60 cm 1.75 cm/m  RA Area:     13.30 cm LA Vol (A2C):   62.3 ml 30.24 ml/m RA Volume:   28.80 ml  13.98 ml/m LA Vol (A4C):   48.6 ml 23.59 ml/m LA  Biplane Vol: 59.3 ml 28.78 ml/m  AORTIC VALVE LVOT Vmax:   100.00 cm/s LVOT Vmean:  67.400 cm/s LVOT VTI:    0.150 m  AORTA Ao Root diam: 3.10 cm MITRAL VALVE                TRICUSPID VALVE MV Area (PHT): 4.06 cm     TR Peak grad:   6.6 mmHg MV Peak grad:  12.0 mmHg    TR Vmax:        128.00 cm/s MV Mean grad:  3.7 mmHg MV Vmax:       1.73 m/s     SHUNTS MV Vmean:      81.0 cm/s    Systemic VTI:  0.15 m MV Decel Time: 187 msec     Systemic Diam: 1.70 cm MV E velocity: 167.00 cm/s Skeet Latch MD Electronically signed by Skeet Latch MD Signature Date/Time: 09/16/2019/6:21:42 PM    Final     Cardiac Studies   ECHO:    1. Left ventricular ejection fraction, by estimation, is 65 to 70%. The  left ventricle has normal  function. The left ventricle has no regional  wall motion abnormalities. Left ventricular diastolic parameters are  indeterminate.  2. Right ventricular systolic function is normal. The right ventricular  size is normal. There is normal pulmonary artery systolic pressure.  3. Left atrial size was mildly dilated.  4. The mitral valve is normal in structure. Trivial mitral valve  regurgitation. No evidence of mitral stenosis.  5. The aortic valve is normal in structure. Aortic valve regurgitation is  not visualized. No aortic stenosis is present.  6. The inferior vena cava is dilated in size with <50% respiratory  variability, suggesting right atrial pressure of 15 mmHg.   Patient Profile     74 y.o. female with a hx of chronic diastolic heart failure, paroxysmal atrial tachycardia, moderate mitral annular calcification, COPD, morbid obesity, HTN, and HLD who is being seen for the evaluation of Afib RVR - new onset at the request of Dr. [place]  Assessment & Plan    ATRIAL FIB:   Eliquis started.  Rate is controlled with Cardizem consolidated to CD 360 mg daily.  I sent a message to Dr. Marlou Porch to set this patient up with a two week office followup.  The patient needs  to be given written instructions on the AVS to buy herself an Alive Cor.      For questions or updates, please contact [facility] Please consult [email or web address] for contact info under Cardiology/STEMI.   Signed, Minus Breeding, MD  09/18/2019, 8:15 AM

## 2019-09-18 NOTE — Discharge Instructions (Signed)
Chronic Obstructive Pulmonary Disease  Chronic obstructive pulmonary disease (COPD) is a long-term (chronic) condition that affects the lungs. COPD is a general term that can be used to describe many different lung problems that cause lung swelling (inflammation) and limit airflow, including chronic bronchitis and emphysema. If you have COPD, your lung function will probably never return to normal. In most cases, it gets worse over time. However, there are steps you can take to slow the progression of the disease and improve your quality of life. What are the causes? This condition may be caused by:  Smoking. This is the most common cause.  Certain genes passed down through families. What increases the risk? The following factors may make you more likely to develop this condition:  Secondhand smoke from cigarettes, pipes, or cigars.  Exposure to chemicals and other irritants such as fumes and dust in the work environment.  Chronic lung conditions or infections. What are the signs or symptoms? Symptoms of this condition include:  Shortness of breath, especially during physical activity.  Chronic cough with a large amount of thick mucus. Sometimes the cough may not have any mucus (dry cough).  Wheezing.  Rapid breaths.  Gray or bluish discoloration (cyanosis) of the skin, especially in your fingers, toes, or lips.  Feeling tired (fatigue).  Weight loss.  Chest tightness.  Frequent infections.  Episodes when breathing symptoms become much worse (exacerbations).  Swelling in the ankles, feet, or legs. This may occur in later stages of the disease. How is this diagnosed? This condition is diagnosed based on:  Your medical history.  A physical exam. You may also have tests, including:  Lung (pulmonary) function tests. This may include a spirometry test, which measures your ability to exhale properly.  Chest X-ray.  CT scan.  Blood tests. How is this  treated? This condition may be treated with:  Medicines. These may include inhaled rescue medicines to treat acute exacerbations as well as long-term, or maintenance, medicines to prevent flare-ups of COPD. ? Bronchodilators help treat COPD by dilating the airways to allow increased airflow and make your breathing more comfortable. ? Steroids can reduce airway inflammation and help prevent exacerbations.  Smoking cessation. If you smoke, your health care provider may ask you to quit, and may also recommend therapy or replacement products to help you quit.  Pulmonary rehabilitation. This may involve working with a team of health care providers and specialists, such as respiratory, occupational, and physical therapists.  Exercise and physical activity. These are beneficial for nearly all people with COPD.  Nutrition therapy to gain weight, if you are underweight.  Oxygen. Supplemental oxygen therapy is only helpful if you have a low oxygen level in your blood (hypoxemia).  Lung surgery or transplant.  Palliative care. This is to help people with COPD feel comfortable when treatment is no longer working. Follow these instructions at home: Medicines  Take over-the-counter and prescription medicines (inhaled or pills) only as told by your health care provider.  Talk to your health care provider before taking any cough or allergy medicines. You may need to avoid certain medicines that dry out your airways. Lifestyle  If you are a smoker, the most important thing that you can do is to stop smoking. Do not use any products that contain nicotine or tobacco, such as cigarettes and e-cigarettes. If you need help quitting, ask your health care provider. Continuing to smoke will cause the disease to progress faster.  Avoid exposure to things that irritate  your lungs, such as smoke, chemicals, and fumes.  Stay active, but balance activity with periods of rest. Exercise and physical activity will  help you maintain your ability to do things you want to do.  Learn and use relaxation techniques to manage stress and to control your breathing.  Get the right amount of sleep and get quality sleep. Most adults need 7 or more hours per night.  Eat healthy foods. Eating smaller, more frequent meals and resting before meals may help you maintain your strength. Controlled breathing Learn and use controlled breathing techniques as directed by your health care provider. Controlled breathing techniques include:  Pursed lip breathing. Start by breathing in (inhaling) through your nose for 1 second. Then, purse your lips as if you were going to whistle and breathe out (exhale) through the pursed lips for 2 seconds.  Diaphragmatic breathing. Start by putting one hand on your abdomen just above your waist. Inhale slowly through your nose. The hand on your abdomen should move out. Then purse your lips and exhale slowly. You should be able to feel the hand on your abdomen moving in as you exhale. Controlled coughing Learn and use controlled coughing to clear mucus from your lungs. Controlled coughing is a series of short, progressive coughs. The steps of controlled coughing are: 1. Lean your head slightly forward. 2. Breathe in deeply using diaphragmatic breathing. 3. Try to hold your breath for 3 seconds. 4. Keep your mouth slightly open while coughing twice. 5. Spit any mucus out into a tissue. 6. Rest and repeat the steps once or twice as needed. General instructions  Make sure you receive all the vaccines that your health care provider recommends, especially the pneumococcal and influenza vaccines. Preventing infection and hospitalization is very important when you have COPD.  Use oxygen therapy and pulmonary rehabilitation if directed to by your health care provider. If you require home oxygen therapy, ask your health care provider whether you should purchase a pulse oximeter to measure your oxygen  level at home.  Work with your health care provider to develop a COPD action plan. This will help you know what steps to take if your condition gets worse.  Keep other chronic health conditions under control as told by your health care provider.  Avoid extreme temperature and humidity changes.  Avoid contact with people who have an illness that spreads from person to person (is contagious), such as viral infections or pneumonia.  Keep all follow-up visits as told by your health care provider. This is important. Contact a health care provider if:  You are coughing up more mucus than usual.  There is a change in the color or thickness of your mucus.  Your breathing is more labored than usual.  Your breathing is faster than usual.  You have difficulty sleeping.  You need to use your rescue medicines or inhalers more often than expected.  You have trouble doing routine activities such as getting dressed or walking around the house. Get help right away if:  You have shortness of breath while you are resting.  You have shortness of breath that prevents you from: ? Being able to talk. ? Performing your usual physical activities.  You have chest pain lasting longer than 5 minutes.  Your skin color is more blue (cyanotic) than usual.  You measure low oxygen saturations for longer than 5 minutes with a pulse oximeter.  You have a fever.  You feel too tired to breathe normally. Summary  Chronic  obstructive pulmonary disease (COPD) is a long-term (chronic) condition that affects the lungs.  Your lung function will probably never return to normal. In most cases, it gets worse over time. However, there are steps you can take to slow the progression of the disease and improve your quality of life.  Treatment for COPD may include taking medicines, quitting smoking, pulmonary rehabilitation, and changes to diet and exercise. As the disease progresses, you may need oxygen therapy, a  lung transplant, or palliative care.  To help manage your condition, do not smoke, avoid exposure to things that irritate your lungs, stay up to date on all vaccines, and follow your health care provider's instructions for taking medicines. This information is not intended to replace advice given to you by your health care provider. Make sure you discuss any questions you have with your health care provider. Document Revised: 04/18/2017 Document Reviewed: 06/10/2016 Elsevier Patient Education  2020 [place]. Preventing Atrial Fibrillation-Related Stroke  Atrial fibrillation is a common type of irregular or rapid heartbeat (arrhythmia) that greatly increases your risk for a stroke. In atrial fibrillation, the top portions of the heart (atria) beat out of sync with the lower portions of the heart. When the muscles of the atria are tightening in an uncoordinated way (fibrillating), blood can pool in the heart and form clots. If a clot travels to the brain, it can cause a stroke. This type of stroke is preventable. Understanding atrial fibrillation and knowing how to properly manage it can prevent you from having a stroke. What increases my risk for a stroke? If you have atrial fibrillation, you may be at increased risk for a stroke if you also:  Have heart failure.  Have high blood pressure.  Are older than age 33.  Have diabetes.  Have a history of vascular disease, such as heart attack or stroke.  Are female. If you have atrial fibrillation and you also have one or more of those risk factors, talk with your health care provider about treatments that can prevent a stroke. Other risk factors for a stroke include:  Smoking.  High cholesterol.  Diabetes.  Being inactive (sedentary lifestyle).  Having a family history of stroke.  Eating a diet that is high in fat, cholesterol, and salt. What treatments help to manage atrial fibrillation? The main goals of treatment for atrial  fibrillation are to prevent blood clots from forming and to keep your heart beating at a normal rate and rhythm. Treatment may include:  Blood-thinning medicine (anticoagulant) that helps to prevent clots from forming. This medicine also increases the risk of bleeding. Talk with your health care provider about the risks and benefits of taking anticoagulants.  Medicine that slows the heart rate or brings the heart rhythm back to normal.  Electrical cardioversion. This is a procedure that resets the heart's rhythm by delivering a controlled, low-energy shock through your skin to your heart.  An ablation procedure, such as catheter ablation, catheter ablation with pacemaker, or surgical ablation. These procedures destroy the heart tissues that send abnormal signals so that heart rhythms can be improved or made normal. A pacemaker is a device that is placed under the skin to help the heart beat in a regular rhythm. How can I prevent atrial fibrillation-related stroke? Medicines  Take over-the-counter and prescription medicines only as told by your health care provider.  If your health care provider prescribed an anticoagulant, take it exactly as told. Taking too much blood-thinning medicine can cause bleeding. If you  do not take enough blood-thinning medicine, you will not have the protection that you need against stroke and other problems. Eating and drinking  Eat healthy foods, including at least 5 servings of fruits and vegetables a day.  Do not drink alcohol.  Do not drink beverages that contain caffeine, such as coffee, soda, and tea.  Follow dietary instructions as told by your health care provider. Managing other medical conditions  Manage and be aware of your blood pressure. If you have high blood pressure, follow your treatment plan to keep it in your target range.  Have your cholesterol checked as often as recommended by your health care provider. If you have high cholesterol,  follow your treatment plan to lower it and keep it in your target range.  Talk with your health care provider about symptoms to watch for. Some people may not have any symptoms, so it can be hard to know that they have atrial fibrillation. Talk with your health care provider if you experience: ? A feeling that your heart is beating rapidly or irregularly. ? An irregular pulse. ? A feeling of discomfort or pain in your chest. ? Shortness of breath. ? Sudden light-headedness or weakness. ? Tiredness (fatigue) that happens easily during exercise.  If you have obstructive sleep apnea (OSA), manage your condition as told by your health care provider. General instructions  Maintain a healthy weight. Do not use diet pills unless your health care provider approves. Diet pills may make heart problems worse.  Exercise regularly. Get at least 30 minutes of activity on most or all days, or as told by your health care provider.  Do not use any products that contain nicotine or tobacco, such as cigarettes and e-cigarettes. If you need help quitting, ask your health care provider.  Do not use drugs, such as cocaine and amphetamines.  Keep all follow-up visits as told by your health care providers. This is important. These include visits with your heart specialist. Where to find more information You may find more information about preventing atrial fibrillation-related stroke from:  National Stroke Association (AFib-Stroke Connection): www.stroke.org Contact a health care provider if:  You notice a change in the rate, rhythm, or strength of your heartbeat.  You have dizziness.  You are taking an anticoagulant and you have more bruises than usual.  You tire out more easily when you exercise or do similar activities. Get help right away if:   You have chest pain.  You have pain in your abdomen.  You experience unusual sweating or weakness.  You take anticoagulants and you: ? Have severe  headaches or confusion. ? Have blood in your vomit, bowel movement, or urine. ? Have bleeding that will not stop. ? Fall or injure your head.  You have any symptoms of a stroke. "BE FAST" is an easy way to remember the main warning signs of a stroke: ? B - Balance. Signs are dizziness, trouble walking, or loss of balance. ? E - Eyes. Signs are trouble seeing or a sudden change in vision. ? F - Face. Signs are sudden weakness or numbness of the face, or the face or eyelid drooping on one side. ? A - Arms. Signs are weakness or numbness in an arm. This happens suddenly and usually on one side of the body. ? S - Speech. Signs are sudden trouble speaking, slurred speech, or trouble understanding what people say. ? T - Time. Time to call emergency services. Write down what time symptoms started.  You have other signs of a stroke, such as: ? A sudden, severe headache with no known cause. ? Nausea or vomiting. ? Seizure. These symptoms may represent a serious problem that is an emergency. Do not wait to see if the symptoms will go away. Get medical help right away. Call your local emergency services (911 in the [place] Do not drive yourself to the hospital. Summary  Having atrial fibrillation increases the risk for a stroke. Talk with your health care provider about what symptoms to watch for.  Atrial fibrillation-related stroke is preventable. Proper management of atrial fibrillation can prevent you from having a stroke.  Talk with your health care provider about whether anticoagulant medicine is right for you.  Learn the warning signs of a stroke and remember "BE FAST." This information is not intended to replace advice given to you by your health care provider. Make sure you discuss any questions you have with your health care provider. Document Revised: 08/31/2018 Document Reviewed: 08/21/2016 Elsevier Patient Education  [place]. Atrial Fibrillation  Atrial fibrillation is a  type of irregular or rapid heartbeat (arrhythmia). In atrial fibrillation, the top part of the heart (atria) beats in an irregular pattern. This makes the heart unable to pump blood normally and effectively. The goal of treatment is to prevent blood clots from forming, control your heart rate, or restore your heartbeat to a normal rhythm. If this condition is not treated, it can cause serious problems, such as a weakened heart muscle (cardiomyopathy) or a stroke. What are the causes? This condition is often caused by medical conditions that damage the heart's electrical system. These include:  High blood pressure (hypertension). This is the most common cause.  Certain heart problems or conditions, such as heart failure, coronary artery disease, heart valve problems, or heart surgery.  Diabetes.  Overactive thyroid (hyperthyroidism).  Obesity.  Chronic kidney disease. In some cases, the cause of this condition is not known. What increases the risk? This condition is more likely to develop in:  Older people.  People who smoke.  Athletes who do endurance exercise.  People who have a family history of atrial fibrillation.  Men.  People who use drugs.  People who drink a lot of alcohol.  People who have lung conditions, such as emphysema, pneumonia, or COPD.  People who have obstructive sleep apnea. What are the signs or symptoms? Symptoms of this condition include:  A feeling that your heart is racing or beating irregularly.  Discomfort or pain in your chest.  Shortness of breath.  Sudden light-headedness or weakness.  Tiring easily during exercise or activity.  Fatigue.  Syncope (fainting).  Sweating. In some cases, there are no symptoms. How is this diagnosed? Your health care provider may detect atrial fibrillation when taking your pulse. If detected, this condition may be diagnosed with:  An electrocardiogram (ECG) to check electrical signals of the  heart.  An ambulatory cardiac monitor to record your heart's activity for a few days.  A transthoracic echocardiogram (TTE) to create pictures of your heart.  A transesophageal echocardiogram (TEE) to create even closer pictures of your heart.  A stress test to check your blood supply while you exercise.  Imaging tests, such as a CT scan or chest X-ray.  Blood tests. How is this treated? Treatment depends on underlying conditions and how you feel when you experience atrial fibrillation. This condition may be treated with:  Medicines to prevent blood clots or to treat heart rate or heart  rhythm problems.  Electrical cardioversion to reset the heart's rhythm.  A pacemaker to correct abnormal heart rhythm.  Ablation to remove the heart tissue that sends abnormal signals.  Left atrial appendage closure to seal the area where blood clots can form. In some cases, underlying conditions will be treated. Follow these instructions at home: Medicines  Take over-the counter and prescription medicines only as told by your health care provider.  Do not take any new medicines without talking to your health care provider.  If you are taking blood thinners: ? Talk with your health care provider before you take any medicines that contain aspirin or NSAIDs, such as ibuprofen. These medicines increase your risk for dangerous bleeding. ? Take your medicine exactly as told, at the same time every day. ? Avoid activities that could cause injury or bruising, and follow instructions about how to prevent falls. ? Wear a medical alert bracelet or carry a card that lists what medicines you take. Lifestyle      Do not use any products that contain nicotine or tobacco, such as cigarettes, e-cigarettes, and chewing tobacco. If you need help quitting, ask your health care provider.  Eat heart-healthy foods. Talk with a dietitian to make an eating plan that is right for you.  Exercise regularly as told  by your health care provider.  Do not drink alcohol.  Lose weight if you are overweight.  Do not use drugs, including cannabis. General instructions  If you have obstructive sleep apnea, manage your condition as told by your health care provider.  Do not use diet pills unless your health care provider approves. Diet pills can make heart problems worse.  Keep all follow-up visits as told by your health care provider. This is important. Contact a health care provider if you:  Notice a change in the rate, rhythm, or strength of your heartbeat.  Are taking a blood thinner and you notice more bruising.  Tire more easily when you exercise or do heavy work.  Have a sudden change in weight. Get help right away if you have:   Chest pain, abdominal pain, sweating, or weakness.  Trouble breathing.  Side effects of blood thinners, such as blood in your vomit, stool, or urine, or bleeding that cannot stop.  Any symptoms of a stroke. "BE FAST" is an easy way to remember the main warning signs of a stroke: ? B - Balance. Signs are dizziness, sudden trouble walking, or loss of balance. ? E - Eyes. Signs are trouble seeing or a sudden change in vision. ? F - Face. Signs are sudden weakness or numbness of the face, or the face or eyelid drooping on one side. ? A - Arms. Signs are weakness or numbness in an arm. This happens suddenly and usually on one side of the body. ? S - Speech. Signs are sudden trouble speaking, slurred speech, or trouble understanding what people say. ? T - Time. Time to call emergency services. Write down what time symptoms started.  Other signs of a stroke, such as: ? A sudden, severe headache with no known cause. ? Nausea or vomiting. ? Seizure. These symptoms may represent a serious problem that is an emergency. Do not wait to see if the symptoms will go away. Get medical help right away. Call your local emergency services (911 in the [place] Do not drive yourself  to the hospital. Summary  Atrial fibrillation is a type of irregular or rapid heartbeat (arrhythmia).  Symptoms include a feeling  that your heart is beating fast or irregularly.  You may be given medicines to prevent blood clots or to treat heart rate or heart rhythm problems.  Get help right away if you have signs or symptoms of a stroke.  Get help right away if you cannot catch your breath or have chest pain or pressure. This information is not intended to replace advice given to you by your health care provider. Make sure you discuss any questions you have with your health care provider. Document Revised: 10/28/2018 Document Reviewed: 10/28/2018 Elsevier Patient Education  [place] on my medicine - ELIQUIS (apixaban)  This medication education was reviewed with me or my healthcare representative as part of my discharge preparation.    Why was Eliquis prescribed for you? Eliquis was prescribed for you to reduce the risk of a blood clot forming that can cause a stroke if you have a medical condition called atrial fibrillation (a type of irregular heartbeat).  What do You need to know about Eliquis ? Take your Eliquis TWICE DAILY - one tablet in the morning and one tablet in the evening with or without food. If you have difficulty swallowing the tablet whole please discuss with your pharmacist how to take the medication safely.  Take Eliquis exactly as prescribed by your doctor and DO NOT stop taking Eliquis without talking to the doctor who prescribed the medication.  Stopping may increase your risk of developing a stroke.  Refill your prescription before you run out.  After discharge, you should have regular check-up appointments with your healthcare provider that is prescribing your Eliquis.  In the future your dose may need to be changed if your kidney function or weight changes by a significant amount or as you get older.  What do you do if you miss  a dose? If you miss a dose, take it as soon as you remember on the same day and resume taking twice daily.  Do not take more than one dose of ELIQUIS at the same time to make up a missed dose.  Important Safety Information A possible side effect of Eliquis is bleeding. You should call your healthcare provider right away if you experience any of the following: ? Bleeding from an injury or your nose that does not stop. ? Unusual colored urine (red or dark brown) or unusual colored stools (red or black). ? Unusual bruising for unknown reasons. ? A serious fall or if you hit your head (even if there is no bleeding).  Some medicines may interact with Eliquis and might increase your risk of bleeding or clotting while on Eliquis. To help avoid this, consult your healthcare provider or pharmacist prior to using any new prescription or non-prescription medications, including herbals, vitamins, non-steroidal anti-inflammatory drugs (NSAIDs) and supplements.  This website has more information on Eliquis (apixaban): http://[email or web address].com/eliquis/home

## 2019-09-20 DIAGNOSIS — J441 Chronic obstructive pulmonary disease with (acute) exacerbation: Secondary | ICD-10-CM | POA: Diagnosis not present

## 2019-09-20 DIAGNOSIS — I4891 Unspecified atrial fibrillation: Secondary | ICD-10-CM | POA: Diagnosis not present

## 2019-09-20 DIAGNOSIS — G4733 Obstructive sleep apnea (adult) (pediatric): Secondary | ICD-10-CM | POA: Diagnosis not present

## 2019-09-20 DIAGNOSIS — J4520 Mild intermittent asthma, uncomplicated: Secondary | ICD-10-CM | POA: Diagnosis not present

## 2019-09-20 DIAGNOSIS — I5032 Chronic diastolic (congestive) heart failure: Secondary | ICD-10-CM | POA: Diagnosis not present

## 2019-09-20 DIAGNOSIS — I700 Atherosclerosis of aorta: Secondary | ICD-10-CM | POA: Diagnosis not present

## 2019-09-20 DIAGNOSIS — M19041 Primary osteoarthritis, right hand: Secondary | ICD-10-CM | POA: Diagnosis not present

## 2019-09-20 DIAGNOSIS — J440 Chronic obstructive pulmonary disease with acute lower respiratory infection: Secondary | ICD-10-CM | POA: Diagnosis not present

## 2019-09-20 DIAGNOSIS — I110 Hypertensive heart disease with heart failure: Secondary | ICD-10-CM | POA: Diagnosis not present

## 2019-09-20 DIAGNOSIS — G629 Polyneuropathy, unspecified: Secondary | ICD-10-CM | POA: Diagnosis not present

## 2019-09-20 DIAGNOSIS — M19042 Primary osteoarthritis, left hand: Secondary | ICD-10-CM | POA: Diagnosis not present

## 2019-09-20 DIAGNOSIS — J189 Pneumonia, unspecified organism: Secondary | ICD-10-CM | POA: Diagnosis not present

## 2019-09-22 ENCOUNTER — Other Ambulatory Visit: Payer: Self-pay

## 2019-09-22 ENCOUNTER — Ambulatory Visit
Admission: RE | Admit: 2019-09-22 | Discharge: 2019-09-22 | Disposition: A | Discharge status: Home / Self Care | Payer: Medicare Other | Source: Ambulatory Visit | Attending: Family Medicine | Admitting: Family Medicine

## 2019-09-22 DIAGNOSIS — M48061 Spinal stenosis, lumbar region without neurogenic claudication: Secondary | ICD-10-CM | POA: Diagnosis not present

## 2019-09-24 DIAGNOSIS — I480 Paroxysmal atrial fibrillation: Secondary | ICD-10-CM | POA: Diagnosis not present

## 2019-09-24 DIAGNOSIS — I10 Essential (primary) hypertension: Secondary | ICD-10-CM | POA: Diagnosis not present

## 2019-09-24 DIAGNOSIS — D6869 Other thrombophilia: Secondary | ICD-10-CM | POA: Diagnosis not present

## 2019-09-24 DIAGNOSIS — J441 Chronic obstructive pulmonary disease with (acute) exacerbation: Secondary | ICD-10-CM | POA: Diagnosis not present

## 2019-09-24 DIAGNOSIS — I509 Heart failure, unspecified: Secondary | ICD-10-CM | POA: Diagnosis not present

## 2019-09-24 DIAGNOSIS — J9601 Acute respiratory failure with hypoxia: Secondary | ICD-10-CM | POA: Diagnosis not present

## 2019-09-24 DIAGNOSIS — J029 Acute pharyngitis, unspecified: Secondary | ICD-10-CM | POA: Diagnosis not present

## 2019-09-24 DIAGNOSIS — M48061 Spinal stenosis, lumbar region without neurogenic claudication: Secondary | ICD-10-CM | POA: Diagnosis not present

## 2019-10-01 ENCOUNTER — Encounter: Payer: Self-pay | Admitting: Cardiology

## 2019-10-01 ENCOUNTER — Other Ambulatory Visit: Payer: Self-pay

## 2019-10-01 ENCOUNTER — Ambulatory Visit (INDEPENDENT_AMBULATORY_CARE_PROVIDER_SITE_OTHER): Payer: Medicare Other | Admitting: Cardiology

## 2019-10-01 ENCOUNTER — Telehealth: Payer: Self-pay | Admitting: Radiology

## 2019-10-01 VITALS — BP 90/60 | HR 100 | Ht 62.0 in | Wt 228.0 lb

## 2019-10-01 DIAGNOSIS — I4891 Unspecified atrial fibrillation: Secondary | ICD-10-CM | POA: Diagnosis not present

## 2019-10-01 DIAGNOSIS — I950 Idiopathic hypotension: Secondary | ICD-10-CM | POA: Diagnosis not present

## 2019-10-01 MED ORDER — METOPROLOL SUCCINATE ER 50 MG PO TB24: 50.0000 mg | ORAL_TABLET | Freq: Every day | ORAL | 1 refills | Status: DC

## 2019-10-01 NOTE — Patient Instructions (Signed)
Medication Instructions:   STOP TAKING CARDIZEM CD (DILTIAZEM) NOW  STOP TAKING VALSARTAN/HCTZ NOW  START TAKING METOPROLOL SUCCINATE (TOPROL XL) 50 MG BY MOUTH DAILY   *If you need a refill on your cardiac medications before your next appointment, please call your pharmacy*   Testing/Procedures:  ZIO XT- Long Term Monitor Instructions   Your physician has requested you wear your ZIO patch monitor_14______days.   This is a single patch monitor.  [employer] supplies one patch monitor per enrollment.  Additional stickers are not available.   Please do not apply patch if you will be having a Nuclear Stress Test, Echocardiogram, Cardiac CT, MRI, or Chest Xray during the time frame you would be wearing the monitor. The patch cannot be worn during these tests.  You cannot remove and re-apply the ZIO XT patch monitor.   Your ZIO patch monitor will be sent USPS Priority mail from [facility] directly to your home address. The monitor may also be mailed to a PO BOX if home delivery is not available.   It may take 3-5 days to receive your monitor after you have been enrolled.   Once you have received you monitor, please review enclosed instructions.  Your monitor has already been registered assigning a specific monitor serial # to you.   Applying the monitor   Shave hair from upper left chest.   Hold abrader disc by orange tab.  Rub abrader in 40 strokes over left upper chest as indicated in your monitor instructions.   Clean area with 4 enclosed alcohol pads .  Use all pads to assure are is cleaned thoroughly.  Let dry.   Apply patch as indicated in monitor instructions.  Patch will be place under collarbone on left side of chest with arrow pointing upward.   Rub patch adhesive wings for 2 minutes.Remove white label marked "1".  Remove white label marked "2".  Rub patch adhesive wings for 2 additional minutes.   While looking in a mirror, press and release button in center of patch.   A small green light will flash 3-4 times .  This will be your only indicator the monitor has been turned on.     Do not shower for the first 24 hours.  You may shower after the first 24 hours.   Press button if you feel a symptom. You will hear a small click.  Record Date, Time and Symptom in the Patient Log Book.   When you are ready to remove patch, follow instructions on last 2 pages of Patient Log Book.  Stick patch monitor onto last page of Patient Log Book.   Place Patient Log Book in [place] box.  Use locking tab on box and tape box closed securely.  The [employer] and [employer] has [employer] on it.  Please place in mailbox as soon as possible.  Your physician should have your test results approximately 7 days after the monitor has been mailed back to [facility].   Call [place] at [phone] if you have questions regarding your ZIO XT patch monitor.  Call them immediately if you see an orange light blinking on your monitor.   If your monitor falls off in less than 4 days contact our Monitor department at [phone].  If your monitor becomes loose or falls off after 4 days call [employer] at [phone] for suggestions on securing your monitor.     Follow-Up:  4 WEEKS WITH AN EXTENDER IN OUR OFFICE.

## 2019-10-01 NOTE — Progress Notes (Signed)
Cardiology Office Note:    Date:  10/01/2019   ID:  [name] Molina [birth year], MRN [id number]  PCP:  [name] Stains, MD  Cardiologist:  [name] Furbish, MD  Electrophysiologist:  None   Referring MD: [name] Stains, MD     History of Present Illness:    [name] Molina is a 74 y.o. female with recent hospitalization here for follow-up.  Had a consult by Dr. Percival Molina on 09/16/2019 secondary to new onset atrial fibrillation with rapid ventricular response.  He started metoprolol 25 every 6 for rate control.  Atrial fibrillation was paroxysmal.  Because she fell about once every 2 months mechanical falls, deconditioning, careful with Eliquis.  Please see below for details.  Past Medical History:  Diagnosis Date  . Anxiety   . Asthma   . Atrial tachycardia ([name] Molina)   . Cataract   . COPD (chronic obstructive pulmonary disease) ([place])   . Diverticulosis   . GERD (gastroesophageal reflux disease)   . Hyperlipidemia   . Hypertension   . Insomnia   . Macular degeneration   . Migraines   . Neuropathy     Past Surgical History:  Procedure Laterality Date  . CERVICAL POLYPECTOMY     INSIDE AND OUTSIDE  . COLONOSCOPY    . POLYPECTOMY    . REMOVED CARTLIAGE RIB      Current Medications: Current Meds  Medication Sig  . acetaminophen (TYLENOL) 500 MG tablet Take 1,000 mg by mouth every 6 (six) hours as needed for moderate pain or headache.  . albuterol (PROVENTIL HFA;VENTOLIN HFA) 108 (90 Base) MCG/ACT inhaler Inhale 2 puffs into the lungs every 6 (six) hours as needed for wheezing or shortness of breath.  Marland Kitchen apixaban (ELIQUIS) 5 MG TABS tablet Take 1 tablet (5 mg total) by mouth 2 (two) times daily.  Marland Kitchen aspirin 81 MG tablet Take 81 mg by mouth daily.    Marland Kitchen aspirin-acetaminophen-caffeine (EXCEDRIN MIGRAINE) 250-250-65 MG per tablet Take 2 tablets by mouth every 6 (six) hours as needed for headache.  . benzonatate (TESSALON) 200 MG capsule Take 1 capsule (200 mg total) by mouth 3 (three)  times daily as needed for cough.  . BESIVANCE 0.6 % SUSP Place 1 drop into the left eye See admin instructions. Instill 1 drop qid day of eye injection and qid the day after.  . clobetasol cream (TEMOVATE) [id number] % Apply 1 application topically daily as needed (yeast.).   Marland Kitchen FLUoxetine (PROZAC) 40 MG capsule Take 40 mg by mouth every morning.  . fluticasone (FLONASE) 50 MCG/ACT nasal spray Place 1 spray into both nostrils daily.  . Fluticasone-Umeclidin-Vilant (TRELEGY ELLIPTA) 100-62.5-25 MCG/INH AEPB Take 1 puff by mouth daily.  . furosemide (LASIX) 40 MG tablet Take 1 tablet (40 mg total) by mouth daily.  Marland Kitchen ibuprofen (ADVIL,MOTRIN) 200 MG tablet Take 400 mg by mouth every 6 (six) hours as needed for headache or moderate pain.  Marland Kitchen lactase (LACTAID) 3000 units tablet Take 3,000 Units by mouth 3 (three) times daily as needed (lactose ingestion).   Marland Kitchen loratadine-pseudoephedrine (CLARITIN-D 12-HOUR) 5-120 MG per tablet Take 1 tablet by mouth daily.  Marland Kitchen LORazepam (ATIVAN) 0.5 MG tablet Take 0.5-1 mg by mouth daily as needed for anxiety. Anxiety  . montelukast (SINGULAIR) 10 MG tablet Take 10 mg by mouth daily as needed.   . Multiple Vitamins-Minerals (OCUVITE PO) Take 1 tablet by mouth 2 (two) times daily.   Marland Kitchen omeprazole (PRILOSEC) 20 MG capsule Take 20 mg by mouth at bedtime.   Marland Kitchen  pravastatin (PRAVACHOL) 40 MG tablet Take 40 mg by mouth at bedtime.   . tizanidine (ZANAFLEX) 2 MG capsule Take 2 mg by mouth at bedtime as needed for muscle spasms (head pain per pt).   . [DISCONTINUED] diltiazem (CARDIZEM CD) 360 MG 24 hr capsule Take 1 capsule (360 mg total) by mouth daily.  . [DISCONTINUED] valsartan-hydrochlorothiazide (DIOVAN-HCT) 160-12.5 MG tablet Take 0.5 tablets by mouth daily.      Allergies:   Food, Tequin, Codeine, Erythromycin, Levaquin [levofloxacin in d5w], Wellbutrin [bupropion hcl], Penicillins, and Prednisone   Social History   Socioeconomic History  . Marital status: Divorced    Spouse  name: Not on file  . Number of children: 3  . Years of education: College  . Highest education level: Not on file  Occupational History  . Occupation: Emergency planning/management officer  Tobacco Use  . Smoking status: Former Smoker    Packs/day: 1.00    Years: 20.00    Pack years: 20.00    Types: Cigarettes    Quit date: 05/21/1991    Years since quitting: 28.3  . Smokeless tobacco: Never Used  Substance and Sexual Activity  . Alcohol use: No  . Drug use: No  . Sexual activity: Not on file  Other Topics Concern  . Not on file  Social History Narrative   Patient is divorced with 3 children.   Patient is right handed.   Patient has a college education.   Patient drinks 1-2 servings daily.   Social Determinants of Health   Financial Resource Strain:   . Difficulty of Paying Living Expenses:   Food Insecurity:   . Worried About Charity fundraiser in the Last Year:   . Arboriculturist in the Last Year:   Transportation Needs:   . Film/video editor (Medical):   Marland Kitchen Lack of Transportation (Non-Medical):   Physical Activity:   . Days of Exercise per Week:   . Minutes of Exercise per Session:   Stress:   . Feeling of Stress :   Social Connections:   . Frequency of Communication with Friends and Family:   . Frequency of Social Gatherings with Friends and Family:   . Attends Religious Services:   . Active Member of Clubs or Organizations:   . Attends Archivist Meetings:   Marland Kitchen Marital Status:      Family History: The patient's family history includes Asthma in her father; Breast cancer (age of onset: 7) in her maternal grandmother; Colon polyps (age of onset: 36) in her sister; Emphysema in her father; Heart disease in her father and paternal grandfather. There is no history of Colon cancer.  ROS:   Please see the history of present illness.     All other systems reviewed and are negative.  EKGs/Labs/Other Studies Reviewed:    The following studies were reviewed  today:  08/2019 1. Left ventricular ejection fraction, by estimation, is 65 to 70%. The  left ventricle has normal function. The left ventricle has no regional  wall motion abnormalities. Left ventricular diastolic parameters are  indeterminate.  2. Right ventricular systolic function is normal. The right ventricular  size is normal. There is normal pulmonary artery systolic pressure.  3. Left atrial size was mildly dilated.  4. The mitral valve is normal in structure. Trivial mitral valve  regurgitation. No evidence of mitral stenosis.  5. The aortic valve is normal in structure. Aortic valve regurgitation is  not visualized. No aortic stenosis is present.  6. The inferior vena cava is dilated in size with <50% respiratory  variability, suggesting right atrial pressure of 15 mmHg.   EKG:  EKG is  ordered today.  The ekg ordered today demonstrates sinus rhythm with PACs 98 bpm  Recent Labs: 09/13/2019: B Natriuretic Peptide 335.9 09/16/2019: Hemoglobin 13.4; Platelets 214; TSH 1.381 09/18/2019: BUN 28; Creatinine, Ser 0.72; Magnesium 2.0; Potassium 3.9; Sodium 138  Recent Lipid Panel No results found for: CHOL, TRIG, HDL, CHOLHDL, VLDL, LDLCALC, LDLDIRECT  Physical Exam:    VS:  BP 90/60   Pulse 100   Ht 5\' 2"  (1.575 m)   Wt 228 lb (103.4 kg)   SpO2 94%   BMI 41.70 kg/m     Wt Readings from Last 3 Encounters:  10/01/19 228 lb (103.4 kg)  09/18/19 239 lb 12.8 oz (108.8 kg)  05/09/19 225 lb (102.1 kg)     GEN:  Well nourished, well developed in no acute distress HEENT: Normal NECK: No JVD; No carotid bruits LYMPHATICS: No lymphadenopathy CARDIAC: Tachy PAC, no murmurs, rubs, gallops RESPIRATORY:  Clear to auscultation without rales, wheezing or rhonchi  ABDOMEN: Soft, non-tender, non-distended MUSCULOSKELETAL:  No edema; No deformity  SKIN: Warm and dry NEUROLOGIC:  Alert and oriented x 3 PSYCHIATRIC:  Normal affect   ASSESSMENT:    1. Atrial fibrillation with  RVR ([place])   2. Morbid obesity ([place])   3. Idiopathic hypotension    PLAN:    In order of problems listed above:  Paroxysmal atrial fibrillation Hypotension -Newly diagnosed during hospitalization in April 2021. -Eliquis was started -She had been on metoprolol 100 mg, diltiazem 360 mg, valsartan hydrochlorothiazide 1/2 pill.  Her blood pressures have been quite low 90/60 today. -Dr. Dema Severin had stopped her valsartan hydrochlorothiazide and metoprolol.  She has been taking the diltiazem.  She is also taking furosemide 40 mg a day. -I think I would rather use metoprolol which affects blood pressure less then diltiazem therefore we will stop the diltiazem 360 and place her on half the dose of the metoprolol 50 mg once a day.  Continue to stop/hold the valsartan hydrochlorothiazide. -I talked her about maybe holding the furosemide however her daughter states that her blood pressures are usually in this general range and she was hesitant to stop it.  She has battled with some lower extremity edema.  She is unable to stand on the scale without falling at home.  Peripheral neuropathy. -I will place a Zio patch monitor to see how we are doing with paroxysmal atrial fibrillation, rate control etc.  Chronic anticoagulation -Continue with Eliquis.  Watch closely with falls.  COPD -Dry shortness of breath.  Chronic diastolic heart failure -Lasix 40 mg a day.  Morbid obesity -Contributing to dyspnea as well.    Medication Adjustments/Labs and Tests Ordered: Current medicines are reviewed at length with the patient today.  Concerns regarding medicines are outlined above.  Orders Placed This Encounter  Procedures  . LONG TERM MONITOR (3-14 DAYS)  . EKG 12-Lead  . EKG 12-Lead   Meds ordered this encounter  Medications  . metoprolol succinate (TOPROL-XL) 50 MG 24 hr tablet    Sig: Take 1 tablet (50 mg total) by mouth daily. Take with or immediately following a meal.    Dispense:  90 tablet     Refill:  1    Patient Instructions  Medication Instructions:   STOP TAKING CARDIZEM CD (DILTIAZEM) NOW  STOP TAKING VALSARTAN/HCTZ NOW  START TAKING METOPROLOL SUCCINATE (TOPROL  XL) 50 MG BY MOUTH DAILY   *If you need a refill on your cardiac medications before your next appointment, please call your pharmacy*   Testing/Procedures:  ZIO XT- Long Term Monitor Instructions   Your physician has requested you wear your ZIO patch monitor_14______days.   This is a single patch monitor.  [employer] supplies one patch monitor per enrollment.  Additional stickers are not available.   Please do not apply patch if you will be having a Nuclear Stress Test, Echocardiogram, Cardiac CT, MRI, or Chest Xray during the time frame you would be wearing the monitor. The patch cannot be worn during these tests.  You cannot remove and re-apply the ZIO XT patch monitor.   Your ZIO patch monitor will be sent USPS Priority mail from [facility] directly to your home address. The monitor may also be mailed to a PO BOX if home delivery is not available.   It may take 3-5 days to receive your monitor after you have been enrolled.   Once you have received you monitor, please review enclosed instructions.  Your monitor has already been registered assigning a specific monitor serial # to you.   Applying the monitor   Shave hair from upper left chest.   Hold abrader disc by orange tab.  Rub abrader in 40 strokes over left upper chest as indicated in your monitor instructions.   Clean area with 4 enclosed alcohol pads .  Use all pads to assure are is cleaned thoroughly.  Let dry.   Apply patch as indicated in monitor instructions.  Patch will be place under collarbone on left side of chest with arrow pointing upward.   Rub patch adhesive wings for 2 minutes.Remove white label marked "1".  Remove white label marked "2".  Rub patch adhesive wings for 2 additional minutes.   While looking in a mirror,  press and release button in center of patch.  A small green light will flash 3-4 times .  This will be your only indicator the monitor has been turned on.     Do not shower for the first 24 hours.  You may shower after the first 24 hours.   Press button if you feel a symptom. You will hear a small click.  Record Date, Time and Symptom in the Patient Log Book.   When you are ready to remove patch, follow instructions on last 2 pages of Patient Log Book.  Stick patch monitor onto last page of Patient Log Book.   Place Patient Log Book in [place] box.  Use locking tab on box and tape box closed securely.  The [employer] has [employer] on it.  Please place in mailbox as soon as possible.  Your physician should have your test results approximately 7 days after the monitor has been mailed back to [facility]   Call [place] at [phone] if you have questions regarding your ZIO XT patch monitor.  Call them immediately if you see an orange light blinking on your monitor.   If your monitor falls off in less than 4 days contact our Monitor department at [phone].  If your monitor becomes loose or falls off after 4 days call [employer] at [phone] for suggestions on securing your monitor.     Follow-Up:  4 WEEKS WITH AN EXTENDER IN OUR OFFICE.       Signed, [name] Furbish, MD  10/01/2019 12:52 PM    [facility]

## 2019-10-01 NOTE — Telephone Encounter (Signed)
Enrolled patient for a 14 day Zio monitor to be mailed to patients home.  

## 2019-10-09 ENCOUNTER — Other Ambulatory Visit (INDEPENDENT_AMBULATORY_CARE_PROVIDER_SITE_OTHER): Payer: Medicare Other

## 2019-10-09 ENCOUNTER — Other Ambulatory Visit: Payer: Self-pay | Admitting: Pulmonary Disease

## 2019-10-09 DIAGNOSIS — I4891 Unspecified atrial fibrillation: Secondary | ICD-10-CM | POA: Diagnosis not present

## 2019-10-20 DIAGNOSIS — J441 Chronic obstructive pulmonary disease with (acute) exacerbation: Secondary | ICD-10-CM | POA: Diagnosis not present

## 2019-10-28 ENCOUNTER — Ambulatory Visit: Payer: Medicare Other | Admitting: Physician Assistant

## 2019-11-02 NOTE — Progress Notes (Signed)
Cardiology Office Note   Date:  11/03/2019   ID:  Marque, Bango 1945-05-26, MRN [id number]  PCP:  Harlan Stains, MD  Cardiologist:  Dr. Marlou Porch  No chief complaint on file.    History of Present Illness: [name] Molina is a 74 y.o. female who presents for ongoing assessment and management of atrial fibrillation.  Initially seen in consultation during recent hospitalization by Dr. Percival Spanish in the setting of new onset atrial fibrillation with RVR.  The patient was started on metoprolol 25 mg every 6 hours.  Atrial fibrillation was paroxysmal.  The patient is very deconditioned and falls often and therefore careful monitoring on Eliquis is recommended.  The patient was last seen by Dr. Marlou Porch on 10/01/2019.  Medications were adjusted as she had been on metoprolol 100 mg daily, diltiazem 360 mg daily, valsartan/HCTZ 1/2 pill daily.  Blood pressures were hypotensive at 90/60.  Her primary care physician Dr. Dema Severin stopped the valsartan HCTZ and metoprolol and kept her only on diltiazem.  She is also taking furosemide 40 mg a day.  Dr. Marlou Porch made additional changes and stop diltiazem and placed her back on metoprolol 50 mg daily as he felt this was a better heart rate medication and does not significantly impact blood pressure.  Metoprolol was started at a lower dose of 50 mg once a day.  He agreed with holding valsartan/HCTZ.  She was continued on Lasix but to use as needed for lower extremity edema.  Dr. Marlou Porch placed Zio cardiac monitor to evaluate paroxysmal atrial fibrillation, and evaluation of rate control on current dose of metoprolol.    She reports that she has fallen once since being discharged from the hospital.  She was out walking in her yard in the heat and fell into the grass.  She did not lose consciousness, feel dizzy, or have any palpitations associated.  She believes it is because she was not watching where she was going.  She did not injure herself.  She has been medically  compliant and did complete the ZIO monitor we are awaiting results at the time of this office visit.  Past Medical History:  Diagnosis Date  . Anxiety   . Asthma   . Atrial tachycardia ([place])   . Cataract   . COPD (chronic obstructive pulmonary disease) ([place])   . Diverticulosis   . GERD (gastroesophageal reflux disease)   . Hyperlipidemia   . Hypertension   . Insomnia   . Macular degeneration   . Migraines   . Neuropathy     Past Surgical History:  Procedure Laterality Date  . CERVICAL POLYPECTOMY     INSIDE AND OUTSIDE  . COLONOSCOPY    . POLYPECTOMY    . REMOVED CARTLIAGE RIB       Current Outpatient Medications  Medication Sig Dispense Refill  . acetaminophen (TYLENOL) 500 MG tablet Take 1,000 mg by mouth every 6 (six) hours as needed for moderate pain or headache.    . albuterol (PROVENTIL HFA;VENTOLIN HFA) 108 (90 Base) MCG/ACT inhaler Inhale 2 puffs into the lungs every 6 (six) hours as needed for wheezing or shortness of breath. 1 Inhaler 6  . apixaban (ELIQUIS) 5 MG TABS tablet Take 1 tablet (5 mg total) by mouth 2 (two) times daily. 180 tablet 0  . aspirin 81 MG tablet Take 81 mg by mouth daily.      Marland Kitchen aspirin-acetaminophen-caffeine (EXCEDRIN MIGRAINE) 250-250-65 MG per tablet Take 2 tablets by mouth every 6 (six) hours as  needed for headache.    . benzonatate (TESSALON) 200 MG capsule Take 1 capsule (200 mg total) by mouth 3 (three) times daily as needed for cough. 30 capsule 1  . BESIVANCE 0.6 % SUSP Place 1 drop into the left eye See admin instructions. Instill 1 drop qid day of eye injection and qid the day after.    . clobetasol cream (TEMOVATE) 0.73 % Apply 1 application topically daily as needed (yeast.).     Marland Kitchen FLUoxetine (PROZAC) 40 MG capsule Take 40 mg by mouth every morning.  1  . fluticasone (FLONASE) 50 MCG/ACT nasal spray Place 1 spray into both nostrils daily. 16 g 5  . furosemide (LASIX) 40 MG tablet Take 1 tablet (40 mg total) by mouth daily. 90  tablet 0  . ibuprofen (ADVIL,MOTRIN) 200 MG tablet Take 400 mg by mouth every 6 (six) hours as needed for headache or moderate pain.    Marland Kitchen lactase (LACTAID) 3000 units tablet Take 3,000 Units by mouth 3 (three) times daily as needed (lactose ingestion).     Marland Kitchen loratadine-pseudoephedrine (CLARITIN-D 12-HOUR) 5-120 MG per tablet Take 1 tablet by mouth daily.    Marland Kitchen LORazepam (ATIVAN) 0.5 MG tablet Take 0.5-1 mg by mouth daily as needed for anxiety. Anxiety    . metoprolol succinate (TOPROL-XL) 50 MG 24 hr tablet Take 1 tablet (50 mg total) by mouth daily. Take with or immediately following a meal. 90 tablet 1  . montelukast (SINGULAIR) 10 MG tablet Take 10 mg by mouth daily as needed.     . Multiple Vitamins-Minerals (OCUVITE PO) Take 1 tablet by mouth 2 (two) times daily.     Marland Kitchen omeprazole (PRILOSEC) 20 MG capsule Take 20 mg by mouth at bedtime.     . pravastatin (PRAVACHOL) 40 MG tablet Take 40 mg by mouth at bedtime.     . tizanidine (ZANAFLEX) 2 MG capsule Take 2 mg by mouth at bedtime as needed for muscle spasms (head pain per pt).     . TRELEGY ELLIPTA 100-62.5-25 MCG/INH AEPB INHALE 1 PUFF BY MOUTH DAILY 60 each 0   No current facility-administered medications for this visit.    Allergies:   Food, Tequin, Codeine, Erythromycin, Levaquin [levofloxacin in d5w], Wellbutrin [bupropion hcl], Penicillins, and Prednisone    Social History:  The patient  reports that she quit smoking about 28 years ago. Her smoking use included cigarettes. She has a 20.00 pack-year smoking history. She has never used smokeless tobacco. She reports that she does not drink alcohol and does not use drugs.   Family History:  The patient's family history includes Asthma in her father; Breast cancer (age of onset: 59) in her maternal grandmother; Colon polyps (age of onset: 63) in her sister; Emphysema in her father; Heart disease in her father and paternal grandfather.    ROS: All other systems are reviewed and negative.  Unless otherwise mentioned in H&P    PHYSICAL EXAM: VS:  BP (!) 102/56   Pulse 80   Ht 5\' 2"  (1.575 m)   Wt 225 lb 6.4 oz (102.2 kg)   BMI 41.23 kg/m  , BMI Body mass index is 41.23 kg/m. GEN: Well nourished, well developed, in no acute distress HEENT: normal Neck: no JVD, carotid bruits, or masses Cardiac: RRR; no murmurs, rubs, or gallops, nonpitting pretibial edema  Respiratory:  Clear to auscultation bilaterally, normal work of breathing GI: soft, nontender, nondistended, + BS MS: no deformity or atrophy Skin: warm and dry, no rash  Neuro:  Strength and sensation are intact Psych: euthymic mood, full affect   EKG: Sinus rhythm with short PR, PR interval 106 ms.  Heart rate of 80 bpm.  (Personally reviewed).  Recent Labs: 09/13/2019: B Natriuretic Peptide 335.9 10-06-19: Hemoglobin 13.4; Platelets 214; TSH 1.381 09/18/2019: BUN 28; Creatinine, Ser 0.72; Magnesium 2.0; Potassium 3.9; Sodium 138    Lipid Panel No results found for: CHOL, TRIG, HDL, CHOLHDL, VLDL, LDLCALC, LDLDIRECT    Wt Readings from Last 3 Encounters:  11/03/19 225 lb 6.4 oz (102.2 kg)  10/01/19 228 lb (103.4 kg)  09/18/19 239 lb 12.8 oz (108.8 kg)      Other studies Reviewed: Echocardiogram 2019/10/06 1. Left ventricular ejection fraction, by estimation, is 65 to 70%. The  left ventricle has normal function. The left ventricle has no regional  wall motion abnormalities. Left ventricular diastolic parameters are  indeterminate.  2. Right ventricular systolic function is normal. The right ventricular  size is normal. There is normal pulmonary artery systolic pressure.  3. Left atrial size was mildly dilated.  4. The mitral valve is normal in structure. Trivial mitral valve  regurgitation. No evidence of mitral stenosis.  5. The aortic valve is normal in structure. Aortic valve regurgitation is  not visualized. No aortic stenosis is present.  6. The inferior vena cava is dilated in size with  <50% respiratory  variability, suggesting right atrial pressure of 15 mmHg.    ASSESSMENT AND PLAN:  1.  Paroxysmal atrial fibrillation: EKG today reveals sinus rhythm with short PR nonspecific ST-T wave abnormality in the lateral leads.  She continues on Eliquis 5 mg twice daily.  I am concerned that she did have another fall post discharge.  She has been advised not to drive her car, make sure someone is with her when she is ambulating outside, and report any recurrent dizziness or new falls to our office.  I have requested report from cardiac monitor which is not available at this time.  We will call her with the results once they are available for review.  2.  Frequent falls: I did try orthostatics on her today.  She was unable to lay flat and she was unable to climb up on the table.  However sitting blood pressure was 115/63, and a heart rate of 77; standing blood pressure 143/77 with a heart rate of 95, after 2 minutes blood pressure was 122/75 with a heart rate of 89.  She did not complain of any dizziness or near syncope during these position changes.Consider CT scan MD had if ruled out for cardiac etiology  3.  Chronic lower extremity edema: She takes 40 mg of Lasix daily.  May need to consider decreasing the dose to prevent issues with falling.  However her blood pressure did not reveal significant drop with position changes.   Current medicines are reviewed at length with the patient today.  I have spent 45 minutes dedicated to the care of this patient on the date of this encounter to include pre-visit review of records, assessment, management and diagnostic testing,with shared decision making.  Labs/ tests ordered today include: None  Phill Myron. West Pugh, ANP, AACC   11/03/2019 4:06 PM    [facility] [address] Office [phone] Fax [phone]  Notice: This dictation was prepared with Dragon dictation along with smaller phrase  technology. Any transcriptional errors that result from this process are unintentional and may not be corrected upon review.

## 2019-11-03 ENCOUNTER — Encounter: Payer: Self-pay | Admitting: Adult Health

## 2019-11-03 ENCOUNTER — Other Ambulatory Visit: Payer: Self-pay

## 2019-11-03 ENCOUNTER — Ambulatory Visit (INDEPENDENT_AMBULATORY_CARE_PROVIDER_SITE_OTHER): Payer: Medicare Other | Admitting: Adult Health

## 2019-11-03 VITALS — BP 102/56 | HR 80 | Ht 62.0 in | Wt 225.4 lb

## 2019-11-03 DIAGNOSIS — R296 Repeated falls: Secondary | ICD-10-CM

## 2019-11-03 DIAGNOSIS — I4891 Unspecified atrial fibrillation: Secondary | ICD-10-CM

## 2019-11-03 DIAGNOSIS — R600 Localized edema: Secondary | ICD-10-CM

## 2019-11-03 NOTE — Patient Instructions (Signed)
Medication Instructions:  Continue current medications  *If you need a refill on your cardiac medications before your next appointment, please call your pharmacy*   Lab Work: None Ordered   Testing/Procedures: None Ordered   Follow-Up: At [facility], you and your health needs are our priority.  As part of our continuing mission to provide you with exceptional heart care, we have created designated Provider Care Teams.  These Care Teams include your primary Cardiologist (physician) and Advanced Practice Providers (APPs -  Physician Assistants and Nurse Practitioners) who all work together to provide you with the care you need, when you need it.  We recommend signing up for the patient portal called "MyChart".  Sign up information is provided on this After Visit Summary.  MyChart is used to connect with patients for Virtual Visits (Telemedicine).  Patients are able to view lab/test results, encounter notes, upcoming appointments, etc.  Non-urgent messages can be sent to your provider as well.   To learn more about what you can do with MyChart, go to [email or web address].    Your next appointment:   3 month(s)  The format for your next appointment:   In Person  Provider:   You may see Candee Furbish, MD or one of the following Advanced Practice Providers on your designated Care Team:    Truitt Merle, NP  Cecilie Kicks, NP  Kathyrn Drown, NP

## 2019-11-18 ENCOUNTER — Other Ambulatory Visit: Payer: Self-pay | Admitting: Cardiology

## 2019-11-18 NOTE — Telephone Encounter (Signed)
*  STAT* If patient is at the pharmacy, call can be transferred to refill team.   1. Which medications need to be refilled? (please list name of each medication and dose if known) apixaban (ELIQUIS) 5 MG TABS tablet  2. Which pharmacy/location (including street and city if local pharmacy) is medication to be sent to? [facility] [place]  3. Do they need a 30 day or 90 day supply? 90 day

## 2019-11-19 MED ORDER — APIXABAN 5 MG PO TABS: 5.0000 mg | ORAL_TABLET | Freq: Two times a day (BID) | ORAL | 0 refills | Status: DC

## 2019-11-19 NOTE — Telephone Encounter (Signed)
Refilled on 5/1, but [facility]. Will send 90 ds

## 2019-11-24 ENCOUNTER — Other Ambulatory Visit: Payer: Self-pay

## 2019-11-24 DIAGNOSIS — I4891 Unspecified atrial fibrillation: Secondary | ICD-10-CM

## 2019-11-24 MED ORDER — METOPROLOL SUCCINATE ER 50 MG PO TB24: 50.0000 mg | ORAL_TABLET | Freq: Every day | ORAL | 3 refills | Status: DC

## 2019-11-24 NOTE — Telephone Encounter (Signed)
Pt's medication was sent to pt's pharmacy as requested. Confirmation received.  °

## 2019-11-29 ENCOUNTER — Other Ambulatory Visit: Payer: Self-pay | Admitting: Emergency Medicine

## 2019-12-06 ENCOUNTER — Other Ambulatory Visit: Payer: Self-pay | Admitting: Emergency Medicine

## 2019-12-06 NOTE — Telephone Encounter (Signed)
Pt called about this. Please refill trelogy.

## 2019-12-08 DIAGNOSIS — J4520 Mild intermittent asthma, uncomplicated: Secondary | ICD-10-CM | POA: Diagnosis not present

## 2019-12-08 DIAGNOSIS — J449 Chronic obstructive pulmonary disease, unspecified: Secondary | ICD-10-CM | POA: Diagnosis not present

## 2019-12-08 DIAGNOSIS — E785 Hyperlipidemia, unspecified: Secondary | ICD-10-CM | POA: Diagnosis not present

## 2019-12-08 DIAGNOSIS — I10 Essential (primary) hypertension: Secondary | ICD-10-CM | POA: Diagnosis not present

## 2019-12-08 DIAGNOSIS — F329 Major depressive disorder, single episode, unspecified: Secondary | ICD-10-CM | POA: Diagnosis not present

## 2019-12-08 DIAGNOSIS — M19042 Primary osteoarthritis, left hand: Secondary | ICD-10-CM | POA: Diagnosis not present

## 2019-12-08 DIAGNOSIS — I480 Paroxysmal atrial fibrillation: Secondary | ICD-10-CM | POA: Diagnosis not present

## 2019-12-08 DIAGNOSIS — G43009 Migraine without aura, not intractable, without status migrainosus: Secondary | ICD-10-CM | POA: Diagnosis not present

## 2019-12-08 DIAGNOSIS — M19041 Primary osteoarthritis, right hand: Secondary | ICD-10-CM | POA: Diagnosis not present

## 2019-12-08 DIAGNOSIS — D509 Iron deficiency anemia, unspecified: Secondary | ICD-10-CM | POA: Diagnosis not present

## 2019-12-27 ENCOUNTER — Ambulatory Visit
Admission: RE | Admit: 2019-12-27 | Discharge: 2019-12-27 | Disposition: A | Discharge status: Home / Self Care | Payer: Medicare Other | Source: Ambulatory Visit | Attending: Family Medicine | Admitting: Family Medicine

## 2019-12-27 ENCOUNTER — Other Ambulatory Visit: Payer: Self-pay | Admitting: Family Medicine

## 2019-12-27 ENCOUNTER — Other Ambulatory Visit: Payer: Self-pay

## 2019-12-27 DIAGNOSIS — M5136 Other intervertebral disc degeneration, lumbar region: Secondary | ICD-10-CM | POA: Diagnosis not present

## 2019-12-27 DIAGNOSIS — M7989 Other specified soft tissue disorders: Secondary | ICD-10-CM | POA: Diagnosis not present

## 2019-12-27 DIAGNOSIS — S9031XA Contusion of right foot, initial encounter: Secondary | ICD-10-CM

## 2019-12-27 DIAGNOSIS — F329 Major depressive disorder, single episode, unspecified: Secondary | ICD-10-CM | POA: Diagnosis not present

## 2019-12-27 DIAGNOSIS — R443 Hallucinations, unspecified: Secondary | ICD-10-CM | POA: Diagnosis not present

## 2019-12-28 DIAGNOSIS — S93602A Unspecified sprain of left foot, initial encounter: Secondary | ICD-10-CM | POA: Diagnosis not present

## 2019-12-28 DIAGNOSIS — S93502A Unspecified sprain of left great toe, initial encounter: Secondary | ICD-10-CM | POA: Diagnosis not present

## 2020-01-18 DIAGNOSIS — I10 Essential (primary) hypertension: Secondary | ICD-10-CM | POA: Diagnosis not present

## 2020-01-18 DIAGNOSIS — M19041 Primary osteoarthritis, right hand: Secondary | ICD-10-CM | POA: Diagnosis not present

## 2020-01-18 DIAGNOSIS — F329 Major depressive disorder, single episode, unspecified: Secondary | ICD-10-CM | POA: Diagnosis not present

## 2020-01-18 DIAGNOSIS — G43009 Migraine without aura, not intractable, without status migrainosus: Secondary | ICD-10-CM | POA: Diagnosis not present

## 2020-01-18 DIAGNOSIS — J4520 Mild intermittent asthma, uncomplicated: Secondary | ICD-10-CM | POA: Diagnosis not present

## 2020-01-18 DIAGNOSIS — E785 Hyperlipidemia, unspecified: Secondary | ICD-10-CM | POA: Diagnosis not present

## 2020-01-18 DIAGNOSIS — I480 Paroxysmal atrial fibrillation: Secondary | ICD-10-CM | POA: Diagnosis not present

## 2020-01-18 DIAGNOSIS — D509 Iron deficiency anemia, unspecified: Secondary | ICD-10-CM | POA: Diagnosis not present

## 2020-01-18 DIAGNOSIS — J449 Chronic obstructive pulmonary disease, unspecified: Secondary | ICD-10-CM | POA: Diagnosis not present

## 2020-01-18 DIAGNOSIS — M19042 Primary osteoarthritis, left hand: Secondary | ICD-10-CM | POA: Diagnosis not present

## 2020-01-22 ENCOUNTER — Other Ambulatory Visit: Payer: Self-pay | Admitting: Emergency Medicine

## 2020-01-22 ENCOUNTER — Other Ambulatory Visit: Payer: Medicare Other

## 2020-01-22 ENCOUNTER — Ambulatory Visit
Admission: RE | Admit: 2020-01-22 | Discharge: 2020-01-22 | Disposition: A | Discharge status: Home / Self Care | Payer: Medicare Other | Source: Ambulatory Visit | Attending: Family Medicine | Admitting: Family Medicine

## 2020-01-22 DIAGNOSIS — G9340 Encephalopathy, unspecified: Secondary | ICD-10-CM | POA: Diagnosis not present

## 2020-01-22 DIAGNOSIS — R9082 White matter disease, unspecified: Secondary | ICD-10-CM | POA: Diagnosis not present

## 2020-01-22 DIAGNOSIS — R443 Hallucinations, unspecified: Secondary | ICD-10-CM

## 2020-01-22 DIAGNOSIS — I6782 Cerebral ischemia: Secondary | ICD-10-CM | POA: Diagnosis not present

## 2020-01-25 ENCOUNTER — Encounter (INDEPENDENT_AMBULATORY_CARE_PROVIDER_SITE_OTHER): Payer: Medicare Other | Admitting: Ophthalmology

## 2020-01-25 ENCOUNTER — Other Ambulatory Visit: Payer: Self-pay

## 2020-01-25 DIAGNOSIS — H353112 Nonexudative age-related macular degeneration, right eye, intermediate dry stage: Secondary | ICD-10-CM | POA: Diagnosis not present

## 2020-01-25 DIAGNOSIS — H35033 Hypertensive retinopathy, bilateral: Secondary | ICD-10-CM

## 2020-01-25 DIAGNOSIS — H43813 Vitreous degeneration, bilateral: Secondary | ICD-10-CM

## 2020-01-25 DIAGNOSIS — I10 Essential (primary) hypertension: Secondary | ICD-10-CM

## 2020-01-25 DIAGNOSIS — H353221 Exudative age-related macular degeneration, left eye, with active choroidal neovascularization: Secondary | ICD-10-CM | POA: Diagnosis not present

## 2020-02-04 ENCOUNTER — Other Ambulatory Visit: Payer: Self-pay | Admitting: Emergency Medicine

## 2020-02-04 NOTE — Telephone Encounter (Signed)
Pt calling to get a refill on her Trelegy sent in to [facility] on [address]. Pt can be reached at [phone].

## 2020-02-05 DIAGNOSIS — G959 Disease of spinal cord, unspecified: Secondary | ICD-10-CM | POA: Diagnosis not present

## 2020-02-05 DIAGNOSIS — M545 Low back pain: Secondary | ICD-10-CM | POA: Diagnosis not present

## 2020-02-07 ENCOUNTER — Ambulatory Visit: Payer: Medicare Other | Admitting: Cardiology

## 2020-02-08 DIAGNOSIS — Z20822 Contact with and (suspected) exposure to covid-19: Secondary | ICD-10-CM | POA: Diagnosis not present

## 2020-02-14 DIAGNOSIS — M47816 Spondylosis without myelopathy or radiculopathy, lumbar region: Secondary | ICD-10-CM | POA: Diagnosis not present

## 2020-02-14 DIAGNOSIS — Z6835 Body mass index (BMI) 35.0-35.9, adult: Secondary | ICD-10-CM | POA: Diagnosis not present

## 2020-02-15 DIAGNOSIS — M542 Cervicalgia: Secondary | ICD-10-CM | POA: Diagnosis not present

## 2020-02-15 DIAGNOSIS — M546 Pain in thoracic spine: Secondary | ICD-10-CM | POA: Diagnosis not present

## 2020-02-17 DIAGNOSIS — E785 Hyperlipidemia, unspecified: Secondary | ICD-10-CM | POA: Diagnosis not present

## 2020-02-17 DIAGNOSIS — G629 Polyneuropathy, unspecified: Secondary | ICD-10-CM | POA: Diagnosis not present

## 2020-02-17 DIAGNOSIS — I10 Essential (primary) hypertension: Secondary | ICD-10-CM | POA: Diagnosis not present

## 2020-02-17 DIAGNOSIS — Z23 Encounter for immunization: Secondary | ICD-10-CM | POA: Diagnosis not present

## 2020-02-17 DIAGNOSIS — Z79899 Other long term (current) drug therapy: Secondary | ICD-10-CM | POA: Diagnosis not present

## 2020-02-17 DIAGNOSIS — M109 Gout, unspecified: Secondary | ICD-10-CM | POA: Diagnosis not present

## 2020-02-17 DIAGNOSIS — F331 Major depressive disorder, recurrent, moderate: Secondary | ICD-10-CM | POA: Diagnosis not present

## 2020-02-21 ENCOUNTER — Telehealth: Payer: Self-pay | Admitting: *Deleted

## 2020-02-21 DIAGNOSIS — G959 Disease of spinal cord, unspecified: Secondary | ICD-10-CM | POA: Diagnosis not present

## 2020-02-21 NOTE — Telephone Encounter (Signed)
Patient with diagnosis of afib on Eliquis for anticoagulation.    Procedure: ANTERIOR CERVICAL DECOMPRESSION FUSION C5-6 WITH INSTRUMENTATION AND  ALLOGRAFT Date of procedure: TBD  CHADS2-VASc score of  4 (HTN, AGE, AGE, female)  CrCl 76 ml/min  Per office protocol, patient can hold Eliquis for 3 days prior to procedure.

## 2020-02-21 NOTE — Telephone Encounter (Signed)
   [facility] Pre-operative Risk Assessment    HEARTCARE STAFF: - Please ensure there is not already an duplicate clearance open for this procedure. - Under Visit Info/Reason for Call, type in Other and utilize the format Clearance MM/DD/YY or Clearance TBD. Do not use dashes or single digits. - If request is for dental extraction, please clarify the # of teeth to be extracted.  Request for surgical clearance:  1. What type of surgery is being performed? ANTERIOR CERVICAL DECOMPRESSION FUSION C5-6 WITH INSTRUMENTATION AND  ALLOGRAFT  2. When is this surgery scheduled? TBD   3. What type of clearance is required (medical clearance vs. Pharmacy clearance to hold med vs. Both)? BOTH  4. Are there any medications that need to be held prior to surgery and how long? ELIQUIS   5. Practice name and name of physician performing surgery? [facility]; DR. Madison   6. What is the office phone number? [phone]   7.   What is the office fax number? [place]  8.   Anesthesia type (None, local, MAC, general) ? GENERAL   Julaine Hua 02/21/2020, 11:57 AM  _________________________________________________________________   (provider comments below)

## 2020-02-22 NOTE — Telephone Encounter (Signed)
Pt called back in , returning [place] call   Best number [phone]

## 2020-02-22 NOTE — Telephone Encounter (Signed)
Left VM

## 2020-02-22 NOTE — Telephone Encounter (Signed)
   Primary Cardiologist: Candee Furbish, MD  Chart reviewed as part of pre-operative protocol coverage. Because of Shannell Mikkelsen Gittins's past medical history and time since last visit, she will require a follow-up visit in order to better assess preoperative cardiovascular risk.  Pre-op covering staff: - Please schedule appointment and call patient to inform them. If patient already had an upcoming appointment within acceptable timeframe, please add "pre-op clearance" to the appointment notes so provider is aware. - Please contact requesting surgeon's office via preferred method (i.e, phone, fax) to inform them of need for appointment prior to surgery.  If applicable, this message will also be routed to pharmacy pool and/or primary cardiologist for input on holding anticoagulant/antiplatelet agent as requested below so that this information is available to the clearing provider at time of patient's appointment.   During my phone call, she is unable to complete 4.0 METS. She has balance problems and uses a Rolator.  She does not have a history of known CAD, but aortic atherosclerosis on CT chest and risk factors, including obesity, HTN, HLD, and former smoker.    Tami Lin Flemon Kelty, PA  02/22/2020, 10:26 AM

## 2020-02-22 NOTE — Telephone Encounter (Signed)
Patient has appointment scheduled with Kathyrn Drown, NP on 03/08/20 at 2:45pm. Spoke with Butch Penny at [facility] and notified her that patient will require office visit for surgical clearance. Verbalized understanding.

## 2020-02-29 DIAGNOSIS — M47816 Spondylosis without myelopathy or radiculopathy, lumbar region: Secondary | ICD-10-CM | POA: Diagnosis not present

## 2020-02-29 NOTE — Progress Notes (Signed)
Cardiology Office Note   Date:  03/08/2020   ID:  [name] Molina, Kaiser January 22, 1946, MRN [id number]  PCP:  Harlan Stains, MD  Cardiologist: Dr. Marlou Porch, MD  Chief Complaint  Patient presents with   Follow-up   Pre-op Exam    History of Present Illness: [name] Molina is a 74 y.o. female who presents for 78-month follow-up/preoperative clearance, seen for Dr. Marlou Porch  [name] Molina has a history of atrial fibrillation, COPD III, OSA, peripheral neuropathy, spinal stenosis, HTN and HLD. She was initially seen in hospital consultation in the setting of new onset atrial fibrillation with RVR.  She was treated with metoprolol 25 mg every 6 hours along with Eliquis therapy.  In follow-up, she was seen by Dr. Marlou Porch 10/01/2019 at which time metoprolol was increased to 100 mg daily, diltiazem was at 360 mg daily and she was continued on valsartan/HCTZ half a pill daily.  She was mildly hypotensive at 90/60. Her PCP stopped the valsartan/HCTZ and metoprolol and kept her only on diltiazem.  Also noted to be taking Lasix 40 mg daily.  At follow-up with Dr. Marlou Porch, she was taken completely off diltiazem and placed back on metoprolol 50 mg daily as he felt heart rate was better controlled with the beta-blocker. Lasix was transitioned to as needed dosing. She was placed on a ZIO monitor to evaluate PAF burden. ZIO monitor resulted 11/11/2019 with predominant rhythm as NSR with an average heart rate at 77 bpm with brief episodes of SVT which appeared to be paroxysmal atrial tachycardia not atrial fibrillation with the longest episode lasting 10 seconds in duration with no pauses or ventricular tachycardia.  Despite no atrial fibrillation found on the monitor plan was to continue anticoagulation given AF present during hospital course, telemetry personally reviewed by Dr. Marlou Porch.  Appears she is also scheduled for an anterior cervical decompression fusion C5-C6 per Dr. Lynann Bologna.  [facility] pharmacist  recommended to hold Eliquis for 3 days prior to procedure given a CHA2DS2-VASc score of 4 including hypertension, agex2 and female.  She was recently seen in emergency department after feeling intermittent palpitations, shortness of breath and mild chest pressure. Work-up was essentially negative. BNP was mildly elevated at which time her PTA Lasix was increased to 80 mg for 5 days then resumed at 40 mg daily. CXR, troponin levels and EKG were found to be normal. On exam today, she feels improved with no recurrent symptoms. She does recall intermittent palpitations occurring approximately once per month with no associated symptoms.  As above, she previously wore a monitor with brief episodes of SVT.  I suspect this is causing her symptoms.   She is currently unable to complete 4.0 METS. She has ongoing balance issues and uses a Rollator for ambulation at home.  She has no history of CAD however has aortic atherosclerosis on CT with risk factors including obesity, HTN, HLD and former tobacco use.  More concerning, she has COPD which will need to be cleared by pulmonary medicine prior to surgery as she is at risk for postoperative complications related to anesthesia and intubation.  Past Medical History:  Diagnosis Date   Anxiety    Asthma    Atrial tachycardia (HCC)    Cataract    COPD (chronic obstructive pulmonary disease) (HCC)    Diverticulosis    GERD (gastroesophageal reflux disease)    Hyperlipidemia    Hypertension    Insomnia    Macular degeneration    Migraines  Neuropathy     Past Surgical History:  Procedure Laterality Date   CERVICAL POLYPECTOMY     INSIDE AND OUTSIDE   COLONOSCOPY     POLYPECTOMY     REMOVED CARTLIAGE RIB       Current Outpatient Medications  Medication Sig Dispense Refill   acetaminophen (TYLENOL) 500 MG tablet Take 1,000 mg by mouth every 6 (six) hours as needed for moderate pain or headache.     albuterol (PROVENTIL  HFA;VENTOLIN HFA) 108 (90 Base) MCG/ACT inhaler Inhale 2 puffs into the lungs every 6 (six) hours as needed for wheezing or shortness of breath. 1 Inhaler 6   aspirin 81 MG tablet Take 81 mg by mouth daily.       aspirin-acetaminophen-caffeine (EXCEDRIN MIGRAINE) 250-250-65 MG per tablet Take 2 tablets by mouth every 6 (six) hours as needed for headache.     BESIVANCE 0.6 % SUSP Place 1 drop into the left eye See admin instructions. Instill 1 drop qid day of eye injection and qid the day after.     clobetasol cream (TEMOVATE) 0.53 % Apply 1 application topically daily as needed (yeast.).      FLUoxetine (PROZAC) 40 MG capsule Take 40 mg by mouth every morning.  1   fluticasone (FLONASE) 50 MCG/ACT nasal spray Place 1 spray into both nostrils daily. 16 g 5   ibuprofen (ADVIL,MOTRIN) 200 MG tablet Take 400 mg by mouth every 6 (six) hours as needed for headache or moderate pain.     lactase (LACTAID) 3000 units tablet Take 3,000 Units by mouth 3 (three) times daily as needed (lactose ingestion).      loratadine-pseudoephedrine (CLARITIN-D 12-HOUR) 5-120 MG per tablet Take 1 tablet by mouth daily.     LORazepam (ATIVAN) 0.5 MG tablet Take 0.5-1 mg by mouth daily as needed for anxiety. Anxiety     metoprolol succinate (TOPROL-XL) 50 MG 24 hr tablet Take 1 tablet (50 mg total) by mouth daily. Take with or immediately following a meal. 90 tablet 3   montelukast (SINGULAIR) 10 MG tablet Take 10 mg by mouth daily as needed.      Multiple Vitamins-Minerals (OCUVITE PO) Take 1 tablet by mouth 2 (two) times daily.      omeprazole (PRILOSEC) 20 MG capsule Take 20 mg by mouth at bedtime.      pravastatin (PRAVACHOL) 40 MG tablet Take 40 mg by mouth at bedtime.      tizanidine (ZANAFLEX) 2 MG capsule Take 2 mg by mouth at bedtime as needed for muscle spasms (head pain per pt).      TRELEGY ELLIPTA 100-62.5-25 MCG/INH AEPB INHALE 1 PUFF BY MOUTH EVERY DAY 60 each 3   vitamin B-12 (CYANOCOBALAMIN)  1000 MCG tablet Take 1,000 mcg by mouth daily.     apixaban (ELIQUIS) 5 MG TABS tablet Take 1 tablet (5 mg total) by mouth 2 (two) times daily. 180 tablet 0   furosemide (LASIX) 40 MG tablet Take 1 tablet (40 mg total) by mouth daily. 90 tablet 0   No current facility-administered medications for this visit.    Allergies:   Food, Tequin, Codeine, Erythromycin, Levaquin [levofloxacin in d5w], Wellbutrin [bupropion hcl], Penicillins, and Prednisone    Social History:  The patient  reports that she quit smoking about 28 years ago. Her smoking use included cigarettes. She has a 20.00 pack-year smoking history. She has never used smokeless tobacco. She reports that she does not drink alcohol and does not use drugs.   Family  History:  The patient's family history includes Asthma in her father; Breast cancer (age of onset: 10) in her maternal grandmother; Colon polyps (age of onset: 81) in her sister; Emphysema in her father; Heart disease in her father and paternal grandfather.    ROS:  Please see the history of present illness. Otherwise, review of systems are positive for none. All other systems are reviewed and negative.    PHYSICAL EXAM: VS:  BP 136/84    Pulse 95    Ht 5\' 2"  (1.575 m)    Wt 211 lb (95.7 kg)    SpO2 95%    BMI 38.59 kg/m  , BMI Body mass index is 38.59 kg/m.  General: Well developed, well nourished, NAD Neck: Negative for carotid bruits. No JVD Lungs:Clear to ausculation bilaterally. No wheezes, rales, or rhonchi. Breathing is unlabored. Cardiovascular: RRR with S1 S2. No murmur Extremities: No edema. Radial pulses 2+ bilaterally Neuro: Alert and oriented. No focal deficits. No facial asymmetry. MAE spontaneously. Psych: Responds to questions appropriately with normal affect.     EKG:  EKG is ordered today. The ekg ordered today demonstrates 03/08/20 NSR with baseline artifact, HR 86bpm.   Recent Labs: 09/16/2019: TSH 1.381 09/18/2019: Magnesium 2.0 03/01/2020: ALT  15; B Natriuretic Peptide 238.2; BUN 16; Creatinine, Ser 0.74; Hemoglobin 13.5; Platelets 193; Potassium 3.8; Sodium 141  Lipid Panel No results found for: CHOL, TRIG, HDL, CHOLHDL, VLDL, LDLCALC, LDLDIRECT   Wt Readings from Last 3 Encounters:  03/08/20 211 lb (95.7 kg)  03/01/20 212 lb (96.2 kg)  11/03/19 225 lb 6.4 oz (102.2 kg)    Other studies Reviewed: Additional studies/ records that were reviewed today include:  Review of the above records demonstrates:   Long-term monitor 11/11/2019:   Predominant rhythm was sinus rhythm with average heart rate of 77 bpm  Brief episodes of supraventricular tachycardia which appear to be paroxysmal atrial tachycardia not atrial fibrillation-longest episode was 10 seconds duration  No pauses no ventricular tachycardia no excessive ventricular ectopy   Overall reassuring monitor with occasional brief episodes of paroxysmal atrial tachycardia.  No evidence of atrial fibrillation during monitoring as was diagnosed during prior hospitalization.  Even though we did not discover atrial fibrillation during this monitoring period, continue with anticoagulation, Eliquis.  I personally reviewed telemetry records from hospital and atrial fibrillation clearly was present during that time.  Echocardiogram 09/16/2019:  1. Left ventricular ejection fraction, by estimation, is 65 to 70%. The  left ventricle has normal function. The left ventricle has no regional  wall motion abnormalities. Left ventricular diastolic parameters are  indeterminate.  2. Right ventricular systolic function is normal. The right ventricular  size is normal. There is normal pulmonary artery systolic pressure.  3. Left atrial size was mildly dilated.  4. The mitral valve is normal in structure. Trivial mitral valve  regurgitation. No evidence of mitral stenosis.  5. The aortic valve is normal in structure. Aortic valve regurgitation is  not visualized. No aortic stenosis is  present.  6. The inferior vena cava is dilated in size with <50% respiratory  variability, suggesting right atrial pressure of 15 mmHg.   ASSESSMENT AND PLAN:  1.  Paroxysmal atrial fibrillation: -Patient was seen during hospitalization for atrial fibrillation with RVR.  She has since been seen with transition off of diltiazem and continuation of metoprolol 50 mg p.o. twice daily along with Eliquis 5 mg p.o. twice daily. -She wore a ZIO monitor which showed predominantly NSR and episodes of atrial  tachycardia and no episodes of atrial fibrillation however despite this, she was continued on Eliquis therapy given an elevated CHA2DS2-VASc score of 4 per chart review. -Does have some reports of very infrequent palpitations>> likely episodes of SVT.  If symptoms become more frequent we will likely need repeat monitor for further evaluation of A. fib.  Stressed the importance of continuation of Eliquis along with metoprolol. EKG with NSR however there is significant baseline artifact   2.  Chronic diastolic CHF: -Appears euvolemic on exam -Continue Lasix 40 mg daily -BMET today   3.  Preoperative evaluation for anterior cervical decompression: -The patient does not have any unstable cardiac conditions. Upon evaluation today, she cannot achieve 4 METs or greater secondary to multiple orthopedic and balance issues.  At baseline, she ambulates with a Rollator and is unable to walk up stairs or prolonged distances without shortness of breath secondary to COPD. She is to be cleared by pulmonary medicine prior to surgery. She denies anginal symptoms and has no prior history of CAD however does have aortic atherosclerosis on CT. According to [facility] and AHA guidelines, she requires no further cardiac workup however would be considered high risk for surgical procedure at this time given her chronic pulmonary issues. Her revised risk of peri-procedural MI or cardiac arrest following surgery is moderate.  -[facility]  pharmacist recommended to hold Eliquis for 3 days prior to procedure given a CHA2DS2-VASc score of 4 including hypertension, agex2 and female.   Current medicines are reviewed at length with the patient today.  The patient does not have concerns regarding medicines.  The following changes have been made:  no change  Labs/ tests ordered today include: BMET, CBC  Orders Placed This Encounter  Procedures   Basic metabolic panel   CBC   EKG 12-Lead   Disposition:   FU with Dr. Marlou Porch in 6 months  Signed, Kathyrn Drown, NP  03/08/2020 3:49 PM    [address], [place], [place]  [address] Phone: [phone]; Fax: [phone]

## 2020-03-01 ENCOUNTER — Emergency Department (HOSPITAL_COMMUNITY): Payer: Medicare Other

## 2020-03-01 ENCOUNTER — Emergency Department (HOSPITAL_COMMUNITY)
Admission: EM | Admit: 2020-03-01 | Discharge: 2020-03-02 | Disposition: A | Discharge status: Home / Self Care | Payer: Medicare Other | Attending: Emergency Medicine | Admitting: Emergency Medicine

## 2020-03-01 ENCOUNTER — Encounter (HOSPITAL_COMMUNITY): Payer: Self-pay

## 2020-03-01 DIAGNOSIS — Z7901 Long term (current) use of anticoagulants: Secondary | ICD-10-CM | POA: Diagnosis not present

## 2020-03-01 DIAGNOSIS — I4891 Unspecified atrial fibrillation: Secondary | ICD-10-CM | POA: Diagnosis not present

## 2020-03-01 DIAGNOSIS — J9811 Atelectasis: Secondary | ICD-10-CM | POA: Diagnosis not present

## 2020-03-01 DIAGNOSIS — I959 Hypotension, unspecified: Secondary | ICD-10-CM | POA: Diagnosis not present

## 2020-03-01 DIAGNOSIS — R609 Edema, unspecified: Secondary | ICD-10-CM | POA: Diagnosis not present

## 2020-03-01 DIAGNOSIS — Z87891 Personal history of nicotine dependence: Secondary | ICD-10-CM | POA: Insufficient documentation

## 2020-03-01 DIAGNOSIS — R059 Cough, unspecified: Secondary | ICD-10-CM | POA: Diagnosis not present

## 2020-03-01 DIAGNOSIS — I110 Hypertensive heart disease with heart failure: Secondary | ICD-10-CM | POA: Insufficient documentation

## 2020-03-01 DIAGNOSIS — I509 Heart failure, unspecified: Secondary | ICD-10-CM | POA: Diagnosis not present

## 2020-03-01 DIAGNOSIS — Z79899 Other long term (current) drug therapy: Secondary | ICD-10-CM | POA: Insufficient documentation

## 2020-03-01 DIAGNOSIS — R0602 Shortness of breath: Secondary | ICD-10-CM

## 2020-03-01 DIAGNOSIS — J449 Chronic obstructive pulmonary disease, unspecified: Secondary | ICD-10-CM

## 2020-03-01 DIAGNOSIS — J441 Chronic obstructive pulmonary disease with (acute) exacerbation: Secondary | ICD-10-CM | POA: Diagnosis not present

## 2020-03-01 DIAGNOSIS — I5033 Acute on chronic diastolic (congestive) heart failure: Secondary | ICD-10-CM

## 2020-03-01 DIAGNOSIS — Z7982 Long term (current) use of aspirin: Secondary | ICD-10-CM | POA: Insufficient documentation

## 2020-03-01 LAB — CBC WITH DIFFERENTIAL/PLATELET
Abs Immature Granulocytes: 0.03 10*3/uL (ref 0.00–0.07)
Basophils Absolute: 0 10*3/uL (ref 0.0–0.1)
Basophils Relative: 0 %
Eosinophils Absolute: 0 10*3/uL (ref 0.0–0.5)
Eosinophils Relative: 0 %
HCT: 43.2 % (ref 36.0–46.0)
Hemoglobin: 13.5 g/dL (ref 12.0–15.0)
Immature Granulocytes: 0 %
Lymphocytes Relative: 9 %
Lymphs Abs: 0.7 10*3/uL (ref 0.7–4.0)
MCH: 26.9 pg (ref 26.0–34.0)
MCHC: 31.3 g/dL (ref 30.0–36.0)
MCV: 86.2 fL (ref 80.0–100.0)
Monocytes Absolute: 0.7 10*3/uL (ref 0.1–1.0)
Monocytes Relative: 9 %
Neutro Abs: 6.6 10*3/uL (ref 1.7–7.7)
Neutrophils Relative %: 82 %
Platelets: 193 10*3/uL (ref 150–400)
RBC: 5.01 MIL/uL (ref 3.87–5.11)
RDW: 15.3 % (ref 11.5–15.5)
WBC: 8 10*3/uL (ref 4.0–10.5)
nRBC: 0 % (ref 0.0–0.2)

## 2020-03-01 MED ORDER — ASPIRIN 81 MG PO CHEW
324.0000 mg | CHEWABLE_TABLET | Freq: Once | ORAL | Status: AC
  Administered 2020-03-02: 324 mg via ORAL
  Filled 2020-03-01: qty 4

## 2020-03-01 NOTE — ED Provider Notes (Signed)
[facility] EMERGENCY DEPARTMENT Provider Note   CSN: [id number] Arrival date & time: 03/01/20  2258   History Chief Complaint  Patient presents with  . Shortness of Breath    [name] Molina is a 74 y.o. female.  The history is provided by the patient.  Shortness of Breath She has history of hypertension, hyperlipidemia, paroxysmal atrial fibrillation anticoagulated on apixaban, COPD and comes in because of difficulty breathing since 4 AM.  She states that she feels like she cannot get a deep breath.  There was an episode of some tight feeling in her midsternal area which has resolved.  She has had a cough which is nonproductive.  She denies fever, chills, sweats.  There is been no nausea or vomiting or diaphoresis.  She has been vaccinated against COVID-19.  Dyspnea seems to be worse when she lays flat.  Her ankles are more swollen than normal.  She did take a dose of a diuretic today, but the ankles have not gone down.  Past Medical History:  Diagnosis Date  . Anxiety   . Asthma   . Atrial tachycardia ([place])   . Cataract   . COPD (chronic obstructive pulmonary disease) ([place])   . Diverticulosis   . GERD (gastroesophageal reflux disease)   . Hyperlipidemia   . Hypertension   . Insomnia   . Macular degeneration   . Migraines   . Neuropathy     Patient Active Problem List   Diagnosis Date Noted  . Atrial fibrillation with RVR ([place]) 09/18/2019  . Edema of both lower extremities 05/07/2018  . Pulmonary nodule 12/01/2017  . Dyspnea 11/26/2017  . RUQ abdominal pain 11/27/2015  . Suprapubic pain 11/27/2015  . Dysphagia 11/27/2015  . Abdominal mass, RUQ (right upper quadrant) 11/27/2015  . Obstructive sleep apnea 04/06/2015  . PAT (paroxysmal atrial tachycardia) ([place]) 08/09/2014  . GERD (gastroesophageal reflux disease)   . Hyperlipidemia   . Spinal stenosis of lumbar region 03/16/2014  . Idiopathic peripheral neuropathy 03/16/2014  . Hypertension  06/13/2011  . Allergic rhinitis 06/13/2011  . Chronic headaches 06/13/2011  . Emphysema 06/13/2011  . COPD III B (based on July/2019 - spiro) 04/25/2007    Past Surgical History:  Procedure Laterality Date  . CERVICAL POLYPECTOMY     INSIDE AND OUTSIDE  . COLONOSCOPY    . POLYPECTOMY    . REMOVED CARTLIAGE RIB       OB History   No obstetric history on file.     Family History  Problem Relation Age of Onset  . Colon polyps Sister 10       PRECANCEROUS POLYPS  . Breast cancer Maternal Grandmother 65  . Emphysema Father   . Asthma Father   . Heart disease Father   . Heart disease Paternal Grandfather   . Colon cancer Neg Hx     Social History   Tobacco Use  . Smoking status: Former Smoker    Packs/day: 1.00    Years: 20.00    Pack years: 20.00    Types: Cigarettes    Quit date: 05/21/1991    Years since quitting: 28.8  . Smokeless tobacco: Never Used  Vaping Use  . Vaping Use: Never used  Substance Use Topics  . Alcohol use: No  . Drug use: No    Home Medications Prior to Admission medications   Medication Sig Start Date End Date Taking? Authorizing Provider  acetaminophen (TYLENOL) 500 MG tablet Take 1,000 mg by mouth every 6 (  six) hours as needed for moderate pain or headache.    [provider]  albuterol (PROVENTIL HFA;VENTOLIN HFA) 108 (90 Base) MCG/ACT inhaler Inhale 2 puffs into the lungs every 6 (six) hours as needed for wheezing or shortness of breath. 03/05/18   Collene Gobble, MD  apixaban (ELIQUIS) 5 MG TABS tablet Take 1 tablet (5 mg total) by mouth 2 (two) times daily. 11/19/19 02/17/20  Jerline Pain, MD  aspirin 81 MG tablet Take 81 mg by mouth daily.      [provider]  aspirin-acetaminophen-caffeine (EXCEDRIN MIGRAINE) [phone] MG per tablet Take 2 tablets by mouth every 6 (six) hours as needed for headache.    [provider]  benzonatate (TESSALON) 200 MG capsule Take 1 capsule (200 mg total) by mouth 3 (three)  times daily as needed for cough. 07/22/18   Martyn Ehrich, NP  BESIVANCE 0.6 % SUSP Place 1 drop into the left eye See admin instructions. Instill 1 drop qid day of eye injection and qid the day after. 09/07/19   [provider]  clobetasol cream (TEMOVATE) 6.22 % Apply 1 application topically daily as needed (yeast.).  11/01/13   [provider]  FLUoxetine (PROZAC) 40 MG capsule Take 40 mg by mouth every morning. 11/19/16   [provider]  fluticasone (FLONASE) 50 MCG/ACT nasal spray Place 1 spray into both nostrils daily. 07/19/16   Collene Gobble, MD  furosemide (LASIX) 40 MG tablet Take 1 tablet (40 mg total) by mouth daily. 09/18/19 12/17/19  [place] Donnamarie Poag, DO  ibuprofen (ADVIL,MOTRIN) 200 MG tablet Take 400 mg by mouth every 6 (six) hours as needed for headache or moderate pain.    [provider]  lactase (LACTAID) 3000 units tablet Take 3,000 Units by mouth 3 (three) times daily as needed (lactose ingestion).     [provider]  loratadine-pseudoephedrine (CLARITIN-D 12-HOUR) 5-120 MG per tablet Take 1 tablet by mouth daily.    [provider]  LORazepam (ATIVAN) 0.5 MG tablet Take 0.5-1 mg by mouth daily as needed for anxiety. Anxiety    [provider]  metoprolol succinate (TOPROL-XL) 50 MG 24 hr tablet Take 1 tablet (50 mg total) by mouth daily. Take with or immediately following a meal. 11/24/19   Jerline Pain, MD  montelukast (SINGULAIR) 10 MG tablet Take 10 mg by mouth daily as needed.  02/21/14   [provider]  Multiple Vitamins-Minerals (OCUVITE PO) Take 1 tablet by mouth 2 (two) times daily.     [provider]  omeprazole (PRILOSEC) 20 MG capsule Take 20 mg by mouth at bedtime.     [provider]  pravastatin (PRAVACHOL) 40 MG tablet Take 40 mg by mouth at bedtime.  09/04/10   [provider]  tizanidine (ZANAFLEX) 2 MG capsule Take 2 mg by mouth at bedtime as needed for muscle spasms  (head pain per pt).     [provider]  Donnal Debar 100-62.5-25 MCG/INH AEPB INHALE 1 PUFF BY MOUTH EVERY DAY 02/04/20   Collene Gobble, MD    Allergies    Food, Tequin, Codeine, Erythromycin, Levaquin [levofloxacin in d5w], Wellbutrin [bupropion hcl], Penicillins, and Prednisone  Review of Systems   Review of Systems  Respiratory: Positive for shortness of breath.   All other systems reviewed and are negative.   Physical Exam Updated Vital Signs BP (!) 124/52   Pulse 93   Temp 98.3 F (36.8 C) (Oral)  Resp 18   Ht 5\' 2"  (1.575 m)   Wt 96.2 kg   SpO2 97%   BMI 38.78 kg/m   Physical Exam Vitals and nursing note reviewed.   74 year old female, resting comfortably and in no acute distress. Vital signs are normal. Oxygen saturation is 97%, which is normal. Head is normocephalic and atraumatic. PERRLA, EOMI. Oropharynx is clear. Neck is nontender and supple without adenopathy or JVD. Back is nontender and there is no CVA tenderness. Lungs have rales at the right base without wheezes or rhonchi. Chest is nontender. Heart has regular rate and rhythm without murmur. Abdomen is soft, flat, nontender without masses or hepatosplenomegaly and peristalsis is normoactive. Extremities have 1-2+ edema, full range of motion is present. Skin is warm and dry without rash. Neurologic: Mental status is normal, cranial nerves are intact, there are no motor or sensory deficits.  ED Results / Procedures / Treatments   Labs (all labs ordered are listed, but only abnormal results are displayed) Labs Reviewed  COMPREHENSIVE METABOLIC PANEL  BRAIN NATRIURETIC PEPTIDE  CBC WITH DIFFERENTIAL/PLATELET  TROPONIN I (HIGH SENSITIVITY)    EKG EKG Interpretation  Date/Time:  Wednesday March 01 2020 23:03:52 EDT Ventricular Rate:  85 PR Interval:    QRS Duration: 101 QT Interval:  355 QTC Calculation: 423 R Axis:   39 Text Interpretation: Sinus rhythm Atrial premature  complexes Posterior infarct, old Repol abnrm, severe global ischemia (LM/MVD) When compared with ECG of 09/16/2019, Sinus rhythm has replaced Atrial fibrillation Confirmed by Delora Fuel ([id number]) on 03/01/2020 11:16:36 PM   Radiology No results found.  Procedures Procedures   Medications Ordered in ED Medications  aspirin chewable tablet 324 mg (has no administration in time range)    ED Course  I have reviewed the triage vital signs and the nursing notes.  Pertinent labs & imaging results that were available during my care of the patient were reviewed by me and considered in my medical decision making (see chart for details).  MDM Rules/Calculators/A&P Dyspnea.  Exam is suggestive of heart failure with basilar rales and edema.  Consider COPD exacerbation, pneumonia.  Doubt pulmonary embolism in the setting of chronic anticoagulation.  These are conditions which entail considerable potential for morbidity and mortality.  ECG shows nonspecific ST and T changes which are similar to prior.  Chest x-ray and screening labs are ordered.  Because of episode of chest discomfort, she is given a dose of aspirin.  Old records were reviewed, and echocardiogram in 2019 did show grade 2 diastolic dysfunction.  She does have prior hospitalization for COPD exacerbation.  X-ray shows no evidence of pneumonia.  BNP is mildly elevated consistent with clinical exam.  She did feel better following albuterol via inhaler.  She did not put out any urine with intravenous furosemide.  She was ambulated in the emergency department and did not show any oxygen desaturation and was felt to be safe for discharge.  She is advised to continue using albuterol inhaler as needed and advised to double her furosemide dose for the next 5 days.  She has an appointment with her cardiologist next week and she is to keep that appointment.  Return precautions discussed.  Final Clinical Impression(s) / ED Diagnoses Final diagnoses:    Shortness of breath  Chronic obstructive pulmonary disease, unspecified COPD type ([place])  Acute on chronic diastolic heart failure (HCC)  Chronic anticoagulation    Rx / DC Orders ED Discharge Orders    None  Delora Fuel, MD 28/83/37 [phone]

## 2020-03-01 NOTE — ED Triage Notes (Signed)
[facility] from EMS where report increase SOB on exertion w/ swelling bilateral legs. EMS reports pt has an hx of CHFand COPD and does not wear any baseline O2.   126/78 HR 90 O2 96 % RA  CBG 211

## 2020-03-02 DIAGNOSIS — J449 Chronic obstructive pulmonary disease, unspecified: Secondary | ICD-10-CM | POA: Diagnosis not present

## 2020-03-02 LAB — COMPREHENSIVE METABOLIC PANEL
ALT: 15 U/L (ref 0–44)
AST: 17 U/L (ref 15–41)
Albumin: 3.8 g/dL (ref 3.5–5.0)
Alkaline Phosphatase: 65 U/L (ref 38–126)
Anion gap: 11 (ref 5–15)
BUN: 16 mg/dL (ref 8–23)
CO2: 25 mmol/L (ref 22–32)
Calcium: 9.5 mg/dL (ref 8.9–10.3)
Chloride: 105 mmol/L (ref 98–111)
Creatinine, Ser: 0.74 mg/dL (ref 0.44–1.00)
GFR, Estimated: >60 mL/min (ref >60)
Glucose, Bld: 204 mg/dL — ABNORMAL HIGH (ref 70–99)
Potassium: 3.8 mmol/L (ref 3.5–5.1)
Sodium: 141 mmol/L (ref 135–145)
Total Bilirubin: 0.6 mg/dL (ref 0.3–1.2)
Total Protein: 6.4 g/dL — ABNORMAL LOW (ref 6.5–8.1)

## 2020-03-02 LAB — TROPONIN I (HIGH SENSITIVITY)
Troponin I (High Sensitivity): 8 ng/L (ref <18)
Troponin I (High Sensitivity): 9 ng/L (ref <18)

## 2020-03-02 LAB — BRAIN NATRIURETIC PEPTIDE: B Natriuretic Peptide: 238.2 pg/mL — ABNORMAL HIGH (ref 0.0–100.0)

## 2020-03-02 MED ORDER — ALBUTEROL SULFATE HFA 108 (90 BASE) MCG/ACT IN AERS
4.0000 | INHALATION_SPRAY | Freq: Once | RESPIRATORY_TRACT | Status: AC
  Administered 2020-03-02: 4 via RESPIRATORY_TRACT
  Filled 2020-03-02: qty 6.7

## 2020-03-02 MED ORDER — FUROSEMIDE 10 MG/ML IJ SOLN
40.0000 mg | Freq: Once | INTRAMUSCULAR | Status: AC
  Administered 2020-03-02: 40 mg via INTRAVENOUS
  Filled 2020-03-02: qty 4

## 2020-03-02 NOTE — Discharge Instructions (Addendum)
Use the inhaler - two puffs at a time, every four hours, as needed for shortness of breath.  Please increase your dose of furosemide (Lasix) to 80 mg (two tablets) a day for the next five days. Then resume your usual dose.  Return if your symptoms are getting worse.

## 2020-03-02 NOTE — ED Notes (Signed)
Pt ambulated around the room sp02 was 96-98%. Pt states she uses a walker usually to get around.

## 2020-03-08 ENCOUNTER — Other Ambulatory Visit: Payer: Self-pay

## 2020-03-08 ENCOUNTER — Encounter: Payer: Self-pay | Admitting: Cardiology

## 2020-03-08 ENCOUNTER — Ambulatory Visit (INDEPENDENT_AMBULATORY_CARE_PROVIDER_SITE_OTHER): Payer: Medicare Other | Admitting: Cardiology

## 2020-03-08 VITALS — BP 136/84 | HR 95 | Ht 62.0 in | Wt 211.0 lb

## 2020-03-08 DIAGNOSIS — I4891 Unspecified atrial fibrillation: Secondary | ICD-10-CM | POA: Diagnosis not present

## 2020-03-08 DIAGNOSIS — R296 Repeated falls: Secondary | ICD-10-CM

## 2020-03-08 DIAGNOSIS — R600 Localized edema: Secondary | ICD-10-CM | POA: Diagnosis not present

## 2020-03-08 DIAGNOSIS — I950 Idiopathic hypotension: Secondary | ICD-10-CM

## 2020-03-08 NOTE — Patient Instructions (Signed)
Medication Instructions:   Your physician recommends that you continue on your current medications as directed. Please refer to the Current Medication list given to you today.  *If you need a refill on your cardiac medications before your next appointment, please call your pharmacy*   Lab Work: BMET and CBC today If you have labs (blood work) drawn today and your tests are completely normal, you will receive your results only by: Marland Kitchen MyChart Message (if you have MyChart) OR . A paper copy in the mail If you have any lab test that is abnormal or we need to change your treatment, we will call you to review the results.   Testing/Procedures: None   Follow-Up: At [facility], you and your health needs are our priority.  As part of our continuing mission to provide you with exceptional heart care, we have created designated Provider Care Teams.  These Care Teams include your primary Cardiologist (physician) and Advanced Practice Providers (APPs -  Physician Assistants and Nurse Practitioners) who all work together to provide you with the care you need, when you need it.   Your next appointment:   6 month(s)  The format for your next appointment:   In Person  Provider:   Candee Furbish, MD

## 2020-03-09 LAB — CBC
Hematocrit: 49.5 % — ABNORMAL HIGH (ref 34.0–46.6)
Hemoglobin: 16.4 g/dL — ABNORMAL HIGH (ref 11.1–15.9)
MCH: 28 pg (ref 26.6–33.0)
MCHC: 33.1 g/dL (ref 31.5–35.7)
MCV: 85 fL (ref 79–97)
Platelets: 265 10*3/uL (ref 150–450)
RBC: 5.85 x10E6/uL — ABNORMAL HIGH (ref 3.77–5.28)
RDW: 14.2 % (ref 11.7–15.4)
WBC: 7.6 10*3/uL (ref 3.4–10.8)

## 2020-03-09 LAB — BASIC METABOLIC PANEL
BUN/Creatinine Ratio: 20 (ref 12–28)
BUN: 19 mg/dL (ref 8–27)
CO2: 25 mmol/L (ref 20–29)
Calcium: 10 mg/dL (ref 8.7–10.3)
Chloride: 97 mmol/L (ref 96–106)
Creatinine, Ser: 0.94 mg/dL (ref 0.57–1.00)
GFR calc Af Amer: 69 mL/min/{1.73_m2} (ref >59)
GFR calc non Af Amer: 60 mL/min/{1.73_m2} (ref >59)
Glucose: 133 mg/dL — ABNORMAL HIGH (ref 65–99)
Potassium: 4.1 mmol/L (ref 3.5–5.2)
Sodium: 142 mmol/L (ref 134–144)

## 2020-03-10 ENCOUNTER — Telehealth: Payer: Self-pay | Admitting: Cardiology

## 2020-03-10 NOTE — Telephone Encounter (Signed)
   Primary Cardiologist: Candee Furbish, MD  Chart reviewed as part of pre-operative protocol coverage. Originally received pre-op clearance form for anterior cervical decompression fusion C5-6 with instrumentation and allograft on 02/21/2020. Patient was seen by Kathyrn Drown, NP, on 03/08/2020 for pre-op evaluation. Per her office visit note: "The patient does not have any unstable cardiac conditions. Upon evaluation today, shecannot achieve 4 METs or greater secondary to multiple orthopedic and balance issues.  At baseline, she ambulates with a Rollator and is unable to walk up stairs or prolonged distances without shortness of breath secondary to COPD. She is to be cleared by pulmonary medicine prior to surgery. She denies anginal symptoms and has no prior history of CAD however does have aortic atherosclerosis on CT. According to [facility] and AHA guidelines, sherequires no further cardiac workup however would be considered high risk for surgical procedure at this time given her chronic pulmonary issues. Her revised risk of peri-procedural MI or cardiac arrest following surgeryis moderate."  Per Pharmacy and office protocol, patient can hold Eliquis for 3 days prior to procedure. This should be restarted as soon as able post-operatively.  I will route this recommendation to the requesting party via Epic fax function and remove from pre-op pool.  Please call with questions.  Darreld Mclean, PA-C 03/10/2020, 9:51 AM

## 2020-03-10 NOTE — Telephone Encounter (Signed)
Follow Up:     Butch Penny calling to check on the status of pt's clearance. Please fax this  To [phone].

## 2020-03-16 DIAGNOSIS — M19041 Primary osteoarthritis, right hand: Secondary | ICD-10-CM | POA: Diagnosis not present

## 2020-03-16 DIAGNOSIS — J449 Chronic obstructive pulmonary disease, unspecified: Secondary | ICD-10-CM | POA: Diagnosis not present

## 2020-03-16 DIAGNOSIS — F331 Major depressive disorder, recurrent, moderate: Secondary | ICD-10-CM | POA: Diagnosis not present

## 2020-03-16 DIAGNOSIS — M19042 Primary osteoarthritis, left hand: Secondary | ICD-10-CM | POA: Diagnosis not present

## 2020-03-16 DIAGNOSIS — E785 Hyperlipidemia, unspecified: Secondary | ICD-10-CM | POA: Diagnosis not present

## 2020-03-16 DIAGNOSIS — I480 Paroxysmal atrial fibrillation: Secondary | ICD-10-CM | POA: Diagnosis not present

## 2020-03-16 DIAGNOSIS — I10 Essential (primary) hypertension: Secondary | ICD-10-CM | POA: Diagnosis not present

## 2020-03-16 DIAGNOSIS — G43009 Migraine without aura, not intractable, without status migrainosus: Secondary | ICD-10-CM | POA: Diagnosis not present

## 2020-03-16 DIAGNOSIS — D509 Iron deficiency anemia, unspecified: Secondary | ICD-10-CM | POA: Diagnosis not present

## 2020-03-16 DIAGNOSIS — F329 Major depressive disorder, single episode, unspecified: Secondary | ICD-10-CM | POA: Diagnosis not present

## 2020-03-16 DIAGNOSIS — J4520 Mild intermittent asthma, uncomplicated: Secondary | ICD-10-CM | POA: Diagnosis not present

## 2020-03-23 DIAGNOSIS — E538 Deficiency of other specified B group vitamins: Secondary | ICD-10-CM | POA: Diagnosis not present

## 2020-04-04 ENCOUNTER — Ambulatory Visit (INDEPENDENT_AMBULATORY_CARE_PROVIDER_SITE_OTHER): Payer: Medicare Other | Admitting: Emergency Medicine

## 2020-04-04 ENCOUNTER — Other Ambulatory Visit: Payer: Self-pay

## 2020-04-04 ENCOUNTER — Encounter: Payer: Self-pay | Admitting: Emergency Medicine

## 2020-04-04 DIAGNOSIS — J449 Chronic obstructive pulmonary disease, unspecified: Secondary | ICD-10-CM

## 2020-04-04 NOTE — Assessment & Plan Note (Signed)
Your COPD falls into the severe range which places you at very high risk for surgery that would require general anesthesia.  This includes risk for extended hospitalization, extended mechanical ventilation, even death.  This does not preclude you from having a surgery if the benefits outweigh these risks.  You need to discuss this with your surgeon. Continue Trelegy 1 inhalation once daily.  Rinse and gargle after using. Keep your albuterol available to use 2 puffs when you need if shortness breath, chest tightness, wheezing. Continue your Claritin, fluticasone nasal spray as you have been taking them. Follow with [name] Molina in 6 months or sooner if you have any problems

## 2020-04-04 NOTE — Patient Instructions (Addendum)
Your COPD falls into the severe range which places you at very high risk for surgery that would require general anesthesia.  This includes risk for extended hospitalization, extended mechanical ventilation, even death.  This does not preclude you from having a surgery if the benefits outweigh these risks.  You need to discuss this with your surgeon. Continue Trelegy 1 inhalation once daily.  Rinse and gargle after using. Keep your albuterol available to use 2 puffs when you need if shortness breath, chest tightness, wheezing. Continue your Claritin, fluticasone nasal spray as you have been taking them. Follow with [name] Molina in 6 months or sooner if you have any problems

## 2020-04-04 NOTE — Progress Notes (Signed)
Subjective:    Patient ID: [name] Molina, female    DOB: 1945/11/17, 74 y.o.   MRN: [id number] HPI  ROV 01/15/2019 --this is a follow-up visit for 74 year old woman with a history of COPD/fixed asthma, chronic cough with exacerbating factors of allergic rhinitis and GERD.  She also has a 5 mm right upper lobe pulmonary nodule that we have been following with serial imaging.  She was treated with antibiotics and steroids in March of this year for an acute exacerbation. Medications include Trelegy.  She has albuterol which she uses approximately once a week. She is on Singulair (during allergy season), loratadine (all year), fluticasone nasal spray prn, omeprazole 20 mg daily. Her exertional tolerance is stable, but limited by breathing. Does cook, no other housework.   A CT scan of her chest was done 12/17/2018 which I reviewed and which shows that the 5 mm right upper lobe nodule has resolved, consistent with inflammatory process.  She has some bibasilar bandlike scarring but no other relevant findings.  Repeat pulmonary function testing was done today which I reviewed, shows severe obstruction without a clinically significant bronchodilator response.  She has normal lung volumes and a normal diffusion capacity.  Her FEV1 is 1.22 L (57% predicted), stable compared with spirometry from July 2019.  ROV 04/04/20 --74 year old woman with a history of COPD/fixed asthma, chronic cough in the setting of allergic rhinitis and GERD.  She has severe obstruction on pulmonary function testing 12/2018 with an FEV1 of 1.22 L (57% predicted).  We have been managing her on Trelegy. Uses albuterol rarely.  She uses loratadine daily, Singulair during the allergy season, fluticasone nasal spray.  Omeprazole daily. She reports that her breathing has been good lately. She had an acute exacerbation COPD and CHF, was found to have A Fib at that time. She has a monitor that shows that she is in / out A Fib, had tachycardia as  recently as 11/12.     No flowsheet data found.     Objective:   Physical Exam Vitals:   04/04/20 1139  BP: 114/82  Pulse: 87  Temp: (!) 97.5 F (36.4 C)  SpO2: 97%  Weight: 212 lb 3.2 oz (96.3 kg)  Height: 5\' 2"  (1.575 m)   Gen: Pleasant, overweight woman, in no distress,  normal affect, wheelchair  ENT: No lesions,  mouth clear,  oropharynx clear, no postnasal drip  Neck: No JVD, no stridor  Lungs: No use of accessory muscles, few scattered rhonchi, no wheeze  Cardiovascular: RRR, heart sounds normal, no murmur or gallops  Musculoskeletal: no deformities  Neuro: alert, non focal  Skin: Warm, no lesions or rashes     Assessment & Plan:  COPD GOLD C-D Your COPD falls into the severe range which places you at very high risk for surgery that would require general anesthesia.  This includes risk for extended hospitalization, extended mechanical ventilation, even death.  This does not preclude you from having a surgery if the benefits outweigh these risks.  You need to discuss this with your surgeon. Continue Trelegy 1 inhalation once daily.  Rinse and gargle after using. Keep your albuterol available to use 2 puffs when you need if shortness breath, chest tightness, wheezing. Continue your Claritin, fluticasone nasal spray as you have been taking them. Follow with Dr Lamonte Sakai in 6 months or sooner if you have any problems  Baltazar Apo, MD, PhD 04/04/2020, 12:15 PM [facility] [phone] or if no answer [phone]

## 2020-04-07 DIAGNOSIS — I480 Paroxysmal atrial fibrillation: Secondary | ICD-10-CM | POA: Diagnosis not present

## 2020-04-07 DIAGNOSIS — D509 Iron deficiency anemia, unspecified: Secondary | ICD-10-CM | POA: Diagnosis not present

## 2020-04-07 DIAGNOSIS — M19041 Primary osteoarthritis, right hand: Secondary | ICD-10-CM | POA: Diagnosis not present

## 2020-04-07 DIAGNOSIS — G43009 Migraine without aura, not intractable, without status migrainosus: Secondary | ICD-10-CM | POA: Diagnosis not present

## 2020-04-07 DIAGNOSIS — F331 Major depressive disorder, recurrent, moderate: Secondary | ICD-10-CM | POA: Diagnosis not present

## 2020-04-07 DIAGNOSIS — I10 Essential (primary) hypertension: Secondary | ICD-10-CM | POA: Diagnosis not present

## 2020-04-07 DIAGNOSIS — K219 Gastro-esophageal reflux disease without esophagitis: Secondary | ICD-10-CM | POA: Diagnosis not present

## 2020-04-07 DIAGNOSIS — J4520 Mild intermittent asthma, uncomplicated: Secondary | ICD-10-CM | POA: Diagnosis not present

## 2020-04-07 DIAGNOSIS — M19042 Primary osteoarthritis, left hand: Secondary | ICD-10-CM | POA: Diagnosis not present

## 2020-04-07 DIAGNOSIS — F329 Major depressive disorder, single episode, unspecified: Secondary | ICD-10-CM | POA: Diagnosis not present

## 2020-04-07 DIAGNOSIS — E785 Hyperlipidemia, unspecified: Secondary | ICD-10-CM | POA: Diagnosis not present

## 2020-04-07 DIAGNOSIS — J449 Chronic obstructive pulmonary disease, unspecified: Secondary | ICD-10-CM | POA: Diagnosis not present

## 2020-04-19 ENCOUNTER — Other Ambulatory Visit: Payer: Self-pay | Admitting: Orthopedic Surgery

## 2020-04-20 DIAGNOSIS — F331 Major depressive disorder, recurrent, moderate: Secondary | ICD-10-CM | POA: Diagnosis not present

## 2020-04-20 DIAGNOSIS — J449 Chronic obstructive pulmonary disease, unspecified: Secondary | ICD-10-CM | POA: Diagnosis not present

## 2020-04-20 DIAGNOSIS — G43009 Migraine without aura, not intractable, without status migrainosus: Secondary | ICD-10-CM | POA: Diagnosis not present

## 2020-04-20 DIAGNOSIS — I10 Essential (primary) hypertension: Secondary | ICD-10-CM | POA: Diagnosis not present

## 2020-04-20 DIAGNOSIS — M19041 Primary osteoarthritis, right hand: Secondary | ICD-10-CM | POA: Diagnosis not present

## 2020-04-20 DIAGNOSIS — F329 Major depressive disorder, single episode, unspecified: Secondary | ICD-10-CM | POA: Diagnosis not present

## 2020-04-20 DIAGNOSIS — J4520 Mild intermittent asthma, uncomplicated: Secondary | ICD-10-CM | POA: Diagnosis not present

## 2020-04-20 DIAGNOSIS — K219 Gastro-esophageal reflux disease without esophagitis: Secondary | ICD-10-CM | POA: Diagnosis not present

## 2020-04-20 DIAGNOSIS — D509 Iron deficiency anemia, unspecified: Secondary | ICD-10-CM | POA: Diagnosis not present

## 2020-04-20 DIAGNOSIS — M19042 Primary osteoarthritis, left hand: Secondary | ICD-10-CM | POA: Diagnosis not present

## 2020-04-20 DIAGNOSIS — E785 Hyperlipidemia, unspecified: Secondary | ICD-10-CM | POA: Diagnosis not present

## 2020-04-20 DIAGNOSIS — I480 Paroxysmal atrial fibrillation: Secondary | ICD-10-CM | POA: Diagnosis not present

## 2020-05-18 NOTE — Progress Notes (Signed)
[facility] - [place], [place] - [address] [place] [address] Phone: [phone] Fax: [phone]      Your procedure is scheduled on Wednesday, January 5th.  Report to [facility] Main Entrance "A" at 5:30 A.M., and check in at the Admitting office.  Call this number if you have problems the morning of surgery:  [phone]  Call [phone] if you have any questions prior to your surgery date Monday-Friday 8am-4pm    Remember:  Do not eat after midnight the night before your surgery  You may drink clear liquids until 5:30 AM the morning of your surgery.   Clear liquids allowed are: Water, Non-Citrus Juices (without pulp), Carbonated Beverages, Clear Tea, Black Coffee Only, and Gatorade  Please finish your pre-surgery Ensure by 5:30 AM, the morning of surgery.  Do not sip.  Drink all in one sitting.  Nothing else to drink after you finish the Ensure.    Take these medicines the morning of surgery with A SIP OF WATER   Tylenol - if needed  Albuterol inhaler - if needed (bring with you on day of surgery)  Eye drops - if needed  Duloxetine (Cymbalta)  Nasal spray - if needed  Claritin - if needed  Lorazepam (Ativan)   Metoprolol succinate  Omeprazole (Prilosec)  Pravastatin (Pravachol)  Trelegy Ellipta inhaler - if needed   Follow your surgeon's instructions on when to stop Aspirin & Eliquis.  If no instructions were given by your surgeon then you will need to call the office to get those instructions.    As of today, STOP taking Aleve, Naproxen, Ibuprofen, Motrin, Advil, Goody's, BC's, all herbal medications, fish oil, and all vitamins.                      Do not wear jewelry, make up, or nail polish            Do not wear lotions, powders, perfumes, or deodorant.            Do not shave 48 hours prior to surgery.              Do not bring valuables to the hospital.            [facility] is not  responsible for any belongings or valuables.  Do NOT Smoke (Tobacco/Vaping) or drink Alcohol 24 hours prior to your procedure If you use a CPAP at night, you may bring all equipment for your overnight stay.   Contacts, glasses, dentures or bridgework may not be worn into surgery.      For patients admitted to the hospital, discharge time will be determined by your treatment team.   Patients discharged the day of surgery will not be allowed to drive home, and someone needs to stay with them for 24 hours.    Special instructions:   [place]- Preparing For Surgery  Before surgery, you can play an important role. Because skin is not sterile, your skin needs to be as free of germs as possible. You can reduce the number of germs on your skin by washing with CHG (chlorahexidine gluconate) Soap before surgery.  CHG is an antiseptic cleaner which kills germs and bonds with the skin to continue killing germs even after washing.    Oral Hygiene is also important to reduce your risk of infection.  Remember - BRUSH YOUR TEETH THE MORNING OF SURGERY WITH YOUR REGULAR  TOOTHPASTE  Please do not use if you have an allergy to CHG or antibacterial soaps. If your skin becomes reddened/irritated stop using the CHG.  Do not shave (including legs and underarms) for at least 48 hours prior to first CHG shower. It is OK to shave your face.  Please follow these instructions carefully.   1. Shower the NIGHT BEFORE SURGERY and the MORNING OF SURGERY with CHG Soap.   2. If you chose to wash your hair, wash your hair first as usual with your normal shampoo.  3. After you shampoo, rinse your hair and body thoroughly to remove the shampoo.  4. Use CHG as you would any other liquid soap. You can apply CHG directly to the skin and wash gently with a scrungie or a clean washcloth.   5. Apply the CHG Soap to your body ONLY FROM THE NECK DOWN.  Do not use on open wounds or open sores. Avoid contact with your eyes,  ears, mouth and genitals (private parts). Wash Face and genitals (private parts)  with your normal soap.   6. Wash thoroughly, paying special attention to the area where your surgery will be performed.  7. Thoroughly rinse your body with warm water from the neck down.  8. DO NOT shower/wash with your normal soap after using and rinsing off the CHG Soap.  9. Pat yourself dry with a CLEAN TOWEL.  10. Wear CLEAN PAJAMAS to bed the night before surgery  11. Place CLEAN SHEETS on your bed the night of your first shower and DO NOT SLEEP WITH PETS.   Day of Surgery: Wear Clean/Comfortable clothing the morning of surgery Do not apply any deodorants/lotions.   Remember to brush your teeth WITH YOUR REGULAR TOOTHPASTE.   Please read over the following fact sheets that you were given.

## 2020-05-22 ENCOUNTER — Encounter (HOSPITAL_COMMUNITY)
Admission: RE | Admit: 2020-05-22 | Discharge: 2020-05-22 | Disposition: A | Discharge status: Home / Self Care | Payer: Medicare Other | Source: Ambulatory Visit | Attending: Orthopedic Surgery | Admitting: Orthopedic Surgery

## 2020-05-22 ENCOUNTER — Other Ambulatory Visit (HOSPITAL_COMMUNITY)
Admission: RE | Admit: 2020-05-22 | Discharge: 2020-05-22 | Disposition: A | Discharge status: Home / Self Care | Payer: Medicare Other | Source: Ambulatory Visit | Attending: Orthopedic Surgery | Admitting: Orthopedic Surgery

## 2020-05-22 ENCOUNTER — Other Ambulatory Visit: Payer: Self-pay

## 2020-05-22 ENCOUNTER — Encounter (HOSPITAL_COMMUNITY): Payer: Self-pay

## 2020-05-22 DIAGNOSIS — Z01812 Encounter for preprocedural laboratory examination: Secondary | ICD-10-CM | POA: Insufficient documentation

## 2020-05-22 DIAGNOSIS — Z20822 Contact with and (suspected) exposure to covid-19: Secondary | ICD-10-CM | POA: Insufficient documentation

## 2020-05-22 HISTORY — DX: Family history of other specified conditions: Z84.89

## 2020-05-22 LAB — CBC WITH DIFFERENTIAL/PLATELET
Abs Immature Granulocytes: 0.03 10*3/uL (ref 0.00–0.07)
Basophils Absolute: 0.1 10*3/uL (ref 0.0–0.1)
Basophils Relative: 2 %
Eosinophils Absolute: 0.2 10*3/uL (ref 0.0–0.5)
Eosinophils Relative: 3 %
HCT: 43.7 % (ref 36.0–46.0)
Hemoglobin: 14.5 g/dL (ref 12.0–15.0)
Immature Granulocytes: 1 %
Lymphocytes Relative: 16 %
Lymphs Abs: 0.9 10*3/uL (ref 0.7–4.0)
MCH: 28.3 pg (ref 26.0–34.0)
MCHC: 33.2 g/dL (ref 30.0–36.0)
MCV: 85.4 fL (ref 80.0–100.0)
Monocytes Absolute: 0.6 10*3/uL (ref 0.1–1.0)
Monocytes Relative: 11 %
Neutro Abs: 3.8 10*3/uL (ref 1.7–7.7)
Neutrophils Relative %: 67 %
Platelets: 217 10*3/uL (ref 150–400)
RBC: 5.12 MIL/uL — ABNORMAL HIGH (ref 3.87–5.11)
RDW: 14.5 % (ref 11.5–15.5)
WBC: 5.6 10*3/uL (ref 4.0–10.5)
nRBC: 0 % (ref 0.0–0.2)

## 2020-05-22 LAB — COMPREHENSIVE METABOLIC PANEL
ALT: 19 U/L (ref 0–44)
AST: 24 U/L (ref 15–41)
Albumin: 4.2 g/dL (ref 3.5–5.0)
Alkaline Phosphatase: 77 U/L (ref 38–126)
Anion gap: 14 (ref 5–15)
BUN: 12 mg/dL (ref 8–23)
CO2: 28 mmol/L (ref 22–32)
Calcium: 9.5 mg/dL (ref 8.9–10.3)
Chloride: 101 mmol/L (ref 98–111)
Creatinine, Ser: 0.76 mg/dL (ref 0.44–1.00)
GFR, Estimated: >60 mL/min (ref >60)
Glucose, Bld: 126 mg/dL — ABNORMAL HIGH (ref 70–99)
Potassium: 4.1 mmol/L (ref 3.5–5.1)
Sodium: 143 mmol/L (ref 135–145)
Total Bilirubin: 0.9 mg/dL (ref 0.3–1.2)
Total Protein: 6.8 g/dL (ref 6.5–8.1)

## 2020-05-22 LAB — URINALYSIS, ROUTINE W REFLEX MICROSCOPIC
Bilirubin Urine: NEGATIVE
Glucose, UA: NEGATIVE mg/dL
Hgb urine dipstick: NEGATIVE
Ketones, ur: NEGATIVE mg/dL
Leukocytes,Ua: NEGATIVE
Nitrite: NEGATIVE
Protein, ur: NEGATIVE mg/dL
Specific Gravity, Urine: 1.006 (ref 1.005–1.030)
pH: 7 (ref 5.0–8.0)

## 2020-05-22 LAB — SURGICAL PCR SCREEN
MRSA, PCR: NEGATIVE
Staphylococcus aureus: NEGATIVE

## 2020-05-22 LAB — PROTIME-INR
INR: 1 (ref 0.8–1.2)
Prothrombin Time: 13.1 seconds (ref 11.4–15.2)

## 2020-05-22 LAB — APTT: aPTT: 30 seconds (ref 24–36)

## 2020-05-22 LAB — SARS CORONAVIRUS 2 (TAT 6-24 HRS): SARS Coronavirus 2: NEGATIVE

## 2020-05-22 NOTE — Progress Notes (Signed)
PCP - Laurann Montana Cardiologist - Dr. Azucena Cecil  Chest x-ray - N/A EKG - 03/08/20 Stress Test - 2010 ECHO - 09/16/19 Cardiac Cath - denies  Sleep Study - h/o OSA- most recent sleep study per patient- no longer needs CPAP  Blood Thinner Instructions: last dose of Eliquis/ASA 05/20/20. Aspirin Instructions: Patient instructed to hold all NSAID's, herbal medications, fish oil and vitamins as of today  Anesthesia review: cardiac history  Patient denies shortness of breath, fever, cough and chest pain at PAT appointment   Patient verbalized understanding of instructions that were given to them at the PAT appointment. Patient was also instructed that they will need to review over the PAT instructions again at home before surgery.

## 2020-05-23 ENCOUNTER — Encounter (HOSPITAL_COMMUNITY): Payer: Self-pay | Admitting: Anesthesiology

## 2020-05-23 ENCOUNTER — Encounter (HOSPITAL_COMMUNITY): Payer: Self-pay | Admitting: Emergency Medicine

## 2020-05-23 ENCOUNTER — Other Ambulatory Visit: Payer: Self-pay | Admitting: Cardiology

## 2020-05-23 NOTE — Anesthesia Preprocedure Evaluation (Deleted)
Anesthesia Evaluation    Airway        Dental   Pulmonary former smoker,           Cardiovascular hypertension,      Neuro/Psych    GI/Hepatic   Endo/Other    Renal/GU      Musculoskeletal   Abdominal   Peds  Hematology   Anesthesia Other Findings   Reproductive/Obstetrics                             Anesthesia Physical Anesthesia Plan  ASA:   Anesthesia Plan:    Post-op Pain Management:    Induction:   PONV Risk Score and Plan:   Airway Management Planned:   Additional Equipment:   Intra-op Plan:   Post-operative Plan:   Informed Consent:   Plan Discussed with:   Anesthesia Plan Comments: (See APP note by Joslyn Hy, FNP. Pt with severe COPD)        Anesthesia Quick Evaluation

## 2020-05-23 NOTE — Telephone Encounter (Signed)
Eliquis 5mg  refill request received. Patient is 75 years old, weight-96.6kg, Crea-0.76 on 05/22/2020, Diagnosis-Afib, and last seen by 07/20/2020 on 03/08/20. Dose is appropriate based on dosing criteria. Will send in refill to requested pharmacy.

## 2020-05-23 NOTE — Progress Notes (Signed)
Anesthesia Chart Review:   Case: [id number] Date/Time: 05/24/20 0815   Procedure: ANTERIOR CERVICAL DECOMPRESSION FUSION CERVICAL 5 - CERVICAL 6 WITH INSTRUMENTATION AND (N/A )   Anesthesia type: General   Pre-op diagnosis: PROGRESSIVE CERVICAL MYELOPATHY WITH AN MRI NOTABLE FOR SPINAL STENOSIS AND CORD COMPRESSION AT CERVICAL 5 - CERVICAL 6   [facility]: [facility] ROOM 05 / [facility]   Surgeons: Estill Bamberg, MD      DISCUSSION:  Pt is a 75 year old with hx afib, HTN, COPD, OSA  Pt to hold eliquis 3 days before surgery   VS: BP (!) 142/84   Pulse 86   Temp (!) 36.4 C (Oral)   Resp 17   Ht 5' 2.5" (1.588 m)   Wt 96.6 kg   SpO2 94%   BMI 38.34 kg/m    PROVIDERS: - PCP is Laurann Montana, MD   - Cardiologist is Donato Schultz, MD. Last office visit 03/08/20 with Georgie Chard, NP who cleared pt for surgery at moderate risk.   - Pulmonologist is Levy Pupa, MD. At last office visit 04/04/20, Dr. Delton Coombes noted "Your COPD falls into the severe range which places you at very high risk for surgery that would require general anesthesia.  This includes risk for extended hospitalization, extended mechanical ventilation, even death.  This does not preclude you from having a surgery if the benefits outweigh these risks.  You need to discuss this with your surgeon."   LABS: Labs reviewed: Acceptable for surgery. (all labs ordered are listed, but only abnormal results are displayed)  Labs Reviewed  CBC WITH DIFFERENTIAL/PLATELET - Abnormal; Notable for the following components:      Result Value   RBC 5.12 (*)    All other components within normal limits  COMPREHENSIVE METABOLIC PANEL - Abnormal; Notable for the following components:   Glucose, Bld 126 (*)    All other components within normal limits  SURGICAL PCR SCREEN  PROTIME-INR  APTT  URINALYSIS, ROUTINE W REFLEX MICROSCOPIC     IMAGES: 1 view CXR:  - No active disease.   EKG 03/08/20: NSR   CV: Cardiac event monitor 11/11/19:    Predominant rhythm was sinus rhythm with average heart rate of 77 bpm  Brief episodes of supraventricular tachycardia which appear to be paroxysmal atrial tachycardia not atrial fibrillation-longest episode was 10 seconds duration  No pauses no ventricular tachycardia no excessive ventricular ectopy Overall reassuring monitor with occasional brief episodes of paroxysmal atrial tachycardia. No evidence of atrial fibrillation during monitoring as was diagnosed during prior hospitalization. Even though we did not discover atrial fibrillation during this monitoring period, continue with anticoagulation, Eliquis. I personally reviewed telemetry records from hospital and atrial fibrillation clearly was present during that time.  Echo 09/16/19:  1. Left ventricular ejection fraction, by estimation, is 65 to 70%. The left ventricle has normal function. The left ventricle has no regional wall motion abnormalities. Left ventricular diastolic parameters are indeterminate.  2. Right ventricular systolic function is normal. The right ventricular size is normal. There is normal pulmonary artery systolic pressure.  3. Left atrial size was mildly dilated.  4. The mitral valve is normal in structure. Trivial mitral valve regurgitation. No evidence of mitral stenosis.  5. The aortic valve is normal in structure. Aortic valve regurgitation is not visualized. No aortic stenosis is present.  6. The inferior vena cava is dilated in size with <50% respiratory variability, suggesting right atrial pressure of 15 mmHg.     Past Medical  History:  Diagnosis Date  . Anxiety   . Asthma   . Atrial tachycardia ([place])   . Cataract   . COPD (chronic obstructive pulmonary disease) ([place])   . Diverticulosis   . Family history of adverse reaction to anesthesia    mother had post op N&V  . GERD (gastroesophageal reflux disease)   . Hyperlipidemia   . Hypertension   . Insomnia   . Macular degeneration   .  Migraines   . Neuropathy     Past Surgical History:  Procedure Laterality Date  . CERVICAL POLYPECTOMY     INSIDE AND OUTSIDE  . COLONOSCOPY    . EYE SURGERY Bilateral   . POLYPECTOMY    . REMOVED CARTLIAGE RIB      MEDICATIONS: . acetaminophen (TYLENOL) 650 MG CR tablet  . albuterol (PROVENTIL HFA;VENTOLIN HFA) 108 (90 Base) MCG/ACT inhaler  . apixaban (ELIQUIS) 5 MG TABS tablet  . aspirin 81 MG tablet  . BESIVANCE 0.6 % SUSP  . Cyanocobalamin (VITAMIN B-12 PO)  . DULoxetine (CYMBALTA) 60 MG capsule  . fluticasone (FLONASE) 50 MCG/ACT nasal spray  . furosemide (LASIX) 40 MG tablet  . loratadine-pseudoephedrine (CLARITIN-D 12-HOUR) 5-120 MG per tablet  . LORazepam (ATIVAN) 0.5 MG tablet  . metoprolol succinate (TOPROL-XL) 50 MG 24 hr tablet  . montelukast (SINGULAIR) 10 MG tablet  . Multiple Vitamins-Minerals (OCUVITE PO)  . omeprazole (PRILOSEC) 20 MG capsule  . pravastatin (PRAVACHOL) 40 MG tablet  . tizanidine (ZANAFLEX) 2 MG capsule  . TRELEGY ELLIPTA 100-62.5-25 MCG/INH AEPB   No current facility-administered medications for this encounter.    If no changes, I anticipate pt can proceed with surgery as scheduled.   Willeen Cass, PhD, FNP-BC [facility]/Anesthesiology Phone: [phone] 05/23/2020 9:29 AM

## 2020-05-24 ENCOUNTER — Ambulatory Visit (HOSPITAL_COMMUNITY): Admission: RE | Admit: 2020-05-24 | Payer: Medicare Other | Source: Home / Self Care | Admitting: Orthopedic Surgery

## 2020-05-24 ENCOUNTER — Encounter (HOSPITAL_COMMUNITY): Admission: RE | Payer: Self-pay | Source: Home / Self Care

## 2020-05-24 SURGERY — ANTERIOR CERVICAL DECOMPRESSION/DISCECTOMY FUSION 1 LEVEL
Anesthesia: General

## 2020-06-01 ENCOUNTER — Other Ambulatory Visit: Payer: Self-pay | Admitting: Orthopedic Surgery

## 2020-06-01 DIAGNOSIS — S61412A Laceration without foreign body of left hand, initial encounter: Secondary | ICD-10-CM | POA: Diagnosis not present

## 2020-06-13 ENCOUNTER — Other Ambulatory Visit: Payer: Self-pay

## 2020-06-13 ENCOUNTER — Encounter (INDEPENDENT_AMBULATORY_CARE_PROVIDER_SITE_OTHER): Payer: Medicare Other | Admitting: Ophthalmology

## 2020-06-13 DIAGNOSIS — I10 Essential (primary) hypertension: Secondary | ICD-10-CM

## 2020-06-13 DIAGNOSIS — H35033 Hypertensive retinopathy, bilateral: Secondary | ICD-10-CM | POA: Diagnosis not present

## 2020-06-13 DIAGNOSIS — H353221 Exudative age-related macular degeneration, left eye, with active choroidal neovascularization: Secondary | ICD-10-CM

## 2020-06-13 DIAGNOSIS — H43813 Vitreous degeneration, bilateral: Secondary | ICD-10-CM

## 2020-06-13 DIAGNOSIS — H353112 Nonexudative age-related macular degeneration, right eye, intermediate dry stage: Secondary | ICD-10-CM | POA: Diagnosis not present

## 2020-06-13 NOTE — Progress Notes (Signed)
[facility] - [place], [place] - [address] [place] [address] Phone: [phone] Fax: [phone]      Your procedure is scheduled on Friday, June 15, 2020 from 7:30AM - 10:00AM.  Report to [facility] Main Entrance "A" at 5:30 A.M., and check in at the Admitting office.  Call this number if you have problems the morning of surgery:  [phone]  Call [phone] if you have any questions prior to your surgery date Monday-Friday 8am-4pm    Remember:  Do not eat after midnight the night before your surgery  You may drink clear liquids until 4:30 AM the morning of your surgery.   Clear liquids allowed are: Water, Non-Citrus Juices (without pulp), Carbonated Beverages, Clear Tea, Black Coffee Only, and Gatorade  Please complete your PRE-SURGERY ENSURE that was provided to you by 4:30 AM the morning of surgery.  Please, if able, drink it in one setting. DO NOT SIP.     Take these medicines the morning of surgery with A SIP OF WATER:  DULoxetine (CYMBALTA)  fluticasone (FLONASE) loratadine-pseudoephedrine (CLARITIN-D 12-HOUR) TRELEGY ELLIPTA  IF NEEDED:  acetaminophen (TYLENOL) albuterol (PROVENTIL HFA;VENTOLIN HFA) LORazepam (ATIVAN)  Please bring all inhalers with you the day of surgery.   Follow your surgeon's instructions on when to stop Aspirin and Eliquis.  If no instructions were given by your surgeon then you will need to call the office to get those instructions.     As of today, STOP taking any Aleve, Naproxen, Ibuprofen, Motrin, Advil, Goody's, BC's, all herbal medications, fish oil, and all vitamins.                      Do not wear jewelry, make up, or nail polish            Do not wear lotions, powders, perfumes, or deodorant.            Do not shave 48 hours prior to surgery.            Do not bring valuables to the hospital.            [facility] is not responsible for any belongings or  valuables.  Do NOT Smoke (Tobacco/Vaping) or drink Alcohol 24 hours prior to your procedure If you use a CPAP at night, you may bring all equipment for your overnight stay.   Contacts, glasses, dentures or bridgework may not be worn into surgery.      For patients admitted to the hospital, discharge time will be determined by your treatment team.   Patients discharged the day of surgery will not be allowed to drive home, and someone needs to stay with them for 24 hours.    Special instructions:   [place]- Preparing For Surgery  Before surgery, you can play an important role. Because skin is not sterile, your skin needs to be as free of germs as possible. You can reduce the number of germs on your skin by washing with CHG (chlorahexidine gluconate) Soap before surgery.  CHG is an antiseptic cleaner which kills germs and bonds with the skin to continue killing germs even after washing.    Oral Hygiene is also important to reduce your risk of infection.  Remember - BRUSH YOUR TEETH THE MORNING OF SURGERY WITH YOUR REGULAR TOOTHPASTE  Please do not use if you have an allergy to CHG or antibacterial soaps. If your skin becomes reddened/irritated stop  using the CHG.  Do not shave (including legs and underarms) for at least 48 hours prior to first CHG shower. It is OK to shave your face.  Please follow these instructions carefully.   1. Shower the NIGHT BEFORE SURGERY and the MORNING OF SURGERY with CHG Soap.   2. If you chose to wash your hair, wash your hair first as usual with your normal shampoo.  3. After you shampoo, rinse your hair and body thoroughly to remove the shampoo.  4. Use CHG as you would any other liquid soap. You can apply CHG directly to the skin and wash gently with a scrungie or a clean washcloth.   5. Apply the CHG Soap to your body ONLY FROM THE NECK DOWN.  Do not use on open wounds or open sores. Avoid contact with your eyes, ears, mouth and genitals (private  parts). Wash Face and genitals (private parts)  with your normal soap.   6. Wash thoroughly, paying special attention to the area where your surgery will be performed.  7. Thoroughly rinse your body with warm water from the neck down.  8. DO NOT shower/wash with your normal soap after using and rinsing off the CHG Soap.  9. Pat yourself dry with a CLEAN TOWEL.  10. Wear CLEAN PAJAMAS to bed the night before surgery  11. Place CLEAN SHEETS on your bed the night of your first shower and DO NOT SLEEP WITH PETS.   Day of Surgery: Wear Clean/Comfortable clothing the morning of surgery Do not apply any deodorants/lotions.   Remember to brush your teeth WITH YOUR REGULAR TOOTHPASTE.   Please read over the following fact sheets that you were given.

## 2020-06-14 ENCOUNTER — Other Ambulatory Visit (HOSPITAL_COMMUNITY)
Admission: RE | Admit: 2020-06-14 | Discharge: 2020-06-14 | Disposition: A | Discharge status: Home / Self Care | Payer: Medicare Other | Source: Ambulatory Visit | Attending: Orthopedic Surgery | Admitting: Orthopedic Surgery

## 2020-06-14 ENCOUNTER — Other Ambulatory Visit: Payer: Self-pay

## 2020-06-14 ENCOUNTER — Encounter (HOSPITAL_COMMUNITY): Payer: Self-pay

## 2020-06-14 ENCOUNTER — Encounter (HOSPITAL_COMMUNITY)
Admission: RE | Admit: 2020-06-14 | Discharge: 2020-06-14 | Disposition: A | Discharge status: Home / Self Care | Payer: Medicare Other | Source: Ambulatory Visit | Attending: Orthopedic Surgery | Admitting: Orthopedic Surgery

## 2020-06-14 DIAGNOSIS — Z01812 Encounter for preprocedural laboratory examination: Secondary | ICD-10-CM | POA: Insufficient documentation

## 2020-06-14 DIAGNOSIS — Z20822 Contact with and (suspected) exposure to covid-19: Secondary | ICD-10-CM | POA: Insufficient documentation

## 2020-06-14 HISTORY — DX: Unspecified osteoarthritis, unspecified site: M19.90

## 2020-06-14 HISTORY — DX: Sleep apnea, unspecified: G47.30

## 2020-06-14 HISTORY — DX: Malignant (primary) neoplasm, unspecified: C80.1

## 2020-06-14 HISTORY — DX: Depression, unspecified: F32.A

## 2020-06-14 HISTORY — DX: Personal history of other diseases of the digestive system: Z87.19

## 2020-06-14 HISTORY — DX: Pneumonia, unspecified organism: J18.9

## 2020-06-14 HISTORY — DX: Other complications of anesthesia, initial encounter: T88.59XA

## 2020-06-14 HISTORY — DX: Heart failure, unspecified: I50.9

## 2020-06-14 LAB — URINALYSIS, ROUTINE W REFLEX MICROSCOPIC
Bacteria, UA: NONE SEEN
Bilirubin Urine: NEGATIVE
Glucose, UA: NEGATIVE mg/dL
Hgb urine dipstick: NEGATIVE
Ketones, ur: NEGATIVE mg/dL
Nitrite: NEGATIVE
Protein, ur: NEGATIVE mg/dL
Specific Gravity, Urine: 1.027 (ref 1.005–1.030)
pH: 5 (ref 5.0–8.0)

## 2020-06-14 LAB — COMPREHENSIVE METABOLIC PANEL
ALT: 21 U/L (ref 0–44)
AST: 23 U/L (ref 15–41)
Albumin: 4 g/dL (ref 3.5–5.0)
Alkaline Phosphatase: 71 U/L (ref 38–126)
Anion gap: 11 (ref 5–15)
BUN: 15 mg/dL (ref 8–23)
CO2: 26 mmol/L (ref 22–32)
Calcium: 9.4 mg/dL (ref 8.9–10.3)
Chloride: 107 mmol/L (ref 98–111)
Creatinine, Ser: 0.77 mg/dL (ref 0.44–1.00)
GFR, Estimated: >60 mL/min (ref >60)
Glucose, Bld: 105 mg/dL — ABNORMAL HIGH (ref 70–99)
Potassium: 4.3 mmol/L (ref 3.5–5.1)
Sodium: 144 mmol/L (ref 135–145)
Total Bilirubin: 0.8 mg/dL (ref 0.3–1.2)
Total Protein: 6.9 g/dL (ref 6.5–8.1)

## 2020-06-14 LAB — APTT: aPTT: 29 seconds (ref 24–36)

## 2020-06-14 LAB — CBC WITH DIFFERENTIAL/PLATELET
Abs Immature Granulocytes: 0.03 10*3/uL (ref 0.00–0.07)
Basophils Absolute: 0.1 10*3/uL (ref 0.0–0.1)
Basophils Relative: 2 %
Eosinophils Absolute: 0.2 10*3/uL (ref 0.0–0.5)
Eosinophils Relative: 3 %
HCT: 45.1 % (ref 36.0–46.0)
Hemoglobin: 14.6 g/dL (ref 12.0–15.0)
Immature Granulocytes: 1 %
Lymphocytes Relative: 21 %
Lymphs Abs: 1.3 10*3/uL (ref 0.7–4.0)
MCH: 28.2 pg (ref 26.0–34.0)
MCHC: 32.4 g/dL (ref 30.0–36.0)
MCV: 87.2 fL (ref 80.0–100.0)
Monocytes Absolute: 0.7 10*3/uL (ref 0.1–1.0)
Monocytes Relative: 11 %
Neutro Abs: 4 10*3/uL (ref 1.7–7.7)
Neutrophils Relative %: 62 %
Platelets: 216 10*3/uL (ref 150–400)
RBC: 5.17 MIL/uL — ABNORMAL HIGH (ref 3.87–5.11)
RDW: 14.5 % (ref 11.5–15.5)
WBC: 6.4 10*3/uL (ref 4.0–10.5)
nRBC: 0 % (ref 0.0–0.2)

## 2020-06-14 LAB — PROTIME-INR
INR: 1 (ref 0.8–1.2)
Prothrombin Time: 13.1 seconds (ref 11.4–15.2)

## 2020-06-14 LAB — TYPE AND SCREEN
ABO/RH(D): A POS
Antibody Screen: NEGATIVE

## 2020-06-14 MED ORDER — VANCOMYCIN HCL 1500 MG/300ML IV SOLN
1500.0000 mg | INTRAVENOUS | Status: AC
  Administered 2020-06-15: 1500 mg via INTRAVENOUS
  Filled 2020-06-14: qty 300

## 2020-06-14 NOTE — Progress Notes (Signed)
[facility] - [place], [place] - [address] [place] [address] Phone: [phone] Fax: [phone]      Your procedure is scheduled on Thursday, June 15, 2020 from 7:30AM - 10:00AM.  Report to [facility] Main Entrance "A" at 5:30 A.M., and check in at the Admitting office.  Call this number if you have problems the morning of surgery:  [phone]  Call [phone] if you have any questions prior to your surgery date Monday-Friday 8am-4pm    Remember:  Do not eat after midnight the night before your surgery  You may drink clear liquids until 4:30 AM the morning of your surgery.   Clear liquids allowed are: Water, Non-Citrus Juices (without pulp), Carbonated Beverages, Clear Tea, Black Coffee Only, and Gatorade  Please complete your PRE-SURGERY ENSURE that was provided to you by 4:30 AM the morning of surgery.  Please, if able, drink it in one setting. DO NOT SIP.     Take these medicines the morning of surgery with A SIP OF WATER:  DULoxetine (CYMBALTA)  fluticasone (FLONASE) loratadine-pseudoephedrine (CLARITIN-D 12-HOUR) TRELEGY ELLIPTA  IF NEEDED:  acetaminophen (TYLENOL) albuterol (PROVENTIL HFA;VENTOLIN HFA) LORazepam (ATIVAN)  Please bring all inhalers with you the day of surgery.   Follow your surgeon's instructions on when to stop Aspirin and Eliquis.  If no instructions were given by your surgeon then you will need to call the office to get those instructions.     As of today, STOP taking any Aleve, Naproxen, Ibuprofen, Motrin, Advil, Goody's, BC's, all herbal medications, fish oil, and all vitamins.                      Do not wear jewelry, make up, or nail polish            Do not wear lotions, powders, perfumes, or deodorant.            Do not shave 48 hours prior to surgery.            Do not bring valuables to the hospital.            [facility] is not responsible for any belongings  or valuables.  Do NOT Smoke (Tobacco/Vaping) or drink Alcohol 24 hours prior to your procedure If you use a CPAP at night, you may bring all equipment for your overnight stay.   Contacts, glasses, dentures or bridgework may not be worn into surgery.      For patients admitted to the hospital, discharge time will be determined by your treatment team.   Patients discharged the day of surgery will not be allowed to drive home, and someone needs to stay with them for 24 hours.    Special instructions:   [facility]- Preparing For Surgery  Before surgery, you can play an important role. Because skin is not sterile, your skin needs to be as free of germs as possible. You can reduce the number of germs on your skin by washing with CHG (chlorahexidine gluconate) Soap before surgery.  CHG is an antiseptic cleaner which kills germs and bonds with the skin to continue killing germs even after washing.    Oral Hygiene is also important to reduce your risk of infection.  Remember - BRUSH YOUR TEETH THE MORNING OF SURGERY WITH YOUR REGULAR TOOTHPASTE  Please do not use if you have an allergy to CHG or antibacterial soaps. If your skin becomes reddened/irritated stop  using the CHG.  Do not shave (including legs and underarms) for at least 48 hours prior to first CHG shower. It is OK to shave your face.  Please follow these instructions carefully.   1. Shower the NIGHT BEFORE SURGERY and the MORNING OF SURGERY with CHG Soap.   2. If you chose to wash your hair, wash your hair first as usual with your normal shampoo.  3. After you shampoo, rinse your hair and body thoroughly to remove the shampoo.  4. Use CHG as you would any other liquid soap. You can apply CHG directly to the skin and wash gently with a scrungie or a clean washcloth.   5. Apply the CHG Soap to your body ONLY FROM THE NECK DOWN.  Do not use on open wounds or open sores. Avoid contact with your eyes, ears, mouth and genitals (private  parts). Wash Face and genitals (private parts)  with your normal soap.   6. Wash thoroughly, paying special attention to the area where your surgery will be performed.  7. Thoroughly rinse your body with warm water from the neck down.  8. DO NOT shower/wash with your normal soap after using and rinsing off the CHG Soap.  9. Pat yourself dry with a CLEAN TOWEL.  10. Wear CLEAN PAJAMAS to bed the night before surgery  11. Place CLEAN SHEETS on your bed the night of your first shower and DO NOT SLEEP WITH PETS.   Day of Surgery: Wear Clean/Comfortable clothing the morning of surgery Do not apply any deodorants/lotions.   Remember to brush your teeth WITH YOUR REGULAR TOOTHPASTE.   Please read over the following fact sheets that you were given.

## 2020-06-14 NOTE — Anesthesia Preprocedure Evaluation (Addendum)
Anesthesia Evaluation  Patient identified by MRN, date of birth, ID band Patient awake    Reviewed: Allergy & Precautions, NPO status , Patient's Chart, lab work & pertinent test results, reviewed documented beta blocker date and time   History of Anesthesia Complications Negative for: history of anesthetic complications  Airway Mallampati: II  TM Distance: >3 FB Neck ROM: Full    Dental  (+) Dental Advisory Given, Chipped, Poor Dentition   Pulmonary shortness of breath, sleep apnea (does not use CPAP) , COPD,  COPD inhaler, former smoker,    breath sounds clear to auscultation       Cardiovascular hypertension, Pt. on medications and Pt. on home beta blockers (-) angina+ dysrhythmias Atrial Fibrillation  Rhythm:Irregular Rate:Normal  08/2019 ECHO: EF 65-70%, no significant valvular disease   Neuro/Psych Anxiety Depression    GI/Hepatic Neg liver ROS, hiatal hernia, GERD  Medicated and Controlled,  Endo/Other  Morbid obesity  Renal/GU negative Renal ROS     Musculoskeletal  (+) Arthritis ,   Abdominal (+) + obese,   Peds  Hematology  (+) Blood dyscrasia (eliquis), ,   Anesthesia Other Findings   Reproductive/Obstetrics                           Anesthesia Physical Anesthesia Plan  ASA: III  Anesthesia Plan: General   Post-op Pain Management:    Induction: Intravenous  PONV Risk Score and Plan: 3 and Ondansetron, Dexamethasone and Treatment may vary due to age or medical condition  Airway Management Planned: Oral ETT and Video Laryngoscope Planned  Additional Equipment:   Intra-op Plan:   Post-operative Plan: Extubation in OR  Informed Consent: I have reviewed the patients History and Physical, chart, labs and discussed the procedure including the risks, benefits and alternatives for the proposed anesthesia with the patient or authorized representative who has indicated his/her  understanding and acceptance.     Dental advisory given  Plan Discussed with: CRNA and Surgeon  Anesthesia Plan Comments: (See recent PAT note by Willeen Cass, NP on 05/22/20. Cardiac and pulmonary clearances per note. No interim health changes noted. Preop labs reviewed, unremarkable.  )     Anesthesia Quick Evaluation

## 2020-06-14 NOTE — Progress Notes (Signed)
PCP - cynthia white at [facility] Cardiologist - shains Pulmon: [place]  Chest x-ray - 03/01/20 EKG - 03/08/20 Stress Test - 2010 ECHO - 09/16/19 Cardiac Cath - na  Sleep Study - 06/14/16, pt. States she had a history of it, but now doctor told her she doesn't need to use cpap   Blood Thinner Instructions: last dose  Monday Aspirin Instructions: last dose Monday  ERAS Protcol - yes PRE-SURGERY Ensure  given  COVID TEST- 06/14/20   Anesthesia review: called Karel Jarvis, PA of pt. Surgery 06/15/20 to review cardiac hx.  Patient denies shortness of breath, fever, cough and chest pain at PAT appointment   All instructions explained to the patient, with a verbal understanding of the material. Patient agrees to go over the instructions while at home for a better understanding. Patient also instructed to self quarantine after being tested for COVID-19. The opportunity to ask questions was provided.  c

## 2020-06-15 ENCOUNTER — Encounter (HOSPITAL_COMMUNITY)
Admission: RE | Disposition: A | Discharge status: Home / Self Care | Payer: Self-pay | Source: Home / Self Care | Attending: Orthopedic Surgery

## 2020-06-15 ENCOUNTER — Ambulatory Visit (HOSPITAL_COMMUNITY): Payer: Medicare Other | Admitting: Anesthesiology

## 2020-06-15 ENCOUNTER — Ambulatory Visit (HOSPITAL_COMMUNITY): Payer: Medicare Other | Admitting: Physician Assistant

## 2020-06-15 ENCOUNTER — Ambulatory Visit (HOSPITAL_COMMUNITY)
Admission: RE | Admit: 2020-06-15 | Discharge: 2020-06-15 | Disposition: A | Discharge status: Home / Self Care | Payer: Medicare Other | Attending: Orthopedic Surgery | Admitting: Orthopedic Surgery

## 2020-06-15 ENCOUNTER — Ambulatory Visit (HOSPITAL_COMMUNITY): Payer: Medicare Other

## 2020-06-15 ENCOUNTER — Encounter (HOSPITAL_COMMUNITY): Payer: Self-pay | Admitting: Orthopedic Surgery

## 2020-06-15 DIAGNOSIS — M4322 Fusion of spine, cervical region: Secondary | ICD-10-CM | POA: Diagnosis not present

## 2020-06-15 DIAGNOSIS — Z7901 Long term (current) use of anticoagulants: Secondary | ICD-10-CM | POA: Diagnosis not present

## 2020-06-15 DIAGNOSIS — I10 Essential (primary) hypertension: Secondary | ICD-10-CM | POA: Diagnosis not present

## 2020-06-15 DIAGNOSIS — Z981 Arthrodesis status: Secondary | ICD-10-CM | POA: Diagnosis not present

## 2020-06-15 DIAGNOSIS — G959 Disease of spinal cord, unspecified: Secondary | ICD-10-CM | POA: Insufficient documentation

## 2020-06-15 DIAGNOSIS — Z419 Encounter for procedure for purposes other than remedying health state, unspecified: Secondary | ICD-10-CM

## 2020-06-15 DIAGNOSIS — Z87891 Personal history of nicotine dependence: Secondary | ICD-10-CM | POA: Diagnosis not present

## 2020-06-15 DIAGNOSIS — M4802 Spinal stenosis, cervical region: Secondary | ICD-10-CM | POA: Diagnosis not present

## 2020-06-15 DIAGNOSIS — M4302 Spondylolysis, cervical region: Secondary | ICD-10-CM | POA: Diagnosis not present

## 2020-06-15 DIAGNOSIS — M5000 Cervical disc disorder with myelopathy, unspecified cervical region: Secondary | ICD-10-CM | POA: Diagnosis not present

## 2020-06-15 DIAGNOSIS — G4733 Obstructive sleep apnea (adult) (pediatric): Secondary | ICD-10-CM | POA: Diagnosis not present

## 2020-06-15 DIAGNOSIS — Z79899 Other long term (current) drug therapy: Secondary | ICD-10-CM | POA: Diagnosis not present

## 2020-06-15 DIAGNOSIS — Z7982 Long term (current) use of aspirin: Secondary | ICD-10-CM | POA: Diagnosis not present

## 2020-06-15 DIAGNOSIS — M4712 Other spondylosis with myelopathy, cervical region: Secondary | ICD-10-CM | POA: Diagnosis not present

## 2020-06-15 DIAGNOSIS — Z4889 Encounter for other specified surgical aftercare: Secondary | ICD-10-CM | POA: Diagnosis not present

## 2020-06-15 DIAGNOSIS — Z85828 Personal history of other malignant neoplasm of skin: Secondary | ICD-10-CM | POA: Diagnosis not present

## 2020-06-15 HISTORY — PX: ANTERIOR CERVICAL DECOMP/DISCECTOMY FUSION: SHX1161

## 2020-06-15 LAB — SARS CORONAVIRUS 2 (TAT 6-24 HRS): SARS Coronavirus 2: NEGATIVE

## 2020-06-15 LAB — ABO/RH: ABO/RH(D): A POS

## 2020-06-15 SURGERY — ANTERIOR CERVICAL DECOMPRESSION/DISCECTOMY FUSION 1 LEVEL
Anesthesia: General | Site: Spine Cervical

## 2020-06-15 MED ORDER — HYDROMORPHONE HCL 1 MG/ML IJ SOLN: 0.2500 mg | INTRAMUSCULAR | Status: DC | PRN

## 2020-06-15 MED ORDER — ACETAMINOPHEN 500 MG PO TABS: 1000.0000 mg | ORAL_TABLET | Freq: Once | ORAL | Status: AC

## 2020-06-15 MED ORDER — PROMETHAZINE HCL 25 MG/ML IJ SOLN
6.2500 mg | INTRAMUSCULAR | Status: DC | PRN
Start: 2020-06-15 — End: 2020-06-15

## 2020-06-15 MED ORDER — ROCURONIUM BROMIDE 10 MG/ML (PF) SYRINGE
PREFILLED_SYRINGE | INTRAVENOUS | Status: DC | PRN
  Administered 2020-06-15: 70 mg via INTRAVENOUS
  Administered 2020-06-15: 10 mg via INTRAVENOUS

## 2020-06-15 MED ORDER — OXYCODONE-ACETAMINOPHEN 5-325 MG PO TABS
1.0000 | ORAL_TABLET | ORAL | 0 refills | Status: AC | PRN
Start: 2020-06-15 — End: 2020-06-22

## 2020-06-15 MED ORDER — PROPOFOL 10 MG/ML IV BOLUS
INTRAVENOUS | Status: DC | PRN
  Administered 2020-06-15: 130 mg via INTRAVENOUS

## 2020-06-15 MED ORDER — PHENYLEPHRINE 40 MCG/ML (10ML) SYRINGE FOR IV PUSH (FOR BLOOD PRESSURE SUPPORT)
PREFILLED_SYRINGE | INTRAVENOUS | Status: AC
  Filled 2020-06-15: qty 10

## 2020-06-15 MED ORDER — MEPERIDINE HCL 25 MG/ML IJ SOLN: 6.2500 mg | INTRAMUSCULAR | Status: DC | PRN

## 2020-06-15 MED ORDER — LIDOCAINE 2% (20 MG/ML) 5 ML SYRINGE
INTRAMUSCULAR | Status: DC | PRN
  Administered 2020-06-15: 40 mg via INTRAVENOUS

## 2020-06-15 MED ORDER — DEXAMETHASONE SODIUM PHOSPHATE 10 MG/ML IJ SOLN
INTRAMUSCULAR | Status: AC
  Filled 2020-06-15: qty 1

## 2020-06-15 MED ORDER — ROCURONIUM BROMIDE 10 MG/ML (PF) SYRINGE
PREFILLED_SYRINGE | INTRAVENOUS | Status: AC
  Filled 2020-06-15: qty 10

## 2020-06-15 MED ORDER — PROPOFOL 10 MG/ML IV BOLUS
INTRAVENOUS | Status: AC
  Filled 2020-06-15: qty 20

## 2020-06-15 MED ORDER — 0.9 % SODIUM CHLORIDE (POUR BTL) OPTIME
TOPICAL | Status: DC | PRN
Start: 2020-06-15 — End: 2020-06-15
  Administered 2020-06-15 (×2): 1000 mL

## 2020-06-15 MED ORDER — OXYCODONE HCL 5 MG PO TABS
5.0000 mg | ORAL_TABLET | Freq: Once | ORAL | Status: AC | PRN
  Administered 2020-06-15: 5 mg via ORAL

## 2020-06-15 MED ORDER — LACTATED RINGERS IV SOLN: INTRAVENOUS | Status: DC

## 2020-06-15 MED ORDER — OXYCODONE HCL 5 MG/5ML PO SOLN: 5.0000 mg | Freq: Once | ORAL | Status: AC | PRN

## 2020-06-15 MED ORDER — SUGAMMADEX SODIUM 200 MG/2ML IV SOLN
INTRAVENOUS | Status: DC | PRN
  Administered 2020-06-15: 200 mg via INTRAVENOUS

## 2020-06-15 MED ORDER — PHENYLEPHRINE HCL-NACL 10-0.9 MG/250ML-% IV SOLN
INTRAVENOUS | Status: DC | PRN
  Administered 2020-06-15: 40 ug/min via INTRAVENOUS

## 2020-06-15 MED ORDER — MIDAZOLAM HCL 2 MG/2ML IJ SOLN: 0.5000 mg | Freq: Once | INTRAMUSCULAR | Status: DC | PRN

## 2020-06-15 MED ORDER — CHLORHEXIDINE GLUCONATE 0.12 % MT SOLN: 15.0000 mL | Freq: Once | OROMUCOSAL | Status: AC

## 2020-06-15 MED ORDER — METHOCARBAMOL 500 MG PO TABS: 500.0000 mg | ORAL_TABLET | Freq: Four times a day (QID) | ORAL | 0 refills | Status: DC | PRN

## 2020-06-15 MED ORDER — ONDANSETRON HCL 4 MG/2ML IJ SOLN
INTRAMUSCULAR | Status: DC | PRN
  Administered 2020-06-15: 4 mg via INTRAVENOUS

## 2020-06-15 MED ORDER — HEMOSTATIC AGENTS (NO CHARGE) OPTIME
TOPICAL | Status: DC | PRN
Start: 2020-06-15 — End: 2020-06-15
  Administered 2020-06-15: 1 via TOPICAL

## 2020-06-15 MED ORDER — ACETAMINOPHEN 500 MG PO TABS
ORAL_TABLET | ORAL | Status: AC
  Administered 2020-06-15: 1000 mg via ORAL
  Filled 2020-06-15: qty 2

## 2020-06-15 MED ORDER — ONDANSETRON HCL 4 MG/2ML IJ SOLN
INTRAMUSCULAR | Status: AC
  Filled 2020-06-15: qty 2

## 2020-06-15 MED ORDER — THROMBIN 20000 UNITS EX SOLR
CUTANEOUS | Status: DC | PRN
  Administered 2020-06-15: 20 mL via TOPICAL

## 2020-06-15 MED ORDER — FENTANYL CITRATE (PF) 250 MCG/5ML IJ SOLN
INTRAMUSCULAR | Status: AC
  Filled 2020-06-15: qty 5

## 2020-06-15 MED ORDER — CHLORHEXIDINE GLUCONATE 0.12 % MT SOLN
OROMUCOSAL | Status: AC
  Administered 2020-06-15: 15 mL via OROMUCOSAL
  Filled 2020-06-15: qty 15

## 2020-06-15 MED ORDER — BUPIVACAINE-EPINEPHRINE 0.25% -1:200000 IJ SOLN
INTRAMUSCULAR | Status: DC | PRN
  Administered 2020-06-15: 6 mL

## 2020-06-15 MED ORDER — DEXAMETHASONE SODIUM PHOSPHATE 10 MG/ML IJ SOLN
INTRAMUSCULAR | Status: DC | PRN
  Administered 2020-06-15: 10 mg via INTRAVENOUS

## 2020-06-15 MED ORDER — LIDOCAINE 2% (20 MG/ML) 5 ML SYRINGE
INTRAMUSCULAR | Status: AC
  Filled 2020-06-15: qty 5

## 2020-06-15 MED ORDER — LACTATED RINGERS IV SOLN
INTRAVENOUS | Status: DC | PRN
Start: 2020-06-15 — End: 2020-06-15

## 2020-06-15 MED ORDER — ONDANSETRON HCL 4 MG PO TABS: 4.0000 mg | ORAL_TABLET | Freq: Three times a day (TID) | ORAL | 0 refills | Status: DC | PRN

## 2020-06-15 MED ORDER — LACTATED RINGERS IV SOLN: INTRAVENOUS | Status: DC | PRN

## 2020-06-15 MED ORDER — THROMBIN 20000 UNITS EX SOLR
CUTANEOUS | Status: AC
  Filled 2020-06-15: qty 20000

## 2020-06-15 MED ORDER — FENTANYL CITRATE (PF) 250 MCG/5ML IJ SOLN
INTRAMUSCULAR | Status: DC | PRN
  Administered 2020-06-15: 125 ug via INTRAVENOUS
  Administered 2020-06-15: 50 ug via INTRAVENOUS
  Administered 2020-06-15: 25 ug via INTRAVENOUS

## 2020-06-15 MED ORDER — ORAL CARE MOUTH RINSE: 15.0000 mL | Freq: Once | OROMUCOSAL | Status: AC

## 2020-06-15 MED ORDER — OXYCODONE HCL 5 MG PO TABS
ORAL_TABLET | ORAL | Status: AC
  Filled 2020-06-15: qty 1

## 2020-06-15 SURGICAL SUPPLY — 75 items
AGENT HMST KT MTR STRL THRMB (HEMOSTASIS) ×1
APL SKNCLS STERI-STRIP NONHPOA (GAUZE/BANDAGES/DRESSINGS) ×1
BENZOIN TINCTURE PRP APPL 2/3 (GAUZE/BANDAGES/DRESSINGS) ×2 IMPLANT
BIT DRILL NEURO 2X3.1 SFT TUCH (MISCELLANEOUS) ×1 IMPLANT
BIT DRILL SKYLINE 12MM (BIT) IMPLANT
BLADE CLIPPER SURG (BLADE) ×2 IMPLANT
BLADE SURG 15 STRL LF DISP TIS (BLADE) ×1 IMPLANT
BLADE SURG 15 STRL SS (BLADE) ×2
BONE VIVIGEN FORMABLE 1.3CC (Bone Implant) ×2 IMPLANT
CARTRIDGE OIL MAESTRO DRILL (MISCELLANEOUS) ×1 IMPLANT
CORD BIPOLAR FORCEPS 12FT (ELECTRODE) ×2 IMPLANT
COVER SURGICAL LIGHT HANDLE (MISCELLANEOUS) ×2 IMPLANT
COVER WAND RF STERILE (DRAPES) ×2 IMPLANT
DIFFUSER DRILL AIR PNEUMATIC (MISCELLANEOUS) ×2 IMPLANT
DRAIN JACKSON RD 7FR 3/32 (WOUND CARE) IMPLANT
DRAPE C-ARM 42X72 X-RAY (DRAPES) ×2 IMPLANT
DRAPE POUCH INSTRU U-SHP 10X18 (DRAPES) ×2 IMPLANT
DRAPE SURG 17X23 STRL (DRAPES) ×6 IMPLANT
DRILL BIT SKYLINE 12MM (BIT) ×2
DRILL NEURO 2X3.1 SOFT TOUCH (MISCELLANEOUS) ×2
DURAPREP 26ML APPLICATOR (WOUND CARE) ×2 IMPLANT
ELECT COATED BLADE 2.86 ST (ELECTRODE) ×2 IMPLANT
ELECT REM PT RETURN 9FT ADLT (ELECTROSURGICAL) ×2
ELECTRODE REM PT RTRN 9FT ADLT (ELECTROSURGICAL) ×1 IMPLANT
EVACUATOR SILICONE 100CC (DRAIN) IMPLANT
GAUZE 4X4 16PLY RFD (DISPOSABLE) ×2 IMPLANT
GAUZE SPONGE 4X4 12PLY STRL (GAUZE/BANDAGES/DRESSINGS) ×2 IMPLANT
GLOVE BIO SURGEON STRL SZ7 (GLOVE) ×2 IMPLANT
GLOVE BIO SURGEON STRL SZ8 (GLOVE) ×2 IMPLANT
GLOVE BIOGEL PI IND STRL 7.0 (GLOVE) ×2 IMPLANT
GLOVE BIOGEL PI INDICATOR 7.0 (GLOVE) ×2
GLOVE ECLIPSE 7.0 STRL STRAW (GLOVE) ×1 IMPLANT
GLOVE SRG 8 PF TXTR STRL LF DI (GLOVE) ×1 IMPLANT
GLOVE SURG UNDER POLY LF SZ8 (GLOVE) ×2
GOWN STRL REUS W/ TWL LRG LVL3 (GOWN DISPOSABLE) ×1 IMPLANT
GOWN STRL REUS W/ TWL XL LVL3 (GOWN DISPOSABLE) ×1 IMPLANT
GOWN STRL REUS W/TWL LRG LVL3 (GOWN DISPOSABLE) ×2
GOWN STRL REUS W/TWL XL LVL3 (GOWN DISPOSABLE) ×4
GRAFT BNE MATRIX VG FRMBL SM 1 (Bone Implant) IMPLANT
INTERLOCK LRDTC CRVCL VBR 6MM (Bone Implant) IMPLANT
IV CATH 14GX2 1/4 (CATHETERS) ×2 IMPLANT
KIT BASIN OR (CUSTOM PROCEDURE TRAY) ×2 IMPLANT
KIT TURNOVER KIT B (KITS) ×2 IMPLANT
LORDOTIC CERVICAL VBR 6MM SM (Bone Implant) ×2 IMPLANT
MANIFOLD NEPTUNE II (INSTRUMENTS) ×2 IMPLANT
NDL PRECISIONGLIDE 27X1.5 (NEEDLE) ×1 IMPLANT
NDL SPNL 20GX3.5 QUINCKE YW (NEEDLE) ×1 IMPLANT
NEEDLE PRECISIONGLIDE 27X1.5 (NEEDLE) ×2 IMPLANT
NEEDLE SPNL 20GX3.5 QUINCKE YW (NEEDLE) ×2 IMPLANT
NS IRRIG 1000ML POUR BTL (IV SOLUTION) ×3 IMPLANT
OIL CARTRIDGE MAESTRO DRILL (MISCELLANEOUS) ×2
PACK ORTHO CERVICAL (CUSTOM PROCEDURE TRAY) ×2 IMPLANT
PAD ARMBOARD 7.5X6 YLW CONV (MISCELLANEOUS) ×4 IMPLANT
PATTIES SURGICAL .5 X.5 (GAUZE/BANDAGES/DRESSINGS) IMPLANT
PATTIES SURGICAL .5 X1 (DISPOSABLE) IMPLANT
PIN DISTRACTION 14 (PIN) ×2 IMPLANT
PLATE SKYLINE 12MM (Plate) ×1 IMPLANT
POSITIONER HEAD DONUT 9IN (MISCELLANEOUS) ×2 IMPLANT
SCREW SKYLINE VARIABLE LG (Screw) ×4 IMPLANT
SPONGE INTESTINAL PEANUT (DISPOSABLE) ×2 IMPLANT
SPONGE SURGIFOAM ABS GEL 100 (HEMOSTASIS) IMPLANT
STRIP CLOSURE SKIN 1/2X4 (GAUZE/BANDAGES/DRESSINGS) ×2 IMPLANT
SURGIFLO W/THROMBIN 8M KIT (HEMOSTASIS) ×1 IMPLANT
SUT MNCRL AB 4-0 PS2 18 (SUTURE) ×2 IMPLANT
SUT SILK 4 0 (SUTURE)
SUT SILK 4-0 18XBRD TIE 12 (SUTURE) IMPLANT
SUT VIC AB 2-0 CT2 18 VCP726D (SUTURE) ×2 IMPLANT
SYR BULB IRRIG 60ML STRL (SYRINGE) ×2 IMPLANT
SYR CONTROL 10ML LL (SYRINGE) ×4 IMPLANT
TAPE CLOTH 4X10 WHT NS (GAUZE/BANDAGES/DRESSINGS) ×2 IMPLANT
TAPE UMBILICAL COTTON 1/8X30 (MISCELLANEOUS) ×2 IMPLANT
TOWEL GREEN STERILE (TOWEL DISPOSABLE) ×2 IMPLANT
TOWEL GREEN STERILE FF (TOWEL DISPOSABLE) ×2 IMPLANT
WATER STERILE IRR 1000ML POUR (IV SOLUTION) ×2 IMPLANT
YANKAUER SUCT BULB TIP NO VENT (SUCTIONS) ×2 IMPLANT

## 2020-06-15 NOTE — Anesthesia Postprocedure Evaluation (Signed)
Anesthesia Post Note  Patient: [name] Molina  Procedure(s) Performed: ANTERIOR CERVICAL DECOMPRESSION FUSION CERVICAL FIVE-CERVICAL SIX WITH INSTRUMENTATION AND ALLOGRAFT (N/A Spine Cervical)     Patient location during evaluation: PACU Anesthesia Type: General Level of consciousness: awake and alert, patient cooperative and oriented Pain management: pain level controlled Vital Signs Assessment: post-procedure vital signs reviewed and stable Respiratory status: spontaneous breathing, nonlabored ventilation and respiratory function stable Cardiovascular status: blood pressure returned to baseline and stable Postop Assessment: no apparent nausea or vomiting Anesthetic complications: no   No complications documented.  Last Vitals:  Vitals:   06/15/20 1030 06/15/20 1045  BP: 139/70 134/62  Pulse: (!) 106 (!) 101  Resp: 15 15  Temp: (!) 36.1 C (!) 36.1 C  SpO2: 92% 93%    Last Pain:  Vitals:   06/15/20 1045  TempSrc:   PainSc: 4                  Wilna Pennie,E. Viann Nielson

## 2020-06-15 NOTE — Op Note (Signed)
PATIENT NAME: [name] Molina   MEDICAL RECORD NO.:   [id number]    DATE OF BIRTH: 1945/07/02   DATE OF PROCEDURE: 06/15/2020                               OPERATIVE REPORT     PREOPERATIVE DIAGNOSES: 1. Progressive cervical myelopathy 2. Spinal stenosis, C5/6   POSTOPERATIVE DIAGNOSES: 1. Progressive cervical myelopathy 2. Spinal stenosis, C5/6   PROCEDURE: 1. Anterior cervical decompression and fusion C5/6 2. Placement of anterior instrumentation, C5/6 3. Insertion of interbody device x1 (29mm Titan intervertebral spacer). 4. Intraoperative use of fluoroscopy. 5. Use of morselized allograft - ViviGen.   SURGEON:  Phylliss Bob, MD   ASSISTANT:  Pricilla Holm, PA-C.   ANESTHESIA:  General endotracheal anesthesia.   COMPLICATIONS:  None.   DISPOSITION:  Stable.   ESTIMATED BLOOD LOSS:  Minimal.   INDICATIONS FOR SURGERY:  Briefly, Ms. Knodel  is a pleasant 21 -year- old female, who did present to me with progressive deterioration in balance and fine motor skills.   The patient's MRI did reveal the findings noted above.  Given the ongoing and progressive symptoms, we did discuss proceeding with the procedure noted above.  The patient was fully aware of the risks and limitations of surgery as outlined in my preoperative note.   OPERATIVE DETAILS:  On 06/15/2020, the patient was brought to surgery and general endotracheal anesthesia was administered.  The patient was placed supine on the hospital bed. The neck was gently extended.  All bony prominences were meticulously padded.  The neck was prepped and draped in the usual sterile fashion.  At this point, I did make a left-sided transverse incision.  The platysma was incised.  A Smith-Robinson approach was used and the anterior spine was identified. A self-retaining retractor was placed.  I then subperiosteally exposed the vertebral bodies from C5-6.  Caspar pins were then placed into the C5 and C6 vertebral bodies and  distraction was applied.  A thorough and complete C5-6 intervertebral diskectomy was performed.  The posterior longitudinal ligament was identified and entered using a nerve hook.  I then used #1 followed by #2 Kerrison to perform a thorough and complete intervertebral diskectomy.  The spinal canal was thoroughly decompressed, as was the left and right neuroforamen.  The endplates were then prepared and the appropriate-sized intervertebral spacer was then packed with ViviGen and tamped into position in the usual fashion. The Caspar pins  then were removed and bone wax was placed in their place.  The appropriate-sized anterior cervical plate was placed over the anterior spine.  12 mm variable angle screws were placed, 2 in each vertebral body from C5-6 for a total of 4 vertebral body screws.  The screws were then locked to the plate using the Cam locking mechanism.  I was very pleased with the final fluoroscopic images.  The wound was then irrigated.  The wound was then explored for any undue bleeding and there was no bleeding noted. The wound was then closed in layers using 2-0 Vicryl, followed by 4-0 Monocryl.  Benzoin and Steri-Strips were applied, followed by sterile dressing.  All instrument counts were correct at the termination of the procedure.   Of note, Pricilla Holm, PA-C, was my assistant throughout surgery, and did aid in retraction, placement of the hardware, suctioning, and closure from start to finish.      Phylliss Bob, MD

## 2020-06-15 NOTE — Anesthesia Procedure Notes (Signed)
Procedure Name: Intubation Date/Time: 06/15/2020 7:47 AM Performed by: Harden Mo, CRNA Pre-anesthesia Checklist: Patient identified, Emergency Drugs available, Suction available and Patient being monitored Patient Re-evaluated:Patient Re-evaluated prior to induction Oxygen Delivery Method: Circle system utilized Preoxygenation: Pre-oxygenation with 100% oxygen Induction Type: IV induction Ventilation: Mask ventilation without difficulty and Oral airway inserted - appropriate to patient size Laryngoscope Size: Glidescope and 3 Grade View: Grade I Tube type: Oral Tube size: 7.0 mm Number of attempts: 1 Airway Equipment and Method: Stylet and Oral airway Placement Confirmation: ETT inserted through vocal cords under direct vision,  positive ETCO2 and breath sounds checked- equal and bilateral Tube secured with: Tape Dental Injury: Teeth and Oropharynx as per pre-operative assessment  Comments: Head and neck in neutral position throughout induction

## 2020-06-15 NOTE — Transfer of Care (Signed)
Immediate Anesthesia Transfer of Care Note  Patient: [name] Molina  Procedure(s) Performed: ANTERIOR CERVICAL DECOMPRESSION FUSION CERVICAL FIVE-CERVICAL SIX WITH INSTRUMENTATION AND ALLOGRAFT (N/A Spine Cervical)  Patient Location: PACU  Anesthesia Type:General  Level of Consciousness: awake and alert   Airway & Oxygen Therapy: Patient Spontanous Breathing and Patient connected to face mask oxygen  Post-op Assessment: Report given to RN, Post -op Vital signs reviewed and stable and Patient moving all extremities X 4  Post vital signs: Reviewed and stable  Last Vitals:  Vitals Value Taken Time  BP 138/81 06/15/20 0944  Temp    Pulse 101 06/15/20 0945  Resp 11 06/15/20 0945  SpO2 100 % 06/15/20 0945  Vitals shown include unvalidated device data.  Last Pain:  Vitals:   06/15/20 0637  TempSrc:   PainSc: 0-No pain      Patients Stated Pain Goal: 3 (13/24/40 1027)  Complications: No complications documented.

## 2020-06-15 NOTE — H&P (Signed)
PREOPERATIVE H&P  Chief Complaint: Balance deterioratoin  HPI: [name] Molina is a 75 y.o. female who presents with ongoing deterioration in balance  MRI reveals spinal cord compression at C5/6  Patient has failed multiple forms of conservative care and continues to have pain (see office notes for additional details regarding the patient's full course of treatment)  Past Medical History:  Diagnosis Date  . Anxiety   . Arthritis   . Asthma   . Atrial tachycardia ([place])   . Cancer ([place])    basal skin cancer on nose  . Cataract   . CHF (congestive heart failure) ([place])   . Complication of anesthesia    during cervical surgery she woke up during the procedure  . COPD (chronic obstructive pulmonary disease) ([place])   . Depression   . Diverticulosis   . Family history of adverse reaction to anesthesia    mother had post op N&V  . GERD (gastroesophageal reflux disease)   . History of hiatal hernia   . Hyperlipidemia   . Hypertension   . Insomnia   . Macular degeneration   . Migraines   . Neuropathy   . Pneumonia   . Sleep apnea    history of it,not using a cpap, study 2018: didn't need cpap  . Thyroid nodule 2000   radioactive caspule   Past Surgical History:  Procedure Laterality Date  . CERVICAL POLYPECTOMY     INSIDE AND OUTSIDE  . COLONOSCOPY    . EYE SURGERY Bilateral    cataracts  . POLYPECTOMY    . REMOVED CARTLIAGE RIB     Social History   Socioeconomic History  . Marital status: Divorced    Spouse name: Not on file  . Number of children: 3  . Years of education: College  . Highest education level: Not on file  Occupational History  . Occupation: Emergency planning/management officer  Tobacco Use  . Smoking status: Former Smoker    Packs/day: 1.00    Years: 20.00    Pack years: 20.00    Types: Cigarettes    Quit date: 05/21/1991    Years since quitting: 29.0  . Smokeless tobacco: Never Used  Vaping Use  . Vaping Use: Never used  Substance and Sexual Activity   . Alcohol use: No  . Drug use: No  . Sexual activity: Not on file  Other Topics Concern  . Not on file  Social History Narrative   Patient is divorced with 3 children.   Patient is right handed.   Patient has a college education.   Patient drinks 1-2 servings daily.   Social Determinants of Health   Financial Resource Strain: Not on file  Food Insecurity: Not on file  Transportation Needs: Not on file  Physical Activity: Not on file  Stress: Not on file  Social Connections: Not on file   Family History  Problem Relation Age of Onset  . Colon polyps Sister 51       PRECANCEROUS POLYPS  . Breast cancer Maternal Grandmother 59  . Emphysema Father   . Asthma Father   . Heart disease Father   . Heart disease Paternal Grandfather   . Colon cancer Neg Hx    Allergies  Allergen Reactions  . Food Shortness Of Breath    ALLERGY= MUSHROOMS  . Tequin Shortness Of Breath  . Codeine Nausea And Vomiting  . Erythromycin Other (See Comments)    GI UPSET  . Levaquin [Levofloxacin In D5w] Other (  See Comments)    "made me want to die."  . Wellbutrin [Bupropion Hcl] Other (See Comments)    SHAKING  . Penicillins Rash    Reaction: Childhood  . Prednisone Rash   Prior to Admission medications   Medication Sig Start Date End Date Taking? Authorizing Provider  acetaminophen (TYLENOL) 650 MG CR tablet Take 1,300 mg by mouth every 8 (eight) hours as needed for pain.   Yes [provider]  BESIVANCE 0.6 % SUSP Place 1 drop into the left eye See admin instructions. Instill 1 drop qid day of eye injection and qid the day after. 09/07/19  Yes [provider]  Cyanocobalamin (VITAMIN B-12 PO) Take 2,500 mcg by mouth daily.   Yes [provider]  DULoxetine (CYMBALTA) 60 MG capsule Take 60 mg by mouth every morning. 03/23/20  Yes [provider]  fluticasone (FLONASE) 50 MCG/ACT nasal spray Place 1 spray into both nostrils daily. Patient taking differently:  Place 1 spray into both nostrils 2 (two) times daily. 07/19/16  Yes Collene Gobble, MD  furosemide (LASIX) 40 MG tablet Take 1 tablet (40 mg total) by mouth daily. 09/18/19 12/17/19 Yes [place] Eric J, DO  loratadine-pseudoephedrine (CLARITIN-D 12-HOUR) 5-120 MG per tablet Take 1 tablet by mouth daily.   Yes [provider]  metoprolol succinate (TOPROL-XL) 50 MG 24 hr tablet Take 1 tablet (50 mg total) by mouth daily. Take with or immediately following a meal. Patient taking differently: Take 50 mg by mouth at bedtime. Take with or immediately following a meal. 11/24/19  Yes Skains, Thana Farr, MD  montelukast (SINGULAIR) 10 MG tablet Take 10 mg by mouth at bedtime. 02/21/14  Yes [provider]  Multiple Vitamins-Minerals (OCUVITE PO) Take 1 tablet by mouth 2 (two) times daily.   Yes [provider]  omeprazole (PRILOSEC) 20 MG capsule Take 20 mg by mouth at bedtime.   Yes [provider]  pravastatin (PRAVACHOL) 40 MG tablet Take 40 mg by mouth at bedtime.  09/04/10  Yes [provider]  TRELEGY ELLIPTA 100-62.5-25 MCG/INH AEPB INHALE 1 PUFF BY MOUTH EVERY DAY Patient taking differently: Inhale 1 puff into the lungs every morning. 02/04/20  Yes Collene Gobble, MD  albuterol (PROVENTIL HFA;VENTOLIN HFA) 108 (90 Base) MCG/ACT inhaler Inhale 2 puffs into the lungs every 6 (six) hours as needed for wheezing or shortness of breath. 03/05/18   Collene Gobble, MD  aspirin 81 MG tablet Take 81 mg by mouth at bedtime.    [provider]  ELIQUIS 5 MG TABS tablet TAKE ONE TABLET BY MOUTH EVERY MORNING and TAKE ONE TABLET BY MOUTH EVERYDAY AT BEDTIME 05/23/20   Jerline Pain, MD  LORazepam (ATIVAN) 0.5 MG tablet Take 0.5-1 mg by mouth daily as needed for anxiety. Anxiety    [provider]  tizanidine (ZANAFLEX) 2 MG capsule Take 2 mg by mouth at bedtime as needed for muscle spasms (head pain per pt).    [provider]     All other systems  have been reviewed and were otherwise negative with the exception of those mentioned in the HPI and as above.  Physical Exam: Vitals:   06/15/20 0612  BP: 129/82  Pulse: (!) 113  Resp: 18  Temp: 97.9 F (36.6 C)  SpO2: 94%    Body mass index is 40.53 kg/m.  General: Alert, no acute distress Cardiovascular: No pedal edema Respiratory: No cyanosis, no use of accessory musculature Skin: No lesions  in the area of chief complaint Neurologic: Sensation intact distally Psychiatric: Patient is competent for consent with normal mood and affect Lymphatic: No axillary or cervical lymphadenopathy   Assessment/Plan: PROGRESSIVE CERVICAL MYELOPATHY Plan for Procedure(s): ANTERIOR CERVICAL DECOMPRESSION FUSION CERVICAL 5-CERVICAL 6 WITH INSTRUMENTATION AND ALLOGRAFT   Norva Karvonen, MD 06/15/2020 7:15 AM

## 2020-06-16 ENCOUNTER — Encounter (HOSPITAL_COMMUNITY): Payer: Self-pay | Admitting: Orthopedic Surgery

## 2020-06-16 DIAGNOSIS — I4891 Unspecified atrial fibrillation: Secondary | ICD-10-CM | POA: Diagnosis not present

## 2020-06-16 DIAGNOSIS — R269 Unspecified abnormalities of gait and mobility: Secondary | ICD-10-CM | POA: Diagnosis not present

## 2020-06-16 DIAGNOSIS — Z4789 Encounter for other orthopedic aftercare: Secondary | ICD-10-CM | POA: Diagnosis not present

## 2020-06-16 DIAGNOSIS — M4322 Fusion of spine, cervical region: Secondary | ICD-10-CM | POA: Diagnosis not present

## 2020-06-19 DIAGNOSIS — J449 Chronic obstructive pulmonary disease, unspecified: Secondary | ICD-10-CM | POA: Diagnosis not present

## 2020-06-19 DIAGNOSIS — I10 Essential (primary) hypertension: Secondary | ICD-10-CM | POA: Diagnosis not present

## 2020-06-19 DIAGNOSIS — I700 Atherosclerosis of aorta: Secondary | ICD-10-CM | POA: Diagnosis not present

## 2020-06-19 DIAGNOSIS — E785 Hyperlipidemia, unspecified: Secondary | ICD-10-CM | POA: Diagnosis not present

## 2020-06-19 DIAGNOSIS — I480 Paroxysmal atrial fibrillation: Secondary | ICD-10-CM | POA: Diagnosis not present

## 2020-06-19 DIAGNOSIS — D6869 Other thrombophilia: Secondary | ICD-10-CM | POA: Diagnosis not present

## 2020-06-19 DIAGNOSIS — J4520 Mild intermittent asthma, uncomplicated: Secondary | ICD-10-CM | POA: Diagnosis not present

## 2020-06-19 DIAGNOSIS — F331 Major depressive disorder, recurrent, moderate: Secondary | ICD-10-CM | POA: Diagnosis not present

## 2020-06-22 DIAGNOSIS — M4322 Fusion of spine, cervical region: Secondary | ICD-10-CM | POA: Diagnosis not present

## 2020-06-22 DIAGNOSIS — R269 Unspecified abnormalities of gait and mobility: Secondary | ICD-10-CM | POA: Diagnosis not present

## 2020-06-22 DIAGNOSIS — I4891 Unspecified atrial fibrillation: Secondary | ICD-10-CM | POA: Diagnosis not present

## 2020-06-22 DIAGNOSIS — Z4789 Encounter for other orthopedic aftercare: Secondary | ICD-10-CM | POA: Diagnosis not present

## 2020-06-27 DIAGNOSIS — Z4789 Encounter for other orthopedic aftercare: Secondary | ICD-10-CM | POA: Diagnosis not present

## 2020-06-27 DIAGNOSIS — I4891 Unspecified atrial fibrillation: Secondary | ICD-10-CM | POA: Diagnosis not present

## 2020-06-27 DIAGNOSIS — M4322 Fusion of spine, cervical region: Secondary | ICD-10-CM | POA: Diagnosis not present

## 2020-06-27 DIAGNOSIS — R269 Unspecified abnormalities of gait and mobility: Secondary | ICD-10-CM | POA: Diagnosis not present

## 2020-06-29 DIAGNOSIS — R269 Unspecified abnormalities of gait and mobility: Secondary | ICD-10-CM | POA: Diagnosis not present

## 2020-06-29 DIAGNOSIS — Z4789 Encounter for other orthopedic aftercare: Secondary | ICD-10-CM | POA: Diagnosis not present

## 2020-06-29 DIAGNOSIS — M4322 Fusion of spine, cervical region: Secondary | ICD-10-CM | POA: Diagnosis not present

## 2020-06-29 DIAGNOSIS — I4891 Unspecified atrial fibrillation: Secondary | ICD-10-CM | POA: Diagnosis not present

## 2020-07-06 DIAGNOSIS — R269 Unspecified abnormalities of gait and mobility: Secondary | ICD-10-CM | POA: Diagnosis not present

## 2020-07-06 DIAGNOSIS — Z4789 Encounter for other orthopedic aftercare: Secondary | ICD-10-CM | POA: Diagnosis not present

## 2020-07-06 DIAGNOSIS — I4891 Unspecified atrial fibrillation: Secondary | ICD-10-CM | POA: Diagnosis not present

## 2020-07-06 DIAGNOSIS — M4322 Fusion of spine, cervical region: Secondary | ICD-10-CM | POA: Diagnosis not present

## 2020-07-14 DIAGNOSIS — E785 Hyperlipidemia, unspecified: Secondary | ICD-10-CM | POA: Diagnosis not present

## 2020-07-14 DIAGNOSIS — J4520 Mild intermittent asthma, uncomplicated: Secondary | ICD-10-CM | POA: Diagnosis not present

## 2020-07-14 DIAGNOSIS — J449 Chronic obstructive pulmonary disease, unspecified: Secondary | ICD-10-CM | POA: Diagnosis not present

## 2020-07-14 DIAGNOSIS — M19042 Primary osteoarthritis, left hand: Secondary | ICD-10-CM | POA: Diagnosis not present

## 2020-07-14 DIAGNOSIS — M19041 Primary osteoarthritis, right hand: Secondary | ICD-10-CM | POA: Diagnosis not present

## 2020-07-14 DIAGNOSIS — G43009 Migraine without aura, not intractable, without status migrainosus: Secondary | ICD-10-CM | POA: Diagnosis not present

## 2020-07-14 DIAGNOSIS — D509 Iron deficiency anemia, unspecified: Secondary | ICD-10-CM | POA: Diagnosis not present

## 2020-07-14 DIAGNOSIS — I480 Paroxysmal atrial fibrillation: Secondary | ICD-10-CM | POA: Diagnosis not present

## 2020-07-14 DIAGNOSIS — K219 Gastro-esophageal reflux disease without esophagitis: Secondary | ICD-10-CM | POA: Diagnosis not present

## 2020-07-14 DIAGNOSIS — F329 Major depressive disorder, single episode, unspecified: Secondary | ICD-10-CM | POA: Diagnosis not present

## 2020-07-14 DIAGNOSIS — I10 Essential (primary) hypertension: Secondary | ICD-10-CM | POA: Diagnosis not present

## 2020-07-28 DIAGNOSIS — Z981 Arthrodesis status: Secondary | ICD-10-CM | POA: Diagnosis not present

## 2020-07-28 DIAGNOSIS — Z4789 Encounter for other orthopedic aftercare: Secondary | ICD-10-CM | POA: Diagnosis not present

## 2020-07-28 DIAGNOSIS — Z9889 Other specified postprocedural states: Secondary | ICD-10-CM | POA: Diagnosis not present

## 2020-08-10 ENCOUNTER — Telehealth: Payer: Self-pay | Admitting: Emergency Medicine

## 2020-08-10 NOTE — Telephone Encounter (Signed)
Spoke with the pt and notified of response per RB  She verbalized understanding  Nothing further needed 

## 2020-08-10 NOTE — Telephone Encounter (Signed)
I'm glad that her COVID is negative. In absence of sputum and wheezing, I think she should try using OTC decongestants like Tylenol Cold and flu, etc. She should also continue her allergy meds as she is taking. This sounds like a URI, should be better in 7-10 days. She needs to call to let us know is she is not improving.

## 2020-08-10 NOTE — Telephone Encounter (Signed)
Spoke with the pt  She c/o runny nose x 3 days  She also has had increased cough- very hard to produce sputum and when she does it's very little- clear with light green  She states that she is not having any wheezing, increased SOB, f/c/s, aches  She is taking claritin and still on singulair and trelegy  She has not had to use her rescue inhaler  She took at home covid test 2 days and and it was neg  Had J&J vax April 2021, no booster yet  Please advise, thank you  Allergies  Allergen Reactions  . Food Shortness Of Breath    ALLERGY= MUSHROOMS  . Tequin Shortness Of Breath  . Codeine Nausea And Vomiting  . Erythromycin Other (See Comments)    GI UPSET  . Levaquin [Levofloxacin In D5w] Other (See Comments)    "made me want to die."  . Wellbutrin [Bupropion Hcl] Other (See Comments)    SHAKING  . Penicillins Rash    Reaction: Childhood  . Prednisone Rash

## 2020-08-13 ENCOUNTER — Encounter (HOSPITAL_COMMUNITY): Payer: Self-pay | Admitting: Internal Medicine

## 2020-08-13 ENCOUNTER — Emergency Department (HOSPITAL_COMMUNITY): Payer: Medicare Other

## 2020-08-13 ENCOUNTER — Inpatient Hospital Stay (HOSPITAL_COMMUNITY)
Admission: EM | Admit: 2020-08-13 | Discharge: 2020-08-16 | DRG: 871 | Disposition: A | Discharge status: Home Health | Payer: Medicare Other | Attending: Internal Medicine | Admitting: Internal Medicine

## 2020-08-13 DIAGNOSIS — R7989 Other specified abnormal findings of blood chemistry: Secondary | ICD-10-CM | POA: Diagnosis present

## 2020-08-13 DIAGNOSIS — G43909 Migraine, unspecified, not intractable, without status migrainosus: Secondary | ICD-10-CM | POA: Diagnosis present

## 2020-08-13 DIAGNOSIS — I480 Paroxysmal atrial fibrillation: Secondary | ICD-10-CM | POA: Diagnosis not present

## 2020-08-13 DIAGNOSIS — Z8371 Family history of colonic polyps: Secondary | ICD-10-CM

## 2020-08-13 DIAGNOSIS — R079 Chest pain, unspecified: Secondary | ICD-10-CM | POA: Diagnosis not present

## 2020-08-13 DIAGNOSIS — M1990 Unspecified osteoarthritis, unspecified site: Secondary | ICD-10-CM | POA: Diagnosis present

## 2020-08-13 DIAGNOSIS — J9601 Acute respiratory failure with hypoxia: Secondary | ICD-10-CM | POA: Diagnosis not present

## 2020-08-13 DIAGNOSIS — I5032 Chronic diastolic (congestive) heart failure: Secondary | ICD-10-CM | POA: Diagnosis not present

## 2020-08-13 DIAGNOSIS — Z8249 Family history of ischemic heart disease and other diseases of the circulatory system: Secondary | ICD-10-CM

## 2020-08-13 DIAGNOSIS — J9811 Atelectasis: Secondary | ICD-10-CM | POA: Diagnosis not present

## 2020-08-13 DIAGNOSIS — J441 Chronic obstructive pulmonary disease with (acute) exacerbation: Secondary | ICD-10-CM | POA: Diagnosis not present

## 2020-08-13 DIAGNOSIS — Z20822 Contact with and (suspected) exposure to covid-19: Secondary | ICD-10-CM | POA: Diagnosis not present

## 2020-08-13 DIAGNOSIS — I5033 Acute on chronic diastolic (congestive) heart failure: Secondary | ICD-10-CM | POA: Diagnosis present

## 2020-08-13 DIAGNOSIS — J189 Pneumonia, unspecified organism: Secondary | ICD-10-CM | POA: Diagnosis present

## 2020-08-13 DIAGNOSIS — Z87891 Personal history of nicotine dependence: Secondary | ICD-10-CM

## 2020-08-13 DIAGNOSIS — E785 Hyperlipidemia, unspecified: Secondary | ICD-10-CM | POA: Diagnosis present

## 2020-08-13 DIAGNOSIS — F329 Major depressive disorder, single episode, unspecified: Secondary | ICD-10-CM | POA: Diagnosis not present

## 2020-08-13 DIAGNOSIS — R778 Other specified abnormalities of plasma proteins: Secondary | ICD-10-CM | POA: Diagnosis present

## 2020-08-13 DIAGNOSIS — A419 Sepsis, unspecified organism: Secondary | ICD-10-CM | POA: Diagnosis not present

## 2020-08-13 DIAGNOSIS — R0789 Other chest pain: Secondary | ICD-10-CM | POA: Diagnosis not present

## 2020-08-13 DIAGNOSIS — Z91018 Allergy to other foods: Secondary | ICD-10-CM

## 2020-08-13 DIAGNOSIS — E782 Mixed hyperlipidemia: Secondary | ICD-10-CM

## 2020-08-13 DIAGNOSIS — Z825 Family history of asthma and other chronic lower respiratory diseases: Secondary | ICD-10-CM

## 2020-08-13 DIAGNOSIS — Z881 Allergy status to other antibiotic agents status: Secondary | ICD-10-CM

## 2020-08-13 DIAGNOSIS — Z66 Do not resuscitate: Secondary | ICD-10-CM | POA: Diagnosis present

## 2020-08-13 DIAGNOSIS — R059 Cough, unspecified: Secondary | ICD-10-CM | POA: Diagnosis not present

## 2020-08-13 DIAGNOSIS — E869 Volume depletion, unspecified: Secondary | ICD-10-CM | POA: Diagnosis present

## 2020-08-13 DIAGNOSIS — I110 Hypertensive heart disease with heart failure: Secondary | ICD-10-CM | POA: Diagnosis present

## 2020-08-13 DIAGNOSIS — Z888 Allergy status to other drugs, medicaments and biological substances status: Secondary | ICD-10-CM

## 2020-08-13 DIAGNOSIS — R0602 Shortness of breath: Secondary | ICD-10-CM | POA: Diagnosis not present

## 2020-08-13 DIAGNOSIS — E872 Acidosis: Secondary | ICD-10-CM | POA: Diagnosis present

## 2020-08-13 DIAGNOSIS — G4730 Sleep apnea, unspecified: Secondary | ICD-10-CM | POA: Diagnosis present

## 2020-08-13 DIAGNOSIS — R000 Tachycardia, unspecified: Secondary | ICD-10-CM | POA: Diagnosis not present

## 2020-08-13 DIAGNOSIS — Z981 Arthrodesis status: Secondary | ICD-10-CM

## 2020-08-13 DIAGNOSIS — K219 Gastro-esophageal reflux disease without esophagitis: Secondary | ICD-10-CM | POA: Diagnosis present

## 2020-08-13 DIAGNOSIS — F32A Depression, unspecified: Secondary | ICD-10-CM | POA: Diagnosis not present

## 2020-08-13 DIAGNOSIS — Z803 Family history of malignant neoplasm of breast: Secondary | ICD-10-CM

## 2020-08-13 DIAGNOSIS — Z85828 Personal history of other malignant neoplasm of skin: Secondary | ICD-10-CM

## 2020-08-13 DIAGNOSIS — Z880 Allergy status to penicillin: Secondary | ICD-10-CM

## 2020-08-13 LAB — COMPREHENSIVE METABOLIC PANEL
ALT: 17 U/L (ref 0–44)
ALT: 14 U/L (ref 0–44)
AST: 24 U/L (ref 15–41)
AST: 20 U/L (ref 15–41)
Albumin: 3.7 g/dL (ref 3.5–5.0)
Albumin: 3.1 g/dL — ABNORMAL LOW (ref 3.5–5.0)
Alkaline Phosphatase: 65 U/L (ref 38–126)
Alkaline Phosphatase: 56 U/L (ref 38–126)
Anion gap: 11 (ref 5–15)
Anion gap: 8 (ref 5–15)
BUN: 12 mg/dL (ref 8–23)
BUN: 13 mg/dL (ref 8–23)
CO2: 22 mmol/L (ref 22–32)
CO2: 24 mmol/L (ref 22–32)
Calcium: 8.6 mg/dL — ABNORMAL LOW (ref 8.9–10.3)
Calcium: 8.1 mg/dL — ABNORMAL LOW (ref 8.9–10.3)
Chloride: 107 mmol/L (ref 98–111)
Chloride: 110 mmol/L (ref 98–111)
Creatinine, Ser: 0.8 mg/dL (ref 0.44–1.00)
Creatinine, Ser: 0.87 mg/dL (ref 0.44–1.00)
GFR, Estimated: >60 mL/min (ref >60)
GFR, Estimated: >60 mL/min (ref >60)
Glucose, Bld: 143 mg/dL — ABNORMAL HIGH (ref 70–99)
Glucose, Bld: 118 mg/dL — ABNORMAL HIGH (ref 70–99)
Potassium: 3.1 mmol/L — ABNORMAL LOW (ref 3.5–5.1)
Potassium: 3.5 mmol/L (ref 3.5–5.1)
Sodium: 140 mmol/L (ref 135–145)
Sodium: 142 mmol/L (ref 135–145)
Total Bilirubin: 1 mg/dL (ref 0.3–1.2)
Total Bilirubin: 0.9 mg/dL (ref 0.3–1.2)
Total Protein: 6.3 g/dL — ABNORMAL LOW (ref 6.5–8.1)
Total Protein: 5.4 g/dL — ABNORMAL LOW (ref 6.5–8.1)

## 2020-08-13 LAB — EXPECTORATED SPUTUM ASSESSMENT W GRAM STAIN, RFLX TO RESP C

## 2020-08-13 LAB — CBC WITH DIFFERENTIAL/PLATELET
Abs Immature Granulocytes: 0.07 10*3/uL (ref 0.00–0.07)
Abs Immature Granulocytes: 0.06 10*3/uL (ref 0.00–0.07)
Basophils Absolute: 0 10*3/uL (ref 0.0–0.1)
Basophils Absolute: 0.1 10*3/uL (ref 0.0–0.1)
Basophils Relative: 0 %
Basophils Relative: 0 %
Eosinophils Absolute: 0 10*3/uL (ref 0.0–0.5)
Eosinophils Absolute: 0 10*3/uL (ref 0.0–0.5)
Eosinophils Relative: 0 %
Eosinophils Relative: 0 %
HCT: 42.1 % (ref 36.0–46.0)
HCT: 35.5 % — ABNORMAL LOW (ref 36.0–46.0)
Hemoglobin: 13.6 g/dL (ref 12.0–15.0)
Hemoglobin: 12 g/dL (ref 12.0–15.0)
Immature Granulocytes: 1 %
Immature Granulocytes: 1 %
Lymphocytes Relative: 4 %
Lymphocytes Relative: 8 %
Lymphs Abs: 0.4 10*3/uL — ABNORMAL LOW (ref 0.7–4.0)
Lymphs Abs: 0.9 10*3/uL (ref 0.7–4.0)
MCH: 27.6 pg (ref 26.0–34.0)
MCH: 28.6 pg (ref 26.0–34.0)
MCHC: 32.3 g/dL (ref 30.0–36.0)
MCHC: 33.8 g/dL (ref 30.0–36.0)
MCV: 85.6 fL (ref 80.0–100.0)
MCV: 84.7 fL (ref 80.0–100.0)
Monocytes Absolute: 0.7 10*3/uL (ref 0.1–1.0)
Monocytes Absolute: 0.9 10*3/uL (ref 0.1–1.0)
Monocytes Relative: 6 %
Monocytes Relative: 8 %
Neutro Abs: 10.4 10*3/uL — ABNORMAL HIGH (ref 1.7–7.7)
Neutro Abs: 9.4 10*3/uL — ABNORMAL HIGH (ref 1.7–7.7)
Neutrophils Relative %: 89 %
Neutrophils Relative %: 83 %
Platelets: 178 10*3/uL (ref 150–400)
Platelets: 178 10*3/uL (ref 150–400)
RBC: 4.92 MIL/uL (ref 3.87–5.11)
RBC: 4.19 MIL/uL (ref 3.87–5.11)
RDW: 15.3 % (ref 11.5–15.5)
RDW: 15.5 % (ref 11.5–15.5)
WBC: 11.7 10*3/uL — ABNORMAL HIGH (ref 4.0–10.5)
WBC: 11.4 10*3/uL — ABNORMAL HIGH (ref 4.0–10.5)
nRBC: 0 % (ref 0.0–0.2)
nRBC: 0 % (ref 0.0–0.2)

## 2020-08-13 LAB — RESP PANEL BY RT-PCR (FLU A&B, COVID) ARPGX2
Influenza A by PCR: NEGATIVE
Influenza B by PCR: NEGATIVE
SARS Coronavirus 2 by RT PCR: NEGATIVE

## 2020-08-13 LAB — LACTIC ACID, PLASMA
Lactic Acid, Venous: 2.6 mmol/L (ref 0.5–1.9)
Lactic Acid, Venous: 2 mmol/L (ref 0.5–1.9)
Lactic Acid, Venous: 1.8 mmol/L (ref 0.5–1.9)

## 2020-08-13 LAB — D-DIMER, QUANTITATIVE: D-Dimer, Quant: <0.27 ug/mL-FEU (ref 0.00–0.50)

## 2020-08-13 LAB — SALICYLATE LEVEL: Salicylate Lvl: <7 mg/dL — ABNORMAL LOW (ref 7.0–30.0)

## 2020-08-13 LAB — TROPONIN I (HIGH SENSITIVITY)
Troponin I (High Sensitivity): 16 ng/L (ref <18)
Troponin I (High Sensitivity): 19 ng/L — ABNORMAL HIGH (ref <18)

## 2020-08-13 LAB — CORTISOL-AM, BLOOD: Cortisol - AM: 5.6 ug/dL — ABNORMAL LOW (ref 6.7–22.6)

## 2020-08-13 LAB — ACETAMINOPHEN LEVEL: Acetaminophen (Tylenol), Serum: 19 ug/mL (ref 10–30)

## 2020-08-13 LAB — PROTIME-INR
INR: 1.6 — ABNORMAL HIGH (ref 0.8–1.2)
Prothrombin Time: 18 seconds — ABNORMAL HIGH (ref 11.4–15.2)

## 2020-08-13 LAB — MAGNESIUM: Magnesium: 1.6 mg/dL — ABNORMAL LOW (ref 1.7–2.4)

## 2020-08-13 LAB — HIV ANTIBODY (ROUTINE TESTING W REFLEX): HIV Screen 4th Generation wRfx: NONREACTIVE

## 2020-08-13 LAB — PROCALCITONIN: Procalcitonin: 0.25 ng/mL

## 2020-08-13 LAB — BRAIN NATRIURETIC PEPTIDE: B Natriuretic Peptide: 135.2 pg/mL — ABNORMAL HIGH (ref 0.0–100.0)

## 2020-08-13 MED ORDER — UMECLIDINIUM BROMIDE 62.5 MCG/INH IN AEPB
1.0000 | INHALATION_SPRAY | Freq: Every day | RESPIRATORY_TRACT | Status: DC
  Administered 2020-08-13 – 2020-08-14 (×2): 1 via RESPIRATORY_TRACT
  Filled 2020-08-13: qty 7

## 2020-08-13 MED ORDER — VITAMIN B-12 1000 MCG PO TABS: 2500.0000 ug | ORAL_TABLET | Freq: Every day | ORAL | Status: DC

## 2020-08-13 MED ORDER — VITAMIN B-12 100 MCG PO TABS
100.0000 ug | ORAL_TABLET | Freq: Every day | ORAL | Status: DC
  Administered 2020-08-13 – 2020-08-16 (×4): 100 ug via ORAL
  Filled 2020-08-13 (×4): qty 1

## 2020-08-13 MED ORDER — DULOXETINE HCL 60 MG PO CPEP
60.0000 mg | ORAL_CAPSULE | Freq: Every morning | ORAL | Status: DC
  Administered 2020-08-13 – 2020-08-16 (×4): 60 mg via ORAL
  Filled 2020-08-13 (×4): qty 1

## 2020-08-13 MED ORDER — SODIUM CHLORIDE 0.9 % IV SOLN
2.0000 g | INTRAVENOUS | Status: DC
  Administered 2020-08-13 – 2020-08-16 (×4): 2 g via INTRAVENOUS
  Filled 2020-08-13 (×2): qty 2
  Filled 2020-08-13 (×3): qty 20

## 2020-08-13 MED ORDER — METOPROLOL SUCCINATE ER 50 MG PO TB24
50.0000 mg | ORAL_TABLET | Freq: Every day | ORAL | Status: DC
  Administered 2020-08-13 – 2020-08-15 (×3): 50 mg via ORAL
  Filled 2020-08-13 (×3): qty 1

## 2020-08-13 MED ORDER — PRAVASTATIN SODIUM 40 MG PO TABS
40.0000 mg | ORAL_TABLET | Freq: Every day | ORAL | Status: DC
  Administered 2020-08-13 – 2020-08-15 (×3): 40 mg via ORAL
  Filled 2020-08-13 (×3): qty 1

## 2020-08-13 MED ORDER — IBUPROFEN 400 MG PO TABS
400.0000 mg | ORAL_TABLET | Freq: Once | ORAL | Status: AC
  Administered 2020-08-13: 400 mg via ORAL
  Filled 2020-08-13: qty 1

## 2020-08-13 MED ORDER — LACTATED RINGERS IV SOLN: INTRAVENOUS | Status: DC

## 2020-08-13 MED ORDER — ALBUTEROL SULFATE (2.5 MG/3ML) 0.083% IN NEBU: 2.5000 mg | INHALATION_SOLUTION | RESPIRATORY_TRACT | Status: DC | PRN

## 2020-08-13 MED ORDER — LORAZEPAM 0.5 MG PO TABS
0.5000 mg | ORAL_TABLET | Freq: Every day | ORAL | Status: DC | PRN
  Administered 2020-08-16: 0.5 mg via ORAL
  Filled 2020-08-13: qty 1

## 2020-08-13 MED ORDER — FLUTICASONE-UMECLIDIN-VILANT 100-62.5-25 MCG/INH IN AEPB: 1.0000 | INHALATION_SPRAY | RESPIRATORY_TRACT | Status: DC

## 2020-08-13 MED ORDER — GUAIFENESIN-DM 100-10 MG/5ML PO SYRP
5.0000 mL | ORAL_SOLUTION | ORAL | Status: DC | PRN
  Administered 2020-08-13 – 2020-08-16 (×5): 5 mL via ORAL
  Filled 2020-08-13 (×6): qty 5

## 2020-08-13 MED ORDER — IPRATROPIUM-ALBUTEROL 0.5-2.5 (3) MG/3ML IN SOLN
3.0000 mL | Freq: Four times a day (QID) | RESPIRATORY_TRACT | Status: DC
  Administered 2020-08-13 (×3): 3 mL via RESPIRATORY_TRACT
  Filled 2020-08-13 (×3): qty 3

## 2020-08-13 MED ORDER — PANTOPRAZOLE SODIUM 40 MG PO TBEC
40.0000 mg | DELAYED_RELEASE_TABLET | Freq: Every day | ORAL | Status: DC
  Administered 2020-08-13 – 2020-08-15 (×3): 40 mg via ORAL
  Filled 2020-08-13 (×3): qty 1

## 2020-08-13 MED ORDER — LORATADINE-PSEUDOEPHEDRINE ER 5-120 MG PO TB12: 1.0000 | ORAL_TABLET | Freq: Every day | ORAL | Status: DC

## 2020-08-13 MED ORDER — IBUPROFEN 400 MG PO TABS
600.0000 mg | ORAL_TABLET | Freq: Once | ORAL | Status: DC
  Filled 2020-08-13: qty 1

## 2020-08-13 MED ORDER — PSEUDOEPHEDRINE HCL ER 120 MG PO TB12
120.0000 mg | ORAL_TABLET | Freq: Every day | ORAL | Status: DC | PRN
  Administered 2020-08-15: 120 mg via ORAL
  Filled 2020-08-13 (×2): qty 1

## 2020-08-13 MED ORDER — APIXABAN 5 MG PO TABS
5.0000 mg | ORAL_TABLET | Freq: Two times a day (BID) | ORAL | Status: DC
  Administered 2020-08-13 – 2020-08-16 (×7): 5 mg via ORAL
  Filled 2020-08-13 (×7): qty 1

## 2020-08-13 MED ORDER — METHYLPREDNISOLONE SODIUM SUCC 40 MG IJ SOLR
40.0000 mg | Freq: Two times a day (BID) | INTRAMUSCULAR | Status: DC
  Administered 2020-08-13 (×2): 40 mg via INTRAVENOUS
  Filled 2020-08-13 (×4): qty 1

## 2020-08-13 MED ORDER — ENOXAPARIN SODIUM 40 MG/0.4ML ~~LOC~~ SOLN: 40.0000 mg | SUBCUTANEOUS | Status: DC

## 2020-08-13 MED ORDER — METHOCARBAMOL 500 MG PO TABS: 500.0000 mg | ORAL_TABLET | Freq: Four times a day (QID) | ORAL | Status: DC | PRN

## 2020-08-13 MED ORDER — SODIUM CHLORIDE 0.9 % IV BOLUS
1000.0000 mL | Freq: Once | INTRAVENOUS | Status: AC
  Administered 2020-08-13: 1000 mL via INTRAVENOUS

## 2020-08-13 MED ORDER — FLUTICASONE FUROATE-VILANTEROL 100-25 MCG/INH IN AEPB
1.0000 | INHALATION_SPRAY | Freq: Every day | RESPIRATORY_TRACT | Status: DC
  Administered 2020-08-13 – 2020-08-16 (×4): 1 via RESPIRATORY_TRACT
  Filled 2020-08-13: qty 28

## 2020-08-13 MED ORDER — LORATADINE 10 MG PO TABS: 5.0000 mg | ORAL_TABLET | Freq: Every day | ORAL | Status: DC | PRN

## 2020-08-13 MED ORDER — SODIUM CHLORIDE 0.9 % IV SOLN: INTRAVENOUS | Status: DC

## 2020-08-13 MED ORDER — MONTELUKAST SODIUM 10 MG PO TABS
10.0000 mg | ORAL_TABLET | Freq: Every day | ORAL | Status: DC
  Administered 2020-08-13 – 2020-08-15 (×3): 10 mg via ORAL
  Filled 2020-08-13 (×4): qty 1

## 2020-08-13 MED ORDER — IBUPROFEN 200 MG PO TABS
200.0000 mg | ORAL_TABLET | Freq: Once | ORAL | Status: AC
  Administered 2020-08-13: 200 mg via ORAL

## 2020-08-13 MED ORDER — IPRATROPIUM-ALBUTEROL 0.5-2.5 (3) MG/3ML IN SOLN: 3.0000 mL | Freq: Three times a day (TID) | RESPIRATORY_TRACT | Status: DC

## 2020-08-13 MED ORDER — FLUTICASONE PROPIONATE 50 MCG/ACT NA SUSP
1.0000 | Freq: Every day | NASAL | Status: DC
  Administered 2020-08-13 – 2020-08-16 (×4): 1 via NASAL
  Filled 2020-08-13: qty 16

## 2020-08-13 MED ORDER — SODIUM CHLORIDE 0.9 % IV SOLN
500.0000 mg | INTRAVENOUS | Status: DC
  Administered 2020-08-13 – 2020-08-14 (×2): 500 mg via INTRAVENOUS
  Filled 2020-08-13 (×3): qty 500

## 2020-08-13 NOTE — TOC Initial Note (Signed)
Transition of Care [facility]) - Initial/Assessment Note    Patient Details  Name: [name] Molina MRN: [id number] Date of Birth: 08/07/1945  Transition of Care [facility]) CM/SW Contact:    Carles Collet, RN Phone Number: 08/13/2020, 3:40 PM  Clinical Narrative:                 Spoke to patient at bedside. She states that she is from home, she lives with her daughter and two grandchildren, she has a rollator and shower chair at home. No new DME anticipated. She would like HH PT and OT through [facility], as she is familiar with them from the past.  Will need [facility] PT OT orders.    Expected Discharge Plan: [place] Barriers to Discharge: Continued Medical Work up   Patient Goals and CMS Choice Patient states their goals for this hospitalization and ongoing recovery are:: to go home CMS Medicare.gov Compare Post Acute Care list provided to:: Patient Choice offered to / list presented to : Patient  Expected Discharge Plan and Services Expected Discharge Plan: [place]   Discharge Planning Services: CM Consult Post Acute Care Choice: [employer] arrangements for the past 2 months: Single Family Home                           HH Arranged: PT,OT [place] Agency: [facility] Date [place]: 08/13/20 Time HH Agency Contacted: 79 Representative spoke with at [place]: [place] Arrangements/Services Living arrangements for the past 2 months: [place] with:: Adult Children   Do you feel safe going back to the place where you live?: Yes          Current home services: DME    Activities of Daily Living Home Assistive Devices/Equipment: Wheelchair,Cane (specify quad or straight),Shower chair with back,Bedside commode/3-in-1,Walker (specify type),Other (Comment) (rollator) ADL Screening (condition at time of admission) Patient's cognitive ability adequate to safely complete daily activities?: Yes Is the  patient deaf or have difficulty hearing?: No Does the patient have difficulty seeing, even when wearing glasses/contacts?: No Does the patient have difficulty concentrating, remembering, or making decisions?: No Patient able to express need for assistance with ADLs?: Yes Does the patient have difficulty dressing or bathing?: No Independently performs ADLs?: Yes (appropriate for developmental age) (does not cook but daughter does, requires assistive devices) Weakness of Legs: Both Weakness of Arms/Hands: Both (neuropathy in hands)  Permission Sought/Granted                  Emotional Assessment              Admission diagnosis:  Acute respiratory failure with hypoxia ([place]) [J96.01] Pneumonia of both lower lobes due to infectious organism [J18.9] Patient Active Problem List   Diagnosis Date Noted  . Sepsis due to pneumonia ([place]) 08/13/2020  . Pneumonia of both lower lobes due to infectious organism 08/13/2020  . AF (paroxysmal atrial fibrillation) ([place]) 08/13/2020  . Major depressive disorder 08/13/2020  . Chronic diastolic CHF (congestive heart failure) ([place]) 08/13/2020  . Elevated troponin level not due myocardial infarction 08/13/2020  . Atrial fibrillation with RVR ([place]) 09/18/2019  . Edema of both lower extremities 05/07/2018  . Pulmonary nodule 12/01/2017  . Dyspnea 11/26/2017  . RUQ abdominal pain 11/27/2015  . Suprapubic pain 11/27/2015  . Dysphagia 11/27/2015  . Abdominal mass, RUQ (right upper quadrant) 11/27/2015  . Obstructive sleep  apnea 04/06/2015  . PAT (paroxysmal atrial tachycardia) ([place]) 08/09/2014  . GERD without esophagitis   . Mixed hyperlipidemia   . Spinal stenosis of lumbar region 03/16/2014  . Idiopathic peripheral neuropathy 03/16/2014  . Hypertension 06/13/2011  . Allergic rhinitis 06/13/2011  . Chronic headaches 06/13/2011  . Emphysema 06/13/2011  . COPD with acute exacerbation ([place]) 04/25/2007   PCP:  Harlan Stains, MD Pharmacy:    [facility] - [place], [place] - [address] [place] [address] Phone: [phone] Fax: [phone]     Social Determinants of Health (SDOH) Interventions    Readmission Risk Interventions No flowsheet data found.

## 2020-08-13 NOTE — ED Notes (Signed)
RN attempted to call report x1 

## 2020-08-13 NOTE — Progress Notes (Signed)
PROGRESS NOTE                                                                             PROGRESS NOTE                                                                                                                                                                                                             Patient Demographics:    [name] Molina, is a 75 y.o. female, DOB - 24-Sep-1945, EZM:[id number]  Outpatient Primary MD for the patient is Harlan Stains, MD    LOS - 0  Admit date - 08/13/2020    No chief complaint on file.      Brief Narrative    This is a no charge note as patient was seen and admitted earlier today, imaging, chart, labs were reviewed, patient was seen and examined.  75 year old female with past medical history of COPD, diastolic congestive heart failure (Echo 08/2019 EF 65-70%), paroxysmal atrial fibrillation, macular degeneration, hyperlipidemia, gastroesophageal reflux disease, depression who presents to [facility] emergency department with complaints of generalized weakness and cough.  Patient explains that the over a week ago she began to develop a cough.  Initially cough was mild in intensity but as the days progressed cough became particularly severe, productive with thick yellow sputum.  This was associated with progressively worsening severe generalized weakness and poor appetite.  Patient also complains of development of wheezing and some shortness of breath over this span of time.  Patient also complaining of associated subjective fevers and chills as well.  Patient denies sick contacts, recent travel or confirmed contact with COVID-19 infection.  Patient symptoms continued to progress until patient eventually presented to [facility] emergency department for evaluation.  Upon evaluation in the emergency department patient was found to be tachycardic with mild fever of 100.4 F as well as evidence of lactic  acidosis and questionable bibasilar infiltrates on chest x-ray.  There is concern clinically for developing pneumonia and early sepsis and therefore the patient was  initiated on intravenous ceftriaxone and azithromycin.  Hospitalist group was then called to assess the patient for admission the hospital.    Subjective:    [name] Molina today still reports some dyspnea, generalized weakness, and cough.     Assessment  & Plan :    Principal Problem:   Pneumonia of both lower lobes due to infectious organism Active Problems:   COPD with acute exacerbation (HCC)   GERD without esophagitis   Mixed hyperlipidemia   Sepsis due to pneumonia (HCC)   AF (paroxysmal atrial fibrillation) (HCC)   Major depressive disorder   Chronic diastolic CHF (congestive heart failure) (HCC)   Elevated troponin level not due myocardial infarction  Pneumonia of both lower lobes due to infectious organism   Patient presenting with greater than 1 week history of generalized malaise, weakness and cough productive with purulent sputum  Chest x-ray revealing patchy bilateral lower lobe infiltrates concerning for early pneumonia  Patient additionally exhibiting multiple SIRS criteria including tachycardia and leukocytosis with concurrent lactic acidosis all concerning for early sepsis  Patient has been placed on intravenous antibiotic therapy with azithromycin and ceftriaxone  Diuretics have been held and patient is being hydrated with intravenous isotonic fluids  Blood cultures have been obtained.  Sputum cultures have been ordered.  Providing patient with submental oxygen as necessary to achieve oxygen saturations between 89 and 92%  COVID-19 PCR testing found to be negative  Active Problems:   COPD with acute exacerbation (HCC)   Patient additionally felt to be suffering from concurrent COPD exacerbation with evidence of prolonged expiratory phase and wheezing on admission examination  Patient been  placed on Solu-Medrol 40 mg IV every 12  Providing patient with aggressive bronchodilator therapy  Treating concurrent pneumonia as noted above  Supplemental oxygen with oxygen saturation between 89 and 92% as noted above.    Sepsis due to pneumonia [facility])   Concern for sepsis with likely causative organism being strep pneumoniae  Please see remainder of assessment and plan as above  Lactic acidosis  Patient exhibiting evidence of mild lactic acidosis of 2.6 likely secondary to underlying infection and volume depletion  Hydrating patient with intravenous isotonic fluids, treating patient with intravenous antibiotics  Performing serial lactic acid levels to ensure downtrending and resolution    Mixed hyperlipidemia   Continuing home regimen of lipid lowering therapy.    AF (paroxysmal atrial fibrillation) ([place])   Patient is currently in sinus rhythm  Monitoring patient on telemetry  Continue home [place] home regimen of metoprolol    Major depressive disorder   Continue home  psychotropic regimen    Chronic diastolic CHF (congestive heart failure) (HCC)   No clinical evidence of cardiogenic volume overload at this time  Temporarily holding diuretics today as patient is being gently hydrated with intravenous isotonic fluids.  Diuretics can be resumed tomorrow likely    Elevated troponin level not due myocardial infarction   Slight elevation in second troponin performed in the emergency department  Patient is chest pain-free  Flat trajectory of slight troponin elevation makes plaque rupture extremely unlikely and more likely secondary to supply demand mismatch due to ongoing infection.  Monitoring patient on telemetry    GERD without esophagitis   Continue home regimen of proton pump inhibitor    SpO2: 98 % O2 Flow Rate (L/min): 2 L/min FiO2 (%): 28 %  Recent Labs  Lab 08/13/20 0150 08/13/20 0213 08/13/20 0334  08/13/20 0702  WBC 11.7*  --   --  11.4*  PLT 178  --   --  178  BNP 135.2*  --   --   --   DDIMER  --   --  <0.27  --   PROCALCITON  --   --   --  0.25  AST 24  --   --  20  ALT 17  --   --  14  ALKPHOS 65  --   --  56  BILITOT 1.0  --   --  0.9  ALBUMIN 3.7  --   --  3.1*  INR  --   --   --  1.6*  LATICACIDVEN 2.6*  --  2.0*  --   SARSCOV2NAA  --  NEGATIVE  --   --     FiO2 (%):  [28 %] 28 %  ABG  No results found for: PHART, PCO2ART, PO2ART, HCO3, TCO2, ACIDBASEDEF, O2SAT         Condition - Extremely Guarded  Family Communication  :  None at bedside  Code Status :  DNR  Consults  : none  Disposition Plan  :    Status is: Observation  The patient remains OBS appropriate and will d/c before 2 midnights.  Dispo: The patient is from: Home              Anticipated d/c is to: Home              Patient currently is not medically stable to d/c.   Difficult to place patient No      DVT Prophylaxis  :  ELIQUIS  Lab Results  Component Value Date   PLT 178 08/13/2020    Diet :  Diet Order            Diet Heart Room service appropriate? Yes; Fluid consistency: Thin  Diet effective now                  Inpatient Medications  Scheduled Meds: . apixaban  5 mg Oral BID  . DULoxetine  60 mg Oral q morning  . fluticasone  1 spray Each Nare Daily  . fluticasone furoate-vilanterol  1 puff Inhalation Daily  . ipratropium-albuterol  3 mL Nebulization Q6H  . methylPREDNISolone (SOLU-MEDROL) injection  40 mg Intravenous Q12H  . metoprolol succinate  50 mg Oral QHS  . montelukast  10 mg Oral QHS  . pantoprazole  40 mg Oral QHS  . pravastatin  40 mg Oral QHS  . umeclidinium bromide  1 puff Inhalation Daily   Continuous Infusions: . sodium chloride 125 mL/hr at 08/13/20 0630  . azithromycin Stopped (08/13/20 0429)  . cefTRIAXone (ROCEPHIN)  IV Stopped (08/13/20 0429)   PRN Meds:.albuterol, loratadine, methocarbamol, pseudoephedrine  Antibiotics  :     Anti-infectives (From admission, onward)   Start     Dose/Rate Route Frequency Ordered Stop   08/13/20 0315  cefTRIAXone (ROCEPHIN) 2 g in sodium chloride 0.9 % 100 mL IVPB        2 g 200 mL/hr over 30 Minutes Intravenous Every 24 hours 08/13/20 0305     08/13/20 0315  azithromycin (ZITHROMAX) 500 mg in sodium chloride 0.9 % 250 mL IVPB        500 mg 250 mL/hr over 60 Minutes Intravenous Every 24 hours 08/13/20 0305         Phillips Climes M.D on 08/13/2020 at 11:17 AM  To page go to [email or web address]   [facility] -  Office  [phone]  Objective:   Vitals:   08/13/20 0400 08/13/20 0505 08/13/20 0618 08/13/20 0804  BP: 134/84  (!) 115/57   Pulse: (!) 114  88   Resp: 10  18   Temp:  98.4 F (36.9 C) 98.1 F (36.7 C)   TempSrc:  Oral Oral   SpO2: 95%  99% 98%  Weight:   100.9 kg   Height:   5\' 2"  (1.575 m)     Wt Readings from Last 3 Encounters:  08/13/20 100.9 kg  06/15/20 100.5 kg  06/14/20 100.5 kg     Intake/Output Summary (Last 24 hours) at 08/13/2020 1117 Last data filed at 08/13/2020 0429 Gross per 24 hour  Intake 1350 ml  Output -  Net 1350 ml     Physical Exam  Awake Alert, No new F.N deficits, Normal affect Symmetrical Chest wall movement, Good air movement bilaterally, scattered wheezing, mildly dyspneic RRR,No Gallops,Rubs or new Murmurs, No Parasternal Heave +ve B.Sounds, Abd Soft, No tenderness,  No rebound - guarding or rigidity. No Cyanosis, Clubbing or edema, No new Rash or bruise      Data Review:    CBC Recent Labs  Lab 08/13/20 0150 08/13/20 0702  WBC 11.7* 11.4*  HGB 13.6 12.0  HCT 42.1 35.5*  PLT 178 178  MCV 85.6 84.7  MCH 27.6 28.6  MCHC 32.3 33.8  RDW 15.3 15.5  LYMPHSABS 0.4* 0.9  MONOABS 0.7 0.9  EOSABS 0.0 0.0  BASOSABS 0.0 0.1    Recent Labs  Lab 08/13/20 0150 08/13/20 0334 08/13/20 0702  NA 140  --  142  K 3.1*  --  3.5  CL 107  --  110  CO2 22  --  24  GLUCOSE 143*  --  118*  BUN 12   --  13  CREATININE 0.80  --  0.87  CALCIUM 8.6*  --  8.1*  AST 24  --  20  ALT 17  --  14  ALKPHOS 65  --  56  BILITOT 1.0  --  0.9  ALBUMIN 3.7  --  3.1*  MG  --   --  1.6*  DDIMER  --  <0.27  --   PROCALCITON  --   --  0.25  LATICACIDVEN 2.6* 2.0*  --   INR  --   --  1.6*  BNP 135.2*  --   --     ------------------------------------------------------------------------------------------------------------------ No results for input(s): CHOL, HDL, LDLCALC, TRIG, CHOLHDL, LDLDIRECT in the last 72 hours.  No results found for: HGBA1C ------------------------------------------------------------------------------------------------------------------ No results for input(s): TSH, T4TOTAL, T3FREE, THYROIDAB in the last 72 hours.  Invalid input(s): FREET3  Cardiac Enzymes No results for input(s): CKMB, TROPONINI, MYOGLOBIN in the last 168 hours.  Invalid input(s): CK ------------------------------------------------------------------------------------------------------------------    Component Value Date/Time   BNP 135.2 (H) 08/13/2020 0150    Micro Results Recent Results (from the past 240 hour(s))  Resp Panel by RT-PCR (Flu A&B, Covid) Nasopharyngeal Swab     Status: None   Collection Time: 08/13/20  2:13 AM   Specimen: Nasopharyngeal Swab; Nasopharyngeal(NP) swabs in vial transport medium  Result Value Ref Range Status   SARS Coronavirus 2 by RT PCR NEGATIVE NEGATIVE Final    Comment: (NOTE) SARS-CoV-2 target nucleic acids are NOT DETECTED.  The SARS-CoV-2 RNA is generally detectable in upper respiratory specimens during the acute phase of infection. The lowest concentration of SARS-CoV-2 viral copies this assay can detect is 138 copies/mL. A negative result does not preclude SARS-Cov-2  infection and should not be used as the sole basis for treatment or other patient management decisions. A negative result may occur with  improper specimen collection/handling, submission  of specimen other than nasopharyngeal swab, presence of viral mutation(s) within the areas targeted by this assay, and inadequate number of viral copies(<138 copies/mL). A negative result must be combined with clinical observations, patient history, and epidemiological information. The expected result is Negative.  Fact Sheet for Patients:  EntrepreneurPulse.com.au  Fact Sheet for Healthcare Providers:  IncredibleEmployment.be  This test is no t yet approved or cleared by the [place] FDA and  has been authorized for detection and/or diagnosis of SARS-CoV-2 by FDA under an Emergency Use Authorization (EUA). This EUA will remain  in effect (meaning this test can be used) for the duration of the COVID-19 declaration under Section 564(b)(1) of the Act, 21 U.S.C.section 360bbb-3(b)(1), unless the authorization is terminated  or revoked sooner.       Influenza A by PCR NEGATIVE NEGATIVE Final   Influenza B by PCR NEGATIVE NEGATIVE Final    Comment: (NOTE) The Xpert Xpress SARS-CoV-2/FLU/RSV plus assay is intended as an aid in the diagnosis of influenza from Nasopharyngeal swab specimens and should not be used as a sole basis for treatment. Nasal washings and aspirates are unacceptable for Xpert Xpress SARS-CoV-2/FLU/RSV testing.  Fact Sheet for Patients: EntrepreneurPulse.com.au  Fact Sheet for Healthcare Providers: IncredibleEmployment.be  This test is not yet approved or cleared by the [place] FDA and has been authorized for detection and/or diagnosis of SARS-CoV-2 by FDA under an Emergency Use Authorization (EUA). This EUA will remain in effect (meaning this test can be used) for the duration of the COVID-19 declaration under Section 564(b)(1) of the Act, 21 U.S.C. section 360bbb-3(b)(1), unless the authorization is terminated or revoked.  Performed at [facility], [place] [address],  [place], [place] [address]   Expectorated Sputum Assessment w Gram Stain, Rflx to Resp Cult     Status: None   Collection Time: 08/13/20  8:12 AM   Specimen: Expectorated Sputum  Result Value Ref Range Status   Specimen Description EXPECTORATED SPUTUM  Final   Special Requests NONE  Final   Sputum evaluation   Final    THIS SPECIMEN IS ACCEPTABLE FOR SPUTUM CULTURE Performed at [facility], [place] [address], [place], [place] [address]    Report Status 08/13/2020 FINAL  Final  Culture, Respiratory w Gram Stain     Status: None (Preliminary result)   Collection Time: 08/13/20  8:12 AM  Result Value Ref Range Status   Specimen Description EXPECTORATED SPUTUM  Final   Special Requests NONE Reflexed from [id number]  Final   Gram Stain   Final    ABUNDANT WBC PRESENT, PREDOMINANTLY PMN RARE SQUAMOUS EPITHELIAL CELLS PRESENT RARE GRAM POSITIVE RODS RARE GRAM POSITIVE COCCI IN PAIRS RARE GRAM NEGATIVE RODS Performed at [facility], [place] [address] [place], [place] [address]    Culture PENDING  Incomplete   Report Status PENDING  Incomplete    Radiology Reports DG Chest Portable 1 View  Result Date: 08/13/2020 CLINICAL DATA:  One week of cough and congestion with shortness of breath on exertion EXAM: PORTABLE CHEST 1 VIEW COMPARISON:  Chest radiograph March 01, 2020 and chest CT December 16, 2018. FINDINGS: The heart size and mediastinal contours are within normal limits. Aortic arch calcifications. Right lower lobe scarring. Bibasilar atelectasis. No focal consolidation or overt pulmonary edema. No significant pleural effusion  or visualized pneumothorax. Anterior cervical fixation hardware. Degenerative changes bilateral glenohumeral and acromioclavicular joints. IMPRESSION: Bibasilar atelectasis and right lower lobe scarring. No definite acute findings. Electronically Signed   By: Dahlia Bailiff MD   On: 08/13/2020 01:49

## 2020-08-13 NOTE — ED Notes (Signed)
RN attempted to call report x2. 

## 2020-08-13 NOTE — Plan of Care (Signed)
  Problem: Education: Goal: Knowledge of General Education information will improve Description Including pain rating scale, medication(s)/side effects and non-pharmacologic comfort measures Outcome: Progressing   

## 2020-08-13 NOTE — ED Provider Notes (Signed)
Emergency Department Provider Note   I have reviewed the triage vital signs and the nursing notes.   HISTORY  Chief Complaint No chief complaint on file.   HPI [name] Molina is a 75 y.o. female with past medical history reviewed below including presents to the emergency department for evaluation of shortness of breath with acute onset severe fatigue with some associated chest pressure.  Patient tells me she has been feeling nasal congestion and cough over the past week.  She is been taking a daytime formulation of Tylenol Cold and flu severe along with Flonase for congestion.  She denies noticing any fever or chills.  She was at home with her daughter this evening in the bathroom when she felt suddenly very fatigued and short of breath.  She noted some mild, central pressure in the chest.  She had to call for help from her daughter to help get her back to her chair and her daughter promptly called 911.  Patient denies any syncope.  She had attributed her symptoms mainly to seasonal allergies.  She denies prior history of CAD.  She has been using her albuterol inhaler from time to time with history of COPD.  She does not wear oxygen at home. Denies abdominal pain, vomiting, diarrhea, or UTI symptoms.  Patient received a single dose of the [employer] vaccine.  Past Medical History:  Diagnosis Date  . Anxiety   . Arthritis   . Asthma   . Atrial tachycardia ([place])   . Cancer ([place])    basal skin cancer on nose  . Cataract   . CHF (congestive heart failure) ([place])   . Complication of anesthesia    during cervical surgery she woke up during the procedure  . COPD (chronic obstructive pulmonary disease) ([place])   . Depression   . Diverticulosis   . Family history of adverse reaction to anesthesia    mother had post op N&V  . GERD (gastroesophageal reflux disease)   . History of hiatal hernia   . Hyperlipidemia   . Hypertension   . Insomnia   . Macular degeneration   . Migraines    . Neuropathy   . Pneumonia   . Sleep apnea    history of it,not using a cpap, study 2018: didn't need cpap  . Thyroid nodule 2000   radioactive caspule    Patient Active Problem List   Diagnosis Date Noted  . COPD exacerbation ([place]) 08/14/2020  . Sepsis due to pneumonia ([place]) 08/13/2020  . Pneumonia of both lower lobes due to infectious organism 08/13/2020  . AF (paroxysmal atrial fibrillation) ([place]) 08/13/2020  . Major depressive disorder 08/13/2020  . Chronic diastolic CHF (congestive heart failure) ([place]) 08/13/2020  . Elevated troponin level not due myocardial infarction 08/13/2020  . Atrial fibrillation with RVR ([place]) 09/18/2019  . Edema of both lower extremities 05/07/2018  . Pulmonary nodule 12/01/2017  . Dyspnea 11/26/2017  . RUQ abdominal pain 11/27/2015  . Suprapubic pain 11/27/2015  . Dysphagia 11/27/2015  . Abdominal mass, RUQ (right upper quadrant) 11/27/2015  . Obstructive sleep apnea 04/06/2015  . PAT (paroxysmal atrial tachycardia) ([place]) 08/09/2014  . GERD without esophagitis   . Mixed hyperlipidemia   . Spinal stenosis of lumbar region 03/16/2014  . Idiopathic peripheral neuropathy 03/16/2014  . Hypertension 06/13/2011  . Allergic rhinitis 06/13/2011  . Chronic headaches 06/13/2011  . Emphysema 06/13/2011  . COPD with acute exacerbation ([place]) 04/25/2007    Past Surgical History:  Procedure Laterality  Date  . ANTERIOR CERVICAL DECOMP/DISCECTOMY FUSION N/A 06/15/2020   Procedure: ANTERIOR CERVICAL DECOMPRESSION FUSION CERVICAL FIVE-CERVICAL SIX WITH INSTRUMENTATION AND ALLOGRAFT;  Surgeon: Phylliss Bob, MD;  Location: [facility];  Service: Orthopedics;  Laterality: N/A;  anterior  . CERVICAL POLYPECTOMY     INSIDE AND OUTSIDE  . COLONOSCOPY    . EYE SURGERY Bilateral    cataracts  . POLYPECTOMY    . REMOVED CARTLIAGE RIB      Allergies Food, Tequin, Codeine, Erythromycin, Levaquin [levofloxacin in d5w], Oxycodone-acetaminophen, Wellbutrin [bupropion  hcl], Penicillins, and Prednisone  Family History  Problem Relation Age of Onset  . Colon polyps Sister 24       PRECANCEROUS POLYPS  . Breast cancer Maternal Grandmother 63  . Emphysema Father   . Asthma Father   . Heart disease Father   . Heart disease Paternal Grandfather   . Colon cancer Neg Hx     Social History Social History   Tobacco Use  . Smoking status: Former Smoker    Packs/day: 1.00    Years: 20.00    Pack years: 20.00    Types: Cigarettes    Quit date: 05/21/1991    Years since quitting: 29.2  . Smokeless tobacco: Never Used  Vaping Use  . Vaping Use: Never used  Substance Use Topics  . Alcohol use: No  . Drug use: No    Review of Systems  Constitutional: No fever/chills Eyes: No visual changes. ENT: No sore throat. Positive nasal congestion.  Cardiovascular: Positive chest pain. Respiratory: Positive shortness of breath and cough.  Gastrointestinal: No abdominal pain. No nausea, no vomiting. No diarrhea. No constipation. Genitourinary: Negative for dysuria. Musculoskeletal: Negative for back pain. Skin: Negative for rash. Neurological: Negative for focal weakness or numbness. Positive HA.   10-point ROS otherwise negative.  ____________________________________________   PHYSICAL EXAM:  VITAL SIGNS: Temp: 70F Resp: 16 SpO2: 98% 2L Pulse: 105 BP: 164/74  Constitutional: Alert and oriented. Well appearing and in no acute distress. Eyes: Conjunctivae are normal.  Head: Atraumatic. Nose: Positive congestion/rhinnorhea. Mouth/Throat: Mucous membranes are moist.  Oropharynx non-erythematous. Neck: No stridor.  Cardiovascular: Tachycardia. Good peripheral circulation. Grossly normal heart sounds.   Respiratory: Normal respiratory effort.  No retractions. Lungs with mild rhonchi at the bases.  Gastrointestinal: Soft and nontender. No distention.  Musculoskeletal: No lower extremity tenderness nor edema. No gross deformities of  extremities. Neurologic:  Normal speech and language.  Skin:  Skin is warm, dry and intact. No rash noted.  ____________________________________________   LABS (all labs ordered are listed, but only abnormal results are displayed)  Labs Reviewed  COMPREHENSIVE METABOLIC PANEL - Abnormal; Notable for the following components:      Result Value   Potassium 3.1 (*)    Glucose, Bld 143 (*)    Calcium 8.6 (*)    Total Protein 6.3 (*)    All other components within normal limits  BRAIN NATRIURETIC PEPTIDE - Abnormal; Notable for the following components:   B Natriuretic Peptide 135.2 (*)    All other components within normal limits  CBC WITH DIFFERENTIAL/PLATELET - Abnormal; Notable for the following components:   WBC 11.7 (*)    Neutro Abs 10.4 (*)    Lymphs Abs 0.4 (*)    All other components within normal limits  SALICYLATE LEVEL - Abnormal; Notable for the following components:   Salicylate Lvl <5.8 (*)    All other components within normal limits  LACTIC ACID, PLASMA - Abnormal; Notable for the  following components:   Lactic Acid, Venous 2.6 (*)    All other components within normal limits  LACTIC ACID, PLASMA - Abnormal; Notable for the following components:   Lactic Acid, Venous 2.0 (*)    All other components within normal limits  PROTIME-INR - Abnormal; Notable for the following components:   Prothrombin Time 18.0 (*)    INR 1.6 (*)    All other components within normal limits  CORTISOL-AM, BLOOD - Abnormal; Notable for the following components:   Cortisol - AM 5.6 (*)    All other components within normal limits  COMPREHENSIVE METABOLIC PANEL - Abnormal; Notable for the following components:   Glucose, Bld 118 (*)    Calcium 8.1 (*)    Total Protein 5.4 (*)    Albumin 3.1 (*)    All other components within normal limits  MAGNESIUM - Abnormal; Notable for the following components:   Magnesium 1.6 (*)    All other components within normal limits  CBC WITH  DIFFERENTIAL/PLATELET - Abnormal; Notable for the following components:   WBC 11.4 (*)    HCT 35.5 (*)    Neutro Abs 9.4 (*)    All other components within normal limits  CBC - Abnormal; Notable for the following components:   WBC 11.1 (*)    Hemoglobin 11.4 (*)    HCT 35.7 (*)    All other components within normal limits  BASIC METABOLIC PANEL - Abnormal; Notable for the following components:   Glucose, Bld 250 (*)    Calcium 8.8 (*)    All other components within normal limits  GLUCOSE, CAPILLARY - Abnormal; Notable for the following components:   Glucose-Capillary 185 (*)    All other components within normal limits  TROPONIN I (HIGH SENSITIVITY) - Abnormal; Notable for the following components:   Troponin I (High Sensitivity) 19 (*)    All other components within normal limits  RESP PANEL BY RT-PCR (FLU A&B, COVID) ARPGX2  CULTURE, BLOOD (ROUTINE X 2)  CULTURE, BLOOD (ROUTINE X 2)  EXPECTORATED SPUTUM ASSESSMENT W GRAM STAIN, RFLX TO RESP C  CULTURE, RESPIRATORY W GRAM STAIN  URINE CULTURE  ACETAMINOPHEN LEVEL  D-DIMER, QUANTITATIVE  LACTIC ACID, PLASMA  HIV ANTIBODY (ROUTINE TESTING W REFLEX)  PROCALCITONIN  URINALYSIS, ROUTINE W REFLEX MICROSCOPIC  HEMOGLOBIN A1C  TROPONIN I (HIGH SENSITIVITY)   ____________________________________________  EKG   EKG Interpretation  Date/Time:  Sunday August 13 2020 03:28:05 EDT Ventricular Rate:  118 PR Interval:    QRS Duration: 90 QT Interval:  313 QTC Calculation: 439 R Axis:   37 Text Interpretation: Sinus tachycardia Atrial premature complex Abnormal R-wave progression, early transition Repol abnrm, severe global ischemia (LM/MVD) Confirmed by Nanda Quinton [phone]) on 08/13/2020 3:42:35 AM Also confirmed by Nanda Quinton [phone]), editor Hattie Perch ([id number])  on 08/14/2020 8:21:56 AM       ____________________________________________  RADIOLOGY  CXR reviewed.   ____________________________________________   PROCEDURES  Procedure(s) performed:   Procedures  CRITICAL CARE Performed by: Margette Fast Total critical care time: 35 minutes Critical care time was exclusive of separately billable procedures and treating other patients. Critical care was necessary to treat or prevent imminent or life-threatening deterioration. Critical care was time spent personally by me on the following activities: development of treatment plan with patient and/or surrogate as well as nursing, discussions with consultants, evaluation of patient's response to treatment, examination of patient, obtaining history from patient or surrogate, ordering and performing treatments and interventions, ordering and review  of laboratory studies, ordering and review of radiographic studies, pulse oximetry and re-evaluation of patient's condition.  Nanda Quinton, MD Emergency Medicine  ____________________________________________   INITIAL IMPRESSION / ASSESSMENT AND PLAN / ED COURSE  Pertinent labs & imaging results that were available during my care of the patient were reviewed by me and considered in my medical decision making (see chart for details).   Patient presents to the emergency department with mainly URI/flulike symptoms over the past week but tonight became acutely weak with some shortness of breath and central chest heaviness.  No pleuritic chest pain.  On arrival she is febrile to 100.4.  She overall appears well and I do have her on 2 L nasal cannula oxygen.  I am not hearing significant wheezing to suspect COPD exacerbation.  She is very tachycardic on arrival.  PE is a consideration of the lower my differential with cold and flu symptoms. EKG with some ST changes diffusely. Suspect rate-related ST changes rather than primary ischemia but will obtain troponin.   03:20 AM  Patient reassessed after labs are coming back.  Her COVID and flu are negative.  Her troponins  are within normal limits.  Mild leukocytosis with elevated lactic acid.  She feels that overall her breathing is improved.  She is not having chest discomfort.  She is complaining of diaphoresis but overall looks well.  Her tachycardia is improved mildly.  I have added on a D-dimer and repeat EKG. we will start antibiotics.  Chest x-ray with some atelectasis but favor some developing pneumonia here based on her clinical presentation.    ____________________________________________  FINAL CLINICAL IMPRESSION(S) / ED DIAGNOSES  Final diagnoses:  Acute respiratory failure with hypoxia ([place])     MEDICATIONS GIVEN DURING THIS VISIT:  Medications  cefTRIAXone (ROCEPHIN) 2 g in sodium chloride 0.9 % 100 mL IVPB (2 g Intravenous New Bag/Given 08/14/20 2151)  metoprolol succinate (TOPROL-XL) 24 hr tablet 50 mg (50 mg Oral Given 08/14/20 2129)  pravastatin (PRAVACHOL) tablet 40 mg (40 mg Oral Given 08/14/20 2128)  DULoxetine (CYMBALTA) DR capsule 60 mg (60 mg Oral Given 08/15/20 0936)  pantoprazole (PROTONIX) EC tablet 40 mg (40 mg Oral Given 08/14/20 2128)  methocarbamol (ROBAXIN) tablet 500 mg (has no administration in time range)  fluticasone (FLONASE) 50 MCG/ACT nasal spray 1 spray (1 spray Each Nare Given 08/15/20 0926)  montelukast (SINGULAIR) tablet 10 mg (10 mg Oral Given 08/14/20 2128)  albuterol (PROVENTIL) (2.5 MG/3ML) 0.083% nebulizer solution 2.5 mg (has no administration in time range)  fluticasone furoate-vilanterol (BREO ELLIPTA) 100-25 MCG/INH 1 puff (1 puff Inhalation Given 08/15/20 0818)  loratadine (CLARITIN) tablet 5 mg (has no administration in time range)  pseudoephedrine (SUDAFED) 12 hr tablet 120 mg (120 mg Oral Given 08/15/20 0921)  apixaban (ELIQUIS) tablet 5 mg (5 mg Oral Given 08/15/20 0921)  LORazepam (ATIVAN) tablet 0.5-1 mg (has no administration in time range)  vitamin B-12 (CYANOCOBALAMIN) tablet 100 mcg (100 mcg Oral Given 08/15/20 0920)  guaiFENesin-dextromethorphan  (ROBITUSSIN DM) 100-10 MG/5ML syrup 5 mL (5 mLs Oral Given 08/15/20 0522)  methylPREDNISolone sodium succinate (SOLU-MEDROL) 40 mg/mL injection 40 mg (40 mg Intravenous Given 08/15/20 0523)  ipratropium-albuterol (DUONEB) 0.5-2.5 (3) MG/3ML nebulizer solution 3 mL (3 mLs Nebulization Given 08/15/20 0818)  acetaminophen (TYLENOL) tablet 650 mg (650 mg Oral Given 08/14/20 2152)  guaiFENesin (MUCINEX) 12 hr tablet 1,200 mg (1,200 mg Oral Given 08/15/20 0920)  azithromycin (ZITHROMAX) tablet 500 mg (500 mg Oral Given 08/15/20 0934)  insulin aspart (  novoLOG) injection 0-9 Units (has no administration in time range)  insulin aspart (novoLOG) injection 0-5 Units (has no administration in time range)  sodium chloride 0.9 % bolus 1,000 mL (0 mLs Intravenous Stopped 08/13/20 0429)  ibuprofen (ADVIL) tablet 400 mg (400 mg Oral Given 08/13/20 0344)  ibuprofen (ADVIL) tablet 200 mg (200 mg Oral Given 08/13/20 0343)  magnesium sulfate IVPB 2 g 50 mL (2 g Intravenous New Bag/Given 08/14/20 0957)  furosemide (LASIX) injection 40 mg (40 mg Intravenous Given 08/15/20 0920)    Note:  This document was prepared using Dragon voice recognition software and may include unintentional dictation errors.  Nanda Quinton, MD, [facility] Emergency Medicine    Mandie Crabbe, [facility], MD 08/15/20 1022

## 2020-08-13 NOTE — ED Notes (Signed)
RN made Dr. Laverta Baltimore aware of pt critical lactic acid of 2.6

## 2020-08-13 NOTE — Evaluation (Signed)
Occupational Therapy Evaluation Patient Details Name: [name] Molina MRN: [id number] DOB: [birth year] Today's Date: 08/13/2020    History of Present Illness Pt is a 75 y.o. F admitted 3/27 with generalized weakness and cough. Chest x-ray revealing patchy bilateral lower lobe infiltrates concerning for early PNA. Additionally felt to be experiencing a concurrent COPD exacerbation. Significant PMH: COPD, diastolic CHF, paroxysmal atrial fibrillation, depression.   Clinical Impression   Pt PTA: Pt living at home with daughter and reports independence with ADL and mobility with rollator. Pt currently, minguardA for mobility with hand held assist or with RW. Pt modified independent to supervisionA for ADL at this time; pt using RW for stability and grab bars in BA for toilet hygiene. Pt performing tasks with fatigue and SOB, but O2 sats >90% with exertion and HR 100-106 BPM. Pt would benefit from continued OT skilled services for ADL and energy conservation techniques. OT following acutely.     Follow Up Recommendations  No OT follow up    Equipment Recommendations  None recommended by OT    Recommendations for Other Services       Precautions / Restrictions Precautions Precautions: Fall Restrictions Weight Bearing Restrictions: No      Mobility Bed Mobility Overal bed mobility: Needs Assistance Bed Mobility: Sit to Supine       Sit to supine: Min assist   General bed mobility comments: assist for BLEs to move onto bed; pt scooting self to [facility]    Transfers Overall transfer level: Needs assistance Equipment used: Rolling walker (2 wheeled) Transfers: Sit to/from Stand Sit to Stand: Supervision         General transfer comment: Cues for hand placement    Balance Overall balance assessment: Needs assistance Sitting-balance support: Feet supported Sitting balance-Leahy Scale: Good     Standing balance support: Bilateral upper extremity supported Standing balance-Leahy  Scale: Poor Standing balance comment: reliant on external support; using this OTR's hands and furniture as the did not like the RW in her room.                           ADL either performed or assessed with clinical judgement   ADL Overall ADL's : At baseline                                     Functional mobility during ADLs: Min guard;Rolling walker;Cueing for safety General ADL Comments: Pt modified independent to supervisionA for ADL at this time; pt using RW for stability and grab bars in BA for toilet hygiene. Pt performing grooming at sink in standing, but reports feeling very tired and wanting to nap. Pt returned to bed with minA for bed mobility to bring BLEs up onto bed. Pt scooting to HOB in reverse trendelenberg with no physical assist- put using arms over head and BLEs to push. Pt performing tasks with fatigue and SOB, but O2 sats >90% with exertion and HR 105 BPM.     Vision Baseline Vision/History: Wears glasses Wears Glasses: Reading only Patient Visual Report: No change from baseline Vision Assessment?: No apparent visual deficits     Perception     Praxis      Pertinent Vitals/Pain Pain Assessment: No/denies pain     Hand Dominance Right   Extremity/Trunk Assessment Upper Extremity Assessment Upper Extremity Assessment: Generalized weakness   Lower Extremity Assessment Lower Extremity Assessment:  Generalized weakness   Cervical / Trunk Assessment Cervical / Trunk Assessment: Other exceptions Cervical / Trunk Exceptions: large body habitus   Communication Communication Communication: No difficulties   Cognition Arousal/Alertness: Awake/alert Behavior During Therapy: WFL for tasks assessed/performed Overall Cognitive Status: Within Functional Limits for tasks assessed                                     General Comments  O2 >92% on RA; HR 100-106 BPM with exertion.    Exercises     Shoulder Instructions       Home Living Family/patient expects to be discharged to:: Private residence Living Arrangements: Children (daughter) Available Help at Discharge: Family;Available PRN/intermittently Type of Home: House Home Access: Stairs to enter [employer] of Steps: 1 Entrance Stairs-Rails: None Home Layout: Able to live on main level with bedroom/bathroom     Bathroom Shower/Tub: Occupational psychologist: Standard     Home Equipment: Cane - single point;Walker - 4 wheels;Shower seat;Bedside commode;Grab bars - tub/shower          Prior Functioning/Environment Level of Independence: Needs assistance  Gait / Transfers Assistance Needed: household ambulator with Rollator ADL's / Homemaking Assistance Needed: assist for IADL's, independent ADL's; lives with daughter who can assist, but daughter works during the day.            OT Problem List: Decreased activity tolerance;Impaired balance (sitting and/or standing);Decreased safety awareness;Cardiopulmonary status limiting activity;Increased edema      OT Treatment/Interventions: Self-care/ADL training;Therapeutic exercise;Energy conservation;Therapeutic activities;Patient/family education;Balance training    OT Goals(Current goals can be found in the care plan section) Acute Rehab OT Goals Patient Stated Goal: feel better OT Goal Formulation: With patient Time For Goal Achievement: 08/27/20 Potential to Achieve Goals: Good ADL Goals Additional ADL Goal #1: Pt will perform x6 mins of OOB ADL tasks in standing with 1 seated rest break with O2 >90% throughout exertion. Additional ADL Goal #2: Pt will state/utilize 3 energy conservation techniques for ADL tasks in order to increase activity tolerance.  OT Frequency: Min 2X/week   Barriers to D/C:            Co-evaluation              AM-PAC OT "6 Clicks" Daily Activity     Outcome Measure Help from another person eating meals?: None Help from another  person taking care of personal grooming?: A Little Help from another person toileting, which includes using toliet, bedpan, or urinal?: A Little Help from another person bathing (including washing, rinsing, drying)?: A Little Help from another person to put on and taking off regular upper body clothing?: None Help from another person to put on and taking off regular lower body clothing?: A Little 6 Click Score: 20   End of Session Equipment Utilized During Treatment: Rolling walker Nurse Communication: Mobility status  Activity Tolerance: Patient tolerated treatment well Patient left: in bed;with call bell/phone within reach;with bed alarm set  OT Visit Diagnosis: Unsteadiness on feet (R26.81);Muscle weakness (generalized) (M62.81)                Time: 2620-3559 OT Time Calculation (min): 29 min Charges:  OT General Charges $OT Visit: 1 Visit OT Evaluation $OT Eval Moderate Complexity: 1 Mod OT Treatments $Self Care/Home Management : 8-22 mins  Jefferey Pica, OTR/L Acute Rehabilitation Services Pager: [phone] Office: [phone]   ALLISON C 08/13/2020, 1:31  PM

## 2020-08-13 NOTE — H&P (Addendum)
History and Physical    [name] Molina OYD:[id number] DOB: 03/08/1946 DOA: 08/13/2020  PCP: Harlan Stains, MD  Patient coming from: Home via EMS   Chief Complaint: Cough   HPI:    75 year old female with past medical history of COPD, diastolic congestive heart failure (Echo 08/2019 EF 65-70%), paroxysmal atrial fibrillation, macular degeneration, hyperlipidemia, gastroesophageal reflux disease, depression who presents to [facility] emergency department with complaints of generalized weakness and cough.  Patient explains that the over a week ago she began to develop a cough.  Initially cough was mild in intensity but as the days progressed cough became particularly severe, productive with thick yellow sputum.  This was associated with progressively worsening severe generalized weakness and poor appetite.  Patient also complains of development of wheezing and some shortness of breath over this span of time.  Patient also complaining of associated subjective fevers and chills as well.  Patient denies sick contacts, recent travel or confirmed contact with COVID-19 infection.  Patient symptoms continued to progress until patient eventually presented to [facility] emergency department for evaluation.  Upon evaluation in the emergency department patient was found to be tachycardic with mild fever of 100.4 F as well as evidence of lactic acidosis and questionable bibasilar infiltrates on chest x-ray.  There is concern clinically for developing pneumonia and early sepsis and therefore the patient was initiated on intravenous ceftriaxone and azithromycin.  Hospitalist group was then called to assess the patient for admission the hospital.  Review of Systems:   Review of Systems  Constitutional: Positive for chills and malaise/fatigue.  Respiratory: Positive for cough, shortness of breath and wheezing.   Neurological: Positive for weakness.    Past Medical History:  Diagnosis Date   . Anxiety   . Arthritis   . Asthma   . Atrial tachycardia ([place])   . Cancer ([place])    basal skin cancer on nose  . Cataract   . CHF (congestive heart failure) ([place])   . Complication of anesthesia    during cervical surgery she woke up during the procedure  . COPD (chronic obstructive pulmonary disease) ([place])   . Depression   . Diverticulosis   . Family history of adverse reaction to anesthesia    mother had post op N&V  . GERD (gastroesophageal reflux disease)   . History of hiatal hernia   . Hyperlipidemia   . Hypertension   . Insomnia   . Macular degeneration   . Migraines   . Neuropathy   . Pneumonia   . Sleep apnea    history of it,not using a cpap, study 2018: didn't need cpap  . Thyroid nodule 2000   radioactive caspule    Past Surgical History:  Procedure Laterality Date  . ANTERIOR CERVICAL DECOMP/DISCECTOMY FUSION N/A 06/15/2020   Procedure: ANTERIOR CERVICAL DECOMPRESSION FUSION CERVICAL FIVE-CERVICAL SIX WITH INSTRUMENTATION AND ALLOGRAFT;  Surgeon: Phylliss Bob, MD;  Location: [place];  Service: Orthopedics;  Laterality: N/A;  anterior  . CERVICAL POLYPECTOMY     INSIDE AND OUTSIDE  . COLONOSCOPY    . EYE SURGERY Bilateral    cataracts  . POLYPECTOMY    . REMOVED CARTLIAGE RIB       reports that she quit smoking about 29 years ago. Her smoking use included cigarettes. She has a 20.00 pack-year smoking history. She has never used smokeless tobacco. She reports that she does not drink alcohol and does not use drugs.  Allergies  Allergen Reactions  . Food  Shortness Of Breath    ALLERGY= MUSHROOMS  . Tequin Shortness Of Breath  . Codeine Nausea And Vomiting  . Erythromycin Other (See Comments)    GI UPSET  . Levaquin [Levofloxacin In D5w] Other (See Comments)    "made me want to die."  . Wellbutrin [Bupropion Hcl] Other (See Comments)    SHAKING  . Penicillins Rash    Reaction: Childhood  . Prednisone Rash    Family History  Problem Relation Age  of Onset  . Colon polyps Sister 63       PRECANCEROUS POLYPS  . Breast cancer Maternal Grandmother 42  . Emphysema Father   . Asthma Father   . Heart disease Father   . Heart disease Paternal Grandfather   . Colon cancer Neg Hx      Prior to Admission medications   Medication Sig Start Date End Date Taking? Authorizing Provider  acetaminophen (TYLENOL) 650 MG CR tablet Take 1,300 mg by mouth every 8 (eight) hours as needed for pain.    [provider]  albuterol (PROVENTIL HFA;VENTOLIN HFA) 108 (90 Base) MCG/ACT inhaler Inhale 2 puffs into the lungs every 6 (six) hours as needed for wheezing or shortness of breath. 03/05/18   Collene Gobble, MD  BESIVANCE 0.6 % SUSP Place 1 drop into the left eye See admin instructions. Instill 1 drop qid day of eye injection and qid the day after. 09/07/19   [provider]  Cyanocobalamin (VITAMIN B-12 PO) Take 2,500 mcg by mouth daily.    [provider]  DULoxetine (CYMBALTA) 60 MG capsule Take 60 mg by mouth every morning. 03/23/20   [provider]  fluticasone (FLONASE) 50 MCG/ACT nasal spray Place 1 spray into both nostrils daily. Patient taking differently: Place 1 spray into both nostrils 2 (two) times daily. 07/19/16   Collene Gobble, MD  furosemide (LASIX) 40 MG tablet Take 1 tablet (40 mg total) by mouth daily. 09/18/19 12/17/19  [place] Donnamarie Poag, DO  loratadine-pseudoephedrine (CLARITIN-D 12-HOUR) 5-120 MG per tablet Take 1 tablet by mouth daily.    [provider]  LORazepam (ATIVAN) 0.5 MG tablet Take 0.5-1 mg by mouth daily as needed for anxiety. Anxiety    [provider]  methocarbamol (ROBAXIN) 500 MG tablet Take 1 tablet (500 mg total) by mouth every 6 (six) hours as needed for muscle spasms. 06/15/20   McKenzie, Lennie Muckle, PA-C  metoprolol succinate (TOPROL-XL) 50 MG 24 hr tablet Take 1 tablet (50 mg total) by mouth daily. Take with or immediately following a meal. Patient taking  differently: Take 50 mg by mouth at bedtime. Take with or immediately following a meal. 11/24/19   Jerline Pain, MD  montelukast (SINGULAIR) 10 MG tablet Take 10 mg by mouth at bedtime. 02/21/14   [provider]  Multiple Vitamins-Minerals (OCUVITE PO) Take 1 tablet by mouth 2 (two) times daily.    [provider]  omeprazole (PRILOSEC) 20 MG capsule Take 20 mg by mouth at bedtime.    [provider]  ondansetron (ZOFRAN) 4 MG tablet Take 1 tablet (4 mg total) by mouth every 8 (eight) hours as needed for nausea or vomiting. 06/15/20   McKenzie, Lennie Muckle, PA-C  pravastatin (PRAVACHOL) 40 MG tablet Take 40 mg by mouth at bedtime.  09/04/10   [provider]  tizanidine (ZANAFLEX) 2 MG capsule Take 2 mg by mouth at bedtime as needed for muscle spasms (head pain per pt).    [provider]  TRELEGY ELLIPTA 100-62.5-25 MCG/INH AEPB INHALE 1 PUFF BY MOUTH EVERY DAY Patient taking differently: Inhale 1 puff into the lungs every morning. 02/04/20   Collene Gobble, MD    Physical Exam: Vitals:   08/13/20 0330 08/13/20 0400 08/13/20 0505 08/13/20 0618  BP: 131/78 134/84  (!) 115/57  Pulse: (!) 113 (!) 114    Resp: 16 10  18   Temp:   98.4 F (36.9 C) 98.1 F (36.7 C)  TempSrc:   Oral Oral  SpO2: 96% 95%  99%  Weight:    100.9 kg  Height:    5\' 2"  (1.575 m)     Constitutional: Awake alert and oriented x3, no associated distress.   Skin: Patient is notably diaphoretic without rashes or lesions seen.  Poor skin turgor noted.  Eyes: Pupils are equally reactive to light.  No evidence of scleral icterus or conjunctival pallor.  ENMT: Dry mucous membranes noted.  Posterior pharynx clear of any exudate or lesions.   Neck: normal, supple, no masses, no thyromegaly.  No evidence of jugular venous distension.   Respiratory: Notable bibasilar rales with expiratory wheezing and prolonged expiratory phase heard in all fields.  Some increased respiratory effort without  evidence of accessory muscle use.  Cardiovascular: Tachycardic rate with regular rhythm, no murmurs / rubs / gallops. No extremity edema. 2+ pedal pulses. No carotid bruits.  Chest:   Nontender without crepitus or deformity.  Back:   Nontender without crepitus or deformity. Abdomen: Abdomen is soft and nontender.  No evidence of intra-abdominal masses.  Positive bowel sounds noted in all quadrants.   Musculoskeletal: No joint deformity upper and lower extremities. Good ROM, no contractures. Normal muscle tone.  Neurologic: CN 2-12 grossly intact. Sensation intact.  Patient moving all 4 extremities spontaneously.  Patient is following all commands.  Patient is responsive to verbal stimuli.   Psychiatric: Patient exhibits normal mood with appropriate affect.  Patient seems to possess insight as to their current situation.     Labs on Admission: I have personally reviewed following labs and imaging studies -   CBC: Recent Labs  Lab 08/13/20 0150  WBC 11.7*  NEUTROABS 10.4*  HGB 13.6  HCT 42.1  MCV 85.6  PLT 952   Basic Metabolic Panel: Recent Labs  Lab 08/13/20 0150  NA 140  K 3.1*  CL 107  CO2 22  GLUCOSE 143*  BUN 12  CREATININE 0.80  CALCIUM 8.6*   GFR: Estimated Creatinine Clearance: 66.5 mL/min (by C-G formula based on SCr of 0.8 mg/dL). Liver Function Tests: Recent Labs  Lab 08/13/20 0150  AST 24  ALT 17  ALKPHOS 65  BILITOT 1.0  PROT 6.3*  ALBUMIN 3.7   No results for input(s): LIPASE, AMYLASE in the last 168 hours. No results for input(s): AMMONIA in the last 168 hours. Coagulation Profile: No results for input(s): INR, PROTIME in the last 168 hours. Cardiac Enzymes: No results for input(s): CKTOTAL, CKMB, CKMBINDEX, TROPONINI in the last 168 hours. BNP (last 3 results) No results for input(s): PROBNP in the last 8760 hours. HbA1C: No results for input(s): HGBA1C in the last 72 hours. CBG: No results for input(s): GLUCAP in the last 168 hours. Lipid  Profile: No results for input(s): CHOL, HDL, LDLCALC, TRIG, CHOLHDL, LDLDIRECT in the last 72 hours. Thyroid Function Tests: No results for input(s): TSH, T4TOTAL, FREET4, T3FREE, THYROIDAB in the last 72 hours. Anemia Panel: No results for input(s): VITAMINB12, FOLATE, FERRITIN, TIBC, IRON, RETICCTPCT  in the last 72 hours. Urine analysis:    Component Value Date/Time   COLORURINE AMBER (A) 06/14/2020 1145   APPEARANCEUR CLOUDY (A) 06/14/2020 1145   LABSPEC 1.027 06/14/2020 1145   PHURINE 5.0 06/14/2020 1145   GLUCOSEU NEGATIVE 06/14/2020 1145   HGBUR NEGATIVE 06/14/2020 1145   BILIRUBINUR NEGATIVE 06/14/2020 1145   KETONESUR NEGATIVE 06/14/2020 1145   PROTEINUR NEGATIVE 06/14/2020 1145   NITRITE NEGATIVE 06/14/2020 1145   LEUKOCYTESUR TRACE (A) 06/14/2020 1145    Radiological Exams on Admission - Personally Reviewed: DG Chest Portable 1 View  Result Date: 08/13/2020 CLINICAL DATA:  One week of cough and congestion with shortness of breath on exertion EXAM: PORTABLE CHEST 1 VIEW COMPARISON:  Chest radiograph March 01, 2020 and chest CT December 16, 2018. FINDINGS: The heart size and mediastinal contours are within normal limits. Aortic arch calcifications. Right lower lobe scarring. Bibasilar atelectasis. No focal consolidation or overt pulmonary edema. No significant pleural effusion or visualized pneumothorax. Anterior cervical fixation hardware. Degenerative changes bilateral glenohumeral and acromioclavicular joints. IMPRESSION: Bibasilar atelectasis and right lower lobe scarring. No definite acute findings. Electronically Signed   By: Dahlia Bailiff MD   On: 08/13/2020 01:49    EKG: Personally reviewed.  Rhythm is tachycardia with heart rate of 118 bpm.  T wave inversions noted in the lateral leads.    Assessment/Plan Principal Problem:   Pneumonia of both lower lobes due to infectious organism   Patient presenting with greater than 1 week history of generalized malaise,  weakness and cough productive with purulent sputum  Chest x-ray revealing patchy bilateral lower lobe infiltrates concerning for early pneumonia  Patient additionally exhibiting multiple SIRS criteria including tachycardia and leukocytosis with concurrent lactic acidosis all concerning for early sepsis  Patient has been placed on intravenous antibiotic therapy with azithromycin and ceftriaxone  Diuretics have been held and patient is being hydrated with intravenous isotonic fluids  Blood cultures have been obtained.  Sputum cultures have been ordered.  Providing patient with submental oxygen as necessary to achieve oxygen saturations between 89 and 92%  COVID-19 PCR testing found to be negative  Active Problems:   COPD with acute exacerbation (HCC)   Patient additionally felt to be suffering from concurrent COPD exacerbation with evidence of prolonged expiratory phase and wheezing on admission examination  Patient been placed on Solu-Medrol 40 mg IV every 12  Providing patient with aggressive bronchodilator therapy  Treating concurrent pneumonia as noted above  Supplemental oxygen with oxygen saturation between 89 and 92% as noted above.    Sepsis due to pneumonia [facility])   Concern for sepsis with likely causative organism being strep pneumoniae  Please see remainder of assessment and plan as above  Lactic acidosis  Patient exhibiting evidence of mild lactic acidosis of 2.6 likely secondary to underlying infection and volume depletion  Hydrating patient with intravenous isotonic fluids, treating patient with intravenous antibiotics  Performing serial lactic acid levels to ensure downtrending and resolution    Mixed hyperlipidemia  . Continuing home regimen of lipid lowering therapy.    AF (paroxysmal atrial fibrillation) ([place])   Patient is currently in sinus rhythm  Monitoring patient on telemetry  Continue home [place] home regimen of metoprolol     Major depressive disorder   Continue home  psychotropic regimen    Chronic diastolic CHF (congestive heart failure) (HCC)   No clinical evidence of cardiogenic volume overload at this time  Temporarily holding diuretics today as patient is being gently  hydrated with intravenous isotonic fluids.  Diuretics can be resumed tomorrow likely    Elevated troponin level not due myocardial infarction   Slight elevation in second troponin performed in the emergency department  Patient is chest pain-free  Flat trajectory of slight troponin elevation makes plaque rupture extremely unlikely and more likely secondary to supply demand mismatch due to ongoing infection.  Monitoring patient on telemetry    GERD without esophagitis   Continue home regimen of proton pump inhibitor   Code Status:  DNR Family Communication: deferred   Status is: Observation  The patient remains OBS appropriate and will d/c before 2 midnights.  Dispo: The patient is from: Home              Anticipated d/c is to: Home              Patient currently is not medically stable to d/c.   Difficult to place patient No        Vernelle Emerald MD [facility] Pager [phone]  If 7PM-7AM, please contact night-coverage [email or web address] Use universal  password for that web site. If you do not have the password, please call the hospital operator.  08/13/2020, 6:53 AM

## 2020-08-13 NOTE — ED Triage Notes (Addendum)
Pt BIB EMS from home. Cough and congestion for the past week, worsened with SOB (w/ exertion) over the past few days. Chest pressure 3/10. Normally on room air at baseline.  Hx COPD, CHF  Vitals: 88% on RA, 99% on 6L [place] with fire dept, 91% titrated back to RA with EMS HR 150 BP 114/70  EMS meds: 569mL bolus  324 aspirin  18G IV L arm

## 2020-08-13 NOTE — Evaluation (Signed)
Physical Therapy Evaluation Patient Details Name: [name] Molina MRN: [id number] DOB: 1946-03-01 Today's Date: 08/13/2020   History of Present Illness  Pt is a 75 y.o. F admitted 3/27 with generalized weakness and cough. Chest x-ray revealing patchy bilateral lower lobe infiltrates concerning for early PNA. Additionally felt to be experiencing a concurrent COPD exacerbation. Significant PMH: COPD, diastolic CHF, paroxysmal atrial fibrillation, depression.  Clinical Impression  PTA, pt lives with her daughter and grandchildren, is a household ambulator using a Radiation protection practitioner and is independent with ADL's. Pt presents with generalized weakness, balance deficits and decreased activity tolerance. Ambulating x 60 feet with a walker at a min guard assist level. SpO2 96-97% on RA, HR 103-106 bpm. Pt with increased diaphoresis and dyspnea on exertion towards end of walk. Recommend HHPT to maximize functional independence.     Follow Up Recommendations Home health PT;Supervision for mobility/OOB    Equipment Recommendations  None recommended by PT    Recommendations for Other Services       Precautions / Restrictions Precautions Precautions: Fall Restrictions Weight Bearing Restrictions: No      Mobility  Bed Mobility               General bed mobility comments: OOB in chair    Transfers Overall transfer level: Needs assistance Equipment used: Rolling walker (2 wheeled) Transfers: Sit to/from Stand Sit to Stand: Supervision         General transfer comment: Cues for hand placement  Ambulation/Gait Ambulation/Gait assistance: Min guard;Min assist Gait Distance (Feet): 60 Feet Assistive device: Rolling walker (2 wheeled) Gait Pattern/deviations: Step-through pattern;Decreased stride length;Wide base of support Gait velocity: decreased Gait velocity interpretation: <1.8 ft/sec, indicate of risk for recurrent falls General Gait Details: Slow and effortful pace, cues for walker  proximity, maintaining hands on walker during turns (pt with tendency to reach for external objects). Mostly min guard for balance, one episode of lateral LOB requiring minA to correct when she took one hand off the walker  Stairs            Wheelchair Mobility    Modified Rankin (Stroke Patients Only)       Balance Overall balance assessment: Needs assistance Sitting-balance support: Feet supported Sitting balance-Leahy Scale: Good     Standing balance support: Bilateral upper extremity supported Standing balance-Leahy Scale: Poor Standing balance comment: reliant on external support                             Pertinent Vitals/Pain Pain Assessment: No/denies pain    Home Living Family/patient expects to be discharged to:: Private residence Living Arrangements: Children (daughter) Available Help at Discharge: Family;Available PRN/intermittently Type of Home: House Home Access: Stairs to enter Entrance Stairs-Rails: None Entrance Stairs-Number of Steps: 1 Home Layout: Able to live on main level with bedroom/bathroom Home Equipment: Cane - single point;Walker - 4 wheels;Shower seat;Bedside commode;Grab bars - tub/shower      Prior Function Level of Independence: Needs assistance   Gait / Transfers Assistance Needed: household ambulator with Rollator  ADL's / Homemaking Assistance Needed: assist for IADL's, independent ADL's        Hand Dominance   Dominant Hand: Right    Extremity/Trunk Assessment   Upper Extremity Assessment Upper Extremity Assessment: Defer to OT evaluation    Lower Extremity Assessment Lower Extremity Assessment: Generalized weakness       Communication   Communication: No difficulties  Cognition Arousal/Alertness: Awake/alert Behavior During Therapy:  WFL for tasks assessed/performed Overall Cognitive Status: Within Functional Limits for tasks assessed                                         General Comments      Exercises     Assessment/Plan    PT Assessment Patient needs continued PT services  PT Problem List Decreased strength;Decreased activity tolerance;Decreased balance;Decreased mobility;Cardiopulmonary status limiting activity       PT Treatment Interventions DME instruction;Gait training;Stair training;Functional mobility training;Therapeutic activities;Therapeutic exercise;Balance training;Patient/family education    PT Goals (Current goals can be found in the Care Plan section)  Acute Rehab PT Goals Patient Stated Goal: feel better PT Goal Formulation: With patient Time For Goal Achievement: 08/27/20 Potential to Achieve Goals: Good    Frequency Min 3X/week   Barriers to discharge        Co-evaluation               AM-PAC PT "6 Clicks" Mobility  Outcome Measure Help needed turning from your back to your side while in a flat bed without using bedrails?: None Help needed moving from lying on your back to sitting on the side of a flat bed without using bedrails?: None Help needed moving to and from a bed to a chair (including a wheelchair)?: A Little Help needed standing up from a chair using your arms (e.g., wheelchair or bedside chair)?: A Little Help needed to walk in hospital room?: A Little Help needed climbing 3-5 steps with a railing? : A Lot 6 Click Score: 19    End of Session   Activity Tolerance: Patient tolerated treatment well Patient left: in chair;with call bell/phone within reach Nurse Communication: Mobility status PT Visit Diagnosis: Unsteadiness on feet (R26.81);Muscle weakness (generalized) (M62.81);Difficulty in walking, not elsewhere classified (R26.2)    Time: 0174-9449 PT Time Calculation (min) (ACUTE ONLY): 28 min   Charges:   PT Evaluation $PT Eval Moderate Complexity: 1 Mod PT Treatments $Gait Training: 8-22 mins        Wyona Almas, PT, DPT Acute Rehabilitation Services Pager [phone] Office  [phone]   Deno Etienne 08/13/2020, 12:08 PM

## 2020-08-14 DIAGNOSIS — Z20822 Contact with and (suspected) exposure to covid-19: Secondary | ICD-10-CM | POA: Diagnosis present

## 2020-08-14 DIAGNOSIS — E785 Hyperlipidemia, unspecified: Secondary | ICD-10-CM | POA: Diagnosis not present

## 2020-08-14 DIAGNOSIS — I5033 Acute on chronic diastolic (congestive) heart failure: Secondary | ICD-10-CM | POA: Diagnosis present

## 2020-08-14 DIAGNOSIS — D509 Iron deficiency anemia, unspecified: Secondary | ICD-10-CM | POA: Diagnosis not present

## 2020-08-14 DIAGNOSIS — G43909 Migraine, unspecified, not intractable, without status migrainosus: Secondary | ICD-10-CM | POA: Diagnosis present

## 2020-08-14 DIAGNOSIS — Z85828 Personal history of other malignant neoplasm of skin: Secondary | ICD-10-CM | POA: Diagnosis not present

## 2020-08-14 DIAGNOSIS — F331 Major depressive disorder, recurrent, moderate: Secondary | ICD-10-CM | POA: Diagnosis not present

## 2020-08-14 DIAGNOSIS — I5032 Chronic diastolic (congestive) heart failure: Secondary | ICD-10-CM | POA: Diagnosis not present

## 2020-08-14 DIAGNOSIS — G4730 Sleep apnea, unspecified: Secondary | ICD-10-CM | POA: Diagnosis present

## 2020-08-14 DIAGNOSIS — Z888 Allergy status to other drugs, medicaments and biological substances status: Secondary | ICD-10-CM | POA: Diagnosis not present

## 2020-08-14 DIAGNOSIS — J441 Chronic obstructive pulmonary disease with (acute) exacerbation: Secondary | ICD-10-CM | POA: Diagnosis present

## 2020-08-14 DIAGNOSIS — Z91018 Allergy to other foods: Secondary | ICD-10-CM | POA: Diagnosis not present

## 2020-08-14 DIAGNOSIS — K219 Gastro-esophageal reflux disease without esophagitis: Secondary | ICD-10-CM | POA: Diagnosis not present

## 2020-08-14 DIAGNOSIS — Z66 Do not resuscitate: Secondary | ICD-10-CM | POA: Diagnosis present

## 2020-08-14 DIAGNOSIS — Z825 Family history of asthma and other chronic lower respiratory diseases: Secondary | ICD-10-CM | POA: Diagnosis not present

## 2020-08-14 DIAGNOSIS — Z8371 Family history of colonic polyps: Secondary | ICD-10-CM | POA: Diagnosis not present

## 2020-08-14 DIAGNOSIS — J4520 Mild intermittent asthma, uncomplicated: Secondary | ICD-10-CM | POA: Diagnosis not present

## 2020-08-14 DIAGNOSIS — J9601 Acute respiratory failure with hypoxia: Secondary | ICD-10-CM | POA: Diagnosis not present

## 2020-08-14 DIAGNOSIS — Z880 Allergy status to penicillin: Secondary | ICD-10-CM | POA: Diagnosis not present

## 2020-08-14 DIAGNOSIS — E872 Acidosis: Secondary | ICD-10-CM | POA: Diagnosis present

## 2020-08-14 DIAGNOSIS — R778 Other specified abnormalities of plasma proteins: Secondary | ICD-10-CM | POA: Diagnosis not present

## 2020-08-14 DIAGNOSIS — A419 Sepsis, unspecified organism: Secondary | ICD-10-CM | POA: Diagnosis not present

## 2020-08-14 DIAGNOSIS — M19042 Primary osteoarthritis, left hand: Secondary | ICD-10-CM | POA: Diagnosis not present

## 2020-08-14 DIAGNOSIS — Z881 Allergy status to other antibiotic agents status: Secondary | ICD-10-CM | POA: Diagnosis not present

## 2020-08-14 DIAGNOSIS — I110 Hypertensive heart disease with heart failure: Secondary | ICD-10-CM | POA: Diagnosis present

## 2020-08-14 DIAGNOSIS — E782 Mixed hyperlipidemia: Secondary | ICD-10-CM | POA: Diagnosis present

## 2020-08-14 DIAGNOSIS — J449 Chronic obstructive pulmonary disease, unspecified: Secondary | ICD-10-CM | POA: Diagnosis not present

## 2020-08-14 DIAGNOSIS — F329 Major depressive disorder, single episode, unspecified: Secondary | ICD-10-CM | POA: Diagnosis not present

## 2020-08-14 DIAGNOSIS — E869 Volume depletion, unspecified: Secondary | ICD-10-CM | POA: Diagnosis present

## 2020-08-14 DIAGNOSIS — R0602 Shortness of breath: Secondary | ICD-10-CM | POA: Diagnosis not present

## 2020-08-14 DIAGNOSIS — I10 Essential (primary) hypertension: Secondary | ICD-10-CM | POA: Diagnosis not present

## 2020-08-14 DIAGNOSIS — I480 Paroxysmal atrial fibrillation: Secondary | ICD-10-CM | POA: Diagnosis not present

## 2020-08-14 DIAGNOSIS — M19041 Primary osteoarthritis, right hand: Secondary | ICD-10-CM | POA: Diagnosis not present

## 2020-08-14 DIAGNOSIS — J189 Pneumonia, unspecified organism: Secondary | ICD-10-CM | POA: Diagnosis not present

## 2020-08-14 DIAGNOSIS — M1990 Unspecified osteoarthritis, unspecified site: Secondary | ICD-10-CM | POA: Diagnosis present

## 2020-08-14 MED ORDER — MAGNESIUM SULFATE 2 GM/50ML IV SOLN
2.0000 g | Freq: Once | INTRAVENOUS | Status: AC
  Administered 2020-08-14: 2 g via INTRAVENOUS
  Filled 2020-08-14: qty 50

## 2020-08-14 MED ORDER — METHYLPREDNISOLONE SODIUM SUCC 40 MG IJ SOLR
40.0000 mg | Freq: Three times a day (TID) | INTRAMUSCULAR | Status: DC
  Administered 2020-08-14 – 2020-08-16 (×7): 40 mg via INTRAVENOUS
  Filled 2020-08-14 (×6): qty 1

## 2020-08-14 MED ORDER — FUROSEMIDE 40 MG PO TABS
40.0000 mg | ORAL_TABLET | Freq: Every day | ORAL | Status: DC
  Administered 2020-08-14: 40 mg via ORAL
  Filled 2020-08-14: qty 1

## 2020-08-14 MED ORDER — IPRATROPIUM-ALBUTEROL 0.5-2.5 (3) MG/3ML IN SOLN
3.0000 mL | Freq: Two times a day (BID) | RESPIRATORY_TRACT | Status: DC
  Administered 2020-08-14 – 2020-08-16 (×4): 3 mL via RESPIRATORY_TRACT
  Filled 2020-08-14 (×4): qty 3

## 2020-08-14 MED ORDER — IPRATROPIUM-ALBUTEROL 0.5-2.5 (3) MG/3ML IN SOLN
3.0000 mL | Freq: Four times a day (QID) | RESPIRATORY_TRACT | Status: DC
  Administered 2020-08-14: 3 mL via RESPIRATORY_TRACT
  Filled 2020-08-14 (×2): qty 3

## 2020-08-14 MED ORDER — METHYLPREDNISOLONE SODIUM SUCC 40 MG IJ SOLR: 40.0000 mg | Freq: Three times a day (TID) | INTRAMUSCULAR | Status: DC

## 2020-08-14 MED ORDER — ACETAMINOPHEN 325 MG PO TABS
650.0000 mg | ORAL_TABLET | Freq: Four times a day (QID) | ORAL | Status: DC | PRN
  Administered 2020-08-14: 650 mg via ORAL
  Filled 2020-08-14: qty 2

## 2020-08-14 NOTE — Progress Notes (Signed)
PROGRESS NOTE                                                                             PROGRESS NOTE                                                                                                                                                                                                             Patient Demographics:    [name] Molina, is a 75 y.o. female, DOB - 08/23/1945, GEX:[id number]  Outpatient Primary MD for the patient is Harlan Stains, MD    LOS - 0  Admit date - 08/13/2020    No chief complaint on file.      Brief Narrative     75 year old female with past medical history of COPD, diastolic congestive heart failure (Echo 08/2019 EF 65-70%), paroxysmal atrial fibrillation, macular degeneration, hyperlipidemia, gastroesophageal reflux disease, depression who presents to [facility] emergency department with complaints of generalized weakness and cough.  Patient explains that the over a week ago she began to develop a cough.  Initially cough was mild in intensity but as the days progressed cough became particularly severe, productive with thick yellow sputum.  This was associated with progressively worsening severe generalized weakness and poor appetite.  Patient also complains of development of wheezing and some shortness of breath over this span of time.  Patient also complaining of associated subjective fevers and chills as well.  Patient denies sick contacts, recent travel or confirmed contact with COVID-19 infection.  Patient symptoms continued to progress until patient eventually presented to [facility] emergency department for evaluation.  Upon evaluation in the emergency department patient was found to be tachycardic with mild fever of 100.4 F as well as evidence of lactic acidosis and questionable bibasilar infiltrates on chest x-ray.  There is concern clinically for developing pneumonia and early sepsis and  therefore the patient was initiated on intravenous ceftriaxone and azithromycin.  Hospitalist group was then called to assess the patient for admission the hospital.    Subjective:  [name] Molina today reports she feels her dyspnea is worse today, she still reports some cough, reports she has felt feverish (no fever is recorded ).  Does report mild headache as well.   Assessment  & Plan :    Principal Problem:   Pneumonia of both lower lobes due to infectious organism Active Problems:   COPD with acute exacerbation (HCC)   GERD without esophagitis   Mixed hyperlipidemia   Sepsis due to pneumonia (HCC)   AF (paroxysmal atrial fibrillation) (HCC)   Major depressive disorder   Chronic diastolic CHF (congestive heart failure) (HCC)   Elevated troponin level not due myocardial infarction  She is due to pneumonia of both lower lobes due to infectious organism - Patient presenting with greater than 1 week history of generalized malaise, weakness and cough productive with purulent sputum -Chest x-ray revealing patchy bilateral lower lobe infiltrates concerning for early pneumonia - Patient additionally exhibiting multiple SIRS criteria including tachycardia and leukocytosis with concurrent lactic acidosis all concerning for early sepsis, lactic acid has normalized -Reports significant cough started on as needed cough medicine -Continue with IV Rocephin and azithromycin, follow on sputum cultures, blood cultures remain negative. -Was encouraged to use incentive spirometry and flutter valve today.  COPD with acute exacerbation   - Patient additionally felt to be suffering from concurrent COPD exacerbation with evidence of prolonged expiratory phase and wheezing on admission examination -She remains with wheezing today, reports she is feeling more dyspneic, I will increase her IV Solu-Medrol to 40 mg IV every 8 hours, will start her on scheduled duo nebs as well  Mixed hyperlipidemia -  Continuing home regimen of lipid lowering therapy.  AF (paroxysmal atrial fibrillation) ([place]) -Patient is currently in sinus rhythm -Continue with apixaban and metoprolol  Major depressive disorder - Continue home  psychotropic regimen  Chronic diastolic CHF (congestive heart failure) (HCC) - No clinical evidence of cardiogenic volume overload at this time -Now lactic acid has normalized, resume her home diuretic regimen  Elevated troponin level not due myocardial infarction - Slight elevation in second troponin performed in the emergency department -She is chest pain-free, this is supply demand due to sepsis.  GERD without esophagitis - Continue home regimen of proton pump inhibitor  Hypomagnesemia -With potassium of 1.6, repleted  SpO2: 100 % O2 Flow Rate (L/min): 2 L/min FiO2 (%): 21 %  Recent Labs  Lab 08/13/20 0150 08/13/20 0213 08/13/20 0334 08/13/20 0702 08/13/20 1146  WBC 11.7*  --   --  11.4*  --   PLT 178  --   --  178  --   BNP 135.2*  --   --   --   --   DDIMER  --   --  <0.27  --   --   PROCALCITON  --   --   --  0.25  --   AST 24  --   --  20  --   ALT 17  --   --  14  --   ALKPHOS 65  --   --  56  --   BILITOT 1.0  --   --  0.9  --   ALBUMIN 3.7  --   --  3.1*  --   INR  --   --   --  1.6*  --   LATICACIDVEN 2.6*  --  2.0*  --  1.8  SARSCOV2NAA  --  NEGATIVE  --   --   --  FiO2 (%):  [21 %] 21 %  ABG  No results found for: PHART, PCO2ART, PO2ART, HCO3, TCO2, ACIDBASEDEF, O2SAT         Condition - Extremely Guarded  Family Communication  :  None at bedside  Code Status :  DNR  Consults  : none  Disposition Plan  :    Status is: Observation  The patient will require care spanning > 2 midnights and should be moved to inpatient because: IV treatments appropriate due to intensity of illness or inability to take PO  Dispo: The patient is from: Home              Anticipated d/c is to: Home              Patient currently is  not medically stable to d/c.  Remains  with wheezing, dyspneic, requiring IV steroids and antibiotics and close monitoring.   Difficult to place patient No      DVT Prophylaxis  :  ELIQUIS  Lab Results  Component Value Date   PLT 178 08/13/2020    Diet :  Diet Order            Diet Heart Room service appropriate? Yes; Fluid consistency: Thin  Diet effective now                  Inpatient Medications  Scheduled Meds: . apixaban  5 mg Oral BID  . DULoxetine  60 mg Oral q morning  . fluticasone  1 spray Each Nare Daily  . fluticasone furoate-vilanterol  1 puff Inhalation Daily  . methylPREDNISolone (SOLU-MEDROL) injection  40 mg Intravenous Q8H  . metoprolol succinate  50 mg Oral QHS  . montelukast  10 mg Oral QHS  . pantoprazole  40 mg Oral QHS  . pravastatin  40 mg Oral QHS  . umeclidinium bromide  1 puff Inhalation Daily  . vitamin B-12  100 mcg Oral Daily   Continuous Infusions: . azithromycin 500 mg (08/14/20 0840)  . cefTRIAXone (ROCEPHIN)  IV 2 g (08/13/20 2139)  . magnesium sulfate bolus IVPB 2 g (08/14/20 0957)   PRN Meds:.albuterol, guaiFENesin-dextromethorphan, loratadine, LORazepam, methocarbamol, pseudoephedrine  Antibiotics  :    Anti-infectives (From admission, onward)   Start     Dose/Rate Route Frequency Ordered Stop   08/13/20 0315  cefTRIAXone (ROCEPHIN) 2 g in sodium chloride 0.9 % 100 mL IVPB        2 g 200 mL/hr over 30 Minutes Intravenous Every 24 hours 08/13/20 0305     08/13/20 0315  azithromycin (ZITHROMAX) 500 mg in sodium chloride 0.9 % 250 mL IVPB        500 mg 250 mL/hr over 60 Minutes Intravenous Every 24 hours 08/13/20 0305         Phillips Climes M.D on 08/14/2020 at 10:15 AM  To page go to [email or web address]   [facility] -  Office  [phone]      Objective:   Vitals:   08/13/20 1431 08/13/20 2026 08/13/20 2120 08/14/20 0825  BP:   (!) 108/54 108/63  Pulse:   94 84  Resp:   18 19  Temp:   97.8 F (36.6 C)  97.6 F (36.4 C)  TempSrc:   Oral Oral  SpO2: 99% 98% 96% 100%  Weight:      Height:        Wt Readings from Last 3 Encounters:  08/13/20 100.9 kg  06/15/20 100.5 kg  06/14/20 100.5 kg  No intake or output data in the 24 hours ending 08/14/20 1015   Physical Exam  Awake Alert, Oriented X 3, No new F.N deficits, Normal affect Symmetrical Chest wall movement, she is with some diminished air entry today, remains with wheezing.   RRR,No Gallops,Rubs or new Murmurs, No Parasternal Heave +ve B.Sounds, Abd Soft, No tenderness, No rebound - guarding or rigidity. No Cyanosis, Clubbing or edema, No new Rash or bruise      Data Review:    CBC Recent Labs  Lab 08/13/20 0150 08/13/20 0702  WBC 11.7* 11.4*  HGB 13.6 12.0  HCT 42.1 35.5*  PLT 178 178  MCV 85.6 84.7  MCH 27.6 28.6  MCHC 32.3 33.8  RDW 15.3 15.5  LYMPHSABS 0.4* 0.9  MONOABS 0.7 0.9  EOSABS 0.0 0.0  BASOSABS 0.0 0.1    Recent Labs  Lab 08/13/20 0150 08/13/20 0334 08/13/20 0702 08/13/20 1146  NA 140  --  142  --   K 3.1*  --  3.5  --   CL 107  --  110  --   CO2 22  --  24  --   GLUCOSE 143*  --  118*  --   BUN 12  --  13  --   CREATININE 0.80  --  0.87  --   CALCIUM 8.6*  --  8.1*  --   AST 24  --  20  --   ALT 17  --  14  --   ALKPHOS 65  --  56  --   BILITOT 1.0  --  0.9  --   ALBUMIN 3.7  --  3.1*  --   MG  --   --  1.6*  --   DDIMER  --  <0.27  --   --   PROCALCITON  --   --  0.25  --   LATICACIDVEN 2.6* 2.0*  --  1.8  INR  --   --  1.6*  --   BNP 135.2*  --   --   --     ------------------------------------------------------------------------------------------------------------------ No results for input(s): CHOL, HDL, LDLCALC, TRIG, CHOLHDL, LDLDIRECT in the last 72 hours.  No results found for: HGBA1C ------------------------------------------------------------------------------------------------------------------ No results for input(s): TSH, T4TOTAL, T3FREE, THYROIDAB in the  last 72 hours.  Invalid input(s): FREET3  Cardiac Enzymes No results for input(s): CKMB, TROPONINI, MYOGLOBIN in the last 168 hours.  Invalid input(s): CK ------------------------------------------------------------------------------------------------------------------    Component Value Date/Time   BNP 135.2 (H) 08/13/2020 0150    Micro Results Recent Results (from the past 240 hour(s))  Culture, blood (routine x 2)     Status: None (Preliminary result)   Collection Time: 08/13/20  1:50 AM   Specimen: BLOOD  Result Value Ref Range Status   Specimen Description BLOOD SITE NOT SPECIFIED  Final   Special Requests   Final    BOTTLES DRAWN AEROBIC AND ANAEROBIC Blood Culture results may not be optimal due to an excessive volume of blood received in culture bottles   Culture   Final    NO GROWTH 1 DAY Performed at [facility], [place] [address], [place], [place] [address]    Report Status PENDING  Incomplete  Resp Panel by RT-PCR (Flu A&B, Covid) Nasopharyngeal Swab     Status: None   Collection Time: 08/13/20  2:13 AM   Specimen: Nasopharyngeal Swab; Nasopharyngeal(NP) swabs in vial transport medium  Result Value Ref Range Status   SARS Coronavirus 2 by  RT PCR NEGATIVE NEGATIVE Final    Comment: (NOTE) SARS-CoV-2 target nucleic acids are NOT DETECTED.  The SARS-CoV-2 RNA is generally detectable in upper respiratory specimens during the acute phase of infection. The lowest concentration of SARS-CoV-2 viral copies this assay can detect is 138 copies/mL. A negative result does not preclude SARS-Cov-2 infection and should not be used as the sole basis for treatment or other patient management decisions. A negative result may occur with  improper specimen collection/handling, submission of specimen other than nasopharyngeal swab, presence of viral mutation(s) within the areas targeted by this assay, and inadequate number of viral copies(<138 copies/mL). A negative result must  be combined with clinical observations, patient history, and epidemiological information. The expected result is Negative.  Fact Sheet for Patients:  EntrepreneurPulse.com.au  Fact Sheet for Healthcare Providers:  IncredibleEmployment.be  This test is no t yet approved or cleared by the [place] FDA and  has been authorized for detection and/or diagnosis of SARS-CoV-2 by FDA under an Emergency Use Authorization (EUA). This EUA will remain  in effect (meaning this test can be used) for the duration of the COVID-19 declaration under Section 564(b)(1) of the Act, 21 U.S.C.section 360bbb-3(b)(1), unless the authorization is terminated  or revoked sooner.       Influenza A by PCR NEGATIVE NEGATIVE Final   Influenza B by PCR NEGATIVE NEGATIVE Final    Comment: (NOTE) The Xpert Xpress SARS-CoV-2/FLU/RSV plus assay is intended as an aid in the diagnosis of influenza from Nasopharyngeal swab specimens and should not be used as a sole basis for treatment. Nasal washings and aspirates are unacceptable for Xpert Xpress SARS-CoV-2/FLU/RSV testing.  Fact Sheet for Patients: EntrepreneurPulse.com.au  Fact Sheet for Healthcare Providers: IncredibleEmployment.be  This test is not yet approved or cleared by the [place] FDA and has been authorized for detection and/or diagnosis of SARS-CoV-2 by FDA under an Emergency Use Authorization (EUA). This EUA will remain in effect (meaning this test can be used) for the duration of the COVID-19 declaration under Section 564(b)(1) of the Act, 21 U.S.C. section 360bbb-3(b)(1), unless the authorization is terminated or revoked.  Performed at [facility], [place] [address] [place], [place] [address]   Culture, blood (routine x 2)     Status: None (Preliminary result)   Collection Time: 08/13/20  3:40 AM   Specimen: BLOOD RIGHT HAND  Result Value Ref Range Status    Specimen Description BLOOD RIGHT HAND  Final   Special Requests   Final    BOTTLES DRAWN AEROBIC AND ANAEROBIC Blood Culture adequate volume   Culture   Final    NO GROWTH 1 DAY Performed at [facility], [place] [address], [place], [place] [address]    Report Status PENDING  Incomplete  Expectorated Sputum Assessment w Gram Stain, Rflx to Resp Cult     Status: None   Collection Time: 08/13/20  8:12 AM   Specimen: Expectorated Sputum  Result Value Ref Range Status   Specimen Description EXPECTORATED SPUTUM  Final   Special Requests NONE  Final   Sputum evaluation   Final    THIS SPECIMEN IS ACCEPTABLE FOR SPUTUM CULTURE Performed at [facility], [place] [address], [place],  [address]    Report Status 08/13/2020 FINAL  Final  Culture, Respiratory w Gram Stain     Status: None (Preliminary result)   Collection Time: 08/13/20  8:12 AM  Result Value Ref Range Status   Specimen Description EXPECTORATED SPUTUM  Final   Special Requests NONE Reflexed from [id number]  Final   Gram Stain   Final    ABUNDANT WBC PRESENT, PREDOMINANTLY PMN RARE SQUAMOUS EPITHELIAL CELLS PRESENT RARE GRAM POSITIVE RODS RARE GRAM POSITIVE COCCI IN PAIRS RARE GRAM NEGATIVE RODS    Culture   Final    RARE Normal respiratory flora-no Staph aureus or Pseudomonas seen Performed at [facility], [address] [place], [place] [address]    Report Status PENDING  Incomplete    Radiology Reports DG Chest Portable 1 View  Result Date: 08/13/2020 CLINICAL DATA:  One week of cough and congestion with shortness of breath on exertion EXAM: PORTABLE CHEST 1 VIEW COMPARISON:  Chest radiograph March 01, 2020 and chest CT December 16, 2018. FINDINGS: The heart size and mediastinal contours are within normal limits. Aortic arch calcifications. Right lower lobe scarring. Bibasilar atelectasis. No focal consolidation or overt pulmonary edema. No significant pleural effusion or visualized pneumothorax. Anterior  cervical fixation hardware. Degenerative changes bilateral glenohumeral and acromioclavicular joints. IMPRESSION: Bibasilar atelectasis and right lower lobe scarring. No definite acute findings. Electronically Signed   By: Dahlia Bailiff MD   On: 08/13/2020 01:49

## 2020-08-14 NOTE — Plan of Care (Signed)
  Problem: Education: Goal: Knowledge of General Education information will improve Description Including pain rating scale, medication(s)/side effects and non-pharmacologic comfort measures Outcome: Progressing   

## 2020-08-14 NOTE — Care Management Obs Status (Signed)
[place] NOTIFICATION   Patient Details  Name: ELISSE PENNICK MRN: [id number] Date of Birth: 1946/04/18   Medicare Observation Status Notification Given:  Yes    Joanne Chars, [place] 08/14/2020, 2:01 PM

## 2020-08-15 DIAGNOSIS — F331 Major depressive disorder, recurrent, moderate: Secondary | ICD-10-CM | POA: Diagnosis not present

## 2020-08-15 DIAGNOSIS — M19041 Primary osteoarthritis, right hand: Secondary | ICD-10-CM | POA: Diagnosis not present

## 2020-08-15 DIAGNOSIS — F329 Major depressive disorder, single episode, unspecified: Secondary | ICD-10-CM | POA: Diagnosis not present

## 2020-08-15 DIAGNOSIS — E785 Hyperlipidemia, unspecified: Secondary | ICD-10-CM | POA: Diagnosis not present

## 2020-08-15 DIAGNOSIS — D509 Iron deficiency anemia, unspecified: Secondary | ICD-10-CM | POA: Diagnosis not present

## 2020-08-15 DIAGNOSIS — J441 Chronic obstructive pulmonary disease with (acute) exacerbation: Secondary | ICD-10-CM | POA: Diagnosis not present

## 2020-08-15 DIAGNOSIS — J4520 Mild intermittent asthma, uncomplicated: Secondary | ICD-10-CM | POA: Diagnosis not present

## 2020-08-15 DIAGNOSIS — J9601 Acute respiratory failure with hypoxia: Secondary | ICD-10-CM

## 2020-08-15 DIAGNOSIS — M19042 Primary osteoarthritis, left hand: Secondary | ICD-10-CM | POA: Diagnosis not present

## 2020-08-15 DIAGNOSIS — I10 Essential (primary) hypertension: Secondary | ICD-10-CM | POA: Diagnosis not present

## 2020-08-15 DIAGNOSIS — J449 Chronic obstructive pulmonary disease, unspecified: Secondary | ICD-10-CM | POA: Diagnosis not present

## 2020-08-15 DIAGNOSIS — I480 Paroxysmal atrial fibrillation: Secondary | ICD-10-CM | POA: Diagnosis not present

## 2020-08-15 DIAGNOSIS — K219 Gastro-esophageal reflux disease without esophagitis: Secondary | ICD-10-CM | POA: Diagnosis not present

## 2020-08-15 LAB — BASIC METABOLIC PANEL
Anion gap: 10 (ref 5–15)
BUN: 15 mg/dL (ref 8–23)
CO2: 25 mmol/L (ref 22–32)
Calcium: 8.8 mg/dL — ABNORMAL LOW (ref 8.9–10.3)
Chloride: 107 mmol/L (ref 98–111)
Creatinine, Ser: 0.92 mg/dL (ref 0.44–1.00)
GFR, Estimated: >60 mL/min (ref >60)
Glucose, Bld: 250 mg/dL — ABNORMAL HIGH (ref 70–99)
Potassium: 4.2 mmol/L (ref 3.5–5.1)
Sodium: 142 mmol/L (ref 135–145)

## 2020-08-15 LAB — CBC
HCT: 35.7 % — ABNORMAL LOW (ref 36.0–46.0)
Hemoglobin: 11.4 g/dL — ABNORMAL LOW (ref 12.0–15.0)
MCH: 28 pg (ref 26.0–34.0)
MCHC: 31.9 g/dL (ref 30.0–36.0)
MCV: 87.7 fL (ref 80.0–100.0)
Platelets: 204 10*3/uL (ref 150–400)
RBC: 4.07 MIL/uL (ref 3.87–5.11)
RDW: 15.4 % (ref 11.5–15.5)
WBC: 11.1 10*3/uL — ABNORMAL HIGH (ref 4.0–10.5)
nRBC: 0 % (ref 0.0–0.2)

## 2020-08-15 LAB — GLUCOSE, CAPILLARY
Glucose-Capillary: 185 mg/dL — ABNORMAL HIGH (ref 70–99)
Glucose-Capillary: 196 mg/dL — ABNORMAL HIGH (ref 70–99)
Glucose-Capillary: 290 mg/dL — ABNORMAL HIGH (ref 70–99)

## 2020-08-15 LAB — HEMOGLOBIN A1C
Hgb A1c MFr Bld: 5.7 % — ABNORMAL HIGH (ref 4.8–5.6)
Mean Plasma Glucose: 116.89 mg/dL

## 2020-08-15 LAB — CULTURE, RESPIRATORY W GRAM STAIN: Culture: NORMAL

## 2020-08-15 MED ORDER — ADULT MULTIVITAMIN W/MINERALS CH
1.0000 | ORAL_TABLET | Freq: Every day | ORAL | Status: DC
  Administered 2020-08-15 – 2020-08-16 (×2): 1 via ORAL
  Filled 2020-08-15 (×2): qty 1

## 2020-08-15 MED ORDER — GUAIFENESIN ER 600 MG PO TB12
1200.0000 mg | ORAL_TABLET | Freq: Two times a day (BID) | ORAL | Status: DC
  Administered 2020-08-15 – 2020-08-16 (×3): 1200 mg via ORAL
  Filled 2020-08-15 (×3): qty 2

## 2020-08-15 MED ORDER — FUROSEMIDE 10 MG/ML IJ SOLN
40.0000 mg | Freq: Once | INTRAMUSCULAR | Status: AC
  Administered 2020-08-15: 40 mg via INTRAVENOUS
  Filled 2020-08-15: qty 4

## 2020-08-15 MED ORDER — INSULIN ASPART 100 UNIT/ML ~~LOC~~ SOLN
0.0000 [IU] | Freq: Three times a day (TID) | SUBCUTANEOUS | Status: DC
  Administered 2020-08-15 – 2020-08-16 (×2): 3 [IU] via SUBCUTANEOUS
  Administered 2020-08-16: 5 [IU] via SUBCUTANEOUS

## 2020-08-15 MED ORDER — INSULIN ASPART 100 UNIT/ML ~~LOC~~ SOLN
0.0000 [IU] | Freq: Every day | SUBCUTANEOUS | Status: DC
  Administered 2020-08-16: 3 [IU] via SUBCUTANEOUS

## 2020-08-15 MED ORDER — AZITHROMYCIN 250 MG PO TABS
500.0000 mg | ORAL_TABLET | Freq: Every day | ORAL | Status: DC
  Administered 2020-08-15 – 2020-08-16 (×2): 500 mg via ORAL
  Filled 2020-08-15 (×2): qty 2

## 2020-08-15 MED ORDER — ENSURE ENLIVE PO LIQD
237.0000 mL | ORAL | Status: DC
  Administered 2020-08-15: 237 mL via ORAL

## 2020-08-15 NOTE — Progress Notes (Signed)
Initial Nutrition Assessment  DOCUMENTATION CODES:  Not applicable  INTERVENTION:  Ensure Enlive po 1x/d, each supplement provides 350 kcal and 20 grams of protein  Multivitamin with minerals PO daily  NUTRITION DIAGNOSIS:  Inadequate oral intake related to decreased appetite as evidenced by per patient/family report.  GOAL:  Patient will meet greater than or equal to 90% of their needs  MONITOR:  PO intake,Supplement acceptance  REASON FOR ASSESSMENT:  Consult Assessment of nutrition requirement/status  ASSESSMENT:  Pt presented for worsening SOB at home. Reports cough/congestion worsening for 1 week PTA accompanied by weakness and poor PO intake. In ED, lactic acid elevated and imaging suggestive of developing pneumonia.   PMH significant for CHF, COPD, atrial fibrillation, HLD, GERD.   Pt resting in bedside chair at the time of visit. Pt reports relatively good intake of breakfast (~75%) and dinner (~50%) last night. States that appetite is improved from the week PTA, but not back to baseline. Pt's reported typical intake at home appears low in energy and overall nutrition value. Reports eating 3x/d at home: a poptart (with edges broken off) for breakfast, a cheese stick and 3 crackers for lunch, and then a fast food meal for dinner. Pt states that she lives with her daughter and two grandsons at baseline. Uses a rolator to ambulate, states that she has noticed a decrease in her energy level recently. Discussed recent weight hx, pt reports that last year she weighed 237 lbs and intentionally lost to her UBW of ~210 lbs. Medications reviewed and include: insulin, prednisone, vitamin b12  Labs reviewed. Relevant results include: elevated glucose (250mg /dL this AM) - likely due to addition of steroids added 3/28, insulin added this AM  NUTRITION - FOCUSED PHYSICAL EXAM: Flowsheet Row Most Recent Value  Orbital Region No depletion  Upper Arm Region No depletion  Thoracic and Lumbar  Region No depletion  Buccal Region No depletion  Temple Region No depletion  Clavicle Bone Region No depletion  Clavicle and Acromion Bone Region No depletion  Scapular Bone Region No depletion  Dorsal Hand No depletion  Patellar Region No depletion  Anterior Thigh Region No depletion  Posterior Calf Region No depletion  Edema (RD Assessment) Mild  [swelling to the feet and ankles]  Hair Reviewed  Eyes Reviewed  Mouth Reviewed  [scattered white patches to the tongue]  Skin Reviewed  Nails Reviewed     Diet Order:   Diet Order            Diet Heart Room service appropriate? Yes; Fluid consistency: Thin  Diet effective now                EDUCATION NEEDS:  No education needs have been identified at this time  Skin:  Skin Assessment: Reviewed RN Assessment (dry flakey skin to the bilateral feet and ankles)  Last BM:  3/29 per pt  Height:  Ht Readings from Last 1 Encounters:  08/13/20 5\' 2"  (1.575 m)   Weight:  Wt Readings from Last 1 Encounters:  08/13/20 100.9 kg   Ideal Body Weight:  50 kg / 110 lb   BMI:  Body mass index is 40.69 kg/m.  Estimated Nutritional Needs:   Kcal:  1500-1700 kcal  Protein:  75-80 grams  Fluid:  >1553mL   Ranell Patrick, RD, LDN Clinical Dietitian 08/15/2020 1:33 PM

## 2020-08-15 NOTE — Progress Notes (Signed)
SATURATION QUALIFICATIONS: (This note is used to comply with regulatory documentation for home oxygen)  Patient Saturations on Room Air at Rest = 92%  Patient Saturations on Room Air while Ambulating = 82%  Patient Saturations on 3 Liters of oxygen while Ambulating = 93%  Please briefly explain why patient needs home oxygen: Pt ambulated 15 feet and needed to stop sit and catch her breath. Approx. 1 minute later Pt started walking again on 2L, O2 sats went to 88%, bumped up to 3L and Pt went to 93%. Pt very SOB while ambulating

## 2020-08-15 NOTE — Progress Notes (Signed)
Physical Therapy Treatment Patient Details Name: [name] Molina MRN: [id number] DOB: December 11, 1945 Today's Date: 08/15/2020    History of Present Illness Pt is a 75 y.o. F admitted 3/27 with generalized weakness and cough. Chest x-ray revealing patchy bilateral lower lobe infiltrates concerning for early PNA. Additionally felt to be experiencing a concurrent COPD exacerbation. Significant PMH: COPD, diastolic CHF, paroxysmal atrial fibrillation, depression.    PT Comments    Session limited due to recent IV lasix and pt with frequent urination. Patient expressing concern re: continued LE edema and open to LE exercises (but only in sitting; did not want to stand and experience incontinence). See below for exercises. Educated on role of doing exercises frequently throughout each waking hour to help circulation and moving fluid out of her legs. Patient verbalized understanding that walking is also good for her legs, but again deferred walking due to her recent lasix.     Follow Up Recommendations  Home health PT;Supervision for mobility/OOB     Equipment Recommendations  None recommended by PT    Recommendations for Other Services       Precautions / Restrictions Precautions Precautions: Fall    Mobility  Bed Mobility               General bed mobility comments: up in recliner    Transfers                 General transfer comment: pt refused because she has been given lasix and incontinent if she stands up (using purewick)  Ambulation/Gait             General Gait Details: pt politely refusing due to lasix; also walked with nursing   Stairs             Wheelchair Mobility    Modified Rankin (Stroke Patients Only)       Balance Overall balance assessment: Needs assistance Sitting-balance support: Feet supported Sitting balance-Leahy Scale: Good                                      Cognition Arousal/Alertness:  Awake/alert Behavior During Therapy: WFL for tasks assessed/performed Overall Cognitive Status: Within Functional Limits for tasks assessed                                        Exercises General Exercises - Lower Extremity Ankle Circles/Pumps: AROM;Both;10 reps (instructed to use exercise to help reduce edema in legs) Quad Sets: AROM;Both;10 reps (instructed to use exercise to help reduce edema in legs) Long Arc Quad: AROM;Both;10 reps    General Comments General comments (skin integrity, edema, etc.): 2L at rest 96% HR 80; with seated exercises on RA 92% HR 102      Pertinent Vitals/Pain Pain Assessment: No/denies pain    Home Living                      Prior Function            PT Goals (current goals can now be found in the care plan section) Acute Rehab PT Goals Patient Stated Goal: feel better Time For Goal Achievement: 08/27/20 Potential to Achieve Goals: Good Progress towards PT goals: Not progressing toward goals - comment (pt limited by recent lasix)    Frequency  Min 3X/week      PT Plan Current plan remains appropriate    Co-evaluation              AM-PAC PT "6 Clicks" Mobility   Outcome Measure  Help needed turning from your back to your side while in a flat bed without using bedrails?: None Help needed moving from lying on your back to sitting on the side of a flat bed without using bedrails?: None Help needed moving to and from a bed to a chair (including a wheelchair)?: A Little Help needed standing up from a chair using your arms (e.g., wheelchair or bedside chair)?: A Little Help needed to walk in hospital room?: A Little Help needed climbing 3-5 steps with a railing? : A Lot 6 Click Score: 19    End of Session Equipment Utilized During Treatment: Oxygen Activity Tolerance: Patient tolerated treatment well;Treatment limited secondary to medical complications (Comment) (limited by recent lasix and pt not  wanting to be incontinent while moblizing) Patient left: in chair;with call bell/phone within reach   PT Visit Diagnosis: Unsteadiness on feet (R26.81);Muscle weakness (generalized) (M62.81);Difficulty in walking, not elsewhere classified (R26.2)     Time: 9295-7473 PT Time Calculation (min) (ACUTE ONLY): 15 min  Charges:  $Therapeutic Exercise: 8-22 mins                      Arby Barrette, PT Pager [phone]    Rexanne Mano 08/15/2020, 3:22 PM

## 2020-08-15 NOTE — Progress Notes (Addendum)
PHARMACIST - PHYSICIAN COMMUNICATION DR:   Elgergawy CONCERNING: Antibiotic IV to Oral Route Change Policy  RECOMMENDATION: This patient is receiving azithromycin by the intravenous route.  Based on criteria approved by the Pharmacy and Therapeutics Committee, the antibiotic(s) is/are being converted to the equivalent oral dose form(s).   DESCRIPTION: These criteria include:  Patient being treated for a respiratory tract infection, urinary tract infection, cellulitis or clostridium difficile associated diarrhea if on metronidazole  The patient is not neutropenic and does not exhibit a GI malabsorption state  The patient is eating (either orally or via tube) and/or has been taking other orally administered medications for a least 24 hours  The patient is improving clinically and has a Tmax < 100.5  If you have questions about this conversion, please contact the Pharmacy Department  []   [phone] )  [facility] []   [phone] )  [facility] [x]   [phone] )  [facility] []   [phone] )  [facility] []   [phone] )  [place] to add stop date of 5d to ceftriaxone and azithromycin. Also ok to add SSI due to elevating CBGs per Dr. Waldron Labs.  Onnie Boer, PharmD, BCIDP, AAHIVP, CPP Infectious Disease Pharmacist 08/15/2020 9:11 AM

## 2020-08-15 NOTE — Plan of Care (Signed)
  Problem: Education: Goal: Knowledge of General Education information will improve Description Including pain rating scale, medication(s)/side effects and non-pharmacologic comfort measures Outcome: Progressing   

## 2020-08-15 NOTE — Progress Notes (Signed)
PROGRESS NOTE                                                                             PROGRESS NOTE                                                                                                                                                                                                             Patient Demographics:    [name] Molina, is a 75 y.o. female, DOB - 1945/11/17, ION:[id number]  Outpatient Primary MD for the patient is Harlan Stains, MD    LOS - 1  Admit date - 08/13/2020    No chief complaint on file.      Brief Narrative     75 year old female with past medical history of COPD, diastolic congestive heart failure (Echo 08/2019 EF 65-70%), paroxysmal atrial fibrillation, macular degeneration, hyperlipidemia, gastroesophageal reflux disease, depression who presents to [facility] emergency department with complaints of generalized weakness and cough. -Her work-up was significant for COPD exacerbation, community-acquired pneumonia, as well volume overload, she is currently being treated with steroids, antibiotics and diuresis.    Subjective:    Brodi Nery today still reports some dyspnea, cough, she denies any fever or chills.      Assessment  & Plan :    Principal Problem:   Pneumonia of both lower lobes due to infectious organism Active Problems:   COPD with acute exacerbation (HCC)   GERD without esophagitis   Mixed hyperlipidemia   Sepsis due to pneumonia (HCC)   AF (paroxysmal atrial fibrillation) (HCC)   Major depressive disorder   Chronic diastolic CHF (congestive heart failure) (HCC)   Elevated troponin level not due myocardial infarction   COPD exacerbation ([place])  She is due to pneumonia of both lower lobes due to infectious organism - Patient presenting with greater than 1 week history of generalized malaise, weakness and cough productive with purulent sputum -Chest x-ray revealing patchy bilateral  lower lobe infiltrates concerning for early pneumonia - Patient additionally exhibiting multiple SIRS criteria including  tachycardia and leukocytosis with concurrent lactic acidosis all concerning for early sepsis, lactic acid has normalized -Reports significant cough started on as needed cough medicine -Continue with IV Rocephin and azithromycin. -Blood cultures with no growth to date, sputum culture growing normal respiratory flora.  COPD with acute exacerbation  - Patient additionally felt to be suffering from concurrent COPD exacerbation with evidence of prolonged expiratory phase and wheezing on admission examination -Remains dyspneic with significant wheezing, will continue current dose IV Solu-Medrol 40 mg every 8 hours, continue with scheduled duo nebs . -Will need home nebs when she is ready for discharge .  Acute Chronic diastolic CHF (congestive heart failure) ([place]) -Patient with lower extremity edema, no evidence of volume overload, so she will be started on Lasix 40 mg IV twice daily.  Acute hypoxic respiratory failure -Ambulated in the hallway today, dyspneic, requiring 3 L nasal cannula, this is due to above, she will need home oxygen on discharge.  Mixed hyperlipidemia - Continuing home regimen of lipid lowering therapy.  AF (paroxysmal atrial fibrillation) ([place]) -Patient is currently in sinus rhythm -Continue with apixaban and metoprolol  Major depressive disorder - Continue home  psychotropic regimen   Elevated troponin level not due myocardial infarction - Slight elevation in second troponin performed in the emergency department -She is chest pain-free, this is supply demand due to sepsis.  GERD without esophagitis - Continue home regimen of proton pump inhibitor  Hypomagnesemia -repleted   SpO2: 97 % O2 Flow Rate (L/min): 2 L/min FiO2 (%): 21 %  Recent Labs  Lab 08/13/20 0150 08/13/20 0213 08/13/20 0334 08/13/20 0702 08/13/20 1146  08/15/20 0050  WBC 11.7*  --   --  11.4*  --  11.1*  PLT 178  --   --  178  --  204  BNP 135.2*  --   --   --   --   --   DDIMER  --   --  <0.27  --   --   --   PROCALCITON  --   --   --  0.25  --   --   AST 24  --   --  20  --   --   ALT 17  --   --  14  --   --   ALKPHOS 65  --   --  56  --   --   BILITOT 1.0  --   --  0.9  --   --   ALBUMIN 3.7  --   --  3.1*  --   --   INR  --   --   --  1.6*  --   --   LATICACIDVEN 2.6*  --  2.0*  --  1.8  --   SARSCOV2NAA  --  NEGATIVE  --   --   --   --        ABG  No results found for: PHART, PCO2ART, PO2ART, HCO3, TCO2, ACIDBASEDEF, O2SAT      Condition - Extremely Guarded  Family Communication  :  D/W daughter by phone  Code Status :  DNR  Consults  : none  Disposition Plan  :    Status [id number]   The patient will require care spanning > 2 midnights and should be moved to inpatient because: IV treatments appropriate due to intensity of illness or inability to take PO  Dispo: The patient is from: Home  Anticipated d/c is to: Home              Patient currently is not medically stable to d/c.  Remains  with wheezing, dyspneic, requiring IV steroids and antibiotics and close monitoring.   Difficult to place patient No      DVT Prophylaxis  :  ELIQUIS  Lab Results  Component Value Date   PLT 204 08/15/2020    Diet :  Diet Order            Diet Heart Room service appropriate? Yes; Fluid consistency: Thin  Diet effective now                  Inpatient Medications  Scheduled Meds: . apixaban  5 mg Oral BID  . azithromycin  500 mg Oral Daily  . DULoxetine  60 mg Oral q morning  . fluticasone  1 spray Each Nare Daily  . fluticasone furoate-vilanterol  1 puff Inhalation Daily  . guaiFENesin  1,200 mg Oral BID  . insulin aspart  0-5 Units Subcutaneous QHS  . insulin aspart  0-9 Units Subcutaneous TID WC  . ipratropium-albuterol  3 mL Nebulization BID  . methylPREDNISolone (SOLU-MEDROL)  injection  40 mg Intravenous Q8H  . metoprolol succinate  50 mg Oral QHS  . montelukast  10 mg Oral QHS  . pantoprazole  40 mg Oral QHS  . pravastatin  40 mg Oral QHS  . vitamin B-12  100 mcg Oral Daily   Continuous Infusions: . cefTRIAXone (ROCEPHIN)  IV 2 g (08/14/20 2151)   PRN Meds:.acetaminophen, albuterol, guaiFENesin-dextromethorphan, loratadine, LORazepam, methocarbamol, pseudoephedrine  Antibiotics  :    Anti-infectives (From admission, onward)   Start     Dose/Rate Route Frequency Ordered Stop   08/15/20 1000  azithromycin (ZITHROMAX) tablet 500 mg        500 mg Oral Daily 08/15/20 0900 08/18/20 0959   08/13/20 0315  cefTRIAXone (ROCEPHIN) 2 g in sodium chloride 0.9 % 100 mL IVPB        2 g 200 mL/hr over 30 Minutes Intravenous Every 24 hours 08/13/20 0305 08/17/20 2159   08/13/20 0315  azithromycin (ZITHROMAX) 500 mg in sodium chloride 0.9 % 250 mL IVPB  Status:  Discontinued        500 mg 250 mL/hr over 60 Minutes Intravenous Every 24 hours 08/13/20 0305 08/15/20 0900       Emeline Gins Harlean Regula M.D on 08/15/2020 at 10:40 AM  To page go to [email or web address]   [facility] -  Office  [phone]      Objective:   Vitals:   08/14/20 1349 08/14/20 1942 08/14/20 2127 08/15/20 0819  BP: (!) 164/74  130/62   Pulse: (!) 105  (!) 118   Resp: 16  20   Temp: 98 F (36.7 C)  97.9 F (36.6 C)   TempSrc:   Oral   SpO2: 96% 98% 99% 97%  Weight:      Height:        Wt Readings from Last 3 Encounters:  08/13/20 100.9 kg  06/15/20 100.5 kg  06/14/20 100.5 kg     Intake/Output Summary (Last 24 hours) at 08/15/2020 1040 Last data filed at 08/15/2020 0819 Gross per 24 hour  Intake 590 ml  Output 1 ml  Net 589 ml     Physical Exam  Awake Alert, Oriented X 3, No new F.N deficits, Normal affect Symmetrical Chest wall movement, fair air entry bilaterally, remains with significant wheezing RRR,No Gallops,Rubs or  new Murmurs, No Parasternal Heave +ve B.Sounds,  Abd Soft, No tenderness, No rebound - guarding or rigidity. No Cyanosis, Clubbing ,+1 edema, No new Rash or bruise       Data Review:    CBC Recent Labs  Lab 08/13/20 0150 08/13/20 0702 08/15/20 0050  WBC 11.7* 11.4* 11.1*  HGB 13.6 12.0 11.4*  HCT 42.1 35.5* 35.7*  PLT 178 178 204  MCV 85.6 84.7 87.7  MCH 27.6 28.6 28.0  MCHC 32.3 33.8 31.9  RDW 15.3 15.5 15.4  LYMPHSABS 0.4* 0.9  --   MONOABS 0.7 0.9  --   EOSABS 0.0 0.0  --   BASOSABS 0.0 0.1  --     Recent Labs  Lab 08/13/20 0150 08/13/20 0334 08/13/20 0702 08/13/20 1146 08/15/20 0050  NA 140  --  142  --  142  K 3.1*  --  3.5  --  4.2  CL 107  --  110  --  107  CO2 22  --  24  --  25  GLUCOSE 143*  --  118*  --  250*  BUN 12  --  13  --  15  CREATININE 0.80  --  0.87  --  0.92  CALCIUM 8.6*  --  8.1*  --  8.8*  AST 24  --  20  --   --   ALT 17  --  14  --   --   ALKPHOS 65  --  56  --   --   BILITOT 1.0  --  0.9  --   --   ALBUMIN 3.7  --  3.1*  --   --   MG  --   --  1.6*  --   --   DDIMER  --  <0.27  --   --   --   PROCALCITON  --   --  0.25  --   --   LATICACIDVEN 2.6* 2.0*  --  1.8  --   INR  --   --  1.6*  --   --   BNP 135.2*  --   --   --   --     ------------------------------------------------------------------------------------------------------------------ No results for input(s): CHOL, HDL, LDLCALC, TRIG, CHOLHDL, LDLDIRECT in the last 72 hours.  No results found for: HGBA1C ------------------------------------------------------------------------------------------------------------------ No results for input(s): TSH, T4TOTAL, T3FREE, THYROIDAB in the last 72 hours.  Invalid input(s): FREET3  Cardiac Enzymes No results for input(s): CKMB, TROPONINI, MYOGLOBIN in the last 168 hours.  Invalid input(s): CK ------------------------------------------------------------------------------------------------------------------    Component Value Date/Time   BNP 135.2 (H) 08/13/2020 0150     Micro Results Recent Results (from the past 240 hour(s))  Culture, blood (routine x 2)     Status: None (Preliminary result)   Collection Time: 08/13/20  1:50 AM   Specimen: BLOOD  Result Value Ref Range Status   Specimen Description BLOOD SITE NOT SPECIFIED  Final   Special Requests   Final    BOTTLES DRAWN AEROBIC AND ANAEROBIC Blood Culture results may not be optimal due to an excessive volume of blood received in culture bottles   Culture   Final    NO GROWTH 2 DAYS Performed at [facility], [place] [address] [place], [place] [address]    Report Status PENDING  Incomplete  Resp Panel by RT-PCR (Flu A&B, Covid) Nasopharyngeal Swab     Status: None   Collection Time: 08/13/20  2:13 AM   Specimen: Nasopharyngeal  Swab; Nasopharyngeal(NP) swabs in vial transport medium  Result Value Ref Range Status   SARS Coronavirus 2 by RT PCR NEGATIVE NEGATIVE Final    Comment: (NOTE) SARS-CoV-2 target nucleic acids are NOT DETECTED.  The SARS-CoV-2 RNA is generally detectable in upper respiratory specimens during the acute phase of infection. The lowest concentration of SARS-CoV-2 viral copies this assay can detect is 138 copies/mL. A negative result does not preclude SARS-Cov-2 infection and should not be used as the sole basis for treatment or other patient management decisions. A negative result may occur with  improper specimen collection/handling, submission of specimen other than nasopharyngeal swab, presence of viral mutation(s) within the areas targeted by this assay, and inadequate number of viral copies(<138 copies/mL). A negative result must be combined with clinical observations, patient history, and epidemiological information. The expected result is Negative.  Fact Sheet for Patients:  EntrepreneurPulse.com.au  Fact Sheet for Healthcare Providers:  IncredibleEmployment.be  This test is no t yet approved or cleared by the [place] FDA and  has been authorized for detection and/or diagnosis of SARS-CoV-2 by FDA under an Emergency Use Authorization (EUA). This EUA will remain  in effect (meaning this test can be used) for the duration of the COVID-19 declaration under Section 564(b)(1) of the Act, 21 U.S.C.section 360bbb-3(b)(1), unless the authorization is terminated  or revoked sooner.       Influenza A by PCR NEGATIVE NEGATIVE Final   Influenza B by PCR NEGATIVE NEGATIVE Final    Comment: (NOTE) The Xpert Xpress SARS-CoV-2/FLU/RSV plus assay is intended as an aid in the diagnosis of influenza from Nasopharyngeal swab specimens and should not be used as a sole basis for treatment. Nasal washings and aspirates are unacceptable for Xpert Xpress SARS-CoV-2/FLU/RSV testing.  Fact Sheet for Patients: EntrepreneurPulse.com.au  Fact Sheet for Healthcare Providers: IncredibleEmployment.be  This test is not yet approved or cleared by the [place] FDA and has been authorized for detection and/or diagnosis of SARS-CoV-2 by FDA under an Emergency Use Authorization (EUA). This EUA will remain in effect (meaning this test can be used) for the duration of the COVID-19 declaration under Section 564(b)(1) of the Act, 21 U.S.C. section 360bbb-3(b)(1), unless the authorization is terminated or revoked.  Performed at [facility], [place] [address], [place], [place] [address]   Culture, blood (routine x 2)     Status: None (Preliminary result)   Collection Time: 08/13/20  3:40 AM   Specimen: BLOOD RIGHT HAND  Result Value Ref Range Status   Specimen Description BLOOD RIGHT HAND  Final   Special Requests   Final    BOTTLES DRAWN AEROBIC AND ANAEROBIC Blood Culture adequate volume   Culture   Final    NO GROWTH 2 DAYS Performed at [facility], [place] [address], [place], [place] [address]    Report Status PENDING  Incomplete  Expectorated Sputum Assessment w Gram  Stain, Rflx to Resp Cult     Status: None   Collection Time: 08/13/20  8:12 AM   Specimen: Expectorated Sputum  Result Value Ref Range Status   Specimen Description EXPECTORATED SPUTUM  Final   Special Requests NONE  Final   Sputum evaluation   Final    THIS SPECIMEN IS ACCEPTABLE FOR SPUTUM CULTURE Performed at [facility], [place] [address], [place], [place] [address]    Report Status 08/13/2020 FINAL  Final  Culture, Respiratory w Gram Stain     Status: None   Collection  Time: 08/13/20  8:12 AM  Result Value Ref Range Status   Specimen Description EXPECTORATED SPUTUM  Final   Special Requests NONE Reflexed from [id number]  Final   Gram Stain   Final    ABUNDANT WBC PRESENT, PREDOMINANTLY PMN RARE SQUAMOUS EPITHELIAL CELLS PRESENT RARE GRAM POSITIVE RODS RARE GRAM POSITIVE COCCI IN PAIRS RARE GRAM NEGATIVE RODS    Culture   Final    RARE Normal respiratory flora-no Staph aureus or Pseudomonas seen Performed at [place] [address] [place], [place] [address]    Report Status 08/15/2020 FINAL  Final    Radiology Reports DG Chest Portable 1 View  Result Date: 08/13/2020 CLINICAL DATA:  One week of cough and congestion with shortness of breath on exertion EXAM: PORTABLE CHEST 1 VIEW COMPARISON:  Chest radiograph March 01, 2020 and chest CT December 16, 2018. FINDINGS: The heart size and mediastinal contours are within normal limits. Aortic arch calcifications. Right lower lobe scarring. Bibasilar atelectasis. No focal consolidation or overt pulmonary edema. No significant pleural effusion or visualized pneumothorax. Anterior cervical fixation hardware. Degenerative changes bilateral glenohumeral and acromioclavicular joints. IMPRESSION: Bibasilar atelectasis and right lower lobe scarring. No definite acute findings. Electronically Signed   By: Dahlia Bailiff MD   On: 08/13/2020 01:49

## 2020-08-16 LAB — MAGNESIUM: Magnesium: 2.3 mg/dL (ref 1.7–2.4)

## 2020-08-16 LAB — CBC WITH DIFFERENTIAL/PLATELET
Abs Immature Granulocytes: 0.16 10*3/uL — ABNORMAL HIGH (ref 0.00–0.07)
Basophils Absolute: 0 10*3/uL (ref 0.0–0.1)
Basophils Relative: 0 %
Eosinophils Absolute: 0 10*3/uL (ref 0.0–0.5)
Eosinophils Relative: 0 %
HCT: 36.2 % (ref 36.0–46.0)
Hemoglobin: 11.9 g/dL — ABNORMAL LOW (ref 12.0–15.0)
Immature Granulocytes: 2 %
Lymphocytes Relative: 7 %
Lymphs Abs: 0.6 10*3/uL — ABNORMAL LOW (ref 0.7–4.0)
MCH: 28.1 pg (ref 26.0–34.0)
MCHC: 32.9 g/dL (ref 30.0–36.0)
MCV: 85.6 fL (ref 80.0–100.0)
Monocytes Absolute: 0.5 10*3/uL (ref 0.1–1.0)
Monocytes Relative: 6 %
Neutro Abs: 7.2 10*3/uL (ref 1.7–7.7)
Neutrophils Relative %: 85 %
Platelets: 223 10*3/uL (ref 150–400)
RBC: 4.23 MIL/uL (ref 3.87–5.11)
RDW: 15.4 % (ref 11.5–15.5)
WBC: 8.4 10*3/uL (ref 4.0–10.5)
nRBC: 0 % (ref 0.0–0.2)

## 2020-08-16 LAB — BASIC METABOLIC PANEL
Anion gap: 9 (ref 5–15)
BUN: 21 mg/dL (ref 8–23)
CO2: 27 mmol/L (ref 22–32)
Calcium: 9.1 mg/dL (ref 8.9–10.3)
Chloride: 105 mmol/L (ref 98–111)
Creatinine, Ser: 0.75 mg/dL (ref 0.44–1.00)
GFR, Estimated: >60 mL/min (ref >60)
Glucose, Bld: 187 mg/dL — ABNORMAL HIGH (ref 70–99)
Potassium: 4.4 mmol/L (ref 3.5–5.1)
Sodium: 141 mmol/L (ref 135–145)

## 2020-08-16 LAB — GLUCOSE, CAPILLARY
Glucose-Capillary: 204 mg/dL — ABNORMAL HIGH (ref 70–99)
Glucose-Capillary: 258 mg/dL — ABNORMAL HIGH (ref 70–99)

## 2020-08-16 LAB — PROCALCITONIN: Procalcitonin: <0.1 ng/mL

## 2020-08-16 MED ORDER — DM-GUAIFENESIN ER 30-600 MG PO TB12: 1.0000 | ORAL_TABLET | Freq: Two times a day (BID) | ORAL | 0 refills | Status: AC | PRN

## 2020-08-16 MED ORDER — PREDNISONE 10 MG PO TABS: ORAL_TABLET | ORAL | 0 refills | Status: AC

## 2020-08-16 MED ORDER — CEFDINIR 300 MG PO CAPS: 300.0000 mg | ORAL_CAPSULE | Freq: Two times a day (BID) | ORAL | 0 refills | Status: AC

## 2020-08-16 MED ORDER — AZITHROMYCIN 500 MG PO TABS: 500.0000 mg | ORAL_TABLET | Freq: Every day | ORAL | 0 refills | Status: DC

## 2020-08-16 MED ORDER — IPRATROPIUM-ALBUTEROL 0.5-2.5 (3) MG/3ML IN SOLN: 3.0000 mL | Freq: Two times a day (BID) | RESPIRATORY_TRACT | 0 refills | Status: DC

## 2020-08-16 MED ORDER — AZITHROMYCIN 500 MG PO TABS: 500.0000 mg | ORAL_TABLET | Freq: Every day | ORAL | 0 refills | Status: AC

## 2020-08-16 NOTE — Progress Notes (Signed)
Physical Therapy Treatment Patient Details Name: [name] Molina MRN: [id number] DOB: 08-18-1945 Today's Date: 08/16/2020    History of Present Illness Pt is a 75 y.o. F admitted 3/27 with generalized weakness and cough. Chest x-ray revealing patchy bilateral lower lobe infiltrates concerning for early PNA. Additionally felt to be experiencing a concurrent COPD exacerbation. Significant PMH: COPD, diastolic CHF, paroxysmal atrial fibrillation, depression.    PT Comments    Pt eager to mobilize with PT this day, as pt wants to d/c home. Pt ambulatory in hallway with use of rollator and close guard for safety, PT encouraging proper breathing technique throughout mobility and sitting/standing rest breaks as needed. SPO2 reading 80-86% on 2LO2 with poor pleth immediately post-ambulation - quickly recovered within seconds to 90% with breathing technique and good waveform. Pt continues to be limited by decreased activity tolerance, dyspnea on exertion, generalized weakness, and impaired standing balance. PT educated pt on the importance of energy conservation and strategies once d/c home, including intermittent rest breaks as needed, activity pacing. PT continuing to recommend HHPT And increased assist from daughter at d/c, will continue to follow acutely.     Follow Up Recommendations  Home health PT;Supervision for mobility/OOB     Equipment Recommendations  None recommended by PT    Recommendations for Other Services       Precautions / Restrictions Precautions Precautions: Fall Precaution Comments: watch sats Restrictions Weight Bearing Restrictions: No    Mobility  Bed Mobility Overal bed mobility: Needs Assistance             General bed mobility comments: up in recliner    Transfers Overall transfer level: Needs assistance Equipment used: 4-wheeled walker Transfers: Sit to/from Stand Sit to Stand: Min guard         General transfer comment: for safety, verbal cuing  for hand placement when rising/sitting. STS x2, from recliner and rollator.  Ambulation/Gait Ambulation/Gait assistance: Min guard Gait Distance (Feet): 85 Feet (x2, seated rest break x1 minute) Assistive device: 4-wheeled walker Gait Pattern/deviations: Step-through pattern;Decreased stride length;Wide base of support;Trunk flexed Gait velocity: decr   General Gait Details: min guard for safety, verbal cuing for breathing technique (in through nose, out through mouth) multiple times during session, pacing activity. Seated rest break at halfway point, SPO2 85% on RA with DOE 2/4, 2LO2 placed with SPO2 90-96%. HR 100-138 bpm during mobility   Stairs             Wheelchair Mobility    Modified Rankin (Stroke Patients Only)       Balance Overall balance assessment: Needs assistance;History of Falls Sitting-balance support: Feet supported Sitting balance-Leahy Scale: Good       Standing balance-Leahy Scale: Fair Standing balance comment: able to stand statically without UE support, reliant on external assist dynamically                            Cognition Arousal/Alertness: Awake/alert Behavior During Therapy: WFL for tasks assessed/performed Overall Cognitive Status: Within Functional Limits for tasks assessed                                 General Comments: Pt requires intermittent cues for breathing technique, states "I'm just a mouth breather"      Exercises      General Comments General comments (skin integrity, edema, etc.): 2LO2 to maintain SpO2 >90%; pt with  DOE 2/4, HR 138 bpm, and SPO2 reading 80-86% with poor pleth immediately post-ambulation - quickly recovered within seconds to 90% with breathing technique and good waveform.      Pertinent Vitals/Pain Pain Assessment: No/denies pain    Home Living                      Prior Function            PT Goals (current goals can now be found in the care plan section)  Acute Rehab PT Goals Patient Stated Goal: feel better Time For Goal Achievement: 08/27/20 Potential to Achieve Goals: Good Progress towards PT goals: Progressing toward goals    Frequency    Min 3X/week      PT Plan Current plan remains appropriate    Co-evaluation              AM-PAC PT "6 Clicks" Mobility   Outcome Measure  Help needed turning from your back to your side while in a flat bed without using bedrails?: None Help needed moving from lying on your back to sitting on the side of a flat bed without using bedrails?: None Help needed moving to and from a bed to a chair (including a wheelchair)?: A Little Help needed standing up from a chair using your arms (e.g., wheelchair or bedside chair)?: A Little Help needed to walk in hospital room?: A Little Help needed climbing 3-5 steps with a railing? : A Little 6 Click Score: 20    End of Session Equipment Utilized During Treatment: Oxygen;Gait belt Activity Tolerance: Patient tolerated treatment well Patient left: in chair;with call bell/phone within reach;Other (comment) (pt agreeable to press call button and wait for assist prior to mobilizing back to bed) Nurse Communication: Mobility status;Other (comment) (SpO2) PT Visit Diagnosis: Unsteadiness on feet (R26.81);Muscle weakness (generalized) (M62.81);Difficulty in walking, not elsewhere classified (R26.2)     Time: 9323-5573 PT Time Calculation (min) (ACUTE ONLY): 26 min  Charges:  $Gait Training: 8-22 mins $Self Care/Home Management: 8-22                    Stacie Glaze, PT Acute Rehabilitation Services Pager [phone]  Office [phone]    Louis Matte 08/16/2020, 10:44 AM

## 2020-08-16 NOTE — Discharge Summary (Signed)
Discharge Summary  [name] TERRITO JWJ:[id number] DOB: [birth year]  PCP: Harlan Stains, MD  Admit date: 08/13/2020 Discharge date: 08/16/2020  Time spent: 40 mins  Recommendations for Outpatient Follow-up:  1. PCP in 1 week 2. Pulmonology as scheduled  Discharge Diagnoses:  Active Hospital Problems   Diagnosis Date Noted  . Pneumonia of both lower lobes due to infectious organism 08/13/2020  . COPD exacerbation ([place]) 08/14/2020  . Sepsis due to pneumonia ([place]) 08/13/2020  . AF (paroxysmal atrial fibrillation) ([place]) 08/13/2020  . Major depressive disorder 08/13/2020  . Chronic diastolic CHF (congestive heart failure) ([place]) 08/13/2020  . Elevated troponin level not due myocardial infarction 08/13/2020  . GERD without esophagitis   . Mixed hyperlipidemia   . COPD with acute exacerbation ([place]) 04/25/2007    Resolved Hospital Problems  No resolved problems to display.    Discharge Condition: Stable  Diet recommendation: Heart healthy  Vitals:   08/16/20 0752 08/16/20 0800  BP:    Pulse:    Resp:  20  Temp:    SpO2: 99% 95%    History of present illness:  75 year old female with past medical history of COPD, diastolic congestive heart failure([place] 08/2019 EF 65-70%),paroxysmal atrial fibrillation, macular degeneration, hyperlipidemia, gastroesophageal reflux disease, depression who presents to [facility] emergency department with complaints of generalized weakness and cough. -Her work-up was significant for COPD exacerbation, community-acquired pneumonia, as well as volume overload, she has been treated with steroids, antibiotics and diuresis.    Today, patient reports overall improvements, denies any worsening SOB, still with some cough, denies any chest pain, abdominal pain, nausea/vomiting, fever/chills.  Patient will be discharged, with home health PT/OT follow-up.   Hospital Course:  Principal Problem:   Pneumonia of both lower lobes due to infectious  organism Active Problems:   COPD with acute exacerbation ([place])   GERD without esophagitis   Mixed hyperlipidemia   Sepsis due to pneumonia (HCC)   AF (paroxysmal atrial fibrillation) (HCC)   Major depressive disorder   Chronic diastolic CHF (congestive heart failure) (HCC)   Elevated troponin level not due myocardial infarction   COPD exacerbation (HCC)  Acute hypoxic respiratory failure Sepsis likely 2/2 Pneumonia of both lower lobes Still requiring about 2 L of O2 Chest x-ray revealing patchy bilateral lower lobe infiltrates concerning for early pneumonia On admission, tachycardia with leukocytosis. Noted lactic acidosis BC X 2 NGTD, sputum culture with normal flora S/P IV Rocephin and azithromycin--> switched to azithromycin PO and cefdinir to complete 5 days Plan to discharge on home O2 Follow up with PCP  COPD with acute exacerbation  S/P IV Solu-Medrol--> switch to tapered dose of prednisone D/C on duonebs  Acute Chronic diastolic CHF Patient with lower extremity edema, improved S/p IV lasix, continue home PO lasix  Elevated troponin level not due myocardial infarction Slight elevation in second troponin, flat trend Chest pain-free, this is supply demand due to sepsis.  Mixed hyperlipidemia Continue home regimen  AF (paroxysmal atrial fibrillation)  Patient is currently in sinus rhythm Continue with apixaban and metoprolol  GERD without esophagitis Continue home regimen of proton pump inhibitor  Hypomagnesemia Repleted   Major depressive disorder Continue home psychotropic regimen  Morbid obesity Lifestyle modification advised      Malnutrition Type:  Nutrition Problem: Inadequate oral intake Etiology: decreased appetite   Malnutrition Characteristics:  Signs/Symptoms: per patient/family report   Nutrition Interventions:  Interventions: Ensure Enlive (each supplement provides 350kcal and 20 grams of protein)   Estimated body mass  index is 40.69 kg/m as calculated from the following:   Height as of this encounter: 5\' 2"  (1.575 m).   Weight as of this encounter: 100.9 kg.    Procedures:  None  Consultations:  None  Discharge Exam: BP 117/64 (BP Location: Right Arm)   Pulse 79   Temp 98.5 F (36.9 C) (Oral)   Resp 20   Ht 5\' 2"  (1.575 m)   Wt 100.9 kg   SpO2 95%   BMI 40.69 kg/m     General: NAD Cardiovascular: S1, S2 present Respiratory: Diminished breath sounds bilaterally    Discharge Instructions You were cared for by a hospitalist during your hospital stay. If you have any questions about your discharge medications or the care you received while you were in the hospital after you are discharged, you can call the unit and asked to speak with the hospitalist on call if the hospitalist that took care of you is not available. Once you are discharged, your primary care physician will handle any further medical issues. Please note that NO REFILLS for any discharge medications will be authorized once you are discharged, as it is imperative that you return to your primary care physician (or establish a relationship with a primary care physician if you do not have one) for your aftercare needs so that they can reassess your need for medications and monitor your lab values.  Discharge Instructions    Diet - low sodium heart healthy   Complete by: As directed    Increase activity slowly   Complete by: As directed      Allergies as of 08/16/2020      Reactions   Food Shortness Of Breath   ALLERGY= MUSHROOMS   Tequin Shortness Of Breath   Codeine Nausea And Vomiting   Erythromycin Other (See Comments)   GI UPSET   Levaquin [levofloxacin In D5w] Other (See Comments)   "made me want to die."   Oxycodone-acetaminophen Itching   Wellbutrin [bupropion Hcl] Other (See Comments)   SHAKING   Penicillins Rash   Reaction: Childhood   Prednisone Rash      Medication List    STOP taking these  medications   methocarbamol 500 MG tablet Commonly known as: Robaxin   Tylenol Cold/Flu/Cough Night 5-6.25-10-325 MG/15ML Liqd Generic drug: Phenyleph-Doxylamine-DM-APAP     TAKE these medications   acetaminophen 650 MG CR tablet Commonly known as: TYLENOL Take 1,300 mg by mouth every 8 (eight) hours as needed for pain.   albuterol 108 (90 Base) MCG/ACT inhaler Commonly known as: VENTOLIN HFA Inhale 2 puffs into the lungs every 6 (six) hours as needed for wheezing or shortness of breath.   azithromycin 500 MG tablet Commonly known as: Zithromax Take 1 tablet (500 mg total) by mouth daily for 1 day. Start taking on: August 17, 2020   Besivance 0.6 % Susp Generic drug: Besifloxacin HCl Place 1 drop into the left eye See admin instructions. Instill 1 drop qid day of eye injection and qid the day after.   cefdinir 300 MG capsule Commonly known as: OMNICEF Take 1 capsule (300 mg total) by mouth 2 (two) times daily for 1 day. Start taking on: August 17, 2020   dextromethorphan-guaiFENesin 30-600 MG 12hr tablet Commonly known as: MUCINEX DM Take 1 tablet by mouth 2 (two) times daily as needed for up to 7 days for cough.   DULoxetine 60 MG capsule Commonly known as: CYMBALTA Take 60 mg by mouth every morning.   Eliquis  5 MG Tabs tablet Generic drug: apixaban Take 5 mg by mouth 2 (two) times daily.   fluticasone 50 MCG/ACT nasal spray Commonly known as: FLONASE Place 1 spray into both nostrils daily. What changed:   when to take this  reasons to take this   furosemide 40 MG tablet Commonly known as: LASIX Take 1 tablet (40 mg total) by mouth daily.   ipratropium-albuterol 0.5-2.5 (3) MG/3ML Soln Commonly known as: DUONEB Take 3 mLs by nebulization 2 (two) times daily.   loratadine-pseudoephedrine 5-120 MG tablet Commonly known as: CLARITIN-D 12-hour Take 1 tablet by mouth daily.   LORazepam 0.5 MG tablet Commonly known as: ATIVAN Take 0.5-1 mg by mouth daily as  needed for anxiety. Anxiety   metoprolol succinate 50 MG 24 hr tablet Commonly known as: TOPROL-XL Take 1 tablet (50 mg total) by mouth daily. Take with or immediately following a meal. What changed: when to take this   montelukast 10 MG tablet Commonly known as: SINGULAIR Take 10 mg by mouth at bedtime.   Ocuvite Eye + Multi Tabs Take 1 tablet by mouth in the morning and at bedtime.   omeprazole 20 MG capsule Commonly known as: PRILOSEC Take 20 mg by mouth at bedtime.   ondansetron 4 MG tablet Commonly known as: Zofran Take 1 tablet (4 mg total) by mouth every 8 (eight) hours as needed for nausea or vomiting.   pravastatin 40 MG tablet Commonly known as: PRAVACHOL Take 40 mg by mouth at bedtime.   predniSONE 10 MG tablet Commonly known as: DELTASONE Take 5 tablets (50 mg total) by mouth daily with breakfast for 3 days, THEN 4 tablets (40 mg total) daily with breakfast for 3 days, THEN 3 tablets (30 mg total) daily with breakfast for 3 days, THEN 2 tablets (20 mg total) daily with breakfast for 3 days, THEN 1 tablet (10 mg total) daily with breakfast for 3 days. Start taking on: August 17, 2020   Trelegy Ellipta 100-62.5-25 MCG/INH Aepb Generic drug: Fluticasone-Umeclidin-Vilant INHALE 1 PUFF BY MOUTH EVERY DAY What changed: See the new instructions.   VITAMIN B-12 PO Take 2,500 mcg by mouth daily.            Durable Medical Equipment  (From admission, onward)         Start     Ordered   08/16/20 1113  For home use only DME oxygen  Once       Question Answer Comment  Length of Need Lifetime   Mode or (Route) Nasal cannula   Liters per Minute 2   Frequency Continuous (stationary and portable oxygen unit needed)   Oxygen delivery system Gas      08/16/20 1114   08/15/20 0940  For home use only DME Nebulizer machine  Once       Question Answer Comment  Patient needs a nebulizer to treat with the following condition COPD exacerbation ([place])   Length of Need 6  Months      08/15/20 0940         Allergies  Allergen Reactions  . Food Shortness Of Breath    ALLERGY= MUSHROOMS  . Tequin Shortness Of Breath  . Codeine Nausea And Vomiting  . Erythromycin Other (See Comments)    GI UPSET  . Levaquin [Levofloxacin In D5w] Other (See Comments)    "made me want to die."  . Oxycodone-Acetaminophen Itching  . Wellbutrin [Bupropion Hcl] Other (See Comments)    SHAKING  . Penicillins Rash  Reaction: Childhood  . Prednisone Rash    Follow-up Information    [facility] Follow up.   Specialty: Home Health Services Why: For home health services. They will call you in 1-2 days to set up your first home appointment Contact information: [place] [address] [phone]        Harlan Stains, MD. Schedule an appointment as soon as possible for a visit in 1 week(s).   Specialty: Family Medicine Contact information: [address], [address] [place] [address] [phone]        Jerline Pain, MD .   Specialty: Cardiology Contact information: [phone] [address] [place] [address] [phone]                The results of significant diagnostics from this hospitalization (including imaging, microbiology, ancillary and laboratory) are listed below for reference.    Significant Diagnostic Studies: DG Chest Portable 1 View  Result Date: 08/13/2020 CLINICAL DATA:  One week of cough and congestion with shortness of breath on exertion EXAM: PORTABLE CHEST 1 VIEW COMPARISON:  Chest radiograph March 01, 2020 and chest CT December 16, 2018. FINDINGS: The heart size and mediastinal contours are within normal limits. Aortic arch calcifications. Right lower lobe scarring. Bibasilar atelectasis. No focal consolidation or overt pulmonary edema. No significant pleural effusion or visualized pneumothorax. Anterior cervical fixation hardware. Degenerative changes bilateral glenohumeral and  acromioclavicular joints. IMPRESSION: Bibasilar atelectasis and right lower lobe scarring. No definite acute findings. Electronically Signed   By: Dahlia Bailiff MD   On: 08/13/2020 01:49    Microbiology: Recent Results (from the past 240 hour(s))  Culture, blood (routine x 2)     Status: None (Preliminary result)   Collection Time: 08/13/20  1:50 AM   Specimen: BLOOD  Result Value Ref Range Status   Specimen Description BLOOD SITE NOT SPECIFIED  Final   Special Requests   Final    BOTTLES DRAWN AEROBIC AND ANAEROBIC Blood Culture results may not be optimal due to an excessive volume of blood received in culture bottles   Culture   Final    NO GROWTH 3 DAYS Performed at [facility], [place] [address], [place], [place] [address]    Report Status PENDING  Incomplete  Resp Panel by RT-PCR (Flu A&B, Covid) Nasopharyngeal Swab     Status: None   Collection Time: 08/13/20  2:13 AM   Specimen: Nasopharyngeal Swab; Nasopharyngeal(NP) swabs in vial transport medium  Result Value Ref Range Status   SARS Coronavirus 2 by RT PCR NEGATIVE NEGATIVE Final    Comment: (NOTE) SARS-CoV-2 target nucleic acids are NOT DETECTED.  The SARS-CoV-2 RNA is generally detectable in upper respiratory specimens during the acute phase of infection. The lowest concentration of SARS-CoV-2 viral copies this assay can detect is 138 copies/mL. A negative result does not preclude SARS-Cov-2 infection and should not be used as the sole basis for treatment or other patient management decisions. A negative result may occur with  improper specimen collection/handling, submission of specimen other than nasopharyngeal swab, presence of viral mutation(s) within the areas targeted by this assay, and inadequate number of viral copies(<138 copies/mL). A negative result must be combined with clinical observations, patient history, and epidemiological information. The expected result is Negative.  Fact Sheet for Patients:   EntrepreneurPulse.com.au  Fact Sheet for Healthcare Providers:  IncredibleEmployment.be  This test is no t yet approved or cleared by the [place] FDA  and  has been authorized for detection and/or diagnosis of SARS-CoV-2 by FDA under an Emergency Use Authorization (EUA). This EUA will remain  in effect (meaning this test can be used) for the duration of the COVID-19 declaration under Section 564(b)(1) of the Act, 21 U.S.C.section 360bbb-3(b)(1), unless the authorization is terminated  or revoked sooner.       Influenza A by PCR NEGATIVE NEGATIVE Final   Influenza B by PCR NEGATIVE NEGATIVE Final    Comment: (NOTE) The Xpert Xpress SARS-CoV-2/FLU/RSV plus assay is intended as an aid in the diagnosis of influenza from Nasopharyngeal swab specimens and should not be used as a sole basis for treatment. Nasal washings and aspirates are unacceptable for Xpert Xpress SARS-CoV-2/FLU/RSV testing.  Fact Sheet for Patients: EntrepreneurPulse.com.au  Fact Sheet for Healthcare Providers: IncredibleEmployment.be  This test is not yet approved or cleared by the [place] FDA and has been authorized for detection and/or diagnosis of SARS-CoV-2 by FDA under an Emergency Use Authorization (EUA). This EUA will remain in effect (meaning this test can be used) for the duration of the COVID-19 declaration under Section 564(b)(1) of the Act, 21 U.S.C. section 360bbb-3(b)(1), unless the authorization is terminated or revoked.  Performed at [facility], [place] [address] [place], [place] [address]   Culture, blood (routine x 2)     Status: None (Preliminary result)   Collection Time: 08/13/20  3:40 AM   Specimen: BLOOD RIGHT HAND  Result Value Ref Range Status   Specimen Description BLOOD RIGHT HAND  Final   Special Requests   Final    BOTTLES DRAWN AEROBIC AND ANAEROBIC Blood Culture adequate volume    Culture   Final    NO GROWTH 3 DAYS Performed at [facility], [place] [address], [place], [place] [address]    Report Status PENDING  Incomplete  Expectorated Sputum Assessment w Gram Stain, Rflx to Resp Cult     Status: None   Collection Time: 08/13/20  8:12 AM   Specimen: Expectorated Sputum  Result Value Ref Range Status   Specimen Description EXPECTORATED SPUTUM  Final   Special Requests NONE  Final   Sputum evaluation   Final    THIS SPECIMEN IS ACCEPTABLE FOR SPUTUM CULTURE Performed at [facility], [place] [address] [place], [place] [address]    Report Status 08/13/2020 FINAL  Final  Culture, Respiratory w Gram Stain     Status: None   Collection Time: 08/13/20  8:12 AM  Result Value Ref Range Status   Specimen Description EXPECTORATED SPUTUM  Final   Special Requests NONE Reflexed from [id number]  Final   Gram Stain   Final    ABUNDANT WBC PRESENT, PREDOMINANTLY PMN RARE SQUAMOUS EPITHELIAL CELLS PRESENT RARE GRAM POSITIVE RODS RARE GRAM POSITIVE COCCI IN PAIRS RARE GRAM NEGATIVE RODS    Culture   Final    RARE Normal respiratory flora-no Staph aureus or Pseudomonas seen Performed at [place] [facility], [place] [address], [place], [place] [address]    Report Status 08/15/2020 FINAL  Final     Labs: Basic Metabolic Panel: Recent Labs  Lab 08/13/20 0150 08/13/20 0702 08/15/20 0050 08/16/20 0736  NA 140 142 142 141  K 3.1* 3.5 4.2 4.4  CL 107 110 107 105  CO2 22 24 25 27   GLUCOSE 143* 118* 250* 187*  BUN 12 13 15 21   CREATININE 0.80 0.87 0.92 0.75  CALCIUM 8.6* 8.1* 8.8* 9.1  MG  --  1.6*  --  2.3   Liver Function Tests: Recent Labs  Lab 08/13/20 0150 08/13/20 0702  AST 24 20  ALT 17 14  ALKPHOS 65 56  BILITOT 1.0 0.9  PROT 6.3* 5.4*  ALBUMIN 3.7 3.1*   No results for input(s): LIPASE, AMYLASE in the last 168 hours. No results for input(s): AMMONIA in the last 168 hours. CBC: Recent Labs  Lab 08/13/20 0150 08/13/20 0702 08/15/20 0050  08/16/20 0736  WBC 11.7* 11.4* 11.1* 8.4  NEUTROABS 10.4* 9.4*  --  7.2  HGB 13.6 12.0 11.4* 11.9*  HCT 42.1 35.5* 35.7* 36.2  MCV 85.6 84.7 87.7 85.6  PLT 178 178 204 223   Cardiac Enzymes: No results for input(s): CKTOTAL, CKMB, CKMBINDEX, TROPONINI in the last 168 hours. BNP: BNP (last 3 results) Recent Labs    09/13/19 0427 03/01/20 2205 08/13/20 0150  BNP 335.9* 238.2* 135.2*    ProBNP (last 3 results) No results for input(s): PROBNP in the last 8760 hours.  CBG: Recent Labs  Lab 08/15/20 0746 08/15/20 1128 08/15/20 2038 08/16/20 0756 08/16/20 1218  GLUCAP 185* 196* 290* 204* 258*       Signed:  Alma Friendly, MD [facility] 08/16/2020, 12:56 PM

## 2020-08-16 NOTE — Discharge Instructions (Signed)

## 2020-08-16 NOTE — TOC Transition Note (Signed)
Transition of Care [facility]) - CM/SW Discharge Note   Patient Details  Name: [name] Molina MRN: [id number] Date of Birth: 03/28/1946  Transition of Care [facility]) CM/SW Contact:  Joanne Chars, LCSW Phone Number: 08/16/2020, 3:22 PM   Clinical Narrative:  Pt discharging home with [facility].  Home O2, nebulizer provided by [employer].  No other needs identified.      Final next level of care: [place] Barriers to Discharge: Barriers Resolved   Patient Goals and CMS Choice Patient states their goals for this hospitalization and ongoing recovery are:: to go home CMS Medicare.gov Compare Post Acute Care list provided to:: Patient Choice offered to / list presented to : Patient  Discharge Placement                       Discharge Plan and Services   Discharge Planning Services: CM Consult Post Acute Care Choice: Home Health          DME Arranged: Nebulizer/meds,Oxygen DME Agency: [facility] Date DME Agency Contacted: 08/15/20 Time DME Agency Contacted: 52 Representative spoke with at DME Agency: [place]: PT,OT [place] Date [place]: 08/13/20 Time [place]: 4370 Representative spoke with at [place]: Amy  Social Determinants of Health ([place]) Interventions     Readmission Risk Interventions No flowsheet data found.

## 2020-08-16 NOTE — Progress Notes (Signed)
Inpatient Diabetes Program Recommendations  AACE/ADA: New Consensus Statement on Inpatient Glycemic Control (2015)  Target Ranges:  Prepandial:   less than 140 mg/dL      Peak postprandial:   less than 180 mg/dL (1-2 hours)      Critically ill patients:  140 - 180 mg/dL   Lab Results  Component Value Date   GLUCAP 258 (H) 08/16/2020   HGBA1C 5.7 (H) 08/15/2020    Review of Glycemic Control Results for OMNIA, DOLLINGER (MRN [id number]) as of 08/16/2020 13:00  Ref. Range 08/15/2020 07:46 08/15/2020 11:28 08/15/2020 20:38 08/16/2020 07:56 08/16/2020 12:18  Glucose-Capillary Latest Ref Range: 70 - 99 mg/dL 185 (H) 196 (H) 290 (H) 204 (H) 258 (H)   Diabetes history:  DM2  Current orders for Inpatient glycemic control:  Novolog 0-9 units TID  Novolog 0-5 units QHS Solumedrol 40 mg Q8H  Inpatient Diabetes Program Recommendations:     In the setting of steroids please consider,  Novolog 3 units TID with meals  Will continue to follow while inpatient.  Thank you, Reche Dixon, RN, BSN Diabetes Coordinator Inpatient Diabetes Program [phone] (team pager from 8a-5p)

## 2020-08-16 NOTE — Plan of Care (Signed)
  Problem: Education: Goal: Knowledge of General Education information will improve Description Including pain rating scale, medication(s)/side effects and non-pharmacologic comfort measures Outcome: Progressing   

## 2020-08-17 DIAGNOSIS — Z6841 Body Mass Index (BMI) 40.0 and over, adult: Secondary | ICD-10-CM | POA: Diagnosis not present

## 2020-08-17 DIAGNOSIS — M6281 Muscle weakness (generalized): Secondary | ICD-10-CM | POA: Diagnosis not present

## 2020-08-17 DIAGNOSIS — Z7901 Long term (current) use of anticoagulants: Secondary | ICD-10-CM | POA: Diagnosis not present

## 2020-08-17 DIAGNOSIS — F329 Major depressive disorder, single episode, unspecified: Secondary | ICD-10-CM | POA: Diagnosis not present

## 2020-08-17 DIAGNOSIS — E782 Mixed hyperlipidemia: Secondary | ICD-10-CM | POA: Diagnosis not present

## 2020-08-17 DIAGNOSIS — J441 Chronic obstructive pulmonary disease with (acute) exacerbation: Secondary | ICD-10-CM | POA: Diagnosis not present

## 2020-08-17 DIAGNOSIS — I5032 Chronic diastolic (congestive) heart failure: Secondary | ICD-10-CM | POA: Diagnosis not present

## 2020-08-17 DIAGNOSIS — J189 Pneumonia, unspecified organism: Secondary | ICD-10-CM | POA: Diagnosis not present

## 2020-08-17 DIAGNOSIS — I480 Paroxysmal atrial fibrillation: Secondary | ICD-10-CM | POA: Diagnosis not present

## 2020-08-17 DIAGNOSIS — Z9981 Dependence on supplemental oxygen: Secondary | ICD-10-CM | POA: Diagnosis not present

## 2020-08-18 DIAGNOSIS — J189 Pneumonia, unspecified organism: Secondary | ICD-10-CM | POA: Diagnosis not present

## 2020-08-18 DIAGNOSIS — J441 Chronic obstructive pulmonary disease with (acute) exacerbation: Secondary | ICD-10-CM | POA: Diagnosis not present

## 2020-08-18 DIAGNOSIS — F329 Major depressive disorder, single episode, unspecified: Secondary | ICD-10-CM | POA: Diagnosis not present

## 2020-08-18 DIAGNOSIS — I480 Paroxysmal atrial fibrillation: Secondary | ICD-10-CM | POA: Diagnosis not present

## 2020-08-18 DIAGNOSIS — I5032 Chronic diastolic (congestive) heart failure: Secondary | ICD-10-CM | POA: Diagnosis not present

## 2020-08-18 LAB — CULTURE, BLOOD (ROUTINE X 2)
Culture: NO GROWTH
Culture: NO GROWTH
Special Requests: ADEQUATE

## 2020-08-22 DIAGNOSIS — J441 Chronic obstructive pulmonary disease with (acute) exacerbation: Secondary | ICD-10-CM | POA: Diagnosis not present

## 2020-08-22 DIAGNOSIS — F329 Major depressive disorder, single episode, unspecified: Secondary | ICD-10-CM | POA: Diagnosis not present

## 2020-08-22 DIAGNOSIS — I480 Paroxysmal atrial fibrillation: Secondary | ICD-10-CM | POA: Diagnosis not present

## 2020-08-22 DIAGNOSIS — I5032 Chronic diastolic (congestive) heart failure: Secondary | ICD-10-CM | POA: Diagnosis not present

## 2020-08-22 DIAGNOSIS — J189 Pneumonia, unspecified organism: Secondary | ICD-10-CM | POA: Diagnosis not present

## 2020-08-23 DIAGNOSIS — J189 Pneumonia, unspecified organism: Secondary | ICD-10-CM | POA: Diagnosis not present

## 2020-08-23 DIAGNOSIS — I5032 Chronic diastolic (congestive) heart failure: Secondary | ICD-10-CM | POA: Diagnosis not present

## 2020-08-23 DIAGNOSIS — I480 Paroxysmal atrial fibrillation: Secondary | ICD-10-CM | POA: Diagnosis not present

## 2020-08-23 DIAGNOSIS — F329 Major depressive disorder, single episode, unspecified: Secondary | ICD-10-CM | POA: Diagnosis not present

## 2020-08-23 DIAGNOSIS — J441 Chronic obstructive pulmonary disease with (acute) exacerbation: Secondary | ICD-10-CM | POA: Diagnosis not present

## 2020-08-24 DIAGNOSIS — F329 Major depressive disorder, single episode, unspecified: Secondary | ICD-10-CM | POA: Diagnosis not present

## 2020-08-24 DIAGNOSIS — J441 Chronic obstructive pulmonary disease with (acute) exacerbation: Secondary | ICD-10-CM | POA: Diagnosis not present

## 2020-08-24 DIAGNOSIS — I480 Paroxysmal atrial fibrillation: Secondary | ICD-10-CM | POA: Diagnosis not present

## 2020-08-24 DIAGNOSIS — I5032 Chronic diastolic (congestive) heart failure: Secondary | ICD-10-CM | POA: Diagnosis not present

## 2020-08-24 DIAGNOSIS — J189 Pneumonia, unspecified organism: Secondary | ICD-10-CM | POA: Diagnosis not present

## 2020-08-25 DIAGNOSIS — J189 Pneumonia, unspecified organism: Secondary | ICD-10-CM | POA: Diagnosis not present

## 2020-08-25 DIAGNOSIS — J441 Chronic obstructive pulmonary disease with (acute) exacerbation: Secondary | ICD-10-CM | POA: Diagnosis not present

## 2020-08-25 DIAGNOSIS — I5033 Acute on chronic diastolic (congestive) heart failure: Secondary | ICD-10-CM | POA: Diagnosis not present

## 2020-08-28 DIAGNOSIS — J189 Pneumonia, unspecified organism: Secondary | ICD-10-CM | POA: Diagnosis not present

## 2020-08-28 DIAGNOSIS — I480 Paroxysmal atrial fibrillation: Secondary | ICD-10-CM | POA: Diagnosis not present

## 2020-08-28 DIAGNOSIS — I5032 Chronic diastolic (congestive) heart failure: Secondary | ICD-10-CM | POA: Diagnosis not present

## 2020-08-28 DIAGNOSIS — F329 Major depressive disorder, single episode, unspecified: Secondary | ICD-10-CM | POA: Diagnosis not present

## 2020-08-28 DIAGNOSIS — J441 Chronic obstructive pulmonary disease with (acute) exacerbation: Secondary | ICD-10-CM | POA: Diagnosis not present

## 2020-08-29 DIAGNOSIS — J189 Pneumonia, unspecified organism: Secondary | ICD-10-CM | POA: Diagnosis not present

## 2020-08-29 DIAGNOSIS — F329 Major depressive disorder, single episode, unspecified: Secondary | ICD-10-CM | POA: Diagnosis not present

## 2020-08-29 DIAGNOSIS — I5032 Chronic diastolic (congestive) heart failure: Secondary | ICD-10-CM | POA: Diagnosis not present

## 2020-08-29 DIAGNOSIS — J441 Chronic obstructive pulmonary disease with (acute) exacerbation: Secondary | ICD-10-CM | POA: Diagnosis not present

## 2020-08-29 DIAGNOSIS — I480 Paroxysmal atrial fibrillation: Secondary | ICD-10-CM | POA: Diagnosis not present

## 2020-08-31 ENCOUNTER — Telehealth: Payer: Self-pay | Admitting: Cardiology

## 2020-08-31 DIAGNOSIS — J189 Pneumonia, unspecified organism: Secondary | ICD-10-CM | POA: Diagnosis not present

## 2020-08-31 DIAGNOSIS — I5032 Chronic diastolic (congestive) heart failure: Secondary | ICD-10-CM | POA: Diagnosis not present

## 2020-08-31 DIAGNOSIS — J441 Chronic obstructive pulmonary disease with (acute) exacerbation: Secondary | ICD-10-CM | POA: Diagnosis not present

## 2020-08-31 DIAGNOSIS — F329 Major depressive disorder, single episode, unspecified: Secondary | ICD-10-CM | POA: Diagnosis not present

## 2020-08-31 DIAGNOSIS — I480 Paroxysmal atrial fibrillation: Secondary | ICD-10-CM | POA: Diagnosis not present

## 2020-08-31 NOTE — Telephone Encounter (Signed)
[name] Molina called to say that the patient is experiencing circulation issue. She stated that her right leg hs discolor to it. And her feet are close to the touch and her right toe is purple. She is asking that a nurse/dr touch bases with her concerning the patient. Also the patient did completed her steroid regiment. Please advise.

## 2020-08-31 NOTE — Telephone Encounter (Signed)
Nurse in home today. During assessing for pitting edema pt reports pain.  No LE edema except a little puffy around her left ankle.    Feet are cold, purplish and blanchable, palpable pedal pulses.  No tingling or numbness.  Pt reported neuropathy to [facility] nurse.   She is a retired Marine scientist, they discussed elevating legs, compression stockings.  She wanted to let Dr. Marlou Porch know and see if he had any other recommendations.

## 2020-09-02 NOTE — Telephone Encounter (Signed)
No new recs. Thanks Candee Furbish, MD

## 2020-09-04 DIAGNOSIS — J189 Pneumonia, unspecified organism: Secondary | ICD-10-CM | POA: Diagnosis not present

## 2020-09-04 DIAGNOSIS — J441 Chronic obstructive pulmonary disease with (acute) exacerbation: Secondary | ICD-10-CM | POA: Diagnosis not present

## 2020-09-04 DIAGNOSIS — I480 Paroxysmal atrial fibrillation: Secondary | ICD-10-CM | POA: Diagnosis not present

## 2020-09-04 DIAGNOSIS — F329 Major depressive disorder, single episode, unspecified: Secondary | ICD-10-CM | POA: Diagnosis not present

## 2020-09-04 DIAGNOSIS — I5032 Chronic diastolic (congestive) heart failure: Secondary | ICD-10-CM | POA: Diagnosis not present

## 2020-09-04 NOTE — Telephone Encounter (Signed)
Adv to [facility] nurse no new recs from Dr. Marlou Porch.

## 2020-09-05 DIAGNOSIS — F329 Major depressive disorder, single episode, unspecified: Secondary | ICD-10-CM | POA: Diagnosis not present

## 2020-09-05 DIAGNOSIS — J189 Pneumonia, unspecified organism: Secondary | ICD-10-CM | POA: Diagnosis not present

## 2020-09-05 DIAGNOSIS — I5032 Chronic diastolic (congestive) heart failure: Secondary | ICD-10-CM | POA: Diagnosis not present

## 2020-09-05 DIAGNOSIS — J441 Chronic obstructive pulmonary disease with (acute) exacerbation: Secondary | ICD-10-CM | POA: Diagnosis not present

## 2020-09-05 DIAGNOSIS — I480 Paroxysmal atrial fibrillation: Secondary | ICD-10-CM | POA: Diagnosis not present

## 2020-09-06 DIAGNOSIS — J189 Pneumonia, unspecified organism: Secondary | ICD-10-CM | POA: Diagnosis not present

## 2020-09-06 DIAGNOSIS — I5032 Chronic diastolic (congestive) heart failure: Secondary | ICD-10-CM | POA: Diagnosis not present

## 2020-09-06 DIAGNOSIS — J441 Chronic obstructive pulmonary disease with (acute) exacerbation: Secondary | ICD-10-CM | POA: Diagnosis not present

## 2020-09-06 DIAGNOSIS — F329 Major depressive disorder, single episode, unspecified: Secondary | ICD-10-CM | POA: Diagnosis not present

## 2020-09-06 DIAGNOSIS — I480 Paroxysmal atrial fibrillation: Secondary | ICD-10-CM | POA: Diagnosis not present

## 2020-09-08 DIAGNOSIS — Z981 Arthrodesis status: Secondary | ICD-10-CM | POA: Diagnosis not present

## 2020-09-08 DIAGNOSIS — Z4789 Encounter for other orthopedic aftercare: Secondary | ICD-10-CM | POA: Diagnosis not present

## 2020-09-08 DIAGNOSIS — Z9889 Other specified postprocedural states: Secondary | ICD-10-CM | POA: Diagnosis not present

## 2020-09-11 DIAGNOSIS — J441 Chronic obstructive pulmonary disease with (acute) exacerbation: Secondary | ICD-10-CM | POA: Diagnosis not present

## 2020-09-11 DIAGNOSIS — I10 Essential (primary) hypertension: Secondary | ICD-10-CM | POA: Diagnosis not present

## 2020-09-11 DIAGNOSIS — J4520 Mild intermittent asthma, uncomplicated: Secondary | ICD-10-CM | POA: Diagnosis not present

## 2020-09-11 DIAGNOSIS — G43009 Migraine without aura, not intractable, without status migrainosus: Secondary | ICD-10-CM | POA: Diagnosis not present

## 2020-09-11 DIAGNOSIS — F331 Major depressive disorder, recurrent, moderate: Secondary | ICD-10-CM | POA: Diagnosis not present

## 2020-09-11 DIAGNOSIS — J449 Chronic obstructive pulmonary disease, unspecified: Secondary | ICD-10-CM | POA: Diagnosis not present

## 2020-09-11 DIAGNOSIS — E785 Hyperlipidemia, unspecified: Secondary | ICD-10-CM | POA: Diagnosis not present

## 2020-09-11 DIAGNOSIS — I480 Paroxysmal atrial fibrillation: Secondary | ICD-10-CM | POA: Diagnosis not present

## 2020-09-11 DIAGNOSIS — K219 Gastro-esophageal reflux disease without esophagitis: Secondary | ICD-10-CM | POA: Diagnosis not present

## 2020-09-13 DIAGNOSIS — I480 Paroxysmal atrial fibrillation: Secondary | ICD-10-CM | POA: Diagnosis not present

## 2020-09-13 DIAGNOSIS — I5032 Chronic diastolic (congestive) heart failure: Secondary | ICD-10-CM | POA: Diagnosis not present

## 2020-09-13 DIAGNOSIS — J189 Pneumonia, unspecified organism: Secondary | ICD-10-CM | POA: Diagnosis not present

## 2020-09-13 DIAGNOSIS — F329 Major depressive disorder, single episode, unspecified: Secondary | ICD-10-CM | POA: Diagnosis not present

## 2020-09-13 DIAGNOSIS — J441 Chronic obstructive pulmonary disease with (acute) exacerbation: Secondary | ICD-10-CM | POA: Diagnosis not present

## 2020-09-16 DIAGNOSIS — I5032 Chronic diastolic (congestive) heart failure: Secondary | ICD-10-CM | POA: Diagnosis not present

## 2020-09-16 DIAGNOSIS — F329 Major depressive disorder, single episode, unspecified: Secondary | ICD-10-CM | POA: Diagnosis not present

## 2020-09-16 DIAGNOSIS — Z7901 Long term (current) use of anticoagulants: Secondary | ICD-10-CM | POA: Diagnosis not present

## 2020-09-16 DIAGNOSIS — J189 Pneumonia, unspecified organism: Secondary | ICD-10-CM | POA: Diagnosis not present

## 2020-09-16 DIAGNOSIS — M6281 Muscle weakness (generalized): Secondary | ICD-10-CM | POA: Diagnosis not present

## 2020-09-16 DIAGNOSIS — Z6841 Body Mass Index (BMI) 40.0 and over, adult: Secondary | ICD-10-CM | POA: Diagnosis not present

## 2020-09-16 DIAGNOSIS — J441 Chronic obstructive pulmonary disease with (acute) exacerbation: Secondary | ICD-10-CM | POA: Diagnosis not present

## 2020-09-16 DIAGNOSIS — I480 Paroxysmal atrial fibrillation: Secondary | ICD-10-CM | POA: Diagnosis not present

## 2020-09-16 DIAGNOSIS — E782 Mixed hyperlipidemia: Secondary | ICD-10-CM | POA: Diagnosis not present

## 2020-09-16 DIAGNOSIS — Z9981 Dependence on supplemental oxygen: Secondary | ICD-10-CM | POA: Diagnosis not present

## 2020-09-18 DIAGNOSIS — I5032 Chronic diastolic (congestive) heart failure: Secondary | ICD-10-CM | POA: Diagnosis not present

## 2020-09-18 DIAGNOSIS — I480 Paroxysmal atrial fibrillation: Secondary | ICD-10-CM | POA: Diagnosis not present

## 2020-09-18 DIAGNOSIS — J441 Chronic obstructive pulmonary disease with (acute) exacerbation: Secondary | ICD-10-CM | POA: Diagnosis not present

## 2020-09-18 DIAGNOSIS — F329 Major depressive disorder, single episode, unspecified: Secondary | ICD-10-CM | POA: Diagnosis not present

## 2020-09-18 DIAGNOSIS — J189 Pneumonia, unspecified organism: Secondary | ICD-10-CM | POA: Diagnosis not present

## 2020-09-20 DIAGNOSIS — I480 Paroxysmal atrial fibrillation: Secondary | ICD-10-CM | POA: Diagnosis not present

## 2020-09-20 DIAGNOSIS — F329 Major depressive disorder, single episode, unspecified: Secondary | ICD-10-CM | POA: Diagnosis not present

## 2020-09-20 DIAGNOSIS — J441 Chronic obstructive pulmonary disease with (acute) exacerbation: Secondary | ICD-10-CM | POA: Diagnosis not present

## 2020-09-20 DIAGNOSIS — I5032 Chronic diastolic (congestive) heart failure: Secondary | ICD-10-CM | POA: Diagnosis not present

## 2020-09-20 DIAGNOSIS — J189 Pneumonia, unspecified organism: Secondary | ICD-10-CM | POA: Diagnosis not present

## 2020-09-21 ENCOUNTER — Telehealth: Payer: Self-pay | Admitting: Cardiology

## 2020-09-21 NOTE — Telephone Encounter (Signed)
Agree with plan thank you for the update. Candee Furbish, MD

## 2020-09-21 NOTE — Telephone Encounter (Signed)
Pt c/o swelling: STAT is pt has developed SOB within 24 hours  1) How much weight have you gained and in what time span?   2) If swelling, where is the swelling located? ankles  3) Are you currently taking a fluid pill? yes  4) Are you currently SOB? no  5) Do you have a log of your daily weights (if so, list)? 211 lbs yesterday, 208.2 lbs the 2nd  6) Have you gained 3 pounds in a day or 5 pounds in a week? no  7) Have you traveled recently? no   Erica from [place] states the patient was 211 lbs when she saw her yesterday. She states when she was seen on the 2nd she was 208.2 lbs. She states the patient is back on her normal diuretic regime. She states when she was up 5 lbs in a week they increased her diuretic. She states she has a little edema around her ankles, but this is normal for her. She states if there are any questions to call her at: [phone]

## 2020-09-21 NOTE — Telephone Encounter (Addendum)
Spoke with [name] Molina who called to inform Dr. Marlou Porch that the patient has gained about 3 lbs in 2 days. She is not having any symptoms, no SOB. She has swelling in her ankles which is at her baseline. [name] Molina has encouraged her to elevate her legs, work on leg exercies and wear compression hose. Patient is currently taking furosemide 40 mg daily

## 2020-09-22 NOTE — Telephone Encounter (Signed)
Danae Chen from [facility] is calling back in regards to the pt swelling, she states she have not heard anything from our office. She will only be available til 5pm this evening and will not be available Monday. Please advise [phone]

## 2020-09-22 NOTE — Telephone Encounter (Signed)
Spoke with [name] Molina.  She is aware no new orders at this time per Dr Marlou Porch.  Continue to monitor.

## 2020-09-25 DIAGNOSIS — J189 Pneumonia, unspecified organism: Secondary | ICD-10-CM | POA: Diagnosis not present

## 2020-09-25 DIAGNOSIS — F329 Major depressive disorder, single episode, unspecified: Secondary | ICD-10-CM | POA: Diagnosis not present

## 2020-09-25 DIAGNOSIS — I5032 Chronic diastolic (congestive) heart failure: Secondary | ICD-10-CM | POA: Diagnosis not present

## 2020-09-25 DIAGNOSIS — J441 Chronic obstructive pulmonary disease with (acute) exacerbation: Secondary | ICD-10-CM | POA: Diagnosis not present

## 2020-09-25 DIAGNOSIS — I480 Paroxysmal atrial fibrillation: Secondary | ICD-10-CM | POA: Diagnosis not present

## 2020-09-27 ENCOUNTER — Telehealth: Payer: Self-pay | Admitting: Cardiology

## 2020-09-27 DIAGNOSIS — J189 Pneumonia, unspecified organism: Secondary | ICD-10-CM | POA: Diagnosis not present

## 2020-09-27 DIAGNOSIS — I480 Paroxysmal atrial fibrillation: Secondary | ICD-10-CM | POA: Diagnosis not present

## 2020-09-27 DIAGNOSIS — I5032 Chronic diastolic (congestive) heart failure: Secondary | ICD-10-CM | POA: Diagnosis not present

## 2020-09-27 DIAGNOSIS — F329 Major depressive disorder, single episode, unspecified: Secondary | ICD-10-CM | POA: Diagnosis not present

## 2020-09-27 DIAGNOSIS — J441 Chronic obstructive pulmonary disease with (acute) exacerbation: Secondary | ICD-10-CM | POA: Diagnosis not present

## 2020-09-27 NOTE — Telephone Encounter (Signed)
[name] Molina with [facility] [phone] called to report the pts assessment:  Weight; 5/2 208 lbs 5/4 211 lbs 5/9 213 lbs 5/11 217 lbs  She has some bilateral ankle edema... no SOB.   She has been watching her NA intake but she did recently eat a fast Food Fish sandwich... but she is generally careful.   She is also elevating her feet when sitting.   Will forward to Dr. Marlou Porch for review. She will continue to monitor.   BP 124/78 O2 Sat 96% Next OV 10/2020

## 2020-09-27 NOTE — Telephone Encounter (Signed)
Patient's home health nurse was calling to speak with Dr. Marlou Porch or nurse.

## 2020-09-28 NOTE — Telephone Encounter (Signed)
Lm on pt' VM to call back for orders

## 2020-09-28 NOTE — Telephone Encounter (Signed)
Let's increase lasix from 40mg  PO QD to 80mg  PO BID for the next 7 days. Monitor for fluid weight loss.  If successful after a week, change maintenance therapy to lasix 40mg  PO BID If available, please have her seen by APP. Thanks  Candee Furbish, MD

## 2020-09-28 NOTE — Telephone Encounter (Signed)
Lm at pt's home # listed in chart to c/b regarding medication change/orders.

## 2020-09-28 NOTE — Telephone Encounter (Signed)
Called and spoke with [name] Molina [facility])  Reviewed Dr Marlou Porch orders with her.  She will contact the pt and make sure she follows through.  [name] Molina reports sometimes the pt doesn't answer the phone if she doesn't know the number.  [name] Molina reports they will call back after 1 week if no improvement.    Also sent the orders to pt via MyChart

## 2020-09-29 ENCOUNTER — Encounter: Payer: Self-pay | Admitting: Emergency Medicine

## 2020-09-29 ENCOUNTER — Ambulatory Visit (INDEPENDENT_AMBULATORY_CARE_PROVIDER_SITE_OTHER): Payer: Medicare Other | Admitting: Emergency Medicine

## 2020-09-29 ENCOUNTER — Other Ambulatory Visit: Payer: Self-pay

## 2020-09-29 DIAGNOSIS — J9611 Chronic respiratory failure with hypoxia: Secondary | ICD-10-CM | POA: Diagnosis not present

## 2020-09-29 DIAGNOSIS — J301 Allergic rhinitis due to pollen: Secondary | ICD-10-CM | POA: Diagnosis not present

## 2020-09-29 DIAGNOSIS — J449 Chronic obstructive pulmonary disease, unspecified: Secondary | ICD-10-CM | POA: Diagnosis not present

## 2020-09-29 MED ORDER — AZITHROMYCIN 250 MG PO TABS: 250.0000 mg | ORAL_TABLET | Freq: Every day | ORAL | 1 refills | Status: DC

## 2020-09-29 MED ORDER — IPRATROPIUM-ALBUTEROL 0.5-2.5 (3) MG/3ML IN SOLN: 3.0000 mL | Freq: Two times a day (BID) | RESPIRATORY_TRACT | 0 refills | Status: DC

## 2020-09-29 NOTE — Assessment & Plan Note (Signed)
She had a severe exacerbation in late March.  Chest x-ray reassuring.  Treated with antibiotics and steroids.  She went home on Trelegy and also DuoNeb twice daily.  I think at this point we can stop the scheduled DuoNeb, continue the Trelegy.  She can keep the DuoNeb and albuterol HFA available to use if needed.  Please continue Trelegy 1 inhalation once daily.  Rinse and gargle after using. Keep your albuterol inhaler available use 2 puffs when needed for shortness of breath, chest tightness, wheezing. You can stop taking DuoNeb every day on a schedule.  Keep it available to use if you need it.  You can use it up to every 6 hours if you are having shortness of breath, wheezing, chest tightness. You would benefit from getting the COVID-19 booster shot.  Please look into this. Follow with Dr Lamonte Sakai in 4 months or sooner if you have any problems.

## 2020-09-29 NOTE — Assessment & Plan Note (Signed)
Continue Singulair (montelukast) evening Keep your fluticasone nasal spray available to use 2 sprays each nostril if you need it for increased allergy symptoms. Try stopping your loratadine and changing to cetirizine (Zyrtec) once daily to see if you get more benefit.

## 2020-09-29 NOTE — Progress Notes (Signed)
Subjective:    Patient ID: [name] Molina, female    DOB: 1946/05/13, 75 y.o.   MRN: [id number] HPI  ROV 04/04/20 --75 year old woman with a history of COPD/fixed asthma, chronic cough in the setting of allergic rhinitis and GERD.  She has severe obstruction on pulmonary function testing 12/2018 with an FEV1 of 1.22 L (57% predicted).  We have been managing her on Trelegy. Uses albuterol rarely.  She uses loratadine daily, Singulair during the allergy season, fluticasone nasal spray.  Omeprazole daily. She reports that her breathing has been good lately. She had an acute exacerbation COPD and CHF, was found to have A Fib at that time. She has a monitor that shows that she is in / out A Fib, had tachycardia as recently as 11/12.    ROV 09/29/20 --75 year old woman follows up today for her history of COPD/fixed asthma with severe obstruction, allergic rhinitis, GERD.  She has chronic cough in the setting of the above. Currently managed on Trelegy, using albuterol HFA seldom, was continued on DuoNeb bid post-hospital.  Remains on Singulair, fluticasone nasal spray prn, loratadine. Concerned that the loratadine may not be working.  Omeprazole qd  She was in the hospital with a flare of her COPD at the end of March 2022.  COVID was negative.  Chest x-ray without any obvious pneumonia, showed bibasilar atelectasis and right lower lobe scar  Chest x-ray 08/13/2020 reviewed by me as above.  No flowsheet data found.     Objective:   Physical Exam Vitals:   09/29/20 1206  BP: 112/68  Pulse: 74  Temp: (!) 97.3 F (36.3 C)  TempSrc: Temporal  SpO2: 94%  Weight: 215 lb 12.8 oz (97.9 kg)  Height: 5' 2.5" (1.588 m)   Gen: Pleasant, overweight woman, in no distress,  normal affect, wheelchair  ENT: No lesions,  mouth clear,  oropharynx clear, no postnasal drip  Neck: No JVD, no stridor  Lungs: No use of accessory muscles, distant, few scattered rhonchi, no wheeze  Cardiovascular: RRR, heart sounds  normal, no murmur or gallops  Musculoskeletal: no deformities  Neuro: alert, non focal  Skin: Warm, no lesions or rashes     Assessment & Plan:  COPD (chronic obstructive pulmonary disease) ([place]) She had a severe exacerbation in late March.  Chest x-ray reassuring.  Treated with antibiotics and steroids.  She went home on Trelegy and also DuoNeb twice daily.  I think at this point we can stop the scheduled DuoNeb, continue the Trelegy.  She can keep the DuoNeb and albuterol HFA available to use if needed.  Please continue Trelegy 1 inhalation once daily.  Rinse and gargle after using. Keep your albuterol inhaler available use 2 puffs when needed for shortness of breath, chest tightness, wheezing. You can stop taking DuoNeb every day on a schedule.  Keep it available to use if you need it.  You can use it up to every 6 hours if you are having shortness of breath, wheezing, chest tightness. You would benefit from getting the COVID-19 booster shot.  Please look into this. Follow with Dr Lamonte Sakai in 4 months or sooner if you have any problems.  Allergic rhinitis Continue Singulair (montelukast) evening Keep your fluticasone nasal spray available to use 2 sprays each nostril if you need it for increased allergy symptoms. Try stopping your loratadine and changing to cetirizine (Zyrtec) once daily to see if you get more benefit.   Chronic respiratory failure with hypoxia (HCC) You need to use your  oxygen at 2 L/min with significant exertion.  Our goal is to keep your oxygen saturations greater than 90% even when you are exerting yourself.  Baltazar Apo, MD, PhD 09/29/2020, 6:10 PM [facility] [phone] or if no answer [phone]

## 2020-09-29 NOTE — Patient Instructions (Signed)
Please continue Trelegy 1 inhalation once daily.  Rinse and gargle after using. Keep your albuterol inhaler available use 2 puffs when needed for shortness of breath, chest tightness, wheezing. You can stop taking DuoNeb every day on a schedule.  Keep it available to use if you need it.  You can use it up to every 6 hours if you are having shortness of breath, wheezing, chest tightness. Continue Singulair (montelukast) evening Keep your fluticasone nasal spray available to use 2 sprays each nostril if you need it for increased allergy symptoms. Try stopping your loratadine and changing to cetirizine (Zyrtec) once daily to see if you get more benefit. You need to use your oxygen at 2 L/min with significant exertion.  Our goal is to keep your oxygen saturations greater than 90% even when you are exerting yourself. You would benefit from getting the COVID-19 booster shot.  Please look into this. Follow with Dr Lamonte Sakai in 4 months or sooner if you have any problems.

## 2020-09-29 NOTE — Assessment & Plan Note (Signed)
You need to use your oxygen at 2 L/min with significant exertion.  Our goal is to keep your oxygen saturations greater than 90% even when you are exerting yourself.

## 2020-10-02 ENCOUNTER — Telehealth: Payer: Self-pay | Admitting: Emergency Medicine

## 2020-10-02 ENCOUNTER — Telehealth: Payer: Self-pay | Admitting: Cardiology

## 2020-10-02 DIAGNOSIS — J441 Chronic obstructive pulmonary disease with (acute) exacerbation: Secondary | ICD-10-CM | POA: Diagnosis not present

## 2020-10-02 DIAGNOSIS — F329 Major depressive disorder, single episode, unspecified: Secondary | ICD-10-CM | POA: Diagnosis not present

## 2020-10-02 DIAGNOSIS — J189 Pneumonia, unspecified organism: Secondary | ICD-10-CM | POA: Diagnosis not present

## 2020-10-02 DIAGNOSIS — I5032 Chronic diastolic (congestive) heart failure: Secondary | ICD-10-CM | POA: Diagnosis not present

## 2020-10-02 DIAGNOSIS — I480 Paroxysmal atrial fibrillation: Secondary | ICD-10-CM | POA: Diagnosis not present

## 2020-10-02 MED ORDER — FUROSEMIDE 40 MG PO TABS: 40.0000 mg | ORAL_TABLET | Freq: Every day | ORAL | 1 refills | Status: DC

## 2020-10-02 MED ORDER — CETIRIZINE HCL 10 MG PO TABS: 10.0000 mg | ORAL_TABLET | Freq: Every day | ORAL | 4 refills | Status: DC

## 2020-10-02 NOTE — Telephone Encounter (Signed)
*  STAT* If patient is at the pharmacy, call can be transferred to refill team.   1. Which medications need to be refilled? (please list name of each medication and dose if known) furosemide (LASIX) 40 MG tablet(Expired)  2. Which pharmacy/location (including street and city if local pharmacy) is medication to be sent to?[facility] - [place], [place] - [place] [address]  3. Do they need a 30 day or 90 day supply? 90 day supply

## 2020-10-02 NOTE — Telephone Encounter (Signed)
Called and spoke with patient. She stated that she had her medications delivered to her today and received a bottle of azithromycin 30 tablets, 1 refill. She was confused because this was not discussed during her OV. I reviewed her OV and did not see any mentioned of azithromycin. I explained to her what is used for. She stated that she has not been sick and does not need an antibiotic. She has not opened the medication yet. I offered to call the pharmacy to see if she bring the bottle in for a refund.   Called [facility] and spoke with Katrina. She stated that they will take the azithromycin back but the patient has to call the pharmacy herself to let them know. They will also refund her.   Called patient, she is aware of this.   Nothing further needed at time of call.

## 2020-10-02 NOTE — Telephone Encounter (Signed)
Pt's medication was sent to pt's pharmacy as requested. Confirmation received.  °

## 2020-10-03 ENCOUNTER — Ambulatory Visit (INDEPENDENT_AMBULATORY_CARE_PROVIDER_SITE_OTHER): Payer: Medicare Other | Admitting: Physician Assistant

## 2020-10-03 ENCOUNTER — Other Ambulatory Visit: Payer: Self-pay

## 2020-10-03 ENCOUNTER — Encounter: Payer: Self-pay | Admitting: Physician Assistant

## 2020-10-03 ENCOUNTER — Telehealth: Payer: Self-pay | Admitting: Emergency Medicine

## 2020-10-03 DIAGNOSIS — L089 Local infection of the skin and subcutaneous tissue, unspecified: Secondary | ICD-10-CM | POA: Diagnosis not present

## 2020-10-03 DIAGNOSIS — D485 Neoplasm of uncertain behavior of skin: Secondary | ICD-10-CM | POA: Diagnosis not present

## 2020-10-03 DIAGNOSIS — L304 Erythema intertrigo: Secondary | ICD-10-CM

## 2020-10-03 DIAGNOSIS — Z85828 Personal history of other malignant neoplasm of skin: Secondary | ICD-10-CM | POA: Diagnosis not present

## 2020-10-03 MED ORDER — MUPIROCIN 2 % EX OINT: 1.0000 "application " | TOPICAL_OINTMENT | Freq: Two times a day (BID) | CUTANEOUS | 9 refills | Status: DC

## 2020-10-03 MED ORDER — ALCLOMETASONE DIPROPIONATE 0.05 % EX CREA: TOPICAL_CREAM | Freq: Two times a day (BID) | CUTANEOUS | 3 refills | Status: DC | PRN

## 2020-10-03 MED ORDER — KETOCONAZOLE 2 % EX CREA: 1.0000 "application " | TOPICAL_CREAM | Freq: Two times a day (BID) | CUTANEOUS | 10 refills | Status: AC

## 2020-10-03 NOTE — Telephone Encounter (Signed)
Called and spoke with [facility] from [facility] and she is aware of the call yesterday from the pt.   She wanted to make sure that the pt was to not take the azithromycin.  She is going to follow up with the pt today and see if she called the pharmacy back.  Nothing further is needed.

## 2020-10-03 NOTE — Progress Notes (Signed)
   Follow-Up Visit   Subjective  [name] Molina is a 75 y.o. female who presents for the following: Skin Problem (New lesion x months- "growing the last year or so" . Personal history of non mole skin cancers, but denies any history of melanoma ). She has had issues with her pubic/inguinal area being irritated x years. It is flaring significantly. It burns and stings. Per review she was given clobetasol cream which helped a lot but no one would give her refills.   The following portions of the chart were reviewed this encounter and updated as appropriate:      Objective  Well appearing patient in no apparent distress; mood and affect are within normal limits.  A full examination was performed including scalp, head, eyes, ears, nose, lips, neck, chest, axillae, abdomen, back, buttocks, bilateral upper extremities, bilateral lower extremities, hands, feet, fingers, toes, fingernails, and toenails. All findings within normal limits unless otherwise noted below.  Objective  Left Inguinal Area: Thick smooth raised plaque     Objective  Right Suprapubic Area: Severe, beefy red with slight scale and odor   Assessment & Plan  Neoplasm of uncertain behavior of skin Left Inguinal Area  Skin / nail biopsy Type of biopsy: tangential   Informed consent: discussed and consent obtained   Timeout: patient name, date of birth, surgical site, and procedure verified   Procedure prep:  Patient was prepped and draped in usual sterile fashion (Non sterile) Prep type:  Chlorhexidine Anesthesia: the lesion was anesthetized in a standard fashion   Anesthetic:  1% lidocaine w/ epinephrine 1-100,000 local infiltration Instrument used: flexible razor blade   Outcome: patient tolerated procedure well   Post-procedure details: wound care instructions given    Specimen 1 - Surgical pathology Differential Diagnosis: r/o sk  Check Margins: No  Erythema intertrigo Right Suprapubic Area- folds of  abdomen and groin  Anaerobic and Aerobic Culture - Right Suprapubic Area  ketoconazole (NIZORAL) 2 % cream - Right Suprapubic Area  alclomethasone (ACLOVATE) 0.05 % cream - Right Suprapubic Area  mupirocin ointment (BACTROBAN) 2 % - Right Suprapubic Area  Infection of skin and subcutaneous tissue  Other Related Procedures Anaerobic and Aerobic Culture    I, Jeremey Bascom, PA-C, have reviewed all documentation's for this visit.  The documentation on 10/03/20 for the exam, diagnosis, procedures and orders are all accurate and complete.

## 2020-10-03 NOTE — Patient Instructions (Signed)

## 2020-10-04 ENCOUNTER — Other Ambulatory Visit: Payer: Self-pay | Admitting: Physician Assistant

## 2020-10-04 DIAGNOSIS — L820 Inflamed seborrheic keratosis: Secondary | ICD-10-CM | POA: Diagnosis not present

## 2020-10-05 ENCOUNTER — Telehealth: Payer: Self-pay | Admitting: Physician Assistant

## 2020-10-05 NOTE — Telephone Encounter (Signed)
-----   Message from [place] Danes, [place] sent at 10/05/2020  8:06 AM EDT ----- Check status

## 2020-10-05 NOTE — Telephone Encounter (Signed)
Phone call to patient to check to see how she was doing. Left message for her to call back.

## 2020-10-08 LAB — ANAEROBIC AND AEROBIC CULTURE
MICRO NUMBER:: 11900021
SPECIMEN QUALITY:: ADEQUATE

## 2020-10-09 ENCOUNTER — Telehealth: Payer: Self-pay

## 2020-10-09 NOTE — Telephone Encounter (Signed)
Phone call to [facility] to see why they didn't run the patient's culture since wound was selected for the purpose of the test.  Per Claiborne Billings with [facility] the source was selected as skin and not wound that's why the test wasn't performed.

## 2020-10-09 NOTE — Telephone Encounter (Signed)
Phone call to patient per [facility] recommendations to check on her status.  Per patient she is doing well, the area is healing slowly.  [place] aware.

## 2020-10-09 NOTE — Telephone Encounter (Signed)
-----   Message from Kelli R Sheffield, PA-C sent at 10/05/2020  8:06 AM EDT ----- Check status 

## 2020-10-09 NOTE — Telephone Encounter (Signed)
Fax received from [employer] [employer] stating that the patient's culture wasn't performed due to the source not being acceptable for test requested.

## 2020-10-11 ENCOUNTER — Telehealth: Payer: Self-pay

## 2020-10-11 NOTE — Telephone Encounter (Signed)
No numbers work for this patient

## 2020-10-11 NOTE — Telephone Encounter (Signed)
-----   Message from [place] Danes, [place] sent at 10/10/2020  6:27 PM EDT ----- Check status again please

## 2020-10-12 ENCOUNTER — Telehealth: Payer: Self-pay | Admitting: Cardiology

## 2020-10-12 DIAGNOSIS — J441 Chronic obstructive pulmonary disease with (acute) exacerbation: Secondary | ICD-10-CM | POA: Diagnosis not present

## 2020-10-12 DIAGNOSIS — J189 Pneumonia, unspecified organism: Secondary | ICD-10-CM | POA: Diagnosis not present

## 2020-10-12 DIAGNOSIS — I480 Paroxysmal atrial fibrillation: Secondary | ICD-10-CM | POA: Diagnosis not present

## 2020-10-12 DIAGNOSIS — F329 Major depressive disorder, single episode, unspecified: Secondary | ICD-10-CM | POA: Diagnosis not present

## 2020-10-12 DIAGNOSIS — I5032 Chronic diastolic (congestive) heart failure: Secondary | ICD-10-CM | POA: Diagnosis not present

## 2020-10-12 NOTE — Telephone Encounter (Signed)
Agree with plan Thanh Pomerleau, MD  

## 2020-10-12 NOTE — Telephone Encounter (Signed)
RN returned call to [place], home health nurse who states patient last weighed 209 on 5/16. Today patient weighs 214. Patient is not experiencing Shortness of breath but does have crackles noted to BLL. Trace-1+ edema noted in BLE. Patient took an extra lasix this AM for a total of 80mg . Benjamine Mola would like to know if patient needs to do anything additional or continue to monitor. Please advise.

## 2020-10-12 NOTE — Telephone Encounter (Signed)
Reviewed with Dr Marlou Porch who gives verbal order to continue 80 mg of Furosemide for the next 3 days then return to normal dose.  If wt doesn't return to baseline - pls call.

## 2020-10-12 NOTE — Telephone Encounter (Signed)
Pt c/o swelling: STAT is pt has developed SOB within 24 hours  1) How much weight have you gained and in what time span? Patient is 214 today  2) If swelling, where is the swelling located? Not sure  3) Are you currently taking a fluid pill? Yes 80 mg Lasix   4) Are you currently SOB? No   5) Do you have a log of your daily weights (if so, list)? Nurse has the information  6) Have you gained 3 pounds in a day or 5 pounds in a week? Yes   7) Have you traveled recently? No    [name] Molina from [facility] calling to report the weight gain.

## 2020-10-12 NOTE — Telephone Encounter (Signed)
Called and spoke with [name] Molina.  She is aware of Dr Marlou Porch orders and will notify the patient.

## 2020-10-13 DIAGNOSIS — I480 Paroxysmal atrial fibrillation: Secondary | ICD-10-CM | POA: Diagnosis not present

## 2020-10-13 DIAGNOSIS — F329 Major depressive disorder, single episode, unspecified: Secondary | ICD-10-CM | POA: Diagnosis not present

## 2020-10-13 DIAGNOSIS — M19042 Primary osteoarthritis, left hand: Secondary | ICD-10-CM | POA: Diagnosis not present

## 2020-10-13 DIAGNOSIS — J449 Chronic obstructive pulmonary disease, unspecified: Secondary | ICD-10-CM | POA: Diagnosis not present

## 2020-10-13 DIAGNOSIS — I5033 Acute on chronic diastolic (congestive) heart failure: Secondary | ICD-10-CM | POA: Diagnosis not present

## 2020-10-13 DIAGNOSIS — J441 Chronic obstructive pulmonary disease with (acute) exacerbation: Secondary | ICD-10-CM | POA: Diagnosis not present

## 2020-10-13 DIAGNOSIS — D509 Iron deficiency anemia, unspecified: Secondary | ICD-10-CM | POA: Diagnosis not present

## 2020-10-13 DIAGNOSIS — K219 Gastro-esophageal reflux disease without esophagitis: Secondary | ICD-10-CM | POA: Diagnosis not present

## 2020-10-13 DIAGNOSIS — M19041 Primary osteoarthritis, right hand: Secondary | ICD-10-CM | POA: Diagnosis not present

## 2020-10-13 DIAGNOSIS — I10 Essential (primary) hypertension: Secondary | ICD-10-CM | POA: Diagnosis not present

## 2020-10-13 DIAGNOSIS — J4520 Mild intermittent asthma, uncomplicated: Secondary | ICD-10-CM | POA: Diagnosis not present

## 2020-10-13 DIAGNOSIS — E785 Hyperlipidemia, unspecified: Secondary | ICD-10-CM | POA: Diagnosis not present

## 2020-10-16 DIAGNOSIS — I110 Hypertensive heart disease with heart failure: Secondary | ICD-10-CM | POA: Diagnosis not present

## 2020-10-16 DIAGNOSIS — Z6841 Body Mass Index (BMI) 40.0 and over, adult: Secondary | ICD-10-CM | POA: Diagnosis not present

## 2020-10-16 DIAGNOSIS — J441 Chronic obstructive pulmonary disease with (acute) exacerbation: Secondary | ICD-10-CM | POA: Diagnosis not present

## 2020-10-16 DIAGNOSIS — M6281 Muscle weakness (generalized): Secondary | ICD-10-CM | POA: Diagnosis not present

## 2020-10-16 DIAGNOSIS — Z7901 Long term (current) use of anticoagulants: Secondary | ICD-10-CM | POA: Diagnosis not present

## 2020-10-16 DIAGNOSIS — I480 Paroxysmal atrial fibrillation: Secondary | ICD-10-CM | POA: Diagnosis not present

## 2020-10-16 DIAGNOSIS — I5032 Chronic diastolic (congestive) heart failure: Secondary | ICD-10-CM | POA: Diagnosis not present

## 2020-10-16 DIAGNOSIS — Z9981 Dependence on supplemental oxygen: Secondary | ICD-10-CM | POA: Diagnosis not present

## 2020-10-16 DIAGNOSIS — E782 Mixed hyperlipidemia: Secondary | ICD-10-CM | POA: Diagnosis not present

## 2020-10-16 DIAGNOSIS — F329 Major depressive disorder, single episode, unspecified: Secondary | ICD-10-CM | POA: Diagnosis not present

## 2020-10-17 ENCOUNTER — Telehealth: Payer: Self-pay | Admitting: Physician Assistant

## 2020-10-17 NOTE — Telephone Encounter (Signed)
Patient left message on office voice mail that she was calling for pathology results from last visit with Kelli Sheffield, PA-C. 

## 2020-10-17 NOTE — Telephone Encounter (Signed)
Path to patient, also advised mupirocin ointment for wound from biopsy, alclometasone and ketoconazole for folds.

## 2020-10-18 DIAGNOSIS — F329 Major depressive disorder, single episode, unspecified: Secondary | ICD-10-CM | POA: Diagnosis not present

## 2020-10-18 DIAGNOSIS — I5032 Chronic diastolic (congestive) heart failure: Secondary | ICD-10-CM | POA: Diagnosis not present

## 2020-10-18 DIAGNOSIS — I480 Paroxysmal atrial fibrillation: Secondary | ICD-10-CM | POA: Diagnosis not present

## 2020-10-18 DIAGNOSIS — J441 Chronic obstructive pulmonary disease with (acute) exacerbation: Secondary | ICD-10-CM | POA: Diagnosis not present

## 2020-10-18 DIAGNOSIS — I110 Hypertensive heart disease with heart failure: Secondary | ICD-10-CM | POA: Diagnosis not present

## 2020-10-20 DIAGNOSIS — I110 Hypertensive heart disease with heart failure: Secondary | ICD-10-CM | POA: Diagnosis not present

## 2020-10-20 DIAGNOSIS — I480 Paroxysmal atrial fibrillation: Secondary | ICD-10-CM | POA: Diagnosis not present

## 2020-10-20 DIAGNOSIS — I5032 Chronic diastolic (congestive) heart failure: Secondary | ICD-10-CM | POA: Diagnosis not present

## 2020-10-20 DIAGNOSIS — J441 Chronic obstructive pulmonary disease with (acute) exacerbation: Secondary | ICD-10-CM | POA: Diagnosis not present

## 2020-10-20 DIAGNOSIS — F329 Major depressive disorder, single episode, unspecified: Secondary | ICD-10-CM | POA: Diagnosis not present

## 2020-10-23 MED ORDER — FUROSEMIDE 40 MG PO TABS: 80.0000 mg | ORAL_TABLET | Freq: Every day | ORAL | 3 refills | Status: DC

## 2020-10-25 ENCOUNTER — Encounter: Payer: Self-pay | Admitting: Physician Assistant

## 2020-10-25 DIAGNOSIS — I110 Hypertensive heart disease with heart failure: Secondary | ICD-10-CM | POA: Diagnosis not present

## 2020-10-25 DIAGNOSIS — F329 Major depressive disorder, single episode, unspecified: Secondary | ICD-10-CM | POA: Diagnosis not present

## 2020-10-25 DIAGNOSIS — I5032 Chronic diastolic (congestive) heart failure: Secondary | ICD-10-CM | POA: Diagnosis not present

## 2020-10-25 DIAGNOSIS — J441 Chronic obstructive pulmonary disease with (acute) exacerbation: Secondary | ICD-10-CM | POA: Diagnosis not present

## 2020-10-25 DIAGNOSIS — I480 Paroxysmal atrial fibrillation: Secondary | ICD-10-CM | POA: Diagnosis not present

## 2020-10-30 DIAGNOSIS — I480 Paroxysmal atrial fibrillation: Secondary | ICD-10-CM | POA: Diagnosis not present

## 2020-10-30 DIAGNOSIS — I5032 Chronic diastolic (congestive) heart failure: Secondary | ICD-10-CM | POA: Diagnosis not present

## 2020-10-30 DIAGNOSIS — J441 Chronic obstructive pulmonary disease with (acute) exacerbation: Secondary | ICD-10-CM | POA: Diagnosis not present

## 2020-10-30 DIAGNOSIS — F329 Major depressive disorder, single episode, unspecified: Secondary | ICD-10-CM | POA: Diagnosis not present

## 2020-10-30 DIAGNOSIS — I110 Hypertensive heart disease with heart failure: Secondary | ICD-10-CM | POA: Diagnosis not present

## 2020-10-31 ENCOUNTER — Encounter: Payer: Self-pay | Admitting: Physician Assistant

## 2020-10-31 ENCOUNTER — Encounter (INDEPENDENT_AMBULATORY_CARE_PROVIDER_SITE_OTHER): Payer: Medicare Other | Admitting: Ophthalmology

## 2020-10-31 ENCOUNTER — Other Ambulatory Visit: Payer: Self-pay

## 2020-10-31 DIAGNOSIS — H35033 Hypertensive retinopathy, bilateral: Secondary | ICD-10-CM

## 2020-10-31 DIAGNOSIS — H353112 Nonexudative age-related macular degeneration, right eye, intermediate dry stage: Secondary | ICD-10-CM | POA: Diagnosis not present

## 2020-10-31 DIAGNOSIS — H43813 Vitreous degeneration, bilateral: Secondary | ICD-10-CM | POA: Diagnosis not present

## 2020-10-31 DIAGNOSIS — H353221 Exudative age-related macular degeneration, left eye, with active choroidal neovascularization: Secondary | ICD-10-CM | POA: Diagnosis not present

## 2020-10-31 DIAGNOSIS — I10 Essential (primary) hypertension: Secondary | ICD-10-CM | POA: Diagnosis not present

## 2020-10-31 NOTE — Progress Notes (Addendum)
Cardiology Office Note    Date:  11/01/2020   ID:  [name] Molina, [name] Molina [birth year], MRN [id number]  PCP:  Harlan Stains, MD  Cardiologist:  Candee Furbish, MD  Electrophysiologist:  None   Chief Complaint: f/u CHF  History of Present Illness:   [name] Molina is a 75 y.o. female with history of PAF, PAT, COPD, OSA, obesity, peripheral neuropathy, spinal stenosis, HTN, HLD, probable chronic diastolic CHF, aortic atherosclerosis, depression, GERD, debilitation using assistive aid who presents for follow-up. She was seen by Dr. Marlou Porch in 2019 in consultation for pulmonary edema with normal LVEF and diastolic dysfunction at that time. She was re-evaluated in 08/2019 during admission for new onset atrial fib with RVR. She was started on anticoagulation and rate control. 2D echo 08/2019 EF 65-70%, mildly dilated LA, no LVH, mild LAE, technically difficult d/t habitus. F/u monitor 10/2017 showed predominantly NSR with brief episodes of atrial tachycardia. She was last admitted 07/2020 with hypoxic respiratory failure in setting of PNA and COPD exacerbation. She also was diuresed with IV Lasix. hsTroponins were 16-19 felt due to demand. Phone note 10/12/20 also indicated plan for titration of Lasix due to increased volume.   In talking with the patient it sounds like she had issues getting the rx to her pharmacy. It was re-sent in June 6th per [facility] and she started the increased dose on 6/9. I clarified with her how exactly she is taking this. She was previously taking 40mg  once daily and now has been taking 80mg  BID. She has seen a steady decrease in her home weight from 214 back to down to 206-207. Tomorrow is her last day of the increased dose. She did not yet take any today though due to having the office visit. Edema has improved. She has chronic appearing violaceous discoloration of her lower legs and feet with varicose veins. She has no classic claudication symptoms. She does notice if she accidentally hits  herself against the leg it will hurt longer than expected. No ulceration. She denies any SOB. No chest pain, palpitations, dizziness, syncope, falling. She has not used CPAP in many years. Last sleep study was in 2018 but states she slept poorly at that time due to a migraine.  Labwork independently reviewed: 07/2020 Mg 2.3, K 4.4, Cr 0.75, Hgb 11.9, d-dimer normal, A1C 5.7, troponin 16-19 05/2020 LFTs wnl KPN 01/2020 LDL 79  Past Medical History:  Diagnosis Date   Anxiety    Aortic atherosclerosis (HCC)    Arthritis    Asthma    Atrial tachycardia (HCC)    Cancer (HCC)    basal skin cancer on nose   Cataract    Chronic diastolic CHF (congestive heart failure) (HCC)    Complication of anesthesia    during cervical surgery she woke up during the procedure   COPD (chronic obstructive pulmonary disease) ([place])    Depression    Diverticulosis    Family history of adverse reaction to anesthesia    mother had post op N&V   GERD (gastroesophageal reflux disease)    History of hiatal hernia    Hyperlipidemia    Hypertension    Insomnia    Macular degeneration    Migraines    Neuropathy    Obesity    PAF (paroxysmal atrial fibrillation) ([place])    Pneumonia    Sleep apnea    history of it,not using a cpap, study 2018: didn't need cpap   Thyroid nodule 2000   radioactive caspule  Past Surgical History:  Procedure Laterality Date   ANTERIOR CERVICAL DECOMP/DISCECTOMY FUSION N/A 06/15/2020   Procedure: ANTERIOR CERVICAL DECOMPRESSION FUSION CERVICAL FIVE-CERVICAL SIX WITH INSTRUMENTATION AND ALLOGRAFT;  Surgeon: Phylliss Bob, MD;  Location: [place];  Service: Orthopedics;  Laterality: N/A;  anterior   CERVICAL POLYPECTOMY     INSIDE AND OUTSIDE   COLONOSCOPY     EYE SURGERY Bilateral    cataracts   POLYPECTOMY     REMOVED CARTLIAGE RIB      Current Medications: Current Meds  Medication Sig   acetaminophen (TYLENOL) 650 MG CR tablet Take 1,300 mg by mouth every 8 (eight) hours  as needed for pain.   albuterol (PROVENTIL HFA;VENTOLIN HFA) 108 (90 Base) MCG/ACT inhaler Inhale 2 puffs into the lungs every 6 (six) hours as needed for wheezing or shortness of breath.   alclomethasone (ACLOVATE) 0.05 % cream Apply topically 2 (two) times daily as needed (Rash).   BESIVANCE 0.6 % SUSP Place 1 drop into the left eye See admin instructions. Instill 1 drop qid day of eye injection and qid the day after.   cetirizine (ZYRTEC) 10 MG tablet Take 1 tablet (10 mg total) by mouth daily.   Cyanocobalamin (VITAMIN B-12 PO) Take 2,500 mcg by mouth daily.   DULoxetine (CYMBALTA) 60 MG capsule Take 60 mg by mouth every morning.   ELIQUIS 5 MG TABS tablet Take 5 mg by mouth 2 (two) times daily.   fluticasone (FLONASE) 50 MCG/ACT nasal spray Place 1 spray into both nostrils daily.   furosemide (LASIX) 40 MG tablet Take 2 tablets (80 mg total) by mouth daily. Take (2) tablets twice a day for 7 days then return to 40 mg a day (currently taking 80mg  BID)   ipratropium-albuterol (DUONEB) 0.5-2.5 (3) MG/3ML SOLN Take 3 mLs by nebulization 2 (two) times daily.   ketoconazole (NIZORAL) 2 % cream Apply 1 application topically in the morning and at bedtime.   LORazepam (ATIVAN) 0.5 MG tablet Take 0.5-1 mg by mouth daily as needed for anxiety. Anxiety   metoprolol succinate (TOPROL-XL) 50 MG 24 hr tablet Take 1 tablet (50 mg total) by mouth daily. Take with or immediately following a meal.   montelukast (SINGULAIR) 10 MG tablet Take 10 mg by mouth at bedtime.   Multiple Vitamins-Minerals (OCUVITE EYE + MULTI) TABS Take 1 tablet by mouth in the morning and at bedtime.   mupirocin ointment (BACTROBAN) 2 % Apply 1 application topically 2 (two) times daily.   omeprazole (PRILOSEC) 20 MG capsule Take 20 mg by mouth at bedtime.   ondansetron (ZOFRAN) 4 MG tablet Take 1 tablet (4 mg total) by mouth every 8 (eight) hours as needed for nausea or vomiting.   pravastatin (PRAVACHOL) 40 MG tablet Take 40 mg by  mouth at bedtime.    TRELEGY ELLIPTA 100-62.5-25 MCG/INH AEPB INHALE 1 PUFF BY MOUTH EVERY DAY      Allergies:   Food, Tequin, Codeine, Erythromycin, Levaquin [levofloxacin in d5w], Oxycodone-acetaminophen, Wellbutrin [bupropion hcl], Penicillins, and Prednisone   Social History   Socioeconomic History   Marital status: Divorced    Spouse name: Not on file   Number of children: 3   Years of education: College   Highest education level: Not on file  Occupational History   Occupation: Emergency planning/management officer  Tobacco Use   Smoking status: Former    Packs/day: 1.00    Years: 20.00    Pack years: 20.00    Types: Cigarettes    Quit date:  05/21/1991    Years since quitting: 29.4   Smokeless tobacco: Never  Vaping Use   Vaping Use: Never used  Substance and Sexual Activity   Alcohol use: No   Drug use: No   Sexual activity: Not on file  Other Topics Concern   Not on file  Social History Narrative   Patient is divorced with 3 children.   Patient is right handed.   Patient has a college education.   Patient drinks 1-2 servings daily.   Social Determinants of Health   Financial Resource Strain: Not on file  Food Insecurity: Not on file  Transportation Needs: Not on file  Physical Activity: Not on file  Stress: Not on file  Social Connections: Not on file     Family History:  The patient's family history includes Asthma in her father; Breast cancer (age of onset: 80) in her maternal grandmother; Colon polyps (age of onset: 68) in her sister; Emphysema in her father; Heart disease in her father and paternal grandfather. There is no history of Colon cancer.  ROS:   Please see the history of present illness.  All other systems are reviewed and otherwise negative.    EKGs/Labs/Other Studies Reviewed:    Studies reviewed are outlined and summarized above. Reports included below if pertinent.  2d echo 08/2019   1. Left ventricular ejection fraction, by estimation, is 65 to  70%. The  left ventricle has normal function. The left ventricle has no regional  wall motion abnormalities. Left ventricular diastolic parameters are  indeterminate.   2. Right ventricular systolic function is normal. The right ventricular  size is normal. There is normal pulmonary artery systolic pressure.   3. Left atrial size was mildly dilated.   4. The mitral valve is normal in structure. Trivial mitral valve  regurgitation. No evidence of mitral stenosis.   5. The aortic valve is normal in structure. Aortic valve regurgitation is  not visualized. No aortic stenosis is present.   6. The inferior vena cava is dilated in size with <50% respiratory  variability, suggesting right atrial pressure of 15 mmHg.   Monitor 10/2019 Predominant rhythm was sinus rhythm with average heart rate of 77 bpm Brief episodes of supraventricular tachycardia which appear to be paroxysmal atrial tachycardia not atrial fibrillation-longest episode was 10 seconds duration No pauses no ventricular tachycardia no excessive ventricular ectopy   Overall reassuring monitor with occasional brief episodes of paroxysmal atrial tachycardia.  No evidence of atrial fibrillation during monitoring as was diagnosed during prior hospitalization.  Even though we did not discover atrial fibrillation during this monitoring period, continue with anticoagulation, Eliquis.  I personally reviewed telemetry records from hospital and atrial fibrillation clearly was present during that time. Candee Furbish, MD     EKG:  EKG is not ordered today but reviewed from last hospitalization (sinus tach)  Recent Labs: 08/13/2020: ALT 14; B Natriuretic Peptide 135.2 08/16/2020: BUN 21; Creatinine, Ser 0.75; Hemoglobin 11.9; Magnesium 2.3; Platelets 223; Potassium 4.4; Sodium 141  Recent Lipid Panel No results found for: CHOL, TRIG, HDL, CHOLHDL, VLDL, LDLCALC, LDLDIRECT  PHYSICAL EXAM:    VS:  BP 110/60 (BP Location: Left Arm, Patient  Position: Sitting, Cuff Size: Normal)   Pulse 100   Ht 5' 2.5" (1.588 m)   Wt 212 lb (96.2 kg)   SpO2 97%   BMI 38.16 kg/m   BMI: Body mass index is 38.16 kg/m.  GEN: Well nourished, well developed female in no acute distress HEENT: normocephalic,  atraumatic Neck: no JVD, carotid bruits, or masses Cardiac: RRR; no murmurs, rubs, or gallops, mild soft pedal edema, diminished pedal pulses bilaterally Respiratory:  clear to auscultation bilaterally, normal work of breathing GI: soft, nontender, nondistended, + BS MS: no deformity or atrophy Skin: warm and dry, no rash Neuro:  Alert and Oriented x 3, Strength and sensation are intact, follows commands Psych: euthymic mood, full affect  Wt Readings from Last 3 Encounters:  11/01/20 212 lb (96.2 kg)  09/29/20 215 lb 12.8 oz (97.9 kg)  08/13/20 222 lb 7.1 oz (100.9 kg)     ASSESSMENT & PLAN:   Paroxysmal atrial fib, also h/o PAT - maintaining NSR on exam. Continue metoprolol and Eliquis. Will update CBC today given slight downtrend in Hgb by last check 07/2020.  Chronic diastolic CHF - recent Lasix titration from 40mg  daily to 80mg  BID. She has seen improvement of her weight back to the 206-207 range by home weight which is where she was when she began monitoring it earlier in May, prior to the weight gain. She still has mild edema on exam but this appears to be in the context of venous insufficiency as well. She is finishing out her 7 day course of higher dose Lasix. Will check BMET and TSH today as well to help guide further diuretic dosing. Suspect we can return back down to a lower dose but may require higher than the 40mg  she was on before. Reviewed 2g sodium restriction, 2L fluid restriction, daily weights with patient.  Essential HTN - controlled, no changes made today. Continue to follow with adjustment of Lasix.  Mild anemia by last labs - check CBC by labs today. No bleeding reported.  Aortic atherosclerosis - per discussion  with patient, lipids are followed by primary care - last checked 01/2020. She has a follow-up visit coming up later this summer with primary care. Would suggest aiming for a goal LDL <70.  Discoloration of lower extremities - she has chronic appearing violaceous discoloration of her lower legs and feet with varicose veins. Her toes have a purplish appearance to them. She has no classic claudication symptoms, but she does notice if she accidentally hits herself against the leg it will hurt longer than expected. No ulceration. Will arrange noninvasive lower extremity ABIs with reflex testing if needed.  OSA - she has not used CPAP in a long time due to prior intolerance. She felt last sleep study in 2018 was unreliable due to poor sleep that night. We discussed revisiting evaluation/treatment and she would like to proceed. Will arrange repeat sleep study and follow up with Dr. Radford Pax if this confirms persistent OSA.  Disposition: F/u with Dr. Marlou Porch in 6 months.   Medication Adjustments/Labs and Tests Ordered: Current medicines are reviewed at length with the patient today.  Concerns regarding medicines are outlined above. Medication changes, Labs and Tests ordered today are summarized above and listed in the Patient Instructions accessible in Encounters.   Signed, Charlie Pitter, PA-C  11/01/2020 2:27 PM    [facility] [address], [place], [place]  [address] Phone: [phone]; Fax: [phone]

## 2020-11-01 ENCOUNTER — Encounter: Payer: Self-pay | Admitting: Physician Assistant

## 2020-11-01 ENCOUNTER — Ambulatory Visit (INDEPENDENT_AMBULATORY_CARE_PROVIDER_SITE_OTHER): Payer: Medicare Other | Admitting: Physician Assistant

## 2020-11-01 VITALS — BP 110/60 | HR 100 | Ht 62.5 in | Wt 212.0 lb

## 2020-11-01 DIAGNOSIS — L819 Disorder of pigmentation, unspecified: Secondary | ICD-10-CM

## 2020-11-01 DIAGNOSIS — I471 Supraventricular tachycardia: Secondary | ICD-10-CM

## 2020-11-01 DIAGNOSIS — I480 Paroxysmal atrial fibrillation: Secondary | ICD-10-CM | POA: Diagnosis not present

## 2020-11-01 DIAGNOSIS — I700 Atherosclerosis of aorta: Secondary | ICD-10-CM | POA: Diagnosis not present

## 2020-11-01 DIAGNOSIS — G4733 Obstructive sleep apnea (adult) (pediatric): Secondary | ICD-10-CM | POA: Diagnosis not present

## 2020-11-01 DIAGNOSIS — I5032 Chronic diastolic (congestive) heart failure: Secondary | ICD-10-CM

## 2020-11-01 DIAGNOSIS — D649 Anemia, unspecified: Secondary | ICD-10-CM | POA: Diagnosis not present

## 2020-11-01 DIAGNOSIS — I10 Essential (primary) hypertension: Secondary | ICD-10-CM

## 2020-11-01 NOTE — Progress Notes (Signed)
  STOP BANG RISK ASSESSMENT S (snore) Have you been told that you snore?     YES  T (tired) Are you often tired, fatigued, or sleepy during the day?   YES  O (obstruction) Do you stop breathing, choke, or gasp during sleep? NO   P (pressure) Do you have or are you being treated for high blood pressure? YES   B (BMI) Is your body index greater than 35 kg/m? YES   A (age) Are you 76 years old or older? YES   N (neck) Do you have a neck circumference greater than 16 inches?   N/A   G (gender) Are you a female? NO   TOTAL STOP/BANG "YES" ANSWERS 5                                                                       For Office Use Only              Procedure Order Form    YES to 3+ Stop Bang questions OR two clinical symptoms - patient qualifies for WatchPAT (CPT 95800)             Clinical Notes: Will consult Sleep Specialist and refer for management of therapy due to patient increased risk of Sleep Apnea. Ordering a sleep study due to the following two clinical symptoms: Excessive daytime sleepiness G47.10 / Gastroesophageal reflux K21.9 / Nocturia R35.1 / Morning Headaches G44.221 / Difficulty concentrating R41.840 / Memory problems or poor judgment G31.84 / Personality changes or irritability R45.4 / Loud snoring R06.83 / Depression F32.9 / Unrefreshed by sleep G47.8 / Impotence N52.9 / History of high blood pressure R03.0 / Insomnia G47.00

## 2020-11-01 NOTE — Patient Instructions (Addendum)
Medication Instructions:  Your physician recommends that you continue on your current medications as directed. Please refer to the Current Medication list given to you today.  *If you need a refill on your cardiac medications before your next appointment, please call your pharmacy*   Lab Work: TODAY:  BMET, CBC, & TSH  If you have labs (blood work) drawn today and your tests are completely normal, you will receive your results only by: [place] (if you have MyChart) OR A paper copy in the mail If you have any lab test that is abnormal or we need to change your treatment, we will call you to review the results.   Testing/Procedures: Your physician has recommended that you have a sleep study. This test records several body functions during sleep, including: brain activity, eye movement, oxygen and carbon dioxide blood levels, heart rate and rhythm, breathing rate and rhythm, the flow of air through your mouth and nose, snoring, body muscle movements, and chest and belly movement.   Your physician has requested that you have a lower extremity arterial exercise duplex. During this test, exercise and ultrasound are used to evaluate arterial blood flow in the legs. Allow one hour for this exam. There are no restrictions or special instructions.  Follow-Up: At [facility], you and your health needs are our priority.  As part of our continuing mission to provide you with exceptional heart care, we have created designated Provider Care Teams.  These Care Teams include your primary Cardiologist (physician) and Advanced Practice Providers (APPs -  Physician Assistants and Nurse Practitioners) who all work together to provide you with the care you need, when you need it.  We recommend signing up for the patient portal called "MyChart".  Sign up information is provided on this After Visit Summary.  MyChart is used to connect with patients for Virtual Visits (Telemedicine).  Patients are able to  view lab/test results, encounter notes, upcoming appointments, etc.  Non-urgent messages can be sent to your provider as well.   To learn more about what you can do with MyChart, go to [email or web address].    Your next appointment:   6 month(s)  The format for your next appointment:   In Person  Provider:   You may see Candee Furbish, MD or one of the following Advanced Practice Providers on your designated Care Team:   Kathyrn Drown, NP   Other Instructions For patients with congestive heart failure, we give them these special instructions:  1. Follow a low-salt diet - you are allowed no more than 2,000mg  of sodium per day. Watch your fluid intake. In general, you should not be taking in more than 2 liters of fluid per day (close to 64 oz of fluid per day). This includes sources of water in foods like soup, coffee, tea, milk, etc. 2. Weigh yourself on the same scale at same time of day and keep a log. 3. Call your doctor: (Anytime you feel any of the following symptoms)  - 3lb weight gain overnight or 5lb within a few days - Shortness of breath, with or without a dry hacking cough  - Swelling in the hands, feet or stomach  - If you have to sleep on extra pillows at night in order to breathe   IT IS IMPORTANT TO LET YOUR DOCTOR KNOW EARLY ON IF YOU ARE HAVING SYMPTOMS SO WE CAN HELP YOU!

## 2020-11-02 ENCOUNTER — Telehealth: Payer: Self-pay | Admitting: *Deleted

## 2020-11-02 LAB — BASIC METABOLIC PANEL
BUN/Creatinine Ratio: 23 (ref 12–28)
BUN: 18 mg/dL (ref 8–27)
CO2: 26 mmol/L (ref 20–29)
Calcium: 9.6 mg/dL (ref 8.7–10.3)
Chloride: 98 mmol/L (ref 96–106)
Creatinine, Ser: 0.8 mg/dL (ref 0.57–1.00)
Glucose: 101 mg/dL — ABNORMAL HIGH (ref 65–99)
Potassium: 3.3 mmol/L — ABNORMAL LOW (ref 3.5–5.2)
Sodium: 144 mmol/L (ref 134–144)
eGFR: 76 mL/min/{1.73_m2} (ref >59)

## 2020-11-02 LAB — CBC
Hematocrit: 42.3 % (ref 34.0–46.6)
Hemoglobin: 14.2 g/dL (ref 11.1–15.9)
MCH: 27.7 pg (ref 26.6–33.0)
MCHC: 33.6 g/dL (ref 31.5–35.7)
MCV: 83 fL (ref 79–97)
Platelets: 208 10*3/uL (ref 150–450)
RBC: 5.13 x10E6/uL (ref 3.77–5.28)
RDW: 13.6 % (ref 11.7–15.4)
WBC: 7.4 10*3/uL (ref 3.4–10.8)

## 2020-11-02 LAB — TSH: TSH: 1.15 u[IU]/mL (ref 0.450–4.500)

## 2020-11-02 NOTE — Telephone Encounter (Signed)
I tried to reach the pt on her preferred # [phone], though recording states not in service, will confirm with the pt before removing this # from her chart. I left message on pt's cell # [phone] to call the office and ask to s/w Arbie Cookey with sleep studies. I will give pt PIN # once she calls back.

## 2020-11-03 NOTE — Telephone Encounter (Signed)
Left message x 3 for ptcb to go let her know she ok to proceed with sleep study and give her the PIN # to activate the study.

## 2020-11-06 ENCOUNTER — Other Ambulatory Visit: Payer: Self-pay | Admitting: Emergency Medicine

## 2020-11-06 ENCOUNTER — Other Ambulatory Visit: Payer: Self-pay | Admitting: Cardiology

## 2020-11-06 DIAGNOSIS — I5032 Chronic diastolic (congestive) heart failure: Secondary | ICD-10-CM | POA: Diagnosis not present

## 2020-11-06 DIAGNOSIS — I110 Hypertensive heart disease with heart failure: Secondary | ICD-10-CM | POA: Diagnosis not present

## 2020-11-06 DIAGNOSIS — I4891 Unspecified atrial fibrillation: Secondary | ICD-10-CM

## 2020-11-06 DIAGNOSIS — F329 Major depressive disorder, single episode, unspecified: Secondary | ICD-10-CM | POA: Diagnosis not present

## 2020-11-06 DIAGNOSIS — J441 Chronic obstructive pulmonary disease with (acute) exacerbation: Secondary | ICD-10-CM | POA: Diagnosis not present

## 2020-11-06 DIAGNOSIS — I480 Paroxysmal atrial fibrillation: Secondary | ICD-10-CM | POA: Diagnosis not present

## 2020-11-06 NOTE — Telephone Encounter (Signed)
Left message x 4 to please call the office in regards to [place] sleep study. Pt has been approved for sleep study and just needs to be given the PIN # in order to activate sleep study.

## 2020-11-09 ENCOUNTER — Telehealth: Payer: Self-pay | Admitting: Emergency Medicine

## 2020-11-09 DIAGNOSIS — J9611 Chronic respiratory failure with hypoxia: Secondary | ICD-10-CM

## 2020-11-09 NOTE — Telephone Encounter (Signed)
RB please advise if ok to send in an order for the POC.  She stated that the big POC that she has at home has 2 smaller canisters that she can fill and use when she goes out but she stated that these are too heavy for her to handle.  She is wanting to get the smaller POC to be able to handle this better when she is out.  She has reached out to her insurance and they will cover this and she has reached out to [facility] to make sure she will be able to get the POC.  They just need an order from [place]. Thanks

## 2020-11-09 NOTE — Telephone Encounter (Signed)
Pt is requesting a small portable oxygen concentrator and stated that she needs an order to be sent over to [facility]. Pls regard; [phone].  [facility] Fax number; [phone]

## 2020-11-13 ENCOUNTER — Encounter (INDEPENDENT_AMBULATORY_CARE_PROVIDER_SITE_OTHER): Payer: Medicare Other | Admitting: Cardiology

## 2020-11-13 DIAGNOSIS — I5032 Chronic diastolic (congestive) heart failure: Secondary | ICD-10-CM | POA: Diagnosis not present

## 2020-11-13 DIAGNOSIS — I480 Paroxysmal atrial fibrillation: Secondary | ICD-10-CM

## 2020-11-13 NOTE — Telephone Encounter (Signed)
Called patient. She verbalized understanding. She confirmed that she is on 2L of O2. Will update her chart.   Nothing further needed at time of call.

## 2020-11-13 NOTE — Telephone Encounter (Signed)
Yes ok to order, identical settings, flow rate as her current setup. Thanks.

## 2020-11-13 NOTE — Telephone Encounter (Signed)
I called pt's son (DPR) in regards to sleep study for his mom that I have been trying to reach her to give her the PIN # [id number]. I did confirm with the pt's son the # [phone] is not a valid # for his mother and he does not know that #. Pt then called his mother on a 3 way calling. I announced name and where I was calling from. I s/w the pt and stated that I was calling about her sleep study and that is good to go ahead and proceed with study either tonight or tomorrow night. Pt and he son aware of PIN # [phone]. Both aware once study has been read by the cardiologist we will call with the results of the sleep study. Pt thanked me for the call.

## 2020-11-14 ENCOUNTER — Telehealth: Payer: Self-pay | Admitting: Cardiology

## 2020-11-14 ENCOUNTER — Encounter: Payer: Self-pay | Admitting: *Deleted

## 2020-11-14 DIAGNOSIS — I5033 Acute on chronic diastolic (congestive) heart failure: Secondary | ICD-10-CM | POA: Diagnosis not present

## 2020-11-14 DIAGNOSIS — G43009 Migraine without aura, not intractable, without status migrainosus: Secondary | ICD-10-CM | POA: Diagnosis not present

## 2020-11-14 DIAGNOSIS — J441 Chronic obstructive pulmonary disease with (acute) exacerbation: Secondary | ICD-10-CM | POA: Diagnosis not present

## 2020-11-14 DIAGNOSIS — E785 Hyperlipidemia, unspecified: Secondary | ICD-10-CM | POA: Diagnosis not present

## 2020-11-14 DIAGNOSIS — I10 Essential (primary) hypertension: Secondary | ICD-10-CM | POA: Diagnosis not present

## 2020-11-14 DIAGNOSIS — F329 Major depressive disorder, single episode, unspecified: Secondary | ICD-10-CM | POA: Diagnosis not present

## 2020-11-14 DIAGNOSIS — D509 Iron deficiency anemia, unspecified: Secondary | ICD-10-CM | POA: Diagnosis not present

## 2020-11-14 DIAGNOSIS — J449 Chronic obstructive pulmonary disease, unspecified: Secondary | ICD-10-CM | POA: Diagnosis not present

## 2020-11-14 DIAGNOSIS — J4520 Mild intermittent asthma, uncomplicated: Secondary | ICD-10-CM | POA: Diagnosis not present

## 2020-11-14 DIAGNOSIS — I480 Paroxysmal atrial fibrillation: Secondary | ICD-10-CM | POA: Diagnosis not present

## 2020-11-14 DIAGNOSIS — K219 Gastro-esophageal reflux disease without esophagitis: Secondary | ICD-10-CM | POA: Diagnosis not present

## 2020-11-14 NOTE — Telephone Encounter (Signed)
Returned call to pt, left another message for pt to call back.  

## 2020-11-14 NOTE — Telephone Encounter (Signed)
PT is calling back about a [facility] message about results

## 2020-11-15 ENCOUNTER — Ambulatory Visit: Payer: Medicare Other

## 2020-11-15 DIAGNOSIS — I471 Supraventricular tachycardia: Secondary | ICD-10-CM

## 2020-11-15 DIAGNOSIS — L819 Disorder of pigmentation, unspecified: Secondary | ICD-10-CM

## 2020-11-15 DIAGNOSIS — I10 Essential (primary) hypertension: Secondary | ICD-10-CM

## 2020-11-15 DIAGNOSIS — D649 Anemia, unspecified: Secondary | ICD-10-CM

## 2020-11-15 DIAGNOSIS — I480 Paroxysmal atrial fibrillation: Secondary | ICD-10-CM

## 2020-11-15 DIAGNOSIS — I5032 Chronic diastolic (congestive) heart failure: Secondary | ICD-10-CM

## 2020-11-15 DIAGNOSIS — I700 Atherosclerosis of aorta: Secondary | ICD-10-CM

## 2020-11-15 NOTE — Procedures (Signed)
   Sleep Study Report  Patient Information Study Date: Nov 13, 2020 Patient Name: [name] Molina Patient ID: [id number] Birth Date: 1945-11-16 Age: 75 Gender: Female BMI: 38.5 (W=211 lb, H=5' 2'')  Referring Physician: Melina Copa, PA  TEST DESCRIPTION: Home sleep apnea testing was completed using the WatchPat, a Type 1 device, utilizing  peripheral arterial tonometry (PAT), chest movement, actigraphy, pulse oximetry, pulse rate, body position and snore.  AHI was calculated with apnea and hypopnea using valid sleep time as the denominator. RDI includes apneas,  hypopneas, and RERAs. The data acquired and the scoring of sleep and all associated events were performed in  accordance with the recommended standards and specifications as outlined in the AASM Manual for the Scoring of  Sleep and Associated Events 2.2.0 (2015).   FINDINGS: 1. No evidence of Obstructive Sleep Apnea with AHI 2.8/hr.  2. No Central Sleep Apnea. 3. Oxygen desaturations as low as 83%. 4. Mild snoring was present. O2 sats were < 88% for 0.2 minutes. 5. Total sleep time was 9 hrs and 8 min. 6. 15.9% of total sleep time was spent in REM sleep.  7. Prolonged sleep onset latency at 25 min.  8. Prolonged REM sleep onset latency at 210 min.  9. Total awakenings were 6.   DIAGNOSIS:  Normal study with no significant sleep disordered breathing.  RECOMMENDATIONS: 1. Normal study with no significant sleep disordered breathing.  2. Healthy sleep recommendations include: adequate nightly sleep (normal 7-9 hrs/night), avoidance of caffeine after  noon and alcohol near bedtime, and maintaining a sleep environment that is cool, dark and quiet.  3. Weight loss for overweight patients is recommended.   4. Snoring recommendations include: weight loss where appropriate, side sleeping, and avoidance of alcohol before  bed.  5. Operation of motor vehicle or dangerous equipment must be avoided when feeling drowsy, excessively  sleepy, or  mentally fatigued.   6. An ENT consultation which may be useful for specific causes of and possible treatment of bothersome snoring .   7. Weight loss may be of benefit in reducing the severity of snoring.   Signature: Electronically Signed: Nov 15, 2020 Fransico Him, MD; [facility]; [place], American Board of  Sleep Medicine Report prepared by: Fransico Him, MD

## 2020-11-16 ENCOUNTER — Emergency Department (HOSPITAL_COMMUNITY): Payer: Medicare Other

## 2020-11-16 ENCOUNTER — Other Ambulatory Visit: Payer: Self-pay

## 2020-11-16 ENCOUNTER — Observation Stay (HOSPITAL_BASED_OUTPATIENT_CLINIC_OR_DEPARTMENT_OTHER): Payer: Medicare Other

## 2020-11-16 ENCOUNTER — Observation Stay (HOSPITAL_COMMUNITY)
Admission: EM | Admit: 2020-11-16 | Discharge: 2020-11-17 | Disposition: A | Discharge status: Home / Self Care | Payer: Medicare Other | Attending: Internal Medicine | Admitting: Internal Medicine

## 2020-11-16 ENCOUNTER — Telehealth: Payer: Self-pay | Admitting: Student in an Organized Health Care Education/Training Program

## 2020-11-16 ENCOUNTER — Observation Stay (HOSPITAL_COMMUNITY): Payer: Medicare Other

## 2020-11-16 ENCOUNTER — Encounter (HOSPITAL_COMMUNITY): Payer: Self-pay | Admitting: Emergency Medicine

## 2020-11-16 DIAGNOSIS — J45909 Unspecified asthma, uncomplicated: Secondary | ICD-10-CM | POA: Insufficient documentation

## 2020-11-16 DIAGNOSIS — I5032 Chronic diastolic (congestive) heart failure: Secondary | ICD-10-CM

## 2020-11-16 DIAGNOSIS — J9611 Chronic respiratory failure with hypoxia: Secondary | ICD-10-CM | POA: Diagnosis present

## 2020-11-16 DIAGNOSIS — R000 Tachycardia, unspecified: Secondary | ICD-10-CM | POA: Diagnosis not present

## 2020-11-16 DIAGNOSIS — J449 Chronic obstructive pulmonary disease, unspecified: Secondary | ICD-10-CM | POA: Diagnosis present

## 2020-11-16 DIAGNOSIS — I4891 Unspecified atrial fibrillation: Secondary | ICD-10-CM

## 2020-11-16 DIAGNOSIS — I480 Paroxysmal atrial fibrillation: Secondary | ICD-10-CM | POA: Diagnosis not present

## 2020-11-16 DIAGNOSIS — R079 Chest pain, unspecified: Secondary | ICD-10-CM | POA: Diagnosis not present

## 2020-11-16 DIAGNOSIS — E876 Hypokalemia: Secondary | ICD-10-CM | POA: Diagnosis not present

## 2020-11-16 DIAGNOSIS — Z87891 Personal history of nicotine dependence: Secondary | ICD-10-CM | POA: Diagnosis not present

## 2020-11-16 DIAGNOSIS — Z85828 Personal history of other malignant neoplasm of skin: Secondary | ICD-10-CM | POA: Insufficient documentation

## 2020-11-16 DIAGNOSIS — R0602 Shortness of breath: Secondary | ICD-10-CM | POA: Diagnosis not present

## 2020-11-16 DIAGNOSIS — Z79899 Other long term (current) drug therapy: Secondary | ICD-10-CM | POA: Diagnosis not present

## 2020-11-16 DIAGNOSIS — Z7901 Long term (current) use of anticoagulants: Secondary | ICD-10-CM | POA: Insufficient documentation

## 2020-11-16 DIAGNOSIS — Z20822 Contact with and (suspected) exposure to covid-19: Secondary | ICD-10-CM | POA: Insufficient documentation

## 2020-11-16 DIAGNOSIS — I110 Hypertensive heart disease with heart failure: Secondary | ICD-10-CM | POA: Diagnosis not present

## 2020-11-16 DIAGNOSIS — R0789 Other chest pain: Secondary | ICD-10-CM | POA: Diagnosis not present

## 2020-11-16 DIAGNOSIS — R911 Solitary pulmonary nodule: Secondary | ICD-10-CM | POA: Diagnosis present

## 2020-11-16 DIAGNOSIS — I499 Cardiac arrhythmia, unspecified: Secondary | ICD-10-CM | POA: Diagnosis not present

## 2020-11-16 DIAGNOSIS — R002 Palpitations: Secondary | ICD-10-CM | POA: Diagnosis present

## 2020-11-16 DIAGNOSIS — F419 Anxiety disorder, unspecified: Secondary | ICD-10-CM | POA: Diagnosis present

## 2020-11-16 DIAGNOSIS — J9811 Atelectasis: Secondary | ICD-10-CM | POA: Diagnosis not present

## 2020-11-16 LAB — COMPREHENSIVE METABOLIC PANEL
ALT: 14 U/L (ref 0–44)
AST: 20 U/L (ref 15–41)
Albumin: 3.6 g/dL (ref 3.5–5.0)
Alkaline Phosphatase: 75 U/L (ref 38–126)
Anion gap: 12 (ref 5–15)
BUN: 18 mg/dL (ref 8–23)
CO2: 23 mmol/L (ref 22–32)
Calcium: 9.2 mg/dL (ref 8.9–10.3)
Chloride: 106 mmol/L (ref 98–111)
Creatinine, Ser: 0.74 mg/dL (ref 0.44–1.00)
GFR, Estimated: >60 mL/min (ref >60)
Glucose, Bld: 126 mg/dL — ABNORMAL HIGH (ref 70–99)
Potassium: 3.3 mmol/L — ABNORMAL LOW (ref 3.5–5.1)
Sodium: 141 mmol/L (ref 135–145)
Total Bilirubin: 0.7 mg/dL (ref 0.3–1.2)
Total Protein: 6.2 g/dL — ABNORMAL LOW (ref 6.5–8.1)

## 2020-11-16 LAB — CBC WITH DIFFERENTIAL/PLATELET
Abs Immature Granulocytes: 0.03 10*3/uL (ref 0.00–0.07)
Basophils Absolute: 0.1 10*3/uL (ref 0.0–0.1)
Basophils Relative: 1 %
Eosinophils Absolute: 0.2 10*3/uL (ref 0.0–0.5)
Eosinophils Relative: 3 %
HCT: 39.2 % (ref 36.0–46.0)
Hemoglobin: 12.5 g/dL (ref 12.0–15.0)
Immature Granulocytes: 1 %
Lymphocytes Relative: 21 %
Lymphs Abs: 1.2 10*3/uL (ref 0.7–4.0)
MCH: 27.5 pg (ref 26.0–34.0)
MCHC: 31.9 g/dL (ref 30.0–36.0)
MCV: 86.3 fL (ref 80.0–100.0)
Monocytes Absolute: 0.7 10*3/uL (ref 0.1–1.0)
Monocytes Relative: 13 %
Neutro Abs: 3.5 10*3/uL (ref 1.7–7.7)
Neutrophils Relative %: 61 %
Platelets: 242 10*3/uL (ref 150–400)
RBC: 4.54 MIL/uL (ref 3.87–5.11)
RDW: 14.6 % (ref 11.5–15.5)
WBC: 5.7 10*3/uL (ref 4.0–10.5)
nRBC: 0 % (ref 0.0–0.2)

## 2020-11-16 LAB — ECHOCARDIOGRAM COMPLETE
Area-P 1/2: 3.88 cm2
Calc EF: 69.4 %
Height: 62 in
MV VTI: 1.33 cm2
S' Lateral: 2 cm
Single Plane A2C EF: 66.4 %
Single Plane A4C EF: 73 %
Weight: 3425.07 oz

## 2020-11-16 LAB — I-STAT CHEM 8, ED
BUN: 20 mg/dL (ref 8–23)
Calcium, Ion: 1.15 mmol/L (ref 1.15–1.40)
Chloride: 106 mmol/L (ref 98–111)
Creatinine, Ser: 0.7 mg/dL (ref 0.44–1.00)
Glucose, Bld: 123 mg/dL — ABNORMAL HIGH (ref 70–99)
HCT: 36 % (ref 36.0–46.0)
Hemoglobin: 12.2 g/dL (ref 12.0–15.0)
Potassium: 3.2 mmol/L — ABNORMAL LOW (ref 3.5–5.1)
Sodium: 143 mmol/L (ref 135–145)
TCO2: 24 mmol/L (ref 22–32)

## 2020-11-16 LAB — PROTIME-INR
INR: 1.2 (ref 0.8–1.2)
Prothrombin Time: 14.9 seconds (ref 11.4–15.2)

## 2020-11-16 LAB — TROPONIN I (HIGH SENSITIVITY)
Troponin I (High Sensitivity): 12 ng/L (ref <18)
Troponin I (High Sensitivity): 13 ng/L (ref <18)

## 2020-11-16 LAB — RESP PANEL BY RT-PCR (FLU A&B, COVID) ARPGX2
Influenza A by PCR: NEGATIVE
Influenza B by PCR: NEGATIVE
SARS Coronavirus 2 by RT PCR: NEGATIVE

## 2020-11-16 LAB — MAGNESIUM: Magnesium: 1.8 mg/dL (ref 1.7–2.4)

## 2020-11-16 MED ORDER — DILTIAZEM HCL 60 MG PO TABS
90.0000 mg | ORAL_TABLET | Freq: Three times a day (TID) | ORAL | Status: DC
  Administered 2020-11-16 – 2020-11-17 (×3): 90 mg via ORAL
  Filled 2020-11-16 (×3): qty 1

## 2020-11-16 MED ORDER — IOHEXOL 350 MG/ML SOLN
50.0000 mL | Freq: Once | INTRAVENOUS | Status: AC | PRN
  Administered 2020-11-16: 50 mL via INTRAVENOUS

## 2020-11-16 MED ORDER — FLUTICASONE-UMECLIDIN-VILANT 100-62.5-25 MCG/INH IN AEPB: 1.0000 | INHALATION_SPRAY | Freq: Every day | RESPIRATORY_TRACT | Status: DC

## 2020-11-16 MED ORDER — DILTIAZEM HCL-DEXTROSE 125-5 MG/125ML-% IV SOLN (PREMIX)
5.0000 mg/h | INTRAVENOUS | Status: DC
  Administered 2020-11-16: 5 mg/h via INTRAVENOUS
  Filled 2020-11-16: qty 125

## 2020-11-16 MED ORDER — POTASSIUM CHLORIDE CRYS ER 20 MEQ PO TBCR
40.0000 meq | EXTENDED_RELEASE_TABLET | Freq: Once | ORAL | Status: AC
  Administered 2020-11-16: 40 meq via ORAL
  Filled 2020-11-16: qty 2

## 2020-11-16 MED ORDER — SODIUM CHLORIDE 0.9 % IV BOLUS
500.0000 mL | Freq: Once | INTRAVENOUS | Status: AC
  Administered 2020-11-16: 500 mL via INTRAVENOUS

## 2020-11-16 MED ORDER — ACETAMINOPHEN 325 MG PO TABS
650.0000 mg | ORAL_TABLET | ORAL | Status: DC | PRN
  Administered 2020-11-16 (×2): 650 mg via ORAL
  Filled 2020-11-16 (×2): qty 2

## 2020-11-16 MED ORDER — MONTELUKAST SODIUM 10 MG PO TABS
10.0000 mg | ORAL_TABLET | Freq: Every day | ORAL | Status: DC
  Administered 2020-11-16: 10 mg via ORAL
  Filled 2020-11-16: qty 1

## 2020-11-16 MED ORDER — DILTIAZEM HCL ER COATED BEADS 180 MG PO CP24: 300.0000 mg | ORAL_CAPSULE | Freq: Every day | ORAL | Status: DC

## 2020-11-16 MED ORDER — DILTIAZEM HCL 60 MG PO TABS: 90.0000 mg | ORAL_TABLET | Freq: Three times a day (TID) | ORAL | Status: DC

## 2020-11-16 MED ORDER — FLUTICASONE FUROATE-VILANTEROL 100-25 MCG/INH IN AEPB
1.0000 | INHALATION_SPRAY | Freq: Every day | RESPIRATORY_TRACT | Status: DC
  Administered 2020-11-16 – 2020-11-17 (×2): 1 via RESPIRATORY_TRACT
  Filled 2020-11-16: qty 28

## 2020-11-16 MED ORDER — APIXABAN 5 MG PO TABS
5.0000 mg | ORAL_TABLET | Freq: Two times a day (BID) | ORAL | Status: DC
  Administered 2020-11-16 – 2020-11-17 (×3): 5 mg via ORAL
  Filled 2020-11-16 (×3): qty 1

## 2020-11-16 MED ORDER — FUROSEMIDE 40 MG PO TABS
40.0000 mg | ORAL_TABLET | Freq: Every day | ORAL | Status: DC
  Administered 2020-11-16 – 2020-11-17 (×2): 40 mg via ORAL
  Filled 2020-11-16: qty 2
  Filled 2020-11-16: qty 1

## 2020-11-16 MED ORDER — UMECLIDINIUM BROMIDE 62.5 MCG/INH IN AEPB
1.0000 | INHALATION_SPRAY | Freq: Every day | RESPIRATORY_TRACT | Status: DC
  Administered 2020-11-16 – 2020-11-17 (×2): 1 via RESPIRATORY_TRACT
  Filled 2020-11-16: qty 7

## 2020-11-16 MED ORDER — ALBUTEROL SULFATE (2.5 MG/3ML) 0.083% IN NEBU: 2.5000 mg | INHALATION_SOLUTION | Freq: Four times a day (QID) | RESPIRATORY_TRACT | Status: DC | PRN

## 2020-11-16 MED ORDER — ONDANSETRON HCL 4 MG/2ML IJ SOLN: 4.0000 mg | Freq: Four times a day (QID) | INTRAMUSCULAR | Status: DC | PRN

## 2020-11-16 MED ORDER — PRAVASTATIN SODIUM 40 MG PO TABS
40.0000 mg | ORAL_TABLET | Freq: Every day | ORAL | Status: DC
  Administered 2020-11-16: 40 mg via ORAL
  Filled 2020-11-16: qty 1

## 2020-11-16 MED ORDER — DIGOXIN 125 MCG PO TABS: 0.1250 mg | ORAL_TABLET | Freq: Every day | ORAL | Status: DC

## 2020-11-16 MED ORDER — METOPROLOL SUCCINATE ER 50 MG PO TB24
50.0000 mg | ORAL_TABLET | Freq: Every day | ORAL | Status: DC
  Administered 2020-11-16: 50 mg via ORAL
  Filled 2020-11-16: qty 1

## 2020-11-16 NOTE — Consult Note (Addendum)
ELECTROPHYSIOLOGY CONSULT NOTE    Patient ID: DEDEE LISS MRN: [id number], DOB/AGE: 1946-01-25 75 y.o.  Admit date: 11/16/2020 Date of Consult: 11/16/2020  Primary Physician: Harlan Stains, MD Primary Cardiologist: Candee Furbish, MD  Electrophysiologist:  New  Referring Provider: Dr. Radford Pax  Patient Profile: [name] Molina is a 75 y.o. female with a history of COPD, chronic hypoxic respiratory failure, PAF on Eliquis, chronic diastolic CHF, and anxiety who is being seen today for the evaluation of AF with RVR at the request of Dr. Radford Pax.  HPI:  [name] Molina is a 75 y.o. female with medical history as above.   Last seen in office 11/01/20. Had weight loss on increased lasix.  Sent for sleep study.   Pt reports doing her USOH yesterday, was more active throughout the day. Upon lying down for bed she noted SOB, heaviness in her chest, and palpitations. HRs 150-170s at home. She called office and was recommended to proceed to [facility] for evaluation.   EKG on arrival shows AF at 138.  CXR without acute cardiopulmonary process (nodule noted) COVID negative.  K 3.2,  Cr 0.70, Mg 1.8, WBC 5.7, Hgb 12.2  She is feeling ok currently at rest. She felt somewhat fatigued all day yesterday but has had occasional palpitations throughout the past month. She typically doesn't feel it when she is out of rhythm. Occasionally feels like her heart is "shaking".  Denies exertional chest pain. She sleeps chronically in a recliner due to comfort. Recent sleep study without indication for CPAP  Past Medical History:  Diagnosis Date   Anxiety    Aortic atherosclerosis (HCC)    Arthritis    Asthma    Atrial tachycardia (HCC)    Cancer (HCC)    basal skin cancer on nose   Cataract    Chronic diastolic CHF (congestive heart failure) (HCC)    Complication of anesthesia    during cervical surgery she woke up during the procedure   COPD (chronic obstructive pulmonary disease) ([place])    Depression     Diverticulosis    Family history of adverse reaction to anesthesia    mother had post op N&V   GERD (gastroesophageal reflux disease)    History of hiatal hernia    Hyperlipidemia    Hypertension    Insomnia    Macular degeneration    Migraines    Neuropathy    Obesity    PAF (paroxysmal atrial fibrillation) (HCC)    Pneumonia    Sleep apnea    history of it,not using a cpap, study 2018: didn't need cpap   Thyroid nodule 2000   radioactive caspule     Surgical History:  Past Surgical History:  Procedure Laterality Date   ANTERIOR CERVICAL DECOMP/DISCECTOMY FUSION N/A 06/15/2020   Procedure: ANTERIOR CERVICAL DECOMPRESSION FUSION CERVICAL FIVE-CERVICAL SIX WITH INSTRUMENTATION AND ALLOGRAFT;  Surgeon: Phylliss Bob, MD;  Location: [place];  Service: Orthopedics;  Laterality: N/A;  anterior   CERVICAL POLYPECTOMY     INSIDE AND OUTSIDE   COLONOSCOPY     EYE SURGERY Bilateral    cataracts   POLYPECTOMY     REMOVED CARTLIAGE RIB       Medications Prior to Admission  Medication Sig Dispense Refill Last Dose   acetaminophen (TYLENOL) 650 MG CR tablet Take 1,300 mg by mouth every 8 (eight) hours as needed for pain.   11/15/2020   albuterol (PROVENTIL HFA;VENTOLIN HFA) 108 (90 Base) MCG/ACT inhaler Inhale 2 puffs into the  lungs every 6 (six) hours as needed for wheezing or shortness of breath. 1 Inhaler 6 Past Week   alclomethasone (ACLOVATE) 0.05 % cream Apply topically 2 (two) times daily as needed (Rash). 180 g 3 unk   aspirin EC 81 MG tablet Take 81 mg by mouth at bedtime. Swallow whole.   11/15/2020   BESIVANCE 0.6 % SUSP Place 1 drop into the left eye See admin instructions. Instill 1 drop qid day of eye injection and qid the day after.      cetirizine (ZYRTEC) 10 MG tablet Take 1 tablet (10 mg total) by mouth daily. 30 tablet 4 11/15/2020   Cyanocobalamin (VITAMIN B-12 PO) Take 2,500 mcg by mouth daily.   11/15/2020   DULoxetine (CYMBALTA) 60 MG capsule Take 60 mg by mouth every  morning.   11/15/2020   ELIQUIS 5 MG TABS tablet Take 5 mg by mouth 2 (two) times daily.   11/15/2020 at 2200   fluticasone (FLONASE) 50 MCG/ACT nasal spray Place 1 spray into both nostrils daily. (Patient taking differently: Place 1 spray into both nostrils daily as needed for allergies.) 16 g 5 unk   furosemide (LASIX) 40 MG tablet Take 2 tablets (80 mg total) by mouth daily. Take (2) tablets twice a day for 7 days then return to 40 mg a day (Patient taking differently: Take 40 mg by mouth daily.) 145 tablet 3 11/15/2020   ipratropium-albuterol (DUONEB) 0.5-2.5 (3) MG/3ML SOLN INHALE contents of one vial using nebulizer TWICE DAILY (Patient taking differently: Inhale 3 mLs into the lungs every 6 (six) hours as needed (wheezing, shortness of breath).) 180 mL 0 unk   LORazepam (ATIVAN) 0.5 MG tablet Take 0.5-1 mg by mouth daily as needed for anxiety. Anxiety   unk   metoprolol succinate (TOPROL-XL) 50 MG 24 hr tablet TAKE ONE TABLET BY MOUTH EVERYDAY AT BEDTIME (Patient taking differently: Take 50 mg by mouth at bedtime.) 90 tablet 2 11/15/2020 at 2200   montelukast (SINGULAIR) 10 MG tablet Take 10 mg by mouth at bedtime.   11/15/2020   Multiple Vitamins-Minerals (PRESERVISION AREDS 2+MULTI VIT) CAPS Take 1 tablet by mouth 2 (two) times daily.   11/15/2020   mupirocin ointment (BACTROBAN) 2 % Apply 1 application topically 2 (two) times daily. (Patient taking differently: Apply 1 application topically 2 (two) times daily as needed (skin infection).) 22 g 9 unk   omeprazole (PRILOSEC) 20 MG capsule Take 20 mg by mouth at bedtime.   11/15/2020   ondansetron (ZOFRAN) 4 MG tablet Take 1 tablet (4 mg total) by mouth every 8 (eight) hours as needed for nausea or vomiting. 20 tablet 0 unk   pravastatin (PRAVACHOL) 40 MG tablet Take 40 mg by mouth at bedtime.    11/15/2020   TRELEGY ELLIPTA 100-62.5-25 MCG/INH AEPB INHALE 1 PUFF BY MOUTH EVERY DAY (Patient taking differently: Inhale 1 puff into the lungs daily.) 60 each  3 11/15/2020    Inpatient Medications:   apixaban  5 mg Oral BID   fluticasone furoate-vilanterol  1 puff Inhalation Daily   And   umeclidinium bromide  1 puff Inhalation Daily   furosemide  40 mg Oral Daily   metoprolol succinate  50 mg Oral QHS   montelukast  10 mg Oral QHS   pravastatin  40 mg Oral QHS    Allergies:  Allergies  Allergen Reactions   Food Shortness Of Breath    ALLERGY= MUSHROOMS   Tequin Shortness Of Breath   Codeine Nausea And Vomiting  Erythromycin Other (See Comments)    GI UPSET   Levaquin [Levofloxacin In D5w] Other (See Comments)    "made me want to die."   Oxycodone-Acetaminophen Itching   Wellbutrin [Bupropion Hcl] Other (See Comments)    SHAKING   Penicillins Rash    Reaction: Childhood   Prednisone Rash    Social History   Socioeconomic History   Marital status: Divorced    Spouse name: Not on file   Number of children: 3   Years of education: College   Highest education level: Not on file  Occupational History   Occupation: Emergency planning/management officer  Tobacco Use   Smoking status: Former    Packs/day: 1.00    Years: 20.00    Pack years: 20.00    Types: Cigarettes    Quit date: 05/21/1991    Years since quitting: 29.5   Smokeless tobacco: Never  Vaping Use   Vaping Use: Never used  Substance and Sexual Activity   Alcohol use: No   Drug use: No   Sexual activity: Not on file  Other Topics Concern   Not on file  Social History Narrative   Patient is divorced with 3 children.   Patient is right handed.   Patient has a college education.   Patient drinks 1-2 servings daily.   Social Determinants of Health   Financial Resource Strain: Not on file  Food Insecurity: Not on file  Transportation Needs: Not on file  Physical Activity: Not on file  Stress: Not on file  Social Connections: Not on file  Intimate Partner Violence: Not on file     Family History  Problem Relation Age of Onset   Colon polyps Sister 6        PRECANCEROUS POLYPS   Breast cancer Maternal Grandmother 40   Emphysema Father    Asthma Father    Heart disease Father    Heart disease Paternal Grandfather    Colon cancer Neg Hx      Review of Systems: All other systems reviewed and are otherwise negative except as noted above.  Physical Exam: Vitals:   11/16/20 0830 11/16/20 0900 11/16/20 1041 11/16/20 1049  BP: 99/70 (!) 113/56 (!) 89/73 113/77  Pulse: 99 (!) 101 (!) 124 99  Resp: 18 (!) 25  20  Temp:    97.9 F (36.6 C)  TempSrc:    Oral  SpO2: 94% 94% 96% 93%  Weight:    97.1 kg  Height:    5\' 2"  (1.575 m)    GEN- The patient is well appearing, alert and oriented x 3 today.   HEENT: normocephalic, atraumatic; sclera clear, conjunctiva pink; hearing intact; oropharynx clear; neck supple Lungs- Clear to ausculation bilaterally, normal work of breathing.  No wheezes, rales, rhonchi Heart- Rapid and irregularly irregular rate and rhythm, no murmurs, rubs or gallops GI- soft, non-tender, non-distended, bowel sounds present Extremities- no clubbing, cyanosis, or edema; DP/PT/radial pulses 2+ bilaterally MS- no significant deformity or atrophy Skin- warm and dry, no rash or lesion Psych- euthymic mood, full affect Neuro- strength and sensation are intact  Labs:   Lab Results  Component Value Date   WBC 5.7 11/16/2020   HGB 12.2 11/16/2020   HCT 36.0 11/16/2020   MCV 86.3 11/16/2020   PLT 242 11/16/2020    Recent Labs  Lab 11/16/20 0356 11/16/20 0406  NA 141 143  K 3.3* 3.2*  CL 106 106  CO2 23  --   BUN 18 20  CREATININE  0.74 0.70  CALCIUM 9.2  --   PROT 6.2*  --   BILITOT 0.7  --   ALKPHOS 75  --   ALT 14  --   AST 20  --   GLUCOSE 126* 123*      Radiology/Studies: CT Angio Chest PE W and/or Wo Contrast  Result Date: 11/16/2020 CLINICAL DATA:  75 year old female with chest pain, shortness of breath. Atrial fibrillation with RVR. EXAM: CT ANGIOGRAPHY CHEST WITH CONTRAST TECHNIQUE: Multidetector CT  imaging of the chest was performed using the standard protocol during bolus administration of intravenous contrast. Multiplanar CT image reconstructions and MIPs were obtained to evaluate the vascular anatomy. CONTRAST:  57mL OMNIPAQUE IOHEXOL 350 MG/ML SOLN COMPARISON:  Chest CTA 11/26/2017.  Portable chest 0401 hours today. Thyroid ultrasound and biopsy 08/04/2003. FINDINGS: Cardiovascular: Good contrast bolus timing in the pulmonary arterial tree. Mild respiratory motion. No focal filling defect identified in the pulmonary arteries to suggest acute pulmonary embolism. No cardiomegaly or pericardial effusion. Calcified aortic atherosclerosis. Little contrast in the aorta today. Faint calcified coronary artery atherosclerosis on series 7, image 224. Mediastinum/Nodes: Chronic thyromegaly This has been evaluated on previous imaging. (ref: J Am Coll Radiol. 2015 Feb;12(2): 143-50).Bilateral mediastinal lymph nodes have enlarged since 2019, now up to 13 mm in the AP window, 15 mm precarinal (previously subcentimeter). Similar mild enlargement of right hilar nodes. Lungs/Pleura: Major airways are patent. Similar lung volumes to 2019. Chronic bilateral lower lobe curvilinear scarring and atelectasis has not significantly changed. No pleural effusion. Mosaic attenuation elsewhere in the lungs likely due to gas trapping. No consolidation or inflammatory pulmonary opacity identified. No pleural effusion. The small 5 mm posterior right upper lobe nodule seen in 2019 does not persist. No pulmonary nodule identified today. Upper Abdomen: Negative visible liver, gallbladder, spleen, pancreas, adrenal glands, kidneys, and bowel. Musculoskeletal: Partially visible cervical ACDF. No acute or suspicious osseous lesion identified. Review of the MIP images confirms the above findings. IMPRESSION: 1. No evidence of acute pulmonary embolus. 2. Mild nonspecific mediastinal lymphadenopathy, new since 2019. The affected lymph nodes  appear reactive, but no corresponding pulmonary infectious or inflammatory process is identified. A lymphoproliferative disorder cannot be excluded. 3. Chronic lower lobe lung scarring and mild pulmonary gas trapping. A small 5 mm right upper lobe nodule seen in 2019 has resolved. 4. Aortic Atherosclerosis (ICD10-I70.0). Electronically Signed   By: Genevie Ann M.D.   On: 11/16/2020 05:52   DG Chest Portable 1 View  Result Date: 11/16/2020 CLINICAL DATA:  Atrial fibrillation EXAM: PORTABLE CHEST 1 VIEW COMPARISON:  08/13/2020 FINDINGS: 15 mm nodular opacity is seen at the right lung base, new from prior examination. Minimal left basilar atelectasis or scarring. No superimposed confluent pulmonary infiltrate. No pneumothorax or pleural effusion. Cardiac size within normal limits. Pulmonary vascularity is normal. No acute bone abnormality. IMPRESSION: No radiographic evidence of acute cardiopulmonary disease. New 15 mm nodular opacity at the right lung base. This would be better assessed with a nonemergent standard two view chest radiograph once the patient's acute issues have resolved. Electronically Signed   By: Fidela Salisbury MD   On: 11/16/2020 04:13    EKG:on arrival shows atrial fibrillation at 106 bpm (personally reviewed)  TELEMETRY: AF with RVR 100-140s (personally reviewed)  Baseline EKG 02/2020 shows NSR (artifact) and QTc ~ 440 at baseline  Assessment/Plan: 1.  Paroxysmal atrial fibrillation with RVR Missed dose of Eliquis 5 mg BID last week CHA2DS2/VASc is at least 5.  Continue diltiazem for rate  control. As rates improve need to attempt to convert to po Avin Gibbons add po and attempt to get off gtt.  Consider digoxin  Keep K > 4.0 and Mg > 2.0  Poor candidate for amio with neuropathy and COPD Would be a good candidate for flecainide or tikosyn once adequately covered with [place] or s/p TEE. Would lean towards flecainide to start.   2. Chronic diastolic CHF Echo 09/3910 LVEF 65-70%   3.  Hypokalemia Supp ordered. Follow  If HRs can be controlled, would plan for home on rate control and follow up as outpatient for discussion of AAD and timing of [facility] (next available with TEE vs waiting for 3 weeks of uninterrupted). Stressed importance of not missing [place].   If HRs cannot be controlled, Marvis Bakken need to stay for TEE/DCC early next week.   For questions or updates, please contact [place] Please consult [email or web address] for contact info under Cardiology/STEMI.  Signed, Shirley Friar, PA-C  11/16/2020 12:54 PM   I have seen and examined this patient with Oda Kilts.  Agree with above, note added to reflect my findings.  On exam, tachycardic, no murmurs.  Patient presented to the hospital with rapid atrial fibrillation.  She was put on a diltiazem drip.  She is currently feeling improved.  We are starting her on diltiazem 90 mg 3 times daily.  Would titrate her diltiazem for improved rate control.  Goal heart rates in the 90s at rest.  She is feeling much improved with better rate control.  We Lashann Hagg arrange for follow-up in clinic to discuss antiarrhythmics and cardioversion.  She would potentially be a good candidate for flecainide.  Deborha Moseley M. Peniel Hass MD 11/16/2020 2:36 PM

## 2020-11-16 NOTE — ED Notes (Signed)
Cardiology at bedside.

## 2020-11-16 NOTE — Telephone Encounter (Signed)
Paged by operator that patient was having recurrent AF based off [place]. Called [phone]) and reached her daughter [name] Molina who is with her. HR 150-170s. SOB and chest heaviness along with palpitations. Mild diaphoresis and HA. Patient was awake and felt heaviness on chest. Given AF/RVR and sx she needs to be evaluated in the ED. She doesn't have any prn options prescribed.   Daughter would feel most comfortable with EMS coming to assess her and bring her to ED for further eval. She will call EMS now.

## 2020-11-16 NOTE — Plan of Care (Signed)
  Problem: Education: Goal: Knowledge of General Education information will improve Description: Including pain rating scale, medication(s)/side effects and non-pharmacologic comfort measures 11/16/2020 2242 by Mollie Germany, RN Outcome: Progressing 11/16/2020 2240 by Mollie Germany, RN Outcome: Progressing 11/16/2020 2240 by Mollie Germany, RN Outcome: Progressing   Problem: Health Behavior/Discharge Planning: Goal: Ability to manage health-related needs will improve 11/16/2020 2242 by Mollie Germany, RN Outcome: Progressing 11/16/2020 2240 by Mollie Germany, RN Outcome: Progressing 11/16/2020 2240 by Mollie Germany, RN Outcome: Progressing   Problem: Clinical Measurements: Goal: Ability to maintain clinical measurements within normal limits will improve Outcome: Progressing

## 2020-11-16 NOTE — Progress Notes (Signed)
PROGRESS NOTE    [name] Molina  FUX:[id number] DOB: 1945/07/21 DOA: 11/16/2020 PCP: Harlan Stains, MD    Brief Narrative:  [phone] with hx COPD, paroxysmal afib on eliquis, diastolic CHF presenting with sob and chest pressure, found to have afib RVR. Cardiology was consulted  Assessment & Plan:   Principal Problem:   Atrial fibrillation with RVR ([facility]) Active Problems:   COPD (chronic obstructive pulmonary disease) (HCC)   Pulmonary nodule   Paroxysmal atrial fibrillation (HCC)   Chronic diastolic CHF (congestive heart failure) (HCC)   Chronic respiratory failure with hypoxia (HCC)   Anxiety   Hypokalemia  1. Atrial fibrillation with RVR  - Presents with chest pressure, SOB, and rapid atrial fibrillation with rate 150-170s on home monitor - was continued on cardizem gtt at time of presentation - Continued on Eliquis, continue metoprolol -appreciate input by Cardiology. Recommendation for continuing to control HR with plan for TEE in 3 weeks but if HR cannot be controlled, will need to stay for TEE/DOC early next week   2. Chest pain  - Presents with acute onset chest pressure and dyspnea in setting of rapid atrial fibrillation - Treated with ASA 325 mg by EMS pta  - Initial troponin normal; CTA negative for PE  - Discomfort resolved with rate-control  - Cont with rate control per above   3. Chronic diastolic CHF  - Appears compensated, EF was preserved in April 2021  - Continue Lasix  as tolerated   4. COPD; chronic hypoxic respiratory failure  - Appears stable   - Continue bronchodilator as needed, supplemental O2   5. Hypokalemia  - Replaced -Repeat lytes in AM   DVT prophylaxis: Eliquis Code Status: DNR Family Communication: Pt in room, family not at bedside  Status is: Observation  The patient remains OBS appropriate and will d/c before 2 midnights.  Dispo: The patient is from: Home              Anticipated d/c is to: Home              Patient currently is  not medically stable to d/c.   Difficult to place patient No       Consultants:  Cardiology   Procedures:    Antimicrobials: Anti-infectives (From admission, onward)    None       Subjective: Reports feeling better today  Objective: Vitals:   11/16/20 0830 11/16/20 0900 11/16/20 1041 11/16/20 1049  BP: 99/70 (!) 113/56 (!) 89/73 113/77  Pulse: 99 (!) 101 (!) 124 99  Resp: 18 (!) 25  20  Temp:    97.9 F (36.6 C)  TempSrc:    Oral  SpO2: 94% 94% 96% 93%  Weight:    97.1 kg  Height:    5\' 2"  (1.575 m)    Intake/Output Summary (Last 24 hours) at 11/16/2020 1449 Last data filed at 11/16/2020 1226 Gross per 24 hour  Intake 740 ml  Output 600 ml  Net 140 ml   Filed Weights   11/16/20 1049  Weight: 97.1 kg    Examination: General exam: Awake, laying in bed, in nad Respiratory system: Normal respiratory effort, no wheezing Cardiovascular system: regular rate, s1, s2 Gastrointestinal system: Soft, nondistended, positive BS Central nervous system: CN2-12 grossly intact, strength intact Extremities: Perfused, no clubbing Skin: Normal skin turgor, no notable skin lesions seen Psychiatry: Mood normal // no visual hallucinations   Data Reviewed: I have personally reviewed following labs and imaging studies  CBC: Recent  Labs  Lab 11/16/20 0356 11/16/20 0406  WBC 5.7  --   NEUTROABS 3.5  --   HGB 12.5 12.2  HCT 39.2 36.0  MCV 86.3  --   PLT 242  --    Basic Metabolic Panel: Recent Labs  Lab 11/16/20 0356 11/16/20 0406 11/16/20 0642  NA 141 143  --   K 3.3* 3.2*  --   CL 106 106  --   CO2 23  --   --   GLUCOSE 126* 123*  --   BUN 18 20  --   CREATININE 0.74 0.70  --   CALCIUM 9.2  --   --   MG  --   --  1.8   GFR: Estimated Creatinine Clearance: 65.1 mL/min (by C-G formula based on SCr of 0.7 mg/dL). Liver Function Tests: Recent Labs  Lab 11/16/20 0356  AST 20  ALT 14  ALKPHOS 75  BILITOT 0.7  PROT 6.2*  ALBUMIN 3.6   No results for  input(s): LIPASE, AMYLASE in the last 168 hours. No results for input(s): AMMONIA in the last 168 hours. Coagulation Profile: Recent Labs  Lab 11/16/20 0356  INR 1.2   Cardiac Enzymes: No results for input(s): CKTOTAL, CKMB, CKMBINDEX, TROPONINI in the last 168 hours. BNP (last 3 results) No results for input(s): PROBNP in the last 8760 hours. HbA1C: No results for input(s): HGBA1C in the last 72 hours. CBG: No results for input(s): GLUCAP in the last 168 hours. Lipid Profile: No results for input(s): CHOL, HDL, LDLCALC, TRIG, CHOLHDL, LDLDIRECT in the last 72 hours. Thyroid Function Tests: No results for input(s): TSH, T4TOTAL, FREET4, T3FREE, THYROIDAB in the last 72 hours. Anemia Panel: No results for input(s): VITAMINB12, FOLATE, FERRITIN, TIBC, IRON, RETICCTPCT in the last 72 hours. Sepsis Labs: No results for input(s): PROCALCITON, LATICACIDVEN in the last 168 hours.  Recent Results (from the past 240 hour(s))  Resp Panel by RT-PCR (Flu A&B, Covid) Nasopharyngeal Swab     Status: None   Collection Time: 11/16/20  4:06 AM   Specimen: Nasopharyngeal Swab; Nasopharyngeal(NP) swabs in vial transport medium  Result Value Ref Range Status   SARS Coronavirus 2 by RT PCR NEGATIVE NEGATIVE Final    Comment: (NOTE) SARS-CoV-2 target nucleic acids are NOT DETECTED.  The SARS-CoV-2 RNA is generally detectable in upper respiratory specimens during the acute phase of infection. The lowest concentration of SARS-CoV-2 viral copies this assay can detect is 138 copies/mL. A negative result does not preclude SARS-Cov-2 infection and should not be used as the sole basis for treatment or other patient management decisions. A negative result may occur with  improper specimen collection/handling, submission of specimen other than nasopharyngeal swab, presence of viral mutation(s) within the areas targeted by this assay, and inadequate number of viral copies(<138 copies/mL). A negative  result must be combined with clinical observations, patient history, and epidemiological information. The expected result is Negative.  Fact Sheet for Patients:  EntrepreneurPulse.com.au  Fact Sheet for Healthcare Providers:  IncredibleEmployment.be  This test is no t yet approved or cleared by the [place] FDA and  has been authorized for detection and/or diagnosis of SARS-CoV-2 by FDA under an Emergency Use Authorization (EUA). This EUA will remain  in effect (meaning this test can be used) for the duration of the COVID-19 declaration under Section 564(b)(1) of the Act, 21 U.S.C.section 360bbb-3(b)(1), unless the authorization is terminated  or revoked sooner.       Influenza A by PCR NEGATIVE NEGATIVE  Final   Influenza B by PCR NEGATIVE NEGATIVE Final    Comment: (NOTE) The Xpert Xpress SARS-CoV-2/FLU/RSV plus assay is intended as an aid in the diagnosis of influenza from Nasopharyngeal swab specimens and should not be used as a sole basis for treatment. Nasal washings and aspirates are unacceptable for Xpert Xpress SARS-CoV-2/FLU/RSV testing.  Fact Sheet for Patients: EntrepreneurPulse.com.au  Fact Sheet for Healthcare Providers: IncredibleEmployment.be  This test is not yet approved or cleared by the [place] FDA and has been authorized for detection and/or diagnosis of SARS-CoV-2 by FDA under an Emergency Use Authorization (EUA). This EUA will remain in effect (meaning this test can be used) for the duration of the COVID-19 declaration under Section 564(b)(1) of the Act, 21 U.S.C. section 360bbb-3(b)(1), unless the authorization is terminated or revoked.  Performed at [facility], [place] [address], [place], [place] [address]      Radiology Studies: CT Angio Chest PE W and/or Wo Contrast  Result Date: 11/16/2020 CLINICAL DATA:  75 year old female with chest pain, shortness of  breath. Atrial fibrillation with RVR. EXAM: CT ANGIOGRAPHY CHEST WITH CONTRAST TECHNIQUE: Multidetector CT imaging of the chest was performed using the standard protocol during bolus administration of intravenous contrast. Multiplanar CT image reconstructions and MIPs were obtained to evaluate the vascular anatomy. CONTRAST:  33mL OMNIPAQUE IOHEXOL 350 MG/ML SOLN COMPARISON:  Chest CTA 11/26/2017.  Portable chest 0401 hours today. Thyroid ultrasound and biopsy 08/04/2003. FINDINGS: Cardiovascular: Good contrast bolus timing in the pulmonary arterial tree. Mild respiratory motion. No focal filling defect identified in the pulmonary arteries to suggest acute pulmonary embolism. No cardiomegaly or pericardial effusion. Calcified aortic atherosclerosis. Little contrast in the aorta today. Faint calcified coronary artery atherosclerosis on series 7, image 224. Mediastinum/Nodes: Chronic thyromegaly This has been evaluated on previous imaging. (ref: J Am Coll Radiol. 2015 Feb;12(2): 143-50).Bilateral mediastinal lymph nodes have enlarged since 2019, now up to 13 mm in the AP window, 15 mm precarinal (previously subcentimeter). Similar mild enlargement of right hilar nodes. Lungs/Pleura: Major airways are patent. Similar lung volumes to 2019. Chronic bilateral lower lobe curvilinear scarring and atelectasis has not significantly changed. No pleural effusion. Mosaic attenuation elsewhere in the lungs likely due to gas trapping. No consolidation or inflammatory pulmonary opacity identified. No pleural effusion. The small 5 mm posterior right upper lobe nodule seen in 2019 does not persist. No pulmonary nodule identified today. Upper Abdomen: Negative visible liver, gallbladder, spleen, pancreas, adrenal glands, kidneys, and bowel. Musculoskeletal: Partially visible cervical ACDF. No acute or suspicious osseous lesion identified. Review of the MIP images confirms the above findings. IMPRESSION: 1. No evidence of acute  pulmonary embolus. 2. Mild nonspecific mediastinal lymphadenopathy, new since 2019. The affected lymph nodes appear reactive, but no corresponding pulmonary infectious or inflammatory process is identified. A lymphoproliferative disorder cannot be excluded. 3. Chronic lower lobe lung scarring and mild pulmonary gas trapping. A small 5 mm right upper lobe nodule seen in 2019 has resolved. 4. Aortic Atherosclerosis (ICD10-I70.0). Electronically Signed   By: Genevie Ann M.D.   On: 11/16/2020 05:52   DG Chest Portable 1 View  Result Date: 11/16/2020 CLINICAL DATA:  Atrial fibrillation EXAM: PORTABLE CHEST 1 VIEW COMPARISON:  08/13/2020 FINDINGS: 15 mm nodular opacity is seen at the right lung base, new from prior examination. Minimal left basilar atelectasis or scarring. No superimposed confluent pulmonary infiltrate. No pneumothorax or pleural effusion. Cardiac size within normal limits. Pulmonary vascularity is normal. No acute bone abnormality. IMPRESSION: No radiographic  evidence of acute cardiopulmonary disease. New 15 mm nodular opacity at the right lung base. This would be better assessed with a nonemergent standard two view chest radiograph once the patient's acute issues have resolved. Electronically Signed   By: Fidela Salisbury MD   On: 11/16/2020 04:13    Scheduled Meds:  apixaban  5 mg Oral BID   diltiazem  90 mg Oral Q8H   fluticasone furoate-vilanterol  1 puff Inhalation Daily   And   umeclidinium bromide  1 puff Inhalation Daily   furosemide  40 mg Oral Daily   metoprolol succinate  50 mg Oral QHS   montelukast  10 mg Oral QHS   pravastatin  40 mg Oral QHS   Continuous Infusions:  diltiazem (CARDIZEM) infusion 5 mg/hr (11/16/20 0809)     LOS: 0 days   Marylu Lund, MD [facility] Pager On Amion  If 7PM-7AM, please contact night-coverage 11/16/2020, 2:49 PM

## 2020-11-16 NOTE — Consult Note (Signed)
Cardiology Consultation:   Patient ID: [name] Molina MRN: [id number]; DOB: Sep 28, 1945  Admit date: 11/16/2020 Date of Consult: 11/16/2020  Primary Care Provider: Harlan Stains, MD [facility] Cardiologist: Candee Furbish, MD  [facility] Electrophysiologist:  None   Patient Profile:   [name] Molina is a 75 y.o. female with pAF, HFpEF, HLD, HTN, peripheral neuropathy, spinal stenosis, COPD and MDD who presents with AF/RVR.   History of Present Illness:   Ms. Hottinger daughter called the triage line last night for guidance of what she thought was recurrent AF/RVR.  She has a Kardia mobile device which recorded a heart rate of 160-170 in AF/RVR late last night.  She was having a mild amount of chest pressure and palpitations but otherwise was relatively asymptomatic.  She had no prn IR medications currently prescribed for management of intermittent AF/RVR so asked her to be evaluated in the ED.  She was initially diagnosed with AF in April 2021 when she was hospitalized and was started on metoprolol along with diltiazem and Eliquis at that time.  In follow-up her metoprolol was increased, diltiazem was continued, and her hypertensive meds were titrated.  Ultimately she was kept on monotherapy diltiazem for period of time however her heart rate seem to be better controlled on metoprolol so Dr. Marlou Porch discontinued her diltiazem and resumed metoprolol at that time.  She had a Zio patch placed to evaluate her AF burden which only recorded brief episodes of SVT thought to be paroxysmal atrial tachycardia instead of atrial fibrillation.  She has never required cardioversion and is never undergone catheter ablation for her prior diagnosis of AF.  Last night she was in her recliner and getting ready to go to bed when she developed some very subtle chest pressure.  She has had this a few times before and so she thought to check her Kardia device to see if her heart rate was abnormal.  She had taken off  her nighttime medications including long-acting metoprolol and Eliquis.  Past Medical History:  Diagnosis Date   Anxiety    Aortic atherosclerosis (HCC)    Arthritis    Asthma    Atrial tachycardia ([place])    Cancer (HCC)    basal skin cancer on nose   Cataract    Chronic diastolic CHF (congestive heart failure) (HCC)    Complication of anesthesia    during cervical surgery she woke up during the procedure   COPD (chronic obstructive pulmonary disease) ([place])    Depression    Diverticulosis    Family history of adverse reaction to anesthesia    mother had post op N&V   GERD (gastroesophageal reflux disease)    History of hiatal hernia    Hyperlipidemia    Hypertension    Insomnia    Macular degeneration    Migraines    Neuropathy    Obesity    PAF (paroxysmal atrial fibrillation) ([place])    Pneumonia    Sleep apnea    history of it,not using a cpap, study 2018: didn't need cpap   Thyroid nodule 2000   radioactive caspule    Past Surgical History:  Procedure Laterality Date   ANTERIOR CERVICAL DECOMP/DISCECTOMY FUSION N/A 06/15/2020   Procedure: ANTERIOR CERVICAL DECOMPRESSION FUSION CERVICAL FIVE-CERVICAL SIX WITH INSTRUMENTATION AND ALLOGRAFT;  Surgeon: Phylliss Bob, MD;  Location: [facility];  Service: Orthopedics;  Laterality: N/A;  anterior   CERVICAL POLYPECTOMY     INSIDE AND OUTSIDE   COLONOSCOPY  EYE SURGERY Bilateral    cataracts   POLYPECTOMY     REMOVED CARTLIAGE RIB      Home Medications:  Prior to Admission medications   Medication Sig Start Date End Date Taking? Authorizing Provider  acetaminophen (TYLENOL) 650 MG CR tablet Take 1,300 mg by mouth every 8 (eight) hours as needed for pain.   Yes [provider]  albuterol (PROVENTIL HFA;VENTOLIN HFA) 108 (90 Base) MCG/ACT inhaler Inhale 2 puffs into the lungs every 6 (six) hours as needed for wheezing or shortness of breath. 03/05/18  Yes Collene Gobble, MD  alclomethasone (ACLOVATE) 0.05 % cream  Apply topically 2 (two) times daily as needed (Rash). 10/03/20  Yes Sheffield, Kelli R, PA-C  BESIVANCE 0.6 % SUSP Place 1 drop into the left eye See admin instructions. Instill 1 drop qid day of eye injection and qid the day after. 09/07/19  Yes [provider]  cetirizine (ZYRTEC) 10 MG tablet Take 1 tablet (10 mg total) by mouth daily. 10/02/20  Yes Collene Gobble, MD  Cyanocobalamin (VITAMIN B-12 PO) Take 2,500 mcg by mouth daily.   Yes [provider]  DULoxetine (CYMBALTA) 60 MG capsule Take 60 mg by mouth every morning. 03/23/20  Yes [provider]  ELIQUIS 5 MG TABS tablet Take 5 mg by mouth 2 (two) times daily. 07/20/20  Yes [provider]  fluticasone (FLONASE) 50 MCG/ACT nasal spray Place 1 spray into both nostrils daily. Patient taking differently: Place 1 spray into both nostrils daily as needed for allergies. 07/19/16  Yes Collene Gobble, MD  furosemide (LASIX) 40 MG tablet Take 2 tablets (80 mg total) by mouth daily. Take (2) tablets twice a day for 7 days then return to 40 mg a day Patient taking differently: Take 40 mg by mouth daily. 10/23/20  Yes Jerline Pain, MD  ipratropium-albuterol (DUONEB) 0.5-2.5 (3) MG/3ML SOLN INHALE contents of one vial using nebulizer TWICE DAILY Patient taking differently: Inhale 3 mLs into the lungs every 6 (six) hours as needed (wheezing, shortness of breath). 11/06/20  Yes Collene Gobble, MD  Multiple Vitamins-Minerals (PRESERVISION AREDS 2+MULTI VIT) CAPS Take 1 tablet by mouth 2 (two) times daily.   Yes [provider]  LORazepam (ATIVAN) 0.5 MG tablet Take 0.5-1 mg by mouth daily as needed for anxiety. Anxiety    [provider]  metoprolol succinate (TOPROL-XL) 50 MG 24 hr tablet TAKE ONE TABLET BY MOUTH EVERYDAY AT BEDTIME 11/06/20   Jerline Pain, MD  montelukast (SINGULAIR) 10 MG tablet Take 10 mg by mouth at bedtime. 02/21/14   [provider]  mupirocin ointment (BACTROBAN) 2 % Apply 1  application topically 2 (two) times daily. 10/03/20   Sheffield, Ronalee Red, PA-C  omeprazole (PRILOSEC) 20 MG capsule Take 20 mg by mouth at bedtime.    [provider]  ondansetron (ZOFRAN) 4 MG tablet Take 1 tablet (4 mg total) by mouth every 8 (eight) hours as needed for nausea or vomiting. 06/15/20   McKenzie, Lennie Muckle, PA-C  pravastatin (PRAVACHOL) 40 MG tablet Take 40 mg by mouth at bedtime.  09/04/10   [provider]  Donnal Debar 100-62.5-25 MCG/INH AEPB INHALE 1 PUFF BY MOUTH EVERY DAY 02/04/20   Collene Gobble, MD    Inpatient Medications: Scheduled Meds:  apixaban  5 mg Oral BID   Fluticasone-Umeclidin-Vilant  1 puff Inhalation Daily   furosemide  40 mg Oral Daily   metoprolol succinate  50 mg Oral  QHS   montelukast  10 mg Oral QHS   potassium chloride  40 mEq Oral Once   pravastatin  40 mg Oral QHS   Continuous Infusions:  diltiazem (CARDIZEM) infusion 5 mg/hr (11/16/20 0408)   PRN Meds: acetaminophen, albuterol, ondansetron (ZOFRAN) IV  Allergies:    Allergies  Allergen Reactions   Food Shortness Of Breath    ALLERGY= MUSHROOMS   Tequin Shortness Of Breath   Codeine Nausea And Vomiting   Erythromycin Other (See Comments)    GI UPSET   Levaquin [Levofloxacin In D5w] Other (See Comments)    "made me want to die."   Oxycodone-Acetaminophen Itching   Wellbutrin [Bupropion Hcl] Other (See Comments)    SHAKING   Penicillins Rash    Reaction: Childhood   Prednisone Rash    Social History:   Social History   Socioeconomic History   Marital status: Divorced    Spouse name: Not on file   Number of children: 3   Years of education: College   Highest education level: Not on file  Occupational History   Occupation: Emergency planning/management officer  Tobacco Use   Smoking status: Former    Packs/day: 1.00    Years: 20.00    Pack years: 20.00    Types: Cigarettes    Quit date: 05/21/1991    Years since quitting: 29.5   Smokeless tobacco: Never  Vaping Use    Vaping Use: Never used  Substance and Sexual Activity   Alcohol use: No   Drug use: No   Sexual activity: Not on file  Other Topics Concern   Not on file  Social History Narrative   Patient is divorced with 3 children.   Patient is right handed.   Patient has a college education.   Patient drinks 1-2 servings daily.   Social Determinants of Health   Financial Resource Strain: Not on file  Food Insecurity: Not on file  Transportation Needs: Not on file  Physical Activity: Not on file  Stress: Not on file  Social Connections: Not on file  Intimate Partner Violence: Not on file    Family History:    Family History  Problem Relation Age of Onset   Colon polyps Sister 24       PRECANCEROUS POLYPS   Breast cancer Maternal Grandmother 3   Emphysema Father    Asthma Father    Heart disease Father    Heart disease Paternal Grandfather    Colon cancer Neg Hx     ROS:  Review of Systems: [y] = yes, [ ]  = no      General: Weight gain [ ] ; Weight loss [ ] ; Anorexia [ ] ; Fatigue [ ] ; Fever [ ] ; Chills [ ] ; Weakness [ ]    Cardiac: Chest pain/pressure [y]; Resting SOB [ ] ; Exertional SOB [ ] ; Orthopnea [ ] ; Pedal Edema [ ] ; Palpitations [y]; Syncope [ ] ; Presyncope [ ] ; Paroxysmal nocturnal dyspnea [ ]    Pulmonary: Cough [ ] ; Wheezing [ ] ; Hemoptysis [ ] ; Sputum [ ] ; Snoring [ ]    GI: Vomiting [ ] ; Dysphagia [ ] ; Melena [ ] ; Hematochezia [ ] ; Heartburn [ ] ; Abdominal pain [ ] ; Constipation [ ] ; Diarrhea [ ] ; BRBPR [ ]    GU: Hematuria [ ] ; Dysuria [ ] ; Nocturia [ ]  Vascular: Pain in legs with walking [ ] ; Pain in feet with lying flat [ ] ; Non-healing sores [ ] ; Stroke [ ] ; TIA [ ] ; Slurred speech [ ] ;   Neuro: Headaches [ ] ;  Vertigo [ ] ; Seizures [ ] ; Paresthesias [ ] ;Blurred vision [ ] ; Diplopia [ ] ; Vision changes [ ]    Ortho/Skin: Arthritis [ ] ; Joint pain [ ] ; Muscle pain [ ] ; Joint swelling [ ] ; Back Pain [ ] ; Rash [ ]    Psych: Depression [ ] ; Anxiety [ ]    Heme: Bleeding  problems [ ] ; Clotting disorders [ ] ; Anemia [ ]    Endocrine: Diabetes [ ] ; Thyroid dysfunction [ ]    Physical Exam/Data:   Vitals:   11/16/20 0520 11/16/20 0523 11/16/20 0550 11/16/20 0600  BP: 114/79 (!) 102/51 110/60 (!) 114/58  Pulse: (!) 116 86 (!) 107 (!) 107  Resp: 20 13 17 20   Temp:      TempSrc:      SpO2: 96% 97% 96% 96%    Intake/Output Summary (Last 24 hours) at 11/16/2020 0607 Last data filed at 11/16/2020 0439 Gross per 24 hour  Intake 500 ml  Output --  Net 500 ml   Last 3 Weights 11/01/2020 09/29/2020 08/13/2020  Weight (lbs) 212 lb 215 lb 12.8 oz 222 lb 7.1 oz  Weight (kg) 96.163 kg 97.886 kg 100.9 kg     There is no height or weight on file to calculate BMI.  General:  Well nourished, well developed, in no acute distress HEENT: normal Lymph: no adenopathy Neck: no JVD Endocrine:  No thryomegaly Vascular: No carotid bruits; FA pulses 2+ bilaterally without bruits  Cardiac:  irregular rate, accelerated rhythm  Lungs:  clear to auscultation bilaterally, no wheezing, rhonchi or rales  Abd: soft, nontender, no hepatomegaly  Ext: no edema Musculoskeletal:  No deformities, BUE and BLE strength normal and equal Skin: warm and dry  Neuro:  CNs 2-12 intact, no focal abnormalities noted Psych:  Normal affect   EKG:  The EKG was personally reviewed and demonstrates: (11/16/20, 03:54:40) AF/RVR (HR 138), no ischemic changes  Telemetry:  Telemetry was personally reviewed and demonstrates: AF/RVR 120-130 HR  Relevant CV Studies:  TTE Result date: 09/16/19  1. Left ventricular ejection fraction, by estimation, is 65 to 70%. The  left ventricle has normal function. The left ventricle has no regional  wall motion abnormalities. Left ventricular diastolic parameters are  indeterminate.   2. Right ventricular systolic function is normal. The right ventricular  size is normal. There is normal pulmonary artery systolic pressure.   3. Left atrial size was mildly dilated.    4. The mitral valve is normal in structure. Trivial mitral valve  regurgitation. No evidence of mitral stenosis.   5. The aortic valve is normal in structure. Aortic valve regurgitation is  not visualized. No aortic stenosis is present.   6. The inferior vena cava is dilated in size with <50% respiratory  variability, suggesting right atrial pressure of 15 mmHg.   Laboratory Data:  High Sensitivity Troponin:   Recent Labs  Lab 11/16/20 0356  TROPONINIHS 12     Chemistry Recent Labs  Lab 11/16/20 0356 11/16/20 0406  NA 141 143  K 3.3* 3.2*  CL 106 106  CO2 23  --   GLUCOSE 126* 123*  BUN 18 20  CREATININE 0.74 0.70  CALCIUM 9.2  --   GFRNONAA >60  --   ANIONGAP 12  --     Recent Labs  Lab 11/16/20 0356  PROT 6.2*  ALBUMIN 3.6  AST 20  ALT 14  ALKPHOS 75  BILITOT 0.7   Hematology Recent Labs  Lab 11/16/20 0356 11/16/20 0406  WBC 5.7  --  RBC 4.54  --   HGB 12.5 12.2  HCT 39.2 36.0  MCV 86.3  --   MCH 27.5  --   MCHC 31.9  --   RDW 14.6  --   PLT 242  --    BNPNo results for input(s): BNP, PROBNP in the last 168 hours.  DDimer No results for input(s): DDIMER in the last 168 hours.  Radiology/Studies:  CT Angio Chest PE W and/or Wo Contrast  Result Date: 11/16/2020 CLINICAL DATA:  75 year old female with chest pain, shortness of breath. Atrial fibrillation with RVR. EXAM: CT ANGIOGRAPHY CHEST WITH CONTRAST TECHNIQUE: Multidetector CT imaging of the chest was performed using the standard protocol during bolus administration of intravenous contrast. Multiplanar CT image reconstructions and MIPs were obtained to evaluate the vascular anatomy. CONTRAST:  55mL OMNIPAQUE IOHEXOL 350 MG/ML SOLN COMPARISON:  Chest CTA 11/26/2017.  Portable chest 0401 hours today. Thyroid ultrasound and biopsy 08/04/2003. FINDINGS: Cardiovascular: Good contrast bolus timing in the pulmonary arterial tree. Mild respiratory motion. No focal filling defect identified in the pulmonary  arteries to suggest acute pulmonary embolism. No cardiomegaly or pericardial effusion. Calcified aortic atherosclerosis. Little contrast in the aorta today. Faint calcified coronary artery atherosclerosis on series 7, image 224. Mediastinum/Nodes: Chronic thyromegaly This has been evaluated on previous imaging. (ref: J Am Coll Radiol. 2015 Feb;12(2): 143-50).Bilateral mediastinal lymph nodes have enlarged since 2019, now up to 13 mm in the AP window, 15 mm precarinal (previously subcentimeter). Similar mild enlargement of right hilar nodes. Lungs/Pleura: Major airways are patent. Similar lung volumes to 2019. Chronic bilateral lower lobe curvilinear scarring and atelectasis has not significantly changed. No pleural effusion. Mosaic attenuation elsewhere in the lungs likely due to gas trapping. No consolidation or inflammatory pulmonary opacity identified. No pleural effusion. The small 5 mm posterior right upper lobe nodule seen in 2019 does not persist. No pulmonary nodule identified today. Upper Abdomen: Negative visible liver, gallbladder, spleen, pancreas, adrenal glands, kidneys, and bowel. Musculoskeletal: Partially visible cervical ACDF. No acute or suspicious osseous lesion identified. Review of the MIP images confirms the above findings. IMPRESSION: 1. No evidence of acute pulmonary embolus. 2. Mild nonspecific mediastinal lymphadenopathy, new since 2019. The affected lymph nodes appear reactive, but no corresponding pulmonary infectious or inflammatory process is identified. A lymphoproliferative disorder cannot be excluded. 3. Chronic lower lobe lung scarring and mild pulmonary gas trapping. A small 5 mm right upper lobe nodule seen in 2019 has resolved. 4. Aortic Atherosclerosis (ICD10-I70.0). Electronically Signed   By: Genevie Ann M.D.   On: 11/16/2020 05:52   DG Chest Portable 1 View  Result Date: 11/16/2020 CLINICAL DATA:  Atrial fibrillation EXAM: PORTABLE CHEST 1 VIEW COMPARISON:  08/13/2020  FINDINGS: 15 mm nodular opacity is seen at the right lung base, new from prior examination. Minimal left basilar atelectasis or scarring. No superimposed confluent pulmonary infiltrate. No pneumothorax or pleural effusion. Cardiac size within normal limits. Pulmonary vascularity is normal. No acute bone abnormality. IMPRESSION: No radiographic evidence of acute cardiopulmonary disease. New 15 mm nodular opacity at the right lung base. This would be better assessed with a nonemergent standard two view chest radiograph once the patient's acute issues have resolved. Electronically Signed   By: Fidela Salisbury MD   On: 11/16/2020 04:13    Assessment and Plan:   AF/RVR Ms. Tokar appears to have an overall low burden of AF over the past year although I do not think she has great rhythm awareness and less she is in  RVR and become symptomatic.  Even when she had AF/RVR in the 170s she had only symptoms of mild chest pressure but denied feeling any palpitations.  I discussed with her different options for management of her AF including continuation of rate control agents such as her metoprolol and/or diltiazem.  We discussed that another option would be trying to get her back into NSR with cardioversion followed by maintenance of sinus rhythm with an AAD.  The data for AF ablation in her age range and being more aggressive in treatment of her atrial fibrillation is somewhat equivocal as opposed to a younger patient.  She is fairly sedentary around the house and does not walk more than a few 100 feet throughout her house each day so I think she is relatively asymptomatic whether she is in NSR or rate controlled AF.  Given that she has been on metoprolol and this has not adequately controlled her RVR I think it be reasonable to cardiovert her and try her on a rhythm medication such as Tikosyn.  Her renal function is normal and her QTC went in normal sinus rhythm is not prolonged.  She unfortunately did miss a single dose  of Eliquis in the past 2 weeks but otherwise has been very good about taking all of her medications.  She gets all of her medications and pill packs but did say she slept in 1 day and skipped all of her morning meds because it was too late when she woke up. - EP to evaluate this morning, consider TEE/DCCV+tikosyn load given AF not well controlled on nodal blocking agents, leave NPO until they evaluate and decide whether they would prefer rate vs rhythm control - continue dilt gtt for now for RVR - continue eliquis 5 mg PO bid, last dose at home (06/29 PM)   For questions or updates, please contact [place] Please consult [email or web address] for contact info under   Signed, Dion Body, MD  11/16/2020 6:07 AM

## 2020-11-16 NOTE — ED Notes (Signed)
This RN attempted to call report to [name].

## 2020-11-16 NOTE — ED Triage Notes (Signed)
Pt bib EMS from home due to a-fib RVR. Pt was attempting to go to sleep and couldn't, had a feeling in her chest. Pt has Hx of a-fib with heart monitor placed. When she checked the monitor it said a-fib RVR. EMS found pts HR to be between 110-180. 10mg  of cardizem and 325 ASA given. BP was in the 160's before cardizem was given. After cardizem pt's BP dropped to the 90's. HR dropped to 80-140's BP stable in triage.  Complaints of substernal pressure.

## 2020-11-16 NOTE — Plan of Care (Signed)
  Problem: Education: Goal: Knowledge of General Education information will improve Description Including pain rating scale, medication(s)/side effects and non-pharmacologic comfort measures Outcome: Progressing   Problem: Health Behavior/Discharge Planning: Goal: Ability to manage health-related needs will improve Outcome: Progressing   

## 2020-11-16 NOTE — ED Provider Notes (Addendum)
[facility] EMERGENCY DEPARTMENT Provider Note   CSN: [id number] Arrival date & time: 11/16/20  6734     History No chief complaint on file.   [name] Molina is a 75 y.o. female.  The history is provided by the patient.  Palpitations Palpitations quality:  Fast Onset quality:  Sudden Duration:  3 hours Timing:  Constant Progression:  Unchanged Chronicity:  Recurrent Context: not stimulant use   Relieved by:  Nothing Worsened by:  Nothing Ineffective treatments:  None tried Associated symptoms: chest pressure, diaphoresis and shortness of breath   Associated symptoms: no dizziness   Risk factors: no hx of atrial fibrillation       Past Medical History:  Diagnosis Date   Anxiety    Aortic atherosclerosis (HCC)    Arthritis    Asthma    Atrial tachycardia (HCC)    Cancer (HCC)    basal skin cancer on nose   Cataract    Chronic diastolic CHF (congestive heart failure) (HCC)    Complication of anesthesia    during cervical surgery she woke up during the procedure   COPD (chronic obstructive pulmonary disease) ([place])    Depression    Diverticulosis    Family history of adverse reaction to anesthesia    mother had post op N&V   GERD (gastroesophageal reflux disease)    History of hiatal hernia    Hyperlipidemia    Hypertension    Insomnia    Macular degeneration    Migraines    Neuropathy    Obesity    PAF (paroxysmal atrial fibrillation) (HCC)    Pneumonia    Sleep apnea    history of it,not using a cpap, study 2018: didn't need cpap   Thyroid nodule 2000   radioactive caspule    Patient Active Problem List   Diagnosis Date Noted   Chronic respiratory failure with hypoxia ([place]) 09/29/2020   AF (paroxysmal atrial fibrillation) ([place]) 08/13/2020   Major depressive disorder 08/13/2020   Chronic diastolic CHF (congestive heart failure) ([place]) 08/13/2020   Elevated troponin level not due myocardial infarction 08/13/2020   Atrial  fibrillation with RVR ([place]) 09/18/2019   Edema of both lower extremities 05/07/2018   Pulmonary nodule 12/01/2017   Dyspnea 11/26/2017   RUQ abdominal pain 11/27/2015   Suprapubic pain 11/27/2015   Dysphagia 11/27/2015   Abdominal mass, RUQ (right upper quadrant) 11/27/2015   Obstructive sleep apnea 04/06/2015   PAT (paroxysmal atrial tachycardia) ([place]) 08/09/2014   GERD without esophagitis    Mixed hyperlipidemia    Spinal stenosis of lumbar region 03/16/2014   Idiopathic peripheral neuropathy 03/16/2014   Hypertension 06/13/2011   Allergic rhinitis 06/13/2011   Chronic headaches 06/13/2011   COPD (chronic obstructive pulmonary disease) ([place]) 06/13/2011    Past Surgical History:  Procedure Laterality Date   ANTERIOR CERVICAL DECOMP/DISCECTOMY FUSION N/A 06/15/2020   Procedure: ANTERIOR CERVICAL DECOMPRESSION FUSION CERVICAL FIVE-CERVICAL SIX WITH INSTRUMENTATION AND ALLOGRAFT;  Surgeon: Phylliss Bob, MD;  Location: [facility];  Service: Orthopedics;  Laterality: N/A;  anterior   CERVICAL POLYPECTOMY     INSIDE AND OUTSIDE   COLONOSCOPY     EYE SURGERY Bilateral    cataracts   POLYPECTOMY     REMOVED CARTLIAGE RIB       OB History   No obstetric history on file.     Family History  Problem Relation Age of Onset   Colon polyps Sister 92       PRECANCEROUS POLYPS  Breast cancer Maternal Grandmother 33   Emphysema Father    Asthma Father    Heart disease Father    Heart disease Paternal Grandfather    Colon cancer Neg Hx     Social History   Tobacco Use   Smoking status: Former    Packs/day: 1.00    Years: 20.00    Pack years: 20.00    Types: Cigarettes    Quit date: 05/21/1991    Years since quitting: 29.5   Smokeless tobacco: Never  Vaping Use   Vaping Use: Never used  Substance Use Topics   Alcohol use: No   Drug use: No    Home Medications Prior to Admission medications   Medication Sig Start Date End Date Taking? Authorizing Provider  acetaminophen  (TYLENOL) 650 MG CR tablet Take 1,300 mg by mouth every 8 (eight) hours as needed for pain.    [provider]  albuterol (PROVENTIL HFA;VENTOLIN HFA) 108 (90 Base) MCG/ACT inhaler Inhale 2 puffs into the lungs every 6 (six) hours as needed for wheezing or shortness of breath. 03/05/18   Collene Gobble, MD  alclomethasone (ACLOVATE) 0.05 % cream Apply topically 2 (two) times daily as needed (Rash). 10/03/20   Sheffield, Kelli R, PA-C  BESIVANCE 0.6 % SUSP Place 1 drop into the left eye See admin instructions. Instill 1 drop qid day of eye injection and qid the day after. 09/07/19   [provider]  cetirizine (ZYRTEC) 10 MG tablet Take 1 tablet (10 mg total) by mouth daily. 10/02/20   Collene Gobble, MD  Cyanocobalamin (VITAMIN B-12 PO) Take 2,500 mcg by mouth daily.    [provider]  DULoxetine (CYMBALTA) 60 MG capsule Take 60 mg by mouth every morning. 03/23/20   [provider]  ELIQUIS 5 MG TABS tablet Take 5 mg by mouth 2 (two) times daily. 07/20/20   [provider]  fluticasone (FLONASE) 50 MCG/ACT nasal spray Place 1 spray into both nostrils daily. 07/19/16   Collene Gobble, MD  furosemide (LASIX) 40 MG tablet Take 2 tablets (80 mg total) by mouth daily. Take (2) tablets twice a day for 7 days then return to 40 mg a day 10/23/20   Jerline Pain, MD  ipratropium-albuterol (DUONEB) 0.5-2.5 (3) MG/3ML SOLN INHALE contents of one vial using nebulizer TWICE DAILY 11/06/20   Collene Gobble, MD  LORazepam (ATIVAN) 0.5 MG tablet Take 0.5-1 mg by mouth daily as needed for anxiety. Anxiety    [provider]  metoprolol succinate (TOPROL-XL) 50 MG 24 hr tablet TAKE ONE TABLET BY MOUTH EVERYDAY AT BEDTIME 11/06/20   Jerline Pain, MD  montelukast (SINGULAIR) 10 MG tablet Take 10 mg by mouth at bedtime. 02/21/14   [provider]  Multiple Vitamins-Minerals (OCUVITE EYE + MULTI) TABS Take 1 tablet by mouth in the morning and at bedtime.    [provider]  mupirocin ointment (BACTROBAN) 2 % Apply 1 application topically 2 (two) times daily. 10/03/20   Sheffield, Ronalee Red, PA-C  omeprazole (PRILOSEC) 20 MG capsule Take 20 mg by mouth at bedtime.    [provider]  ondansetron (ZOFRAN) 4 MG tablet Take 1 tablet (4 mg total) by mouth every 8 (eight) hours as needed for nausea or vomiting. 06/15/20   McKenzie, Lennie Muckle, PA-C  pravastatin (PRAVACHOL) 40 MG tablet Take 40 mg by mouth at bedtime.  09/04/10   [provider]  Donnal Debar 100-62.5-25 MCG/INH AEPB INHALE  1 PUFF BY MOUTH EVERY DAY 02/04/20   Collene Gobble, MD    Allergies    Food, Tequin, Codeine, Erythromycin, Levaquin [levofloxacin in d5w], Oxycodone-acetaminophen, Wellbutrin [bupropion hcl], Penicillins, and Prednisone  Review of Systems   Review of Systems  Constitutional:  Positive for diaphoresis.  HENT:  Negative for voice change.   Eyes:  Negative for redness.  Respiratory:  Positive for shortness of breath.   Cardiovascular:  Positive for palpitations.  Genitourinary:  Negative for difficulty urinating.  Musculoskeletal:  Negative for neck stiffness.  Skin:  Negative for wound.  Neurological:  Negative for dizziness.  Psychiatric/Behavioral:  Negative for agitation.   All other systems reviewed and are negative.  Physical Exam Updated Vital Signs BP 97/69   Pulse (!) 129   Temp 97.8 F (36.6 C) (Oral)   Resp (!) 22   SpO2 98%   Physical Exam Vitals and nursing note reviewed.  Constitutional:      General: She is not in acute distress.    Appearance: Normal appearance.  HENT:     Head: Normocephalic and atraumatic.     Nose: Nose normal.  Eyes:     Conjunctiva/sclera: Conjunctivae normal.     Pupils: Pupils are equal, round, and reactive to light.  Cardiovascular:     Rate and Rhythm: Tachycardia present. Rhythm irregular.     Pulses: Normal pulses.     Heart sounds: Normal heart sounds.  Pulmonary:     Effort: Pulmonary  effort is normal.     Breath sounds: Normal breath sounds.  Abdominal:     General: Abdomen is flat. Bowel sounds are normal.     Palpations: Abdomen is soft.     Tenderness: There is no abdominal tenderness. There is no guarding.  Musculoskeletal:        General: Normal range of motion.     Cervical back: Normal range of motion and neck supple.  Skin:    General: Skin is warm and dry.     Capillary Refill: Capillary refill takes less than 2 seconds.  Neurological:     General: No focal deficit present.     Mental Status: She is alert and oriented to person, place, and time.     Deep Tendon Reflexes: Reflexes normal.  Psychiatric:        Mood and Affect: Mood normal.        Behavior: Behavior normal.    ED Results / Procedures / Treatments   Labs (all labs ordered are listed, but only abnormal results are displayed) Results for orders placed or performed during the hospital encounter of 11/16/20  CBC with Differential/Platelet  Result Value Ref Range   WBC 5.7 4.0 - 10.5 K/uL   RBC 4.54 3.87 - 5.11 MIL/uL   Hemoglobin 12.5 12.0 - 15.0 g/dL   HCT 39.2 36.0 - 46.0 %   MCV 86.3 80.0 - 100.0 fL   MCH 27.5 26.0 - 34.0 pg   MCHC 31.9 30.0 - 36.0 g/dL   RDW 14.6 11.5 - 15.5 %   Platelets 242 150 - 400 K/uL   nRBC 0.0 0.0 - 0.2 %   Neutrophils Relative % 61 %   Neutro Abs 3.5 1.7 - 7.7 K/uL   Lymphocytes Relative 21 %   Lymphs Abs 1.2 0.7 - 4.0 K/uL   Monocytes Relative 13 %   Monocytes Absolute 0.7 0.1 - 1.0 K/uL   Eosinophils Relative 3 %   Eosinophils Absolute 0.2 0.0 - 0.5 K/uL  Basophils Relative 1 %   Basophils Absolute 0.1 0.0 - 0.1 K/uL   Immature Granulocytes 1 %   Abs Immature Granulocytes 0.03 0.00 - 0.07 K/uL  Comprehensive metabolic panel  Result Value Ref Range   Sodium 141 135 - 145 mmol/L   Potassium 3.3 (L) 3.5 - 5.1 mmol/L   Chloride 106 98 - 111 mmol/L   CO2 23 22 - 32 mmol/L   Glucose, Bld 126 (H) 70 - 99 mg/dL   BUN 18 8 - 23 mg/dL   Creatinine,  Ser 0.74 0.44 - 1.00 mg/dL   Calcium 9.2 8.9 - 10.3 mg/dL   Total Protein 6.2 (L) 6.5 - 8.1 g/dL   Albumin 3.6 3.5 - 5.0 g/dL   AST 20 15 - 41 U/L   ALT 14 0 - 44 U/L   Alkaline Phosphatase 75 38 - 126 U/L   Total Bilirubin 0.7 0.3 - 1.2 mg/dL   GFR, Estimated >60 >60 mL/min   Anion gap 12 5 - 15  Protime-INR  Result Value Ref Range   Prothrombin Time 14.9 11.4 - 15.2 seconds   INR 1.2 0.8 - 1.2  I-stat chem 8, ED (not at [facility] or [facility])  Result Value Ref Range   Sodium 143 135 - 145 mmol/L   Potassium 3.2 (L) 3.5 - 5.1 mmol/L   Chloride 106 98 - 111 mmol/L   BUN 20 8 - 23 mg/dL   Creatinine, Ser 0.70 0.44 - 1.00 mg/dL   Glucose, Bld 123 (H) 70 - 99 mg/dL   Calcium, Ion 1.15 1.15 - 1.40 mmol/L   TCO2 24 22 - 32 mmol/L   Hemoglobin 12.2 12.0 - 15.0 g/dL   HCT 36.0 36.0 - 46.0 %  Troponin I (High Sensitivity)  Result Value Ref Range   Troponin I (High Sensitivity) 12 <18 ng/L   DG Chest Portable 1 View  Result Date: 11/16/2020 CLINICAL DATA:  Atrial fibrillation EXAM: PORTABLE CHEST 1 VIEW COMPARISON:  08/13/2020 FINDINGS: 15 mm nodular opacity is seen at the right lung base, new from prior examination. Minimal left basilar atelectasis or scarring. No superimposed confluent pulmonary infiltrate. No pneumothorax or pleural effusion. Cardiac size within normal limits. Pulmonary vascularity is normal. No acute bone abnormality. IMPRESSION: No radiographic evidence of acute cardiopulmonary disease. New 15 mm nodular opacity at the right lung base. This would be better assessed with a nonemergent standard two view chest radiograph once the patient's acute issues have resolved. Electronically Signed   By: Fidela Salisbury MD   On: 11/16/2020 04:13     EKG EKG Interpretation  Date/Time:  Thursday November 16 2020 03:54:40 EDT Ventricular Rate:  138 PR Interval:    QRS Duration: 93 QT Interval:  282 QTC Calculation: 413 R Axis:   26 Text Interpretation: Atrial fibrillation Abnormal R-wave  progression, early transition Repolarization abnormality, prob rate related Confirmed by Randal Buba, Leonid Manus ([id number]) on 11/16/2020 4:18:09 AM  Radiology DG Chest Portable 1 View  Result Date: 11/16/2020 CLINICAL DATA:  Atrial fibrillation EXAM: PORTABLE CHEST 1 VIEW COMPARISON:  08/13/2020 FINDINGS: 15 mm nodular opacity is seen at the right lung base, new from prior examination. Minimal left basilar atelectasis or scarring. No superimposed confluent pulmonary infiltrate. No pneumothorax or pleural effusion. Cardiac size within normal limits. Pulmonary vascularity is normal. No acute bone abnormality. IMPRESSION: No radiographic evidence of acute cardiopulmonary disease. New 15 mm nodular opacity at the right lung base. This would be better assessed with a nonemergent standard two view chest  radiograph once the patient's acute issues have resolved. Electronically Signed   By: Fidela Salisbury MD   On: 11/16/2020 04:13    Procedures Procedures   Medications Ordered in ED Medications  diltiazem (CARDIZEM) 125 mg in dextrose 5% 125 mL (1 mg/mL) infusion (5 mg/hr Intravenous New Bag/Given 11/16/20 0408)  sodium chloride 0.9 % bolus 500 mL (0 mLs Intravenous Stopped 11/16/20 0439)    ED Course  I have reviewed the triage vital signs and the nursing notes.  Pertinent labs & imaging results that were available during my care of the patient were reviewed by me and considered in my medical decision making (see chart for details).   Case d/w Dr. Ailene Ravel of cardiology who will see the patient .   MDM Reviewed: previous chart, nursing note and vitals Interpretation: labs, ECG and x-ray (negative troponin, no PNA by me on CXR) Total time providing critical care: 30-74 minutes (diltiazem drip). This excludes time spent performing separately reportable procedures and services. Consults: admitting MD and cardiology CRITICAL CARE Performed by: Joelie Schou K Zeriah Baysinger-Rasch Total critical care time: 60 minutes Critical  care time was exclusive of separately billable procedures and treating other patients. Critical care was necessary to treat or prevent imminent or life-threatening deterioration. Critical care was time spent personally by me on the following activities: development of treatment plan with patient and/or surrogate as well as nursing, discussions with consultants, evaluation of patient's response to treatment, examination of patient, obtaining history from patient or surrogate, ordering and performing treatments and interventions, ordering and review of laboratory studies, ordering and review of radiographic studies, pulse oximetry and re-evaluation of patient's condition.      Final Clinical Impression(s) / ED Diagnoses Final diagnoses:  None   Admit to medicine   Rx / DC Orders ED Discharge Orders     None        Kadience Macchi, MD 11/16/20 [place], Clee Pandit, MD 11/16/20 0102

## 2020-11-16 NOTE — H&P (Signed)
History and Physical    [name] Molina OVZ:[id number] DOB: 12-26-1945 DOA: 11/16/2020  PCP: Harlan Stains, MD   Patient coming from: Home   Chief Complaint: Chest pressure, SOB, rapid atrial fibrillation   HPI: [name] Molina is a 75 y.o. female with medical history significant for COPD, chronic hypoxic respiratory failure, paroxysmal atrial fibrillation on Eliquis, chronic diastolic CHF, and anxiety, now presenting to the emergency department for evaluation of chest pressure, shortness of breath, palpitations, and atrial fibrillation with RVR on home monitor.  Patient reports that she was in her usual state of health, was more active than usual throughout the day, and was then lying down to sleep when she developed shortness of breath, heavy sensation in her chest, and palpitations.  She reports that atrial fibrillation with rates in the 150s to 170s was noted on her home monitoring device.  She spoke with on-call cardiologist who recommended evaluation in the emergency department.  Family called EMS for transport to the hospital.  Patient reports that she took her metoprolol at bedtime as usual.  ED Course: Upon arrival to the ED, patient is found to be afebrile, saturating well on 2 L/min of supplemental oxygen, tachycardic to the 130s, and with blood pressure 97/69.  EKG features atrial fibrillation with rate 138.  Chest x-ray negative for acute cardiopulmonary disease but notable for 15 mm nodular opacity at the right base.  Chemistry panel notable for potassium of 3.3.  CBC is unremarkable.  High-sensitivity troponin is normal.  COVID-19 PCR is negative.  Cardiology was consulted by the ED physician and the patient was given 500 cc of saline and started on diltiazem infusion.  Review of Systems:  All other systems reviewed and apart from HPI, are negative.  Past Medical History:  Diagnosis Date   Anxiety    Aortic atherosclerosis (HCC)    Arthritis    Asthma    Atrial tachycardia ([place])     Cancer (HCC)    basal skin cancer on nose   Cataract    Chronic diastolic CHF (congestive heart failure) (HCC)    Complication of anesthesia    during cervical surgery she woke up during the procedure   COPD (chronic obstructive pulmonary disease) ([place])    Depression    Diverticulosis    Family history of adverse reaction to anesthesia    mother had post op N&V   GERD (gastroesophageal reflux disease)    History of hiatal hernia    Hyperlipidemia    Hypertension    Insomnia    Macular degeneration    Migraines    Neuropathy    Obesity    PAF (paroxysmal atrial fibrillation) ([place])    Pneumonia    Sleep apnea    history of it,not using a cpap, study 2018: didn't need cpap   Thyroid nodule 2000   radioactive caspule    Past Surgical History:  Procedure Laterality Date   ANTERIOR CERVICAL DECOMP/DISCECTOMY FUSION N/A 06/15/2020   Procedure: ANTERIOR CERVICAL DECOMPRESSION FUSION CERVICAL FIVE-CERVICAL SIX WITH INSTRUMENTATION AND ALLOGRAFT;  Surgeon: Phylliss Bob, MD;  Location: [place];  Service: Orthopedics;  Laterality: N/A;  anterior   CERVICAL POLYPECTOMY     INSIDE AND OUTSIDE   COLONOSCOPY     EYE SURGERY Bilateral    cataracts   POLYPECTOMY     REMOVED CARTLIAGE RIB      Social History:   reports that she quit smoking about 29 years ago. Her smoking use included cigarettes. She has  a 20.00 pack-year smoking history. She has never used smokeless tobacco. She reports that she does not drink alcohol and does not use drugs.  Allergies  Allergen Reactions   Food Shortness Of Breath    ALLERGY= MUSHROOMS   Tequin Shortness Of Breath   Codeine Nausea And Vomiting   Erythromycin Other (See Comments)    GI UPSET   Levaquin [Levofloxacin In D5w] Other (See Comments)    "made me want to die."   Oxycodone-Acetaminophen Itching   Wellbutrin [Bupropion Hcl] Other (See Comments)    SHAKING   Penicillins Rash    Reaction: Childhood   Prednisone Rash    Family History   Problem Relation Age of Onset   Colon polyps Sister 79       PRECANCEROUS POLYPS   Breast cancer Maternal Grandmother 10   Emphysema Father    Asthma Father    Heart disease Father    Heart disease Paternal Grandfather    Colon cancer Neg Hx      Prior to Admission medications   Medication Sig Start Date End Date Taking? Authorizing Provider  acetaminophen (TYLENOL) 650 MG CR tablet Take 1,300 mg by mouth every 8 (eight) hours as needed for pain.    [provider]  albuterol (PROVENTIL HFA;VENTOLIN HFA) 108 (90 Base) MCG/ACT inhaler Inhale 2 puffs into the lungs every 6 (six) hours as needed for wheezing or shortness of breath. 03/05/18   Collene Gobble, MD  alclomethasone (ACLOVATE) 0.05 % cream Apply topically 2 (two) times daily as needed (Rash). 10/03/20   Sheffield, Kelli R, PA-C  BESIVANCE 0.6 % SUSP Place 1 drop into the left eye See admin instructions. Instill 1 drop qid day of eye injection and qid the day after. 09/07/19   [provider]  cetirizine (ZYRTEC) 10 MG tablet Take 1 tablet (10 mg total) by mouth daily. 10/02/20   Collene Gobble, MD  Cyanocobalamin (VITAMIN B-12 PO) Take 2,500 mcg by mouth daily.    [provider]  DULoxetine (CYMBALTA) 60 MG capsule Take 60 mg by mouth every morning. 03/23/20   [provider]  ELIQUIS 5 MG TABS tablet Take 5 mg by mouth 2 (two) times daily. 07/20/20   [provider]  fluticasone (FLONASE) 50 MCG/ACT nasal spray Place 1 spray into both nostrils daily. 07/19/16   Collene Gobble, MD  furosemide (LASIX) 40 MG tablet Take 2 tablets (80 mg total) by mouth daily. Take (2) tablets twice a day for 7 days then return to 40 mg a day 10/23/20   Jerline Pain, MD  ipratropium-albuterol (DUONEB) 0.5-2.5 (3) MG/3ML SOLN INHALE contents of one vial using nebulizer TWICE DAILY 11/06/20   Collene Gobble, MD  LORazepam (ATIVAN) 0.5 MG tablet Take 0.5-1 mg by mouth daily as needed for anxiety. Anxiety     [provider]  metoprolol succinate (TOPROL-XL) 50 MG 24 hr tablet TAKE ONE TABLET BY MOUTH EVERYDAY AT BEDTIME 11/06/20   Jerline Pain, MD  montelukast (SINGULAIR) 10 MG tablet Take 10 mg by mouth at bedtime. 02/21/14   [provider]  Multiple Vitamins-Minerals (OCUVITE EYE + MULTI) TABS Take 1 tablet by mouth in the morning and at bedtime.    [provider]  mupirocin ointment (BACTROBAN) 2 % Apply 1 application topically 2 (two) times daily. 10/03/20   Sheffield, Ronalee Red, PA-C  omeprazole (PRILOSEC) 20 MG capsule Take 20 mg by mouth at bedtime.    [provider]  ondansetron (ZOFRAN) 4 MG tablet Take 1 tablet (4 mg total) by mouth every 8 (eight) hours as needed for nausea or vomiting. 06/15/20   McKenzie, Lennie Muckle, PA-C  pravastatin (PRAVACHOL) 40 MG tablet Take 40 mg by mouth at bedtime.  09/04/10   [provider]  Donnal Debar 100-62.5-25 MCG/INH AEPB INHALE 1 PUFF BY MOUTH EVERY DAY 02/04/20   Collene Gobble, MD    Physical Exam: Vitals:   11/16/20 0430 11/16/20 0445 11/16/20 0520 11/16/20 0523  BP: 104/87 98/85 114/79 (!) 102/51  Pulse: (!) 127 (!) 134 (!) 116 86  Resp: 13 17 20 13   Temp:      TempSrc:      SpO2: 97% 98% 96% 97%    Constitutional: NAD, calm  Eyes: PERTLA, lids and conjunctivae normal ENMT: Mucous membranes are moist. Posterior pharynx clear of any exudate or lesions.   Neck: supple, no masses  Respiratory:  no wheezing, no crackles. No accessory muscle use.  Cardiovascular: Rate ~100 and irregularly irregular. No extremity edema.   Abdomen: No distension, no tenderness, soft. Bowel sounds active.  Musculoskeletal: no clubbing / cyanosis. No joint deformity upper and lower extremities.   Skin: no significant rashes, lesions, ulcers. Warm, dry, well-perfused. Neurologic: CN 2-12 grossly intact. Sensation intact. Moving all extremities.  Psychiatric: Alert and oriented to person, place, and situation. Very  pleasant and cooperative.    Labs and Imaging on Admission: I have personally reviewed following labs and imaging studies  CBC: Recent Labs  Lab 11/16/20 0356 11/16/20 0406  WBC 5.7  --   NEUTROABS 3.5  --   HGB 12.5 12.2  HCT 39.2 36.0  MCV 86.3  --   PLT 242  --    Basic Metabolic Panel: Recent Labs  Lab 11/16/20 0356 11/16/20 0406  NA 141 143  K 3.3* 3.2*  CL 106 106  CO2 23  --   GLUCOSE 126* 123*  BUN 18 20  CREATININE 0.74 0.70  CALCIUM 9.2  --    GFR: CrCl cannot be calculated (Unknown ideal weight.). Liver Function Tests: Recent Labs  Lab 11/16/20 0356  AST 20  ALT 14  ALKPHOS 75  BILITOT 0.7  PROT 6.2*  ALBUMIN 3.6   No results for input(s): LIPASE, AMYLASE in the last 168 hours. No results for input(s): AMMONIA in the last 168 hours. Coagulation Profile: Recent Labs  Lab 11/16/20 0356  INR 1.2   Cardiac Enzymes: No results for input(s): CKTOTAL, CKMB, CKMBINDEX, TROPONINI in the last 168 hours. BNP (last 3 results) No results for input(s): PROBNP in the last 8760 hours. HbA1C: No results for input(s): HGBA1C in the last 72 hours. CBG: No results for input(s): GLUCAP in the last 168 hours. Lipid Profile: No results for input(s): CHOL, HDL, LDLCALC, TRIG, CHOLHDL, LDLDIRECT in the last 72 hours. Thyroid Function Tests: No results for input(s): TSH, T4TOTAL, FREET4, T3FREE, THYROIDAB in the last 72 hours. Anemia Panel: No results for input(s): VITAMINB12, FOLATE, FERRITIN, TIBC, IRON, RETICCTPCT in the last 72 hours. Urine analysis:    Component Value Date/Time   COLORURINE AMBER (A) 06/14/2020 1145   APPEARANCEUR CLOUDY (A) 06/14/2020 1145   LABSPEC 1.027 06/14/2020 1145   PHURINE 5.0 06/14/2020 1145   GLUCOSEU NEGATIVE 06/14/2020 1145   HGBUR NEGATIVE 06/14/2020 1145   BILIRUBINUR NEGATIVE 06/14/2020 1145   KETONESUR NEGATIVE 06/14/2020 1145   PROTEINUR NEGATIVE 06/14/2020 1145   NITRITE NEGATIVE 06/14/2020 1145   LEUKOCYTESUR  TRACE (A)  06/14/2020 1145   Sepsis Labs: @LABRCNTIP (procalcitonin:4,lacticidven:4) ) Recent Results (from the past 240 hour(s))  Resp Panel by RT-PCR (Flu A&B, Covid) Nasopharyngeal Swab     Status: None   Collection Time: 11/16/20  4:06 AM   Specimen: Nasopharyngeal Swab; Nasopharyngeal(NP) swabs in vial transport medium  Result Value Ref Range Status   SARS Coronavirus 2 by RT PCR NEGATIVE NEGATIVE Final    Comment: (NOTE) SARS-CoV-2 target nucleic acids are NOT DETECTED.  The SARS-CoV-2 RNA is generally detectable in upper respiratory specimens during the acute phase of infection. The lowest concentration of SARS-CoV-2 viral copies this assay can detect is 138 copies/mL. A negative result does not preclude SARS-Cov-2 infection and should not be used as the sole basis for treatment or other patient management decisions. A negative result may occur with  improper specimen collection/handling, submission of specimen other than nasopharyngeal swab, presence of viral mutation(s) within the areas targeted by this assay, and inadequate number of viral copies(<138 copies/mL). A negative result must be combined with clinical observations, patient history, and epidemiological information. The expected result is Negative.  Fact Sheet for Patients:  EntrepreneurPulse.com.au  Fact Sheet for Healthcare Providers:  IncredibleEmployment.be  This test is no t yet approved or cleared by the [place] FDA and  has been authorized for detection and/or diagnosis of SARS-CoV-2 by FDA under an Emergency Use Authorization (EUA). This EUA will remain  in effect (meaning this test can be used) for the duration of the COVID-19 declaration under Section 564(b)(1) of the Act, 21 U.S.C.section 360bbb-3(b)(1), unless the authorization is terminated  or revoked sooner.       Influenza A by PCR NEGATIVE NEGATIVE Final   Influenza B by PCR NEGATIVE NEGATIVE Final     Comment: (NOTE) The Xpert Xpress SARS-CoV-2/FLU/RSV plus assay is intended as an aid in the diagnosis of influenza from Nasopharyngeal swab specimens and should not be used as a sole basis for treatment. Nasal washings and aspirates are unacceptable for Xpert Xpress SARS-CoV-2/FLU/RSV testing.  Fact Sheet for Patients: EntrepreneurPulse.com.au  Fact Sheet for Healthcare Providers: IncredibleEmployment.be  This test is not yet approved or cleared by the [place] FDA and has been authorized for detection and/or diagnosis of SARS-CoV-2 by FDA under an Emergency Use Authorization (EUA). This EUA will remain in effect (meaning this test can be used) for the duration of the COVID-19 declaration under Section 564(b)(1) of the Act, 21 U.S.C. section 360bbb-3(b)(1), unless the authorization is terminated or revoked.  Performed at [facility], [place] [address], [place], [place] [address]      Radiological Exams on Admission: CT Angio Chest PE W and/or Wo Contrast  Result Date: 11/16/2020 CLINICAL DATA:  75 year old female with chest pain, shortness of breath. Atrial fibrillation with RVR. EXAM: CT ANGIOGRAPHY CHEST WITH CONTRAST TECHNIQUE: Multidetector CT imaging of the chest was performed using the standard protocol during bolus administration of intravenous contrast. Multiplanar CT image reconstructions and MIPs were obtained to evaluate the vascular anatomy. CONTRAST:  19mL OMNIPAQUE IOHEXOL 350 MG/ML SOLN COMPARISON:  Chest CTA 11/26/2017.  Portable chest 0401 hours today. Thyroid ultrasound and biopsy 08/04/2003. FINDINGS: Cardiovascular: Good contrast bolus timing in the pulmonary arterial tree. Mild respiratory motion. No focal filling defect identified in the pulmonary arteries to suggest acute pulmonary embolism. No cardiomegaly or pericardial effusion. Calcified aortic atherosclerosis. Little contrast in the aorta today. Faint calcified  coronary artery atherosclerosis on series 7, image 224. Mediastinum/Nodes: Chronic thyromegaly This has been evaluated on previous  imaging. (ref: J Am Coll Radiol. 2015 Feb;12(2): 143-50).Bilateral mediastinal lymph nodes have enlarged since 2019, now up to 13 mm in the AP window, 15 mm precarinal (previously subcentimeter). Similar mild enlargement of right hilar nodes. Lungs/Pleura: Major airways are patent. Similar lung volumes to 2019. Chronic bilateral lower lobe curvilinear scarring and atelectasis has not significantly changed. No pleural effusion. Mosaic attenuation elsewhere in the lungs likely due to gas trapping. No consolidation or inflammatory pulmonary opacity identified. No pleural effusion. The small 5 mm posterior right upper lobe nodule seen in 2019 does not persist. No pulmonary nodule identified today. Upper Abdomen: Negative visible liver, gallbladder, spleen, pancreas, adrenal glands, kidneys, and bowel. Musculoskeletal: Partially visible cervical ACDF. No acute or suspicious osseous lesion identified. Review of the MIP images confirms the above findings. IMPRESSION: 1. No evidence of acute pulmonary embolus. 2. Mild nonspecific mediastinal lymphadenopathy, new since 2019. The affected lymph nodes appear reactive, but no corresponding pulmonary infectious or inflammatory process is identified. A lymphoproliferative disorder cannot be excluded. 3. Chronic lower lobe lung scarring and mild pulmonary gas trapping. A small 5 mm right upper lobe nodule seen in 2019 has resolved. 4. Aortic Atherosclerosis (ICD10-I70.0). Electronically Signed   By: Genevie Ann M.D.   On: 11/16/2020 05:52   DG Chest Portable 1 View  Result Date: 11/16/2020 CLINICAL DATA:  Atrial fibrillation EXAM: PORTABLE CHEST 1 VIEW COMPARISON:  08/13/2020 FINDINGS: 15 mm nodular opacity is seen at the right lung base, new from prior examination. Minimal left basilar atelectasis or scarring. No superimposed confluent pulmonary  infiltrate. No pneumothorax or pleural effusion. Cardiac size within normal limits. Pulmonary vascularity is normal. No acute bone abnormality. IMPRESSION: No radiographic evidence of acute cardiopulmonary disease. New 15 mm nodular opacity at the right lung base. This would be better assessed with a nonemergent standard two view chest radiograph once the patient's acute issues have resolved. Electronically Signed   By: Fidela Salisbury MD   On: 11/16/2020 04:13    EKG: Independently reviewed. Atrial fibrillation, rate 138.   Assessment/Plan   1. Atrial fibrillation with RVR  - Presents with chest pressure, SOB, and rapid atrial fibrillation with rate 150-170s on home monitor - Given 10 mg diltiazem by EMS and started on diltiazem infusion in ED; rate now low 100s on 5 mg/hr  - Continue Eliquis, continue metoprolol, continue IV diltiazem for now pending cardiology recommendations    2. Chest pain  - Presents with acute onset chest pressure and dyspnea in setting of rapid atrial fibrillation  - Treated with ASA 325 mg by EMS pta  - Initial troponin normal; CTA negative for PE  - Discomfort resolved with rate-control  - Follow-up second troponin, repeat EKG now that rate slowed, continue statin and Toprol   3. Chronic diastolic CHF  - Appears compensated, EF was preserved in April 2021  - Continue Lasix   4. COPD; chronic hypoxic respiratory failure  - Appears stable   - Continue inhalers, supplemental O2  5. Hypokalemia  - Serum potassium is 3.3 on admission  - Replace, check mag level    DVT prophylaxis: Eliquis   Code Status: DNR, confirmed with patient   Level of Care: Level of care: Progressive Family Communication: None present   Disposition Plan:  Patient is from: home   Anticipated d/c is to: home  Anticipated d/c date is: 11/17/20 Patient currently: Pending rate-control, repeat troponin   Consults called: Cardiology  Admission status: Observation     Ilene Qua Lonie Rummell,  MD [facility]  11/16/2020, 6:00 AM

## 2020-11-16 NOTE — Progress Notes (Signed)
  Echocardiogram 2D Echocardiogram has been performed.  Bobbye Charleston 11/16/2020, 3:08 PM

## 2020-11-16 NOTE — ED Notes (Signed)
Patient transported to CT 

## 2020-11-16 NOTE — Plan of Care (Signed)
  Problem: Education: Goal: Knowledge of General Education information will improve Description: Including pain rating scale, medication(s)/side effects and non-pharmacologic comfort measures 11/16/2020 2240 by Mollie Germany, RN Outcome: Progressing 11/16/2020 2240 by Mollie Germany, RN Outcome: Progressing   Problem: Health Behavior/Discharge Planning: Goal: Ability to manage health-related needs will improve 11/16/2020 2240 by Mollie Germany, RN Outcome: Progressing 11/16/2020 2240 by Mollie Germany, RN Outcome: Progressing

## 2020-11-17 ENCOUNTER — Other Ambulatory Visit (HOSPITAL_COMMUNITY): Payer: Self-pay

## 2020-11-17 DIAGNOSIS — I5032 Chronic diastolic (congestive) heart failure: Secondary | ICD-10-CM | POA: Diagnosis not present

## 2020-11-17 DIAGNOSIS — I4891 Unspecified atrial fibrillation: Secondary | ICD-10-CM | POA: Diagnosis not present

## 2020-11-17 DIAGNOSIS — E876 Hypokalemia: Secondary | ICD-10-CM | POA: Diagnosis not present

## 2020-11-17 DIAGNOSIS — I480 Paroxysmal atrial fibrillation: Secondary | ICD-10-CM | POA: Diagnosis not present

## 2020-11-17 LAB — CBC
HCT: 36.7 % (ref 36.0–46.0)
Hemoglobin: 11.6 g/dL — ABNORMAL LOW (ref 12.0–15.0)
MCH: 27.6 pg (ref 26.0–34.0)
MCHC: 31.6 g/dL (ref 30.0–36.0)
MCV: 87.4 fL (ref 80.0–100.0)
Platelets: 251 10*3/uL (ref 150–400)
RBC: 4.2 MIL/uL (ref 3.87–5.11)
RDW: 14.9 % (ref 11.5–15.5)
WBC: 5.1 10*3/uL (ref 4.0–10.5)
nRBC: 0 % (ref 0.0–0.2)

## 2020-11-17 LAB — MAGNESIUM: Magnesium: 1.9 mg/dL (ref 1.7–2.4)

## 2020-11-17 LAB — BASIC METABOLIC PANEL
Anion gap: 7 (ref 5–15)
BUN: 16 mg/dL (ref 8–23)
CO2: 28 mmol/L (ref 22–32)
Calcium: 9.2 mg/dL (ref 8.9–10.3)
Chloride: 108 mmol/L (ref 98–111)
Creatinine, Ser: 0.88 mg/dL (ref 0.44–1.00)
GFR, Estimated: >60 mL/min (ref >60)
Glucose, Bld: 130 mg/dL — ABNORMAL HIGH (ref 70–99)
Potassium: 4 mmol/L (ref 3.5–5.1)
Sodium: 143 mmol/L (ref 135–145)

## 2020-11-17 MED ORDER — DILTIAZEM HCL ER COATED BEADS 240 MG PO CP24
240.0000 mg | ORAL_CAPSULE | Freq: Every day | ORAL | 0 refills | Status: DC
  Filled 2020-11-17: qty 30, 30d supply, fill #0

## 2020-11-17 MED ORDER — DILTIAZEM HCL ER COATED BEADS 240 MG PO CP24
240.0000 mg | ORAL_CAPSULE | Freq: Every day | ORAL | Status: DC
  Administered 2020-11-17: 240 mg via ORAL
  Filled 2020-11-17: qty 1

## 2020-11-17 MED ORDER — FLECAINIDE ACETATE 50 MG PO TABS
50.0000 mg | ORAL_TABLET | Freq: Two times a day (BID) | ORAL | 0 refills | Status: DC
Start: 2020-11-17 — End: 2020-12-18
  Filled 2020-11-17: qty 60, 30d supply, fill #0

## 2020-11-17 MED ORDER — FUROSEMIDE 40 MG PO TABS
40.0000 mg | ORAL_TABLET | Freq: Every day | ORAL | 0 refills | Status: DC
  Filled 2020-11-17: qty 30, 30d supply, fill #0

## 2020-11-17 MED ORDER — FLECAINIDE ACETATE 50 MG PO TABS
50.0000 mg | ORAL_TABLET | Freq: Two times a day (BID) | ORAL | Status: DC
  Administered 2020-11-17: 50 mg via ORAL
  Filled 2020-11-17 (×2): qty 1

## 2020-11-17 NOTE — Discharge Summary (Signed)
Physician Discharge Summary  [name] Molina:[id number] DOB: 1946/01/18 DOA: 11/16/2020  PCP: Harlan Stains, MD  Admit date: 11/16/2020 Discharge date: 11/17/2020  Admitted From: Home Disposition:  Home  Recommendations for Outpatient Follow-up:  Follow up with PCP in 1-2 weeks Follow up with Cardiology as scheduled  Discharge Condition:Improved CODE STATUS:DNR Diet recommendation: Heart healthy   Brief/Interim Summary: 75yo with hx COPD, paroxysmal afib on eliquis, diastolic CHF presenting with sob and chest pressure, found to have afib RVR. Cardiology was consulted  Discharge Diagnoses:  Principal Problem:   Atrial fibrillation with RVR ([facility]) Active Problems:   COPD (chronic obstructive pulmonary disease) (HCC)   Pulmonary nodule   Paroxysmal atrial fibrillation (HCC)   Chronic diastolic CHF (congestive heart failure) (HCC)   Chronic respiratory failure with hypoxia (HCC)   Anxiety   Hypokalemia  1. Atrial fibrillation with RVR  - Presents with chest pressure, SOB, and rapid atrial fibrillation with rate 150-170s on home monitor - was continued on cardizem gtt at time of presentation - Continued on Eliquis, continue metoprolol -appreciate input by Cardiology, now remained stable.  -discharge on flecainide. Pt to f/u with Cardiology as outpatient   2. Chest pain  - Presents with acute onset chest pressure and dyspnea in setting of rapid atrial fibrillation - Treated with ASA 325 mg by EMS pta  - Initial troponin normal; CTA negative for PE  - Discomfort resolved with rate-control  - Cont with rate control per above   3. Chronic diastolic CHF  - Appears compensated, EF was preserved in April 2021  - Continue Lasix  as tolerated   4. COPD; chronic hypoxic respiratory failure  - Appears stable   - Continue bronchodilator as needed, supplemental O2   5. Hypokalemia  - Replaced    Discharge Instructions   Allergies as of 11/17/2020       Reactions   Food  Shortness Of Breath   ALLERGY= MUSHROOMS   Tequin Shortness Of Breath   Codeine Nausea And Vomiting   Erythromycin Other (See Comments)   GI UPSET   Levaquin [levofloxacin In D5w] Other (See Comments)   "made me want to die."   Oxycodone-acetaminophen Itching   Wellbutrin [bupropion Hcl] Other (See Comments)   SHAKING   Penicillins Rash   Reaction: Childhood   Prednisone Rash        Medication List     TAKE these medications    acetaminophen 650 MG CR tablet Commonly known as: TYLENOL Take 1,300 mg by mouth every 8 (eight) hours as needed for pain.   albuterol 108 (90 Base) MCG/ACT inhaler Commonly known as: VENTOLIN HFA Inhale 2 puffs into the lungs every 6 (six) hours as needed for wheezing or shortness of breath.   alclomethasone 0.05 % cream Commonly known as: ACLOVATE Apply topically 2 (two) times daily as needed (Rash).   aspirin EC 81 MG tablet Take 81 mg by mouth at bedtime. Swallow whole.   Besivance 0.6 % Susp Generic drug: Besifloxacin HCl Place 1 drop into the left eye See admin instructions. Instill 1 drop qid day of eye injection and qid the day after.   cetirizine 10 MG tablet Commonly known as: ZYRTEC Take 1 tablet (10 mg total) by mouth daily.   diltiazem 240 MG 24 hr capsule Commonly known as: CARDIZEM CD Take 1 capsule (240 mg total) by mouth daily. Start taking on: November 18, 2020   DULoxetine 60 MG capsule Commonly known as: CYMBALTA Take 60 mg by mouth  every morning.   Eliquis 5 MG Tabs tablet Generic drug: apixaban Take 5 mg by mouth 2 (two) times daily.   flecainide 50 MG tablet Commonly known as: TAMBOCOR Take 1 tablet (50 mg total) by mouth every 12 (twelve) hours.   fluticasone 50 MCG/ACT nasal spray Commonly known as: FLONASE Place 1 spray into both nostrils daily. What changed:  when to take this reasons to take this   furosemide 40 MG tablet Commonly known as: LASIX Take 1 tablet (40 mg total) by mouth daily. Start  taking on: November 18, 2020   ipratropium-albuterol 0.5-2.5 (3) MG/3ML Soln Commonly known as: DUONEB INHALE contents of one vial using nebulizer TWICE DAILY What changed: See the new instructions.   LORazepam 0.5 MG tablet Commonly known as: ATIVAN Take 0.5-1 mg by mouth daily as needed for anxiety. Anxiety   metoprolol succinate 50 MG 24 hr tablet Commonly known as: TOPROL-XL TAKE ONE TABLET BY MOUTH EVERYDAY AT BEDTIME What changed: See the new instructions.   montelukast 10 MG tablet Commonly known as: SINGULAIR Take 10 mg by mouth at bedtime.   mupirocin ointment 2 % Commonly known as: BACTROBAN Apply 1 application topically 2 (two) times daily. What changed:  when to take this reasons to take this   omeprazole 20 MG capsule Commonly known as: PRILOSEC Take 20 mg by mouth at bedtime.   ondansetron 4 MG tablet Commonly known as: Zofran Take 1 tablet (4 mg total) by mouth every 8 (eight) hours as needed for nausea or vomiting.   pravastatin 40 MG tablet Commonly known as: PRAVACHOL Take 40 mg by mouth at bedtime.   PreserVision AREDS 2+Multi Vit Caps Take 1 tablet by mouth 2 (two) times daily.   Trelegy Ellipta 100-62.5-25 MCG/INH Aepb Generic drug: Fluticasone-Umeclidin-Vilant INHALE 1 PUFF BY MOUTH EVERY DAY What changed: See the new instructions.   VITAMIN B-12 PO Take 2,500 mcg by mouth daily.        Follow-up Information     Shirley Friar, PA-C Follow up.   Specialty: Physician Assistant Why: on 7/7 at 0920 for post hospital follow up Contact information: [place] [address] [phone]         Harlan Stains, MD Follow up in 1 week(s).   Specialty: Family Medicine Why: Hospital follow up Contact information: [place], [address] [place] [address] [phone]         Jerline Pain, MD .   Specialty: Cardiology Contact information: [phone] [address] [place]  [address] [phone]                Allergies  Allergen Reactions   Food Shortness Of Breath    ALLERGY= MUSHROOMS   Tequin Shortness Of Breath   Codeine Nausea And Vomiting   Erythromycin Other (See Comments)    GI UPSET   Levaquin [Levofloxacin In D5w] Other (See Comments)    "made me want to die."   Oxycodone-Acetaminophen Itching   Wellbutrin [Bupropion Hcl] Other (See Comments)    SHAKING   Penicillins Rash    Reaction: Childhood   Prednisone Rash    Consultations: Cardiology  Procedures/Studies: CT Angio Chest PE W and/or Wo Contrast  Result Date: 11/16/2020 CLINICAL DATA:  75 year old female with chest pain, shortness of breath. Atrial fibrillation with RVR. EXAM: CT ANGIOGRAPHY CHEST WITH CONTRAST TECHNIQUE: Multidetector CT imaging of the chest was performed using the standard protocol during bolus administration of intravenous  contrast. Multiplanar CT image reconstructions and MIPs were obtained to evaluate the vascular anatomy. CONTRAST:  60mL OMNIPAQUE IOHEXOL 350 MG/ML SOLN COMPARISON:  Chest CTA 11/26/2017.  Portable chest 0401 hours today. Thyroid ultrasound and biopsy 08/04/2003. FINDINGS: Cardiovascular: Good contrast bolus timing in the pulmonary arterial tree. Mild respiratory motion. No focal filling defect identified in the pulmonary arteries to suggest acute pulmonary embolism. No cardiomegaly or pericardial effusion. Calcified aortic atherosclerosis. Little contrast in the aorta today. Faint calcified coronary artery atherosclerosis on series 7, image 224. Mediastinum/Nodes: Chronic thyromegaly This has been evaluated on previous imaging. (ref: J Am Coll Radiol. 2015 Feb;12(2): 143-50).Bilateral mediastinal lymph nodes have enlarged since 2019, now up to 13 mm in the AP window, 15 mm precarinal (previously subcentimeter). Similar mild enlargement of right hilar nodes. Lungs/Pleura: Major airways are patent. Similar lung volumes to 2019. Chronic bilateral lower  lobe curvilinear scarring and atelectasis has not significantly changed. No pleural effusion. Mosaic attenuation elsewhere in the lungs likely due to gas trapping. No consolidation or inflammatory pulmonary opacity identified. No pleural effusion. The small 5 mm posterior right upper lobe nodule seen in 2019 does not persist. No pulmonary nodule identified today. Upper Abdomen: Negative visible liver, gallbladder, spleen, pancreas, adrenal glands, kidneys, and bowel. Musculoskeletal: Partially visible cervical ACDF. No acute or suspicious osseous lesion identified. Review of the MIP images confirms the above findings. IMPRESSION: 1. No evidence of acute pulmonary embolus. 2. Mild nonspecific mediastinal lymphadenopathy, new since 2019. The affected lymph nodes appear reactive, but no corresponding pulmonary infectious or inflammatory process is identified. A lymphoproliferative disorder cannot be excluded. 3. Chronic lower lobe lung scarring and mild pulmonary gas trapping. A small 5 mm right upper lobe nodule seen in 2019 has resolved. 4. Aortic Atherosclerosis (ICD10-I70.0). Electronically Signed   By: Genevie Ann M.D.   On: 11/16/2020 05:52   DG Chest Portable 1 View  Result Date: 11/16/2020 CLINICAL DATA:  Atrial fibrillation EXAM: PORTABLE CHEST 1 VIEW COMPARISON:  08/13/2020 FINDINGS: 15 mm nodular opacity is seen at the right lung base, new from prior examination. Minimal left basilar atelectasis or scarring. No superimposed confluent pulmonary infiltrate. No pneumothorax or pleural effusion. Cardiac size within normal limits. Pulmonary vascularity is normal. No acute bone abnormality. IMPRESSION: No radiographic evidence of acute cardiopulmonary disease. New 15 mm nodular opacity at the right lung base. This would be better assessed with a nonemergent standard two view chest radiograph once the patient's acute issues have resolved. Electronically Signed   By: Fidela Salisbury MD   On: 11/16/2020 04:13    ECHOCARDIOGRAM COMPLETE  Result Date: 11/16/2020    ECHOCARDIOGRAM REPORT   Patient Name:   [name] Molina Date of Exam: 11/16/2020 Medical Rec #:  [id number]     Height:       62.0 in Accession #:    [id number]    Weight:       214.1 lb Date of Birth:  11/27/1945      BSA:          1.968 m Patient Age:    71 years      BP:           113/77 mmHg Patient Gender: F             HR:           116 bpm. Exam Location:  Inpatient Procedure: 2D Echo, Cardiac Doppler and Color Doppler Indications:    I48.91* Unspeicified atrial fibrillation  History:  Patient has prior history of Echocardiogram examinations, most                 recent 09/16/2019. CHF, Abnormal ECG, COPD,                 Arrythmias:Tachycardia and Atrial Fibrillation,                 Signs/Symptoms:Shortness of Breath and Dyspnea; Risk                 Factors:Hypertension and Dyslipidemia. Edema.  Sonographer:    Roseanna Rainbow RDCS Referring Phys: [id number] Crocker  Sonographer Comments: Technically difficult study due to poor echo windows and patient is morbidly obese. Image acquisition challenging due to patient body habitus. IMPRESSIONS  1. Afib with RVR noted during the study.  2. Left ventricular ejection fraction, by estimation, is 65 to 70%. The left ventricle has normal function. The left ventricle has no regional wall motion abnormalities. There is moderate concentric left ventricular hypertrophy. Left ventricular diastolic function could not be evaluated.  3. Prominent RV epicardial fat. Right ventricular systolic function is normal. The right ventricular size is normal. Tricuspid regurgitation signal is inadequate for assessing PA pressure.  4. Left atrial size was mild to moderately dilated.  5. The mitral valve is degenerative. Trivial mitral valve regurgitation. Mild mitral stenosis. The mean mitral valve gradient is 5.3 mmHg with average heart rate of 106 bpm. Severe mitral annular calcification.  6. The aortic valve is grossly  normal. Aortic valve regurgitation is not visualized. No aortic stenosis is present.  7. The inferior vena cava is dilated in size with >50% respiratory variability, suggesting right atrial pressure of 8 mmHg. Comparison(s): No significant change from prior study. FINDINGS  Left Ventricle: Left ventricular ejection fraction, by estimation, is 65 to 70%. The left ventricle has normal function. The left ventricle has no regional wall motion abnormalities. The left ventricular internal cavity size was normal in size. There is  moderate concentric left ventricular hypertrophy. Left ventricular diastolic function could not be evaluated due to atrial fibrillation. Left ventricular diastolic function could not be evaluated. Right Ventricle: Prominent RV epicardial fat. The right ventricular size is normal. No increase in right ventricular wall thickness. Right ventricular systolic function is normal. Tricuspid regurgitation signal is inadequate for assessing PA pressure. Left Atrium: Left atrial size was mild to moderately dilated. Right Atrium: Right atrial size was normal in size. Pericardium: Trivial pericardial effusion is present. Presence of pericardial fat pad. Mitral Valve: The mitral valve is degenerative in appearance. Severe mitral annular calcification. Trivial mitral valve regurgitation. Mild mitral valve stenosis. MV peak gradient, 11.6 mmHg. The mean mitral valve gradient is 5.3 mmHg with average heart rate of 106 bpm. Tricuspid Valve: The tricuspid valve is grossly normal. Tricuspid valve regurgitation is trivial. No evidence of tricuspid stenosis. Aortic Valve: The aortic valve is grossly normal. Aortic valve regurgitation is not visualized. No aortic stenosis is present. Pulmonic Valve: The pulmonic valve was grossly normal. Pulmonic valve regurgitation is not visualized. No evidence of pulmonic stenosis. Aorta: The aortic root and ascending aorta are structurally normal, with no evidence of dilitation.  Venous: The inferior vena cava is dilated in size with greater than 50% respiratory variability, suggesting right atrial pressure of 8 mmHg. IAS/Shunts: The atrial septum is grossly normal.  LEFT VENTRICLE PLAX 2D LVIDd:         2.90 cm     Diastology LVIDs:  2.00 cm     LV e' medial:    4.90 cm/s LV PW:         1.30 cm     LV E/e' medial:  31.5 LV IVS:        1.40 cm     LV e' lateral:   6.86 cm/s LVOT diam:     1.70 cm     LV E/e' lateral: 22.5 LV SV:         47 LV SV Index:   24 LVOT Area:     2.27 cm  LV Volumes (MOD) LV vol d, MOD A2C: 32.4 ml LV vol d, MOD A4C: 41.5 ml LV vol s, MOD A2C: 10.9 ml LV vol s, MOD A4C: 11.2 ml LV SV MOD A2C:     21.5 ml LV SV MOD A4C:     41.5 ml LV SV MOD BP:      26.8 ml RIGHT VENTRICLE             IVC RV S prime:     11.50 cm/s  IVC diam: 2.40 cm TAPSE (M-mode): 1.0 cm LEFT ATRIUM             Index       RIGHT ATRIUM          Index LA diam:        4.50 cm 2.29 cm/m  RA Area:     9.66 cm LA Vol (A2C):   58.8 ml 29.88 ml/m RA Volume:   19.20 ml 9.76 ml/m LA Vol (A4C):   49.9 ml 25.36 ml/m LA Biplane Vol: 59.5 ml 30.24 ml/m  AORTIC VALVE LVOT Vmax:   132.00 cm/s LVOT Vmean:  86.500 cm/s LVOT VTI:    0.209 m  AORTA Ao Root diam: 3.30 cm Ao Asc diam:  3.70 cm MITRAL VALVE MV Area (PHT): 3.88 cm     SHUNTS MV Area VTI:   1.33 cm     Systemic VTI:  0.21 m MV Peak grad:  11.6 mmHg    Systemic Diam: 1.70 cm MV Mean grad:  5.3 mmHg MV Vmax:       1.70 m/s MV Vmean:      100.6 cm/s MV VTI:        0.36 m MV Decel Time: 196 msec MV E velocity: 154.25 cm/s Eleonore Chiquito MD Electronically signed by Eleonore Chiquito MD Signature Date/Time: 11/16/2020/5:48:25 PM    Final     Subjective: Eager to go home  Discharge Exam: Vitals:   11/17/20 0805 11/17/20 0834  BP:  (!) 114/50  Pulse:  74  Resp:    Temp:  97.8 F (36.6 C)  SpO2: 92% 95%   Vitals:   11/17/20 0419 11/17/20 0637 11/17/20 0805 11/17/20 0834  BP: (!) 98/58 (!) 118/57  (!) 114/50  Pulse: 73 72  74  Resp: 15      Temp: 98.1 F (36.7 C)   97.8 F (36.6 C)  TempSrc: Oral   Oral  SpO2: 93%  92% 95%  Weight: 96.8 kg     Height:        General: Pt is alert, awake, not in acute distress Cardiovascular: RRR, S1/S2 + Respiratory: CTA bilaterally, no wheezing, no rhonchi Abdominal: Soft, NT, ND, bowel sounds + Extremities: no edema, no cyanosis   The results of significant diagnostics from this hospitalization (including imaging, microbiology, ancillary and laboratory) are listed below for reference.     Microbiology: Recent Results (from the  past 240 hour(s))  Resp Panel by RT-PCR (Flu A&B, Covid) Nasopharyngeal Swab     Status: None   Collection Time: 11/16/20  4:06 AM   Specimen: Nasopharyngeal Swab; Nasopharyngeal(NP) swabs in vial transport medium  Result Value Ref Range Status   SARS Coronavirus 2 by RT PCR NEGATIVE NEGATIVE Final    Comment: (NOTE) SARS-CoV-2 target nucleic acids are NOT DETECTED.  The SARS-CoV-2 RNA is generally detectable in upper respiratory specimens during the acute phase of infection. The lowest concentration of SARS-CoV-2 viral copies this assay can detect is 138 copies/mL. A negative result does not preclude SARS-Cov-2 infection and should not be used as the sole basis for treatment or other patient management decisions. A negative result may occur with  improper specimen collection/handling, submission of specimen other than nasopharyngeal swab, presence of viral mutation(s) within the areas targeted by this assay, and inadequate number of viral copies(<138 copies/mL). A negative result must be combined with clinical observations, patient history, and epidemiological information. The expected result is Negative.  Fact Sheet for Patients:  EntrepreneurPulse.com.au  Fact Sheet for Healthcare Providers:  IncredibleEmployment.be  This test is no t yet approved or cleared by the [place] FDA and  has been  authorized for detection and/or diagnosis of SARS-CoV-2 by FDA under an Emergency Use Authorization (EUA). This EUA will remain  in effect (meaning this test can be used) for the duration of the COVID-19 declaration under Section 564(b)(1) of the Act, 21 U.S.C.section 360bbb-3(b)(1), unless the authorization is terminated  or revoked sooner.       Influenza A by PCR NEGATIVE NEGATIVE Final   Influenza B by PCR NEGATIVE NEGATIVE Final    Comment: (NOTE) The Xpert Xpress SARS-CoV-2/FLU/RSV plus assay is intended as an aid in the diagnosis of influenza from Nasopharyngeal swab specimens and should not be used as a sole basis for treatment. Nasal washings and aspirates are unacceptable for Xpert Xpress SARS-CoV-2/FLU/RSV testing.  Fact Sheet for Patients: EntrepreneurPulse.com.au  Fact Sheet for Healthcare Providers: IncredibleEmployment.be  This test is not yet approved or cleared by the [place] FDA and has been authorized for detection and/or diagnosis of SARS-CoV-2 by FDA under an Emergency Use Authorization (EUA). This EUA will remain in effect (meaning this test can be used) for the duration of the COVID-19 declaration under Section 564(b)(1) of the Act, 21 U.S.C. section 360bbb-3(b)(1), unless the authorization is terminated or revoked.  Performed at [facility], [place] [address] [place], [place] [address]      Labs: BNP (last 3 results) Recent Labs    03/01/20 2205 08/13/20 0150  BNP 238.2* 144.3*   Basic Metabolic Panel: Recent Labs  Lab 11/16/20 0356 11/16/20 0406 11/16/20 0642 11/17/20 0314  NA 141 143  --  143  K 3.3* 3.2*  --  4.0  CL 106 106  --  108  CO2 23  --   --  28  GLUCOSE 126* 123*  --  130*  BUN 18 20  --  16  CREATININE 0.74 0.70  --  0.88  CALCIUM 9.2  --   --  9.2  MG  --   --  1.8 1.9   Liver Function Tests: Recent Labs  Lab 11/16/20 0356  AST 20  ALT 14  ALKPHOS 75  BILITOT 0.7   PROT 6.2*  ALBUMIN 3.6   No results for input(s): LIPASE, AMYLASE in the last 168 hours. No results for input(s): AMMONIA in the last 168 hours. CBC:  Recent Labs  Lab 11/16/20 0356 11/16/20 0406 11/17/20 0314  WBC 5.7  --  5.1  NEUTROABS 3.5  --   --   HGB 12.5 12.2 11.6*  HCT 39.2 36.0 36.7  MCV 86.3  --  87.4  PLT 242  --  251   Cardiac Enzymes: No results for input(s): CKTOTAL, CKMB, CKMBINDEX, TROPONINI in the last 168 hours. BNP: Invalid input(s): POCBNP CBG: No results for input(s): GLUCAP in the last 168 hours. D-Dimer No results for input(s): DDIMER in the last 72 hours. Hgb A1c No results for input(s): HGBA1C in the last 72 hours. Lipid Profile No results for input(s): CHOL, HDL, LDLCALC, TRIG, CHOLHDL, LDLDIRECT in the last 72 hours. Thyroid function studies No results for input(s): TSH, T4TOTAL, T3FREE, THYROIDAB in the last 72 hours.  Invalid input(s): FREET3 Anemia work up No results for input(s): VITAMINB12, FOLATE, FERRITIN, TIBC, IRON, RETICCTPCT in the last 72 hours. Urinalysis    Component Value Date/Time   COLORURINE AMBER (A) 06/14/2020 1145   APPEARANCEUR CLOUDY (A) 06/14/2020 1145   LABSPEC 1.027 06/14/2020 1145   PHURINE 5.0 06/14/2020 1145   GLUCOSEU NEGATIVE 06/14/2020 1145   HGBUR NEGATIVE 06/14/2020 1145   BILIRUBINUR NEGATIVE 06/14/2020 1145   KETONESUR NEGATIVE 06/14/2020 1145   PROTEINUR NEGATIVE 06/14/2020 1145   NITRITE NEGATIVE 06/14/2020 1145   LEUKOCYTESUR TRACE (A) 06/14/2020 1145   Sepsis Labs Invalid input(s): PROCALCITONIN,  WBC,  LACTICIDVEN Microbiology Recent Results (from the past 240 hour(s))  Resp Panel by RT-PCR (Flu A&B, Covid) Nasopharyngeal Swab     Status: None   Collection Time: 11/16/20  4:06 AM   Specimen: Nasopharyngeal Swab; Nasopharyngeal(NP) swabs in vial transport medium  Result Value Ref Range Status   SARS Coronavirus 2 by RT PCR NEGATIVE NEGATIVE Final    Comment: (NOTE) SARS-CoV-2 target  nucleic acids are NOT DETECTED.  The SARS-CoV-2 RNA is generally detectable in upper respiratory specimens during the acute phase of infection. The lowest concentration of SARS-CoV-2 viral copies this assay can detect is 138 copies/mL. A negative result does not preclude SARS-Cov-2 infection and should not be used as the sole basis for treatment or other patient management decisions. A negative result may occur with  improper specimen collection/handling, submission of specimen other than nasopharyngeal swab, presence of viral mutation(s) within the areas targeted by this assay, and inadequate number of viral copies(<138 copies/mL). A negative result must be combined with clinical observations, patient history, and epidemiological information. The expected result is Negative.  Fact Sheet for Patients:  EntrepreneurPulse.com.au  Fact Sheet for Healthcare Providers:  IncredibleEmployment.be  This test is no t yet approved or cleared by the [place] FDA and  has been authorized for detection and/or diagnosis of SARS-CoV-2 by FDA under an Emergency Use Authorization (EUA). This EUA will remain  in effect (meaning this test can be used) for the duration of the COVID-19 declaration under Section 564(b)(1) of the Act, 21 U.S.C.section 360bbb-3(b)(1), unless the authorization is terminated  or revoked sooner.       Influenza A by PCR NEGATIVE NEGATIVE Final   Influenza B by PCR NEGATIVE NEGATIVE Final    Comment: (NOTE) The Xpert Xpress SARS-CoV-2/FLU/RSV plus assay is intended as an aid in the diagnosis of influenza from Nasopharyngeal swab specimens and should not be used as a sole basis for treatment. Nasal washings and aspirates are unacceptable for Xpert Xpress SARS-CoV-2/FLU/RSV testing.  Fact Sheet for Patients: EntrepreneurPulse.com.au  Fact Sheet for Healthcare  Providers: IncredibleEmployment.be  This test is not yet approved or cleared by the [place] and has been authorized for detection and/or diagnosis of SARS-CoV-2 by FDA under an Emergency Use Authorization (EUA). This EUA will remain in effect (meaning this test can be used) for the duration of the COVID-19 declaration under Section 564(b)(1) of the Act, 21 U.S.C. section 360bbb-3(b)(1), unless the authorization is terminated or revoked.  Performed at [facility], [place] [address], [place], [place] [address]    Time spent: 30 min  SIGNED:   Marylu Lund, MD  [facility] 11/17/2020, 10:47 AM  If 7PM-7AM, please contact night-coverage

## 2020-11-17 NOTE — Care Management Obs Status (Signed)
MEDICARE OBSERVATION STATUS NOTIFICATION   Patient Details  Name: [name] Molina MRN: [id number] Date of Birth: 1945-09-21   Medicare Observation Status Notification Given:  Yes    Zenon Mayo, RN 11/17/2020, 11:28 AM

## 2020-11-17 NOTE — Progress Notes (Addendum)
Electrophysiology Rounding Note  Patient Name: [name] Molina Date of Encounter: 11/17/2020  Primary Cardiologist: Candee Furbish, MD Electrophysiologist: New to Dr Curt Bears   Subjective   The patient is doing well today.  She converted to NSR overnight. At this time, the patient denies chest pain, shortness of breath, or any new concerns.  Inpatient Medications    Scheduled Meds:  apixaban  5 mg Oral BID   diltiazem  240 mg Oral Daily   flecainide  50 mg Oral Q12H   fluticasone furoate-vilanterol  1 puff Inhalation Daily   And   umeclidinium bromide  1 puff Inhalation Daily   furosemide  40 mg Oral Daily   metoprolol succinate  50 mg Oral QHS   montelukast  10 mg Oral QHS   pravastatin  40 mg Oral QHS   Continuous Infusions:  PRN Meds: acetaminophen, albuterol, ondansetron (ZOFRAN) IV   Vital Signs    Vitals:   11/16/20 2057 11/17/20 0035 11/17/20 0419 11/17/20 0637  BP: 108/72 101/66 (!) 98/58 (!) 118/57  Pulse: 85 79 73 72  Resp:  16 15   Temp:  98.1 F (36.7 C) 98.1 F (36.7 C)   TempSrc:  Oral Oral   SpO2:  94% 93%   Weight:   96.8 kg   Height:        Intake/Output Summary (Last 24 hours) at 11/17/2020 0737 Last data filed at 11/17/2020 0440 Gross per 24 hour  Intake 900 ml  Output 1300 ml  Net -400 ml   Filed Weights   11/16/20 1049 11/17/20 0419  Weight: 97.1 kg 96.8 kg    Physical Exam    GEN- The patient is well appearing, alert and oriented x 3 today.   Head- normocephalic, atraumatic Eyes-  Sclera clear, conjunctiva pink Ears- hearing intact Oropharynx- clear Neck- supple Lungs- Clear to ausculation bilaterally, normal work of breathing Heart- Regular rate and rhythm, no murmurs, rubs or gallops GI- soft, NT, ND, + BS Extremities- no clubbing or cyanosis. No edema Skin- no rash or lesion Psych- euthymic mood, full affect Neuro- strength and sensation are intact  Labs    CBC Recent Labs    11/16/20 0356 11/16/20 0406 11/17/20 0314   WBC 5.7  --  5.1  NEUTROABS 3.5  --   --   HGB 12.5 12.2 11.6*  HCT 39.2 36.0 36.7  MCV 86.3  --  87.4  PLT 242  --  253   Basic Metabolic Panel Recent Labs    11/16/20 0356 11/16/20 0406 11/16/20 0642 11/17/20 0314  NA 141 143  --  143  K 3.3* 3.2*  --  4.0  CL 106 106  --  108  CO2 23  --   --  28  GLUCOSE 126* 123*  --  130*  BUN 18 20  --  16  CREATININE 0.74 0.70  --  0.88  CALCIUM 9.2  --   --  9.2  MG  --   --  1.8 1.9   Liver Function Tests Recent Labs    11/16/20 0356  AST 20  ALT 14  ALKPHOS 75  BILITOT 0.7  PROT 6.2*  ALBUMIN 3.6   No results for input(s): LIPASE, AMYLASE in the last 72 hours. Cardiac Enzymes No results for input(s): CKTOTAL, CKMB, CKMBINDEX, TROPONINI in the last 72 hours.   Telemetry    NSR, converted to NSR overnight from AF (personally reviewed)  Radiology    CT Angio Chest PE  W and/or Wo Contrast  Result Date: 11/16/2020 CLINICAL DATA:  75 year old female with chest pain, shortness of breath. Atrial fibrillation with RVR. EXAM: CT ANGIOGRAPHY CHEST WITH CONTRAST TECHNIQUE: Multidetector CT imaging of the chest was performed using the standard protocol during bolus administration of intravenous contrast. Multiplanar CT image reconstructions and MIPs were obtained to evaluate the vascular anatomy. CONTRAST:  75mL OMNIPAQUE IOHEXOL 350 MG/ML SOLN COMPARISON:  Chest CTA 11/26/2017.  Portable chest 0401 hours today. Thyroid ultrasound and biopsy 08/04/2003. FINDINGS: Cardiovascular: Good contrast bolus timing in the pulmonary arterial tree. Mild respiratory motion. No focal filling defect identified in the pulmonary arteries to suggest acute pulmonary embolism. No cardiomegaly or pericardial effusion. Calcified aortic atherosclerosis. Little contrast in the aorta today. Faint calcified coronary artery atherosclerosis on series 7, image 224. Mediastinum/Nodes: Chronic thyromegaly This has been evaluated on previous imaging. (ref: J Am Coll  Radiol. 2015 Feb;12(2): 143-50).Bilateral mediastinal lymph nodes have enlarged since 2019, now up to 13 mm in the AP window, 15 mm precarinal (previously subcentimeter). Similar mild enlargement of right hilar nodes. Lungs/Pleura: Major airways are patent. Similar lung volumes to 2019. Chronic bilateral lower lobe curvilinear scarring and atelectasis has not significantly changed. No pleural effusion. Mosaic attenuation elsewhere in the lungs likely due to gas trapping. No consolidation or inflammatory pulmonary opacity identified. No pleural effusion. The small 5 mm posterior right upper lobe nodule seen in 2019 does not persist. No pulmonary nodule identified today. Upper Abdomen: Negative visible liver, gallbladder, spleen, pancreas, adrenal glands, kidneys, and bowel. Musculoskeletal: Partially visible cervical ACDF. No acute or suspicious osseous lesion identified. Review of the MIP images confirms the above findings. IMPRESSION: 1. No evidence of acute pulmonary embolus. 2. Mild nonspecific mediastinal lymphadenopathy, new since 2019. The affected lymph nodes appear reactive, but no corresponding pulmonary infectious or inflammatory process is identified. A lymphoproliferative disorder cannot be excluded. 3. Chronic lower lobe lung scarring and mild pulmonary gas trapping. A small 5 mm right upper lobe nodule seen in 2019 has resolved. 4. Aortic Atherosclerosis (ICD10-I70.0). Electronically Signed   By: Genevie Ann M.D.   On: 11/16/2020 05:52   DG Chest Portable 1 View  Result Date: 11/16/2020 CLINICAL DATA:  Atrial fibrillation EXAM: PORTABLE CHEST 1 VIEW COMPARISON:  08/13/2020 FINDINGS: 15 mm nodular opacity is seen at the right lung base, new from prior examination. Minimal left basilar atelectasis or scarring. No superimposed confluent pulmonary infiltrate. No pneumothorax or pleural effusion. Cardiac size within normal limits. Pulmonary vascularity is normal. No acute bone abnormality. IMPRESSION: No  radiographic evidence of acute cardiopulmonary disease. New 15 mm nodular opacity at the right lung base. This would be better assessed with a nonemergent standard two view chest radiograph once the patient's acute issues have resolved. Electronically Signed   By: Fidela Salisbury MD   On: 11/16/2020 04:13   ECHOCARDIOGRAM COMPLETE  Result Date: 11/16/2020    ECHOCARDIOGRAM REPORT   Patient Name:   [name] Molina Date of Exam: 11/16/2020 Medical Rec #:  [id number]     Height:       62.0 in Accession #:    [id number]    Weight:       214.1 lb Date of Birth:  06-30-1945      BSA:          1.968 m Patient Age:    75 years      BP:           113/77 mmHg Patient Gender: F  HR:           116 bpm. Exam Location:  Inpatient Procedure: 2D Echo, Cardiac Doppler and Color Doppler Indications:    I48.91* Unspeicified atrial fibrillation  History:        Patient has prior history of Echocardiogram examinations, most                 recent 09/16/2019. CHF, Abnormal ECG, COPD,                 Arrythmias:Tachycardia and Atrial Fibrillation,                 Signs/Symptoms:Shortness of Breath and Dyspnea; Risk                 Factors:Hypertension and Dyslipidemia. Edema.  Sonographer:    Roseanna Rainbow RDCS Referring Phys: [id number] [place]  Sonographer Comments: Technically difficult study due to poor echo windows and patient is morbidly obese. Image acquisition challenging due to patient body habitus. IMPRESSIONS  1. Afib with RVR noted during the study.  2. Left ventricular ejection fraction, by estimation, is 65 to 70%. The left ventricle has normal function. The left ventricle has no regional wall motion abnormalities. There is moderate concentric left ventricular hypertrophy. Left ventricular diastolic function could not be evaluated.  3. Prominent RV epicardial fat. Right ventricular systolic function is normal. The right ventricular size is normal. Tricuspid regurgitation signal is inadequate for assessing PA  pressure.  4. Left atrial size was mild to moderately dilated.  5. The mitral valve is degenerative. Trivial mitral valve regurgitation. Mild mitral stenosis. The mean mitral valve gradient is 5.3 mmHg with average heart rate of 106 bpm. Severe mitral annular calcification.  6. The aortic valve is grossly normal. Aortic valve regurgitation is not visualized. No aortic stenosis is present.  7. The inferior vena cava is dilated in size with >50% respiratory variability, suggesting right atrial pressure of 8 mmHg. Comparison(s): No significant change from prior study. FINDINGS  Left Ventricle: Left ventricular ejection fraction, by estimation, is 65 to 70%. The left ventricle has normal function. The left ventricle has no regional wall motion abnormalities. The left ventricular internal cavity size was normal in size. There is  moderate concentric left ventricular hypertrophy. Left ventricular diastolic function could not be evaluated due to atrial fibrillation. Left ventricular diastolic function could not be evaluated. Right Ventricle: Prominent RV epicardial fat. The right ventricular size is normal. No increase in right ventricular wall thickness. Right ventricular systolic function is normal. Tricuspid regurgitation signal is inadequate for assessing PA pressure. Left Atrium: Left atrial size was mild to moderately dilated. Right Atrium: Right atrial size was normal in size. Pericardium: Trivial pericardial effusion is present. Presence of pericardial fat pad. Mitral Valve: The mitral valve is degenerative in appearance. Severe mitral annular calcification. Trivial mitral valve regurgitation. Mild mitral valve stenosis. MV peak gradient, 11.6 mmHg. The mean mitral valve gradient is 5.3 mmHg with average heart rate of 106 bpm. Tricuspid Valve: The tricuspid valve is grossly normal. Tricuspid valve regurgitation is trivial. No evidence of tricuspid stenosis. Aortic Valve: The aortic valve is grossly normal. Aortic  valve regurgitation is not visualized. No aortic stenosis is present. Pulmonic Valve: The pulmonic valve was grossly normal. Pulmonic valve regurgitation is not visualized. No evidence of pulmonic stenosis. Aorta: The aortic root and ascending aorta are structurally normal, with no evidence of dilitation. Venous: The inferior vena cava is dilated in size with greater than 50% respiratory  variability, suggesting right atrial pressure of 8 mmHg. IAS/Shunts: The atrial septum is grossly normal.  LEFT VENTRICLE PLAX 2D LVIDd:         2.90 cm     Diastology LVIDs:         2.00 cm     LV e' medial:    4.90 cm/s LV PW:         1.30 cm     LV E/e' medial:  31.5 LV IVS:        1.40 cm     LV e' lateral:   6.86 cm/s LVOT diam:     1.70 cm     LV E/e' lateral: 22.5 LV SV:         47 LV SV Index:   24 LVOT Area:     2.27 cm  LV Volumes (MOD) LV vol d, MOD A2C: 32.4 ml LV vol d, MOD A4C: 41.5 ml LV vol s, MOD A2C: 10.9 ml LV vol s, MOD A4C: 11.2 ml LV SV MOD A2C:     21.5 ml LV SV MOD A4C:     41.5 ml LV SV MOD BP:      26.8 ml RIGHT VENTRICLE             IVC RV S prime:     11.50 cm/s  IVC diam: 2.40 cm TAPSE (M-mode): 1.0 cm LEFT ATRIUM             Index       RIGHT ATRIUM          Index LA diam:        4.50 cm 2.29 cm/m  RA Area:     9.66 cm LA Vol (A2C):   58.8 ml 29.88 ml/m RA Volume:   19.20 ml 9.76 ml/m LA Vol (A4C):   49.9 ml 25.36 ml/m LA Biplane Vol: 59.5 ml 30.24 ml/m  AORTIC VALVE LVOT Vmax:   132.00 cm/s LVOT Vmean:  86.500 cm/s LVOT VTI:    0.209 m  AORTA Ao Root diam: 3.30 cm Ao Asc diam:  3.70 cm MITRAL VALVE MV Area (PHT): 3.88 cm     SHUNTS MV Area VTI:   1.33 cm     Systemic VTI:  0.21 m MV Peak grad:  11.6 mmHg    Systemic Diam: 1.70 cm MV Mean grad:  5.3 mmHg MV Vmax:       1.70 m/s MV Vmean:      100.6 cm/s MV VTI:        0.36 m MV Decel Time: 196 msec MV E velocity: 154.25 cm/s Eleonore Chiquito MD Electronically signed by Eleonore Chiquito MD Signature Date/Time: 11/16/2020/5:48:25 PM    Final      Patient Profile     [name] Molina is a 75 y.o. female with a history of COPD, chronic hypoxic respiratory failure, PAF on Eliquis, chronic diastolic CHF, and anxiety who is being seen today for the evaluation of AF with RVR at the request of Dr. Radford Pax.  Assessment & Plan    1.  Paroxysmal atrial fibrillation with RVR Missed dose of Eliquis 5 mg BID last week CHA2DS2/VASc is at least 5. She converted overnight on metoprolol and po diltiazem Consolidate diltiazem to 240 mg daily.  Continue toprol.  Keep K > 4.0 and Mg > 2.0 Poor candidate for amio with neuropathy and COPD Since she converted to NSR, Vaudine Dutan start flecainide 50 mg BID and follow up next week for EKG  2. Chronic diastolic CHF Echo 05/624 LVEF 65-70% Update echo 6/30 shows LVEF remains 65-70%, mild/mod LAE, severe mitral annular calcification   3. Hypokalemia K 4.0 and Mg 1.9 today.   She is OK for discharge from an EP perspective on  Diltiazem 240 mg daily Toprol 50 mg daily Eliquis 5 mg BID Flecainide 50 mg BID  For questions or updates, please contact [facility] Please consult [email or web address] for contact info under Cardiology/STEMI.  Signed, Shirley Friar, PA-C  11/17/2020, 7:37 AM   I have seen and examined this patient with Oda Kilts.  Agree with above, note added to reflect my findings.  On exam, RRR, no murmurs. Converted to sinus rhythm. Cathryn Gallery start flecainide today. Have arranged follow up in clinic.  Would discharge on flecainide 50 mg BID, home metoprolol and diltiazem 240 mg. EP to sign off.  Audon Heymann M. Campbell Agramonte MD 11/17/2020 2:55 PM

## 2020-11-21 ENCOUNTER — Telehealth: Payer: Self-pay | Admitting: *Deleted

## 2020-11-21 ENCOUNTER — Other Ambulatory Visit: Payer: Self-pay | Admitting: *Deleted

## 2020-11-21 DIAGNOSIS — I4891 Unspecified atrial fibrillation: Secondary | ICD-10-CM

## 2020-11-21 DIAGNOSIS — I471 Supraventricular tachycardia: Secondary | ICD-10-CM

## 2020-11-21 DIAGNOSIS — I480 Paroxysmal atrial fibrillation: Secondary | ICD-10-CM

## 2020-11-21 DIAGNOSIS — Z79899 Other long term (current) drug therapy: Secondary | ICD-10-CM

## 2020-11-21 NOTE — Telephone Encounter (Signed)
The patient has been notified of the result and verbalized understanding.  All questions (if any) were answered. Marolyn Hammock, [place] 11/21/2020 10:59 AM   LMTCB

## 2020-11-21 NOTE — Telephone Encounter (Signed)
-----   Message from Sueanne Margarita, MD sent at 11/15/2020 10:31 PM EDT ----- Normal home sleep study so in lab PSG will be ordered

## 2020-11-21 NOTE — Telephone Encounter (Signed)
Will close encounter, as pt was recently hospitalized and labs and med changes have been made.

## 2020-11-23 ENCOUNTER — Other Ambulatory Visit: Payer: Self-pay

## 2020-11-23 ENCOUNTER — Encounter: Payer: Self-pay | Admitting: Student

## 2020-11-23 ENCOUNTER — Ambulatory Visit (INDEPENDENT_AMBULATORY_CARE_PROVIDER_SITE_OTHER): Payer: Medicare Other | Admitting: Student

## 2020-11-23 VITALS — BP 102/50 | HR 72 | Ht 62.5 in | Wt 216.0 lb

## 2020-11-23 DIAGNOSIS — I10 Essential (primary) hypertension: Secondary | ICD-10-CM | POA: Diagnosis not present

## 2020-11-23 DIAGNOSIS — I5032 Chronic diastolic (congestive) heart failure: Secondary | ICD-10-CM

## 2020-11-23 DIAGNOSIS — Z79899 Other long term (current) drug therapy: Secondary | ICD-10-CM | POA: Diagnosis not present

## 2020-11-23 DIAGNOSIS — I480 Paroxysmal atrial fibrillation: Secondary | ICD-10-CM | POA: Diagnosis not present

## 2020-11-23 LAB — BASIC METABOLIC PANEL
BUN/Creatinine Ratio: 19 (ref 12–28)
BUN: 15 mg/dL (ref 8–27)
CO2: 22 mmol/L (ref 20–29)
Calcium: 9.6 mg/dL (ref 8.7–10.3)
Chloride: 105 mmol/L (ref 96–106)
Creatinine, Ser: 0.8 mg/dL (ref 0.57–1.00)
Glucose: 149 mg/dL — ABNORMAL HIGH (ref 65–99)
Potassium: 3.6 mmol/L (ref 3.5–5.2)
Sodium: 144 mmol/L (ref 134–144)
eGFR: 76 mL/min/{1.73_m2} (ref >59)

## 2020-11-23 NOTE — Patient Instructions (Signed)
Medication Instructions:  Your physician recommends that you continue on your current medications as directed. Please refer to the Current Medication list given to you today.  *If you need a refill on your cardiac medications before your next appointment, please call your pharmacy*   Lab Work: TODAY: BMET  If you have labs (blood work) drawn today and your tests are completely normal, you will receive your results only by: MyChart Message (if you have MyChart) OR A paper copy in the mail If you have any lab test that is abnormal or we need to change your treatment, we will call you to review the results.   Follow-Up: At [facility], you and your health needs are our priority.  As part of our continuing mission to provide you with exceptional heart care, we have created designated Provider Care Teams.  These Care Teams include your primary Cardiologist (physician) and Advanced Practice Providers (APPs -  Physician Assistants and Nurse Practitioners) who all work together to provide you with the care you need, when you need it.  Your next appointment:   As scheduled 

## 2020-11-23 NOTE — Progress Notes (Signed)
PCP:  Harlan Stains, MD Primary Cardiologist: Candee Furbish, MD Electrophysiologist: Will Meredith Leeds, MD   [name] Molina is a 75 y.o. female seen today for Will Meredith Leeds, MD for post hospital follow up.   Admitted 6/30 with AF RVR. Rates controlled on dilt. [facility] deferred as patient had missed Eliquis and no TEE available. Had planned outpatient follow up for AAD and [facility] discussion, but patient converted overnight on diltiazem alone. Flecainide started day of discharge.   Since discharge from hospital the patient reports doing well overall, though more fatigued. She takes naps intermittently throughout the day per her daughter. she denies chest pain, palpitations, PND, orthopnea, nausea, vomiting, dizziness, syncope, edema, weight gain, or early satiety since leaving the hospital. Mild SOB with exertion. Not very active overall.  Past Medical History:  Diagnosis Date   Anxiety    Aortic atherosclerosis (HCC)    Arthritis    Asthma    Atrial tachycardia ([place])    Cancer (HCC)    basal skin cancer on nose   Cataract    Chronic diastolic CHF (congestive heart failure) (HCC)    Complication of anesthesia    during cervical surgery she woke up during the procedure   COPD (chronic obstructive pulmonary disease) ([place])    Depression    Diverticulosis    Family history of adverse reaction to anesthesia    mother had post op N&V   GERD (gastroesophageal reflux disease)    History of hiatal hernia    Hyperlipidemia    Hypertension    Insomnia    Macular degeneration    Migraines    Neuropathy    Obesity    PAF (paroxysmal atrial fibrillation) ([place])    Pneumonia    Sleep apnea    history of it,not using a cpap, study 2018: didn't need cpap   Thyroid nodule 2000   radioactive caspule   Past Surgical History:  Procedure Laterality Date   ANTERIOR CERVICAL DECOMP/DISCECTOMY FUSION N/A 06/15/2020   Procedure: ANTERIOR CERVICAL DECOMPRESSION FUSION CERVICAL FIVE-CERVICAL SIX  WITH INSTRUMENTATION AND ALLOGRAFT;  Surgeon: Phylliss Bob, MD;  Location: [facility];  Service: Orthopedics;  Laterality: N/A;  anterior   CERVICAL POLYPECTOMY     INSIDE AND OUTSIDE   COLONOSCOPY     EYE SURGERY Bilateral    cataracts   POLYPECTOMY     REMOVED CARTLIAGE RIB      Current Outpatient Medications  Medication Sig Dispense Refill   acetaminophen (TYLENOL) 650 MG CR tablet Take 1,300 mg by mouth every 8 (eight) hours as needed for pain.     albuterol (PROVENTIL HFA;VENTOLIN HFA) 108 (90 Base) MCG/ACT inhaler Inhale 2 puffs into the lungs every 6 (six) hours as needed for wheezing or shortness of breath. 1 Inhaler 6   alclomethasone (ACLOVATE) 0.05 % cream Apply topically 2 (two) times daily as needed (Rash). 180 g 3   aspirin EC 81 MG tablet Take 81 mg by mouth at bedtime. Swallow whole.     BESIVANCE 0.6 % SUSP Place 1 drop into the left eye See admin instructions. Instill 1 drop qid day of eye injection and qid the day after.     cetirizine (ZYRTEC) 10 MG tablet Take 1 tablet (10 mg total) by mouth daily. 30 tablet 4   Cyanocobalamin (VITAMIN B-12 PO) Take 2,500 mcg by mouth daily.     diltiazem (CARDIZEM CD) 240 MG 24 hr capsule Take 1 capsule (240 mg total) by mouth daily. 30 capsule  0   DULoxetine (CYMBALTA) 60 MG capsule Take 60 mg by mouth every morning.     ELIQUIS 5 MG TABS tablet Take 5 mg by mouth 2 (two) times daily.     flecainide (TAMBOCOR) 50 MG tablet Take 1 tablet (50 mg total) by mouth every 12 (twelve) hours. 60 tablet 0   fluticasone (FLONASE) 50 MCG/ACT nasal spray Place 1 spray into both nostrils daily. 16 g 5   furosemide (LASIX) 40 MG tablet Take 1 tablet (40 mg total) by mouth daily. 30 tablet 0   ipratropium-albuterol (DUONEB) 0.5-2.5 (3) MG/3ML SOLN INHALE contents of one vial using nebulizer TWICE DAILY 180 mL 0   LORazepam (ATIVAN) 0.5 MG tablet Take 0.5-1 mg by mouth daily as needed for anxiety. Anxiety     metoprolol succinate (TOPROL-XL) 50 MG 24 hr  tablet TAKE ONE TABLET BY MOUTH EVERYDAY AT BEDTIME 90 tablet 2   montelukast (SINGULAIR) 10 MG tablet Take 10 mg by mouth at bedtime.     Multiple Vitamins-Minerals (PRESERVISION AREDS 2+MULTI VIT) CAPS Take 1 tablet by mouth 2 (two) times daily.     mupirocin ointment (BACTROBAN) 2 % Apply 1 application topically 2 (two) times daily. 22 g 9   omeprazole (PRILOSEC) 20 MG capsule Take 20 mg by mouth at bedtime.     ondansetron (ZOFRAN) 4 MG tablet Take 1 tablet (4 mg total) by mouth every 8 (eight) hours as needed for nausea or vomiting. 20 tablet 0   pravastatin (PRAVACHOL) 40 MG tablet Take 40 mg by mouth at bedtime.      TRELEGY ELLIPTA 100-62.5-25 MCG/INH AEPB INHALE 1 PUFF BY MOUTH EVERY DAY 60 each 3   No current facility-administered medications for this visit.    Allergies  Allergen Reactions   Food Shortness Of Breath    ALLERGY= MUSHROOMS   Levaquin [Levofloxacin In D5w] Other (See Comments)    "made me want to die."   Tequin Shortness Of Breath   Erythromycin Other (See Comments)    GI UPSET   Oxycodone-Acetaminophen Itching   Codeine Nausea And Vomiting   Penicillins Rash    Reaction: Childhood   Prednisone Rash   Wellbutrin [Bupropion Hcl] Other (See Comments)    SHAKING    Social History   Socioeconomic History   Marital status: Divorced    Spouse name: Not on file   Number of children: 3   Years of education: College   Highest education level: Not on file  Occupational History   Occupation: Emergency planning/management officer  Tobacco Use   Smoking status: Former    Packs/day: 1.00    Years: 20.00    Pack years: 20.00    Types: Cigarettes    Quit date: 05/21/1991    Years since quitting: 29.5   Smokeless tobacco: Never  Vaping Use   Vaping Use: Never used  Substance and Sexual Activity   Alcohol use: No   Drug use: No   Sexual activity: Not on file  Other Topics Concern   Not on file  Social History Narrative   Patient is divorced with 3 children.   Patient is  right handed.   Patient has a college education.   Patient drinks 1-2 servings daily.   Social Determinants of Health   Financial Resource Strain: Not on file  Food Insecurity: Not on file  Transportation Needs: Not on file  Physical Activity: Not on file  Stress: Not on file  Social Connections: Not on file  Intimate Partner  Violence: Not on file    Review of Systems: All other systems reviewed and are otherwise negative except as noted above.  Physical Exam: Vitals:   11/23/20 0920  BP: (!) 102/50  Pulse: 72  SpO2: 95%  Weight: 216 lb (98 kg)  Height: 5' 2.5" (1.588 m)    GEN- The patient is well appearing, alert and oriented x 3 today.   HEENT: normocephalic, atraumatic; sclera clear, conjunctiva pink; hearing intact; oropharynx clear; neck supple, no JVP Lymph- no cervical lymphadenopathy Lungs- Clear to ausculation bilaterally, normal work of breathing.  No wheezes, rales, rhonchi Heart- Regular rate and rhythm, no murmurs, rubs or gallops, PMI not laterally displaced GI- soft, non-tender, non-distended, bowel sounds present, no hepatosplenomegaly Extremities- no clubbing, cyanosis, or edema; DP/PT/radial pulses 2+ bilaterally MS- no significant deformity or atrophy Skin- warm and dry, no rash or lesion Psych- euthymic mood, full affect Neuro- strength and sensation are intact  EKG is ordered. Personal review of EKG from today shows NSR at 72 bpm, QRS 104 ms, PR interval 116 ms  Additional studies reviewed include: Recent admission notes  Assessment and Plan:  1. Paroxysmal atrial fibrillation EKG today shows NSR with stable intervals Continue eliquis for CHA2DS2-VASc of at least 5. Continue diltiazem 240 mg daily Continue toprol Continue flecainide 50 mg BID. Can uptitrate eventually if needed. Not candidate for amio given COPD and neuropathy  2. Chronic diastolic CHF Echo recent admission shows LVEF 65-70%, severe mitral annular calcification Volume  status OK on exam NYHA II-III, confounded by inactivity.  3. Hypokalemia Noted on admission, normalized by discharge BMET today.   4. HTN Soft today. Continue toprol 50 mg daily for now.   Shirley Friar, PA-C  11/23/20 9:35 AM

## 2020-11-27 NOTE — Telephone Encounter (Signed)
Called patient about her results.LMTCB.

## 2020-11-27 NOTE — Addendum Note (Signed)
Addended by: Freada Bergeron on: 11/27/2020 12:10 PM   Modules accepted: Orders

## 2020-11-27 NOTE — Progress Notes (Signed)
Hi [name] Molina - per Dr. Theodosia Blender recommendation, study was normal so she recommends an in-lab sleep study please. I see she routed it to you, just wanted to make sure you saw this. Thank you!

## 2020-11-28 ENCOUNTER — Other Ambulatory Visit: Payer: Self-pay

## 2020-11-28 ENCOUNTER — Ambulatory Visit (HOSPITAL_COMMUNITY)
Admission: RE | Admit: 2020-11-28 | Discharge: 2020-11-28 | Disposition: A | Discharge status: Home / Self Care | Payer: Medicare Other | Source: Ambulatory Visit | Attending: Cardiovascular Disease | Admitting: Cardiovascular Disease

## 2020-11-28 DIAGNOSIS — L819 Disorder of pigmentation, unspecified: Secondary | ICD-10-CM | POA: Diagnosis not present

## 2020-11-28 DIAGNOSIS — I480 Paroxysmal atrial fibrillation: Secondary | ICD-10-CM | POA: Diagnosis not present

## 2020-11-28 DIAGNOSIS — I5032 Chronic diastolic (congestive) heart failure: Secondary | ICD-10-CM | POA: Diagnosis not present

## 2020-11-28 DIAGNOSIS — I10 Essential (primary) hypertension: Secondary | ICD-10-CM | POA: Diagnosis not present

## 2020-11-28 DIAGNOSIS — D649 Anemia, unspecified: Secondary | ICD-10-CM

## 2020-11-28 DIAGNOSIS — I471 Supraventricular tachycardia: Secondary | ICD-10-CM

## 2020-11-28 DIAGNOSIS — I700 Atherosclerosis of aorta: Secondary | ICD-10-CM | POA: Diagnosis not present

## 2020-11-30 NOTE — Telephone Encounter (Signed)
The patient has been notified of the result and verbalized understanding.  All questions (if any) were answered. Marolyn Hammock, [place] 11/30/2020 2:55 PM   Patient has declined further testing.

## 2020-12-05 NOTE — Telephone Encounter (Signed)
Addressed in other mychart msg

## 2020-12-05 NOTE — Telephone Encounter (Signed)
Patient clarified in additional MyChart msg she was referring to the test done 7/12 (LE ABIs/duplex ultrasound). Have closed out that note as it is a duplicate.

## 2020-12-09 ENCOUNTER — Encounter: Payer: Self-pay | Admitting: Gastroenterology

## 2020-12-11 DIAGNOSIS — I10 Essential (primary) hypertension: Secondary | ICD-10-CM | POA: Diagnosis not present

## 2020-12-11 DIAGNOSIS — I480 Paroxysmal atrial fibrillation: Secondary | ICD-10-CM | POA: Diagnosis not present

## 2020-12-11 DIAGNOSIS — J449 Chronic obstructive pulmonary disease, unspecified: Secondary | ICD-10-CM | POA: Diagnosis not present

## 2020-12-11 DIAGNOSIS — F331 Major depressive disorder, recurrent, moderate: Secondary | ICD-10-CM | POA: Diagnosis not present

## 2020-12-11 DIAGNOSIS — M79605 Pain in left leg: Secondary | ICD-10-CM | POA: Diagnosis not present

## 2020-12-11 DIAGNOSIS — M79604 Pain in right leg: Secondary | ICD-10-CM | POA: Diagnosis not present

## 2020-12-11 DIAGNOSIS — D6869 Other thrombophilia: Secondary | ICD-10-CM | POA: Diagnosis not present

## 2020-12-13 DIAGNOSIS — E785 Hyperlipidemia, unspecified: Secondary | ICD-10-CM | POA: Diagnosis not present

## 2020-12-13 DIAGNOSIS — J441 Chronic obstructive pulmonary disease with (acute) exacerbation: Secondary | ICD-10-CM | POA: Diagnosis not present

## 2020-12-13 DIAGNOSIS — I480 Paroxysmal atrial fibrillation: Secondary | ICD-10-CM | POA: Diagnosis not present

## 2020-12-13 DIAGNOSIS — J4520 Mild intermittent asthma, uncomplicated: Secondary | ICD-10-CM | POA: Diagnosis not present

## 2020-12-13 DIAGNOSIS — F329 Major depressive disorder, single episode, unspecified: Secondary | ICD-10-CM | POA: Diagnosis not present

## 2020-12-13 DIAGNOSIS — I5033 Acute on chronic diastolic (congestive) heart failure: Secondary | ICD-10-CM | POA: Diagnosis not present

## 2020-12-13 DIAGNOSIS — J449 Chronic obstructive pulmonary disease, unspecified: Secondary | ICD-10-CM | POA: Diagnosis not present

## 2020-12-13 DIAGNOSIS — I10 Essential (primary) hypertension: Secondary | ICD-10-CM | POA: Diagnosis not present

## 2020-12-13 DIAGNOSIS — K219 Gastro-esophageal reflux disease without esophagitis: Secondary | ICD-10-CM | POA: Diagnosis not present

## 2020-12-13 DIAGNOSIS — G43009 Migraine without aura, not intractable, without status migrainosus: Secondary | ICD-10-CM | POA: Diagnosis not present

## 2020-12-14 DIAGNOSIS — Z23 Encounter for immunization: Secondary | ICD-10-CM | POA: Diagnosis not present

## 2020-12-18 ENCOUNTER — Telehealth: Payer: Self-pay | Admitting: Cardiology

## 2020-12-18 MED ORDER — DILTIAZEM HCL ER COATED BEADS 240 MG PO CP24: 240.0000 mg | ORAL_CAPSULE | Freq: Every day | ORAL | 6 refills | Status: DC

## 2020-12-18 MED ORDER — FLECAINIDE ACETATE 50 MG PO TABS: 50.0000 mg | ORAL_TABLET | Freq: Two times a day (BID) | ORAL | 5 refills | Status: DC

## 2020-12-18 NOTE — Telephone Encounter (Signed)
Per Dr. Gwinda [place] CAD.  Stop ASA.  Left detailed message advising Pt to stop ASA.  Attempted to call Loma Sousa to advise to stop ASA.  Call went to VM.  Requested [place] call office back.

## 2020-12-18 NOTE — Telephone Encounter (Signed)
*  STAT* If patient is at the pharmacy, call can be transferred to refill team.   1. Which medications need to be refilled? (please list name of each medication and dose if known)  flecainide (TAMBOCOR) 50 MG tablet (Expired) diltiazem (CARDIZEM CD) 240 MG 24 hr capsule 2. Which pharmacy/location (including street and city if local pharmacy) is medication to be sent to? [facility] - [place], [place] - [place] [address]  3. Do they need a 30 day or 90 day supply?  30 day supply

## 2020-12-18 NOTE — Telephone Encounter (Signed)
Pt c/o medication issue:  1. Name of Medication:  aspirin EC 81 MG tablet  2. How are you currently taking this medication (dosage and times per day)? Not sure if she is taking or not  3. Are you having a reaction (difficulty breathing--STAT)? N/A   4. What is your medication issue? Wants to know if she still needs to be on medication.

## 2020-12-18 NOTE — Telephone Encounter (Signed)
Refill for Cardizem & Flecainide has been sent to [facility], per pt's request.

## 2021-01-10 DIAGNOSIS — J4520 Mild intermittent asthma, uncomplicated: Secondary | ICD-10-CM | POA: Diagnosis not present

## 2021-01-10 DIAGNOSIS — F331 Major depressive disorder, recurrent, moderate: Secondary | ICD-10-CM | POA: Diagnosis not present

## 2021-01-10 DIAGNOSIS — G43009 Migraine without aura, not intractable, without status migrainosus: Secondary | ICD-10-CM | POA: Diagnosis not present

## 2021-01-10 DIAGNOSIS — J449 Chronic obstructive pulmonary disease, unspecified: Secondary | ICD-10-CM | POA: Diagnosis not present

## 2021-01-10 DIAGNOSIS — K219 Gastro-esophageal reflux disease without esophagitis: Secondary | ICD-10-CM | POA: Diagnosis not present

## 2021-01-10 DIAGNOSIS — J441 Chronic obstructive pulmonary disease with (acute) exacerbation: Secondary | ICD-10-CM | POA: Diagnosis not present

## 2021-01-10 DIAGNOSIS — I5033 Acute on chronic diastolic (congestive) heart failure: Secondary | ICD-10-CM | POA: Diagnosis not present

## 2021-01-10 DIAGNOSIS — E785 Hyperlipidemia, unspecified: Secondary | ICD-10-CM | POA: Diagnosis not present

## 2021-01-10 DIAGNOSIS — I480 Paroxysmal atrial fibrillation: Secondary | ICD-10-CM | POA: Diagnosis not present

## 2021-01-10 DIAGNOSIS — I10 Essential (primary) hypertension: Secondary | ICD-10-CM | POA: Diagnosis not present

## 2021-02-04 ENCOUNTER — Other Ambulatory Visit: Payer: Self-pay | Admitting: Emergency Medicine

## 2021-02-08 DIAGNOSIS — U071 COVID-19: Secondary | ICD-10-CM | POA: Diagnosis not present

## 2021-02-08 DIAGNOSIS — I5033 Acute on chronic diastolic (congestive) heart failure: Secondary | ICD-10-CM | POA: Diagnosis not present

## 2021-02-08 DIAGNOSIS — K219 Gastro-esophageal reflux disease without esophagitis: Secondary | ICD-10-CM | POA: Diagnosis not present

## 2021-02-08 DIAGNOSIS — I10 Essential (primary) hypertension: Secondary | ICD-10-CM | POA: Diagnosis not present

## 2021-02-08 DIAGNOSIS — J449 Chronic obstructive pulmonary disease, unspecified: Secondary | ICD-10-CM | POA: Diagnosis not present

## 2021-02-08 DIAGNOSIS — J4520 Mild intermittent asthma, uncomplicated: Secondary | ICD-10-CM | POA: Diagnosis not present

## 2021-02-08 DIAGNOSIS — G43009 Migraine without aura, not intractable, without status migrainosus: Secondary | ICD-10-CM | POA: Diagnosis not present

## 2021-02-08 DIAGNOSIS — J441 Chronic obstructive pulmonary disease with (acute) exacerbation: Secondary | ICD-10-CM | POA: Diagnosis not present

## 2021-02-08 DIAGNOSIS — F329 Major depressive disorder, single episode, unspecified: Secondary | ICD-10-CM | POA: Diagnosis not present

## 2021-02-08 DIAGNOSIS — E785 Hyperlipidemia, unspecified: Secondary | ICD-10-CM | POA: Diagnosis not present

## 2021-02-08 DIAGNOSIS — I480 Paroxysmal atrial fibrillation: Secondary | ICD-10-CM | POA: Diagnosis not present

## 2021-02-08 DIAGNOSIS — D509 Iron deficiency anemia, unspecified: Secondary | ICD-10-CM | POA: Diagnosis not present

## 2021-02-19 DIAGNOSIS — J449 Chronic obstructive pulmonary disease, unspecified: Secondary | ICD-10-CM | POA: Diagnosis not present

## 2021-02-19 DIAGNOSIS — I480 Paroxysmal atrial fibrillation: Secondary | ICD-10-CM | POA: Diagnosis not present

## 2021-02-19 DIAGNOSIS — J0110 Acute frontal sinusitis, unspecified: Secondary | ICD-10-CM | POA: Diagnosis not present

## 2021-02-19 DIAGNOSIS — J40 Bronchitis, not specified as acute or chronic: Secondary | ICD-10-CM | POA: Diagnosis not present

## 2021-03-01 ENCOUNTER — Other Ambulatory Visit: Payer: Self-pay

## 2021-03-01 ENCOUNTER — Ambulatory Visit (INDEPENDENT_AMBULATORY_CARE_PROVIDER_SITE_OTHER): Payer: Medicare Other | Admitting: Cardiology

## 2021-03-01 ENCOUNTER — Encounter: Payer: Self-pay | Admitting: Cardiology

## 2021-03-01 VITALS — BP 110/60 | HR 73 | Ht 62.5 in | Wt 214.8 lb

## 2021-03-01 DIAGNOSIS — I480 Paroxysmal atrial fibrillation: Secondary | ICD-10-CM

## 2021-03-01 NOTE — Progress Notes (Signed)
Electrophysiology Office Note   Date:  03/01/2021   ID:  Krupa, Stege June 17, 1945, MRN [id number]  PCP:  Harlan Stains, MD  Cardiologist:  Marlou Porch Primary Electrophysiologist:  Franky Reier Meredith Leeds, MD    Chief Complaint: AF   History of Present Illness: [name] Molina is a 75 y.o. female who is being seen today for the evaluation of AF at the request of Harlan Stains, MD. Presenting today for electrophysiology evaluation.  She has a history significant for COPD, atrial fibrillation, diastolic heart failure, and anxiety.  She presented to the hospital June 2022 with rapid atrial fibrillation.  She had been on Eliquis.  She was started on flecainide during her hospitalization.  Today, she denies symptoms of palpitations, chest pain, shortness of breath, orthopnea, PND, lower extremity edema, claudication, dizziness, presyncope, syncope, bleeding, or neurologic sequela. The patient is tolerating medications without difficulties.  Since then, she has not had any further episodes of atrial fibrillation.  She did have COVID in September and has been feeling weak and tired since then.  She has been having some upper respiratory congestion as well.  She is unaware of further episodes of atrial fibrillation.  She checks her pulse at home that has been quite regular.  She does have daytime fatigue.  She takes her diltiazem and metoprolol during the day.   Past Medical History:  Diagnosis Date   Anxiety    Aortic atherosclerosis (HCC)    Arthritis    Asthma    Atrial tachycardia ([place])    Cancer (HCC)    basal skin cancer on nose   Cataract    Chronic diastolic CHF (congestive heart failure) (HCC)    Complication of anesthesia    during cervical surgery she woke up during the procedure   COPD (chronic obstructive pulmonary disease) ([place])    Depression    Diverticulosis    Family history of adverse reaction to anesthesia    mother had post op N&V   GERD (gastroesophageal reflux  disease)    History of hiatal hernia    Hyperlipidemia    Hypertension    Insomnia    Macular degeneration    Migraines    Neuropathy    Obesity    PAF (paroxysmal atrial fibrillation) ([place])    Pneumonia    Sleep apnea    history of it,not using a cpap, study 2018: didn't need cpap   Thyroid nodule 2000   radioactive caspule   Past Surgical History:  Procedure Laterality Date   ANTERIOR CERVICAL DECOMP/DISCECTOMY FUSION N/A 06/15/2020   Procedure: ANTERIOR CERVICAL DECOMPRESSION FUSION CERVICAL FIVE-CERVICAL SIX WITH INSTRUMENTATION AND ALLOGRAFT;  Surgeon: Phylliss Bob, MD;  Location: [facility];  Service: Orthopedics;  Laterality: N/A;  anterior   CERVICAL POLYPECTOMY     INSIDE AND OUTSIDE   COLONOSCOPY     EYE SURGERY Bilateral    cataracts   POLYPECTOMY     REMOVED CARTLIAGE RIB       Current Outpatient Medications  Medication Sig Dispense Refill   acetaminophen (TYLENOL) 650 MG CR tablet Take 1,300 mg by mouth every 8 (eight) hours as needed for pain.     albuterol (PROVENTIL HFA;VENTOLIN HFA) 108 (90 Base) MCG/ACT inhaler Inhale 2 puffs into the lungs every 6 (six) hours as needed for wheezing or shortness of breath. 1 Inhaler 6   alclomethasone (ACLOVATE) 0.05 % cream Apply topically 2 (two) times daily as needed (Rash). 180 g 3   BESIVANCE 0.6 % SUSP  Place 1 drop into the left eye See admin instructions. Instill 1 drop qid day of eye injection and qid the day after.     cefUROXime (CEFTIN) 500 MG tablet Take 500 mg by mouth 2 (two) times daily with a meal. For 10 days. S     cetirizine (ZYRTEC) 10 MG tablet TAKE ONE TABLET BY MOUTH ONCE DAILY 30 tablet 4   Cyanocobalamin (VITAMIN B-12 PO) Take 2,500 mcg by mouth daily.     diltiazem (CARDIZEM CD) 240 MG 24 hr capsule Take 1 capsule (240 mg total) by mouth daily. 30 capsule 6   DULoxetine (CYMBALTA) 60 MG capsule Take 60 mg by mouth every morning.     ELIQUIS 5 MG TABS tablet Take 5 mg by mouth 2 (two) times daily.      flecainide (TAMBOCOR) 50 MG tablet Take 1 tablet (50 mg total) by mouth every 12 (twelve) hours. 60 tablet 5   fluticasone (FLONASE) 50 MCG/ACT nasal spray Place 1 spray into both nostrils daily. 16 g 5   furosemide (LASIX) 40 MG tablet Take 1 tablet (40 mg total) by mouth daily. 30 tablet 0   ipratropium-albuterol (DUONEB) 0.5-2.5 (3) MG/3ML SOLN INHALE contents of one vial using nebulizer TWICE DAILY 180 mL 0   LORazepam (ATIVAN) 0.5 MG tablet Take 0.5-1 mg by mouth daily as needed for anxiety. Anxiety     metoprolol succinate (TOPROL-XL) 50 MG 24 hr tablet TAKE ONE TABLET BY MOUTH EVERYDAY AT BEDTIME 90 tablet 2   montelukast (SINGULAIR) 10 MG tablet Take 10 mg by mouth at bedtime.     Multiple Vitamins-Minerals (PRESERVISION AREDS 2+MULTI VIT) CAPS Take 1 tablet by mouth 2 (two) times daily.     mupirocin ointment (BACTROBAN) 2 % Apply 1 application topically 2 (two) times daily. 22 g 9   omeprazole (PRILOSEC) 20 MG capsule Take 20 mg by mouth at bedtime.     ondansetron (ZOFRAN) 4 MG tablet Take 1 tablet (4 mg total) by mouth every 8 (eight) hours as needed for nausea or vomiting. 20 tablet 0   pravastatin (PRAVACHOL) 40 MG tablet Take 40 mg by mouth at bedtime.      promethazine-dextromethorphan (PROMETHAZINE-DM) 6.25-15 MG/5ML syrup Take by mouth 4 (four) times daily as needed for cough.     TRELEGY ELLIPTA 100-62.5-25 MCG/INH AEPB INHALE 1 PUFF BY MOUTH EVERY DAY 60 each 3   No current facility-administered medications for this visit.    Allergies:   Food, Levaquin [levofloxacin in d5w], Tequin, Erythromycin, Oxycodone-acetaminophen, Codeine, Penicillins, Prednisone, and Wellbutrin [bupropion hcl]   Social History:  The patient  reports that she quit smoking about 29 years ago. Her smoking use included cigarettes. She has a 20.00 pack-year smoking history. She has never used smokeless tobacco. She reports that she does not drink alcohol and does not use drugs.   Family History:  The  patient's family history includes Asthma in her father; Breast cancer (age of onset: 27) in her maternal grandmother; Colon polyps (age of onset: 69) in her sister; Emphysema in her father; Heart disease in her father and paternal grandfather.    ROS:  Please see the history of present illness.   Otherwise, review of systems is positive for none.   All other systems are reviewed and negative.    PHYSICAL EXAM: VS:  BP 110/60   Pulse 73   Ht 5' 2.5" (1.588 m)   Wt 214 lb 12.8 oz (97.4 kg)   SpO2 93%  BMI 38.66 kg/m  , BMI Body mass index is 38.66 kg/m. GEN: Well nourished, well developed, in no acute distress  HEENT: normal  Neck: no JVD, carotid bruits, or masses Cardiac: RRR; no murmurs, rubs, or gallops,no edema  Respiratory:  clear to auscultation bilaterally, normal work of breathing GI: soft, nontender, nondistended, + BS MS: no deformity or atrophy  Skin: warm and dry Neuro:  Strength and sensation are intact Psych: euthymic mood, full affect  EKG:  EKG is ordered today. Personal review of the ekg ordered shows sinus rhythm, rate 73  Recent Labs: 08/13/2020: B Natriuretic Peptide 135.2 11/01/2020: TSH 1.150 11/16/2020: ALT 14 11/17/2020: Hemoglobin 11.6; Magnesium 1.9; Platelets 251 11/23/2020: BUN 15; Creatinine, Ser 0.80; Potassium 3.6; Sodium 144    Lipid Panel  No results found for: CHOL, TRIG, HDL, CHOLHDL, VLDL, LDLCALC, LDLDIRECT   Wt Readings from Last 3 Encounters:  03/01/21 214 lb 12.8 oz (97.4 kg)  11/23/20 216 lb (98 kg)  11/17/20 213 lb 6.4 oz (96.8 kg)      Other studies Reviewed: Additional studies/ records that were reviewed today include: TTE 11/16/20  Review of the above records today demonstrates:   1. Afib with RVR noted during the study.   2. Left ventricular ejection fraction, by estimation, is 65 to 70%. The  left ventricle has normal function. The left ventricle has no regional  wall motion abnormalities. There is moderate concentric left  ventricular  hypertrophy. Left ventricular  diastolic function could not be evaluated.   3. Prominent RV epicardial fat. Right ventricular systolic function is  normal. The right ventricular size is normal. Tricuspid regurgitation  signal is inadequate for assessing PA pressure.   4. Left atrial size was mild to moderately dilated.   5. The mitral valve is degenerative. Trivial mitral valve regurgitation.  Mild mitral stenosis. The mean mitral valve gradient is 5.3 mmHg with  average heart rate of 106 bpm. Severe mitral annular calcification.   6. The aortic valve is grossly normal. Aortic valve regurgitation is not  visualized. No aortic stenosis is present.   7. The inferior vena cava is dilated in size with >50% respiratory  variability, suggesting right atrial pressure of 8 mmHg.    ASSESSMENT AND PLAN:  1.  Paroxysmal atrial fibrillation: CHA2DS2-VASc of at least 5.  Currently on Eliquis 5 mg twice daily, flecainide 50 mg twice daily, diltiazem 240 mg daily.  High risk medication monitoring via ECG.  She unfortunately has been having some fatigue.  She remains in sinus rhythm.  Due to fatigue, she Angel Weedon take her diltiazem and metoprolol in the evening.  2.  Chronic diastolic heart failure: Has severe mitral annular calcification with an ejection fraction of 65 to 70%.  Continue with current management.  Current medicines are reviewed at length with the patient today.   The patient does not have concerns regarding her medicines.  The following changes were made today:  none  Labs/ tests ordered today include:  Orders Placed This Encounter  Procedures   EKG 12-Lead     Disposition:   FU with [facility] 6 months  Signed, Abiha Lukehart Meredith Leeds, MD  03/01/2021 9:52 AM     [place]  [phone] (office) [phone] (fax)

## 2021-03-01 NOTE — Patient Instructions (Signed)
Medication Instructions:  Your physician recommends that you continue on your current medications as directed. Please refer to the Current Medication list given to you today.  *If you need a refill on your cardiac medications before your next appointment, please call your pharmacy*   Lab Work: None ordered   Testing/Procedures: None ordered   Follow-Up: At [facility], you and your health needs are our priority.  As part of our continuing mission to provide you with exceptional heart care, we have created designated Provider Care Teams.  These Care Teams include your primary Cardiologist (physician) and Advanced Practice Providers (APPs -  Physician Assistants and Nurse Practitioners) who all work together to provide you with the care you need, when you need it.  We recommend signing up for the patient portal called "MyChart".  Sign up information is provided on this After Visit Summary.  MyChart is used to connect with patients for Virtual Visits (Telemedicine).  Patients are able to view lab/test results, encounter notes, upcoming appointments, etc.  Non-urgent messages can be sent to your provider as well.   To learn more about what you can do with MyChart, go to [email or web address].    Your next appointment:   6 month(s)  The format for your next appointment:   In Person  Provider:   Legrand Como "Jonni Sanger" Chalmers Cater, PA-C    Thank you for choosing [facility]!!   Trinidad Curet, RN [phone]

## 2021-03-05 ENCOUNTER — Other Ambulatory Visit: Payer: Self-pay

## 2021-03-05 ENCOUNTER — Encounter: Payer: Self-pay | Admitting: Adult Health

## 2021-03-05 ENCOUNTER — Ambulatory Visit (INDEPENDENT_AMBULATORY_CARE_PROVIDER_SITE_OTHER): Payer: Medicare Other | Admitting: Adult Health

## 2021-03-05 ENCOUNTER — Ambulatory Visit (INDEPENDENT_AMBULATORY_CARE_PROVIDER_SITE_OTHER): Payer: Medicare Other

## 2021-03-05 VITALS — BP 126/76 | HR 76 | Temp 97.8°F | Ht 62.0 in | Wt 212.8 lb

## 2021-03-05 DIAGNOSIS — J439 Emphysema, unspecified: Secondary | ICD-10-CM | POA: Diagnosis not present

## 2021-03-05 DIAGNOSIS — I5032 Chronic diastolic (congestive) heart failure: Secondary | ICD-10-CM | POA: Diagnosis not present

## 2021-03-05 DIAGNOSIS — R0602 Shortness of breath: Secondary | ICD-10-CM

## 2021-03-05 DIAGNOSIS — J9611 Chronic respiratory failure with hypoxia: Secondary | ICD-10-CM

## 2021-03-05 DIAGNOSIS — J449 Chronic obstructive pulmonary disease, unspecified: Secondary | ICD-10-CM

## 2021-03-05 DIAGNOSIS — R059 Cough, unspecified: Secondary | ICD-10-CM | POA: Diagnosis not present

## 2021-03-05 DIAGNOSIS — R9389 Abnormal findings on diagnostic imaging of other specified body structures: Secondary | ICD-10-CM | POA: Diagnosis not present

## 2021-03-05 MED ORDER — PREDNISONE 20 MG PO TABS
20.0000 mg | ORAL_TABLET | Freq: Every day | ORAL | 0 refills | Status: DC
Start: 2021-03-05 — End: 2021-03-15

## 2021-03-05 MED ORDER — ALBUTEROL SULFATE HFA 108 (90 BASE) MCG/ACT IN AERS: 2.0000 | INHALATION_SPRAY | Freq: Four times a day (QID) | RESPIRATORY_TRACT | 6 refills | Status: DC | PRN

## 2021-03-05 NOTE — Progress Notes (Signed)
@Patient  ID: [name] Molina, female    DOB: 05-Oct-1945, 75 y.o.   MRN: [id number]  Chief Complaint  Patient presents with   Follow-up    Referring provider: Harlan Stains, MD  HPI: 75 year old female former smoker followed for COPD with asthma, allergic rhinitis.,  Chronic respiratory failure on oxygen with activity.  Medical history significant for congestive heart failure and A. fib  TEST/EVENTS :   severe obstruction on pulmonary function testing 12/2018 with an FEV1 of 1.22 L (57% predicted).    CT chest November 16, 2020 negative for PE, nonspecific mediastinal adenopathy chronic lower lobe lung scarring  03/05/2021 Follow up ; COPD with asthma, allergic rhinitis and COVID-19 Patient presents for a follow-up visit.  Patient has underlying severe COPD with asthma and allergic rhinitis.  She is also on oxygen 2 L with activity. Patient complains at 3 weeks ago she tested positive for COVID-19.  She was given a 5-day Molnupiravir pack.  Patient says she has had cold symptoms with productive cough thick mucus.  She was seen by her primary care provider when her cough continued and did not resolve.  She initially took a 10-day course of doxycycline (completed 8 days).  Her cough continued without significant provement.  She was seen by her primary care provider and given a 10-day course of Ceftin.  Which she has a few days left.  Patient says that she does feel a little better but her cough continues and is very thick at times.  She also has been having some shortness of breath and wheezing.  She has been having some ankle swelling which has been slightly worse since she has not been propping her feet up as much.  She denies any hemoptysis chest pain.  Appetite is good with no nausea vomiting or diarrhea.      Allergies  Allergen Reactions   Food Shortness Of Breath    ALLERGY= MUSHROOMS   Levaquin [Levofloxacin In D5w] Other (See Comments)    "made me want to die."   Tequin Shortness  Of Breath   Erythromycin Other (See Comments)    GI UPSET   Oxycodone-Acetaminophen Itching   Codeine Nausea And Vomiting   Penicillins Rash    Reaction: Childhood   Prednisone Rash   Wellbutrin [Bupropion Hcl] Other (See Comments)    SHAKING    Immunization History  Administered Date(s) Administered   Fluad Quad(high Dose 65+) 02/08/2020   Influenza Split 02/27/2010, 02/18/2011, 01/20/2012, 02/01/2013, 02/17/2014, 03/04/2018   Influenza Whole 02/17/2017   Influenza, High Dose Seasonal PF 03/05/2018   Influenza,inj,Quad PF,6+ Mos 02/20/2015, 02/18/2016   Janssen (J&J) SARS-COV-2 Vaccination 08/28/2019   Moderna SARS-COV2 Booster Vaccination 12/27/2020   Pneumococcal Conjugate-13 08/18/2013, 09/20/2013   Pneumococcal Polysaccharide-23 05/20/2006, 02/27/2010, 03/01/2015   Tdap 08/29/2011, 05/08/2017   Zoster Recombinat (Shingrix) 12/24/2017   Zoster, Live 05/21/2007, 09/02/2007, 12/24/2017, 03/27/2018    Past Medical History:  Diagnosis Date   Anxiety    Aortic atherosclerosis (HCC)    Arthritis    Asthma    Atrial tachycardia ([place])    Cancer (HCC)    basal skin cancer on nose   Cataract    Chronic diastolic CHF (congestive heart failure) (HCC)    Complication of anesthesia    during cervical surgery she woke up during the procedure   COPD (chronic obstructive pulmonary disease) (HCC)    Depression    Diverticulosis    Family history of adverse reaction to anesthesia    mother had  post op N&V   GERD (gastroesophageal reflux disease)    History of hiatal hernia    Hyperlipidemia    Hypertension    Insomnia    Macular degeneration    Migraines    Neuropathy    Obesity    PAF (paroxysmal atrial fibrillation) (HCC)    Pneumonia    Sleep apnea    history of it,not using a cpap, study 2018: didn't need cpap   Thyroid nodule 2000   radioactive caspule    Tobacco History: Social History   Tobacco Use  Smoking Status Former   Packs/day: 1.00   Years: 20.00    Pack years: 20.00   Types: Cigarettes   Quit date: 05/21/1991   Years since quitting: 29.8  Smokeless Tobacco Never   Counseling given: Not Answered   Outpatient Medications Prior to Visit  Medication Sig Dispense Refill   acetaminophen (TYLENOL) 650 MG CR tablet Take 1,300 mg by mouth every 8 (eight) hours as needed for pain.     alclomethasone (ACLOVATE) 0.05 % cream Apply topically 2 (two) times daily as needed (Rash). 180 g 3   BESIVANCE 0.6 % SUSP Place 1 drop into the left eye See admin instructions. Instill 1 drop qid day of eye injection and qid the day after.     cefUROXime (CEFTIN) 500 MG tablet Take 500 mg by mouth 2 (two) times daily with a meal. For 10 days. S     cetirizine (ZYRTEC) 10 MG tablet TAKE ONE TABLET BY MOUTH ONCE DAILY 30 tablet 4   Cyanocobalamin (VITAMIN B-12 PO) Take 2,500 mcg by mouth daily.     DULoxetine (CYMBALTA) 60 MG capsule Take 60 mg by mouth every morning.     ELIQUIS 5 MG TABS tablet Take 5 mg by mouth 2 (two) times daily.     fluticasone (FLONASE) 50 MCG/ACT nasal spray Place 1 spray into both nostrils daily. 16 g 5   LORazepam (ATIVAN) 0.5 MG tablet Take 0.5-1 mg by mouth daily as needed for anxiety. Anxiety     metoprolol succinate (TOPROL-XL) 50 MG 24 hr tablet TAKE ONE TABLET BY MOUTH EVERYDAY AT BEDTIME 90 tablet 2   montelukast (SINGULAIR) 10 MG tablet Take 10 mg by mouth at bedtime.     Multiple Vitamins-Minerals (PRESERVISION AREDS 2+MULTI VIT) CAPS Take 1 tablet by mouth 2 (two) times daily.     mupirocin ointment (BACTROBAN) 2 % Apply 1 application topically 2 (two) times daily. 22 g 9   omeprazole (PRILOSEC) 20 MG capsule Take 20 mg by mouth at bedtime.     ondansetron (ZOFRAN) 4 MG tablet Take 1 tablet (4 mg total) by mouth every 8 (eight) hours as needed for nausea or vomiting. 20 tablet 0   pravastatin (PRAVACHOL) 40 MG tablet Take 40 mg by mouth at bedtime.      promethazine-dextromethorphan (PROMETHAZINE-DM) 6.25-15 MG/5ML syrup Take  by mouth 4 (four) times daily as needed for cough.     TRELEGY ELLIPTA 100-62.5-25 MCG/INH AEPB INHALE 1 PUFF BY MOUTH EVERY DAY 60 each 3   albuterol (PROVENTIL HFA;VENTOLIN HFA) 108 (90 Base) MCG/ACT inhaler Inhale 2 puffs into the lungs every 6 (six) hours as needed for wheezing or shortness of breath. 1 Inhaler 6   diltiazem (CARDIZEM CD) 240 MG 24 hr capsule Take 1 capsule (240 mg total) by mouth daily. 30 capsule 6   flecainide (TAMBOCOR) 50 MG tablet Take 1 tablet (50 mg total) by mouth every 12 (twelve) hours. [place]  tablet 5   furosemide (LASIX) 40 MG tablet Take 1 tablet (40 mg total) by mouth daily. 30 tablet 0   ipratropium-albuterol (DUONEB) 0.5-2.5 (3) MG/3ML SOLN INHALE contents of one vial using nebulizer TWICE DAILY (Patient not taking: Reported on 03/05/2021) 180 mL 0   No facility-administered medications prior to visit.     Review of Systems:   Constitutional:   No  weight loss, night sweats,  Fevers, chills, + fatigue, or  lassitude.  HEENT:   No headaches,  Difficulty swallowing,  Tooth/dental problems, or  Sore throat,                No sneezing, itching, ear ache,  +nasal congestion, post nasal drip,   CV:  No chest pain,  Orthopnea, PND, swelling in lower extremities, anasarca, dizziness, palpitations, syncope.   GI  No heartburn, indigestion, abdominal pain, nausea, vomiting, diarrhea, change in bowel habits, loss of appetite, bloody stools.   Resp:   No chest wall deformity  Skin: no rash or lesions.  GU: no dysuria, change in color of urine, no urgency or frequency.  No flank pain, no hematuria   MS:  No joint pain or swelling.  No decreased range of motion.  No back pain.    Physical Exam  BP 126/76 (BP Location: Right Arm, Patient Position: Sitting, Cuff Size: Large)   Pulse 76   Temp 97.8 F (36.6 C) (Oral)   Ht 5' 2"  (1.575 m)   Wt 212 lb 12.8 oz (96.5 kg)   SpO2 94%   BMI 38.92 kg/m   GEN: A/Ox3; pleasant , NAD,    HEENT:  [place]/AT,    NOSE-clear, THROAT-clear, no lesions, no postnasal drip or exudate noted.   NECK:  Supple w/ fair ROM; no JVD; normal carotid impulses w/o bruits; no thyromegaly or nodules palpated; no lymphadenopathy.    RESP scattered rhonchi bilaterally  no accessory muscle use, no dullness to percussion  CARD:  RRR, no m/r/g, 1+ peripheral edema, pulses intact, no cyanosis or clubbing.  GI:   Soft & nt; nml bowel sounds; no organomegaly or masses detected.   Musco: Warm bil, no deformities or joint swelling noted.   Neuro: alert, no focal deficits noted.    Skin: Warm, no lesions or rashes    Lab Results:    CBC    Component Value Date/Time   WBC 5.1 11/17/2020 0314   RBC 4.20 11/17/2020 0314   HGB 11.6 (L) 11/17/2020 0314   HGB 14.2 11/01/2020 1506   HCT 36.7 11/17/2020 0314   HCT 42.3 11/01/2020 1506   PLT 251 11/17/2020 0314   PLT 208 11/01/2020 1506   MCV 87.4 11/17/2020 0314   MCV 83 11/01/2020 1506   MCH 27.6 11/17/2020 0314   MCHC 31.6 11/17/2020 0314   RDW 14.9 11/17/2020 0314   RDW 13.6 11/01/2020 1506   LYMPHSABS 1.2 11/16/2020 0356   MONOABS 0.7 11/16/2020 0356   EOSABS 0.2 11/16/2020 0356   BASOSABS 0.1 11/16/2020 0356    BMET    Component Value Date/Time   NA 144 11/23/2020 0951   K 3.6 11/23/2020 0951   CL 105 11/23/2020 0951   CO2 22 11/23/2020 0951   GLUCOSE 149 (H) 11/23/2020 0951   GLUCOSE 130 (H) 11/17/2020 0314   BUN 15 11/23/2020 0951   CREATININE 0.80 11/23/2020 0951   CALCIUM 9.6 11/23/2020 0951   GFRNONAA >60 11/17/2020 0314   GFRAA 69 03/08/2020 1517    BNP  Component Value Date/Time   BNP 135.2 (H) 08/13/2020 0150    ProBNP    Component Value Date/Time   PROBNP 82.0 07/22/2018 1542    Imaging: No results found.    PFT Results Latest Ref Rng & Units 01/15/2019  FVC-Pre L 2.17  FVC-Predicted Pre % 76  FVC-Post L 2.21  FVC-Predicted Post % 78  Pre FEV1/FVC % % 56  Post FEV1/FCV % % 59  FEV1-Pre L 1.22  FEV1-Predicted Pre  % 57  FEV1-Post L 1.30  DLCO uncorrected ml/min/mmHg 16.18  DLCO UNC% % 85  DLVA Predicted % 95  TLC L 4.84  TLC % Predicted % 96  RV % Predicted % 118    No results found for: NITRICOXIDE      Assessment & Plan:   COPD (chronic obstructive pulmonary disease) (HCC) Slow to resolve exacerbation with recent COVID-19 infection Check chest x-ray Finish up antibiotics.  We will add low-dose prednisone as patient cannot take elevated doses.  Plan  Patient Instructions  Finish Ceftin as directed.  Prednisone 69m daily for 5 days  Robitussin 1-2 tsp every 4hrs as needed  Delsym 2 tsp Twice daily  As needed  cough  Chest xray and labs today .  Continue on Oxygen 2l/m with activity .  Extra Lasix 425mx 1 dose tomorrow .  Continue on TRELEGY 1 puff daily  Albuerol inhaler or Neb As needed   Follow up with Dr. ByLamonte Sakaiin 2 weeks and As needed   Please contact office for sooner follow up if symptoms do not improve or worsen or seek emergency care       Chronic respiratory failure with hypoxia (HCFountainContinue on Oxygen 2l/m with activity , goal is o2 sats >88-90%   Chronic diastolic CHF (congestive heart failure) (HCC) Mild lower extremity swelling.  Does not appear to be in overt heart failure.  We will give a general diuresis with extra Lasix x1 day.  Check labs with be met CBC and BNP  Plan  Patient Instructions  Finish Ceftin as directed.  Prednisone 2029maily for 5 days  Robitussin 1-2 tsp every 4hrs as needed  Delsym 2 tsp Twice daily  As needed  cough  Chest xray and labs today .  Continue on Oxygen 2l/m with activity .  Extra Lasix 50m45m1 dose tomorrow .  Continue on TRELEGY 1 puff daily  Albuerol inhaler or Neb As needed   Follow up with Dr. ByruLamonte Sakai 2 weeks and As needed   Please contact office for sooner follow up if symptoms do not improve or worsen or seek emergency care       Abnormal CT scan, chest CT chest November 16, 2020 showed some nonspecific  mediastinal adenopathy questionable etiology.  This was during acute illness. Patient is also sick with recent illness at today's visit.  Will need a follow-up CT scan going forward. On return visit we will decide on timing.   I spent   40 minutes dedicated to the care of this patient on the date of this encounter to include pre-visit review of records, face-to-face time with the patient discussing conditions above, post visit ordering of testing, clinical documentation with the electronic health record, making appropriate referrals as documented, and communicating necessary findings to members of the patients care team.    TammRexene Edison 03/05/2021

## 2021-03-05 NOTE — Assessment & Plan Note (Signed)
CT chest November 16, 2020 showed some nonspecific mediastinal adenopathy questionable etiology.  This was during acute illness. Patient is also sick with recent illness at today's visit.  Will need a follow-up CT scan going forward. On return visit we will decide on timing.

## 2021-03-05 NOTE — Assessment & Plan Note (Signed)
Continue on Oxygen 2l/m with activity , goal is o2 sats >88-90%

## 2021-03-05 NOTE — Assessment & Plan Note (Addendum)
Slow to resolve exacerbation with recent COVID-19 infection Check chest x-ray Finish up antibiotics.(will have completed 18 days of abx )   We will add low-dose prednisone as patient cannot take elevated doses.  Plan  Patient Instructions  Finish Ceftin as directed.  Prednisone 20mg  daily for 5 days  Robitussin 1-2 tsp every 4hrs as needed  Delsym 2 tsp Twice daily  As needed  cough  Chest xray and labs today .  Continue on Oxygen 2l/m with activity .  Extra Lasix 40mg  x 1 dose tomorrow .  Continue on TRELEGY 1 puff daily  Albuerol inhaler or Neb As needed   Follow up with Dr. Lamonte Sakai  in 2 weeks and As needed   Please contact office for sooner follow up if symptoms do not improve or worsen or seek emergency care

## 2021-03-05 NOTE — Assessment & Plan Note (Signed)
Mild lower extremity swelling.  Does not appear to be in overt heart failure.  We will give a general diuresis with extra Lasix x1 day.  Check labs with be met CBC and BNP  Plan  Patient Instructions  Finish Ceftin as directed.  Prednisone 69m daily for 5 days  Robitussin 1-2 tsp every 4hrs as needed  Delsym 2 tsp Twice daily  As needed  cough  Chest xray and labs today .  Continue on Oxygen 2l/m with activity .  Extra Lasix 416mx 1 dose tomorrow .  Continue on TRELEGY 1 puff daily  Albuerol inhaler or Neb As needed   Follow up with Dr. ByLamonte Sakaiin 2 weeks and As needed   Please contact office for sooner follow up if symptoms do not improve or worsen or seek emergency care

## 2021-03-05 NOTE — Patient Instructions (Addendum)
Finish Ceftin as directed.  Prednisone 20mg  daily for 5 days  Robitussin 1-2 tsp every 4hrs as needed  Delsym 2 tsp Twice daily  As needed  cough  Chest xray and labs today .  Continue on Oxygen 2l/m with activity .  Extra Lasix 40mg  x 1 dose tomorrow .  Continue on TRELEGY 1 puff daily  Albuerol inhaler or Neb As needed   Follow up with Dr. Lamonte Sakai  in 2 weeks and As needed   Please contact office for sooner follow up if symptoms do not improve or worsen or seek emergency care

## 2021-03-06 LAB — CBC WITH DIFFERENTIAL/PLATELET
Basophils Absolute: 0.1 10*3/uL (ref 0.0–0.1)
Basophils Relative: 1.2 % (ref 0.0–3.0)
Eosinophils Absolute: 0.1 10*3/uL (ref 0.0–0.7)
Eosinophils Relative: 2.3 % (ref 0.0–5.0)
HCT: 43.9 % (ref 36.0–46.0)
Hemoglobin: 14.2 g/dL (ref 12.0–15.0)
Lymphocytes Relative: 15.8 % (ref 12.0–46.0)
Lymphs Abs: 0.9 10*3/uL (ref 0.7–4.0)
MCHC: 32.4 g/dL (ref 30.0–36.0)
MCV: 82.2 fl (ref 78.0–100.0)
Monocytes Absolute: 0.7 10*3/uL (ref 0.1–1.0)
Monocytes Relative: 12.7 % — ABNORMAL HIGH (ref 3.0–12.0)
Neutro Abs: 3.9 10*3/uL (ref 1.4–7.7)
Neutrophils Relative %: 68 % (ref 43.0–77.0)
Platelets: 191 10*3/uL (ref 150.0–400.0)
RBC: 5.34 Mil/uL — ABNORMAL HIGH (ref 3.87–5.11)
RDW: 16.3 % — ABNORMAL HIGH (ref 11.5–15.5)
WBC: 5.7 10*3/uL (ref 4.0–10.5)

## 2021-03-06 LAB — BASIC METABOLIC PANEL
BUN: 13 mg/dL (ref 6–23)
CO2: 31 mEq/L (ref 19–32)
Calcium: 9.3 mg/dL (ref 8.4–10.5)
Chloride: 102 mEq/L (ref 96–112)
Creatinine, Ser: 0.73 mg/dL (ref 0.40–1.20)
GFR: 79.68 mL/min (ref >60.00)
Glucose, Bld: 90 mg/dL (ref 70–99)
Potassium: 3.8 mEq/L (ref 3.5–5.1)
Sodium: 143 mEq/L (ref 135–145)

## 2021-03-06 NOTE — Progress Notes (Signed)
Called and spoke with patient, she states that she is not feeling well.  She has taken the Lasix that was sent in and if she is to take anymore, another script will have to be sent in for her.  She also stated that she was incontinent after taking it.  She has 3 more days of Ceftin to take and 4 more days of Prednisone to take.  Advised to let us know if she is not feeling better after she completes the medication.  She verified understanding.  Nothing further needed.

## 2021-03-07 ENCOUNTER — Emergency Department (HOSPITAL_COMMUNITY): Payer: Medicare Other

## 2021-03-07 ENCOUNTER — Inpatient Hospital Stay (HOSPITAL_COMMUNITY)
Admission: EM | Admit: 2021-03-07 | Discharge: 2021-03-15 | DRG: 871 | Disposition: A | Discharge status: Skilled Nursing Facility | Payer: Medicare Other | Attending: Internal Medicine | Admitting: Internal Medicine

## 2021-03-07 ENCOUNTER — Other Ambulatory Visit: Payer: Self-pay

## 2021-03-07 DIAGNOSIS — J449 Chronic obstructive pulmonary disease, unspecified: Secondary | ICD-10-CM | POA: Diagnosis not present

## 2021-03-07 DIAGNOSIS — Z87891 Personal history of nicotine dependence: Secondary | ICD-10-CM

## 2021-03-07 DIAGNOSIS — R059 Cough, unspecified: Secondary | ICD-10-CM | POA: Diagnosis not present

## 2021-03-07 DIAGNOSIS — I480 Paroxysmal atrial fibrillation: Secondary | ICD-10-CM | POA: Diagnosis present

## 2021-03-07 DIAGNOSIS — J9621 Acute and chronic respiratory failure with hypoxia: Secondary | ICD-10-CM | POA: Diagnosis present

## 2021-03-07 DIAGNOSIS — Z7951 Long term (current) use of inhaled steroids: Secondary | ICD-10-CM | POA: Diagnosis not present

## 2021-03-07 DIAGNOSIS — I6529 Occlusion and stenosis of unspecified carotid artery: Secondary | ICD-10-CM | POA: Diagnosis not present

## 2021-03-07 DIAGNOSIS — J440 Chronic obstructive pulmonary disease with acute lower respiratory infection: Secondary | ICD-10-CM | POA: Diagnosis present

## 2021-03-07 DIAGNOSIS — K760 Fatty (change of) liver, not elsewhere classified: Secondary | ICD-10-CM | POA: Diagnosis not present

## 2021-03-07 DIAGNOSIS — Z7901 Long term (current) use of anticoagulants: Secondary | ICD-10-CM

## 2021-03-07 DIAGNOSIS — G609 Hereditary and idiopathic neuropathy, unspecified: Secondary | ICD-10-CM | POA: Diagnosis not present

## 2021-03-07 DIAGNOSIS — F0393 Unspecified dementia, unspecified severity, with mood disturbance: Secondary | ICD-10-CM | POA: Diagnosis present

## 2021-03-07 DIAGNOSIS — Z7401 Bed confinement status: Secondary | ICD-10-CM | POA: Diagnosis not present

## 2021-03-07 DIAGNOSIS — G4700 Insomnia, unspecified: Secondary | ICD-10-CM | POA: Diagnosis not present

## 2021-03-07 DIAGNOSIS — F419 Anxiety disorder, unspecified: Secondary | ICD-10-CM | POA: Diagnosis not present

## 2021-03-07 DIAGNOSIS — J4520 Mild intermittent asthma, uncomplicated: Secondary | ICD-10-CM | POA: Diagnosis not present

## 2021-03-07 DIAGNOSIS — R404 Transient alteration of awareness: Secondary | ICD-10-CM | POA: Diagnosis not present

## 2021-03-07 DIAGNOSIS — G43009 Migraine without aura, not intractable, without status migrainosus: Secondary | ICD-10-CM | POA: Diagnosis not present

## 2021-03-07 DIAGNOSIS — E782 Mixed hyperlipidemia: Secondary | ICD-10-CM | POA: Diagnosis not present

## 2021-03-07 DIAGNOSIS — I2510 Atherosclerotic heart disease of native coronary artery without angina pectoris: Secondary | ICD-10-CM | POA: Diagnosis present

## 2021-03-07 DIAGNOSIS — G9341 Metabolic encephalopathy: Secondary | ICD-10-CM | POA: Diagnosis not present

## 2021-03-07 DIAGNOSIS — K5900 Constipation, unspecified: Secondary | ICD-10-CM | POA: Diagnosis present

## 2021-03-07 DIAGNOSIS — I700 Atherosclerosis of aorta: Secondary | ICD-10-CM | POA: Diagnosis not present

## 2021-03-07 DIAGNOSIS — T1490XA Injury, unspecified, initial encounter: Secondary | ICD-10-CM

## 2021-03-07 DIAGNOSIS — Z79899 Other long term (current) drug therapy: Secondary | ICD-10-CM | POA: Diagnosis not present

## 2021-03-07 DIAGNOSIS — R0902 Hypoxemia: Secondary | ICD-10-CM | POA: Diagnosis not present

## 2021-03-07 DIAGNOSIS — Z803 Family history of malignant neoplasm of breast: Secondary | ICD-10-CM

## 2021-03-07 DIAGNOSIS — J189 Pneumonia, unspecified organism: Secondary | ICD-10-CM | POA: Diagnosis present

## 2021-03-07 DIAGNOSIS — E785 Hyperlipidemia, unspecified: Secondary | ICD-10-CM | POA: Diagnosis present

## 2021-03-07 DIAGNOSIS — I959 Hypotension, unspecified: Secondary | ICD-10-CM | POA: Diagnosis present

## 2021-03-07 DIAGNOSIS — K219 Gastro-esophageal reflux disease without esophagitis: Secondary | ICD-10-CM | POA: Diagnosis present

## 2021-03-07 DIAGNOSIS — R0781 Pleurodynia: Secondary | ICD-10-CM

## 2021-03-07 DIAGNOSIS — E876 Hypokalemia: Secondary | ICD-10-CM | POA: Diagnosis present

## 2021-03-07 DIAGNOSIS — F329 Major depressive disorder, single episode, unspecified: Secondary | ICD-10-CM | POA: Diagnosis not present

## 2021-03-07 DIAGNOSIS — Z8616 Personal history of COVID-19: Secondary | ICD-10-CM

## 2021-03-07 DIAGNOSIS — A419 Sepsis, unspecified organism: Principal | ICD-10-CM | POA: Diagnosis present

## 2021-03-07 DIAGNOSIS — I10 Essential (primary) hypertension: Secondary | ICD-10-CM | POA: Diagnosis not present

## 2021-03-07 DIAGNOSIS — G4489 Other headache syndrome: Secondary | ICD-10-CM | POA: Diagnosis not present

## 2021-03-07 DIAGNOSIS — Z1159 Encounter for screening for other viral diseases: Secondary | ICD-10-CM | POA: Diagnosis not present

## 2021-03-07 DIAGNOSIS — S199XXA Unspecified injury of neck, initial encounter: Secondary | ICD-10-CM | POA: Diagnosis not present

## 2021-03-07 DIAGNOSIS — R000 Tachycardia, unspecified: Secondary | ICD-10-CM | POA: Diagnosis not present

## 2021-03-07 DIAGNOSIS — I110 Hypertensive heart disease with heart failure: Secondary | ICD-10-CM | POA: Diagnosis present

## 2021-03-07 DIAGNOSIS — Z7952 Long term (current) use of systemic steroids: Secondary | ICD-10-CM

## 2021-03-07 DIAGNOSIS — J441 Chronic obstructive pulmonary disease with (acute) exacerbation: Secondary | ICD-10-CM | POA: Diagnosis not present

## 2021-03-07 DIAGNOSIS — G928 Other toxic encephalopathy: Secondary | ICD-10-CM

## 2021-03-07 DIAGNOSIS — I5032 Chronic diastolic (congestive) heart failure: Secondary | ICD-10-CM | POA: Diagnosis present

## 2021-03-07 DIAGNOSIS — I5033 Acute on chronic diastolic (congestive) heart failure: Secondary | ICD-10-CM | POA: Diagnosis not present

## 2021-03-07 DIAGNOSIS — Z8249 Family history of ischemic heart disease and other diseases of the circulatory system: Secondary | ICD-10-CM | POA: Diagnosis not present

## 2021-03-07 DIAGNOSIS — E669 Obesity, unspecified: Secondary | ICD-10-CM | POA: Diagnosis not present

## 2021-03-07 DIAGNOSIS — Z85828 Personal history of other malignant neoplasm of skin: Secondary | ICD-10-CM

## 2021-03-07 DIAGNOSIS — M6281 Muscle weakness (generalized): Secondary | ICD-10-CM | POA: Diagnosis not present

## 2021-03-07 DIAGNOSIS — F331 Major depressive disorder, recurrent, moderate: Secondary | ICD-10-CM | POA: Diagnosis not present

## 2021-03-07 DIAGNOSIS — E46 Unspecified protein-calorie malnutrition: Secondary | ICD-10-CM | POA: Diagnosis not present

## 2021-03-07 DIAGNOSIS — M48061 Spinal stenosis, lumbar region without neurogenic claudication: Secondary | ICD-10-CM | POA: Diagnosis not present

## 2021-03-07 DIAGNOSIS — R2689 Other abnormalities of gait and mobility: Secondary | ICD-10-CM | POA: Diagnosis not present

## 2021-03-07 DIAGNOSIS — H269 Unspecified cataract: Secondary | ICD-10-CM | POA: Diagnosis not present

## 2021-03-07 DIAGNOSIS — J9811 Atelectasis: Secondary | ICD-10-CM | POA: Diagnosis not present

## 2021-03-07 DIAGNOSIS — Z66 Do not resuscitate: Secondary | ICD-10-CM | POA: Diagnosis present

## 2021-03-07 DIAGNOSIS — J45909 Unspecified asthma, uncomplicated: Secondary | ICD-10-CM | POA: Diagnosis not present

## 2021-03-07 DIAGNOSIS — R410 Disorientation, unspecified: Secondary | ICD-10-CM | POA: Diagnosis not present

## 2021-03-07 DIAGNOSIS — J90 Pleural effusion, not elsewhere classified: Secondary | ICD-10-CM | POA: Diagnosis not present

## 2021-03-07 DIAGNOSIS — S0990XA Unspecified injury of head, initial encounter: Secondary | ICD-10-CM | POA: Diagnosis not present

## 2021-03-07 DIAGNOSIS — Z043 Encounter for examination and observation following other accident: Secondary | ICD-10-CM | POA: Diagnosis not present

## 2021-03-07 DIAGNOSIS — Z9889 Other specified postprocedural states: Secondary | ICD-10-CM | POA: Diagnosis not present

## 2021-03-07 DIAGNOSIS — Z981 Arthrodesis status: Secondary | ICD-10-CM | POA: Diagnosis not present

## 2021-03-07 DIAGNOSIS — R4182 Altered mental status, unspecified: Secondary | ICD-10-CM | POA: Diagnosis not present

## 2021-03-07 DIAGNOSIS — G8929 Other chronic pain: Secondary | ICD-10-CM | POA: Diagnosis not present

## 2021-03-07 DIAGNOSIS — H3530 Unspecified macular degeneration: Secondary | ICD-10-CM | POA: Diagnosis not present

## 2021-03-07 LAB — CBC WITH DIFFERENTIAL/PLATELET
Abs Immature Granulocytes: 0.11 10*3/uL — ABNORMAL HIGH (ref 0.00–0.07)
Basophils Absolute: 0.1 10*3/uL (ref 0.0–0.1)
Basophils Relative: 0 %
Eosinophils Absolute: 0 10*3/uL (ref 0.0–0.5)
Eosinophils Relative: 0 %
HCT: 39.2 % (ref 36.0–46.0)
Hemoglobin: 12.6 g/dL (ref 12.0–15.0)
Immature Granulocytes: 1 %
Lymphocytes Relative: 5 %
Lymphs Abs: 0.7 10*3/uL (ref 0.7–4.0)
MCH: 27.2 pg (ref 26.0–34.0)
MCHC: 32.1 g/dL (ref 30.0–36.0)
MCV: 84.5 fL (ref 80.0–100.0)
Monocytes Absolute: 1.6 10*3/uL — ABNORMAL HIGH (ref 0.1–1.0)
Monocytes Relative: 10 %
Neutro Abs: 12.7 10*3/uL — ABNORMAL HIGH (ref 1.7–7.7)
Neutrophils Relative %: 84 %
Platelets: 158 10*3/uL (ref 150–400)
RBC: 4.64 MIL/uL (ref 3.87–5.11)
RDW: 16.5 % — ABNORMAL HIGH (ref 11.5–15.5)
WBC: 15.1 10*3/uL — ABNORMAL HIGH (ref 4.0–10.5)
nRBC: 0 % (ref 0.0–0.2)

## 2021-03-07 LAB — URINALYSIS, ROUTINE W REFLEX MICROSCOPIC
Bacteria, UA: NONE SEEN
Bilirubin Urine: NEGATIVE
Glucose, UA: NEGATIVE mg/dL
Hgb urine dipstick: NEGATIVE
Ketones, ur: NEGATIVE mg/dL
Nitrite: NEGATIVE
Protein, ur: NEGATIVE mg/dL
Specific Gravity, Urine: 1.03 (ref 1.005–1.030)
pH: 6 (ref 5.0–8.0)

## 2021-03-07 LAB — COMPREHENSIVE METABOLIC PANEL
ALT: 15 U/L (ref 0–44)
AST: 22 U/L (ref 15–41)
Albumin: 3.2 g/dL — ABNORMAL LOW (ref 3.5–5.0)
Alkaline Phosphatase: 90 U/L (ref 38–126)
Anion gap: 9 (ref 5–15)
BUN: 13 mg/dL (ref 8–23)
CO2: 27 mmol/L (ref 22–32)
Calcium: 8.3 mg/dL — ABNORMAL LOW (ref 8.9–10.3)
Chloride: 101 mmol/L (ref 98–111)
Creatinine, Ser: 0.75 mg/dL (ref 0.44–1.00)
GFR, Estimated: >60 mL/min (ref >60)
Glucose, Bld: 134 mg/dL — ABNORMAL HIGH (ref 70–99)
Potassium: 3.7 mmol/L (ref 3.5–5.1)
Sodium: 137 mmol/L (ref 135–145)
Total Bilirubin: 1.6 mg/dL — ABNORMAL HIGH (ref 0.3–1.2)
Total Protein: 6.1 g/dL — ABNORMAL LOW (ref 6.5–8.1)

## 2021-03-07 LAB — RESP PANEL BY RT-PCR (FLU A&B, COVID) ARPGX2
Influenza A by PCR: NEGATIVE
Influenza B by PCR: NEGATIVE
SARS Coronavirus 2 by RT PCR: NEGATIVE

## 2021-03-07 LAB — LACTIC ACID, PLASMA
Lactic Acid, Venous: 2.1 mmol/L (ref 0.5–1.9)
Lactic Acid, Venous: 1.2 mmol/L (ref 0.5–1.9)
Lactic Acid, Venous: 1.3 mmol/L (ref 0.5–1.9)

## 2021-03-07 LAB — PROCALCITONIN: Procalcitonin: 0.16 ng/mL

## 2021-03-07 LAB — APTT: aPTT: 36 seconds (ref 24–36)

## 2021-03-07 LAB — PROTIME-INR
INR: 1.7 — ABNORMAL HIGH (ref 0.8–1.2)
Prothrombin Time: 19.8 seconds — ABNORMAL HIGH (ref 11.4–15.2)

## 2021-03-07 LAB — CBG MONITORING, ED: Glucose-Capillary: 124 mg/dL — ABNORMAL HIGH (ref 70–99)

## 2021-03-07 LAB — STREP PNEUMONIAE URINARY ANTIGEN: Strep Pneumo Urinary Antigen: NEGATIVE

## 2021-03-07 MED ORDER — APIXABAN 5 MG PO TABS
5.0000 mg | ORAL_TABLET | Freq: Two times a day (BID) | ORAL | Status: DC
  Administered 2021-03-07 – 2021-03-15 (×17): 5 mg via ORAL
  Filled 2021-03-07 (×17): qty 1

## 2021-03-07 MED ORDER — ACETAMINOPHEN 325 MG PO TABS
650.0000 mg | ORAL_TABLET | Freq: Four times a day (QID) | ORAL | Status: DC | PRN
  Administered 2021-03-07 – 2021-03-14 (×13): 650 mg via ORAL
  Filled 2021-03-07 (×13): qty 2

## 2021-03-07 MED ORDER — ACETAMINOPHEN 650 MG RE SUPP: 650.0000 mg | Freq: Four times a day (QID) | RECTAL | Status: DC | PRN

## 2021-03-07 MED ORDER — VANCOMYCIN HCL IN DEXTROSE 1-5 GM/200ML-% IV SOLN
1000.0000 mg | INTRAVENOUS | Status: DC
  Administered 2021-03-08 – 2021-03-10 (×3): 1000 mg via INTRAVENOUS
  Filled 2021-03-07 (×3): qty 200

## 2021-03-07 MED ORDER — ALCLOMETASONE DIPROPIONATE 0.05 % EX CREA: 1.0000 "application " | TOPICAL_CREAM | Freq: Two times a day (BID) | CUTANEOUS | Status: DC | PRN

## 2021-03-07 MED ORDER — DULOXETINE HCL 60 MG PO CPEP
60.0000 mg | ORAL_CAPSULE | Freq: Every day | ORAL | Status: DC
  Administered 2021-03-07 – 2021-03-15 (×9): 60 mg via ORAL
  Filled 2021-03-07 (×9): qty 1

## 2021-03-07 MED ORDER — LEVALBUTEROL HCL 0.63 MG/3ML IN NEBU
0.6300 mg | INHALATION_SOLUTION | Freq: Four times a day (QID) | RESPIRATORY_TRACT | Status: DC | PRN
  Administered 2021-03-11 – 2021-03-12 (×2): 0.63 mg via RESPIRATORY_TRACT
  Filled 2021-03-07 (×2): qty 3

## 2021-03-07 MED ORDER — IOHEXOL 300 MG/ML  SOLN
80.0000 mL | Freq: Once | INTRAMUSCULAR | Status: AC | PRN
  Administered 2021-03-07: 80 mL via INTRAVENOUS

## 2021-03-07 MED ORDER — LORATADINE 10 MG PO TABS
10.0000 mg | ORAL_TABLET | Freq: Every day | ORAL | Status: DC
  Administered 2021-03-07 – 2021-03-15 (×9): 10 mg via ORAL
  Filled 2021-03-07 (×9): qty 1

## 2021-03-07 MED ORDER — SODIUM CHLORIDE 0.9 % IV SOLN
2.0000 g | Freq: Once | INTRAVENOUS | Status: AC
  Administered 2021-03-07: 2 g via INTRAVENOUS
  Filled 2021-03-07: qty 2

## 2021-03-07 MED ORDER — SODIUM CHLORIDE 0.9 % IV SOLN
2.0000 g | Freq: Three times a day (TID) | INTRAVENOUS | Status: AC
  Administered 2021-03-07 – 2021-03-11 (×13): 2 g via INTRAVENOUS
  Filled 2021-03-07 (×13): qty 2

## 2021-03-07 MED ORDER — UMECLIDINIUM BROMIDE 62.5 MCG/ACT IN AEPB
1.0000 | INHALATION_SPRAY | Freq: Every day | RESPIRATORY_TRACT | Status: DC
  Administered 2021-03-08 – 2021-03-15 (×6): 1 via RESPIRATORY_TRACT
  Filled 2021-03-07: qty 7

## 2021-03-07 MED ORDER — LACTATED RINGERS IV BOLUS: 500.0000 mL | Freq: Once | INTRAVENOUS | Status: DC

## 2021-03-07 MED ORDER — SODIUM CHLORIDE 0.45 % IV BOLUS
1000.0000 mL | Freq: Once | INTRAVENOUS | Status: AC
  Administered 2021-03-07: 1000 mL via INTRAVENOUS

## 2021-03-07 MED ORDER — VANCOMYCIN HCL 2000 MG/400ML IV SOLN
2000.0000 mg | Freq: Once | INTRAVENOUS | Status: AC
  Administered 2021-03-07: 2000 mg via INTRAVENOUS
  Filled 2021-03-07 (×2): qty 400

## 2021-03-07 MED ORDER — GUAIFENESIN ER 600 MG PO TB12
600.0000 mg | ORAL_TABLET | Freq: Two times a day (BID) | ORAL | Status: DC
  Administered 2021-03-07 – 2021-03-08 (×3): 600 mg via ORAL
  Filled 2021-03-07 (×3): qty 1

## 2021-03-07 MED ORDER — LACTATED RINGERS IV SOLN: INTRAVENOUS | Status: AC

## 2021-03-07 MED ORDER — FLUTICASONE-UMECLIDIN-VILANT 100-62.5-25 MCG/ACT IN AEPB: 1.0000 | INHALATION_SPRAY | Freq: Every morning | RESPIRATORY_TRACT | Status: DC

## 2021-03-07 MED ORDER — ONDANSETRON HCL 4 MG/2ML IJ SOLN: 4.0000 mg | Freq: Four times a day (QID) | INTRAMUSCULAR | Status: DC | PRN

## 2021-03-07 MED ORDER — SODIUM CHLORIDE 0.9% FLUSH
3.0000 mL | Freq: Two times a day (BID) | INTRAVENOUS | Status: DC
  Administered 2021-03-07 – 2021-03-15 (×14): 3 mL via INTRAVENOUS

## 2021-03-07 MED ORDER — MONTELUKAST SODIUM 10 MG PO TABS
10.0000 mg | ORAL_TABLET | Freq: Every day | ORAL | Status: DC
  Administered 2021-03-07 – 2021-03-14 (×8): 10 mg via ORAL
  Filled 2021-03-07 (×8): qty 1

## 2021-03-07 MED ORDER — LORAZEPAM 0.5 MG PO TABS
0.5000 mg | ORAL_TABLET | Freq: Every day | ORAL | Status: DC | PRN
  Administered 2021-03-08: 1 mg via ORAL
  Filled 2021-03-07: qty 2

## 2021-03-07 MED ORDER — ASPIRIN EC 81 MG PO TBEC
81.0000 mg | DELAYED_RELEASE_TABLET | Freq: Every day | ORAL | Status: DC
  Administered 2021-03-07 – 2021-03-14 (×8): 81 mg via ORAL
  Filled 2021-03-07 (×8): qty 1

## 2021-03-07 MED ORDER — FLECAINIDE ACETATE 50 MG PO TABS
50.0000 mg | ORAL_TABLET | Freq: Two times a day (BID) | ORAL | Status: DC
  Administered 2021-03-07 – 2021-03-15 (×17): 50 mg via ORAL
  Filled 2021-03-07 (×19): qty 1

## 2021-03-07 MED ORDER — LACTATED RINGERS IV BOLUS
500.0000 mL | Freq: Once | INTRAVENOUS | Status: AC
  Administered 2021-03-07: 500 mL via INTRAVENOUS

## 2021-03-07 MED ORDER — PRAVASTATIN SODIUM 40 MG PO TABS
40.0000 mg | ORAL_TABLET | Freq: Every day | ORAL | Status: DC
  Administered 2021-03-07 – 2021-03-14 (×8): 40 mg via ORAL
  Filled 2021-03-07 (×8): qty 1

## 2021-03-07 MED ORDER — ONDANSETRON HCL 4 MG PO TABS: 4.0000 mg | ORAL_TABLET | Freq: Four times a day (QID) | ORAL | Status: DC | PRN

## 2021-03-07 MED ORDER — ACETAMINOPHEN 500 MG PO TABS
1000.0000 mg | ORAL_TABLET | Freq: Once | ORAL | Status: AC
  Administered 2021-03-07: 1000 mg via ORAL
  Filled 2021-03-07: qty 2

## 2021-03-07 MED ORDER — FLUTICASONE PROPIONATE 50 MCG/ACT NA SUSP
1.0000 | Freq: Every day | NASAL | Status: DC | PRN
  Filled 2021-03-07: qty 16

## 2021-03-07 MED ORDER — FLUTICASONE FUROATE-VILANTEROL 100-25 MCG/ACT IN AEPB
1.0000 | INHALATION_SPRAY | Freq: Every day | RESPIRATORY_TRACT | Status: DC
  Administered 2021-03-08 – 2021-03-15 (×6): 1 via RESPIRATORY_TRACT
  Filled 2021-03-07: qty 28

## 2021-03-07 NOTE — Progress Notes (Signed)
Pharmacy Antibiotic Note  [name] Molina is a 75 y.o. female admitted on 03/07/2021 with pneumonia.  Pharmacy has been consulted for vancomycin and cefepime dosing.  Plan: Vancomycin 2000mg  x1 then 1000mg  IV q24h (eAUC 480, Cr rounded to 0.8mg /dL, Vd 0.5) Cefepime 2g IV q8h -Monitor renal function, clinical status, and antibiotic plan -Order MRSA PCR -Order vanc levels as necessary  Height: 5\' 2"  (157.5 cm) Weight: 95.3 kg (210 lb) IBW/kg (Calculated) : 50.1  Temp (24hrs), Avg:101.7 F (38.7 C), Min:101.7 F (38.7 C), Max:101.7 F (38.7 C)  Recent Labs  Lab 03/05/21 1628  WBC 5.7  CREATININE 0.73    Estimated Creatinine Clearance: 64.4 mL/min (by C-G formula based on SCr of 0.73 mg/dL).    Allergies  Allergen Reactions   Food Shortness Of Breath    ALLERGY= MUSHROOMS   Levaquin [Levofloxacin In D5w] Other (See Comments)    "made me want to die."   Tequin Shortness Of Breath   Erythromycin Other (See Comments)    GI UPSET   Oxycodone-Acetaminophen Itching   Codeine Nausea And Vomiting   Penicillins Rash    Reaction: Childhood   Prednisone Rash   Wellbutrin [Bupropion Hcl] Other (See Comments)    SHAKING    Antimicrobials this admission: Vanc 10/19 >>  Cefepime 10/19 >>   Microbiology results: 10/19 BCx:   Thank you for allowing pharmacy to be a part of this patient's care.  Joetta Manners, PharmD, [facility] Emergency Medicine Clinical Pharmacist ED RPh Phone: [place]: [phone]

## 2021-03-07 NOTE — Sepsis Progress Note (Signed)
Notified bedside nurse of need to administer fluid bolus.  

## 2021-03-07 NOTE — Sepsis Progress Note (Signed)
eLink is following this Code Sepsis. °

## 2021-03-07 NOTE — ED Notes (Signed)
Phlebotomy informed of need for blood cultures, they will be en route

## 2021-03-07 NOTE — ED Notes (Signed)
Pt to CT

## 2021-03-07 NOTE — H&P (Addendum)
History and Physical    [name] Molina CWC:[id number] DOB: April 15, 1946 DOA: 03/07/2021  Referring MD/NP/PA: Margarita Mail, PA-C PCP: Harlan Stains, MD  Patient coming from: Home via EMS  Chief Complaint: Confusion  I have personally briefly reviewed patient's old medical records in [place]   HPI: [name] Molina is a 75 y.o. female with medical history significant of COPD with asthma, chronic respiratory failure, diastolic congestive heart failure, paroxysmal atrial fibrillation on chronic anticoagulation, and recent COVID-19 infection who presents with increasing confusion.  Patient is more alert and oriented at this time and able to provide history. She had tested positive for COVID-19 about 1 month ago and ever since that has been dealing with a cough.  The cough seems productive, but she has not able to cough much of anything up.  She had been evaluated by her PCP for the symptoms and had taken 8 days of a 10-day course of doxycycline and then subsequently prescribed a 10-day course of Ceftin without any significant improvement in symptoms.  Notes associated symptoms of wheezing, subjective fever, leg swelling, poor p.o. intake, and generalized malaise.  Denies having any significant nausea, vomiting, diarrhea, or chest pain symptoms.  She followed up with pulmonology 2 days ago and was prescribed a course of prednisone.  Patient states that yesterday, she fell into her recliner and hurt the right side of her chest.  She denies any loss of consciousness or trauma to her head.  Last night patient admits that she was confused.  Daughter states that patient was very disoriented this morning for which EMS was called.  ED Course: Upon admission into the emergency department patient was seen to be febrile up to 101.7 F, respirations 17-29, heart rate 97-109, blood pressures 100/45 -131/61, and O2 saturations as low as 88% with improvement on 2 L of nasal cannula oxygen.  Labs significant for WBC  15.1, calcium 8.3, total bilirubin 1.6, and lactic acid 2.1.  Chest x-ray initially noted low lung volumes without active disease.  Blood cultures were obtained.  CT scan at the head, cervical spine, chest, abdomen, and pelvis was obtained noting dependent bibasilar atelectasis and/or consolidation with elements concerning for infection or aspiration superimposed on chronic scarring.  Patient has been given acetaminophen, vancomycin, and cefepime.  Review of Systems  Constitutional:  Positive for fever and malaise/fatigue.  HENT:  Positive for congestion.   Eyes:  Negative for photophobia and pain.  Respiratory:  Positive for cough, sputum production, shortness of breath and wheezing.   Cardiovascular:  Positive for leg swelling. Negative for chest pain.  Gastrointestinal:  Positive for constipation. Negative for abdominal pain, diarrhea, nausea and vomiting.  Genitourinary:  Negative for dysuria and hematuria.  Musculoskeletal:  Positive for falls. Negative for myalgias.  Skin:  Negative for rash.  Neurological:  Positive for headaches. Negative for focal weakness and loss of consciousness.  Psychiatric/Behavioral:  Negative for memory loss and substance abuse.    Past Medical History:  Diagnosis Date   Anxiety    Aortic atherosclerosis (HCC)    Arthritis    Asthma    Atrial tachycardia ([place])    Cancer (HCC)    basal skin cancer on nose   Cataract    Chronic diastolic CHF (congestive heart failure) (HCC)    Complication of anesthesia    during cervical surgery she woke up during the procedure   COPD (chronic obstructive pulmonary disease) ([place])    Depression    Diverticulosis  Family history of adverse reaction to anesthesia    mother had post op N&V   GERD (gastroesophageal reflux disease)    History of hiatal hernia    Hyperlipidemia    Hypertension    Insomnia    Macular degeneration    Migraines    Neuropathy    Obesity    PAF (paroxysmal atrial fibrillation) (HCC)     Pneumonia    Sleep apnea    history of it,not using a cpap, study 2018: didn't need cpap   Thyroid nodule 2000   radioactive caspule    Past Surgical History:  Procedure Laterality Date   ANTERIOR CERVICAL DECOMP/DISCECTOMY FUSION N/A 06/15/2020   Procedure: ANTERIOR CERVICAL DECOMPRESSION FUSION CERVICAL FIVE-CERVICAL SIX WITH INSTRUMENTATION AND ALLOGRAFT;  Surgeon: Phylliss Bob, MD;  Location: [facility];  Service: Orthopedics;  Laterality: N/A;  anterior   CERVICAL POLYPECTOMY     INSIDE AND OUTSIDE   COLONOSCOPY     EYE SURGERY Bilateral    cataracts   POLYPECTOMY     REMOVED CARTLIAGE RIB       reports that she quit smoking about 29 years ago. Her smoking use included cigarettes. She has a 20.00 pack-year smoking history. She has never used smokeless tobacco. She reports that she does not drink alcohol and does not use drugs.  Allergies  Allergen Reactions   Food Shortness Of Breath    ALLERGY= MUSHROOMS   Levaquin [Levofloxacin In D5w] Other (See Comments)    "made me want to die."   Tequin Shortness Of Breath   Erythromycin Other (See Comments)    GI UPSET   Oxycodone-Acetaminophen Itching   Codeine Nausea And Vomiting   Penicillins Rash    Reaction: Childhood   Prednisone Rash   Wellbutrin [Bupropion Hcl] Other (See Comments)    SHAKING    Family History  Problem Relation Age of Onset   Colon polyps Sister 2       PRECANCEROUS POLYPS   Breast cancer Maternal Grandmother 44   Emphysema Father    Asthma Father    Heart disease Father    Heart disease Paternal Grandfather    Colon cancer Neg Hx     Prior to Admission medications   Medication Sig Start Date End Date Taking? Authorizing Provider  acetaminophen (TYLENOL) 650 MG CR tablet Take 1,300 mg by mouth every 8 (eight) hours as needed for pain.    [provider]  albuterol (VENTOLIN HFA) 108 (90 Base) MCG/ACT inhaler Inhale 2 puffs into the lungs every 6 (six) hours as needed for wheezing or  shortness of breath. 03/05/21   Parrett, Fonnie Mu, NP  alclomethasone (ACLOVATE) 0.05 % cream Apply topically 2 (two) times daily as needed (Rash). 10/03/20   Sheffield, Kelli R, PA-C  BESIVANCE 0.6 % SUSP Place 1 drop into the left eye See admin instructions. Instill 1 drop qid day of eye injection and qid the day after. 09/07/19   [provider]  cefUROXime (CEFTIN) 500 MG tablet Take 500 mg by mouth 2 (two) times daily with a meal. For 10 days. S    [provider]  cetirizine (ZYRTEC) 10 MG tablet TAKE ONE TABLET BY MOUTH ONCE DAILY 02/04/21   Collene Gobble, MD  Cyanocobalamin (VITAMIN B-12 PO) Take 2,500 mcg by mouth daily.    [provider]  diltiazem (CARDIZEM CD) 240 MG 24 hr capsule Take 1 capsule (240 mg total) by mouth daily. 12/18/20 03/01/21  Constance Haw, MD  DULoxetine (CYMBALTA) 60 MG capsule Take 60 mg by mouth every morning. 03/23/20   [provider]  ELIQUIS 5 MG TABS tablet Take 5 mg by mouth 2 (two) times daily. 07/20/20   [provider]  flecainide (TAMBOCOR) 50 MG tablet Take 1 tablet (50 mg total) by mouth every 12 (twelve) hours. 12/18/20 03/01/21  Camnitz, Will Hassell Done, MD  fluticasone (FLONASE) 50 MCG/ACT nasal spray Place 1 spray into both nostrils daily. 07/19/16   Collene Gobble, MD  furosemide (LASIX) 40 MG tablet Take 1 tablet (40 mg total) by mouth daily. 11/18/20 03/01/21  Donne Hazel, MD  ipratropium-albuterol (DUONEB) 0.5-2.5 (3) MG/3ML SOLN INHALE contents of one vial using nebulizer TWICE DAILY Patient not taking: Reported on 03/05/2021 11/06/20   Collene Gobble, MD  LORazepam (ATIVAN) 0.5 MG tablet Take 0.5-1 mg by mouth daily as needed for anxiety. Anxiety    [provider]  metoprolol succinate (TOPROL-XL) 50 MG 24 hr tablet TAKE ONE TABLET BY MOUTH EVERYDAY AT BEDTIME 11/06/20   Jerline Pain, MD  montelukast (SINGULAIR) 10 MG tablet Take 10 mg by mouth at bedtime. 02/21/14   [provider]   Multiple Vitamins-Minerals (PRESERVISION AREDS 2+MULTI VIT) CAPS Take 1 tablet by mouth 2 (two) times daily.    [provider]  mupirocin ointment (BACTROBAN) 2 % Apply 1 application topically 2 (two) times daily. 10/03/20   Sheffield, Ronalee Red, PA-C  omeprazole (PRILOSEC) 20 MG capsule Take 20 mg by mouth at bedtime.    [provider]  ondansetron (ZOFRAN) 4 MG tablet Take 1 tablet (4 mg total) by mouth every 8 (eight) hours as needed for nausea or vomiting. 06/15/20   McKenzie, Lennie Muckle, PA-C  pravastatin (PRAVACHOL) 40 MG tablet Take 40 mg by mouth at bedtime.  09/04/10   [provider]  predniSONE (DELTASONE) 20 MG tablet Take 1 tablet (20 mg total) by mouth daily with breakfast. 03/05/21   Parrett, Fonnie Mu, NP  promethazine-dextromethorphan (PROMETHAZINE-DM) 6.25-15 MG/5ML syrup Take by mouth 4 (four) times daily as needed for cough.    [provider]  Donnal Debar 100-62.5-25 MCG/INH AEPB INHALE 1 PUFF BY MOUTH EVERY DAY 02/04/20   Collene Gobble, MD    Physical Exam:  Constitutional: Obese elderly female who appears to be acutely ill Vitals:   03/07/21 1000 03/07/21 1045 03/07/21 1100 03/07/21 1130  BP: (!) 123/59 (!) 111/55 (!) 100/45 (!) 101/54  Pulse: (!) 106 (!) 109 (!) 102 97  Resp: (!) 29 20 (!) 24 17  Temp:      TempSrc:      SpO2: 96% 92% 94% 96%  Weight:      Height:       Eyes: PERRL, lids and conjunctivae normal ENMT: Mucous membranes are moist. Posterior pharynx clear of any exudate or lesions.   Neck: normal, supple, no masses, no thyromegaly Respiratory: Mildly tachypneic with rales appreciated in both lung fields.  Patient on 2.5 L of nasal cannula oxygen with O2 saturations maintained. Cardiovascular: Regular rate and rhythm, no murmurs / rubs / gallops.  Trace bilateral lower extremity edema. 2+ pedal pulses. No carotid bruits.  Abdomen: no tenderness, no masses palpated. No hepatosplenomegaly. Bowel sounds positive.   Musculoskeletal: no clubbing / cyanosis. No joint deformity upper and lower extremities. Good ROM, no contractures. Normal muscle tone.  Skin: no rashes, lesions, ulcers. No induration Neurologic: CN 2-12 grossly intact.  Able to move all extremities Psychiatric: Normal judgment and  insight. Alert and oriented x 3. Normal mood.     Labs on Admission: I have personally reviewed following labs and imaging studies  CBC: Recent Labs  Lab 03/05/21 1628 03/07/21 0910  WBC 5.7 15.1*  NEUTROABS 3.9 12.7*  HGB 14.2 12.6  HCT 43.9 39.2  MCV 82.2 84.5  PLT 191.0 106   Basic Metabolic Panel: Recent Labs  Lab 03/05/21 1628 03/07/21 0910  NA 143 137  K 3.8 3.7  CL 102 101  CO2 31 27  GLUCOSE 90 134*  BUN 13 13  CREATININE 0.73 0.75  CALCIUM 9.3 8.3*   GFR: Estimated Creatinine Clearance: 64.4 mL/min (by C-G formula based on SCr of 0.75 mg/dL). Liver Function Tests: Recent Labs  Lab 03/07/21 0910  AST 22  ALT 15  ALKPHOS 90  BILITOT 1.6*  PROT 6.1*  ALBUMIN 3.2*   No results for input(s): LIPASE, AMYLASE in the last 168 hours. No results for input(s): AMMONIA in the last 168 hours. Coagulation Profile: Recent Labs  Lab 03/07/21 0910 03/07/21 1010  INR BLOOD/ANTICOAG RATIO IN TUBE UNSATISFACTORY 1.7*   Cardiac Enzymes: No results for input(s): CKTOTAL, CKMB, CKMBINDEX, TROPONINI in the last 168 hours. BNP (last 3 results) No results for input(s): PROBNP in the last 8760 hours. HbA1C: No results for input(s): HGBA1C in the last 72 hours. CBG: Recent Labs  Lab 03/07/21 1013  GLUCAP 124*   Lipid Profile: No results for input(s): CHOL, HDL, LDLCALC, TRIG, CHOLHDL, LDLDIRECT in the last 72 hours. Thyroid Function Tests: No results for input(s): TSH, T4TOTAL, FREET4, T3FREE, THYROIDAB in the last 72 hours. Anemia Panel: No results for input(s): VITAMINB12, FOLATE, FERRITIN, TIBC, IRON, RETICCTPCT in the last 72 hours. Urine analysis:    Component Value  Date/Time   COLORURINE AMBER (A) 06/14/2020 1145   APPEARANCEUR CLOUDY (A) 06/14/2020 1145   LABSPEC 1.027 06/14/2020 1145   PHURINE 5.0 06/14/2020 1145   GLUCOSEU NEGATIVE 06/14/2020 1145   HGBUR NEGATIVE 06/14/2020 1145   BILIRUBINUR NEGATIVE 06/14/2020 1145   KETONESUR NEGATIVE 06/14/2020 1145   PROTEINUR NEGATIVE 06/14/2020 1145   NITRITE NEGATIVE 06/14/2020 1145   LEUKOCYTESUR TRACE (A) 06/14/2020 1145   Sepsis Labs: No results found for this or any previous visit (from the past 240 hour(s)).   Radiological Exams on Admission: DG Chest 2 View  Result Date: 03/06/2021 CLINICAL DATA:  75 year old female with cough EXAM: CHEST - 2 VIEW COMPARISON:  11/16/2020, CT 11/16/2020 FINDINGS: Cardiomediastinal silhouette unchanged in size and contour. Stigmata of emphysema, with increased retrosternal airspace, flattened hemidiaphragms, increased AP diameter, and hyperinflation on the AP view. Similar appearance of coarsened interstitial markings, with increased conspicuity of reticulonodular opacities throughout. No interlobular septal thickening. No pneumothorax. No pleural effusion. No airspace disease. No displaced fracture. Degenerative changes of the spine. Surgical changes of the cervical region incompletely imaged. IMPRESSION: Mild reticulonodular opacities, may reflect atypical infection. Background of emphysema/chronic changes Electronically Signed   By: Corrie Mckusick D.O.   On: 03/06/2021 13:01   CT HEAD WO CONTRAST (5MM)  Result Date: 03/07/2021 CLINICAL DATA:  Head trauma, mod-severe EXAM: CT HEAD WITHOUT CONTRAST TECHNIQUE: Contiguous axial images were obtained from the base of the skull through the vertex without intravenous contrast. COMPARISON:  None. FINDINGS: Brain: There is no acute intracranial hemorrhage, mass effect, or edema. Gray-white differentiation is preserved. There is no extra-axial fluid collection. Ventricles and sulci are within normal limits in size and  configuration. Patchy low-attenuation in the supratentorial white matter is nonspecific but  may reflect chronic microvascular ischemic changes. Vascular: There is atherosclerotic calcification at the skull base. Skull: Calvarium is unremarkable. Sinuses/Orbits: No acute finding. Other: None. IMPRESSION: No evidence of acute intracranial injury. Electronically Signed   By: Macy Mis M.D.   On: 03/07/2021 11:09   CT Cervical Spine Wo Contrast  Result Date: 03/07/2021 CLINICAL DATA:  Neck trauma, mechanically unstable spine (Age >= 16y) EXAM: CT CERVICAL SPINE WITHOUT CONTRAST TECHNIQUE: Multidetector CT imaging of the cervical spine was performed without intravenous contrast. Multiplanar CT image reconstructions were also generated. COMPARISON:  None. FINDINGS: Alignment: No significant listhesis. Skull base and vertebrae: Postoperative changes of ACDF at C5-C6. Vertebral body heights are maintained. No acute fracture. Soft tissues and spinal canal: No prevertebral fluid or swelling. No visible canal hematoma. Disc levels:  Mild degenerative changes are present. Upper chest: No apical lung mass. Other: Mild calcified plaque at the common carotid bifurcations. IMPRESSION: No acute cervical spine fracture. Electronically Signed   By: Macy Mis M.D.   On: 03/07/2021 11:13   CT CHEST ABDOMEN PELVIS W CONTRAST  Result Date: 03/07/2021 CLINICAL DATA:  Fall yesterday EXAM: CT CHEST, ABDOMEN, AND PELVIS WITH CONTRAST TECHNIQUE: Multidetector CT imaging of the chest, abdomen and pelvis was performed following the standard protocol during bolus administration of intravenous contrast. CONTRAST:  71mL OMNIPAQUE IOHEXOL 300 MG/ML  SOLN COMPARISON:  CT chest angiogram, 11/16/2020, CT abdomen pelvis 12/01/2015 FINDINGS: CT CHEST FINDINGS Cardiovascular: Aortic atherosclerosis normal heart size. No pericardial effusion. Mediastinum/Nodes: No enlarged mediastinal, hilar, or axillary lymph nodes. Thyroid gland,  trachea, and esophagus demonstrate no significant findings. Lungs/Pleura: Minimal centrilobular and paraseptal. Dependent bibasilar atelectasis and/or consolidation, with underlying elements of bandlike scarring and architectural distortion. No pleural effusion or pneumothorax. Musculoskeletal: No chest wall mass or suspicious bone lesions identified. CT ABDOMEN PELVIS FINDINGS Hepatobiliary: No solid liver abnormality is seen. Hepatic steatosis. No gallstones, gallbladder wall thickening, or biliary dilatation. Pancreas: Unremarkable. No pancreatic ductal dilatation or surrounding inflammatory changes. Spleen: Normal in size without significant abnormality. Adrenals/Urinary Tract: Adrenal glands are unremarkable. Kidneys are normal, without renal calculi, solid lesion, or hydronephrosis. Bladder is unremarkable. Stomach/Bowel: Stomach is within normal limits. Appendix appears normal. No evidence of bowel wall thickening, distention, or inflammatory changes. Sigmoid diverticula. Vascular/Lymphatic: Aortic atherosclerosis. No enlarged abdominal or pelvic lymph nodes. Reproductive: No mass or other abnormality. Other: No abdominal wall hernia or abnormality. No abdominopelvic ascites. Musculoskeletal: No acute or significant osseous findings. IMPRESSION: 1. No CT evidence of acute traumatic injury to the chest, abdomen, or pelvis. 2. Dependent bibasilar atelectasis and/or consolidation, with underlying elements of bandlike scarring and architectural distortion, findings concerning for infection or aspiration superimposed on chronic scarring. Aortic Atherosclerosis (ICD10-I70.0). Electronically Signed   By: Delanna Ahmadi M.D.   On: 03/07/2021 11:14   DG Chest Port 1 View  Result Date: 03/07/2021 CLINICAL DATA:  Reason for exam: questionable sepsis. Pt has productive cough and congestion cough, congestion, question sepsis EXAM: PORTABLE CHEST - 1 VIEW COMPARISON:  03/05/2021 FINDINGS: Low lung volumes. Crowding of  bronchovascular structures at the lung bases. No confluent airspace disease. Heart size upper limits normal. Aortic Atherosclerosis (ICD10-170.0). No definite effusion.  No pneumothorax. Left shoulder DJD. IMPRESSION: Low volumes.  No acute disease. Electronically Signed   By: Lucrezia Europe M.D.   On: 03/07/2021 10:13    EKG: Independently reviewed.  Sinus tachycardia 105 bpm  Assessment/Plan Acute on chronic respiratory failure with hypoxia due to Sepsis secondary to community-acquired pneumonia: Patient presents with  complaints of productive cough since recently having COVID-19 infection 1 month ago.  Noted to be febrile up to 101.7 F with tachycardia, tachypnea, WBC 15.1, and lactic acid 2.1 meeting SIRS criteria.  O2 saturations were noted to be as low as 88% on room air with improvement on 2.5 L nasal cannula oxygen greater than 90%.  CT imaging noted dependent bibasilar atelectasis and/or consolidation concerning for infection versus aspiration.  Patient had no improvement on doxycycline and Ceftin. -Admit to a telemetry bed -Continuous pulse oximetry with nasal cannula oxygen maintain O2 saturation greater than 92% -Follow-up blood, urine, and sputum culture -Check a procalcitonin -Check nasal MRSA screen -Flutter valve and incentive spirometry every 4 hours -Empiric antibiotics of vancomycin and cefepime -Acetaminophen as needed for fever -Mucinex -Trend lactic acid -Recheck CBC tomorrow  Acute metabolic encephalopathy: Resolving.  Patient appears to be alert and oriented x3 at this time, but had initially been noted to be altered.  Suspect secondary to underlying infection and/or hypoxia. -Neurochecks  COPD, without acute exacerbation: Patient reports that she has intermittently been wheezing, but no active wheezing noted on physical exam at this time. -Continue Trelegy  Ellipta, Singulair, and pharmacy substitution of Claritin  Transient hypotension: On admission blood pressures were  noted to be as low as 100/45.  Patient had been given 500 mL bolus of lactated Ringer's. Blood pressures now maintained with maps greater than 65. -Bolus 1500 mL of  IV fluids given prior history of congestive heart -Goal MAP greater than 65 -Initially did not reorder furosemide, metoprolol, and diltiazem due to initial hypotension. Continue to reassess and determine restart when medically appropriate to receive  Coronary artery disease -Continue aspirin and statin  Paroxysmal atrial fibrillation on chronic anticoagulation: Patient appears to be in sinus rhythm at this time. -Continue flecainide and Eliquis  Diastolic congestive heart failure: Patient does not appear grossly fluid overloaded at this time.  Last EF noted to be 65 -70% in 10/2020. -Strict Intake and output -Daily weights  Anxiety and depression -Continue Cymbalta and Ativan as needed  Hyperlipidemia -Continue pravastatin  History of COVID-19 infection: Patient reported being positive for COVID-19 last month.  DNR: Present on admission  DVT prophylaxis: Eliquis Code Status: DNR Family Communication: Daughter updated over the phone. Disposition Plan: Hopefully discharge home once medically stable Consults called: None Admission status: Inpatient, require more than 2 midnight stay  Norval Morton MD [facility]   If 7PM-7AM, please contact night-coverage   03/07/2021, 11:34 AM

## 2021-03-07 NOTE — Progress Notes (Signed)
NEW ADMISSION NOTE New Admission Note:   Arrival Method: ED stretcher Mental Orientation: AAOx3 Telemetry: 43M 19 Assessment: Completed Skin: WNL IV: LFA Pain: 9/10 Tubes: n/a Safety Measures: Safety Fall Prevention Plan has been given, discussed and signed Admission: Completed [facility] Orientation: Patient has been orientated to the room, unit and staff.  Family: no family at bedside  Orders have been reviewed and implemented. Will continue to monitor the patient. Call light has been placed within reach and bed alarm has been activated.   Vira Agar, RN

## 2021-03-07 NOTE — Sepsis Progress Note (Signed)
Secure chat with bedside RN Peter Congo to confirm that the blood cultures were definitely drawn before the antibiotic was given.

## 2021-03-07 NOTE — ED Triage Notes (Signed)
Pt BIB EMS from home c daughter - pt fell at home yesterday but did not tell daughter - takes thinners, denies hitting head, states she hit her arm and ribs on R side.   This AM daughter calls EMS for increased confusion, pt asking bizarre questions and disoriented. Found to have 102 degree fever by daughter. EMS reports pt was confused during transport. Pt has has productive cough x1 month after covid in Sept. C/O head pressure, cough and confusion.

## 2021-03-07 NOTE — Sepsis Progress Note (Signed)
Notified provider of need to order fluid bolus as MAP<65 twice, or to provide documentation as to why to hold the fluid.

## 2021-03-07 NOTE — ED Provider Notes (Signed)
[facility] EMERGENCY DEPARTMENT Provider Note   CSN: [id number] Arrival date & time: 03/07/21  9323     History Chief Complaint  Patient presents with   Altered Mental Status   Fall    [name] Molina is a 75 y.o. female who presents emergency department with chief complaint of altered mental status.  Patient was sent in by her daughter with whom she lives for increasing confusion over the past 2 days.  The patient reports that 1 month ago she had COVID and she has had a persistent cough which has been worsening.  She has noticed blood-tinged sputum.  She was found to be febrile and hypoxic upon arrival.  Patient also tachycardic with confusion and meet sepsis criteria.  Patient states that yesterday she also fell and complains of bilateral abdominal pain.  She is on Eliquis.  She denies urinary symptoms, nausea, vomiting, wounds or other sources of infection.   Altered Mental Status Fall      Past Medical History:  Diagnosis Date   Anxiety    Aortic atherosclerosis ([place])    Arthritis    Asthma    Atrial tachycardia ([place])    Cancer (HCC)    basal skin cancer on nose   Cataract    Chronic diastolic CHF (congestive heart failure) (HCC)    Complication of anesthesia    during cervical surgery she woke up during the procedure   COPD (chronic obstructive pulmonary disease) ([place])    Depression    Diverticulosis    Family history of adverse reaction to anesthesia    mother had post op N&V   GERD (gastroesophageal reflux disease)    History of hiatal hernia    Hyperlipidemia    Hypertension    Insomnia    Macular degeneration    Migraines    Neuropathy    Obesity    PAF (paroxysmal atrial fibrillation) ([place])    Pneumonia    Sleep apnea    history of it,not using a cpap, study 2018: didn't need cpap   Thyroid nodule 2000   radioactive caspule    Patient Active Problem List   Diagnosis Date Noted   Abnormal CT scan, chest 03/05/2021   Anxiety     Hypokalemia    Chronic respiratory failure with hypoxia ([place]) 09/29/2020   Paroxysmal atrial fibrillation ([place]) 08/13/2020   Major depressive disorder 08/13/2020   Chronic diastolic CHF (congestive heart failure) ([place]) 08/13/2020   Elevated troponin level not due myocardial infarction 08/13/2020   Atrial fibrillation with RVR ([place]) 09/18/2019   Edema of both lower extremities 05/07/2018   Pulmonary nodule 12/01/2017   Dyspnea 11/26/2017   RUQ abdominal pain 11/27/2015   Suprapubic pain 11/27/2015   Dysphagia 11/27/2015   Abdominal mass, RUQ (right upper quadrant) 11/27/2015   Obstructive sleep apnea 04/06/2015   PAT (paroxysmal atrial tachycardia) ([place]) 08/09/2014   GERD without esophagitis    Mixed hyperlipidemia    Spinal stenosis of lumbar region 03/16/2014   Idiopathic peripheral neuropathy 03/16/2014   Hypertension 06/13/2011   Allergic rhinitis 06/13/2011   Chronic headaches 06/13/2011   COPD (chronic obstructive pulmonary disease) ([place]) 06/13/2011    Past Surgical History:  Procedure Laterality Date   ANTERIOR CERVICAL DECOMP/DISCECTOMY FUSION N/A 06/15/2020   Procedure: ANTERIOR CERVICAL DECOMPRESSION FUSION CERVICAL FIVE-CERVICAL SIX WITH INSTRUMENTATION AND ALLOGRAFT;  Surgeon: Phylliss Bob, MD;  Location: [place];  Service: Orthopedics;  Laterality: N/A;  anterior   CERVICAL POLYPECTOMY     INSIDE  AND OUTSIDE   COLONOSCOPY     EYE SURGERY Bilateral    cataracts   POLYPECTOMY     REMOVED CARTLIAGE RIB       OB History   No obstetric history on file.     Family History  Problem Relation Age of Onset   Colon polyps Sister 52       PRECANCEROUS POLYPS   Breast cancer Maternal Grandmother 49   Emphysema Father    Asthma Father    Heart disease Father    Heart disease Paternal Grandfather    Colon cancer Neg Hx     Social History   Tobacco Use   Smoking status: Former    Packs/day: 1.00    Years: 20.00    Pack years: 20.00    Types: Cigarettes     Quit date: 05/21/1991    Years since quitting: 29.8   Smokeless tobacco: Never  Vaping Use   Vaping Use: Never used  Substance Use Topics   Alcohol use: No   Drug use: No    Home Medications Prior to Admission medications   Medication Sig Start Date End Date Taking? Authorizing Provider  acetaminophen (TYLENOL) 650 MG CR tablet Take 1,300 mg by mouth every 8 (eight) hours as needed for pain.    [provider]  albuterol (VENTOLIN HFA) 108 (90 Base) MCG/ACT inhaler Inhale 2 puffs into the lungs every 6 (six) hours as needed for wheezing or shortness of breath. 03/05/21   Parrett, Fonnie Mu, NP  alclomethasone (ACLOVATE) 0.05 % cream Apply topically 2 (two) times daily as needed (Rash). 10/03/20   Sheffield, Kelli R, PA-C  BESIVANCE 0.6 % SUSP Place 1 drop into the left eye See admin instructions. Instill 1 drop qid day of eye injection and qid the day after. 09/07/19   [provider]  cefUROXime (CEFTIN) 500 MG tablet Take 500 mg by mouth 2 (two) times daily with a meal. For 10 days. S    [provider]  cetirizine (ZYRTEC) 10 MG tablet TAKE ONE TABLET BY MOUTH ONCE DAILY 02/04/21   Collene Gobble, MD  Cyanocobalamin (VITAMIN B-12 PO) Take 2,500 mcg by mouth daily.    [provider]  diltiazem (CARDIZEM CD) 240 MG 24 hr capsule Take 1 capsule (240 mg total) by mouth daily. 12/18/20 03/01/21  Camnitz, Ocie Doyne, MD  DULoxetine (CYMBALTA) 60 MG capsule Take 60 mg by mouth every morning. 03/23/20   [provider]  ELIQUIS 5 MG TABS tablet Take 5 mg by mouth 2 (two) times daily. 07/20/20   [provider]  flecainide (TAMBOCOR) 50 MG tablet Take 1 tablet (50 mg total) by mouth every 12 (twelve) hours. 12/18/20 03/01/21  Camnitz, Will Hassell Done, MD  fluticasone (FLONASE) 50 MCG/ACT nasal spray Place 1 spray into both nostrils daily. 07/19/16   Collene Gobble, MD  furosemide (LASIX) 40 MG tablet Take 1 tablet (40 mg total) by mouth daily. 11/18/20 03/01/21   Donne Hazel, MD  ipratropium-albuterol (DUONEB) 0.5-2.5 (3) MG/3ML SOLN INHALE contents of one vial using nebulizer TWICE DAILY Patient not taking: Reported on 03/05/2021 11/06/20   Collene Gobble, MD  LORazepam (ATIVAN) 0.5 MG tablet Take 0.5-1 mg by mouth daily as needed for anxiety. Anxiety    [provider]  metoprolol succinate (TOPROL-XL) 50 MG 24 hr tablet TAKE ONE TABLET BY MOUTH EVERYDAY AT BEDTIME 11/06/20   Jerline Pain, MD  montelukast (SINGULAIR) 10 MG tablet Take 10  mg by mouth at bedtime. 02/21/14   [provider]  Multiple Vitamins-Minerals (PRESERVISION AREDS 2+MULTI VIT) CAPS Take 1 tablet by mouth 2 (two) times daily.    [provider]  mupirocin ointment (BACTROBAN) 2 % Apply 1 application topically 2 (two) times daily. 10/03/20   Sheffield, Ronalee Red, PA-C  omeprazole (PRILOSEC) 20 MG capsule Take 20 mg by mouth at bedtime.    [provider]  ondansetron (ZOFRAN) 4 MG tablet Take 1 tablet (4 mg total) by mouth every 8 (eight) hours as needed for nausea or vomiting. 06/15/20   McKenzie, Lennie Muckle, PA-C  pravastatin (PRAVACHOL) 40 MG tablet Take 40 mg by mouth at bedtime.  09/04/10   [provider]  predniSONE (DELTASONE) 20 MG tablet Take 1 tablet (20 mg total) by mouth daily with breakfast. 03/05/21   Parrett, Fonnie Mu, NP  promethazine-dextromethorphan (PROMETHAZINE-DM) 6.25-15 MG/5ML syrup Take by mouth 4 (four) times daily as needed for cough.    [provider]  Donnal Debar 100-62.5-25 MCG/INH AEPB INHALE 1 PUFF BY MOUTH EVERY DAY 02/04/20   Collene Gobble, MD    Allergies    Food, Levaquin [levofloxacin in d5w], Tequin, Erythromycin, Oxycodone-acetaminophen, Codeine, Penicillins, Prednisone, and Wellbutrin [bupropion hcl]  Review of Systems   Review of Systems Ten systems reviewed and are negative for acute change, except as noted in the HPI.   Physical Exam Updated Vital Signs BP 131/61 (BP Location: Right  Arm)   Pulse (!) 103   Temp (S) (!) 101.7 F (38.7 C) (Rectal)   Resp (!) 23   Ht 5\' 2"  (1.575 m)   Wt 95.3 kg   SpO2 (S) 95%   BMI 38.41 kg/m   Physical Exam Vitals and nursing note reviewed.  Constitutional:      Appearance: She is ill-appearing and toxic-appearing.  HENT:     Head: Normocephalic and atraumatic.     Right Ear: Tympanic membrane normal.     Left Ear: Tympanic membrane normal.     Nose: Nose normal.     Mouth/Throat:     Mouth: Mucous membranes are dry.  Eyes:     Extraocular Movements: Extraocular movements intact.     Conjunctiva/sclera: Conjunctivae normal.     Pupils: Pupils are equal, round, and reactive to light.  Cardiovascular:     Rate and Rhythm: Tachycardia present.     Pulses: Normal pulses.  Pulmonary:     Effort: Tachypnea present. No accessory muscle usage or respiratory distress.     Breath sounds: Rhonchi present.  Abdominal:     General: There is no distension.     Tenderness: There is abdominal tenderness.  Musculoskeletal:        General: Normal range of motion.     Cervical back: Normal range of motion and neck supple.  Neurological:     Mental Status: She is alert and oriented to person, place, and time.    ED Results / Procedures / Treatments   Labs (all labs ordered are listed, but only abnormal results are displayed) Labs Reviewed  COMPREHENSIVE METABOLIC PANEL  CBC  PROTIME-INR  LACTIC ACID, PLASMA  CBG MONITORING, ED    EKG None  Radiology DG Chest 2 View  Result Date: 03/06/2021 CLINICAL DATA:  75 year old female with cough EXAM: CHEST - 2 VIEW COMPARISON:  11/16/2020, CT 11/16/2020 FINDINGS: Cardiomediastinal silhouette unchanged in size and contour. Stigmata of emphysema, with increased retrosternal airspace, flattened hemidiaphragms, increased AP diameter, and hyperinflation on the  AP view. Similar appearance of coarsened interstitial markings, with increased conspicuity of reticulonodular opacities  throughout. No interlobular septal thickening. No pneumothorax. No pleural effusion. No airspace disease. No displaced fracture. Degenerative changes of the spine. Surgical changes of the cervical region incompletely imaged. IMPRESSION: Mild reticulonodular opacities, may reflect atypical infection. Background of emphysema/chronic changes Electronically Signed   By: Corrie Mckusick D.O.   On: 03/06/2021 13:01    Procedures .Critical Care E&M Performed by: Margarita Mail, PA-C  Critical care provider statement:    Critical care time (minutes):  60   Critical care time was exclusive of:  Separately billable procedures and treating other patients   Critical care was necessary to treat or prevent imminent or life-threatening deterioration of the following conditions:  Sepsis   Critical care was time spent personally by me on the following activities:  Development of treatment plan with patient or surrogate, discussions with consultants, evaluation of patient's response to treatment, examination of patient, interpretation of cardiac output measurements, review of old charts, re-evaluation of patient's condition, pulse oximetry, ordering and review of radiographic studies, ordering and review of laboratory studies and ordering and performing treatments and interventions After initial E/M assessment, critical care services were subsequently performed that were exclusive of separately billable procedures or treatment.     Medications Ordered in ED Medications - No data to display  ED Course  I have reviewed the triage vital signs and the nursing notes.  Pertinent labs & imaging results that were available during my care of the patient were reviewed by me and considered in my medical decision making (see chart for details).  Clinical Course as of 03/07/21 1202  Wed Mar 07, 2458  6073 75 year old female with a history of paroxysmal A. fib on Eliquis, COPD on 2 L nasal cannula baseline, congestive heart  failure, presenting to ED with productive cough, fever, malaise, worsening shortness of breath.  The patient reports that she tested positive for COVID about 1 month ago.  She says for the past several days she has had worsening cough, weakness, fatigue, and also confusion today.  She says she had a fall earlier today or yesterday and is having pain in her right lower ribs, worse with movement and inspiration.  She feels fatigued and describes fevers and poor appetite.  She is febrile, tachycardic, elevated white blood cell count on arrival here.  She has a very coarse productive cough and reports dark sputum.  Rhonchi on her right lower lobe.  She is on 2 L nasal cannula which is her baseline requirement.  We have initiated sepsis protocol based on SIRS criteria and likely source as pneumonia.  We have ordered IV fluids and IV antibiotics.  I personally reviewed her chest x-ray which I agree shows low lung volumes, limited view of single view.  She will likely need CT imaging of the chest to look for both pneumonia and rib fracture, she is also having some nondescript abdominal tenderness, and I think it be reasonable to CT the abdomen as well to ensure there is not additional source of infection [MT]  1024 Will add influenza swab.  Pt likely to still be covid positive on PCR today if she had covid 1 month ago. [MT]  61 CT Cervical Spine Wo Contrast [AH]    Clinical Course User Index [AH] Margarita Mail, PA-C [MT] Langston Masker, Carola Rhine, MD   MDM Rules/Calculators/A&P  CC:AMS  GB:[id number] is gathered by patient  and emr. Previous records obtained and reviewed. DDX:The patient's complaint of ams involves an extensive number of diagnostic and treatment options, and is a complaint that carries with it a high risk of complications, morbidity, and potential mortality. Given the large differential diagnosis, medical decision making is of high complexity. The differential diagnosis for AMS  is extensive and includes, but is not limited to: drug overdose - opioids, alcohol, sedatives, antipsychotics, drug withdrawal, others; Metabolic: hypoxia, hypoglycemia, hyperglycemia, hypercalcemia, hypernatremia, hyponatremia, uremia, hepatic encephalopathy, hypothyroidism, hyperthyroidism, vitamin B12 or thiamine deficiency, carbon monoxide poisoning, Wilson's disease, Lactic acidosis, DKA/HHOS; Infectious: meningitis, encephalitis, bacteremia/sepsis, urinary tract infection, pneumonia, neurosyphilis; Structural: Space-occupying lesion, (brain tumor, subdural hematoma, hydrocephalus,); Vascular: stroke, subarachnoid hemorrhage, coronary ischemia, hypertensive encephalopathy, CNS vasculitis, thrombotic thrombocytopenic purpura, disseminated intravascular coagulation, hyperviscosity; Psychiatric: Schizophrenia, depression; Other: Seizure, hypothermia, heat stroke, ICU psychosis, dementia -"sundowning."  Labs: I ordered reviewed and interpreted labs which include Cbc- leukocytosis Lacitic acid elevated to 2.1 CMP with mildly elevated blood glucose Respiratory panel is negative for influenza, INR slightly elevated. Imaging: I ordered and reviewed images which included 1 view chest x-ray, CT head and C-spine, CT chest abdomen and pelvis with contrast. I independently visualized and interpreted all imaging. Significant findings include bilateral lower lobe pneumonia right greater than left. There are no acute, significant findings on the remainder of today's images  EKG: SInus Tachycardia at a rate of 105 Consults:Dr. Fuller Plan MCE:[id number] here with c/o AMS She appears to be septic due to pneumonia.  Patient requiring oxygen supplementation.  Patient given fluid and broad-spectrum antibiotics, treated under sepsis protocol.  Patient will need admission to the hospital. Patient disposition:The patient appears reasonably stabilized for admission considering the current resources, flow, and capabilities  available in the ED at this time, and I doubt any other [facility] requiring further screening and/or treatment in the ED prior to admission.         Final Clinical Impression(s) / ED Diagnoses Final diagnoses:  None    Rx / DC Orders ED Discharge Orders     None        Margarita Mail, PA-C 03/07/21 1309    Wyvonnia Dusky, MD 03/07/21 1747

## 2021-03-07 NOTE — Plan of Care (Signed)
  Problem: Activity: Goal: Ability to tolerate increased activity will improve Outcome: Progressing   Problem: Clinical Measurements: Goal: Ability to maintain a body temperature in the normal range will improve Outcome: Progressing   

## 2021-03-08 LAB — CBC
HCT: 34.4 % — ABNORMAL LOW (ref 36.0–46.0)
Hemoglobin: 11.1 g/dL — ABNORMAL LOW (ref 12.0–15.0)
MCH: 27.4 pg (ref 26.0–34.0)
MCHC: 32.3 g/dL (ref 30.0–36.0)
MCV: 84.9 fL (ref 80.0–100.0)
Platelets: 127 10*3/uL — ABNORMAL LOW (ref 150–400)
RBC: 4.05 MIL/uL (ref 3.87–5.11)
RDW: 16.3 % — ABNORMAL HIGH (ref 11.5–15.5)
WBC: 7 10*3/uL (ref 4.0–10.5)
nRBC: 0 % (ref 0.0–0.2)

## 2021-03-08 LAB — COMPREHENSIVE METABOLIC PANEL
ALT: 13 U/L (ref 0–44)
AST: 14 U/L — ABNORMAL LOW (ref 15–41)
Albumin: 2.7 g/dL — ABNORMAL LOW (ref 3.5–5.0)
Alkaline Phosphatase: 78 U/L (ref 38–126)
Anion gap: 8 (ref 5–15)
BUN: 11 mg/dL (ref 8–23)
CO2: 25 mmol/L (ref 22–32)
Calcium: 8.2 mg/dL — ABNORMAL LOW (ref 8.9–10.3)
Chloride: 105 mmol/L (ref 98–111)
Creatinine, Ser: 0.75 mg/dL (ref 0.44–1.00)
GFR, Estimated: >60 mL/min (ref >60)
Glucose, Bld: 123 mg/dL — ABNORMAL HIGH (ref 70–99)
Potassium: 3.3 mmol/L — ABNORMAL LOW (ref 3.5–5.1)
Sodium: 138 mmol/L (ref 135–145)
Total Bilirubin: 1.5 mg/dL — ABNORMAL HIGH (ref 0.3–1.2)
Total Protein: 5.2 g/dL — ABNORMAL LOW (ref 6.5–8.1)

## 2021-03-08 LAB — LEGIONELLA PNEUMOPHILA SEROGP 1 UR AG: L. pneumophila Serogp 1 Ur Ag: NEGATIVE

## 2021-03-08 MED ORDER — FUROSEMIDE 10 MG/ML IJ SOLN
20.0000 mg | Freq: Every day | INTRAMUSCULAR | Status: DC
  Administered 2021-03-08 – 2021-03-10 (×3): 20 mg via INTRAVENOUS
  Filled 2021-03-08 (×3): qty 2

## 2021-03-08 MED ORDER — GUAIFENESIN-DM 100-10 MG/5ML PO SYRP
15.0000 mL | ORAL_SOLUTION | ORAL | Status: DC | PRN
  Administered 2021-03-15: 15 mL via ORAL
  Filled 2021-03-08: qty 15

## 2021-03-08 MED ORDER — BENZONATATE 100 MG PO CAPS
100.0000 mg | ORAL_CAPSULE | Freq: Three times a day (TID) | ORAL | Status: DC | PRN
  Administered 2021-03-10 – 2021-03-11 (×2): 100 mg via ORAL
  Filled 2021-03-08 (×2): qty 1

## 2021-03-08 MED ORDER — POTASSIUM CHLORIDE CRYS ER 20 MEQ PO TBCR
40.0000 meq | EXTENDED_RELEASE_TABLET | Freq: Once | ORAL | Status: AC
  Administered 2021-03-08: 40 meq via ORAL
  Filled 2021-03-08: qty 2

## 2021-03-08 NOTE — Progress Notes (Signed)
Nutrition Brief Note  Patient identified on the Malnutrition Screening Tool (MST) Report.  Admitting Dx: Trauma [P71.06YI] Toxic metabolic encephalopathy [R48.5] Sepsis due to pneumonia ([place]) [J18.9, A41.9] PMH:  Past Medical History:  Diagnosis Date   Anxiety    Aortic atherosclerosis (HCC)    Arthritis    Asthma    Atrial tachycardia ([place])    Cancer (HCC)    basal skin cancer on nose   Cataract    Chronic diastolic CHF (congestive heart failure) (HCC)    Complication of anesthesia    during cervical surgery she woke up during the procedure   COPD (chronic obstructive pulmonary disease) (HCC)    Depression    Diverticulosis    Family history of adverse reaction to anesthesia    mother had post op N&V   GERD (gastroesophageal reflux disease)    History of hiatal hernia    Hyperlipidemia    Hypertension    Insomnia    Macular degeneration    Migraines    Neuropathy    Obesity    PAF (paroxysmal atrial fibrillation) (HCC)    Pneumonia    Sleep apnea    history of it,not using a cpap, study 2018: didn't need cpap   Thyroid nodule 2000   radioactive caspule   Medications:  Scheduled Meds:  apixaban  5 mg Oral BID   aspirin EC  81 mg Oral QHS   DULoxetine  60 mg Oral Daily   flecainide  50 mg Oral Q12H   fluticasone furoate-vilanterol  1 puff Inhalation Daily   And   umeclidinium bromide  1 puff Inhalation Daily   furosemide  20 mg Intravenous Daily   loratadine  10 mg Oral Daily   montelukast  10 mg Oral QHS   pravastatin  40 mg Oral QHS   sodium chloride flush  3 mL Intravenous Q12H  Continuous Infusions:  ceFEPime (MAXIPIME) IV Stopped (03/08/21 0951)   vancomycin 200 mL/hr at 03/08/21 1037   Labs: Recent Labs  Lab 03/05/21 1628 03/07/21 0910 03/08/21 0427  NA 143 137 138  K 3.8 3.7 3.3*  CL 102 101 105  CO2 31 27 25   BUN 13 13 11   CREATININE 0.73 0.75 0.75  CALCIUM 9.3 8.3* 8.2*  GLUCOSE 90 134* 123*   Wt Readings from Last 15 Encounters:   03/07/21 95.3 kg  03/05/21 96.5 kg  03/01/21 97.4 kg  11/23/20 98 kg  11/17/20 96.8 kg  11/01/20 96.2 kg  09/29/20 97.9 kg  08/13/20 100.9 kg  06/15/20 100.5 kg  06/14/20 100.5 kg  05/22/20 96.6 kg  04/04/20 96.3 kg  03/08/20 95.7 kg  03/01/20 96.2 kg  11/03/19 102.2 kg  Pt denies any recent changes to weight. Reports UBW of ~210 lbs and pt currently ~209 lbs.  Body mass index is 38.41 kg/m. Patient meets criteria for obesity based on current BMI.   Current diet order is heart healthy, patient is consuming approximately 100% of meals at this time.  No nutrition interventions warranted at this time. If nutrition issues arise, please consult RD.   [name] Ina, MS, RD, LDN (she/her/hers) RD pager number and weekend/on-call pager number located in [place].

## 2021-03-08 NOTE — Progress Notes (Signed)
PROGRESS NOTE    [name] Molina  YJE:[id number] DOB: [birth year] DOA: 03/07/2021 PCP: Harlan Stains, MD    Chief Complaint  Patient presents with   Altered Mental Status   Fall    Brief Narrative:    [name] Molina is a 75 y.o. female with medical history significant of COPD with asthma, chronic respiratory failure, diastolic congestive heart failure, paroxysmal atrial fibrillation on chronic anticoagulation, and recent COVID-19 infection who presents with increasing confusion. She had been evaluated by her PCP for the symptoms and had taken 8 days of a 10-day course of doxycycline and then subsequently prescribed a 10-day course of Ceftin without any significant improvement in symptoms.    CT scan at the head, cervical spine, chest, abdomen, and pelvis was obtained noting dependent bibasilar atelectasis and/or consolidation with elements concerning for infection or aspiration superimposed on chronic scarring.  She was started on IV antibiotics and admitted.    Assessment & Plan:   Principal Problem:   Sepsis due to pneumonia [facility]) Active Problems:   COPD (chronic obstructive pulmonary disease) (HCC)   Mixed hyperlipidemia   Acute on chronic respiratory failure with hypoxia (HCC)   Paroxysmal atrial fibrillation (HCC)   Chronic diastolic CHF (congestive heart failure) (HCC)   Anxiety   Acute metabolic encephalopathy   Transient hypotension   DNR (do not resuscitate)   Acute on chronic respiratory failure currently requiring up to 3 lit of [place] oxygen.  -probably secondary to CAP.  - she failed 2 courses of antibiotics.  - on admission she was febrile, tachycardic, tachypnea, elevated wbc and elevated lactic acid.  - started on IV antibiotics, continue with duonebs.  - follow up culture reports.  - [place] oxygen to keep sats greater than 90%.    Acute metabolic encephalopathy probably from acute respiratory failure with hypoxia:  - resolved.  - she is alert and oriented,     COPD:  No wheezing heard on exam.  Continue with trelegy, ellipta, singular .    CAD: Continue with aspirin and statin.    Paroxysmal atrial fibrillation  - in sinus rhythm .  Continue with flecainide .  Continue with eliquis for anti coagulation.    Chronic Diastolic heart failure:  - crackles on exam.  One dose of lasix ordered.  Continue with strict intake and output.    Anxiety and depression:  Continue with home meds.   Hypokalemia:  Replaced.     DVT prophylaxis: eliquis. Code Status: DNR. Family Communication: none at bedside. Discussed with daughter over the phone.  Disposition:   Status is: Inpatient  Remains inpatient appropriate because: requiring IV antibiotics.        Consultants:  None.   Procedures: None.  Antimicrobials:  Antibiotics Given (last 72 hours)     Date/Time Action Medication Dose Rate   03/07/21 0957 New Bag/Given   ceFEPIme (MAXIPIME) 2 g in sodium chloride 0.9 % 100 mL IVPB 2 g 200 mL/hr   03/07/21 1111 New Bag/Given   vancomycin (VANCOREADY) IVPB 2000 mg/400 mL 2,000 mg 200 mL/hr   03/07/21 1844 New Bag/Given   ceFEPIme (MAXIPIME) 2 g in sodium chloride 0.9 % 100 mL IVPB 2 g 200 mL/hr   03/08/21 0221 New Bag/Given   ceFEPIme (MAXIPIME) 2 g in sodium chloride 0.9 % 100 mL IVPB 2 g 200 mL/hr   03/08/21 0920 New Bag/Given   ceFEPIme (MAXIPIME) 2 g in sodium chloride 0.9 % 100 mL IVPB 2 g 200 mL/hr  03/08/21 1034 New Bag/Given   vancomycin (VANCOCIN) IVPB 1000 mg/200 mL premix 1,000 mg 200 mL/hr   03/08/21 1741 New Bag/Given   ceFEPIme (MAXIPIME) 2 g in sodium chloride 0.9 % 100 mL IVPB 2 g 200 mL/hr         Subjective: Still coughing.   Objective: Vitals:   03/07/21 2144 03/08/21 0443 03/08/21 0911 03/08/21 0947  BP: 117/64 (!) 119/45  (!) 121/47  Pulse: 99 97 (!) 101 94  Resp:  17 16 18   Temp: 97.7 F (36.5 C) 98.2 F (36.8 C)  98.6 F (37 C)  TempSrc: Oral   Oral  SpO2: 94% 95% 94% 96%  Weight:       Height:        Intake/Output Summary (Last 24 hours) at 03/08/2021 1219 Last data filed at 03/08/2021 1212 Gross per 24 hour  Intake 2792.78 ml  Output 850 ml  Net 1942.78 ml   Filed Weights   03/07/21 0857  Weight: 95.3 kg    Examination:  General exam: Appears calm and comfortable  Respiratory system: scattered rhonchi at bases. On 3lit of [place] oxygen.  Cardiovascular system: S1 & S2 heard, RRR. No JVD,  No pedal edema. Gastrointestinal system: Abdomen is nondistended, soft and nontender.  Normal bowel sounds heard. Central nervous system: Alert and oriented. No focal neurological deficits. Extremities: Symmetric 5 x 5 power. Skin: No rashes, lesions or ulcers Psychiatry: Mood & affect appropriate.     Data Reviewed: I have personally reviewed following labs and imaging studies  CBC: Recent Labs  Lab 03/05/21 1628 03/07/21 0910 03/08/21 0427  WBC 5.7 15.1* 7.0  NEUTROABS 3.9 12.7*  --   HGB 14.2 12.6 11.1*  HCT 43.9 39.2 34.4*  MCV 82.2 84.5 84.9  PLT 191.0 158 127*    Basic Metabolic Panel: Recent Labs  Lab 03/05/21 1628 03/07/21 0910 03/08/21 0427  NA 143 137 138  K 3.8 3.7 3.3*  CL 102 101 105  CO2 31 27 25   GLUCOSE 90 134* 123*  BUN 13 13 11   CREATININE 0.73 0.75 0.75  CALCIUM 9.3 8.3* 8.2*    GFR: Estimated Creatinine Clearance: 64.4 mL/min (by C-G formula based on SCr of 0.75 mg/dL).  Liver Function Tests: Recent Labs  Lab 03/07/21 0910 03/08/21 0427  AST 22 14*  ALT 15 13  ALKPHOS 90 78  BILITOT 1.6* 1.5*  PROT 6.1* 5.2*  ALBUMIN 3.2* 2.7*    CBG: Recent Labs  Lab 03/07/21 1013  GLUCAP 124*     Recent Results (from the past 240 hour(s))  Resp Panel by RT-PCR (Flu A&B, Covid) Nasopharyngeal Swab     Status: None   Collection Time: 03/07/21 11:05 AM   Specimen: Nasopharyngeal Swab; Nasopharyngeal(NP) swabs in vial transport medium  Result Value Ref Range Status   SARS Coronavirus 2 by RT PCR NEGATIVE NEGATIVE Final     Comment: (NOTE) SARS-CoV-2 target nucleic acids are NOT DETECTED.  The SARS-CoV-2 RNA is generally detectable in upper respiratory specimens during the acute phase of infection. The lowest concentration of SARS-CoV-2 viral copies this assay can detect is 138 copies/mL. A negative result does not preclude SARS-Cov-2 infection and should not be used as the sole basis for treatment or other patient management decisions. A negative result may occur with  improper specimen collection/handling, submission of specimen other than nasopharyngeal swab, presence of viral mutation(s) within the areas targeted by this assay, and inadequate number of viral copies(<138 copies/mL). A negative result must  be combined with clinical observations, patient history, and epidemiological information. The expected result is Negative.  Fact Sheet for Patients:  EntrepreneurPulse.com.au  Fact Sheet for Healthcare Providers:  IncredibleEmployment.be  This test is no t yet approved or cleared by the [place] FDA and  has been authorized for detection and/or diagnosis of SARS-CoV-2 by FDA under an Emergency Use Authorization (EUA). This EUA will remain  in effect (meaning this test can be used) for the duration of the COVID-19 declaration under Section 564(b)(1) of the Act, 21 U.S.C.section 360bbb-3(b)(1), unless the authorization is terminated  or revoked sooner.       Influenza A by PCR NEGATIVE NEGATIVE Final   Influenza B by PCR NEGATIVE NEGATIVE Final    Comment: (NOTE) The Xpert Xpress SARS-CoV-2/FLU/RSV plus assay is intended as an aid in the diagnosis of influenza from Nasopharyngeal swab specimens and should not be used as a sole basis for treatment. Nasal washings and aspirates are unacceptable for Xpert Xpress SARS-CoV-2/FLU/RSV testing.  Fact Sheet for Patients: EntrepreneurPulse.com.au  Fact Sheet for Healthcare  Providers: IncredibleEmployment.be  This test is not yet approved or cleared by the [place] FDA and has been authorized for detection and/or diagnosis of SARS-CoV-2 by FDA under an Emergency Use Authorization (EUA). This EUA will remain in effect (meaning this test can be used) for the duration of the COVID-19 declaration under Section 564(b)(1) of the Act, 21 U.S.C. section 360bbb-3(b)(1), unless the authorization is terminated or revoked.  Performed at [facility], [place] [address] [place], [place] [address]          Radiology Studies: CT HEAD WO CONTRAST (5MM)  Result Date: 03/07/2021 CLINICAL DATA:  Head trauma, mod-severe EXAM: CT HEAD WITHOUT CONTRAST TECHNIQUE: Contiguous axial images were obtained from the base of the skull through the vertex without intravenous contrast. COMPARISON:  None. FINDINGS: Brain: There is no acute intracranial hemorrhage, mass effect, or edema. Gray-white differentiation is preserved. There is no extra-axial fluid collection. Ventricles and sulci are within normal limits in size and configuration. Patchy low-attenuation in the supratentorial white matter is nonspecific but may reflect chronic microvascular ischemic changes. Vascular: There is atherosclerotic calcification at the skull base. Skull: Calvarium is unremarkable. Sinuses/Orbits: No acute finding. Other: None. IMPRESSION: No evidence of acute intracranial injury. Electronically Signed   By: Macy Mis M.D.   On: 03/07/2021 11:09   CT Cervical Spine Wo Contrast  Result Date: 03/07/2021 CLINICAL DATA:  Neck trauma, mechanically unstable spine (Age >= 16y) EXAM: CT CERVICAL SPINE WITHOUT CONTRAST TECHNIQUE: Multidetector CT imaging of the cervical spine was performed without intravenous contrast. Multiplanar CT image reconstructions were also generated. COMPARISON:  None. FINDINGS: Alignment: No significant listhesis. Skull base and vertebrae: Postoperative  changes of ACDF at C5-C6. Vertebral body heights are maintained. No acute fracture. Soft tissues and spinal canal: No prevertebral fluid or swelling. No visible canal hematoma. Disc levels:  Mild degenerative changes are present. Upper chest: No apical lung mass. Other: Mild calcified plaque at the common carotid bifurcations. IMPRESSION: No acute cervical spine fracture. Electronically Signed   By: Macy Mis M.D.   On: 03/07/2021 11:13   CT CHEST ABDOMEN PELVIS W CONTRAST  Result Date: 03/07/2021 CLINICAL DATA:  Fall yesterday EXAM: CT CHEST, ABDOMEN, AND PELVIS WITH CONTRAST TECHNIQUE: Multidetector CT imaging of the chest, abdomen and pelvis was performed following the standard protocol during bolus administration of intravenous contrast. CONTRAST:  40mL OMNIPAQUE IOHEXOL 300 MG/ML  SOLN COMPARISON:  CT chest  angiogram, 11/16/2020, CT abdomen pelvis 12/01/2015 FINDINGS: CT CHEST FINDINGS Cardiovascular: Aortic atherosclerosis normal heart size. No pericardial effusion. Mediastinum/Nodes: No enlarged mediastinal, hilar, or axillary lymph nodes. Thyroid gland, trachea, and esophagus demonstrate no significant findings. Lungs/Pleura: Minimal centrilobular and paraseptal. Dependent bibasilar atelectasis and/or consolidation, with underlying elements of bandlike scarring and architectural distortion. No pleural effusion or pneumothorax. Musculoskeletal: No chest wall mass or suspicious bone lesions identified. CT ABDOMEN PELVIS FINDINGS Hepatobiliary: No solid liver abnormality is seen. Hepatic steatosis. No gallstones, gallbladder wall thickening, or biliary dilatation. Pancreas: Unremarkable. No pancreatic ductal dilatation or surrounding inflammatory changes. Spleen: Normal in size without significant abnormality. Adrenals/Urinary Tract: Adrenal glands are unremarkable. Kidneys are normal, without renal calculi, solid lesion, or hydronephrosis. Bladder is unremarkable. Stomach/Bowel: Stomach is within  normal limits. Appendix appears normal. No evidence of bowel wall thickening, distention, or inflammatory changes. Sigmoid diverticula. Vascular/Lymphatic: Aortic atherosclerosis. No enlarged abdominal or pelvic lymph nodes. Reproductive: No mass or other abnormality. Other: No abdominal wall hernia or abnormality. No abdominopelvic ascites. Musculoskeletal: No acute or significant osseous findings. IMPRESSION: 1. No CT evidence of acute traumatic injury to the chest, abdomen, or pelvis. 2. Dependent bibasilar atelectasis and/or consolidation, with underlying elements of bandlike scarring and architectural distortion, findings concerning for infection or aspiration superimposed on chronic scarring. Aortic Atherosclerosis (ICD10-I70.0). Electronically Signed   By: Delanna Ahmadi M.D.   On: 03/07/2021 11:14   DG Chest Port 1 View  Result Date: 03/07/2021 CLINICAL DATA:  Reason for exam: questionable sepsis. Pt has productive cough and congestion cough, congestion, question sepsis EXAM: PORTABLE CHEST - 1 VIEW COMPARISON:  03/05/2021 FINDINGS: Low lung volumes. Crowding of bronchovascular structures at the lung bases. No confluent airspace disease. Heart size upper limits normal. Aortic Atherosclerosis (ICD10-170.0). No definite effusion.  No pneumothorax. Left shoulder DJD. IMPRESSION: Low volumes.  No acute disease. Electronically Signed   By: Lucrezia Europe M.D.   On: 03/07/2021 10:13        Scheduled Meds:  apixaban  5 mg Oral BID   aspirin EC  81 mg Oral QHS   DULoxetine  60 mg Oral Daily   flecainide  50 mg Oral Q12H   fluticasone furoate-vilanterol  1 puff Inhalation Daily   And   umeclidinium bromide  1 puff Inhalation Daily   furosemide  20 mg Intravenous Daily   loratadine  10 mg Oral Daily   montelukast  10 mg Oral QHS   pravastatin  40 mg Oral QHS   sodium chloride flush  3 mL Intravenous Q12H   Continuous Infusions:  ceFEPime (MAXIPIME) IV Stopped (03/08/21 0951)   vancomycin 200  mL/hr at 03/08/21 1037     LOS: 1 day       Hosie Poisson, MD [facility]   To contact the attending provider between 7A-7P or the covering provider during after hours 7P-7A, please log into the web site [email or web address] and access using universal [place] password for that web site. If you do not have the password, please call the hospital operator.  03/08/2021, 12:19 PM

## 2021-03-08 NOTE — TOC Initial Note (Signed)
Transition of Care [facility]) - Initial/Assessment Note    Patient Details  Name: [name] Molina MRN: [id number] Date of Birth: 1945/06/26  Transition of [facility]) CM/SW Contact:    Tom-Johnson, Renea Ee, RN Phone Number: 03/08/2021, 4:49 PM  Clinical Narrative:                 CM spoke with patient at bedside about TOC needs for post hospital transition. Patient states she recently had Covid and has been having respiratory issues since. Patient states she fell into her recliner and hurt the right side of her chest and has been having confusion. States she lives with her daughter. Has three children and five grand children. Retired from [employer]. Does not drive and daughter transports to and from appointments. Has a cane, walker, wheelchair, bedside commode and shower chair with back at home. Has a PCP and medically insured by [place] and has straight Medicare. Uses home Oxygen and get supplies from [place]. Denies any TOC needs at this time. Medical workup continues. CM will continue to follow with needs.    Barriers to Discharge: Continued Medical Work up   Patient Goals and CMS Choice Patient states their goals for this hospitalization and ongoing recovery are:: To go home CMS Medicare.gov Compare Post Acute Care list provided to:: Patient    Expected Discharge Plan and Services     Discharge Planning Services: CM Consult   Living arrangements for the past 2 months: Single Family Home                                      Prior Living Arrangements/Services Living arrangements for the past 2 months: Single Family Home Lives with:: Adult Children (Daughter) Patient language and need for interpreter reviewed:: Yes Do you feel safe going back to the place where you live?: Yes      Need for Family Participation in Patient Care: Yes (Comment) Care giver support system in place?: Yes (comment)   Criminal  Activity/Legal Involvement Pertinent to Current Situation/Hospitalization: No - Comment as needed  Activities of Daily Living Home Assistive Devices/Equipment: Environmental consultant (specify type), Shower chair with back (rollator) ADL Screening (condition at time of admission) Patient's cognitive ability adequate to safely complete daily activities?: Yes Is the patient deaf or have difficulty hearing?: No Does the patient have difficulty seeing, even when wearing glasses/contacts?: No Does the patient have difficulty concentrating, remembering, or making decisions?: No Patient able to express need for assistance with ADLs?: No Does the patient have difficulty dressing or bathing?: No Independently performs ADLs?: Yes (appropriate for developmental age) Does the patient have difficulty walking or climbing stairs?: Yes Weakness of Legs: Both Weakness of Arms/Hands: None  Permission Sought/Granted Permission sought to share information with : Case Manager, Family Supports Permission granted to share information with : Yes, Verbal Permission Granted              Emotional Assessment Appearance:: Appears stated age Attitude/Demeanor/Rapport: Engaged Affect (typically observed): Accepting, Appropriate, Calm, Hopeful Orientation: : Oriented to Self, Oriented to Place, Oriented to  Time, Oriented to Situation Alcohol / Substance Use: Not Applicable Psych Involvement: No (comment)  Admission diagnosis:  Trauma [I33.82NK] Toxic metabolic encephalopathy [N39.7] Sepsis due to pneumonia ([place]) [J18.9, A41.9] Patient Active Problem List   Diagnosis Date Noted   Sepsis due to pneumonia ([place]) 03/07/2021  Acute metabolic encephalopathy 63/84/5364   Transient hypotension 03/07/2021   DNR (do not resuscitate) 03/07/2021   Abnormal CT scan, chest 03/05/2021   Anxiety    Hypokalemia    Chronic respiratory failure with hypoxia ([place]) 09/29/2020   Paroxysmal atrial fibrillation ([place]) 08/13/2020   Major  depressive disorder 08/13/2020   Chronic diastolic CHF (congestive heart failure) ([place]) 08/13/2020   Elevated troponin level not due myocardial infarction 08/13/2020   Atrial fibrillation with RVR ([place]) 09/18/2019   Acute on chronic respiratory failure with hypoxia ([place]) 09/13/2019   Edema of both lower extremities 05/07/2018   Pulmonary nodule 12/01/2017   Dyspnea 11/26/2017   RUQ abdominal pain 11/27/2015   Suprapubic pain 11/27/2015   Dysphagia 11/27/2015   Abdominal mass, RUQ (right upper quadrant) 11/27/2015   Obstructive sleep apnea 04/06/2015   PAT (paroxysmal atrial tachycardia) ([place]) 08/09/2014   GERD without esophagitis    Mixed hyperlipidemia    Spinal stenosis of lumbar region 03/16/2014   Idiopathic peripheral neuropathy 03/16/2014   Hypertension 06/13/2011   Allergic rhinitis 06/13/2011   Chronic headaches 06/13/2011   COPD (chronic obstructive pulmonary disease) ([place]) 06/13/2011   PCP:  Harlan Stains, MD Pharmacy:   [facility] - [place], [place] - [address] [place] [address] Phone: [phone] Fax: [phone]  [id number] - [place], [place] [address] Phone: [phone] Fax: [phone]     Social Determinants of Health (SDOH) Interventions    Readmission Risk Interventions No flowsheet data found.

## 2021-03-08 NOTE — Plan of Care (Signed)
  Problem: Activity: Goal: Ability to tolerate increased activity will improve Outcome: Progressing   Problem: Clinical Measurements: Goal: Ability to maintain a body temperature in the normal range will improve Outcome: Progressing   Problem: Respiratory: Goal: Ability to maintain adequate ventilation will improve Outcome: Progressing   

## 2021-03-08 NOTE — Plan of Care (Signed)
  Problem: Activity: Goal: Ability to tolerate increased activity will improve Outcome: Progressing   Problem: Health Behavior/Discharge Planning: Goal: Ability to manage health-related needs will improve Outcome: Progressing   Problem: Coping: Goal: Level of anxiety will decrease Outcome: Progressing   Problem: Pain Managment: Goal: General experience of comfort will improve Outcome: Progressing

## 2021-03-09 ENCOUNTER — Inpatient Hospital Stay (HOSPITAL_COMMUNITY): Payer: Medicare Other

## 2021-03-09 LAB — BASIC METABOLIC PANEL
Anion gap: 7 (ref 5–15)
BUN: 7 mg/dL — ABNORMAL LOW (ref 8–23)
CO2: 25 mmol/L (ref 22–32)
Calcium: 8.3 mg/dL — ABNORMAL LOW (ref 8.9–10.3)
Chloride: 106 mmol/L (ref 98–111)
Creatinine, Ser: 0.63 mg/dL (ref 0.44–1.00)
GFR, Estimated: >60 mL/min (ref >60)
Glucose, Bld: 120 mg/dL — ABNORMAL HIGH (ref 70–99)
Potassium: 3.4 mmol/L — ABNORMAL LOW (ref 3.5–5.1)
Sodium: 138 mmol/L (ref 135–145)

## 2021-03-09 LAB — CBC WITH DIFFERENTIAL/PLATELET
Abs Immature Granulocytes: 0.05 10*3/uL (ref 0.00–0.07)
Basophils Absolute: 0 10*3/uL (ref 0.0–0.1)
Basophils Relative: 0 %
Eosinophils Absolute: 0.1 10*3/uL (ref 0.0–0.5)
Eosinophils Relative: 2 %
HCT: 36 % (ref 36.0–46.0)
Hemoglobin: 11.4 g/dL — ABNORMAL LOW (ref 12.0–15.0)
Immature Granulocytes: 1 %
Lymphocytes Relative: 9 %
Lymphs Abs: 0.6 10*3/uL — ABNORMAL LOW (ref 0.7–4.0)
MCH: 26.8 pg (ref 26.0–34.0)
MCHC: 31.7 g/dL (ref 30.0–36.0)
MCV: 84.7 fL (ref 80.0–100.0)
Monocytes Absolute: 0.8 10*3/uL (ref 0.1–1.0)
Monocytes Relative: 11 %
Neutro Abs: 5.5 10*3/uL (ref 1.7–7.7)
Neutrophils Relative %: 77 %
Platelets: 135 10*3/uL — ABNORMAL LOW (ref 150–400)
RBC: 4.25 MIL/uL (ref 3.87–5.11)
RDW: 15.9 % — ABNORMAL HIGH (ref 11.5–15.5)
WBC: 7.1 10*3/uL (ref 4.0–10.5)
nRBC: 0 % (ref 0.0–0.2)

## 2021-03-09 MED ORDER — POTASSIUM CHLORIDE CRYS ER 20 MEQ PO TBCR
40.0000 meq | EXTENDED_RELEASE_TABLET | Freq: Once | ORAL | Status: AC
  Administered 2021-03-09: 40 meq via ORAL
  Filled 2021-03-09: qty 2

## 2021-03-09 MED ORDER — TRAMADOL HCL 50 MG PO TABS
50.0000 mg | ORAL_TABLET | Freq: Four times a day (QID) | ORAL | Status: DC | PRN
Start: 2021-03-09 — End: 2021-03-15
  Administered 2021-03-09 – 2021-03-14 (×7): 50 mg via ORAL
  Filled 2021-03-09 (×7): qty 1

## 2021-03-09 MED ORDER — MORPHINE SULFATE (PF) 2 MG/ML IV SOLN: 1.0000 mg | INTRAVENOUS | Status: DC | PRN

## 2021-03-09 NOTE — Evaluation (Signed)
Occupational Therapy Evaluation Patient Details Name: [name] Molina MRN: [id number] DOB: September 06, 1945 Today's Date: 03/09/2021   History of Present Illness 75 y.o. female presents to [facility] hospital on 03/07/2021 with confusion, found to have PNA. PMH includes COPD with asthma, chronic respiratory failure, diastolic congestive heart failure, paroxysmal atrial fibrillation on chronic anticoagulation, and recent COVID-19 infection.   Clinical Impression   Patient admitted for the above diagnosis.  Main deficit is R flank/rib pain.  X-ray completed and results have not been read as of this eval.  Patient positioned for comfort, but declines out of bed for today.  PTA she lives with her daughter, who works during the day.  She is generally a household Ambulator with RW, independent with ADL and light meal prep/home management, but daughter completes the bulk of IADL and community mobility.  Deficits impacting independence are listed below.  Currently she is having difficulty progressing out of bed due to R rib pain. OT will continue to see her in the acute setting to maximize her functional abilities.  Patient admits she will have to be Mod I for mobility to return home safely.  She is hoping to progress for a return home with [facility], but SNF may need to be considered based on progress.        Recommendations for follow up therapy are one component of a multi-disciplinary discharge planning process, led by the attending physician.  Recommendations may be updated based on patient status, additional functional criteria and insurance authorization.   Follow Up Recommendations  Home health OT based on progress.  SNF may need to be considered.     Equipment Recommendations  None recommended by OT    Recommendations for Other Services       Precautions / Restrictions Precautions Precautions: Fall Precaution Comments: monitor SpO2 Restrictions Weight Bearing Restrictions: No      Mobility Bed Mobility    Bed Mobility: Rolling Rolling: Mod assist         General bed mobility comments: due to pain Patient Response: Anxious  Transfers                      Balance                                           ADL either performed or assessed with clinical judgement   ADL       Grooming: Wash/dry hands;Wash/dry face;Set up;Bed level   Upper Body Bathing: Moderate assistance;Bed level       Upper Body Dressing : Moderate assistance;Bed level                     General ADL Comments: deferred OOB due to rib pain, x-ray taken, and results not read as of this eval.     Vision Patient Visual Report: No change from baseline       Perception     Praxis      Pertinent Vitals/Pain Faces Pain Scale: Hurts whole lot Pain Location: R ribs/chest Pain Descriptors / Indicators: Grimacing;Guarding;Sharp Pain Intervention(s): Limited activity within patient's tolerance     Hand Dominance Right   Extremity/Trunk Assessment Upper Extremity Assessment Upper Extremity Assessment: Overall WFL for tasks assessed   Lower Extremity Assessment Lower Extremity Assessment: Defer to PT evaluation (Able to move arms against gravity, deferred MMT due to rib pain.)  Cervical / Trunk Assessment Cervical / Trunk Assessment: Normal   Communication Communication Communication: No difficulties   Cognition Arousal/Alertness: Awake/alert Behavior During Therapy: WFL for tasks assessed/performed Overall Cognitive Status: Within Functional Limits for tasks assessed                                     General Comments       Exercises     Shoulder Instructions      Home Living Family/patient expects to be discharged to:: Private residence Living Arrangements: Children Available Help at Discharge: Family;Available PRN/intermittently Type of Home: House Home Access: Stairs to enter [employer] of Steps: 1 Entrance  Stairs-Rails: None Home Layout: Able to live on main level with bedroom/bathroom     Bathroom Shower/Tub: Occupational psychologist: Standard Bathroom Accessibility: Yes How Accessible: Accessible via walker Home Equipment: Cane - single point;Walker - 4 wheels;Shower seat;Bedside commode;Grab bars - tub/shower;Transport chair          Prior Functioning/Environment    Gait / Transfers Assistance Needed: household ambulator with Rollator per PT ADL's / Homemaking Assistance Needed: assist for IADL's, independent ADL's; lives with daughter who can assist, but daughter works during the day.  Can complete light meal prep and light home management RW level.  Does not drive, and cares for own Meds.            OT Problem List: Decreased strength;Decreased activity tolerance;Impaired balance (sitting and/or standing);Pain      OT Treatment/Interventions: Self-care/ADL training;Therapeutic exercise;Therapeutic activities;Balance training;DME and/or AE instruction;Energy conservation    OT Goals(Current goals can be found in the care plan section) Acute Rehab OT Goals Patient Stated Goal: I hope I can get better OT Goal Formulation: With patient Time For Goal Achievement: 03/22/21 Potential to Achieve Goals: Good ADL Goals Pt Will Perform Grooming: with supervision;standing Pt Will Perform Upper Body Bathing: with set-up;sitting Pt Will Perform Upper Body Dressing: with set-up;sitting Pt Will Transfer to Toilet: with supervision;ambulating;regular height toilet Pt Will Perform Toileting - Clothing Manipulation and hygiene: with set-up;sitting/lateral leans  OT Frequency: Min 2X/week   Barriers to D/C: Decreased caregiver support          Co-evaluation              AM-PAC OT "6 Clicks" Daily Activity     Outcome Measure Help from another person eating meals?: None Help from another person taking care of personal grooming?: None Help from another person toileting,  which includes using toliet, bedpan, or urinal?: A Lot Help from another person bathing (including washing, rinsing, drying)?: A Lot Help from another person to put on and taking off regular upper body clothing?: A Lot Help from another person to put on and taking off regular lower body clothing?: A Lot 6 Click Score: 16   End of Session Nurse Communication: Mobility status  Activity Tolerance: Patient limited by pain Patient left: in bed;with call bell/phone within reach  OT Visit Diagnosis: Pain                Time: 1355-1414 OT Time Calculation (min): 19 min Charges:  OT General Charges $OT Visit: 1 Visit OT Evaluation $OT Eval Moderate Complexity: 1 Mod  03/09/2021  RP, OTR/L  Acute Rehabilitation Services  Office:  [phone]   Metta Clines 03/09/2021, 2:20 PM

## 2021-03-09 NOTE — Plan of Care (Signed)
  Problem: Activity: Goal: Ability to tolerate increased activity will improve Outcome: Progressing   Problem: Respiratory: Goal: Ability to maintain adequate ventilation will improve Outcome: Progressing   Problem: Coping: Goal: Level of anxiety will decrease Outcome: Progressing   

## 2021-03-09 NOTE — Evaluation (Signed)
Physical Therapy Evaluation Patient Details Name: QUANDRA FEDORCHAK MRN: [id number] DOB: November 06, 1945 Today's Date: 03/09/2021  History of Present Illness  75 y.o. female presents to [facility] hospital on 03/07/2021 with confusion, found to have PNA. PMH includes COPD with asthma, chronic respiratory failure, diastolic congestive heart failure, paroxysmal atrial fibrillation on chronic anticoagulation, and recent COVID-19 infection.  Clinical Impression  Pt presents to PT with deficits in functional mobility, gait, balance, strength, power, endurance, activity tolerance. Pt fatigues quickly during session and benefits from PT assistance with transfers to reduce falls risk. Pt is unable to identify caregiver support during the day as the pt's daughter works. To return home the pt will need to be able to transfer at a modI level and either ambulate short household distances at a modI level or mobilize in manual wheelchair/transport chair during the day while daughter is at work. PT recommends Snf placement at this time as the pt fatigues rapidly and remains at a high risk for falls.     Recommendations for follow up therapy are one component of a multi-disciplinary discharge planning process, led by the attending physician.  Recommendations may be updated based on patient status, additional functional criteria and insurance authorization.  Follow Up Recommendations SNF (may progress if mobilized aggressively)    Equipment Recommendations  None recommended by PT    Recommendations for Other Services       Precautions / Restrictions Precautions Precautions: Fall Precaution Comments: monitor SpO2 Restrictions Weight Bearing Restrictions: No      Mobility  Bed Mobility Overal bed mobility: Needs Assistance Bed Mobility: Supine to Sit     Supine to sit: Min guard;HOB elevated     General bed mobility comments: increased time    Transfers Overall transfer level: Needs assistance Equipment used:  1 person hand held assist Transfers: Sit to/from [facility] Sit to Stand: Min assist Stand pivot transfers: Min assist       General transfer comment: hand hold assist to steady  Ambulation/Gait Ambulation/Gait assistance:  (pt declines due to fatigue)              Stairs            Wheelchair Mobility    Modified Rankin (Stroke Patients Only)       Balance Overall balance assessment: Needs assistance Sitting-balance support: No upper extremity supported;Feet supported Sitting balance-Leahy Scale: Fair     Standing balance support: Single extremity supported Standing balance-Leahy Scale: Poor Standing balance comment: benefits from UE support of hand hold                             Pertinent Vitals/Pain Pain Assessment: Faces Faces Pain Scale: Hurts even more Pain Location: R side of thorax Pain Descriptors / Indicators: Aching Pain Intervention(s): Monitored during session    Home Living Family/patient expects to be discharged to:: Private residence Living Arrangements: Children Available Help at Discharge: Family;Available PRN/intermittently (daughter works during the day, only available at night, no other caregivers identified) Type of Home: House Home Access: Stairs to enter Entrance Stairs-Rails: None Entrance Stairs-Number of Steps: 1 Home Layout: Able to live on main level with bedroom/bathroom Home Equipment: [place] - single point;Walker - 4 wheels;Shower seat;Bedside commode;Grab bars - tub/shower;Transport chair      Prior Function Level of Independence: Needs assistance   Gait / Transfers Assistance Needed: household ambulator with Rollator  ADL's / Homemaking Assistance Needed: assist for IADL's, independent ADL's;  lives with daughter who can assist, but daughter works during the day.        Hand Dominance   Dominant Hand: Right    Extremity/Trunk Assessment   Upper Extremity Assessment Upper  Extremity Assessment: Overall WFL for tasks assessed    Lower Extremity Assessment Lower Extremity Assessment: Generalized weakness    Cervical / Trunk Assessment Cervical / Trunk Assessment: Normal  Communication   Communication: No difficulties  Cognition Arousal/Alertness: Awake/alert Behavior During Therapy: WFL for tasks assessed/performed Overall Cognitive Status: Within Functional Limits for tasks assessed                                        General Comments General comments (skin integrity, edema, etc.): pt on 2L [place] at baseline for mobility. At rest on room air pt sats at 90%. Sats 94-95% on 2L [place] during session. Tachycardia to 130s with activity.    Exercises     Assessment/Plan    PT Assessment Patient needs continued PT services  PT Problem List Decreased strength;Decreased activity tolerance;Decreased balance;Decreased mobility;Cardiopulmonary status limiting activity;Pain       PT Treatment Interventions DME instruction;Gait training;Stair training;Functional mobility training;Therapeutic activities;Therapeutic exercise;Balance training;Neuromuscular re-education;Patient/family education    PT Goals (Current goals can be found in the Care Plan section)  Acute Rehab PT Goals Patient Stated Goal: to return to independent mobility PT Goal Formulation: With patient Time For Goal Achievement: 03/23/21 Potential to Achieve Goals: Good    Frequency Min 3X/week (maintain 3x in an effort to progress to discharge home)   Barriers to discharge        Co-evaluation               AM-PAC PT "6 Clicks" Mobility  Outcome Measure Help needed turning from your back to your side while in a flat bed without using bedrails?: A Little Help needed moving from lying on your back to sitting on the side of a flat bed without using bedrails?: A Little Help needed moving to and from a bed to a chair (including a wheelchair)?: A Little Help needed standing  up from a chair using your arms (e.g., wheelchair or bedside chair)?: A Little Help needed to walk in hospital room?: A Lot Help needed climbing 3-5 steps with a railing? : A Lot 6 Click Score: 16    End of Session Equipment Utilized During Treatment: Oxygen Activity Tolerance: Patient limited by fatigue Patient left: in chair;with call bell/phone within reach;with chair alarm set Nurse Communication: Mobility status PT Visit Diagnosis: Other abnormalities of gait and mobility (R26.89);Muscle weakness (generalized) (M62.81);Pain Pain - Right/Left: Right Pain - part of body:  (thorax)    Time: 9458-5929 PT Time Calculation (min) (ACUTE ONLY): 28 min   Charges:   PT Evaluation $PT Eval Low Complexity: Wagon[place], PT, DPT Acute Rehabilitation Pager: [phone] Office [phone]   Zenaida Niece 03/09/2021, 10:47 AM

## 2021-03-09 NOTE — Progress Notes (Signed)
PROGRESS NOTE    [name] Molina  HYI:[id number] DOB: [birth year] DOA: 03/07/2021 PCP: Harlan Stains, MD    Chief Complaint  Patient presents with   Altered Mental Status   Fall    Brief Narrative:    [name] Molina is a 75 y.o. female with medical history significant of COPD with asthma, chronic respiratory failure, diastolic congestive heart failure, paroxysmal atrial fibrillation on chronic anticoagulation, and recent COVID-19 infection who presents with increasing confusion. She had been evaluated by her PCP for the symptoms and had taken 8 days of a 10-day course of doxycycline and then subsequently prescribed a 10-day course of Ceftin without any significant improvement in symptoms.    CT scan at the head, cervical spine, chest, abdomen, and pelvis was obtained noting dependent bibasilar atelectasis and/or consolidation with elements concerning for infection or aspiration superimposed on chronic scarring.  She was started on IV antibiotics and admitted.  She reports persistent coughing and right sided rib pain.    Assessment & Plan:   Principal Problem:   Sepsis due to pneumonia [facility]) Active Problems:   COPD (chronic obstructive pulmonary disease) (HCC)   Mixed hyperlipidemia   Acute on chronic respiratory failure with hypoxia (HCC)   Paroxysmal atrial fibrillation (HCC)   Chronic diastolic CHF (congestive heart failure) (HCC)   Anxiety   Acute metabolic encephalopathy   Transient hypotension   DNR (do not resuscitate)   Acute on chronic respiratory failure currently requiring up to 3 lit of [place] oxygen.  -probably secondary to CAP.  - she failed 2 courses of antibiotics as outpatient.  - on admission she was febrile, tachycardic, tachypnea, elevated wbc and elevated lactic acid.  - started on IV antibiotics, continue with duonebs.  - follow up culture reports.  - [place] oxygen to keep sats greater than 90%.  Urine strep and legionella is negative.  Sputum cultures are  pending.  - reports right sided pain, will get rib series.  - therapy eval ordered, recommending SNF.  - TOC made aware.  - pt still very dyspneic and in pain , recommend another 24 to 48 hours of IV antibiotics .     Acute metabolic encephalopathy probably from acute respiratory failure with hypoxia:  - resolved.  - she is alert and oriented,    COPD:  No wheezing heard on exam.  Continue with trelegy, ellipta, singular .    CAD: Continue with aspirin and statin.    Paroxysmal atrial fibrillation  - rate elevated probably from increased work of breathing.  - will add metoprolol for rate control.  Continue with flecainide .  Continue with eliquis for anti coagulation.    Chronic Diastolic heart failure:  - crackles on exam.  - another dose of lasix ordered today.  Continue with strict intake and output.    Anxiety and depression:  Continue with home meds.   Hypokalemia:  Replaced. Repeat in am.     DVT prophylaxis: eliquis. Code Status: DNR. Family Communication: none at bedside. Discussed with daughter over the phone.  Disposition:   Status is: Inpatient  Remains inpatient appropriate because: requiring IV antibiotics.        Consultants:  None.   Procedures: None.  Antimicrobials:  Antibiotics Given (last 72 hours)     Date/Time Action Medication Dose Rate   03/07/21 0957 New Bag/Given   ceFEPIme (MAXIPIME) 2 g in sodium chloride 0.9 % 100 mL IVPB 2 g 200 mL/hr   03/07/21 1111 New Bag/Given  vancomycin (VANCOREADY) IVPB 2000 mg/400 mL 2,000 mg 200 mL/hr   03/07/21 1844 New Bag/Given   ceFEPIme (MAXIPIME) 2 g in sodium chloride 0.9 % 100 mL IVPB 2 g 200 mL/hr   03/08/21 0221 New Bag/Given   ceFEPIme (MAXIPIME) 2 g in sodium chloride 0.9 % 100 mL IVPB 2 g 200 mL/hr   03/08/21 0920 New Bag/Given   ceFEPIme (MAXIPIME) 2 g in sodium chloride 0.9 % 100 mL IVPB 2 g 200 mL/hr   03/08/21 1034 New Bag/Given   vancomycin (VANCOCIN) IVPB 1000 mg/200  mL premix 1,000 mg 200 mL/hr   03/08/21 1741 New Bag/Given   ceFEPIme (MAXIPIME) 2 g in sodium chloride 0.9 % 100 mL IVPB 2 g 200 mL/hr   03/09/21 0212 New Bag/Given   ceFEPIme (MAXIPIME) 2 g in sodium chloride 0.9 % 100 mL IVPB 2 g 200 mL/hr   03/09/21 1054 New Bag/Given   ceFEPIme (MAXIPIME) 2 g in sodium chloride 0.9 % 100 mL IVPB 2 g 200 mL/hr   03/09/21 1210 New Bag/Given   vancomycin (VANCOCIN) IVPB 1000 mg/200 mL premix 1,000 mg 200 mL/hr         Subjective: Very dyspneic, coughing and pain int he lateral ribs on the right.   Objective: Vitals:   03/09/21 0105 03/09/21 0452 03/09/21 0905 03/09/21 0906  BP: 126/76 (!) 139/57 (!) 142/71   Pulse: 100 (!) 112 (!) 119   Resp: 20 19 19    Temp:  98 F (36.7 C) 98.2 F (36.8 C)   TempSrc:  Oral    SpO2: 96% 96% 98% 98%  Weight:      Height:        Intake/Output Summary (Last 24 hours) at 03/09/2021 1613 Last data filed at 03/09/2021 1226 Gross per 24 hour  Intake 730.34 ml  Output 1100 ml  Net -369.66 ml    Filed Weights   03/07/21 0857  Weight: 95.3 kg    Examination:  General exam: Elderly lady in mild discomfort from lateral rib pain Respiratory system: tachypnea , diminished air entry at bases, on 3 L of nasal cannula oxygen Cardiovascular system: S1 & S2 heard, irregularly irregular, tachycardic, no JVD Gastrointestinal system: Abdomen is nondistended, soft and nontender. . Normal bowel sounds heard. Central nervous system: Alert and oriented. No focal neurological deficits. Extremities: Symmetric 5 x 5 power. Skin: No rashes, lesions or ulcers Psychiatry:  Mood & affect appropriate.      Data Reviewed: I have personally reviewed following labs and imaging studies  CBC: Recent Labs  Lab 03/05/21 1628 03/07/21 0910 03/08/21 0427 03/09/21 0408  WBC 5.7 15.1* 7.0 7.1  NEUTROABS 3.9 12.7*  --  5.5  HGB 14.2 12.6 11.1* 11.4*  HCT 43.9 39.2 34.4* 36.0  MCV 82.2 84.5 84.9 84.7  PLT 191.0 158 127*  135*     Basic Metabolic Panel: Recent Labs  Lab 03/05/21 1628 03/07/21 0910 03/08/21 0427 03/09/21 0408  NA 143 137 138 138  K 3.8 3.7 3.3* 3.4*  CL 102 101 105 106  CO2 31 27 25 25   GLUCOSE 90 134* 123* 120*  BUN 13 13 11  7*  CREATININE 0.73 0.75 0.75 0.63  CALCIUM 9.3 8.3* 8.2* 8.3*     GFR: Estimated Creatinine Clearance: 64.4 mL/min (by C-G formula based on SCr of 0.63 mg/dL).  Liver Function Tests: Recent Labs  Lab 03/07/21 0910 03/08/21 0427  AST 22 14*  ALT 15 13  ALKPHOS 90 78  BILITOT 1.6* 1.5*  PROT 6.1* 5.2*  ALBUMIN 3.2* 2.7*     CBG: Recent Labs  Lab 03/07/21 1013  GLUCAP 124*      Recent Results (from the past 240 hour(s))  Blood Culture (routine x 2)     Status: None (Preliminary result)   Collection Time: 03/07/21  9:51 AM   Specimen: BLOOD  Result Value Ref Range Status   Specimen Description BLOOD SITE NOT SPECIFIED  Final   Special Requests   Final    BOTTLES DRAWN AEROBIC AND ANAEROBIC Blood Culture results may not be optimal due to an excessive volume of blood received in culture bottles   Culture   Final    NO GROWTH 2 DAYS Performed at [facility], [place] [address] [place], [place] [address]    Report Status PENDING  Incomplete  Blood Culture (routine x 2)     Status: None (Preliminary result)   Collection Time: 03/07/21  9:57 AM   Specimen: BLOOD  Result Value Ref Range Status   Specimen Description BLOOD SITE NOT SPECIFIED  Final   Special Requests   Final    BOTTLES DRAWN AEROBIC AND ANAEROBIC Blood Culture results may not be optimal due to an excessive volume of blood received in culture bottles   Culture   Final    NO GROWTH 2 DAYS Performed at [facility], [place] [address] [place], [place] [address]    Report Status PENDING  Incomplete  Resp Panel by RT-PCR (Flu A&B, Covid) Nasopharyngeal Swab     Status: None   Collection Time: 03/07/21 11:05 AM   Specimen: Nasopharyngeal Swab; Nasopharyngeal(NP)  swabs in vial transport medium  Result Value Ref Range Status   SARS Coronavirus 2 by RT PCR NEGATIVE NEGATIVE Final    Comment: (NOTE) SARS-CoV-2 target nucleic acids are NOT DETECTED.  The SARS-CoV-2 RNA is generally detectable in upper respiratory specimens during the acute phase of infection. The lowest concentration of SARS-CoV-2 viral copies this assay can detect is 138 copies/mL. A negative result does not preclude SARS-Cov-2 infection and should not be used as the sole basis for treatment or other patient management decisions. A negative result may occur with  improper specimen collection/handling, submission of specimen other than nasopharyngeal swab, presence of viral mutation(s) within the areas targeted by this assay, and inadequate number of viral copies(<138 copies/mL). A negative result must be combined with clinical observations, patient history, and epidemiological information. The expected result is Negative.  Fact Sheet for Patients:  EntrepreneurPulse.com.au  Fact Sheet for Healthcare Providers:  IncredibleEmployment.be  This test is no t yet approved or cleared by the [place] FDA and  has been authorized for detection and/or diagnosis of SARS-CoV-2 by FDA under an Emergency Use Authorization (EUA). This EUA will remain  in effect (meaning this test can be used) for the duration of the COVID-19 declaration under Section 564(b)(1) of the Act, 21 U.S.C.section 360bbb-3(b)(1), unless the authorization is terminated  or revoked sooner.       Influenza A by PCR NEGATIVE NEGATIVE Final   Influenza B by PCR NEGATIVE NEGATIVE Final    Comment: (NOTE) The Xpert Xpress SARS-CoV-2/FLU/RSV plus assay is intended as an aid in the diagnosis of influenza from Nasopharyngeal swab specimens and should not be used as a sole basis for treatment. Nasal washings and aspirates are unacceptable for Xpert Xpress  SARS-CoV-2/FLU/RSV testing.  Fact Sheet for Patients: EntrepreneurPulse.com.au  Fact Sheet for Healthcare Providers: IncredibleEmployment.be  This test is not yet  approved or cleared by the [place] and has been authorized for detection and/or diagnosis of SARS-CoV-2 by FDA under an Emergency Use Authorization (EUA). This EUA will remain in effect (meaning this test can be used) for the duration of the COVID-19 declaration under Section 564(b)(1) of the Act, 21 U.S.C. section 360bbb-3(b)(1), unless the authorization is terminated or revoked.  Performed at [facility], [place] [address], [place], [place] [address]   Expectorated Sputum Assessment w Gram Stain, Rflx to Resp Cult     Status: None (Preliminary result)   Collection Time: 03/08/21 10:25 AM   Specimen: Expectorated Sputum  Result Value Ref Range Status   Specimen Description EXPECTORATED SPUTUM  Final   Special Requests NONE  Final   Sputum evaluation   Final    THIS SPECIMEN IS ACCEPTABLE FOR SPUTUM CULTURE Performed at [facility], [place] [address], [place], [place] [address]    Report Status PENDING  Incomplete  Culture, Respiratory w Gram Stain     Status: None (Preliminary result)   Collection Time: 03/08/21 10:25 AM  Result Value Ref Range Status   Specimen Description EXPECTORATED SPUTUM  Final   Special Requests NONE Reflexed from [id number]  Final   Gram Stain   Final    FEW SQUAMOUS EPITHELIAL CELLS PRESENT MODERATE WBC SEEN NO ORGANISMS SEEN Performed at [facility], [place] [address], [place], [place] [address]    Culture PENDING  Incomplete   Report Status PENDING  Incomplete          Radiology Studies: DG Ribs Bilateral W/Chest  Result Date: 03/09/2021 CLINICAL DATA:  Bilateral anterior rib pain after falling a few days ago. Right greater than left rib pain. EXAM: BILATERAL RIBS AND CHEST - 4+ VIEW COMPARISON:  Chest radiographs  03/07/2021.  CT chest 03/07/2021. FINDINGS: No evidence of acute rib fracture or focal rib lesion. There are mild degenerative changes within the spine. Patient is status post lower cervical fusion. The heart size and mediastinal contours are stable with aortic atherosclerosis. There are small pleural effusions with mild atelectasis at both lung bases. No evidence of pneumothorax. Telemetry leads overlie the chest. IMPRESSION: No evidence of rib fracture, pleural effusion or pneumothorax. Stable bibasilar atelectasis. Electronically Signed   By: Richardean Sale M.D.   On: 03/09/2021 14:14        Scheduled Meds:  apixaban  5 mg Oral BID   aspirin EC  81 mg Oral QHS   DULoxetine  60 mg Oral Daily   flecainide  50 mg Oral Q12H   fluticasone furoate-vilanterol  1 puff Inhalation Daily   And   umeclidinium bromide  1 puff Inhalation Daily   furosemide  20 mg Intravenous Daily   loratadine  10 mg Oral Daily   montelukast  10 mg Oral QHS   pravastatin  40 mg Oral QHS   sodium chloride flush  3 mL Intravenous Q12H   Continuous Infusions:  ceFEPime (MAXIPIME) IV 2 g (03/09/21 1054)   vancomycin 1,000 mg (03/09/21 1210)     LOS: 2 days       Hosie Poisson, MD [facility]   To contact the attending provider between 7A-7P or the covering provider during after hours 7P-7A, please log into the web site [email or web address] and access using universal [place] password for that web site. If you do not have the password, please call the hospital operator.  03/09/2021, 4:13 PM

## 2021-03-10 LAB — BASIC METABOLIC PANEL
Anion gap: 10 (ref 5–15)
BUN: 7 mg/dL — ABNORMAL LOW (ref 8–23)
CO2: 25 mmol/L (ref 22–32)
Calcium: 8.3 mg/dL — ABNORMAL LOW (ref 8.9–10.3)
Chloride: 96 mmol/L — ABNORMAL LOW (ref 98–111)
Creatinine, Ser: 0.59 mg/dL (ref 0.44–1.00)
GFR, Estimated: >60 mL/min (ref >60)
Glucose, Bld: 165 mg/dL — ABNORMAL HIGH (ref 70–99)
Potassium: 3.7 mmol/L (ref 3.5–5.1)
Sodium: 131 mmol/L — ABNORMAL LOW (ref 135–145)

## 2021-03-10 LAB — EXPECTORATED SPUTUM ASSESSMENT W GRAM STAIN, RFLX TO RESP C

## 2021-03-10 MED ORDER — KETOROLAC TROMETHAMINE 15 MG/ML IJ SOLN
15.0000 mg | Freq: Once | INTRAMUSCULAR | Status: AC
  Administered 2021-03-10: 15 mg via INTRAVENOUS
  Filled 2021-03-10: qty 1

## 2021-03-10 MED ORDER — METHOCARBAMOL 500 MG PO TABS
500.0000 mg | ORAL_TABLET | Freq: Three times a day (TID) | ORAL | Status: DC | PRN
  Administered 2021-03-10 – 2021-03-14 (×6): 500 mg via ORAL
  Filled 2021-03-10 (×5): qty 1

## 2021-03-10 MED ORDER — METOPROLOL TARTRATE 25 MG PO TABS
25.0000 mg | ORAL_TABLET | Freq: Two times a day (BID) | ORAL | Status: DC
  Administered 2021-03-10 – 2021-03-13 (×8): 25 mg via ORAL
  Filled 2021-03-10 (×8): qty 1

## 2021-03-10 MED ORDER — KETOROLAC TROMETHAMINE 15 MG/ML IJ SOLN
15.0000 mg | Freq: Three times a day (TID) | INTRAMUSCULAR | Status: AC | PRN
  Administered 2021-03-10 – 2021-03-11 (×2): 15 mg via INTRAVENOUS
  Filled 2021-03-10 (×2): qty 1

## 2021-03-10 NOTE — TOC Progression Note (Signed)
Transition of Care [facility]) - Progression Note    Patient Details  Name: [name] Molina MRN: [id number] Date of Birth: 06-06-1945  Transition of Care [facility]) CM/SW [place], [place] Phone Number: 03/10/2021, 4:39 PM  Clinical Narrative:    CSW met with pt at bedside and discussed SNF recommendation. Pt noted that she is agreeable to SNF at discharge with the goal of eventually going back home with her daughter. She noted no preference, but would like to be in [place] or [place]. She has had two covid vaccines and one booster. She is agreeable to CSW reaching out to her dtr, CSW left a voicemail. TOC will continue to follow for discharge needs.   Expected Discharge Plan: [employer] Barriers to Discharge: Continued Medical Work up, SNF Pending bed offer  Expected Discharge Plan and Services Expected Discharge Plan: [place]   Discharge Planning Services: CM Consult   Living arrangements for the past 2 months: Single Family Home                                       Social Determinants of Health (SDOH) Interventions    Readmission Risk Interventions No flowsheet data found.

## 2021-03-10 NOTE — Progress Notes (Signed)
Pharmacy Antibiotic Note  [name] Molina is a 75 y.o. female admitted on 03/07/2021 with pneumonia.  Pharmacy has been consulted for Vanc, Cefepime dosing.   ID: D4 Vanc/Cefepime for pneumonia  - finished 8 of 10-day course Doxy + Ceftin PTA  - hx COVID + about a month ago  Afebrile, WBC 15.1>7.1, Scr <1, LA normalized  Vanc 10/19 >> Cefepime 10/19 >>  10/19 blood: IP 10/19 COVID and flu: neg 10/19 Strep Ag: negative 10/19 Legionella Ag: neg 10/20 sputum:  Plan: Vancomycin 1000mg  IV q24h  eAUC 480, Cr rounded to 0.8mg /dL, Vd 0.5 Cefepime 2g IV q8h 10/21 MD notes: recommend another 24 to 48 hours of IV antibiotics .    Height: 5\' 2"  (157.5 cm) Weight: 95.3 kg (210 lb) IBW/kg (Calculated) : 50.1  Temp (24hrs), Avg:98 F (36.7 C), Min:97.6 F (36.4 C), Max:98.4 F (36.9 C)  Recent Labs  Lab 03/05/21 1628 03/07/21 0910 03/07/21 1155 03/07/21 1357 03/08/21 0427 03/09/21 0408  WBC 5.7 15.1*  --   --  7.0 7.1  CREATININE 0.73 0.75  --   --  0.75 0.63  LATICACIDVEN  --  2.1* 1.2 1.3  --   --     Estimated Creatinine Clearance: 64.4 mL/min (by C-G formula based on SCr of 0.63 mg/dL).    Allergies  Allergen Reactions   Food Shortness Of Breath    ALLERGY= MUSHROOMS   Levaquin [Levofloxacin In D5w] Other (See Comments)    "made me want to die."   Tequin Shortness Of Breath   Erythromycin Other (See Comments)    GI UPSET   Oxycodone-Acetaminophen Itching   Codeine Nausea And Vomiting   Penicillins Rash and Other (See Comments)    Reaction occurred during childhood   Prednisone Rash   Wellbutrin [Bupropion Hcl] Other (See Comments)    SHAKING    [name] Molina S. Alford Highland, PharmD, BCPS Clinical Staff Pharmacist [email or web address]  Wayland Salinas 03/10/2021 9:10 AM

## 2021-03-10 NOTE — NC FL2 (Signed)
[place] LEVEL OF CARE SCREENING TOOL     IDENTIFICATION  Patient Name: [name] Molina Birthdate: [birth year] Sex: female Admission Date (Current Location): 03/07/2021  [facility] and [place] Number:  Herbalist and Address:  [facility] [place], [place] [address], [place], [place] [address]      Provider Number: [id number]  Attending Physician Name and Address:  Hosie Poisson, MD  Relative Name and Phone Number:  [name] [phone]    Current Level of Care: Hospital Recommended Level of Care: [place] Prior Approval Number:    Date Approved/Denied:   PASRR Number: TBD, screen running  Discharge Plan: SNF    Current Diagnoses: Patient Active Problem List   Diagnosis Date Noted   Sepsis due to pneumonia ([place]) 44/07/4740   Acute metabolic encephalopathy 59/56/3875   Transient hypotension 03/07/2021   DNR (do not resuscitate) 03/07/2021   Abnormal CT scan, chest 03/05/2021   Anxiety    Hypokalemia    Chronic respiratory failure with hypoxia ([place]) 09/29/2020   Paroxysmal atrial fibrillation ([place]) 08/13/2020   Major depressive disorder 08/13/2020   Chronic diastolic CHF (congestive heart failure) ([place]) 08/13/2020   Elevated troponin level not due myocardial infarction 08/13/2020   Atrial fibrillation with RVR ([place]) 09/18/2019   Acute on chronic respiratory failure with hypoxia ([place]) 09/13/2019   Edema of both lower extremities 05/07/2018   Pulmonary nodule 12/01/2017   Dyspnea 11/26/2017   RUQ abdominal pain 11/27/2015   Suprapubic pain 11/27/2015   Dysphagia 11/27/2015   Abdominal mass, RUQ (right upper quadrant) 11/27/2015   Obstructive sleep apnea 04/06/2015   PAT (paroxysmal atrial tachycardia) ([place]) 08/09/2014   GERD without esophagitis    Mixed hyperlipidemia    Spinal stenosis of lumbar region 03/16/2014   Idiopathic peripheral neuropathy 03/16/2014   Hypertension 06/13/2011   Allergic rhinitis  06/13/2011   Chronic headaches 06/13/2011   COPD (chronic obstructive pulmonary disease) ([place]) 06/13/2011    Orientation RESPIRATION BLADDER Height & Weight     Self, Time, Situation, Place  O2 ([place] cannula 2 L/min) Incontinent Weight: 210 lb (95.3 kg) Height:  5\' 2"  (157.5 cm)  BEHAVIORAL SYMPTOMS/MOOD NEUROLOGICAL BOWEL NUTRITION STATUS  [place]   Incontinent Diet (heart healthy)  AMBULATORY STATUS COMMUNICATION OF NEEDS Skin   Limited Assist Verbally Normal                       Personal Care Assistance Level of Assistance  Bathing, Feeding, Dressing Bathing Assistance: Limited assistance Feeding assistance: Independent Dressing Assistance: Limited assistance     Functional Limitations Info             [place]  PT (By licensed PT), OT (By licensed OT)     PT Frequency: 5x weekly OT Frequency: 5x weekly            Contractures Contractures Info: Not present    Additional Factors Info  Code Status, Allergies Code Status Info: DNR Allergies Info: See EMR           Current Medications (03/10/2021):  This is the current hospital active medication list Current Facility-Administered Medications  Medication Dose Route Frequency Provider Last Rate Last Admin   acetaminophen (TYLENOL) tablet 650 mg  650 mg Oral Q6H PRN Fuller Plan A, MD   650 mg at 03/09/21 2140   Or   acetaminophen (TYLENOL) suppository 650 mg  650 mg Rectal Q6H PRN Norval Morton, MD  alclomethasone (ACLOVATE) 0.35 % cream 1 application  1 application Topical BID PRN Norval Morton, MD       apixaban (ELIQUIS) tablet 5 mg  5 mg Oral BID Fuller Plan A, MD   5 mg at 03/10/21 4656   aspirin EC tablet 81 mg  81 mg Oral QHS Smith, Rondell A, MD   81 mg at 03/09/21 2140   benzonatate (TESSALON) capsule 100 mg  100 mg Oral TID PRN Hosie Poisson, MD       ceFEPIme (MAXIPIME) 2 g in sodium chloride 0.9 % 100 mL IVPB  2 g Intravenous Q8H Smith, Rondell A, MD  200 mL/hr at 03/10/21 0941 2 g at 03/10/21 0941   DULoxetine (CYMBALTA) DR capsule 60 mg  60 mg Oral Daily Fuller Plan A, MD   60 mg at 03/10/21 0936   flecainide (TAMBOCOR) tablet 50 mg  50 mg Oral Q12H Smith, Rondell A, MD   50 mg at 03/10/21 0936   fluticasone (FLONASE) 50 MCG/ACT nasal spray 1 spray  1 spray Each Nare Daily PRN Tamala Julian, Rondell A, MD       fluticasone furoate-vilanterol (BREO ELLIPTA) 100-25 MCG/ACT 1 puff  1 puff Inhalation Daily Fuller Plan A, MD   1 puff at 03/10/21 8127   And   umeclidinium bromide (INCRUSE ELLIPTA) 62.5 MCG/ACT 1 puff  1 puff Inhalation Daily Fuller Plan A, MD   1 puff at 03/10/21 0923   guaiFENesin-dextromethorphan (ROBITUSSIN DM) 100-10 MG/5ML syrup 15 mL  15 mL Oral Q4H PRN Hosie Poisson, MD       levalbuterol (XOPENEX) nebulizer solution 0.63 mg  0.63 mg Nebulization Q6H PRN Tamala Julian, Rondell A, MD       loratadine (CLARITIN) tablet 10 mg  10 mg Oral Daily Tamala Julian, Rondell A, MD   10 mg at 03/10/21 0936   LORazepam (ATIVAN) tablet 0.5-1 mg  0.5-1 mg Oral Daily PRN Fuller Plan A, MD   1 mg at 03/08/21 1422   methocarbamol (ROBAXIN) tablet 500 mg  500 mg Oral Q8H PRN Hosie Poisson, MD   500 mg at 03/10/21 1259   metoprolol tartrate (LOPRESSOR) tablet 25 mg  25 mg Oral BID Hosie Poisson, MD   25 mg at 03/10/21 1526   montelukast (SINGULAIR) tablet 10 mg  10 mg Oral QHS Smith, Rondell A, MD   10 mg at 03/09/21 2140   morphine 2 MG/ML injection 1 mg  1 mg Intravenous Q4H PRN Hosie Poisson, MD       ondansetron (ZOFRAN) tablet 4 mg  4 mg Oral Q6H PRN Fuller Plan A, MD       Or   ondansetron (ZOFRAN) injection 4 mg  4 mg Intravenous Q6H PRN Smith, Rondell A, MD       pravastatin (PRAVACHOL) tablet 40 mg  40 mg Oral QHS Smith, Rondell A, MD   40 mg at 03/09/21 2140   sodium chloride flush (NS) 0.9 % injection 3 mL  3 mL Intravenous Q12H Smith, Rondell A, MD   3 mL at 03/09/21 2140   traMADol (ULTRAM) tablet 50 mg  50 mg Oral Q6H PRN Hosie Poisson, MD    50 mg at 03/10/21 0936   vancomycin (VANCOCIN) IVPB 1000 mg/200 mL premix  1,000 mg Intravenous Q24H Tamala Julian, Rondell A, MD 200 mL/hr at 03/10/21 1221 1,000 mg at 03/10/21 1221     Discharge Medications: Please see discharge summary for a list of discharge medications.  Relevant Imaging Results:  Relevant Lab  Results:   Additional Information SSN: [id number], Melba Coon 08/28/2019, Levan Hurst booster 12/27/2020  Alfredia Ferguson, LCSW

## 2021-03-10 NOTE — Plan of Care (Signed)
  Problem: Activity: Goal: Ability to tolerate increased activity will improve Outcome: Progressing   Problem: Respiratory: Goal: Ability to maintain adequate ventilation will improve Outcome: Progressing   Problem: Clinical Measurements: Goal: Ability to maintain clinical measurements within normal limits will improve Outcome: Progressing

## 2021-03-10 NOTE — Progress Notes (Signed)
PT Cancellation Note  Patient Details Name: DESHONNA TRNKA MRN: [id number] DOB: 04-07-1946   Cancelled Treatment:    Reason Eval/Treat Not Completed: Medical issues which prohibited therapy Pt HR 125 bpm at rest. RN/MD aware.  Wyona Almas, PT, DPT Acute Rehabilitation Services Pager [phone] Office [phone]    Deno Etienne 03/10/2021, 2:35 PM

## 2021-03-10 NOTE — Progress Notes (Signed)
PROGRESS NOTE    [name] Molina  GNF:[id number] DOB: [birth year] DOA: 03/07/2021 PCP: Harlan Stains, MD    Chief Complaint  Patient presents with   Altered Mental Status   Fall    Brief Narrative:    [name] Molina is a 75 y.o. female with medical history significant of COPD with asthma, chronic respiratory failure, diastolic congestive heart failure, paroxysmal atrial fibrillation on chronic anticoagulation, and recent COVID-19 infection who presents with increasing confusion. She had been evaluated by her PCP for the symptoms and had taken 8 days of a 10-day course of doxycycline and then subsequently prescribed a 10-day course of Ceftin without any significant improvement in symptoms.    CT scan at the head, cervical spine, chest, abdomen, and pelvis was obtained noting dependent bibasilar atelectasis and/or consolidation with elements concerning for infection or aspiration superimposed on chronic scarring.  She was started on IV antibiotics and admitted.  She reports persistent coughing and right sided rib pain. Added robaxin and toradol for better pain control.    Assessment & Plan:   Principal Problem:   Sepsis due to pneumonia [facility]) Active Problems:   COPD (chronic obstructive pulmonary disease) (HCC)   Mixed hyperlipidemia   Acute on chronic respiratory failure with hypoxia (HCC)   Paroxysmal atrial fibrillation (HCC)   Chronic diastolic CHF (congestive heart failure) (HCC)   Anxiety   Acute metabolic encephalopathy   Transient hypotension   DNR (do not resuscitate)   Acute on chronic respiratory failure currently requiring up to 3 lit of [place] oxygen.  -probably secondary to CAP.  - she failed 2 courses of antibiotics as outpatient.  - on admission she was febrile, tachycardic, tachypnea, elevated wbc and elevated lactic acid.  - started on IV antibiotics, continue with duonebs.  - follow up culture reports.  - [place] oxygen to keep sats greater than 90%.  Urine strep and  legionella is negative.  Sputum cultures are pending.  - reports right sided pain, will get rib series. Rib series does not show any rib fractures.  - therapy eval ordered, recommending SNF.  - TOC made aware.  -     Acute metabolic encephalopathy probably from acute respiratory failure with hypoxia:  - resolved.  - she is alert and oriented,  - no new complaints.    COPD:  No wheezing heard on exam.  Continue with trelegy, ellipta, singular .     CAD: Continue with aspirin and statin.    Paroxysmal atrial fibrillation  - rate elevated probably from increased work of breathing.  - will add metoprolol for rate control.  Continue with flecainide .  Continue with eliquis for anti coagulation.    Chronic Diastolic heart failure:  Appears to be compensated.     Anxiety and depression:  Continue with home meds.   Hypokalemia:  Replaced. Repeat in am shows improvement.   Right sided lateral chest pain:  SKIN DOES NOT show any lesions.  ? Prodromal for zoster. If pain doe snot improve with IV pain meds. Will do a trial of acyclovir.   DVT prophylaxis: eliquis. Code Status: DNR. Family Communication: none at bedside. Discussed with daughter over the phone.  Disposition:   Status is: Inpatient  Remains inpatient appropriate because: requiring IV antibiotics.        Consultants:  None.   Procedures: None.  Antimicrobials:  Antibiotics Given (last 72 hours)     Date/Time Action Medication Dose Rate   03/07/21 1844 New Bag/Given  ceFEPIme (MAXIPIME) 2 g in sodium chloride 0.9 % 100 mL IVPB 2 g 200 mL/hr   03/08/21 0221 New Bag/Given   ceFEPIme (MAXIPIME) 2 g in sodium chloride 0.9 % 100 mL IVPB 2 g 200 mL/hr   03/08/21 0920 New Bag/Given   ceFEPIme (MAXIPIME) 2 g in sodium chloride 0.9 % 100 mL IVPB 2 g 200 mL/hr   03/08/21 1034 New Bag/Given   vancomycin (VANCOCIN) IVPB 1000 mg/200 mL premix 1,000 mg 200 mL/hr   03/08/21 1741 New Bag/Given   ceFEPIme  (MAXIPIME) 2 g in sodium chloride 0.9 % 100 mL IVPB 2 g 200 mL/hr   03/09/21 0212 New Bag/Given   ceFEPIme (MAXIPIME) 2 g in sodium chloride 0.9 % 100 mL IVPB 2 g 200 mL/hr   03/09/21 1054 New Bag/Given   ceFEPIme (MAXIPIME) 2 g in sodium chloride 0.9 % 100 mL IVPB 2 g 200 mL/hr   03/09/21 1210 New Bag/Given   vancomycin (VANCOCIN) IVPB 1000 mg/200 mL premix 1,000 mg 200 mL/hr   03/09/21 1842 New Bag/Given   ceFEPIme (MAXIPIME) 2 g in sodium chloride 0.9 % 100 mL IVPB 2 g 200 mL/hr   03/10/21 0335 New Bag/Given   ceFEPIme (MAXIPIME) 2 g in sodium chloride 0.9 % 100 mL IVPB 2 g 200 mL/hr   03/10/21 0941 New Bag/Given   ceFEPIme (MAXIPIME) 2 g in sodium chloride 0.9 % 100 mL IVPB 2 g 200 mL/hr   03/10/21 1221 New Bag/Given   vancomycin (VANCOCIN) IVPB 1000 mg/200 mL premix 1,000 mg 200 mL/hr         Subjective: Has persistent right sided pain.   Objective: Vitals:   03/10/21 0554 03/10/21 0923 03/10/21 0938 03/10/21 1653  BP: (!) 144/70  140/68 124/60  Pulse: (!) 117 (!) 123 100 92  Resp: 18 20 20 18   Temp: 97.8 F (36.6 C)  98.2 F (36.8 C) 97.6 F (36.4 C)  TempSrc: Oral  Oral Oral  SpO2: 98% 94% 96% 97%  Weight:      Height:        Intake/Output Summary (Last 24 hours) at 03/10/2021 1733 Last data filed at 03/10/2021 1241 Gross per 24 hour  Intake 680 ml  Output 2350 ml  Net -1670 ml    Filed Weights   03/07/21 0857  Weight: 95.3 kg    Examination:  General exam: elderly lady on 2 lit of [place] oxygen still in distress from pain on the right side.  Respiratory system: Clear to auscultation. Respiratory effort normal. Cardiovascular system: S1 & S2 heard, RRR. No JVD, murmurs, rubs, gallops or clicks. No pedal edema. Gastrointestinal system: Abdomen is nondistended, soft and nontender. Normal bowel sounds heard. Central nervous system: Alert and oriented. No focal neurological deficits. Extremities: Symmetric 5 x 5 power. Skin: No rashes, lesions or  ulcers Psychiatry: Mood & affect appropriate.      Data Reviewed: I have personally reviewed following labs and imaging studies  CBC: Recent Labs  Lab 03/05/21 1628 03/07/21 0910 03/08/21 0427 03/09/21 0408  WBC 5.7 15.1* 7.0 7.1  NEUTROABS 3.9 12.7*  --  5.5  HGB 14.2 12.6 11.1* 11.4*  HCT 43.9 39.2 34.4* 36.0  MCV 82.2 84.5 84.9 84.7  PLT 191.0 158 127* 135*     Basic Metabolic Panel: Recent Labs  Lab 03/05/21 1628 03/07/21 0910 03/08/21 0427 03/09/21 0408 03/10/21 1452  NA 143 137 138 138 131*  K 3.8 3.7 3.3* 3.4* 3.7  CL 102 101 105 106 96*  CO2 31 27 25 25 25   GLUCOSE 90 134* 123* 120* 165*  BUN 13 13 11  7* 7*  CREATININE 0.73 0.75 0.75 0.63 0.59  CALCIUM 9.3 8.3* 8.2* 8.3* 8.3*     GFR: Estimated Creatinine Clearance: 64.4 mL/min (by C-G formula based on SCr of 0.59 mg/dL).  Liver Function Tests: Recent Labs  Lab 03/07/21 0910 03/08/21 0427  AST 22 14*  ALT 15 13  ALKPHOS 90 78  BILITOT 1.6* 1.5*  PROT 6.1* 5.2*  ALBUMIN 3.2* 2.7*     CBG: Recent Labs  Lab 03/07/21 1013  GLUCAP 124*      Recent Results (from the past 240 hour(s))  Blood Culture (routine x 2)     Status: None (Preliminary result)   Collection Time: 03/07/21  9:51 AM   Specimen: BLOOD  Result Value Ref Range Status   Specimen Description BLOOD SITE NOT SPECIFIED  Final   Special Requests   Final    BOTTLES DRAWN AEROBIC AND ANAEROBIC Blood Culture results may not be optimal due to an excessive volume of blood received in culture bottles   Culture   Final    NO GROWTH 3 DAYS Performed at [facility], [place] [address], [place], [place] [address]    Report Status PENDING  Incomplete  Blood Culture (routine x 2)     Status: None (Preliminary result)   Collection Time: 03/07/21  9:57 AM   Specimen: BLOOD  Result Value Ref Range Status   Specimen Description BLOOD SITE NOT SPECIFIED  Final   Special Requests   Final    BOTTLES DRAWN AEROBIC AND ANAEROBIC Blood  Culture results may not be optimal due to an excessive volume of blood received in culture bottles   Culture   Final    NO GROWTH 3 DAYS Performed at [facility], [place] [address], [place], [place] [address]    Report Status PENDING  Incomplete  Resp Panel by RT-PCR (Flu A&B, Covid) Nasopharyngeal Swab     Status: None   Collection Time: 03/07/21 11:05 AM   Specimen: Nasopharyngeal Swab; Nasopharyngeal(NP) swabs in vial transport medium  Result Value Ref Range Status   SARS Coronavirus 2 by RT PCR NEGATIVE NEGATIVE Final    Comment: (NOTE) SARS-CoV-2 target nucleic acids are NOT DETECTED.  The SARS-CoV-2 RNA is generally detectable in upper respiratory specimens during the acute phase of infection. The lowest concentration of SARS-CoV-2 viral copies this assay can detect is 138 copies/mL. A negative result does not preclude SARS-Cov-2 infection and should not be used as the sole basis for treatment or other patient management decisions. A negative result may occur with  improper specimen collection/handling, submission of specimen other than nasopharyngeal swab, presence of viral mutation(s) within the areas targeted by this assay, and inadequate number of viral copies(<138 copies/mL). A negative result must be combined with clinical observations, patient history, and epidemiological information. The expected result is Negative.  Fact Sheet for Patients:  EntrepreneurPulse.com.au  Fact Sheet for Healthcare Providers:  IncredibleEmployment.be  This test is no t yet approved or cleared by the [place] FDA and  has been authorized for detection and/or diagnosis of SARS-CoV-2 by FDA under an Emergency Use Authorization (EUA). This EUA will remain  in effect (meaning this test can be used) for the duration of the COVID-19 declaration under Section 564(b)(1) of the Act, 21 U.S.C.section 360bbb-3(b)(1), unless the authorization is terminated   or revoked sooner.  Influenza A by PCR NEGATIVE NEGATIVE Final   Influenza B by PCR NEGATIVE NEGATIVE Final    Comment: (NOTE) The Xpert Xpress SARS-CoV-2/FLU/RSV plus assay is intended as an aid in the diagnosis of influenza from Nasopharyngeal swab specimens and should not be used as a sole basis for treatment. Nasal washings and aspirates are unacceptable for Xpert Xpress SARS-CoV-2/FLU/RSV testing.  Fact Sheet for Patients: EntrepreneurPulse.com.au  Fact Sheet for Healthcare Providers: IncredibleEmployment.be  This test is not yet approved or cleared by the [place] FDA and has been authorized for detection and/or diagnosis of SARS-CoV-2 by FDA under an Emergency Use Authorization (EUA). This EUA will remain in effect (meaning this test can be used) for the duration of the COVID-19 declaration under Section 564(b)(1) of the Act, 21 U.S.C. section 360bbb-3(b)(1), unless the authorization is terminated or revoked.  Performed at [facility], [place] [address], [place], [place] [address]   Expectorated Sputum Assessment w Gram Stain, Rflx to Resp Cult     Status: None (Preliminary result)   Collection Time: 03/08/21 10:25 AM   Specimen: Expectorated Sputum  Result Value Ref Range Status   Specimen Description EXPECTORATED SPUTUM  Final   Special Requests NONE  Final   Sputum evaluation   Final    THIS SPECIMEN IS ACCEPTABLE FOR SPUTUM CULTURE Performed at [facility], [place] [address], [place], [place] [address]    Report Status PENDING  Incomplete  Culture, Respiratory w Gram Stain     Status: None (Preliminary result)   Collection Time: 03/08/21 10:25 AM  Result Value Ref Range Status   Specimen Description EXPECTORATED SPUTUM  Final   Special Requests NONE Reflexed from [id number]  Final   Gram Stain   Final    FEW SQUAMOUS EPITHELIAL CELLS PRESENT MODERATE WBC SEEN NO ORGANISMS SEEN    Culture   Final     CULTURE REINCUBATED FOR BETTER GROWTH Performed at [facility], [place] [address] [place],  [address]    Report Status PENDING  Incomplete          Radiology Studies: DG Ribs Bilateral W/Chest  Result Date: 03/09/2021 CLINICAL DATA:  Bilateral anterior rib pain after falling a few days ago. Right greater than left rib pain. EXAM: BILATERAL RIBS AND CHEST - 4+ VIEW COMPARISON:  Chest radiographs 03/07/2021.  CT chest 03/07/2021. FINDINGS: No evidence of acute rib fracture or focal rib lesion. There are mild degenerative changes within the spine. Patient is status post lower cervical fusion. The heart size and mediastinal contours are stable with aortic atherosclerosis. There are small pleural effusions with mild atelectasis at both lung bases. No evidence of pneumothorax. Telemetry leads overlie the chest. IMPRESSION: No evidence of rib fracture, pleural effusion or pneumothorax. Stable bibasilar atelectasis. Electronically Signed   By: Richardean Sale M.D.   On: 03/09/2021 14:14        Scheduled Meds:  apixaban  5 mg Oral BID   aspirin EC  81 mg Oral QHS   DULoxetine  60 mg Oral Daily   flecainide  50 mg Oral Q12H   fluticasone furoate-vilanterol  1 puff Inhalation Daily   And   umeclidinium bromide  1 puff Inhalation Daily   loratadine  10 mg Oral Daily   metoprolol tartrate  25 mg Oral BID   montelukast  10 mg Oral QHS   pravastatin  40 mg Oral QHS   sodium chloride flush  3 mL Intravenous Q12H   Continuous  Infusions:  ceFEPime (MAXIPIME) IV 2 g (03/10/21 0941)   vancomycin 1,000 mg (03/10/21 1221)     LOS: 3 days       Hosie Poisson, MD [facility]   To contact the attending provider between 7A-7P or the covering provider during after hours 7P-7A, please log into the web site [email or web address] and access using universal [place] password for that web site. If you do not have the password, please call the hospital operator.  03/10/2021, 5:33 PM

## 2021-03-11 LAB — BASIC METABOLIC PANEL
Anion gap: 10 (ref 5–15)
BUN: 9 mg/dL (ref 8–23)
CO2: 25 mmol/L (ref 22–32)
Calcium: 8.5 mg/dL — ABNORMAL LOW (ref 8.9–10.3)
Chloride: 99 mmol/L (ref 98–111)
Creatinine, Ser: 0.73 mg/dL (ref 0.44–1.00)
GFR, Estimated: >60 mL/min (ref >60)
Glucose, Bld: 116 mg/dL — ABNORMAL HIGH (ref 70–99)
Potassium: 4 mmol/L (ref 3.5–5.1)
Sodium: 134 mmol/L — ABNORMAL LOW (ref 135–145)

## 2021-03-11 LAB — CULTURE, RESPIRATORY W GRAM STAIN: Culture: NORMAL

## 2021-03-11 LAB — GLUCOSE, CAPILLARY: Glucose-Capillary: 124 mg/dL — ABNORMAL HIGH (ref 70–99)

## 2021-03-11 NOTE — TOC Progression Note (Signed)
Transition of Care [facility]) - Progression Note    Patient Details  Name: [name] Molina MRN: [id number] Date of Birth: 07-Sep-1945  Transition of Care [facility]) CM/SW [place], [place] Phone Number: 03/11/2021, 10:58 AM  Clinical Narrative:    CSW returned phone call from pt's dtr and discussed SNF recommendation. Dtr is also agreeable and was advised of [employer]. TOC will continue to follow for discharge needs.   Expected Discharge Plan: [place] Barriers to Discharge: Continued Medical Work up, SNF Pending bed offer  Expected Discharge Plan and Services Expected Discharge Plan: [place]   Discharge Planning Services: CM Consult   Living arrangements for the past 2 months: Single Family Home                                       Social Determinants of Health (SDOH) Interventions    Readmission Risk Interventions No flowsheet data found.

## 2021-03-11 NOTE — Social Work (Signed)
To Whom It May Concern:  Please be advised that the above-named patient will require a short-term nursing home stay - anticipated 30 days or less for rehabilitation and strengthening.  The plan is for return home.  

## 2021-03-11 NOTE — Discharge Instructions (Signed)

## 2021-03-11 NOTE — Progress Notes (Signed)
PROGRESS NOTE    [name] Molina DOB: Mar 22, 1946 DOA: 03/07/2021 PCP: Harlan Stains, MD    Chief Complaint  Patient presents with   Altered Mental Status   Fall    Brief Narrative:    [name] Molina is a 75 y.o. female with medical history significant of COPD with asthma, chronic respiratory failure, diastolic congestive heart failure, paroxysmal atrial fibrillation on chronic anticoagulation, and recent COVID-19 infection who presents with increasing confusion. She had been evaluated by her PCP for the symptoms and had taken 8 days of a 10-day course of doxycycline and then subsequently prescribed a 10-day course of Ceftin without any significant improvement in symptoms.    CT scan at the head, cervical spine, chest, abdomen, and pelvis was obtained noting dependent bibasilar atelectasis and/or consolidation with elements concerning for infection or aspiration superimposed on chronic scarring.  She was started on IV antibiotics and admitted.  She reports persistent coughing and right sided rib pain. Added robaxin and toradol for better pain control. PT seen and examined , she reports pain has resolved.    Assessment & Plan:   Principal Problem:   Sepsis due to pneumonia [facility]) Active Problems:   COPD (chronic obstructive pulmonary disease) (HCC)   Mixed hyperlipidemia   Acute on chronic respiratory failure with hypoxia (HCC)   Paroxysmal atrial fibrillation (HCC)   Chronic diastolic CHF (congestive heart failure) (HCC)   Anxiety   Acute metabolic encephalopathy   Transient hypotension   DNR (do not resuscitate)   Acute on chronic respiratory failure currently requiring up to 3 lit of [place] oxygen.  -probably secondary to CAP.  - she failed 2 courses of antibiotics as outpatient.  - on admission she was febrile, tachycardic, tachypnea, elevated wbc and elevated lactic acid.  - started on IV antibiotics, continue with duonebs. Recommend to stop the antibiotics after  today's dose.  - follow up culture reports.  - [place] oxygen to keep sats greater than 90%.  Urine strep and legionella is negative.  Sputum cultures are pending,  - reports right sided pain, will get rib series. Rib series does not show any rib fractures.  - therapy eval ordered, recommending SNF.  - TOC made aware.  -    Acute metabolic encephalopathy probably from acute respiratory failure with hypoxia:  - resolved.  - she is alert and oriented,  - no new complaints.    COPD:  No wheezing heard on exam.  Continue with trelegy, ellipta, singular .     CAD: Continue with aspirin and statin.    Paroxysmal atrial fibrillation  - rate elevated probably from increased work of breathing.  - will add metoprolol for rate control.  Continue with flecainide .  Continue with eliquis for anti coagulation.    Chronic Diastolic heart failure:  Appears to be compensated.     Anxiety and depression:  Continue with home meds.   Hypokalemia:  Replaced. Repeat in am shows improvement.   Right sided lateral chest pain: resolved.    DVT prophylaxis: eliquis. Code Status: DNR. Family Communication: none at bedside. Discussed with daughter over the phone.  Disposition:   Status is: Inpatient  Remains inpatient appropriate because: requiring IV antibiotics. Unsafe d/c plan.         Consultants:  None.   Procedures: None.  Antimicrobials:  Antibiotics Given (last 72 hours)     Date/Time Action Medication Dose Rate   03/08/21 1741 New Bag/Given   ceFEPIme (MAXIPIME) 2 g  in sodium chloride 0.9 % 100 mL IVPB 2 g 200 mL/hr   03/09/21 0212 New Bag/Given   ceFEPIme (MAXIPIME) 2 g in sodium chloride 0.9 % 100 mL IVPB 2 g 200 mL/hr   03/09/21 1054 New Bag/Given   ceFEPIme (MAXIPIME) 2 g in sodium chloride 0.9 % 100 mL IVPB 2 g 200 mL/hr   03/09/21 1210 New Bag/Given   vancomycin (VANCOCIN) IVPB 1000 mg/200 mL premix 1,000 mg 200 mL/hr   03/09/21 1842 New Bag/Given    ceFEPIme (MAXIPIME) 2 g in sodium chloride 0.9 % 100 mL IVPB 2 g 200 mL/hr   03/10/21 0335 New Bag/Given   ceFEPIme (MAXIPIME) 2 g in sodium chloride 0.9 % 100 mL IVPB 2 g 200 mL/hr   03/10/21 0941 New Bag/Given   ceFEPIme (MAXIPIME) 2 g in sodium chloride 0.9 % 100 mL IVPB 2 g 200 mL/hr   03/10/21 1221 New Bag/Given   vancomycin (VANCOCIN) IVPB 1000 mg/200 mL premix 1,000 mg 200 mL/hr   03/10/21 1849 New Bag/Given   ceFEPIme (MAXIPIME) 2 g in sodium chloride 0.9 % 100 mL IVPB 2 g 200 mL/hr   03/11/21 0153 New Bag/Given   ceFEPIme (MAXIPIME) 2 g in sodium chloride 0.9 % 100 mL IVPB 2 g 200 mL/hr   03/11/21 1448 New Bag/Given   ceFEPIme (MAXIPIME) 2 g in sodium chloride 0.9 % 100 mL IVPB 2 g 200 mL/hr         Subjective: Rib pain resolved.  No chest pain , sob has resolved.   Objective: Vitals:   03/10/21 1653 03/10/21 2148 03/11/21 0504 03/11/21 1023  BP: 124/60 139/72 (!) 156/73 123/67  Pulse: 92 97 (!) 110 (!) 108  Resp: 18 19 18 18   Temp: 97.6 F (36.4 C) 98 F (36.7 C) 98.4 F (36.9 C) 98 F (36.7 C)  TempSrc: Oral  Oral Oral  SpO2: 97% 97% (!) 89% 93%  Weight:      Height:        Intake/Output Summary (Last 24 hours) at 03/11/2021 1118 Last data filed at 03/11/2021 0253 Gross per 24 hour  Intake 480 ml  Output 1175 ml  Net -695 ml    Filed Weights   03/07/21 0857  Weight: 95.3 kg    Examination:  General exam: Appears calm and comfortable on 2l it of [place] oxygen.  Respiratory system: Clear to auscultation. Respiratory effort normal. Cardiovascular system: S1 & S2 heard, RRR. No JVD,  No pedal edema. Gastrointestinal system: Abdomen is nondistended, soft and nontender. Normal bowel sounds heard. Central nervous system: Alert and oriented. No focal neurological deficits. Extremities: Symmetric 5 x 5 power. Skin: No rashes, lesions or ulcers Psychiatry: Mood & affect appropriate.       Data Reviewed: I have personally reviewed following labs and  imaging studies  CBC: Recent Labs  Lab 03/05/21 1628 03/07/21 0910 03/08/21 0427 03/09/21 0408  WBC 5.7 15.1* 7.0 7.1  NEUTROABS 3.9 12.7*  --  5.5  HGB 14.2 12.6 11.1* 11.4*  HCT 43.9 39.2 34.4* 36.0  MCV 82.2 84.5 84.9 84.7  PLT 191.0 158 127* 135*     Basic Metabolic Panel: Recent Labs  Lab 03/07/21 0910 03/08/21 0427 03/09/21 0408 03/10/21 1452 03/11/21 0155  NA 137 138 138 131* 134*  K 3.7 3.3* 3.4* 3.7 4.0  CL 101 105 106 96* 99  CO2 27 25 25 25 25   GLUCOSE 134* 123* 120* 165* 116*  BUN 13 11 7* 7* 9  CREATININE  0.75 0.75 0.63 0.59 0.73  CALCIUM 8.3* 8.2* 8.3* 8.3* 8.5*     GFR: Estimated Creatinine Clearance: 64.4 mL/min (by C-G formula based on SCr of 0.73 mg/dL).  Liver Function Tests: Recent Labs  Lab 03/07/21 0910 03/08/21 0427  AST 22 14*  ALT 15 13  ALKPHOS 90 78  BILITOT 1.6* 1.5*  PROT 6.1* 5.2*  ALBUMIN 3.2* 2.7*     CBG: Recent Labs  Lab 03/07/21 1013  GLUCAP 124*      Recent Results (from the past 240 hour(s))  Blood Culture (routine x 2)     Status: None (Preliminary result)   Collection Time: 03/07/21  9:51 AM   Specimen: BLOOD  Result Value Ref Range Status   Specimen Description BLOOD SITE NOT SPECIFIED  Final   Special Requests   Final    BOTTLES DRAWN AEROBIC AND ANAEROBIC Blood Culture results may not be optimal due to an excessive volume of blood received in culture bottles   Culture   Final    NO GROWTH 3 DAYS Performed at [facility], [place] [address] [place], [place] [address]    Report Status PENDING  Incomplete  Blood Culture (routine x 2)     Status: None (Preliminary result)   Collection Time: 03/07/21  9:57 AM   Specimen: BLOOD  Result Value Ref Range Status   Specimen Description BLOOD SITE NOT SPECIFIED  Final   Special Requests   Final    BOTTLES DRAWN AEROBIC AND ANAEROBIC Blood Culture results may not be optimal due to an excessive volume of blood received in culture bottles   Culture    Final    NO GROWTH 3 DAYS Performed at [facility], [place] [address] [place], [place] [address]    Report Status PENDING  Incomplete  Resp Panel by RT-PCR (Flu A&B, Covid) Nasopharyngeal Swab     Status: None   Collection Time: 03/07/21 11:05 AM   Specimen: Nasopharyngeal Swab; Nasopharyngeal(NP) swabs in vial transport medium  Result Value Ref Range Status   SARS Coronavirus 2 by RT PCR NEGATIVE NEGATIVE Final    Comment: (NOTE) SARS-CoV-2 target nucleic acids are NOT DETECTED.  The SARS-CoV-2 RNA is generally detectable in upper respiratory specimens during the acute phase of infection. The lowest concentration of SARS-CoV-2 viral copies this assay can detect is 138 copies/mL. A negative result does not preclude SARS-Cov-2 infection and should not be used as the sole basis for treatment or other patient management decisions. A negative result may occur with  improper specimen collection/handling, submission of specimen other than nasopharyngeal swab, presence of viral mutation(s) within the areas targeted by this assay, and inadequate number of viral copies(<138 copies/mL). A negative result must be combined with clinical observations, patient history, and epidemiological information. The expected result is Negative.  Fact Sheet for Patients:  EntrepreneurPulse.com.au  Fact Sheet for Healthcare Providers:  IncredibleEmployment.be  This test is no t yet approved or cleared by the [place] FDA and  has been authorized for detection and/or diagnosis of SARS-CoV-2 by FDA under an Emergency Use Authorization (EUA). This EUA will remain  in effect (meaning this test can be used) for the duration of the COVID-19 declaration under Section 564(b)(1) of the Act, 21 U.S.C.section 360bbb-3(b)(1), unless the authorization is terminated  or revoked sooner.       Influenza A by PCR NEGATIVE NEGATIVE Final   Influenza B by PCR NEGATIVE  NEGATIVE Final    Comment: (NOTE) The  Xpert Xpress SARS-CoV-2/FLU/RSV plus assay is intended as an aid in the diagnosis of influenza from Nasopharyngeal swab specimens and should not be used as a sole basis for treatment. Nasal washings and aspirates are unacceptable for Xpert Xpress SARS-CoV-2/FLU/RSV testing.  Fact Sheet for Patients: EntrepreneurPulse.com.au  Fact Sheet for Healthcare Providers: IncredibleEmployment.be  This test is not yet approved or cleared by the [place] FDA and has been authorized for detection and/or diagnosis of SARS-CoV-2 by FDA under an Emergency Use Authorization (EUA). This EUA will remain in effect (meaning this test can be used) for the duration of the COVID-19 declaration under Section 564(b)(1) of the Act, 21 U.S.C. section 360bbb-3(b)(1), unless the authorization is terminated or revoked.  Performed at [facility], [place] [address] [place], [place] [address]   Expectorated Sputum Assessment w Gram Stain, Rflx to Resp Cult     Status: None   Collection Time: 03/08/21 10:25 AM   Specimen: Expectorated Sputum  Result Value Ref Range Status   Specimen Description EXPECTORATED SPUTUM  Final   Special Requests NONE  Final   Sputum evaluation   Final    THIS SPECIMEN IS ACCEPTABLE FOR SPUTUM CULTURE Performed at [facility], [place] [address], [place], [place] [address]    Report Status 03/10/2021 FINAL  Final  Culture, Respiratory w Gram Stain     Status: None (Preliminary result)   Collection Time: 03/08/21 10:25 AM  Result Value Ref Range Status   Specimen Description EXPECTORATED SPUTUM  Final   Special Requests NONE Reflexed from [id number]  Final   Gram Stain   Final    FEW SQUAMOUS EPITHELIAL CELLS PRESENT MODERATE WBC SEEN NO ORGANISMS SEEN    Culture   Final    CULTURE REINCUBATED FOR BETTER GROWTH Performed at [facility], [place] [address], [place],  [address]    Report  Status PENDING  Incomplete          Radiology Studies: DG Ribs Bilateral W/Chest  Result Date: 03/09/2021 CLINICAL DATA:  Bilateral anterior rib pain after falling a few days ago. Right greater than left rib pain. EXAM: BILATERAL RIBS AND CHEST - 4+ VIEW COMPARISON:  Chest radiographs 03/07/2021.  CT chest 03/07/2021. FINDINGS: No evidence of acute rib fracture or focal rib lesion. There are mild degenerative changes within the spine. Patient is status post lower cervical fusion. The heart size and mediastinal contours are stable with aortic atherosclerosis. There are small pleural effusions with mild atelectasis at both lung bases. No evidence of pneumothorax. Telemetry leads overlie the chest. IMPRESSION: No evidence of rib fracture, pleural effusion or pneumothorax. Stable bibasilar atelectasis. Electronically Signed   By: Richardean Sale M.D.   On: 03/09/2021 14:14        Scheduled Meds:  apixaban  5 mg Oral BID   aspirin EC  81 mg Oral QHS   DULoxetine  60 mg Oral Daily   flecainide  50 mg Oral Q12H   fluticasone furoate-vilanterol  1 puff Inhalation Daily   And   umeclidinium bromide  1 puff Inhalation Daily   loratadine  10 mg Oral Daily   metoprolol tartrate  25 mg Oral BID   montelukast  10 mg Oral QHS   pravastatin  40 mg Oral QHS   sodium chloride flush  3 mL Intravenous Q12H   Continuous Infusions:  ceFEPime (MAXIPIME) IV 2 g (03/11/21 0952)     LOS: 4 days       Logen Fowle  Karleen Hampshire, MD [facility]   To contact the attending provider between 7A-7P or the covering provider during after hours 7P-7A, please log into the web site [email or web address] and access using universal [place] password for that web site. If you do not have the password, please call the hospital operator.  03/11/2021, 11:18 AM

## 2021-03-12 DIAGNOSIS — J189 Pneumonia, unspecified organism: Secondary | ICD-10-CM | POA: Diagnosis not present

## 2021-03-12 DIAGNOSIS — I5032 Chronic diastolic (congestive) heart failure: Secondary | ICD-10-CM | POA: Diagnosis not present

## 2021-03-12 DIAGNOSIS — J9621 Acute and chronic respiratory failure with hypoxia: Secondary | ICD-10-CM | POA: Diagnosis not present

## 2021-03-12 DIAGNOSIS — G9341 Metabolic encephalopathy: Secondary | ICD-10-CM | POA: Diagnosis not present

## 2021-03-12 LAB — CULTURE, BLOOD (ROUTINE X 2)
Culture: NO GROWTH
Culture: NO GROWTH

## 2021-03-12 LAB — RESP PANEL BY RT-PCR (FLU A&B, COVID) ARPGX2
Influenza A by PCR: NEGATIVE
Influenza B by PCR: NEGATIVE
SARS Coronavirus 2 by RT PCR: POSITIVE — AB

## 2021-03-12 NOTE — Care Management Important Message (Signed)
Important Message  Patient Details  Name: [name] Molina MRN: [id number] Date of Birth: Oct 21, 1945   Medicare Important Message Given:  Yes     Orbie Pyo 03/12/2021, 4:32 PM

## 2021-03-12 NOTE — Progress Notes (Signed)
PROGRESS NOTE    [name] Molina  YTK:[id number] DOB: Oct 12, 1945 DOA: 03/07/2021 PCP: Harlan Stains, MD    Chief Complaint  Patient presents with   Altered Mental Status   Fall    Brief Narrative:    [name] Molina is a 75 y.o. female with medical history significant of COPD with asthma, chronic respiratory failure, diastolic congestive heart failure, paroxysmal atrial fibrillation on chronic anticoagulation, and recent COVID-19 infection who presents with increasing confusion. She had been evaluated by her PCP for the symptoms and had taken 8 days of a 10-day course of doxycycline and then subsequently prescribed a 10-day course of Ceftin without any significant improvement in symptoms.    CT scan at the head, cervical spine, chest, abdomen, and pelvis was obtained noting dependent bibasilar atelectasis and/or consolidation with elements concerning for infection or aspiration superimposed on chronic scarring.  She was started on IV antibiotics and admitted.  She reports persistent coughing and right sided rib pain. Added robaxin and toradol for better pain control. PT seen and examined , she reports pain has resolved.    Assessment & Plan:   Principal Problem:   Sepsis due to pneumonia [facility]) Active Problems:   COPD (chronic obstructive pulmonary disease) (HCC)   Mixed hyperlipidemia   Acute on chronic respiratory failure with hypoxia (HCC)   Paroxysmal atrial fibrillation (HCC)   Chronic diastolic CHF (congestive heart failure) (HCC)   Anxiety   Acute metabolic encephalopathy   Transient hypotension   DNR (do not resuscitate)   Acute on chronic respiratory failure currently requiring up to 3 lit of [place] oxygen.  -probably secondary to CAP.  - she failed 2 courses of antibiotics as outpatient.  - on admission she was febrile, tachycardic, tachypnea, elevated wbc and elevated lactic acid.  - started on IV antibiotics, continue with duonebs. - [place] oxygen to keep sats greater than  90%.  Urine strep and legionella is negative.  Sputum cultures are negative for staph.  - reports right sided pain, will get rib series. Rib series does not show any rib fractures.  - therapy eval ordered, recommending SNF.  - TOC made aware.      Acute metabolic encephalopathy probably from acute respiratory failure with hypoxia:  - resolved.  - she is alert and oriented,  - no new complaints.    COPD:  No wheezing heard on exam.  Continue with trelegy, ellipta, singular .     CAD: Continue with aspirin and statin.    Paroxysmal atrial fibrillation  - rate elevated probably from increased work of breathing.  - will add metoprolol for rate control.  Continue with flecainide .  Continue with eliquis for anti coagulation.    Chronic Diastolic heart failure:  Appears to be compensated.     Anxiety and depression:  Continue with home meds.   Hypokalemia:  Replaced. Repeat in am shows improvement.   Right sided lateral chest pain: resolved.    DVT prophylaxis: eliquis. Code Status: DNR. Family Communication: none at bedside. Discussed with daughter over the phone.  Disposition:   Status is: Inpatient  Remains inpatient appropriate because: requiring IV antibiotics. Unsafe d/c plan.         Consultants:  None.   Procedures: None.  Antimicrobials:  Antibiotics Given (last 72 hours)     Date/Time Action Medication Dose Rate   03/09/21 1842 New Bag/Given   ceFEPIme (MAXIPIME) 2 g in sodium chloride 0.9 % 100 mL IVPB 2 g 200 mL/hr  03/10/21 0335 New Bag/Given   ceFEPIme (MAXIPIME) 2 g in sodium chloride 0.9 % 100 mL IVPB 2 g 200 mL/hr   03/10/21 0941 New Bag/Given   ceFEPIme (MAXIPIME) 2 g in sodium chloride 0.9 % 100 mL IVPB 2 g 200 mL/hr   03/10/21 1221 New Bag/Given   vancomycin (VANCOCIN) IVPB 1000 mg/200 mL premix 1,000 mg 200 mL/hr   03/10/21 1849 New Bag/Given   ceFEPIme (MAXIPIME) 2 g in sodium chloride 0.9 % 100 mL IVPB 2 g 200 mL/hr    03/11/21 0153 New Bag/Given   ceFEPIme (MAXIPIME) 2 g in sodium chloride 0.9 % 100 mL IVPB 2 g 200 mL/hr   03/11/21 1884 New Bag/Given   ceFEPIme (MAXIPIME) 2 g in sodium chloride 0.9 % 100 mL IVPB 2 g 200 mL/hr   03/11/21 1943 New Bag/Given   ceFEPIme (MAXIPIME) 2 g in sodium chloride 0.9 % 100 mL IVPB 2 g 200 mL/hr         Subjective: Rib pain resolved.  No chest pain , sob has resolved.   Objective: Vitals:   03/11/21 1630 03/11/21 2122 03/12/21 0554 03/12/21 0954  BP: 138/71 129/89 (!) 143/78 (!) 121/54  Pulse: (!) 102 (!) 138 (!) 103 (!) 108  Resp: 20 16 18 18   Temp: 98.4 F (36.9 C) 98.9 F (37.2 C) 97.6 F (36.4 C) 98 F (36.7 C)  TempSrc: Oral Oral Oral Oral  SpO2: 96% 98% 96% 97%  Weight:      Height:        Intake/Output Summary (Last 24 hours) at 03/12/2021 1517 Last data filed at 03/12/2021 1300 Gross per 24 hour  Intake 1020 ml  Output 275 ml  Net 745 ml    Filed Weights   03/07/21 0857  Weight: 95.3 kg    Examination:  General exam: Appears calm and comfortable  Respiratory system: Clear to auscultation. Respiratory effort normal. Cardiovascular system: S1 & S2 heard, RRR. No JVD,No pedal edema. Gastrointestinal system: Abdomen is nondistended, soft and nontender.  Normal bowel sounds heard. Central nervous system: Alert and oriented. No focal neurological deficits. Extremities: Symmetric 5 x 5 power. Skin: No rashes, lesions or ulcers Psychiatry: Mood & affect appropriate.        Data Reviewed: I have personally reviewed following labs and imaging studies  CBC: Recent Labs  Lab 03/05/21 1628 03/07/21 0910 03/08/21 0427 03/09/21 0408  WBC 5.7 15.1* 7.0 7.1  NEUTROABS 3.9 12.7*  --  5.5  HGB 14.2 12.6 11.1* 11.4*  HCT 43.9 39.2 34.4* 36.0  MCV 82.2 84.5 84.9 84.7  PLT 191.0 158 127* 135*     Basic Metabolic Panel: Recent Labs  Lab 03/07/21 0910 03/08/21 0427 03/09/21 0408 03/10/21 1452 03/11/21 0155  NA 137 138 138  131* 134*  K 3.7 3.3* 3.4* 3.7 4.0  CL 101 105 106 96* 99  CO2 27 25 25 25 25   GLUCOSE 134* 123* 120* 165* 116*  BUN 13 11 7* 7* 9  CREATININE 0.75 0.75 0.63 0.59 0.73  CALCIUM 8.3* 8.2* 8.3* 8.3* 8.5*     GFR: Estimated Creatinine Clearance: 64.4 mL/min (by C-G formula based on SCr of 0.73 mg/dL).  Liver Function Tests: Recent Labs  Lab 03/07/21 0910 03/08/21 0427  AST 22 14*  ALT 15 13  ALKPHOS 90 78  BILITOT 1.6* 1.5*  PROT 6.1* 5.2*  ALBUMIN 3.2* 2.7*     CBG: Recent Labs  Lab 03/07/21 1013 03/11/21 2231  GLUCAP 124* 124*  Recent Results (from the past 240 hour(s))  Blood Culture (routine x 2)     Status: None   Collection Time: 03/07/21  9:51 AM   Specimen: BLOOD  Result Value Ref Range Status   Specimen Description BLOOD SITE NOT SPECIFIED  Final   Special Requests   Final    BOTTLES DRAWN AEROBIC AND ANAEROBIC Blood Culture results may not be optimal due to an excessive volume of blood received in culture bottles   Culture   Final    NO GROWTH 5 DAYS Performed at [facility] [address] [place], [place] [address]    Report Status 03/12/2021 FINAL  Final  Blood Culture (routine x 2)     Status: None   Collection Time: 03/07/21  9:57 AM   Specimen: BLOOD  Result Value Ref Range Status   Specimen Description BLOOD SITE NOT SPECIFIED  Final   Special Requests   Final    BOTTLES DRAWN AEROBIC AND ANAEROBIC Blood Culture results may not be optimal due to an excessive volume of blood received in culture bottles   Culture   Final    NO GROWTH 5 DAYS Performed at [facility], [place] [address] [place], [place] [address]    Report Status 03/12/2021 FINAL  Final  Resp Panel by RT-PCR (Flu A&B, Covid) Nasopharyngeal Swab     Status: None   Collection Time: 03/07/21 11:05 AM   Specimen: Nasopharyngeal Swab; Nasopharyngeal(NP) swabs in vial transport medium  Result Value Ref Range Status   SARS Coronavirus 2 by RT PCR NEGATIVE NEGATIVE  Final    Comment: (NOTE) SARS-CoV-2 target nucleic acids are NOT DETECTED.  The SARS-CoV-2 RNA is generally detectable in upper respiratory specimens during the acute phase of infection. The lowest concentration of SARS-CoV-2 viral copies this assay can detect is 138 copies/mL. A negative result does not preclude SARS-Cov-2 infection and should not be used as the sole basis for treatment or other patient management decisions. A negative result may occur with  improper specimen collection/handling, submission of specimen other than nasopharyngeal swab, presence of viral mutation(s) within the areas targeted by this assay, and inadequate number of viral copies(<138 copies/mL). A negative result must be combined with clinical observations, patient history, and epidemiological information. The expected result is Negative.  Fact Sheet for Patients:  EntrepreneurPulse.com.au  Fact Sheet for Healthcare Providers:  IncredibleEmployment.be  This test is no t yet approved or cleared by the [place] FDA and  has been authorized for detection and/or diagnosis of SARS-CoV-2 by FDA under an Emergency Use Authorization (EUA). This EUA will remain  in effect (meaning this test can be used) for the duration of the COVID-19 declaration under Section 564(b)(1) of the Act, 21 U.S.C.section 360bbb-3(b)(1), unless the authorization is terminated  or revoked sooner.       Influenza A by PCR NEGATIVE NEGATIVE Final   Influenza B by PCR NEGATIVE NEGATIVE Final    Comment: (NOTE) The Xpert Xpress SARS-CoV-2/FLU/RSV plus assay is intended as an aid in the diagnosis of influenza from Nasopharyngeal swab specimens and should not be used as a sole basis for treatment. Nasal washings and aspirates are unacceptable for Xpert Xpress SARS-CoV-2/FLU/RSV testing.  Fact Sheet for Patients: EntrepreneurPulse.com.au  Fact Sheet for Healthcare  Providers: IncredibleEmployment.be  This test is not yet approved or cleared by the [place] FDA and has been authorized for detection and/or diagnosis of SARS-CoV-2 by FDA under an Emergency Use Authorization (EUA). This EUA  will remain in effect (meaning this test can be used) for the duration of the COVID-19 declaration under Section 564(b)(1) of the Act, 21 U.S.C. section 360bbb-3(b)(1), unless the authorization is terminated or revoked.  Performed at [facility], [place] [address], [place], [place] [address]   Expectorated Sputum Assessment w Gram Stain, Rflx to Resp Cult     Status: None   Collection Time: 03/08/21 10:25 AM   Specimen: Expectorated Sputum  Result Value Ref Range Status   Specimen Description EXPECTORATED SPUTUM  Final   Special Requests NONE  Final   Sputum evaluation   Final    THIS SPECIMEN IS ACCEPTABLE FOR SPUTUM CULTURE Performed at [facility], [place] [address], [place], [place] [address]    Report Status 03/10/2021 FINAL  Final  Culture, Respiratory w Gram Stain     Status: None   Collection Time: 03/08/21 10:25 AM  Result Value Ref Range Status   Specimen Description EXPECTORATED SPUTUM  Final   Special Requests NONE Reflexed from [id number]  Final   Gram Stain   Final    FEW SQUAMOUS EPITHELIAL CELLS PRESENT MODERATE WBC SEEN NO ORGANISMS SEEN    Culture   Final    RARE Consistent with normal respiratory flora. NO STAPHYLOCOCCUS AUREUS ISOLATED Performed at [facility], [place] [address] [place],  [address]    Report Status 03/11/2021 FINAL  Final          Radiology Studies: No results found.      Scheduled Meds:  apixaban  5 mg Oral BID   aspirin EC  81 mg Oral QHS   DULoxetine  60 mg Oral Daily   flecainide  50 mg Oral Q12H   fluticasone furoate-vilanterol  1 puff Inhalation Daily   And   umeclidinium bromide  1 puff Inhalation Daily   loratadine  10 mg Oral Daily   metoprolol  tartrate  25 mg Oral BID   montelukast  10 mg Oral QHS   pravastatin  40 mg Oral QHS   sodium chloride flush  3 mL Intravenous Q12H   Continuous Infusions:     LOS: 5 days       Hosie Poisson, MD [facility]   To contact the attending provider between 7A-7P or the covering provider during after hours 7P-7A, please log into the web site [email or web address] and access using universal [place] password for that web site. If you do not have the password, please call the hospital operator.  03/12/2021, 3:17 PM

## 2021-03-12 NOTE — Progress Notes (Signed)
Physical Therapy Treatment Patient Details Name: [name] Molina MRN: [id number] DOB: 28-May-1945 Today's Date: 03/12/2021   History of Present Illness 75 y.o. female presents to [facility] hospital on 03/07/2021 with confusion, found to have PNA. PMH includes COPD with asthma, chronic respiratory failure, diastolic congestive heart failure, paroxysmal atrial fibrillation on chronic anticoagulation, and recent COVID-19 infection.    PT Comments    Pt progressing slowly towards her physical therapy goals. She continues with significant deconditioning/weakness, balance deficits, decreased activity tolerance. Pt requiring min assist for transfers and limited ambulation. Able to perform standing ADL task at sink brushing her teeth (SpO2 96% on 2L O2, HR up to 138 bpm) and then in sitting, performed upper body bathing with set up assist. Pt performed x 10 reps of flutter valve and encouraged increased use for mucus production. Continue to recommend SNF to address deficits and maximize functional mobility.    Recommendations for follow up therapy are one component of a multi-disciplinary discharge planning process, led by the attending physician.  Recommendations may be updated based on patient status, additional functional criteria and insurance authorization.  Follow Up Recommendations  Skilled nursing-short term rehab (<3 hours/day)         Equipment Recommendations  None recommended by PT    Recommendations for Other Services       Precautions / Restrictions Precautions Precautions: Fall Precaution Comments: monitor HR Restrictions Weight Bearing Restrictions: No     Mobility  Bed Mobility               General bed mobility comments: OOB in chair    Transfers Overall transfer level: Needs assistance Equipment used: 1 person hand held assist Transfers: Sit to/from Stand;Stand Pivot Transfers Sit to Stand: Min assist Stand pivot transfers: Min assist         General transfer  comment: Pt received on BSC, assist to rise and steady and take pivotal steps over to chair. Pt then able to stand up, pulling on sink, to brush teeth    Ambulation/Gait Ambulation/Gait assistance:  (pt declines due to fatigue)               Stairs             Wheelchair Mobility    Modified Rankin (Stroke Patients Only)       Balance Overall balance assessment: Needs assistance Sitting-balance support: No upper extremity supported;Feet supported Sitting balance-Leahy Scale: Fair     Standing balance support: Single extremity supported Standing balance-Leahy Scale: Poor Standing balance comment: benefits from UE support at sink                            Cognition Arousal/Alertness: Awake/alert Behavior During Therapy: WFL for tasks assessed/performed Overall Cognitive Status: Impaired/Different from baseline Area of Impairment: Memory                     Memory: Decreased short-term memory         General Comments: Pt questioning, "It's Sunday night?" and it's monday morning at 9:40 AM        Exercises General Exercises - Lower Extremity Long Arc Quad: Both;10 reps;Seated Hip Flexion/Marching: Both;10 reps;Seated Other Exercises Other Exercises: x10 reps flutter valve    General Comments        Pertinent Vitals/Pain Pain Assessment: 0-10 Pain Score: 2  Pain Location: back, headache Pain Descriptors / Indicators: Aching;Headache Pain Intervention(s): Monitored during session  Home Living                          Prior Function            PT Goals (current goals can now be found in the care plan section) Acute Rehab PT Goals Patient Stated Goal: to return to independent mobility PT Goal Formulation: With patient Time For Goal Achievement: 03/23/21 Potential to Achieve Goals: Good Progress towards PT goals: Progressing toward goals    Frequency    Min 2X/week      PT Plan Frequency needs to  be updated    Co-evaluation              AM-PAC PT "6 Clicks" Mobility   Outcome Measure  Help needed turning from your back to your side while in a flat bed without using bedrails?: None Help needed moving from lying on your back to sitting on the side of a flat bed without using bedrails?: A Little Help needed moving to and from a bed to a chair (including a wheelchair)?: A Little Help needed standing up from a chair using your arms (e.g., wheelchair or bedside chair)?: A Little Help needed to walk in hospital room?: A Lot Help needed climbing 3-5 steps with a railing? : A Lot 6 Click Score: 17    End of Session Equipment Utilized During Treatment: Oxygen Activity Tolerance: Patient limited by fatigue Patient left: in chair;with call bell/phone within reach;with chair alarm set Nurse Communication: Mobility status PT Visit Diagnosis: Other abnormalities of gait and mobility (R26.89);Muscle weakness (generalized) (M62.81);Pain     Time: 2094-7096 PT Time Calculation (min) (ACUTE ONLY): 37 min  Charges:  $Therapeutic Activity: 23-37 mins                     Wyona Almas, PT, DPT Acute Rehabilitation Services Pager [phone] Office [phone]    Deno Etienne 03/12/2021, 10:20 AM

## 2021-03-12 NOTE — TOC Progression Note (Addendum)
Transition of Care [facility]) - Progression Note    Patient Details  Name: [name] Molina MRN: [id number] Date of Birth: January 29, 1946  [facility]) CM/SW Contact  Sharlet Salina Mila Homer, LCSW Phone Number: 03/12/2021, 4:33 PM  Clinical Narrative:   Visited with [name] to discuss SNF placement and give facility responses. Engaged briefly in conversation regarding his family. Patient reported that she lives with her daughter in [place], her son Leory Plowman lives in [place] and her son Dellis Filbert lives in [place], [place].. [name] was provided with Medicare.gov SNF list with facilities that made a bed offer underlined in red. Patient advised that per MD she is medically ready for discharge. [name] informed CSW that she will talk with her children and provide CSW with a decision tomorrow.  Talked with patient's nurse and requested COVID test be done, anticipating discharge on Tuesday, 10/25.   PASRR number received: [id number] ; Start date: 03/12/2021  End date: 04/11/2021    Expected Discharge Plan: [place] Barriers to Discharge: Continued Medical Work up, SNF Pending bed offer  Expected Discharge Plan and Services Expected Discharge Plan: [place]   Discharge Planning Services: CM Consult   Living arrangements for the past 2 months: Single Family Home                                       Social Determinants of Health (SDOH) Interventions  No SDOH interventions requested or needed at this time  Readmission Risk Interventions No flowsheet data found.

## 2021-03-13 ENCOUNTER — Inpatient Hospital Stay (HOSPITAL_COMMUNITY): Payer: Medicare Other

## 2021-03-13 DIAGNOSIS — K219 Gastro-esophageal reflux disease without esophagitis: Secondary | ICD-10-CM | POA: Diagnosis not present

## 2021-03-13 DIAGNOSIS — G9341 Metabolic encephalopathy: Secondary | ICD-10-CM | POA: Diagnosis not present

## 2021-03-13 DIAGNOSIS — I5032 Chronic diastolic (congestive) heart failure: Secondary | ICD-10-CM | POA: Diagnosis not present

## 2021-03-13 DIAGNOSIS — G43009 Migraine without aura, not intractable, without status migrainosus: Secondary | ICD-10-CM | POA: Diagnosis not present

## 2021-03-13 DIAGNOSIS — J449 Chronic obstructive pulmonary disease, unspecified: Secondary | ICD-10-CM | POA: Diagnosis not present

## 2021-03-13 DIAGNOSIS — F331 Major depressive disorder, recurrent, moderate: Secondary | ICD-10-CM | POA: Diagnosis not present

## 2021-03-13 DIAGNOSIS — I10 Essential (primary) hypertension: Secondary | ICD-10-CM | POA: Diagnosis not present

## 2021-03-13 DIAGNOSIS — J4520 Mild intermittent asthma, uncomplicated: Secondary | ICD-10-CM | POA: Diagnosis not present

## 2021-03-13 DIAGNOSIS — J9621 Acute and chronic respiratory failure with hypoxia: Secondary | ICD-10-CM | POA: Diagnosis not present

## 2021-03-13 DIAGNOSIS — J189 Pneumonia, unspecified organism: Secondary | ICD-10-CM | POA: Diagnosis not present

## 2021-03-13 DIAGNOSIS — I5033 Acute on chronic diastolic (congestive) heart failure: Secondary | ICD-10-CM | POA: Diagnosis not present

## 2021-03-13 DIAGNOSIS — J441 Chronic obstructive pulmonary disease with (acute) exacerbation: Secondary | ICD-10-CM | POA: Diagnosis not present

## 2021-03-13 DIAGNOSIS — I480 Paroxysmal atrial fibrillation: Secondary | ICD-10-CM | POA: Diagnosis not present

## 2021-03-13 DIAGNOSIS — E785 Hyperlipidemia, unspecified: Secondary | ICD-10-CM | POA: Diagnosis not present

## 2021-03-13 LAB — BASIC METABOLIC PANEL
Anion gap: 9 (ref 5–15)
BUN: 6 mg/dL — ABNORMAL LOW (ref 8–23)
CO2: 25 mmol/L (ref 22–32)
Calcium: 8.4 mg/dL — ABNORMAL LOW (ref 8.9–10.3)
Chloride: 99 mmol/L (ref 98–111)
Creatinine, Ser: 0.57 mg/dL (ref 0.44–1.00)
GFR, Estimated: >60 mL/min (ref >60)
Glucose, Bld: 113 mg/dL — ABNORMAL HIGH (ref 70–99)
Potassium: 4.4 mmol/L (ref 3.5–5.1)
Sodium: 133 mmol/L — ABNORMAL LOW (ref 135–145)

## 2021-03-13 LAB — MAGNESIUM: Magnesium: 2.2 mg/dL (ref 1.7–2.4)

## 2021-03-13 MED ORDER — LEVALBUTEROL TARTRATE 45 MCG/ACT IN AERO
2.0000 | INHALATION_SPRAY | Freq: Four times a day (QID) | RESPIRATORY_TRACT | Status: DC | PRN
  Administered 2021-03-13: 2 via RESPIRATORY_TRACT
  Filled 2021-03-13: qty 15

## 2021-03-13 MED ORDER — IPRATROPIUM-ALBUTEROL 20-100 MCG/ACT IN AERS
1.0000 | INHALATION_SPRAY | Freq: Four times a day (QID) | RESPIRATORY_TRACT | Status: DC | PRN
  Filled 2021-03-13 (×2): qty 4

## 2021-03-13 MED ORDER — BENZONATATE 100 MG PO CAPS
200.0000 mg | ORAL_CAPSULE | Freq: Three times a day (TID) | ORAL | Status: DC | PRN
  Administered 2021-03-13: 200 mg via ORAL
  Filled 2021-03-13: qty 2

## 2021-03-13 MED ORDER — ALBUTEROL SULFATE (2.5 MG/3ML) 0.083% IN NEBU: 2.5000 mg | INHALATION_SOLUTION | RESPIRATORY_TRACT | Status: DC | PRN

## 2021-03-13 NOTE — TOC Progression Note (Addendum)
Transition of Care [facility]) - Progression Note    Patient Details  Name: [name] Molina MRN: [id number] Date of Birth: 04/29/1946  Transition [facility] Care [facility]) CM/SW Contact  [name] Salina Mila Homer, LCSW Phone Number: 03/13/2021, 1:48 PM  Clinical Narrative:  Patient received facility responses Monday. Her COVID test came back positive on 10/24 and the accepting facilities were notified earlier today. CSW learned from patient later today that she last had COVID in September 2022, which explains why she is testing positive for COVID now. The following facilities were contacted and updated regarding patient's positive COVID status. Ms. Spilman informed CSW that her daughter is coming to the hospital today.   [facility]: No female beds until Thursday  [facility]: No female beds this week [facility]: Can take patient [facility]: Can take patient [facility]: Can take patient [facility]: Admissions director Tressa Busman informed CSW that he will review patient's information and get back with CSW.  Received call from [facility] (2:08 pm) and they can take patient.   Visited with patient and updated the responses on the Medicare.gov list previously provided to her.  1:59 pm: Call made to daughter [name] Molina [phone]) and message left for her to call CSW.   4:41 pm: Received call from patient's daughter [name] Molina [phone]) regarding SNF placement for her mother. She has and will continue checking out the facilities given to her before making a decision. Discussed her mother testing positive for COVID and after learning she had COVID in September, hospital staff aware that patient will test positive for a period of time, but not actually have the active COVID virus. Daughter was asked to provide her SNF decision by tomorrow afternoon.   CSW will continue to follow and provide SW intervention services through discharge.    Expected Discharge Plan: [place] Barriers to Discharge: Continued Medical Work up, SNF Pending bed offer  Expected Discharge Plan and Services Expected Discharge Plan: [place]   Discharge Planning Services: CM Consult   Living arrangements for the past 2 months: Single Family Home                                       Social Determinants of Health (SDOH) Interventions    Readmission Risk Interventions No flowsheet data found.

## 2021-03-13 NOTE — Progress Notes (Signed)
PROGRESS NOTE    [name] Molina  QIW:[id number] DOB: 1945/08/03 DOA: 03/07/2021 PCP: Harlan Stains, MD    Chief Complaint  Patient presents with   Altered Mental Status   Fall    Brief Narrative:    [name] Molina is a 75 y.o. female with medical history significant of COPD with asthma, chronic respiratory failure, diastolic congestive heart failure, paroxysmal atrial fibrillation on chronic anticoagulation, and recent COVID-19 infection who presents with increasing confusion. She had been evaluated by her PCP for the symptoms and had taken 8 days of a 10-day course of doxycycline and then subsequently prescribed a 10-day course of Ceftin without any significant improvement in symptoms.    CT scan at the head, cervical spine, chest, abdomen, and pelvis was obtained noting dependent bibasilar atelectasis and/or consolidation with elements concerning for infection or aspiration superimposed on chronic scarring.  She was started on IV antibiotics and completed the course.  Pt seen and examined at bedside. She is scheduled to be discharged to SNF pending bed .  Covid testing for discharge to SNF is positive. This is a residual test from previous infection. No new covid symptoms.    Assessment & Plan:   Principal Problem:   Sepsis due to pneumonia [facility]) Active Problems:   COPD (chronic obstructive pulmonary disease) (HCC)   Mixed hyperlipidemia   Acute on chronic respiratory failure with hypoxia (HCC)   Paroxysmal atrial fibrillation (HCC)   Chronic diastolic CHF (congestive heart failure) (HCC)   Anxiety   Acute metabolic encephalopathy   Transient hypotension   DNR (do not resuscitate)   Acute on chronic respiratory failure currently requiring up to 3 lit of [place] oxygen.  -probably secondary to CAP.  - she failed 2 courses of antibiotics as outpatient.  - on admission she was febrile, tachycardic, tachypnea, elevated wbc and elevated lactic acid.  - completed the course of IV  antibiotics.  - [place] oxygen to keep sats greater than 90%.  Urine strep and legionella is negative.  Sputum cultures are negative for staph.  - reports right sided pain,  Rib series does not show any rib fractures.  - therapy eval ordered, recommending SNF.  - TOC made aware.     Acute metabolic encephalopathy probably from acute respiratory failure with hypoxia:  - resolved.  - she is alert and oriented,  - no new complaints.    COPD:  No wheezing heard on exam.  Continue with trelegy, ellipta, singular .     CAD: Continue with aspirin and statin.    Paroxysmal atrial fibrillation  - rate elevated probably from increased work of breathing.  - added metoprolol for better control. Will check magnesium and potassium today.  Continue with flecainide .  Continue with eliquis for anti coagulation.    Chronic Diastolic heart failure:  Appears to be compensated.     Anxiety and depression:  Continue with home meds.   Hypokalemia:  Replaced.   Right sided lateral chest pain: resolved.   Dizziness this morning once 2 days ago, memory deficits.  Pt concerned. No syncope. No focal deficits.  Will get MRI of the brain for further evaluation.    DVT prophylaxis: eliquis. Code Status: DNR. Family Communication: none at bedside. Called her daughter on the phone and left a voicemail.  Disposition:   Status is: Inpatient  Remains inpatient appropriate because: Unsafe d/c plan. Plan for SNF placement when bed available.         Consultants:  None.  Procedures: None.  Antimicrobials:  Antibiotics Given (last 72 hours)     Date/Time Action Medication Dose Rate   03/10/21 1849 New Bag/Given   ceFEPIme (MAXIPIME) 2 g in sodium chloride 0.9 % 100 mL IVPB 2 g 200 mL/hr   03/11/21 0153 New Bag/Given   ceFEPIme (MAXIPIME) 2 g in sodium chloride 0.9 % 100 mL IVPB 2 g 200 mL/hr   03/11/21 4098 New Bag/Given   ceFEPIme (MAXIPIME) 2 g in sodium chloride 0.9 % 100 mL IVPB  2 g 200 mL/hr   03/11/21 1943 New Bag/Given   ceFEPIme (MAXIPIME) 2 g in sodium chloride 0.9 % 100 mL IVPB 2 g 200 mL/hr         Subjective: No chest pain, sob resolved, no rib pain.   Objective: Vitals:   03/12/21 2000 03/13/21 0048 03/13/21 0533 03/13/21 0900  BP: (!) 146/76 (!) 146/64 (!) 127/48 (!) 123/92  Pulse: (!) 120 (!) 109 (!) 121 (!) 128  Resp: 18 18 20 19   Temp: 98 F (36.7 C) 98.3 F (36.8 C) 98 F (36.7 C) 98.2 F (36.8 C)  TempSrc: Oral Oral Oral Oral  SpO2: 96% 96% 99% 96%  Weight:      Height:        Intake/Output Summary (Last 24 hours) at 03/13/2021 1354 Last data filed at 03/13/2021 0900 Gross per 24 hour  Intake 480 ml  Output 200 ml  Net 280 ml    Filed Weights   03/07/21 0857  Weight: 95.3 kg    Examination:  General exam: Appears calm and comfortable  Respiratory system: Air entry fair bilateral, no wheezing heard on exam today. On 2 lit of [place] Oxygen.  Cardiovascular system: S1 & S2 heard, tachycardic, No JVD, No pedal edema. Gastrointestinal system: Abdomen is nondistended, soft and nontender.. Normal bowel sounds heard. Central nervous system: Alert and oriented. No focal neurological deficits. Extremities: Symmetric 5 x 5 power. Skin: No rashes, lesions or ulcers Psychiatry: Mood & affect appropriate.         Data Reviewed: I have personally reviewed following labs and imaging studies  CBC: Recent Labs  Lab 03/07/21 0910 03/08/21 0427 03/09/21 0408  WBC 15.1* 7.0 7.1  NEUTROABS 12.7*  --  5.5  HGB 12.6 11.1* 11.4*  HCT 39.2 34.4* 36.0  MCV 84.5 84.9 84.7  PLT 158 127* 135*     Basic Metabolic Panel: Recent Labs  Lab 03/07/21 0910 03/08/21 0427 03/09/21 0408 03/10/21 1452 03/11/21 0155  NA 137 138 138 131* 134*  K 3.7 3.3* 3.4* 3.7 4.0  CL 101 105 106 96* 99  CO2 27 25 25 25 25   GLUCOSE 134* 123* 120* 165* 116*  BUN 13 11 7* 7* 9  CREATININE 0.75 0.75 0.63 0.59 0.73  CALCIUM 8.3* 8.2* 8.3* 8.3* 8.5*      GFR: Estimated Creatinine Clearance: 64.4 mL/min (by C-G formula based on SCr of 0.73 mg/dL).  Liver Function Tests: Recent Labs  Lab 03/07/21 0910 03/08/21 0427  AST 22 14*  ALT 15 13  ALKPHOS 90 78  BILITOT 1.6* 1.5*  PROT 6.1* 5.2*  ALBUMIN 3.2* 2.7*     CBG: Recent Labs  Lab 03/07/21 1013 03/11/21 2231  GLUCAP 124* 124*      Recent Results (from the past 240 hour(s))  Blood Culture (routine x 2)     Status: None   Collection Time: 03/07/21  9:51 AM   Specimen: BLOOD  Result Value Ref Range Status  Specimen Description BLOOD SITE NOT SPECIFIED  Final   Special Requests   Final    BOTTLES DRAWN AEROBIC AND ANAEROBIC Blood Culture results may not be optimal due to an excessive volume of blood received in culture bottles   Culture   Final    NO GROWTH 5 DAYS Performed at [facility], [place] [address], [place], [place] [address]    Report Status 03/12/2021 FINAL  Final  Blood Culture (routine x 2)     Status: None   Collection Time: 03/07/21  9:57 AM   Specimen: BLOOD  Result Value Ref Range Status   Specimen Description BLOOD SITE NOT SPECIFIED  Final   Special Requests   Final    BOTTLES DRAWN AEROBIC AND ANAEROBIC Blood Culture results may not be optimal due to an excessive volume of blood received in culture bottles   Culture   Final    NO GROWTH 5 DAYS Performed at [facility], [place] [address], [place], [place] [address]    Report Status 03/12/2021 FINAL  Final  Resp Panel by RT-PCR (Flu A&B, Covid) Nasopharyngeal Swab     Status: None   Collection Time: 03/07/21 11:05 AM   Specimen: Nasopharyngeal Swab; Nasopharyngeal(NP) swabs in vial transport medium  Result Value Ref Range Status   SARS Coronavirus 2 by RT PCR NEGATIVE NEGATIVE Final    Comment: (NOTE) SARS-CoV-2 target nucleic acids are NOT DETECTED.  The SARS-CoV-2 RNA is generally detectable in upper respiratory specimens during the acute phase of infection. The  lowest concentration of SARS-CoV-2 viral copies this assay can detect is 138 copies/mL. A negative result does not preclude SARS-Cov-2 infection and should not be used as the sole basis for treatment or other patient management decisions. A negative result may occur with  improper specimen collection/handling, submission of specimen other than nasopharyngeal swab, presence of viral mutation(s) within the areas targeted by this assay, and inadequate number of viral copies(<138 copies/mL). A negative result must be combined with clinical observations, patient history, and epidemiological information. The expected result is Negative.  Fact Sheet for Patients:  EntrepreneurPulse.com.au  Fact Sheet for Healthcare Providers:  IncredibleEmployment.be  This test is no t yet approved or cleared by the [place] FDA and  has been authorized for detection and/or diagnosis of SARS-CoV-2 by FDA under an Emergency Use Authorization (EUA). This EUA will remain  in effect (meaning this test can be used) for the duration of the COVID-19 declaration under Section 564(b)(1) of the Act, 21 U.S.C.section 360bbb-3(b)(1), unless the authorization is terminated  or revoked sooner.       Influenza A by PCR NEGATIVE NEGATIVE Final   Influenza B by PCR NEGATIVE NEGATIVE Final    Comment: (NOTE) The Xpert Xpress SARS-CoV-2/FLU/RSV plus assay is intended as an aid in the diagnosis of influenza from Nasopharyngeal swab specimens and should not be used as a sole basis for treatment. Nasal washings and aspirates are unacceptable for Xpert Xpress SARS-CoV-2/FLU/RSV testing.  Fact Sheet for Patients: EntrepreneurPulse.com.au  Fact Sheet for Healthcare Providers: IncredibleEmployment.be  This test is not yet approved or cleared by the [place] FDA and has been authorized for detection and/or diagnosis of SARS-CoV-2 by FDA under  an Emergency Use Authorization (EUA). This EUA will remain in effect (meaning this test can be used) for the duration of the COVID-19 declaration under Section 564(b)(1) of the Act, 21 U.S.C. section 360bbb-3(b)(1), unless the authorization is terminated or revoked.  Performed at [facility], [place] [address], [place], [place] [address]   Expectorated Sputum Assessment w Gram Stain, Rflx to Resp Cult     Status: None   Collection Time: 03/08/21 10:25 AM   Specimen: Expectorated Sputum  Result Value Ref Range Status   Specimen Description EXPECTORATED SPUTUM  Final   Special Requests NONE  Final   Sputum evaluation   Final    THIS SPECIMEN IS ACCEPTABLE FOR SPUTUM CULTURE Performed at [facility], [place] [address], [place], [place] [address]    Report Status 03/10/2021 FINAL  Final  Culture, Respiratory w Gram Stain     Status: None   Collection Time: 03/08/21 10:25 AM  Result Value Ref Range Status   Specimen Description EXPECTORATED SPUTUM  Final   Special Requests NONE Reflexed from [id number]  Final   Gram Stain   Final    FEW SQUAMOUS EPITHELIAL CELLS PRESENT MODERATE WBC SEEN NO ORGANISMS SEEN    Culture   Final    RARE Consistent with normal respiratory flora. NO STAPHYLOCOCCUS AUREUS ISOLATED Performed at [facility], [place] [address], [place], [place] [address]    Report Status 03/11/2021 FINAL  Final  Resp Panel by RT-PCR (Flu A&B, Covid) Nasopharyngeal Swab     Status: Abnormal   Collection Time: 03/12/21  3:01 PM   Specimen: Nasopharyngeal Swab; Nasopharyngeal(NP) swabs in vial transport medium  Result Value Ref Range Status   SARS Coronavirus 2 by RT PCR POSITIVE (A) NEGATIVE Final    Comment: RESULT CALLED TO, READ BACK BY AND VERIFIED WITH:  NICHOLA GAUNTLETT 1707 ADL  (NOTE) SARS-CoV-2 target nucleic acids are DETECTED.  The SARS-CoV-2 RNA is generally detectable in upper respiratory specimens during the acute phase of infection. Positive  results are indicative of the presence of the identified virus, but do not rule out bacterial infection or co-infection with other pathogens not detected by the test. Clinical correlation with patient history and other diagnostic information is necessary to determine patient infection status. The expected result is Negative.  Fact Sheet for Patients: EntrepreneurPulse.com.au  Fact Sheet for Healthcare Providers: IncredibleEmployment.be  This test is not yet approved or cleared by the [place] FDA and  has been authorized for detection and/or diagnosis of SARS-CoV-2 by FDA under an Emergency Use Authorization (EUA).  This EUA will remain in effect (meaning this test can be used ) for the duration of  the COVID-19 declaration under Section 564(b)(1) of the Act, 21 U.S.C. section 360bbb-3(b)(1), unless the authorization is terminated or revoked sooner.     Influenza A by PCR NEGATIVE NEGATIVE Final   Influenza B by PCR NEGATIVE NEGATIVE Final    Comment: (NOTE) The Xpert Xpress SARS-CoV-2/FLU/RSV plus assay is intended as an aid in the diagnosis of influenza from Nasopharyngeal swab specimens and should not be used as a sole basis for treatment. Nasal washings and aspirates are unacceptable for Xpert Xpress SARS-CoV-2/FLU/RSV testing.  Fact Sheet for Patients: EntrepreneurPulse.com.au  Fact Sheet for Healthcare Providers: IncredibleEmployment.be  This test is not yet approved or cleared by the [place] FDA and has been authorized for detection and/or diagnosis of SARS-CoV-2 by FDA under an Emergency Use Authorization (EUA). This EUA will remain in effect (meaning this test can be used) for the duration of the COVID-19 declaration under Section 564(b)(1) of the Act, 21 U.S.C. section 360bbb-3(b)(1), unless the authorization is terminated or revoked.  Performed at [facility], [place]  [address] [place], [place] [address]  Radiology Studies: No results found.      Scheduled Meds:  apixaban  5 mg Oral BID   aspirin EC  81 mg Oral QHS   DULoxetine  60 mg Oral Daily   flecainide  50 mg Oral Q12H   fluticasone furoate-vilanterol  1 puff Inhalation Daily   And   umeclidinium bromide  1 puff Inhalation Daily   loratadine  10 mg Oral Daily   metoprolol tartrate  25 mg Oral BID   montelukast  10 mg Oral QHS   pravastatin  40 mg Oral QHS   sodium chloride flush  3 mL Intravenous Q12H   Continuous Infusions:     LOS: 6 days       Hosie Poisson, MD [facility]   To contact the attending provider between 7A-7P or the covering provider during after hours 7P-7A, please log into the web site [email or web address] and access using universal [place] password for that web site. If you do not have the password, please call the hospital operator.  03/13/2021, 1:54 PM

## 2021-03-14 ENCOUNTER — Encounter (HOSPITAL_COMMUNITY): Payer: Self-pay | Admitting: Internal Medicine

## 2021-03-14 DIAGNOSIS — A419 Sepsis, unspecified organism: Secondary | ICD-10-CM | POA: Diagnosis not present

## 2021-03-14 DIAGNOSIS — J189 Pneumonia, unspecified organism: Secondary | ICD-10-CM | POA: Diagnosis not present

## 2021-03-14 MED ORDER — METOPROLOL SUCCINATE ER 50 MG PO TB24: 50.0000 mg | ORAL_TABLET | Freq: Every day | ORAL | Status: DC

## 2021-03-14 MED ORDER — METOPROLOL SUCCINATE ER 50 MG PO TB24
50.0000 mg | ORAL_TABLET | Freq: Every day | ORAL | Status: DC
  Administered 2021-03-14: 50 mg via ORAL
  Filled 2021-03-14 (×2): qty 1

## 2021-03-14 MED ORDER — LEVALBUTEROL TARTRATE 45 MCG/ACT IN AERO
1.0000 | INHALATION_SPRAY | Freq: Three times a day (TID) | RESPIRATORY_TRACT | Status: DC
  Administered 2021-03-14 (×2): 1 via RESPIRATORY_TRACT
  Administered 2021-03-15: 2 via RESPIRATORY_TRACT
  Filled 2021-03-14: qty 15

## 2021-03-14 MED ORDER — FUROSEMIDE 40 MG PO TABS
40.0000 mg | ORAL_TABLET | Freq: Every day | ORAL | Status: DC
  Administered 2021-03-14 – 2021-03-15 (×2): 40 mg via ORAL
  Filled 2021-03-14 (×2): qty 1

## 2021-03-14 MED ORDER — METOPROLOL TARTRATE 5 MG/5ML IV SOLN
5.0000 mg | INTRAVENOUS | Status: DC | PRN
  Administered 2021-03-14: 5 mg via INTRAVENOUS
  Filled 2021-03-14: qty 5

## 2021-03-14 MED ORDER — DILTIAZEM HCL ER COATED BEADS 120 MG PO CP24
240.0000 mg | ORAL_CAPSULE | Freq: Every day | ORAL | Status: DC
  Administered 2021-03-14 – 2021-03-15 (×2): 240 mg via ORAL
  Filled 2021-03-14 (×2): qty 2

## 2021-03-14 NOTE — Progress Notes (Signed)
   03/14/21 1100  Assess: MEWS Score  BP (!) 132/115  Pulse Rate (!) 135  Assess: MEWS Score  MEWS Temp 0  MEWS Systolic 0  MEWS Pulse 3  MEWS RR 0  MEWS LOC 0  MEWS Score 3  MEWS Score Color Yellow  Assess: if the MEWS score is Yellow or Red  Were vital signs taken at a resting state? Yes  Focused Assessment No change from prior assessment  Early Detection of Sepsis Score *See Row Information* Low  MEWS guidelines implemented *See Row Information* No, previously yellow, continue vital signs every 4 hours  Treat  MEWS Interventions Administered scheduled meds/treatments  Pain Scale 0-10  Pain Score 2  Pain Intervention(s) Refused  Notify: Provider  Provider Name/Title DR. Gerlean Ren  Date Provider Notified 03/14/21  Time Provider Notified 5301  Notification Type Face-to-face  Notification Reason Other (Comment) (HR:140)  Provider response See new orders  Date of Provider Response 03/14/21  Time of Provider Response 1046

## 2021-03-14 NOTE — Progress Notes (Signed)
Physical Therapy Treatment Patient Details Name: [name] Molina MRN: [id number] DOB: 04-19-1946 Today's Date: 03/14/2021   History of Present Illness 75 y.o. female presents to [facility] hospital on 03/07/2021 with confusion, found to have PNA. PMH includes COPD with asthma, chronic respiratory failure, diastolic congestive heart failure, paroxysmal atrial fibrillation on chronic anticoagulation, and recent COVID-19 infection.    PT Comments    Pt progressing incrementally towards her physical therapy goals. Requiring min assist for transfers and ambulating 10 ft with a walker and close chair follow. SpO2 91% on 2L O2, HR up to 138 bpm. Pt fatigues easily and is diaphoretic. Continues with generalized weakness, balance deficits, decreased activity tolerance. Continue to recommend SNF for ongoing Physical Therapy.      Recommendations for follow up therapy are one component of a multi-disciplinary discharge planning process, led by the attending physician.  Recommendations may be updated based on patient status, additional functional criteria and insurance authorization.  Follow Up Recommendations  Skilled nursing-short term rehab (<3 hours/day)     Assistance Recommended at Discharge Frequent or constant Supervision/Assistance  Equipment Recommendations  None recommended by PT    Recommendations for Other Services       Precautions / Restrictions Precautions Precautions: Fall Precaution Comments: monitor HR Restrictions Weight Bearing Restrictions: No     Mobility  Bed Mobility Overal bed mobility: Needs Assistance Bed Mobility: Sit to Supine     Supine to sit: Min guard;HOB elevated     General bed mobility comments: significantly increased time/effort    Transfers Overall transfer level: Needs assistance Equipment used: Rolling walker (2 wheels) Transfers: Sit to/from Stand Sit to Stand: Min assist           General transfer comment: MinA to rise to stand, cues for  use of momentum    Ambulation/Gait Ambulation/Gait assistance: Min guard Gait Distance (Feet): 10 Feet Assistive device: Rolling walker (2 wheels) Gait Pattern/deviations: Step-through pattern;Decreased stride length;Trunk flexed Gait velocity: decreased Gait velocity interpretation: <1.8 ft/sec, indicate of risk for recurrent falls General Gait Details: Pt with very slow, effortful gait pattern, with increased trunk flexion. Close chair follow utilized. Fatigues easily and becomes diaphoretic   Chief Strategy Officer    Modified Rankin (Stroke Patients Only)       Balance Overall balance assessment: Needs assistance Sitting-balance support: No upper extremity supported;Feet supported Sitting balance-Leahy Scale: Fair     Standing balance support: Bilateral upper extremity supported Standing balance-Leahy Scale: Poor                              Cognition Arousal/Alertness: Awake/alert Behavior During Therapy: WFL for tasks assessed/performed Overall Cognitive Status: Impaired/Different from baseline Area of Impairment: Memory                     Memory: Decreased short-term memory                  Exercises      General Comments        Pertinent Vitals/Pain Pain Assessment: Faces Faces Pain Scale: Hurts a little bit Breathing: normal Negative Vocalization: none Facial Expression: smiling or inexpressive Consolability: no need to console Pain Location: right rib Pain Descriptors / Indicators: Aching Pain Intervention(s): Monitored during session    Home Living  Prior Function            PT Goals (current goals can now be found in the care plan section) Acute Rehab PT Goals Potential to Achieve Goals: Good Progress towards PT goals: Progressing toward goals    Frequency    Min 2X/week      PT Plan Current plan remains appropriate    Co-evaluation               AM-PAC PT "6 Clicks" Mobility   Outcome Measure  Help needed turning from your back to your side while in a flat bed without using bedrails?: None Help needed moving from lying on your back to sitting on the side of a flat bed without using bedrails?: A Little Help needed moving to and from a bed to a chair (including a wheelchair)?: A Little Help needed standing up from a chair using your arms (e.g., wheelchair or bedside chair)?: A Little Help needed to walk in hospital room?: A Little Help needed climbing 3-5 steps with a railing? : Total 6 Click Score: 17    End of Session Equipment Utilized During Treatment: Gait belt;Oxygen Activity Tolerance: Patient tolerated treatment well Patient left: in chair;with call bell/phone within reach;with chair alarm set Nurse Communication: Mobility status PT Visit Diagnosis: Other abnormalities of gait and mobility (R26.89);Muscle weakness (generalized) (M62.81);Pain     Time: 5830-9407 PT Time Calculation (min) (ACUTE ONLY): 20 min  Charges:  $Therapeutic Activity: 8-22 mins                     Wyona Almas, PT, DPT Acute Rehabilitation Services Pager [phone] Office [phone]    Deno Etienne 03/14/2021, 5:24 PM

## 2021-03-14 NOTE — Progress Notes (Signed)
Occupational Therapy Treatment Patient Details Name: [name] Molina MRN: [id number] DOB: 10-Aug-1945 Today's Date: 03/14/2021   History of present illness 75 y.o. female presents to [facility] hospital on 03/07/2021 with confusion, found to have PNA. PMH includes COPD with asthma, chronic respiratory failure, diastolic congestive heart failure, paroxysmal atrial fibrillation on chronic anticoagulation, and recent COVID-19 infection.   OT comments  Patient received in bed and agreeable to OT treatment. Patient had mild complaints of right rib during treatment. Patient demonstrated good mobility and standing balance with min guard for mobility and transfers. Patient required verbal cues for safety with rw use and reaching back to sit.  Patient's HR was 150+ while standing and nursing is aware. Acute OT to continue to follow.    Recommendations for follow up therapy are one component of a multi-disciplinary discharge planning process, led by the attending physician.  Recommendations may be updated based on patient status, additional functional criteria and insurance authorization.    Follow Up Recommendations  Skilled nursing-short term rehab (<3 hours/day)    Assistance Recommended at Discharge Intermittent Supervision/Assistance  Equipment Recommendations  None recommended by OT    Recommendations for Other Services      Precautions / Restrictions Precautions Precautions: Fall Precaution Comments: monitor HR Restrictions Weight Bearing Restrictions: No       Mobility Bed Mobility Overal bed mobility: Needs Assistance Bed Mobility: Sit to Supine     Supine to sit: Min guard;HOB elevated     General bed mobility comments: verbal cues for rail use    Transfers Overall transfer level: Needs assistance Equipment used: Rolling walker (2 wheels) Transfers: Sit to/from [facility] Sit to Stand: Min guard Stand pivot transfers: Min guard         General transfer  comment: performed transfers to [facility] and recliner     Balance Overall balance assessment: Needs assistance Sitting-balance support: No upper extremity supported;Feet supported Sitting balance-Leahy Scale: Fair     Standing balance support: Single extremity supported Standing balance-Leahy Scale: Poor Standing balance comment: stood at sink                           ADL either performed or assessed with clinical judgement   ADL Overall ADL's : Needs assistance/impaired     Grooming: Wash/dry hands;Wash/dry face;Oral care;Min guard;Standing Grooming Details (indicate cue type and reason): performed standing at sink                 Toilet Transfer: Min guard;BSC;Rolling walker (2 wheels) Toilet Transfer Details (indicate cue type and reason): min guard for safety Toileting- Clothing Manipulation and Hygiene: Min guard;Sit to/from stand Toileting - Clothing Manipulation Details (indicate cue type and reason): performed standing at rw     Functional mobility during ADLs: Min guard;Rolling walker (2 wheels) General ADL Comments: HR increased with standing at sink and for toilet hygiene     Vision       Perception     Praxis      Cognition Arousal/Alertness: Awake/alert Behavior During Therapy: WFL for tasks assessed/performed Overall Cognitive Status: Impaired/Different from baseline Area of Impairment: Memory                     Memory: Decreased short-term memory         General Comments: followed commands          Exercises     Shoulder Instructions  General Comments      Pertinent Vitals/ Pain       Pain Assessment: Faces Faces Pain Scale: Hurts a little bit Breathing: normal Negative Vocalization: none Facial Expression: smiling or inexpressive Consolability: no need to console Pain Location: right rib Pain Descriptors / Indicators: Aching Pain Intervention(s): Monitored during session  Home Living                                           Prior Functioning/Environment              Frequency  Min 2X/week        Progress Toward Goals  OT Goals(current goals can now be found in the care plan section)  Progress towards OT goals: Progressing toward goals  Acute Rehab OT Goals OT Goal Formulation: With patient Time For Goal Achievement: 03/22/21 Potential to Achieve Goals: Good ADL Goals Pt Will Perform Grooming: with supervision;standing Pt Will Perform Upper Body Bathing: with set-up;sitting Pt Will Perform Upper Body Dressing: with set-up;sitting Pt Will Transfer to Toilet: with supervision;ambulating;regular height toilet Pt Will Perform Toileting - Clothing Manipulation and hygiene: with set-up;sitting/lateral leans  Plan Discharge plan remains appropriate    Co-evaluation                 AM-PAC OT "6 Clicks" Daily Activity     Outcome Measure   Help from another person eating meals?: None Help from another person taking care of personal grooming?: None Help from another person toileting, which includes using toliet, bedpan, or urinal?: A Lot Help from another person bathing (including washing, rinsing, drying)?: A Lot Help from another person to put on and taking off regular upper body clothing?: A Lot Help from another person to put on and taking off regular lower body clothing?: A Lot 6 Click Score: 16    End of Session Equipment Utilized During Treatment: Gait belt;Rolling walker (2 wheels)  OT Visit Diagnosis: Pain Pain - Right/Left: Right Pain - part of body:  (rib)   Activity Tolerance Patient tolerated treatment well   Patient Left in chair;with call bell/phone within reach;with chair alarm set   Nurse Communication Mobility status        Time: 1020-1047 OT Time Calculation (min): 27 min  Charges: OT General Charges $OT Visit: 1 Visit OT Treatments $Self Care/Home Management : 23-37 mins  Lodema Hong, [place]  Pager [phone] Office [place] 03/14/2021, 11:24 AM

## 2021-03-14 NOTE — Progress Notes (Signed)
PROGRESS NOTE    [name] Molina  GXQ:[id number] DOB: 1945/10/18 DOA: 03/07/2021 PCP: Harlan Stains, MD   Brief Narrative:  75 y.o. female with medical history significant of COPD with asthma, chronic respiratory failure, diastolic congestive heart failure, paroxysmal atrial fibrillation on chronic anticoagulation, and recent COVID-19 infection who presents with increasing confusion. She had been evaluated by her PCP for the symptoms and had taken 8 days of a 10-day course of doxycycline and then subsequently prescribed a 10-day course of Ceftin without any significant improvement in symptoms.  CT scan at the head, cervical spine, chest, abdomen, and pelvis was obtained noting dependent bibasilar atelectasis and/or consolidation with elements concerning for infection or aspiration superimposed on chronic scarring.  She was started on IV antibiotics and completed the course.  Pt seen and examined at bedside. She is scheduled to be discharged to SNF pending bed .  Covid testing for discharge to SNF is positive. This is a residual test from previous infection. No new covid symptoms.    Assessment & Plan:   Principal Problem:   Sepsis due to pneumonia [facility]) Active Problems:   COPD (chronic obstructive pulmonary disease) (HCC)   Mixed hyperlipidemia   Acute on chronic respiratory failure with hypoxia (HCC)   Paroxysmal atrial fibrillation (HCC)   Chronic diastolic CHF (congestive heart failure) (HCC)   Anxiety   Acute metabolic encephalopathy   Transient hypotension   DNR (do not resuscitate)   Acute on chronic respiratory failure currently requiring up to 3 lit of [place] oxygen.  Community-acquired pneumonia -Currently patient remains on 2 L nasal cannula.  Failed 2 courses of outpatient antibiotic treatment.  Completed IV antibiotics here in the hospital.  Continue bronchodilators, I-S/flutter valve.  COVID-19 infection - Initially diagnosed in September 2022 on home test.  I believe this  is residual shedding, likely had false negative upon admission.  She is at least 21 days out of her infection therefore no longer requiring isolation.  Continue supportive care, as needed bronchodilators.    Acute metabolic encephalopathy probably from acute respiratory failure with hypoxia:  -Multifactorial.  Combination of hypoxia, underlying infection, dementia/delirium  Sinus tachycardia - We will resume home Cardizem and Toprol.  IV Lopressor as needed.   COPD:  -Continue home bronchodilators    CAD: Chest pain free Continue with aspirin and statin.      Paroxysmal atrial fibrillation  -Continue home Eliquis, flecainide.  On Toprol and Cardizem.    Chronic Diastolic heart failure:  Appears to be compensated.    Anxiety and depression:  Continue with home meds.    Hypokalemia:  Replaced.    Right sided lateral chest pain: resolved.    Dizziness; resolved = MRI is negative       DVT prophylaxis: Eliquis Code Status: DNR Family Communication:  Daughter Updated.   Status is: Inpatient  Remains inpatient appropriate because: placement, toc working on it .       Nutritional status           Body mass index is 38.41 kg/m.           Subjective: Feeling ok. I have asked her to get out of bed to chair. Use IS/Flutter as much as possible.   Review of Systems Otherwise negative except as per HPI, including: General: Denies fever, chills, night sweats or unintended weight loss. Resp: Denies cough, wheezing, shortness of breath. Cardiac: Denies chest pain, palpitations, orthopnea, paroxysmal nocturnal dyspnea. GI: Denies abdominal pain, nausea, vomiting, diarrhea or constipation  GU: Denies dysuria, frequency, hesitancy or incontinence MS: Denies muscle aches, joint pain or swelling Neuro: Denies headache, neurologic deficits (focal weakness, numbness, tingling), abnormal gait Psych: Denies anxiety, depression, SI/HI/AVH Skin: Denies new rashes or  lesions ID: Denies sick contacts, exotic exposures, travel  Examination:  General exam: Appears calm and comfortable  Respiratory system: mild b/l rhonchi Cardiovascular system: S1 & S2 heard, RRR. No JVD, murmurs, rubs, gallops or clicks. No pedal edema. Gastrointestinal system: Abdomen is nondistended, soft and nontender. No organomegaly or masses felt. Normal bowel sounds heard. Central nervous system: Alert and oriented. No focal neurological deficits. Extremities: Symmetric 5 x 5 power. Skin: No rashes, lesions or ulcers Psychiatry: Judgement and insight appear normal. Mood & affect appropriate.     Objective: Vitals:   03/14/21 0450 03/14/21 0914 03/14/21 1100 03/14/21 1123  BP: (!) 155/70 (!) 124/50 (!) 132/115   Pulse: (!) 116 100 (!) 135 (!) 117  Resp: (!) 22 18  18   Temp: 98 F (36.7 C) 98 F (36.7 C)    TempSrc: Oral     SpO2: 96% 95%  96%  Weight:      Height:        Intake/Output Summary (Last 24 hours) at 03/14/2021 1417 Last data filed at 03/14/2021 1300 Gross per 24 hour  Intake 1166 ml  Output 2225 ml  Net -1059 ml   Filed Weights   03/07/21 0857  Weight: 95.3 kg     Data Reviewed:   CBC: Recent Labs  Lab 03/08/21 0427 03/09/21 0408  WBC 7.0 7.1  NEUTROABS  --  5.5  HGB 11.1* 11.4*  HCT 34.4* 36.0  MCV 84.9 84.7  PLT 127* 295*   Basic Metabolic Panel: Recent Labs  Lab 03/08/21 0427 03/09/21 0408 03/10/21 1452 03/11/21 0155 03/13/21 1439  NA 138 138 131* 134* 133*  K 3.3* 3.4* 3.7 4.0 4.4  CL 105 106 96* 99 99  CO2 25 25 25 25 25   GLUCOSE 123* 120* 165* 116* 113*  BUN 11 7* 7* 9 6*  CREATININE 0.75 0.63 0.59 0.73 0.57  CALCIUM 8.2* 8.3* 8.3* 8.5* 8.4*  MG  --   --   --   --  2.2   GFR: Estimated Creatinine Clearance: 64.4 mL/min (by C-G formula based on SCr of 0.57 mg/dL). Liver Function Tests: Recent Labs  Lab 03/08/21 0427  AST 14*  ALT 13  ALKPHOS 78  BILITOT 1.5*  PROT 5.2*  ALBUMIN 2.7*   No results for  input(s): LIPASE, AMYLASE in the last 168 hours. No results for input(s): AMMONIA in the last 168 hours. Coagulation Profile: No results for input(s): INR, PROTIME in the last 168 hours. Cardiac Enzymes: No results for input(s): CKTOTAL, CKMB, CKMBINDEX, TROPONINI in the last 168 hours. BNP (last 3 results) No results for input(s): PROBNP in the last 8760 hours. HbA1C: No results for input(s): HGBA1C in the last 72 hours. CBG: Recent Labs  Lab 03/11/21 2231  GLUCAP 124*   Lipid Profile: No results for input(s): CHOL, HDL, LDLCALC, TRIG, CHOLHDL, LDLDIRECT in the last 72 hours. Thyroid Function Tests: No results for input(s): TSH, T4TOTAL, FREET4, T3FREE, THYROIDAB in the last 72 hours. Anemia Panel: No results for input(s): VITAMINB12, FOLATE, FERRITIN, TIBC, IRON, RETICCTPCT in the last 72 hours. Sepsis Labs: No results for input(s): PROCALCITON, LATICACIDVEN in the last 168 hours.  Recent Results (from the past 240 hour(s))  Blood Culture (routine x 2)     Status: None  Collection Time: 03/07/21  9:51 AM   Specimen: BLOOD  Result Value Ref Range Status   Specimen Description BLOOD SITE NOT SPECIFIED  Final   Special Requests   Final    BOTTLES DRAWN AEROBIC AND ANAEROBIC Blood Culture results may not be optimal due to an excessive volume of blood received in culture bottles   Culture   Final    NO GROWTH 5 DAYS Performed at [facility], [place] [address] [place], [place] [address]    Report Status 03/12/2021 FINAL  Final  Blood Culture (routine x 2)     Status: None   Collection Time: 03/07/21  9:57 AM   Specimen: BLOOD  Result Value Ref Range Status   Specimen Description BLOOD SITE NOT SPECIFIED  Final   Special Requests   Final    BOTTLES DRAWN AEROBIC AND ANAEROBIC Blood Culture results may not be optimal due to an excessive volume of blood received in culture bottles   Culture   Final    NO GROWTH 5 DAYS Performed at [facility], [place] [address] [place], [place] [address]    Report Status 03/12/2021 FINAL  Final  Resp Panel by RT-PCR (Flu A&B, Covid) Nasopharyngeal Swab     Status: None   Collection Time: 03/07/21 11:05 AM   Specimen: Nasopharyngeal Swab; Nasopharyngeal(NP) swabs in vial transport medium  Result Value Ref Range Status   SARS Coronavirus 2 by RT PCR NEGATIVE NEGATIVE Final    Comment: (NOTE) SARS-CoV-2 target nucleic acids are NOT DETECTED.  The SARS-CoV-2 RNA is generally detectable in upper respiratory specimens during the acute phase of infection. The lowest concentration of SARS-CoV-2 viral copies this assay can detect is 138 copies/mL. A negative result does not preclude SARS-Cov-2 infection and should not be used as the sole basis for treatment or other patient management decisions. A negative result may occur with  improper specimen collection/handling, submission of specimen other than nasopharyngeal swab, presence of viral mutation(s) within the areas targeted by this assay, and inadequate number of viral copies(<138 copies/mL). A negative result must be combined with clinical observations, patient history, and epidemiological information. The expected result is Negative.  Fact Sheet for Patients:  EntrepreneurPulse.com.au  Fact Sheet for Healthcare Providers:  IncredibleEmployment.be  This test is no t yet approved or cleared by the [place] FDA and  has been authorized for detection and/or diagnosis of SARS-CoV-2 by FDA under an Emergency Use Authorization (EUA). This EUA will remain  in effect (meaning this test can be used) for the duration of the COVID-19 declaration under Section 564(b)(1) of the Act, 21 U.S.C.section 360bbb-3(b)(1), unless the authorization is terminated  or revoked sooner.       Influenza A by PCR NEGATIVE NEGATIVE Final   Influenza B by PCR NEGATIVE NEGATIVE Final    Comment: (NOTE) The Xpert Xpress SARS-CoV-2/FLU/RSV plus  assay is intended as an aid in the diagnosis of influenza from Nasopharyngeal swab specimens and should not be used as a sole basis for treatment. Nasal washings and aspirates are unacceptable for Xpert Xpress SARS-CoV-2/FLU/RSV testing.  Fact Sheet for Patients: EntrepreneurPulse.com.au  Fact Sheet for Healthcare Providers: IncredibleEmployment.be  This test is not yet approved or cleared by the [place] FDA and has been authorized for detection and/or diagnosis of SARS-CoV-2 by FDA under an Emergency Use Authorization (EUA). This EUA will remain in effect (meaning this test can be used) for the duration of the COVID-19 declaration under Section 564(b)(1) of  the Act, 21 U.S.C. section 360bbb-3(b)(1), unless the authorization is terminated or revoked.  Performed at [facility], [place] [address] [place], [place] [address]   Expectorated Sputum Assessment w Gram Stain, Rflx to Resp Cult     Status: None   Collection Time: 03/08/21 10:25 AM   Specimen: Expectorated Sputum  Result Value Ref Range Status   Specimen Description EXPECTORATED SPUTUM  Final   Special Requests NONE  Final   Sputum evaluation   Final    THIS SPECIMEN IS ACCEPTABLE FOR SPUTUM CULTURE Performed at [facility], [place] [address], [place], [place] [address]    Report Status 03/10/2021 FINAL  Final  Culture, Respiratory w Gram Stain     Status: None   Collection Time: 03/08/21 10:25 AM  Result Value Ref Range Status   Specimen Description EXPECTORATED SPUTUM  Final   Special Requests NONE Reflexed from [id number]  Final   Gram Stain   Final    FEW SQUAMOUS EPITHELIAL CELLS PRESENT MODERATE WBC SEEN NO ORGANISMS SEEN    Culture   Final    RARE Consistent with normal respiratory flora. NO STAPHYLOCOCCUS AUREUS ISOLATED Performed at [facility], [place] [address], [place],  [address]    Report Status 03/11/2021 FINAL  Final  Resp Panel by RT-PCR  (Flu A&B, Covid) Nasopharyngeal Swab     Status: Abnormal   Collection Time: 03/12/21  3:01 PM   Specimen: Nasopharyngeal Swab; Nasopharyngeal(NP) swabs in vial transport medium  Result Value Ref Range Status   SARS Coronavirus 2 by RT PCR POSITIVE (A) NEGATIVE Final    Comment: RESULT CALLED TO, READ BACK BY AND VERIFIED WITH:  NICHOLA GAUNTLETT 1707 ADL  (NOTE) SARS-CoV-2 target nucleic acids are DETECTED.  The SARS-CoV-2 RNA is generally detectable in upper respiratory specimens during the acute phase of infection. Positive results are indicative of the presence of the identified virus, but do not rule out bacterial infection or co-infection with other pathogens not detected by the test. Clinical correlation with patient history and other diagnostic information is necessary to determine patient infection status. The expected result is Negative.  Fact Sheet for Patients: EntrepreneurPulse.com.au  Fact Sheet for Healthcare Providers: IncredibleEmployment.be  This test is not yet approved or cleared by the [place] FDA and  has been authorized for detection and/or diagnosis of SARS-CoV-2 by FDA under an Emergency Use Authorization (EUA).  This EUA will remain in effect (meaning this test can be used ) for the duration of  the COVID-19 declaration under Section 564(b)(1) of the Act, 21 U.S.C. section 360bbb-3(b)(1), unless the authorization is terminated or revoked sooner.     Influenza A by PCR NEGATIVE NEGATIVE Final   Influenza B by PCR NEGATIVE NEGATIVE Final    Comment: (NOTE) The Xpert Xpress SARS-CoV-2/FLU/RSV plus assay is intended as an aid in the diagnosis of influenza from Nasopharyngeal swab specimens and should not be used as a sole basis for treatment. Nasal washings and aspirates are unacceptable for Xpert Xpress SARS-CoV-2/FLU/RSV testing.  Fact Sheet for Patients: EntrepreneurPulse.com.au  Fact Sheet  for Healthcare Providers: IncredibleEmployment.be  This test is not yet approved or cleared by the [place] FDA and has been authorized for detection and/or diagnosis of SARS-CoV-2 by FDA under an Emergency Use Authorization (EUA). This EUA will remain in effect (meaning this test can be used) for the duration of the COVID-19 declaration under Section 564(b)(1) of the Act, 21 U.S.C. section 360bbb-3(b)(1), unless the authorization  is terminated or revoked.  Performed at [facility], [place] [address] [place], [place] [address]          Radiology Studies: MR BRAIN WO CONTRAST  Result Date: 03/13/2021 CLINICAL DATA:  Mental status change, unknown cause EXAM: MRI HEAD WITHOUT CONTRAST TECHNIQUE: Multiplanar, multiecho pulse sequences of the brain and surrounding structures were obtained without intravenous contrast. COMPARISON:  01/22/2020 FINDINGS: Motion artifact is present. Brain: There is no acute infarction or intracranial hemorrhage. There is no intracranial mass, mass effect, or edema. There is no hydrocephalus or extra-axial fluid collection. Patchy foci of T2 hyperintensity in the supratentorial white matter are nonspecific but probably reflect similar mild to moderate chronic microvascular ischemic changes. Prominence of the ventricles and sulci reflects similar minor parenchymal volume loss. Vascular: Major vessel flow voids at the skull base are preserved. Skull and upper cervical spine: Normal marrow signal is preserved. Sinuses/Orbits: Paranasal sinuses are aerated. Bilateral lens replacements. Other: Sella is unremarkable.  Mastoid air cells are clear. IMPRESSION: No evidence of recent infarction, hemorrhage, or mass. Stable chronic microvascular ischemic changes. Electronically Signed   By: Macy Mis M.D.   On: 03/13/2021 18:41        Scheduled Meds:  apixaban  5 mg Oral BID   aspirin EC  81 mg Oral QHS   diltiazem  240 mg Oral Daily    DULoxetine  60 mg Oral Daily   flecainide  50 mg Oral Q12H   fluticasone furoate-vilanterol  1 puff Inhalation Daily   And   umeclidinium bromide  1 puff Inhalation Daily   furosemide  40 mg Oral Daily   loratadine  10 mg Oral Daily   metoprolol succinate  50 mg Oral QHS   montelukast  10 mg Oral QHS   pravastatin  40 mg Oral QHS   sodium chloride flush  3 mL Intravenous Q12H   Continuous Infusions:   LOS: 7 days   Time spent= 35 mins    Anibal Quinby Arsenio Loader, MD [facility]  If 7PM-7AM, please contact night-coverage  03/14/2021, 2:17 PM

## 2021-03-14 NOTE — TOC Progression Note (Addendum)
Transition of Care [facility]) - Progression Note    Patient Details  Name: [name] Molina MRN: [id number] Date of Birth: 1945-12-07  [facility] CM/SW Contact  Sharlet Salina Mila Homer, LCSW Phone Number: 03/14/2021, 4:02 PM  Clinical Narrative:  Talked with daughter [name] Molina [phone]) regarding SNF decision. Her first choice is [facility] and the second choice is [facility]. Contacted Juliann Pulse, admissions liaison at [facility] and was advised that she will check on bed availability tomorrow and let CSW know. Call made to [facility], admissions director at [facility] (2:59 pm) and message left.  Re-contacted Juliann Pulse at [facility] and informed her that [facility] will need to know by 10 am on Thursday if they can take patient.   **Patient's insurance is Medicare A&B  4:36 pm: Satira Mccallum, admissions director at [facility] regarding patient and [name] Molina being daughter's 2nd choice. Informed Perrin Smack that she will be informed after 10 am on Thursday regarding facility decision.    Expected Discharge Plan: [place] Barriers to Discharge: Continued Medical Work up, SNF Pending bed offer  Expected Discharge Plan and Services Expected Discharge Plan: [place]   Discharge Planning Services: CM Consult   Living arrangements for the past 2 months: Single Family Home                                       Social Determinants of Health (SDOH) Interventions  No SDOH interventions requested or needed at this time.  Readmission Risk Interventions No flowsheet data found.

## 2021-03-14 NOTE — Progress Notes (Signed)
Returned phone call to daughter got her voicemail.

## 2021-03-15 DIAGNOSIS — G9341 Metabolic encephalopathy: Secondary | ICD-10-CM | POA: Diagnosis not present

## 2021-03-15 DIAGNOSIS — K219 Gastro-esophageal reflux disease without esophagitis: Secondary | ICD-10-CM | POA: Diagnosis not present

## 2021-03-15 DIAGNOSIS — G8929 Other chronic pain: Secondary | ICD-10-CM | POA: Diagnosis not present

## 2021-03-15 DIAGNOSIS — H269 Unspecified cataract: Secondary | ICD-10-CM | POA: Diagnosis not present

## 2021-03-15 DIAGNOSIS — F411 Generalized anxiety disorder: Secondary | ICD-10-CM | POA: Diagnosis not present

## 2021-03-15 DIAGNOSIS — F419 Anxiety disorder, unspecified: Secondary | ICD-10-CM | POA: Diagnosis not present

## 2021-03-15 DIAGNOSIS — Z7401 Bed confinement status: Secondary | ICD-10-CM | POA: Diagnosis not present

## 2021-03-15 DIAGNOSIS — Z1159 Encounter for screening for other viral diseases: Secondary | ICD-10-CM | POA: Diagnosis not present

## 2021-03-15 DIAGNOSIS — J189 Pneumonia, unspecified organism: Secondary | ICD-10-CM | POA: Diagnosis not present

## 2021-03-15 DIAGNOSIS — J449 Chronic obstructive pulmonary disease, unspecified: Secondary | ICD-10-CM | POA: Diagnosis not present

## 2021-03-15 DIAGNOSIS — I2510 Atherosclerotic heart disease of native coronary artery without angina pectoris: Secondary | ICD-10-CM | POA: Diagnosis not present

## 2021-03-15 DIAGNOSIS — G609 Hereditary and idiopathic neuropathy, unspecified: Secondary | ICD-10-CM | POA: Diagnosis not present

## 2021-03-15 DIAGNOSIS — A419 Sepsis, unspecified organism: Secondary | ICD-10-CM | POA: Diagnosis not present

## 2021-03-15 DIAGNOSIS — E782 Mixed hyperlipidemia: Secondary | ICD-10-CM | POA: Diagnosis not present

## 2021-03-15 DIAGNOSIS — G4700 Insomnia, unspecified: Secondary | ICD-10-CM | POA: Diagnosis not present

## 2021-03-15 DIAGNOSIS — E669 Obesity, unspecified: Secondary | ICD-10-CM | POA: Diagnosis not present

## 2021-03-15 DIAGNOSIS — D519 Vitamin B12 deficiency anemia, unspecified: Secondary | ICD-10-CM | POA: Diagnosis not present

## 2021-03-15 DIAGNOSIS — F329 Major depressive disorder, single episode, unspecified: Secondary | ICD-10-CM | POA: Diagnosis not present

## 2021-03-15 DIAGNOSIS — E46 Unspecified protein-calorie malnutrition: Secondary | ICD-10-CM | POA: Diagnosis not present

## 2021-03-15 DIAGNOSIS — M48061 Spinal stenosis, lumbar region without neurogenic claudication: Secondary | ICD-10-CM | POA: Diagnosis not present

## 2021-03-15 DIAGNOSIS — R2689 Other abnormalities of gait and mobility: Secondary | ICD-10-CM | POA: Diagnosis not present

## 2021-03-15 DIAGNOSIS — I480 Paroxysmal atrial fibrillation: Secondary | ICD-10-CM | POA: Diagnosis not present

## 2021-03-15 DIAGNOSIS — J9621 Acute and chronic respiratory failure with hypoxia: Secondary | ICD-10-CM | POA: Diagnosis not present

## 2021-03-15 DIAGNOSIS — I5032 Chronic diastolic (congestive) heart failure: Secondary | ICD-10-CM | POA: Diagnosis not present

## 2021-03-15 DIAGNOSIS — J45909 Unspecified asthma, uncomplicated: Secondary | ICD-10-CM | POA: Diagnosis not present

## 2021-03-15 DIAGNOSIS — I959 Hypotension, unspecified: Secondary | ICD-10-CM | POA: Diagnosis not present

## 2021-03-15 DIAGNOSIS — H3530 Unspecified macular degeneration: Secondary | ICD-10-CM | POA: Diagnosis not present

## 2021-03-15 DIAGNOSIS — I10 Essential (primary) hypertension: Secondary | ICD-10-CM | POA: Diagnosis not present

## 2021-03-15 DIAGNOSIS — M6281 Muscle weakness (generalized): Secondary | ICD-10-CM | POA: Diagnosis not present

## 2021-03-15 LAB — BRAIN NATRIURETIC PEPTIDE: B Natriuretic Peptide: 44.8 pg/mL (ref 0.0–100.0)

## 2021-03-15 LAB — PROCALCITONIN: Procalcitonin: 0.1 ng/mL

## 2021-03-15 MED ORDER — LORAZEPAM 0.5 MG PO TABS: 0.5000 mg | ORAL_TABLET | Freq: Every day | ORAL | 0 refills | Status: AC | PRN

## 2021-03-15 MED ORDER — PREDNISONE 20 MG PO TABS: 20.0000 mg | ORAL_TABLET | Freq: Every day | ORAL | 0 refills | Status: DC

## 2021-03-15 NOTE — Consult Note (Signed)
[facility] Inpatient Consult   03/15/2021  TRISTAN BRAMBLE [birth year] [id number]  Patient chart has been reviewed due to unplanned readmissions less than 30 days.  Patient assessed for community [place] [facility] follow up needs.  Chart review reveals patient current disposition is for skilled nursing facility rehab.  No [facility] Care Management needs at this time.  Of note, [facility] Care Management services does not replace or interfere with any services that are arranged by inpatient case management or social work.   Netta Cedars, MSN, RN [facility] [employer] [phone]  Toll free office [phone]

## 2021-03-15 NOTE — Discharge Summary (Signed)
Physician Discharge Summary  HARVEST DEIST XNA:[id number] DOB: [birth year] DOA: 03/07/2021  PCP: Harlan Stains, MD  Admit date: 03/07/2021 Discharge date: 03/15/2021  Admitted From: Home Disposition:  SNF  Recommendations for Outpatient Follow-up:  Follow up with PCP in 1-2 weeks Please obtain BMP/CBC in one week your next doctors visit.  Prednisone for 5 days daily Resume home medications.  Needs to have at least 1 soft bowel movement daily Encourage use of incentive spirometer and flutter valve aggressively Titrate Cardizem and Toprol-XL as needed for sinus tachycardia.  Most of her sinus tachycardia is secondary to deconditioning which I anticipate will improve with therapy Continue aggressive bronchodilator treatment   Discharge Condition: Stable CODE STATUS: DNR Diet recommendation: Heart healthy  Brief/Interim Summary: 75 y.o. female with medical history significant of COPD with asthma, chronic respiratory failure, diastolic congestive heart failure, paroxysmal atrial fibrillation on chronic anticoagulation, and recent COVID-19 infection who presents with increasing confusion. She had been evaluated by her PCP for the symptoms and had taken 8 days of a 10-day course of doxycycline and then subsequently prescribed a 10-day course of Ceftin without any significant improvement in symptoms.  CT scan at the head, cervical spine, chest, abdomen, and pelvis was obtained noting dependent bibasilar atelectasis and/or consolidation with elements concerning for infection or aspiration superimposed on chronic scarring.  She was started on IV antibiotics and completed the course.  Pt seen and examined at bedside. She is scheduled to be discharged to SNF pending bed .  Covid testing for discharge to SNF is positive. This is a residual test from previous infection. No new covid symptoms.   Overall patient is doing well and stable for discharge to rehab. Have called patient's daughter again on the  day of discharge, no response therefore left a voicemail.   Assessment & Plan:   Principal Problem:   Sepsis due to pneumonia [facility]) Active Problems:   COPD (chronic obstructive pulmonary disease) (HCC)   Mixed hyperlipidemia   Acute on chronic respiratory failure with hypoxia (HCC)   Paroxysmal atrial fibrillation (HCC)   Chronic diastolic CHF (congestive heart failure) (HCC)   Anxiety   Acute metabolic encephalopathy   Transient hypotension   DNR (do not resuscitate)     Acute on chronic respiratory failure currently requiring up to 3 lit of [place] oxygen.  Community-acquired pneumonia - Currently on 2 L nasal cannula.  Failed 2 courses of outpatient antibiotic treatment.  Completed IV antibiotics here in the hospital.  Continue bronchodilators, I-S/flutter valve.   COVID-19 infection - Initially diagnosed in September 2022 on home test.  I believe this is residual shedding, likely had false negative upon admission.  She is at least 21 days out of her infection therefore no longer requiring isolation.  Continue supportive care, as needed bronchodilators.    Acute metabolic encephalopathy probably from acute respiratory failure with hypoxia:  -Multifactorial.  Combination of hypoxia, underlying infection, dementia/delirium   Sinus tachycardia - We will resume home Cardizem and Toprol.  IV Lopressor as needed.  Most of the sinus tachycardia is from deconditioning which should improve with therapy.  In the meantime can uptitrate Cardizem/Toprol as necessary.   COPD:  -Continue home bronchodilators    CAD: Chest pain free Continue with aspirin and statin.      Paroxysmal atrial fibrillation  -Continue home Eliquis, flecainide.  On Toprol and Cardizem.    Chronic Diastolic heart failure:  Appears to be compensated.    Anxiety and depression:  Continue with home  meds.    Hypokalemia:  Replaced.    Right sided lateral chest pain: resolved.    Dizziness; resolved = MRI is  negative   Body mass index is 38.87 kg/m.         Discharge Diagnoses:  Principal Problem:   Sepsis due to pneumonia [facility]) Active Problems:   COPD (chronic obstructive pulmonary disease) ([place])   Mixed hyperlipidemia   Acute on chronic respiratory failure with hypoxia (HCC)   Paroxysmal atrial fibrillation (HCC)   Chronic diastolic CHF (congestive heart failure) (HCC)   Anxiety   Acute metabolic encephalopathy   Transient hypotension   DNR (do not resuscitate)      Consultations: None  Subjective: Feels okay no complaint.  She has incentive spirometer and flutter valve at bedside she reports of using periodically throughout the day.  Discharge Exam: Vitals:   03/15/21 0858 03/15/21 0940  BP:  123/63  Pulse: (!) 106 (!) 106  Resp: 19 20  Temp:  98 F (36.7 C)  SpO2: 92% 99%   Vitals:   03/14/21 2055 03/15/21 0504 03/15/21 0858 03/15/21 0940  BP: 126/61 (!) 110/56  123/63  Pulse: (!) 106 99 (!) 106 (!) 106  Resp: 18 18 19 20   Temp: 98.9 F (37.2 C)   98 F (36.7 C)  TempSrc: Oral Oral  Oral  SpO2: 91% 95% 92% 99%  Weight:  96.4 kg    Height:        General: Pt is alert, awake, not in acute distress Cardiovascular: RRR, S1/S2 +, no rubs, no gallops Respiratory: Mild bilateral rhonchi Abdominal: Soft, NT, ND, bowel sounds + Extremities: no edema, no cyanosis  Discharge Instructions   Allergies as of 03/15/2021       Reactions   Food Shortness Of Breath   ALLERGY= MUSHROOMS   Levaquin [levofloxacin In D5w] Other (See Comments)   "made me want to die."   Tequin Shortness Of Breath   Erythromycin Other (See Comments)   GI UPSET   Oxycodone-acetaminophen Itching   Codeine Nausea And Vomiting   Penicillins Rash, Other (See Comments)   Reaction occurred during childhood   Prednisone Rash   Wellbutrin [bupropion Hcl] Other (See Comments)   SHAKING        Medication List     TAKE these medications    acetaminophen 650 MG CR  tablet Commonly known as: TYLENOL Take 1,300 mg by mouth every 8 (eight) hours as needed for pain.   albuterol 108 (90 Base) MCG/ACT inhaler Commonly known as: VENTOLIN HFA Inhale 2 puffs into the lungs every 6 (six) hours as needed for wheezing or shortness of breath.   alclomethasone 0.05 % cream Commonly known as: ACLOVATE Apply topically 2 (two) times daily as needed (Rash). What changed:  how much to take reasons to take this   aspirin EC 81 MG tablet Take 81 mg by mouth at bedtime. Swallow whole.   Besivance 0.6 % Susp Generic drug: Besifloxacin HCl Place 1 drop into the left eye See admin instructions. Instill 1 drop into the left eye 4 times daily the day OF and day AFTER (eye injection)   cetirizine 10 MG tablet Commonly known as: ZYRTEC TAKE ONE TABLET BY MOUTH ONCE DAILY What changed: when to take this   diltiazem 240 MG 24 hr capsule Commonly known as: CARDIZEM CD Take 1 capsule (240 mg total) by mouth daily.   DULoxetine 60 MG capsule Commonly known as: CYMBALTA Take 60 mg by mouth every morning.  Eliquis 5 MG Tabs tablet Generic drug: apixaban Take 5 mg by mouth 2 (two) times daily.   flecainide 50 MG tablet Commonly known as: TAMBOCOR Take 1 tablet (50 mg total) by mouth every 12 (twelve) hours.   fluticasone 50 MCG/ACT nasal spray Commonly known as: FLONASE Place 1 spray into both nostrils daily. What changed:  when to take this reasons to take this   furosemide 40 MG tablet Commonly known as: LASIX Take 1 tablet (40 mg total) by mouth daily.   ipratropium-albuterol 0.5-2.5 (3) MG/3ML Soln Commonly known as: DUONEB INHALE contents of one vial using nebulizer TWICE DAILY What changed: See the new instructions.   LORazepam 0.5 MG tablet Commonly known as: ATIVAN Take 1-2 tablets (0.5-1 mg total) by mouth daily as needed for anxiety. Anxiety   metoprolol succinate 50 MG 24 hr tablet Commonly known as: TOPROL-XL TAKE ONE TABLET BY MOUTH  EVERYDAY AT BEDTIME What changed: See the new instructions.   montelukast 10 MG tablet Commonly known as: SINGULAIR Take 10 mg by mouth at bedtime.   mupirocin ointment 2 % Commonly known as: BACTROBAN Apply 1 application topically 2 (two) times daily. What changed:  when to take this reasons to take this   omeprazole 20 MG tablet Commonly known as: PRILOSEC OTC Take 20 mg by mouth at bedtime.   ondansetron 4 MG tablet Commonly known as: Zofran Take 1 tablet (4 mg total) by mouth every 8 (eight) hours as needed for nausea or vomiting.   pravastatin 40 MG tablet Commonly known as: PRAVACHOL Take 40 mg by mouth at bedtime.   predniSONE 20 MG tablet Commonly known as: DELTASONE Take 1 tablet (20 mg total) by mouth daily with breakfast.   PreserVision AREDS 2+Multi Vit Caps Take 1 tablet by mouth 2 (two) times daily.   promethazine-dextromethorphan 6.25-15 MG/5ML syrup Commonly known as: PROMETHAZINE-DM Take 5 mLs by mouth at bedtime.   Trelegy Ellipta 100-62.5-25 MCG/ACT Aepb Generic drug: Fluticasone-Umeclidin-Vilant INHALE 1 PUFF BY MOUTH EVERY DAY What changed: See the new instructions.   VITAMIN B-12 PO Take 2,500 mcg by mouth daily.        Allergies  Allergen Reactions   Food Shortness Of Breath    ALLERGY= MUSHROOMS   Levaquin [Levofloxacin In D5w] Other (See Comments)    "made me want to die."   Tequin Shortness Of Breath   Erythromycin Other (See Comments)    GI UPSET   Oxycodone-Acetaminophen Itching   Codeine Nausea And Vomiting   Penicillins Rash and Other (See Comments)    Reaction occurred during childhood   Prednisone Rash   Wellbutrin [Bupropion Hcl] Other (See Comments)    SHAKING    You were cared for by a hospitalist during your hospital stay. If you have any questions about your discharge medications or the care you received while you were in the hospital after you are discharged, you can call the unit and asked to speak with the  hospitalist on call if the hospitalist that took care of you is not available. Once you are discharged, your primary care physician will handle any further medical issues. Please note that no refills for any discharge medications will be authorized once you are discharged, as it is imperative that you return to your primary care physician (or establish a relationship with a primary care physician if you do not have one) for your aftercare needs so that they can reassess your need for medications and monitor your lab values.  Procedures/Studies: DG Chest 2 View  Result Date: 03/06/2021 CLINICAL DATA:  75 year old female with cough EXAM: CHEST - 2 VIEW COMPARISON:  11/16/2020, CT 11/16/2020 FINDINGS: Cardiomediastinal silhouette unchanged in size and contour. Stigmata of emphysema, with increased retrosternal airspace, flattened hemidiaphragms, increased AP diameter, and hyperinflation on the AP view. Similar appearance of coarsened interstitial markings, with increased conspicuity of reticulonodular opacities throughout. No interlobular septal thickening. No pneumothorax. No pleural effusion. No airspace disease. No displaced fracture. Degenerative changes of the spine. Surgical changes of the cervical region incompletely imaged. IMPRESSION: Mild reticulonodular opacities, may reflect atypical infection. Background of emphysema/chronic changes Electronically Signed   By: Corrie Mckusick D.O.   On: 03/06/2021 13:01   DG Ribs Bilateral W/Chest  Result Date: 03/09/2021 CLINICAL DATA:  Bilateral anterior rib pain after falling a few days ago. Right greater than left rib pain. EXAM: BILATERAL RIBS AND CHEST - 4+ VIEW COMPARISON:  Chest radiographs 03/07/2021.  CT chest 03/07/2021. FINDINGS: No evidence of acute rib fracture or focal rib lesion. There are mild degenerative changes within the spine. Patient is status post lower cervical fusion. The heart size and mediastinal contours are stable with aortic  atherosclerosis. There are small pleural effusions with mild atelectasis at both lung bases. No evidence of pneumothorax. Telemetry leads overlie the chest. IMPRESSION: No evidence of rib fracture, pleural effusion or pneumothorax. Stable bibasilar atelectasis. Electronically Signed   By: Richardean Sale M.D.   On: 03/09/2021 14:14   CT HEAD WO CONTRAST (5MM)  Result Date: 03/07/2021 CLINICAL DATA:  Head trauma, mod-severe EXAM: CT HEAD WITHOUT CONTRAST TECHNIQUE: Contiguous axial images were obtained from the base of the skull through the vertex without intravenous contrast. COMPARISON:  None. FINDINGS: Brain: There is no acute intracranial hemorrhage, mass effect, or edema. Gray-white differentiation is preserved. There is no extra-axial fluid collection. Ventricles and sulci are within normal limits in size and configuration. Patchy low-attenuation in the supratentorial white matter is nonspecific but may reflect chronic microvascular ischemic changes. Vascular: There is atherosclerotic calcification at the skull base. Skull: Calvarium is unremarkable. Sinuses/Orbits: No acute finding. Other: None. IMPRESSION: No evidence of acute intracranial injury. Electronically Signed   By: Macy Mis M.D.   On: 03/07/2021 11:09   CT Cervical Spine Wo Contrast  Result Date: 03/07/2021 CLINICAL DATA:  Neck trauma, mechanically unstable spine (Age >= 16y) EXAM: CT CERVICAL SPINE WITHOUT CONTRAST TECHNIQUE: Multidetector CT imaging of the cervical spine was performed without intravenous contrast. Multiplanar CT image reconstructions were also generated. COMPARISON:  None. FINDINGS: Alignment: No significant listhesis. Skull base and vertebrae: Postoperative changes of ACDF at C5-C6. Vertebral body heights are maintained. No acute fracture. Soft tissues and spinal canal: No prevertebral fluid or swelling. No visible canal hematoma. Disc levels:  Mild degenerative changes are present. Upper chest: No apical lung  mass. Other: Mild calcified plaque at the common carotid bifurcations. IMPRESSION: No acute cervical spine fracture. Electronically Signed   By: Macy Mis M.D.   On: 03/07/2021 11:13   MR BRAIN WO CONTRAST  Result Date: 03/13/2021 CLINICAL DATA:  Mental status change, unknown cause EXAM: MRI HEAD WITHOUT CONTRAST TECHNIQUE: Multiplanar, multiecho pulse sequences of the brain and surrounding structures were obtained without intravenous contrast. COMPARISON:  01/22/2020 FINDINGS: Motion artifact is present. Brain: There is no acute infarction or intracranial hemorrhage. There is no intracranial mass, mass effect, or edema. There is no hydrocephalus or extra-axial fluid collection. Patchy foci of T2 hyperintensity in the supratentorial white matter are nonspecific  but probably reflect similar mild to moderate chronic microvascular ischemic changes. Prominence of the ventricles and sulci reflects similar minor parenchymal volume loss. Vascular: Major vessel flow voids at the skull base are preserved. Skull and upper cervical spine: Normal marrow signal is preserved. Sinuses/Orbits: Paranasal sinuses are aerated. Bilateral lens replacements. Other: Sella is unremarkable.  Mastoid air cells are clear. IMPRESSION: No evidence of recent infarction, hemorrhage, or mass. Stable chronic microvascular ischemic changes. Electronically Signed   By: Macy Mis M.D.   On: 03/13/2021 18:41   CT CHEST ABDOMEN PELVIS W CONTRAST  Result Date: 03/07/2021 CLINICAL DATA:  Fall yesterday EXAM: CT CHEST, ABDOMEN, AND PELVIS WITH CONTRAST TECHNIQUE: Multidetector CT imaging of the chest, abdomen and pelvis was performed following the standard protocol during bolus administration of intravenous contrast. CONTRAST:  82mL OMNIPAQUE IOHEXOL 300 MG/ML  SOLN COMPARISON:  CT chest angiogram, 11/16/2020, CT abdomen pelvis 12/01/2015 FINDINGS: CT CHEST FINDINGS Cardiovascular: Aortic atherosclerosis normal heart size. No  pericardial effusion. Mediastinum/Nodes: No enlarged mediastinal, hilar, or axillary lymph nodes. Thyroid gland, trachea, and esophagus demonstrate no significant findings. Lungs/Pleura: Minimal centrilobular and paraseptal. Dependent bibasilar atelectasis and/or consolidation, with underlying elements of bandlike scarring and architectural distortion. No pleural effusion or pneumothorax. Musculoskeletal: No chest wall mass or suspicious bone lesions identified. CT ABDOMEN PELVIS FINDINGS Hepatobiliary: No solid liver abnormality is seen. Hepatic steatosis. No gallstones, gallbladder wall thickening, or biliary dilatation. Pancreas: Unremarkable. No pancreatic ductal dilatation or surrounding inflammatory changes. Spleen: Normal in size without significant abnormality. Adrenals/Urinary Tract: Adrenal glands are unremarkable. Kidneys are normal, without renal calculi, solid lesion, or hydronephrosis. Bladder is unremarkable. Stomach/Bowel: Stomach is within normal limits. Appendix appears normal. No evidence of bowel wall thickening, distention, or inflammatory changes. Sigmoid diverticula. Vascular/Lymphatic: Aortic atherosclerosis. No enlarged abdominal or pelvic lymph nodes. Reproductive: No mass or other abnormality. Other: No abdominal wall hernia or abnormality. No abdominopelvic ascites. Musculoskeletal: No acute or significant osseous findings. IMPRESSION: 1. No CT evidence of acute traumatic injury to the chest, abdomen, or pelvis. 2. Dependent bibasilar atelectasis and/or consolidation, with underlying elements of bandlike scarring and architectural distortion, findings concerning for infection or aspiration superimposed on chronic scarring. Aortic Atherosclerosis (ICD10-I70.0). Electronically Signed   By: Delanna Ahmadi M.D.   On: 03/07/2021 11:14   DG Chest Port 1 View  Result Date: 03/07/2021 CLINICAL DATA:  Reason for exam: questionable sepsis. Pt has productive cough and congestion cough,  congestion, question sepsis EXAM: PORTABLE CHEST - 1 VIEW COMPARISON:  03/05/2021 FINDINGS: Low lung volumes. Crowding of bronchovascular structures at the lung bases. No confluent airspace disease. Heart size upper limits normal. Aortic Atherosclerosis (ICD10-170.0). No definite effusion.  No pneumothorax. Left shoulder DJD. IMPRESSION: Low volumes.  No acute disease. Electronically Signed   By: Lucrezia Europe M.D.   On: 03/07/2021 10:13     The results of significant diagnostics from this hospitalization (including imaging, microbiology, ancillary and laboratory) are listed below for reference.     Microbiology: Recent Results (from the past 240 hour(s))  Blood Culture (routine x 2)     Status: None   Collection Time: 03/07/21  9:51 AM   Specimen: BLOOD  Result Value Ref Range Status   Specimen Description BLOOD SITE NOT SPECIFIED  Final   Special Requests   Final    BOTTLES DRAWN AEROBIC AND ANAEROBIC Blood Culture results may not be optimal due to an excessive volume of blood received in culture bottles   Culture   Final  NO GROWTH 5 DAYS Performed at [facility], [place] [address], [place], [place] [address]    Report Status 03/12/2021 FINAL  Final  Blood Culture (routine x 2)     Status: None   Collection Time: 03/07/21  9:57 AM   Specimen: BLOOD  Result Value Ref Range Status   Specimen Description BLOOD SITE NOT SPECIFIED  Final   Special Requests   Final    BOTTLES DRAWN AEROBIC AND ANAEROBIC Blood Culture results may not be optimal due to an excessive volume of blood received in culture bottles   Culture   Final    NO GROWTH 5 DAYS Performed at [facility], [place] [address], [place], [place] [address]    Report Status 03/12/2021 FINAL  Final  Resp Panel by RT-PCR (Flu A&B, Covid) Nasopharyngeal Swab     Status: None   Collection Time: 03/07/21 11:05 AM   Specimen: Nasopharyngeal Swab; Nasopharyngeal(NP) swabs in vial transport medium  Result Value Ref Range Status    SARS Coronavirus 2 by RT PCR NEGATIVE NEGATIVE Final    Comment: (NOTE) SARS-CoV-2 target nucleic acids are NOT DETECTED.  The SARS-CoV-2 RNA is generally detectable in upper respiratory specimens during the acute phase of infection. The lowest concentration of SARS-CoV-2 viral copies this assay can detect is 138 copies/mL. A negative result does not preclude SARS-Cov-2 infection and should not be used as the sole basis for treatment or other patient management decisions. A negative result may occur with  improper specimen collection/handling, submission of specimen other than nasopharyngeal swab, presence of viral mutation(s) within the areas targeted by this assay, and inadequate number of viral copies(<138 copies/mL). A negative result must be combined with clinical observations, patient history, and epidemiological information. The expected result is Negative.  Fact Sheet for Patients:  EntrepreneurPulse.com.au  Fact Sheet for Healthcare Providers:  IncredibleEmployment.be  This test is no t yet approved or cleared by the [place] FDA and  has been authorized for detection and/or diagnosis of SARS-CoV-2 by FDA under an Emergency Use Authorization (EUA). This EUA will remain  in effect (meaning this test can be used) for the duration of the COVID-19 declaration under Section 564(b)(1) of the Act, 21 U.S.C.section 360bbb-3(b)(1), unless the authorization is terminated  or revoked sooner.       Influenza A by PCR NEGATIVE NEGATIVE Final   Influenza B by PCR NEGATIVE NEGATIVE Final    Comment: (NOTE) The Xpert Xpress SARS-CoV-2/FLU/RSV plus assay is intended as an aid in the diagnosis of influenza from Nasopharyngeal swab specimens and should not be used as a sole basis for treatment. Nasal washings and aspirates are unacceptable for Xpert Xpress SARS-CoV-2/FLU/RSV testing.  Fact Sheet for  Patients: EntrepreneurPulse.com.au  Fact Sheet for Healthcare Providers: IncredibleEmployment.be  This test is not yet approved or cleared by the [place] FDA and has been authorized for detection and/or diagnosis of SARS-CoV-2 by FDA under an Emergency Use Authorization (EUA). This EUA will remain in effect (meaning this test can be used) for the duration of the COVID-19 declaration under Section 564(b)(1) of the Act, 21 U.S.C. section 360bbb-3(b)(1), unless the authorization is terminated or revoked.  Performed at [facility], [place] [address] [place], [place] [address]   Expectorated Sputum Assessment w Gram Stain, Rflx to Resp Cult     Status: None   Collection Time: 03/08/21 10:25 AM   Specimen: Expectorated Sputum  Result Value Ref Range Status   Specimen Description EXPECTORATED  SPUTUM  Final   Special Requests NONE  Final   Sputum evaluation   Final    THIS SPECIMEN IS ACCEPTABLE FOR SPUTUM CULTURE Performed at [facility], [address] [place], [place] [address]    Report Status 03/10/2021 FINAL  Final  Culture, Respiratory w Gram Stain     Status: None   Collection Time: 03/08/21 10:25 AM  Result Value Ref Range Status   Specimen Description EXPECTORATED SPUTUM  Final   Special Requests NONE Reflexed from [id number]  Final   Gram Stain   Final    FEW SQUAMOUS EPITHELIAL CELLS PRESENT MODERATE WBC SEEN NO ORGANISMS SEEN    Culture   Final    RARE Consistent with normal respiratory flora. NO STAPHYLOCOCCUS AUREUS ISOLATED Performed at [facility], [place] [address] [place], [place] [address]    Report Status 03/11/2021 FINAL  Final  Resp Panel by RT-PCR (Flu A&B, Covid) Nasopharyngeal Swab     Status: Abnormal   Collection Time: 03/12/21  3:01 PM   Specimen: Nasopharyngeal Swab; Nasopharyngeal(NP) swabs in vial transport medium  Result Value Ref Range Status   SARS Coronavirus 2 by RT PCR POSITIVE (A)  NEGATIVE Final    Comment: RESULT CALLED TO, READ BACK BY AND VERIFIED WITH:  NICHOLA GAUNTLETT 1707 ADL  (NOTE) SARS-CoV-2 target nucleic acids are DETECTED.  The SARS-CoV-2 RNA is generally detectable in upper respiratory specimens during the acute phase of infection. Positive results are indicative of the presence of the identified virus, but do not rule out bacterial infection or co-infection with other pathogens not detected by the test. Clinical correlation with patient history and other diagnostic information is necessary to determine patient infection status. The expected result is Negative.  Fact Sheet for Patients: EntrepreneurPulse.com.au  Fact Sheet for Healthcare Providers: IncredibleEmployment.be  This test is not yet approved or cleared by the [place] FDA and  has been authorized for detection and/or diagnosis of SARS-CoV-2 by FDA under an Emergency Use Authorization (EUA).  This EUA will remain in effect (meaning this test can be used ) for the duration of  the COVID-19 declaration under Section 564(b)(1) of the Act, 21 U.S.C. section 360bbb-3(b)(1), unless the authorization is terminated or revoked sooner.     Influenza A by PCR NEGATIVE NEGATIVE Final   Influenza B by PCR NEGATIVE NEGATIVE Final    Comment: (NOTE) The Xpert Xpress SARS-CoV-2/FLU/RSV plus assay is intended as an aid in the diagnosis of influenza from Nasopharyngeal swab specimens and should not be used as a sole basis for treatment. Nasal washings and aspirates are unacceptable for Xpert Xpress SARS-CoV-2/FLU/RSV testing.  Fact Sheet for Patients: EntrepreneurPulse.com.au  Fact Sheet for Healthcare Providers: IncredibleEmployment.be  This test is not yet approved or cleared by the [place] FDA and has been authorized for detection and/or diagnosis of SARS-CoV-2 by FDA under an Emergency Use Authorization  (EUA). This EUA will remain in effect (meaning this test can be used) for the duration of the COVID-19 declaration under Section 564(b)(1) of the Act, 21 U.S.C. section 360bbb-3(b)(1), unless the authorization is terminated or revoked.  Performed at [facility], [address], [place], [place] [address]      Labs: BNP (last 3 results) Recent Labs    08/13/20 0150 03/15/21 0501  BNP 135.2* 62.8   Basic Metabolic Panel: Recent Labs  Lab 03/09/21 0408 03/10/21 1452 03/11/21 0155 03/13/21 1439  NA 138 131* 134* 133*  K 3.4* 3.7  4.0 4.4  CL 106 96* 99 99  CO2 25 25 25 25   GLUCOSE 120* 165* 116* 113*  BUN 7* 7* 9 6*  CREATININE 0.63 0.59 0.73 0.57  CALCIUM 8.3* 8.3* 8.5* 8.4*  MG  --   --   --  2.2   Liver Function Tests: No results for input(s): AST, ALT, ALKPHOS, BILITOT, PROT, ALBUMIN in the last 168 hours. No results for input(s): LIPASE, AMYLASE in the last 168 hours. No results for input(s): AMMONIA in the last 168 hours. CBC: Recent Labs  Lab 03/09/21 0408  WBC 7.1  NEUTROABS 5.5  HGB 11.4*  HCT 36.0  MCV 84.7  PLT 135*   Cardiac Enzymes: No results for input(s): CKTOTAL, CKMB, CKMBINDEX, TROPONINI in the last 168 hours. BNP: Invalid input(s): POCBNP CBG: Recent Labs  Lab 03/11/21 2231  GLUCAP 124*   D-Dimer No results for input(s): DDIMER in the last 72 hours. Hgb A1c No results for input(s): HGBA1C in the last 72 hours. Lipid Profile No results for input(s): CHOL, HDL, LDLCALC, TRIG, CHOLHDL, LDLDIRECT in the last 72 hours. Thyroid function studies No results for input(s): TSH, T4TOTAL, T3FREE, THYROIDAB in the last 72 hours.  Invalid input(s): FREET3 Anemia work up No results for input(s): VITAMINB12, FOLATE, FERRITIN, TIBC, IRON, RETICCTPCT in the last 72 hours. Urinalysis    Component Value Date/Time   COLORURINE YELLOW 03/07/2021 1320   APPEARANCEUR CLEAR 03/07/2021 1320   LABSPEC 1.030 03/07/2021 1320   PHURINE 6.0  03/07/2021 1320   GLUCOSEU NEGATIVE 03/07/2021 1320   HGBUR NEGATIVE 03/07/2021 1320   [place] 03/07/2021 1320   [place] 03/07/2021 1320   PROTEINUR NEGATIVE 03/07/2021 1320   NITRITE NEGATIVE 03/07/2021 1320   LEUKOCYTESUR MODERATE (A) 03/07/2021 1320   Sepsis Labs Invalid input(s): PROCALCITONIN,  WBC,  LACTICIDVEN Microbiology Recent Results (from the past 240 hour(s))  Blood Culture (routine x 2)     Status: None   Collection Time: 03/07/21  9:51 AM   Specimen: BLOOD  Result Value Ref Range Status   Specimen Description BLOOD SITE NOT SPECIFIED  Final   Special Requests   Final    BOTTLES DRAWN AEROBIC AND ANAEROBIC Blood Culture results may not be optimal due to an excessive volume of blood received in culture bottles   Culture   Final    NO GROWTH 5 DAYS Performed at [facility], [place] [address], [place], [place] [address]    Report Status 03/12/2021 FINAL  Final  Blood Culture (routine x 2)     Status: None   Collection Time: 03/07/21  9:57 AM   Specimen: BLOOD  Result Value Ref Range Status   Specimen Description BLOOD SITE NOT SPECIFIED  Final   Special Requests   Final    BOTTLES DRAWN AEROBIC AND ANAEROBIC Blood Culture results may not be optimal due to an excessive volume of blood received in culture bottles   Culture   Final    NO GROWTH 5 DAYS Performed at [facility], [place] [address], [place], [place] [address]    Report Status 03/12/2021 FINAL  Final  Resp Panel by RT-PCR (Flu A&B, Covid) Nasopharyngeal Swab     Status: None   Collection Time: 03/07/21 11:05 AM   Specimen: Nasopharyngeal Swab; Nasopharyngeal(NP) swabs in vial transport medium  Result Value Ref Range Status   SARS Coronavirus 2 by RT PCR NEGATIVE NEGATIVE Final    Comment: (NOTE) SARS-CoV-2 target nucleic acids are NOT DETECTED.  The SARS-CoV-2 RNA is generally detectable in upper respiratory specimens during the acute phase of infection. The  lowest concentration of SARS-CoV-2 viral copies this assay can detect is 138 copies/mL. A negative result does not preclude SARS-Cov-2 infection and should not be used as the sole basis for treatment or other patient management decisions. A negative result may occur with  improper specimen collection/handling, submission of specimen other than nasopharyngeal swab, presence of viral mutation(s) within the areas targeted by this assay, and inadequate number of viral copies(<138 copies/mL). A negative result must be combined with clinical observations, patient history, and epidemiological information. The expected result is Negative.  Fact Sheet for Patients:  EntrepreneurPulse.com.au  Fact Sheet for Healthcare Providers:  IncredibleEmployment.be  This test is no t yet approved or cleared by the [place] FDA and  has been authorized for detection and/or diagnosis of SARS-CoV-2 by FDA under an Emergency Use Authorization (EUA). This EUA will remain  in effect (meaning this test can be used) for the duration of the COVID-19 declaration under Section 564(b)(1) of the Act, 21 U.S.C.section 360bbb-3(b)(1), unless the authorization is terminated  or revoked sooner.       Influenza A by PCR NEGATIVE NEGATIVE Final   Influenza B by PCR NEGATIVE NEGATIVE Final    Comment: (NOTE) The Xpert Xpress SARS-CoV-2/FLU/RSV plus assay is intended as an aid in the diagnosis of influenza from Nasopharyngeal swab specimens and should not be used as a sole basis for treatment. Nasal washings and aspirates are unacceptable for Xpert Xpress SARS-CoV-2/FLU/RSV testing.  Fact Sheet for Patients: EntrepreneurPulse.com.au  Fact Sheet for Healthcare Providers: IncredibleEmployment.be  This test is not yet approved or cleared by the [place] FDA and has been authorized for detection and/or diagnosis of SARS-CoV-2 by FDA under  an Emergency Use Authorization (EUA). This EUA will remain in effect (meaning this test can be used) for the duration of the COVID-19 declaration under Section 564(b)(1) of the Act, 21 U.S.C. section 360bbb-3(b)(1), unless the authorization is terminated or revoked.  Performed at [facility], [place] [address] [place], [place] [address]   Expectorated Sputum Assessment w Gram Stain, Rflx to Resp Cult     Status: None   Collection Time: 03/08/21 10:25 AM   Specimen: Expectorated Sputum  Result Value Ref Range Status   Specimen Description EXPECTORATED SPUTUM  Final   Special Requests NONE  Final   Sputum evaluation   Final    THIS SPECIMEN IS ACCEPTABLE FOR SPUTUM CULTURE Performed at [facility], [place] [address], [place], [place] [address]    Report Status 03/10/2021 FINAL  Final  Culture, Respiratory w Gram Stain     Status: None   Collection Time: 03/08/21 10:25 AM  Result Value Ref Range Status   Specimen Description EXPECTORATED SPUTUM  Final   Special Requests NONE Reflexed from [id number]  Final   Gram Stain   Final    FEW SQUAMOUS EPITHELIAL CELLS PRESENT MODERATE WBC SEEN NO ORGANISMS SEEN    Culture   Final    RARE Consistent with normal respiratory flora. NO STAPHYLOCOCCUS AUREUS ISOLATED Performed at [facility], [place] [address] [place],  [address]    Report Status 03/11/2021 FINAL  Final  Resp Panel by RT-PCR (Flu A&B, Covid) Nasopharyngeal Swab     Status: Abnormal   Collection Time: 03/12/21  3:01 PM   Specimen: Nasopharyngeal Swab; Nasopharyngeal(NP) swabs in vial transport medium  Result Value Ref Range Status  SARS Coronavirus 2 by RT PCR POSITIVE (A) NEGATIVE Final    Comment: RESULT CALLED TO, READ BACK BY AND VERIFIED WITH:  NICHOLA GAUNTLETT 1707 ADL  (NOTE) SARS-CoV-2 target nucleic acids are DETECTED.  The SARS-CoV-2 RNA is generally detectable in upper respiratory specimens during the acute phase of infection. Positive  results are indicative of the presence of the identified virus, but do not rule out bacterial infection or co-infection with other pathogens not detected by the test. Clinical correlation with patient history and other diagnostic information is necessary to determine patient infection status. The expected result is Negative.  Fact Sheet for Patients: EntrepreneurPulse.com.au  Fact Sheet for Healthcare Providers: IncredibleEmployment.be  This test is not yet approved or cleared by the [place] FDA and  has been authorized for detection and/or diagnosis of SARS-CoV-2 by FDA under an Emergency Use Authorization (EUA).  This EUA will remain in effect (meaning this test can be used ) for the duration of  the COVID-19 declaration under Section 564(b)(1) of the Act, 21 U.S.C. section 360bbb-3(b)(1), unless the authorization is terminated or revoked sooner.     Influenza A by PCR NEGATIVE NEGATIVE Final   Influenza B by PCR NEGATIVE NEGATIVE Final    Comment: (NOTE) The Xpert Xpress SARS-CoV-2/FLU/RSV plus assay is intended as an aid in the diagnosis of influenza from Nasopharyngeal swab specimens and should not be used as a sole basis for treatment. Nasal washings and aspirates are unacceptable for Xpert Xpress SARS-CoV-2/FLU/RSV testing.  Fact Sheet for Patients: EntrepreneurPulse.com.au  Fact Sheet for Healthcare Providers: IncredibleEmployment.be  This test is not yet approved or cleared by the [place] FDA and has been authorized for detection and/or diagnosis of SARS-CoV-2 by FDA under an Emergency Use Authorization (EUA). This EUA will remain in effect (meaning this test can be used) for the duration of the COVID-19 declaration under Section 564(b)(1) of the Act, 21 U.S.C. section 360bbb-3(b)(1), unless the authorization is terminated or revoked.  Performed at [facility], [place]  [address] [place], [place] [address]      Time coordinating discharge:  I have spent 35 minutes face to face with the patient and on the ward discussing the patients care, assessment, plan and disposition with other care givers. >50% of the time was devoted counseling the patient about the risks and benefits of treatment/Discharge disposition and coordinating care.   SIGNED:   Damita Lack, MD  [facility] 03/15/2021, 10:24 AM   If 7PM-7AM, please contact night-coverage

## 2021-03-15 NOTE — TOC Transition Note (Signed)
Transition of Care [facility]) - CM/SW Discharge Note *Discharged to [facility] *Room 111 *Number for Report: [phone]   Patient Details  Name: [name] Molina MRN: [id number] Date of Birth: [birth year]  Transition of Care [facility]) CM/SW Contact:  Sable Feil, LCSW Phone Number: 03/15/2021, 11:17 AM   Clinical Narrative:  Patient medically stable for discharge and going to [facility] for [place] rehab. Discharge clinicals transmitted to facility and nurse provided with information to call report. Patient will be transported by non-emergency ambulance transport. Daughter [name] [phone]) advised that she will be contacted once transport arranged.      Final next level of care: [place]) Barriers to Discharge: Barriers Resolved   Patient Goals and CMS Choice Patient states their goals for this hospitalization and ongoing recovery are:: Patient wants to go home after ST rehab CMS Medicare.gov Compare Post Acute Care list provided to:: Patient Represenative (must comment) (Daughter advised regarding Medicare,gov) Choice offered to / list presented to : Patient, Adult Children  Discharge Placement PASRR number recieved: 03/12/21 ([id number] E - Effective 10/24 - 04/11/21)            Patient chooses bed at: [facility] Patient to be transferred to facility by: Non-emergency ambulance transport Name of family member notified: Daughter [name] - [phone] Patient and family notified of of transfer: 03/15/21  Discharge Plan and Services   Discharge Planning Services: CM Consult                                 Social Determinants of Health (SDOH) Interventions  No SDOH interventions requested or needed at discharge   Readmission Risk Interventions No flowsheet data found.

## 2021-03-15 NOTE — Progress Notes (Signed)
NURSING PROGRESS NOTE  [name] Molina [id number] Discharge Data: 03/15/2021 Reported called to SNF @ 1308 Attending Provider: Damita Lack, MD BHA:LPFXT, Caren Griffins, MD     [name] to be D/C'd Skilled nursing facility per MD order.  Discussed with the patient the After Visit Summary and all questions fully answered. All IV's discontinued with no bleeding noted. All belongings returned to patient for patient to take home.   Last Vital Signs:  Blood pressure 123/63, pulse (!) 106, temperature 98 F (36.7 C), temperature source Oral, resp. rate 20, height 5\' 2"  (1.575 m), weight 96.4 kg, SpO2 99 %.  Discharge Medication List Allergies as of 03/15/2021       Reactions   Food Shortness Of Breath   ALLERGY= MUSHROOMS   Levaquin [levofloxacin In D5w] Other (See Comments)   "made me want to die."   Tequin Shortness Of Breath   Erythromycin Other (See Comments)   GI UPSET   Oxycodone-acetaminophen Itching   Codeine Nausea And Vomiting   Penicillins Rash, Other (See Comments)   Reaction occurred during childhood   Prednisone Rash   Wellbutrin [bupropion Hcl] Other (See Comments)   SHAKING        Medication List     TAKE these medications    acetaminophen 650 MG CR tablet Commonly known as: TYLENOL Take 1,300 mg by mouth every 8 (eight) hours as needed for pain.   albuterol 108 (90 Base) MCG/ACT inhaler Commonly known as: VENTOLIN HFA Inhale 2 puffs into the lungs every 6 (six) hours as needed for wheezing or shortness of breath.   alclomethasone 0.05 % cream Commonly known as: ACLOVATE Apply topically 2 (two) times daily as needed (Rash). What changed:  how much to take reasons to take this   aspirin EC 81 MG tablet Take 81 mg by mouth at bedtime. Swallow whole. Notes to patient: 03/15/2021   Besivance 0.6 % Susp Generic drug: Besifloxacin HCl Place 1 drop into the left eye See admin instructions. Instill 1 drop into the left eye 4 times daily the day OF and  day AFTER (eye injection) Notes to patient: 03/16/2021   cetirizine 10 MG tablet Commonly known as: ZYRTEC TAKE ONE TABLET BY MOUTH ONCE DAILY What changed: when to take this Notes to patient: 03/16/2021   diltiazem 240 MG 24 hr capsule Commonly known as: CARDIZEM CD Take 1 capsule (240 mg total) by mouth daily. Notes to patient: 03/16/2021   DULoxetine 60 MG capsule Commonly known as: CYMBALTA Take 60 mg by mouth every morning. Notes to patient: 03/16/2021   Eliquis 5 MG Tabs tablet Generic drug: apixaban Take 5 mg by mouth 2 (two) times daily. Notes to patient: 03/15/2021   flecainide 50 MG tablet Commonly known as: TAMBOCOR Take 1 tablet (50 mg total) by mouth every 12 (twelve) hours. Notes to patient: 03/15/2021   fluticasone 50 MCG/ACT nasal spray Commonly known as: FLONASE Place 1 spray into both nostrils daily. What changed:  when to take this reasons to take this Notes to patient: 03/16/2021   furosemide 40 MG tablet Commonly known as: LASIX Take 1 tablet (40 mg total) by mouth daily. Notes to patient: 03/16/2021   ipratropium-albuterol 0.5-2.5 (3) MG/3ML Soln Commonly known as: DUONEB INHALE contents of one vial using nebulizer TWICE DAILY What changed: See the new instructions. Notes to patient: 03/15/2021   LORazepam 0.5 MG tablet Commonly known as: ATIVAN Take 1-2 tablets (0.5-1 mg total) by mouth daily as needed for anxiety. Anxiety  metoprolol succinate 50 MG 24 hr tablet Commonly known as: TOPROL-XL TAKE ONE TABLET BY MOUTH EVERYDAY AT BEDTIME What changed: See the new instructions. Notes to patient: 03/15/2021   montelukast 10 MG tablet Commonly known as: SINGULAIR Take 10 mg by mouth at bedtime. Notes to patient: 03/15/2021   mupirocin ointment 2 % Commonly known as: BACTROBAN Apply 1 application topically 2 (two) times daily. What changed:  when to take this reasons to take this Notes to patient: 03/15/2021   omeprazole 20 MG  tablet Commonly known as: PRILOSEC OTC Take 20 mg by mouth at bedtime. Notes to patient: 03/15/2021   ondansetron 4 MG tablet Commonly known as: Zofran Take 1 tablet (4 mg total) by mouth every 8 (eight) hours as needed for nausea or vomiting.   pravastatin 40 MG tablet Commonly known as: PRAVACHOL Take 40 mg by mouth at bedtime. Notes to patient: 03/15/2021   predniSONE 20 MG tablet Commonly known as: DELTASONE Take 1 tablet (20 mg total) by mouth daily with breakfast. Notes to patient: 03/15/2021   PreserVision AREDS 2+Multi Vit Caps Take 1 tablet by mouth 2 (two) times daily. Notes to patient: 03/15/2021   promethazine-dextromethorphan 6.25-15 MG/5ML syrup Commonly known as: PROMETHAZINE-DM Take 5 mLs by mouth at bedtime. Notes to patient: 03/15/2021   Trelegy Ellipta 100-62.5-25 MCG/ACT Aepb Generic drug: Fluticasone-Umeclidin-Vilant INHALE 1 PUFF BY MOUTH EVERY DAY What changed: See the new instructions. Notes to patient: 03/16/2021   VITAMIN B-12 PO Take 2,500 mcg by mouth daily. Notes to patient: 03/16/2021

## 2021-03-21 ENCOUNTER — Ambulatory Visit: Payer: Medicare Other | Admitting: Emergency Medicine

## 2021-04-03 ENCOUNTER — Other Ambulatory Visit: Payer: Self-pay | Admitting: Cardiology

## 2021-04-03 NOTE — Telephone Encounter (Signed)
Pt last saw Dr Curt Bears 03/01/21, last labs 03/13/21 Creat 0.57, age 75, weight 96.4kg, based on specified criteria pt is on appropriate dosage of Eliquis 5mg  BID for afib.  Will refill rx.

## 2021-04-06 DIAGNOSIS — E46 Unspecified protein-calorie malnutrition: Secondary | ICD-10-CM | POA: Diagnosis not present

## 2021-04-06 DIAGNOSIS — D519 Vitamin B12 deficiency anemia, unspecified: Secondary | ICD-10-CM | POA: Diagnosis not present

## 2021-04-06 DIAGNOSIS — J449 Chronic obstructive pulmonary disease, unspecified: Secondary | ICD-10-CM | POA: Diagnosis not present

## 2021-04-06 DIAGNOSIS — G8929 Other chronic pain: Secondary | ICD-10-CM | POA: Diagnosis not present

## 2021-04-09 ENCOUNTER — Other Ambulatory Visit: Payer: Self-pay | Admitting: *Deleted

## 2021-04-09 NOTE — Patient Outreach (Signed)
[name] Molina resides in [facility]. Screened for potential [facility] Care Management needs.   Update received from [place] [place] [facility] SW indicating [name] Molina is scheduled to transition home on this Wednesday with [facility] home health for PT/OT/RN/aide services.   Will plan outreach to discuss [place] Management follow up.   [name] Rolling, MSN, RN,BSN [place] Acute Care Coordinator [phone] [facility]) [phone]  (Toll free office)

## 2021-04-10 ENCOUNTER — Other Ambulatory Visit: Payer: Self-pay | Admitting: *Deleted

## 2021-04-10 ENCOUNTER — Encounter (INDEPENDENT_AMBULATORY_CARE_PROVIDER_SITE_OTHER): Payer: Medicare Other | Admitting: Ophthalmology

## 2021-04-10 DIAGNOSIS — I10 Essential (primary) hypertension: Secondary | ICD-10-CM | POA: Diagnosis not present

## 2021-04-10 DIAGNOSIS — I480 Paroxysmal atrial fibrillation: Secondary | ICD-10-CM | POA: Diagnosis not present

## 2021-04-10 DIAGNOSIS — M48061 Spinal stenosis, lumbar region without neurogenic claudication: Secondary | ICD-10-CM | POA: Diagnosis not present

## 2021-04-10 DIAGNOSIS — M6281 Muscle weakness (generalized): Secondary | ICD-10-CM | POA: Diagnosis not present

## 2021-04-10 DIAGNOSIS — J449 Chronic obstructive pulmonary disease, unspecified: Secondary | ICD-10-CM | POA: Diagnosis not present

## 2021-04-10 DIAGNOSIS — I5032 Chronic diastolic (congestive) heart failure: Secondary | ICD-10-CM | POA: Diagnosis not present

## 2021-04-10 DIAGNOSIS — J9621 Acute and chronic respiratory failure with hypoxia: Secondary | ICD-10-CM | POA: Diagnosis not present

## 2021-04-10 DIAGNOSIS — F411 Generalized anxiety disorder: Secondary | ICD-10-CM | POA: Diagnosis not present

## 2021-04-10 NOTE — Patient Outreach (Signed)
[place] Coordinator follow up. Ms. Kage resides in [facility].  Member screened for potential [facility] Care Management needs.   [place] previously indicated Ms. Boxley will return home on Wednesday, Nov. 23rd.   Telephone call made to Ms. Pack [phone] to discuss [place] Management services. No answer. Phone just rang.   Telephone call made to daughter/ DPR [name] [phone]. Patient identifiers confirmed. Sharyn Lull states Ms. Fors lives with her. Confirms Ms. Cogan will transition to home from [facility].  Explained [place] Management services. Sharyn Lull is agreeable. Sharyn Lull states she should be contacted post SNF. States Ms. Morrical can be forgetful at times. Explained [facility] Care Management will not replace or interfere services provided by home health.   Ms. Fiske has medical history of recent COVID, COPD, asthma, chronic respiratory failure, diastolic congestive heart failure, paroxysmal atrial fibrillation.   Ms. Debell will have [facility] home health.  Will make referral for [place] for care coordination.   Marthenia Rolling, MSN, RN,BSN [place] Acute Care Coordinator [phone] [facility]) [phone]  (Toll free office)

## 2021-04-16 ENCOUNTER — Other Ambulatory Visit: Payer: Self-pay | Admitting: *Deleted

## 2021-04-16 DIAGNOSIS — J9611 Chronic respiratory failure with hypoxia: Secondary | ICD-10-CM | POA: Diagnosis not present

## 2021-04-16 DIAGNOSIS — E538 Deficiency of other specified B group vitamins: Secondary | ICD-10-CM | POA: Diagnosis not present

## 2021-04-16 DIAGNOSIS — E785 Hyperlipidemia, unspecified: Secondary | ICD-10-CM | POA: Diagnosis not present

## 2021-04-16 DIAGNOSIS — I10 Essential (primary) hypertension: Secondary | ICD-10-CM | POA: Diagnosis not present

## 2021-04-16 DIAGNOSIS — J449 Chronic obstructive pulmonary disease, unspecified: Secondary | ICD-10-CM | POA: Diagnosis not present

## 2021-04-16 DIAGNOSIS — J189 Pneumonia, unspecified organism: Secondary | ICD-10-CM | POA: Diagnosis not present

## 2021-04-16 DIAGNOSIS — D509 Iron deficiency anemia, unspecified: Secondary | ICD-10-CM | POA: Diagnosis not present

## 2021-04-16 DIAGNOSIS — I480 Paroxysmal atrial fibrillation: Secondary | ICD-10-CM | POA: Diagnosis not present

## 2021-04-16 DIAGNOSIS — F331 Major depressive disorder, recurrent, moderate: Secondary | ICD-10-CM | POA: Diagnosis not present

## 2021-04-16 NOTE — Patient Outreach (Signed)
[place] [facility]) Care Management  04/16/2021  LETICA GIAIMO [birth year] [id number]  Referral Received 11/23-SNF discharge Transition of Care Initial Outreach   RN attempted outreach today however unsuccessful. RN able to leave a HIPAA approved voice message requesting a call back.  Will reach out once again over the next week for pending [facility] services. Will also send outreach letter.  Raina Mina, RN Care Management Coordinator [place] Office [phone]

## 2021-04-17 DIAGNOSIS — Z7952 Long term (current) use of systemic steroids: Secondary | ICD-10-CM | POA: Diagnosis not present

## 2021-04-17 DIAGNOSIS — I959 Hypotension, unspecified: Secondary | ICD-10-CM | POA: Diagnosis not present

## 2021-04-17 DIAGNOSIS — D519 Vitamin B12 deficiency anemia, unspecified: Secondary | ICD-10-CM | POA: Diagnosis not present

## 2021-04-17 DIAGNOSIS — Z8701 Personal history of pneumonia (recurrent): Secondary | ICD-10-CM | POA: Diagnosis not present

## 2021-04-17 DIAGNOSIS — J309 Allergic rhinitis, unspecified: Secondary | ICD-10-CM | POA: Diagnosis not present

## 2021-04-17 DIAGNOSIS — J9621 Acute and chronic respiratory failure with hypoxia: Secondary | ICD-10-CM | POA: Diagnosis not present

## 2021-04-17 DIAGNOSIS — I480 Paroxysmal atrial fibrillation: Secondary | ICD-10-CM | POA: Diagnosis not present

## 2021-04-17 DIAGNOSIS — F411 Generalized anxiety disorder: Secondary | ICD-10-CM | POA: Diagnosis not present

## 2021-04-17 DIAGNOSIS — G609 Hereditary and idiopathic neuropathy, unspecified: Secondary | ICD-10-CM | POA: Diagnosis not present

## 2021-04-17 DIAGNOSIS — M48061 Spinal stenosis, lumbar region without neurogenic claudication: Secondary | ICD-10-CM | POA: Diagnosis not present

## 2021-04-17 DIAGNOSIS — J449 Chronic obstructive pulmonary disease, unspecified: Secondary | ICD-10-CM | POA: Diagnosis not present

## 2021-04-17 DIAGNOSIS — Z9981 Dependence on supplemental oxygen: Secondary | ICD-10-CM | POA: Diagnosis not present

## 2021-04-17 DIAGNOSIS — F329 Major depressive disorder, single episode, unspecified: Secondary | ICD-10-CM | POA: Diagnosis not present

## 2021-04-17 DIAGNOSIS — Z7901 Long term (current) use of anticoagulants: Secondary | ICD-10-CM | POA: Diagnosis not present

## 2021-04-17 DIAGNOSIS — Z9181 History of falling: Secondary | ICD-10-CM | POA: Diagnosis not present

## 2021-04-17 DIAGNOSIS — Z8616 Personal history of COVID-19: Secondary | ICD-10-CM | POA: Diagnosis not present

## 2021-04-17 DIAGNOSIS — I5032 Chronic diastolic (congestive) heart failure: Secondary | ICD-10-CM | POA: Diagnosis not present

## 2021-04-17 DIAGNOSIS — K219 Gastro-esophageal reflux disease without esophagitis: Secondary | ICD-10-CM | POA: Diagnosis not present

## 2021-04-17 DIAGNOSIS — E46 Unspecified protein-calorie malnutrition: Secondary | ICD-10-CM | POA: Diagnosis not present

## 2021-04-17 DIAGNOSIS — E782 Mixed hyperlipidemia: Secondary | ICD-10-CM | POA: Diagnosis not present

## 2021-04-17 DIAGNOSIS — Z7982 Long term (current) use of aspirin: Secondary | ICD-10-CM | POA: Diagnosis not present

## 2021-04-18 ENCOUNTER — Other Ambulatory Visit: Payer: Self-pay | Admitting: *Deleted

## 2021-04-18 NOTE — Patient Outreach (Addendum)
[place] [facility]) Care Management  04/18/2021  NOVI CALIA 30-Jan-1946 [id number]   Transition of Care-Unsuccessful  RN attempted outreach call today however unsuccessful. RN able to leave a HIPAA approved voice message requesting a call back.  Will attempt another outreach over the next week.  Raina Mina, RN Care Management Coordinator [place] Office [phone]

## 2021-04-23 ENCOUNTER — Encounter: Payer: Self-pay | Admitting: *Deleted

## 2021-04-23 ENCOUNTER — Other Ambulatory Visit: Payer: Self-pay | Admitting: *Deleted

## 2021-04-23 DIAGNOSIS — I509 Heart failure, unspecified: Secondary | ICD-10-CM

## 2021-04-23 NOTE — Patient Instructions (Signed)
Visit Information  Thank you for taking time to visit with me today. Please don't hesitate to contact me if I can be of assistance to you before our next scheduled telephone appointment.  Following are the goals we discussed today:    Following is a copy of your care plan:  Care Plan : RN Care Manager plan of care  Updates made by Tobi Bastos, RN since 04/23/2021 12:00 AM     Problem: Knowledge deficit related to falls/weakness/vertigo and care coordination needs   Priority: High     Long-Range Goal: Development plan of care for management of Falls/Weakness   Start Date: 04/23/2021  Priority: High  Note:   Current Barriers:  Knowledge Deficits related to plan of care for management of Weakness/Falls prevention/Vertigo   RNCM Clinical Goal(s):  Patient will Knowledge Deficits related to plan of care for management of Weakness/Falls prevention/Vertigo through collaboration with RN Care manager, provider, and care team.   Interventions: Inter-disciplinary care team collaboration (see longitudinal plan of care) Evaluation of current treatment plan related to  self management and patient's adherence to plan as established by provider   Health Maintenance Interventions:  (Status:  New goal.) Long Term Goal Knowledge Deficits related to plan of care for management of Weakness/Falls prevention/Vertigo   Patient Goals/Self-Care Activities: Take all medications as prescribed Attend all scheduled provider appointments Call pharmacy for medication refills 3-7 days in advance of running out of medications Call provider office for new concerns or questions   Follow Up Plan:  Telephone follow up appointment with care management team member scheduled for:  Jan 2023 The patient has been provided with contact information for the care management team and has been advised to call with any health related questions or concerns.         SIGNATURE  Raina Mina, RN Care Management  Coordinator [place] Office [phone]

## 2021-04-23 NOTE — Patient Outreach (Signed)
[facility]) Care Management Telephonic RN Care Manager Note  04/23/2021  KARYSA HEFT [birth year] [id number]   04/23/2021 Name:  [name] Molina MRN:  [id number] DOB:  1946/04/24   Subjective: [name] Molina is an 75 y.o. year old female who is a primary patient of Harlan Stains, MD. The care management team was consulted for assistance with care management and/or care coordination needs.  Discussed her recent discharge from the SNF and current involvement with [facility] for HHPT. Explained [facility] services and offered to further engage with weekly transition of care calls. Pt reports she has already followed up with her provider and only agreed to monthly follow up call of inquires. Offered to focus on any other medical issues however discussed adherence with all her medications and attendance to her medical appointments. Pt request her focus on her focus on her ongoing weakness, history of falls and vertigo. Pt was enrolled into the program and services.   Telephonic RN Care Manager completed telephone visit today.   Objective:  Medications Reviewed Today     Reviewed by Tobi Bastos, RN (Registered Nurse) on 04/23/21 at 1342  Med List Status: <None>   Medication Order Taking? Sig Documenting Provider Last Dose Status Informant  acetaminophen (TYLENOL) 650 MG CR tablet [id number] Yes Take 1,300 mg by mouth every 8 (eight) hours as needed for pain. [provider] Taking Active Family Member  albuterol (VENTOLIN HFA) 108 (90 Base) MCG/ACT inhaler [id number] Yes Inhale 2 puffs into the lungs every 6 (six) hours as needed for wheezing or shortness of breath. Parrett, Fonnie Mu, NP Taking Active Family Member  alclomethasone (ACLOVATE) 0.05 % cream [id number] No Apply topically 2 (two) times daily as needed (Rash).  Patient not taking: Reported on 04/23/2021   Buttram Danes, PA-C Not Taking Active Family Member  apixaban (ELIQUIS) 5 MG TABS tablet [id number] Yes Take 1  tablet (5 mg total) by mouth 2 (two) times daily. Constance Haw, MD Taking Active   aspirin EC 81 MG tablet [id number] No Take 81 mg by mouth at bedtime. Swallow whole.  Patient not taking: Reported on 04/23/2021   [provider] Not Taking Active Family Member  BESIVANCE 0.6 % SUSP [id number] Yes Place 1 drop into the left eye See admin instructions. Instill 1 drop into the left eye 4 times daily the day OF and day AFTER (eye injection) [provider] Taking Active Family Member  cetirizine (ZYRTEC) 10 MG tablet [id number] Yes TAKE ONE TABLET BY MOUTH ONCE DAILY  Patient taking differently: Take 10 mg by mouth in the morning.   Collene Gobble, MD Taking Active Family Member  Cyanocobalamin (VITAMIN B-12 PO) [id number] Yes Take 2,500 mcg by mouth daily. [provider] Taking Active Family Member  diltiazem (CARDIZEM CD) 240 MG 24 hr capsule [id number] Yes Take 1 capsule (240 mg total) by mouth daily. Constance Haw, MD Taking Active Family Member  DULoxetine (CYMBALTA) 60 MG capsule [id number] Yes Take 60 mg by mouth every morning. [provider] Taking Active Family Member  flecainide (TAMBOCOR) 50 MG tablet [id number] Yes Take 1 tablet (50 mg total) by mouth every 12 (twelve) hours. Constance Haw, MD Taking Active Family Member  fluticasone [facility]) 50 MCG/ACT nasal spray [id number] Yes Place 1 spray into both nostrils daily.  Patient taking differently: Place 1 spray into both nostrils daily as needed for allergies or rhinitis.   Collene Gobble, MD Taking Active Family Member  furosemide (  LASIX) 40 MG tablet [id number] Yes Take 1 tablet (40 mg total) by mouth daily. Donne Hazel, MD Taking Active Family Member  ipratropium-albuterol (DUONEB) 0.5-2.5 (3) MG/3ML Bailey Mech [id number] Yes INHALE contents of one vial using nebulizer TWICE DAILY  Patient taking differently: Take 3 mLs by nebulization 2 (two) times daily as needed (for shortness of  breath or wheezing).   Collene Gobble, MD Taking Active Family Member  LORazepam (ATIVAN) 0.5 MG tablet [id number] Yes Take 1-2 tablets (0.5-1 mg total) by mouth daily as needed for anxiety. Anxiety Amin, Jeanella Flattery, MD Taking Active   metoprolol succinate (TOPROL-XL) 50 MG 24 hr tablet [id number] Yes TAKE ONE TABLET BY MOUTH EVERYDAY AT BEDTIME  Patient taking differently: Take 50 mg by mouth at bedtime.   Jerline Pain, MD Taking Active Family Member  montelukast (SINGULAIR) 10 MG tablet [id number] Yes Take 10 mg by mouth at bedtime. [provider] Taking Active Family Member           Med Note Wyatt Portela, Everlean Cherry May 22, 2014  4:20 PM)     Multiple Vitamins-Minerals (PRESERVISION AREDS 2+MULTI VIT) CAPS [id number] Yes Take 1 tablet by mouth 2 (two) times daily. [provider] Taking Active Family Member  mupirocin ointment (BACTROBAN) 2 % [id number] Yes Apply 1 application topically 2 (two) times daily.  Patient taking differently: Apply 1 application topically 2 (two) times daily as needed (as directed).   Schellhorn Danes, PA-C Taking Active Family Member  omeprazole (PRILOSEC OTC) 20 MG tablet [id number] Yes Take 20 mg by mouth at bedtime. [provider] Taking Active Family Member  ondansetron (ZOFRAN) 4 MG tablet [id number] No Take 1 tablet (4 mg total) by mouth every 8 (eight) hours as needed for nausea or vomiting.  Patient not taking: Reported on 04/23/2021   Justice Britain, PA-C Not Taking Active Family Member  pravastatin (PRAVACHOL) 40 MG tablet [id number] Yes Take 40 mg by mouth at bedtime.  [provider] Taking Active Family Member  predniSONE (DELTASONE) 20 MG tablet [id number] No Take 1 tablet (20 mg total) by mouth daily with breakfast.  Patient not taking: Reported on 04/23/2021   Damita Lack, MD Not Taking Active   promethazine-dextromethorphan (PROMETHAZINE-DM) 6.25-15 MG/5ML syrup [id number] No Take 5 mLs by mouth at bedtime.   Patient not taking: Reported on 04/23/2021   [provider] Not Taking Active Family Member  TRELEGY ELLIPTA 100-62.5-25 [place] [id number] Yes INHALE 1 PUFF BY MOUTH EVERY DAY  Patient taking differently: Inhale 1 puff into the lungs in the morning.   Collene Gobble, MD Taking Active Family Member             SDOH:  (Social Determinants of Health) assessments and interventions performed:  SDOH Interventions    Flowsheet Row Most Recent Value  SDOH Interventions   Food Insecurity Interventions Intervention Not Indicated  Transportation Interventions Intervention Not Indicated       Care Plan  Review of patient past medical history, allergies, medications, health status as part of comprehensive evaluation for care management services.   Care Plan : RN Care Manager plan of care  Updates made by Tobi Bastos, RN since 04/23/2021 12:00 AM     Problem: Knowledge deficit related to falls/weakness/vertigo and care coordination needs   Priority: High     Long-Range Goal: Development plan of care for management of Falls/Weakness   Start Date: 04/23/2021  Priority: High  Note:  Current Barriers:  Knowledge Deficits related to plan of care for management of Weakness/Falls prevention/Vertigo   RNCM Clinical Goal(s):  Patient will Knowledge Deficits related to plan of care for management of Weakness/Falls prevention/Vertigo through collaboration with RN Care manager, provider, and care team.   Interventions: Inter-disciplinary care team collaboration (see longitudinal plan of care) Evaluation of current treatment plan related to  self management and patient's adherence to plan as established by provider   Health Maintenance Interventions:  (Status:  New goal.) Long Term Goal Knowledge Deficits related to plan of care for management of Weakness/Falls prevention/Vertigo   Patient Goals/Self-Care Activities: Take all medications as prescribed Attend all  scheduled provider appointments Call pharmacy for medication refills 3-7 days in advance of running out of medications Call provider office for new concerns or questions   Follow Up Plan:  Telephone follow up appointment with care management team member scheduled for:  Jan 2023 The patient has been provided with contact information for the care management team and has been advised to call with any health related questions or concerns.         Raina Mina, RN Care Management Coordinator [place] Office [phone]

## 2021-04-24 ENCOUNTER — Telehealth: Payer: Self-pay | Admitting: *Deleted

## 2021-04-24 NOTE — Telephone Encounter (Signed)
   Telephone encounter was:  Successful.  04/24/2021 Name: [name] Molina MRN: [id number] DOB: 1946-04-17  [name] Molina is a 75 y.o. year old female who is a primary care patient of Harlan Stains, MD . The community resource team was consulted for assistance with Transportation Needs [place] will send application for Access and the brochure for the TAMs program to patient as requested   Care guide performed the following interventions: Patient provided with information about care guide support team and interviewed to confirm resource needs Follow up call placed to community resources to determine status of patients referral.  Follow Up Plan:  No further follow up planned at this time. The patient has been provided with needed resources.  [place], Care Management  [phone] [address] , [place] [address] Email : [email or web address]

## 2021-04-24 NOTE — Telephone Encounter (Signed)
   Telephone encounter was:  Unsuccessful.  04/24/2021 Name: [name] Molina MRN: [id number] DOB: 1945-09-23  Unsuccessful outbound call made today to assist with:  Transportation Needs   Outreach Attempt:  1st Attempt  A HIPAA compliant voice message was left requesting a return call.  Instructed patient to call back at   Instructed patient to call back at [phone]  at their earliest convenience. .  [place], Care Management  [phone] [address] , [place] [address] Email : [email or web address]

## 2021-04-26 DIAGNOSIS — J9621 Acute and chronic respiratory failure with hypoxia: Secondary | ICD-10-CM | POA: Diagnosis not present

## 2021-04-26 DIAGNOSIS — I5032 Chronic diastolic (congestive) heart failure: Secondary | ICD-10-CM | POA: Diagnosis not present

## 2021-04-26 DIAGNOSIS — M48061 Spinal stenosis, lumbar region without neurogenic claudication: Secondary | ICD-10-CM | POA: Diagnosis not present

## 2021-04-26 DIAGNOSIS — I480 Paroxysmal atrial fibrillation: Secondary | ICD-10-CM | POA: Diagnosis not present

## 2021-04-26 DIAGNOSIS — I959 Hypotension, unspecified: Secondary | ICD-10-CM | POA: Diagnosis not present

## 2021-04-26 DIAGNOSIS — J449 Chronic obstructive pulmonary disease, unspecified: Secondary | ICD-10-CM | POA: Diagnosis not present

## 2021-04-30 ENCOUNTER — Encounter: Payer: Self-pay | Admitting: Cardiology

## 2021-04-30 ENCOUNTER — Ambulatory Visit (INDEPENDENT_AMBULATORY_CARE_PROVIDER_SITE_OTHER): Payer: Medicare Other | Admitting: Cardiology

## 2021-04-30 ENCOUNTER — Other Ambulatory Visit: Payer: Self-pay

## 2021-04-30 DIAGNOSIS — J438 Other emphysema: Secondary | ICD-10-CM | POA: Diagnosis not present

## 2021-04-30 DIAGNOSIS — I480 Paroxysmal atrial fibrillation: Secondary | ICD-10-CM

## 2021-04-30 DIAGNOSIS — Z7901 Long term (current) use of anticoagulants: Secondary | ICD-10-CM | POA: Diagnosis not present

## 2021-04-30 DIAGNOSIS — I5032 Chronic diastolic (congestive) heart failure: Secondary | ICD-10-CM

## 2021-04-30 NOTE — Patient Instructions (Signed)
Medication Instructions:  The current medical regimen is effective;  continue present plan and medications.  *If you need a refill on your cardiac medications before your next appointment, please call your pharmacy*  Follow-Up: At [facility], you and your health needs are our priority.  As part of our continuing mission to provide you with exceptional heart care, we have created designated Provider Care Teams.  These Care Teams include your primary Cardiologist (physician) and Advanced Practice Providers (APPs -  Physician Assistants and Nurse Practitioners) who all work together to provide you with the care you need, when you need it.  We recommend signing up for the patient portal called "MyChart".  Sign up information is provided on this After Visit Summary.  MyChart is used to connect with patients for Virtual Visits (Telemedicine).  Patients are able to view lab/test results, encounter notes, upcoming appointments, etc.  Non-urgent messages can be sent to your provider as well.   To learn more about what you can do with MyChart, go to [email or web address].    Your next appointment:   6 month(s)  The format for your next appointment:   In Person  Provider:   With any NP or PA and 1 year with Candee Furbish, MD     Thank you for choosing [facility]!!

## 2021-04-30 NOTE — Progress Notes (Signed)
Cardiology Office Note:    Date:  04/30/2021   ID:  Zalaya, Astarita 12/20/1945, MRN [id number]  PCP:  Harlan Stains, MD   [facility] Providers Cardiologist:  Candee Furbish, MD Electrophysiologist:  Will Meredith Leeds, MD     Referring MD: Harlan Stains, MD    History of Present Illness:    [name] Molina is a 75 y.o. female here for follow-up of atrial fibrillation.  Previously seen Dr. Curt Bears.  Has COPD diastolic heart failure anxiety.  Back in June 2022 was hospitalized with rapid atrial fibrillation.  She had been on Eliquis.  Flecainide was started during this hospitalization.  She has not had any further significant episodes of atrial fibrillation.  She has seen pulmonary because of prior COPD and shortness of breath especially following COVID in September 2022.  She ended up going to rehab.  She did have a fall recently.  No chest pain.  Wears oxygen usually at night.  Past Medical History:  Diagnosis Date   Anxiety    Aortic atherosclerosis (HCC)    Arthritis    Asthma    Atrial tachycardia ([place])    Cancer (HCC)    basal skin cancer on nose   Cataract    Chronic diastolic CHF (congestive heart failure) (HCC)    Complication of anesthesia    during cervical surgery she woke up during the procedure   COPD (chronic obstructive pulmonary disease) ([place])    Depression    Diverticulosis    Family history of adverse reaction to anesthesia    mother had post op N&V   GERD (gastroesophageal reflux disease)    History of hiatal hernia    Hyperlipidemia    Hypertension    Insomnia    Macular degeneration    Migraines    Neuropathy    Obesity    PAF (paroxysmal atrial fibrillation) ([place])    Pneumonia    Sleep apnea    history of it,not using a cpap, study 2018: didn't need cpap   Thyroid nodule 2000   radioactive caspule    Past Surgical History:  Procedure Laterality Date   ANTERIOR CERVICAL DECOMP/DISCECTOMY FUSION N/A 06/15/2020   Procedure: ANTERIOR  CERVICAL DECOMPRESSION FUSION CERVICAL FIVE-CERVICAL SIX WITH INSTRUMENTATION AND ALLOGRAFT;  Surgeon: Phylliss Bob, MD;  Location: [facility];  Service: Orthopedics;  Laterality: N/A;  anterior   CERVICAL POLYPECTOMY     INSIDE AND OUTSIDE   COLONOSCOPY     EYE SURGERY Bilateral    cataracts   POLYPECTOMY     REMOVED CARTLIAGE RIB      Current Medications: Current Meds  Medication Sig   acetaminophen (TYLENOL) 650 MG CR tablet Take 1,300 mg by mouth every 8 (eight) hours as needed for pain.   albuterol (VENTOLIN HFA) 108 (90 Base) MCG/ACT inhaler Inhale 2 puffs into the lungs every 6 (six) hours as needed for wheezing or shortness of breath.   apixaban (ELIQUIS) 5 MG TABS tablet Take 1 tablet (5 mg total) by mouth 2 (two) times daily.   BESIVANCE 0.6 % SUSP Place 1 drop into the left eye See admin instructions. Instill 1 drop into the left eye 4 times daily the day OF and day AFTER (eye injection)   cetirizine (ZYRTEC) 10 MG tablet TAKE ONE TABLET BY MOUTH ONCE DAILY   Cyanocobalamin (VITAMIN B-12 PO) Take 2,500 mcg by mouth daily.   diltiazem (CARDIZEM CD) 240 MG 24 hr capsule Take 1 capsule (240 mg total) by mouth  daily.   DULoxetine (CYMBALTA) 60 MG capsule Take 60 mg by mouth every morning.   flecainide (TAMBOCOR) 50 MG tablet Take 1 tablet (50 mg total) by mouth every 12 (twelve) hours.   fluticasone (FLONASE) 50 MCG/ACT nasal spray Place 1 spray into both nostrils daily.   furosemide (LASIX) 40 MG tablet Take 1 tablet (40 mg total) by mouth daily.   ipratropium-albuterol (DUONEB) 0.5-2.5 (3) MG/3ML SOLN INHALE contents of one vial using nebulizer TWICE DAILY   LORazepam (ATIVAN) 0.5 MG tablet Take 1-2 tablets (0.5-1 mg total) by mouth daily as needed for anxiety. Anxiety   metoprolol succinate (TOPROL-XL) 50 MG 24 hr tablet TAKE ONE TABLET BY MOUTH EVERYDAY AT BEDTIME   montelukast (SINGULAIR) 10 MG tablet Take 10 mg by mouth at bedtime.   Multiple Vitamins-Minerals (PRESERVISION  AREDS 2+MULTI VIT) CAPS Take 1 tablet by mouth 2 (two) times daily.   omeprazole (PRILOSEC OTC) 20 MG tablet Take 20 mg by mouth at bedtime.   pravastatin (PRAVACHOL) 40 MG tablet Take 40 mg by mouth at bedtime.    promethazine-dextromethorphan (PROMETHAZINE-DM) 6.25-15 MG/5ML syrup Take 5 mLs by mouth at bedtime.   TRELEGY ELLIPTA 100-62.5-25 MCG/INH AEPB INHALE 1 PUFF BY MOUTH EVERY DAY     Allergies:   Food, Levaquin [levofloxacin in d5w], Tequin, Erythromycin, Oxycodone-acetaminophen, Codeine, Penicillins, Prednisone, and Wellbutrin [bupropion hcl]   Social History   Socioeconomic History   Marital status: Divorced    Spouse name: Not on file   Number of children: 3   Years of education: College   Highest education level: Not on file  Occupational History   Occupation: Emergency planning/management officer  Tobacco Use   Smoking status: Former    Packs/day: 1.00    Years: 20.00    Pack years: 20.00    Types: Cigarettes    Quit date: 05/21/1991    Years since quitting: 29.9   Smokeless tobacco: Never  Vaping Use   Vaping Use: Never used  Substance and Sexual Activity   Alcohol use: No   Drug use: No   Sexual activity: Not on file  Other Topics Concern   Not on file  Social History Narrative   Patient is divorced with 3 children.   Patient is right handed.   Patient has a college education.   Patient drinks 1-2 servings daily.   Social Determinants of Health   Financial Resource Strain: Not on file  Food Insecurity: No Food Insecurity   Worried About Charity fundraiser in the Last Year: Never true   Ran Out of Food in the Last Year: Never true  Transportation Needs: No Transportation Needs   Lack of Transportation (Medical): No   Lack of Transportation (Non-Medical): No  Physical Activity: Not on file  Stress: Not on file  Social Connections: Not on file     Family History: The patient's family history includes Asthma in her father; Breast cancer (age of onset: 29) in her  maternal grandmother; Colon polyps (age of onset: 32) in her sister; Emphysema in her father; Heart disease in her father and paternal grandfather. There is no history of Colon cancer.  ROS:   Please see the history of present illness.     All other systems reviewed and are negative.  EKGs/Labs/Other Studies Reviewed:    The following studies were reviewed today:  Echocardiogram 11/16/2020:   1. Afib with RVR noted during the study.   2. Left ventricular ejection fraction, by estimation, is 65 to 70%.  The  left ventricle has normal function. The left ventricle has no regional  wall motion abnormalities. There is moderate concentric left ventricular  hypertrophy. Left ventricular  diastolic function could not be evaluated.   3. Prominent RV epicardial fat. Right ventricular systolic function is  normal. The right ventricular size is normal. Tricuspid regurgitation  signal is inadequate for assessing PA pressure.   4. Left atrial size was mild to moderately dilated.   5. The mitral valve is degenerative. Trivial mitral valve regurgitation.  Mild mitral stenosis. The mean mitral valve gradient is 5.3 mmHg with  average heart rate of 106 bpm. Severe mitral annular calcification.   6. The aortic valve is grossly normal. Aortic valve regurgitation is not  visualized. No aortic stenosis is present.   7. The inferior vena cava is dilated in size with >50% respiratory  variability, suggesting right atrial pressure of 8 mmHg.     EKG: Sinus rhythm 73 previously reviewed.  03/13/21 showed sinus tachycardia 135.  QRS normal duration.  Recent Labs: 11/01/2020: TSH 1.150 03/08/2021: ALT 13 03/09/2021: Hemoglobin 11.4; Platelets 135 03/13/2021: BUN 6; Creatinine, Ser 0.57; Magnesium 2.2; Potassium 4.4; Sodium 133 03/15/2021: B Natriuretic Peptide 44.8  Recent Lipid Panel No results found for: CHOL, TRIG, HDL, CHOLHDL, VLDL, LDLCALC, LDLDIRECT   Risk Assessment/Calculations:               Physical Exam:    VS:  BP 120/60 (BP Location: Left Arm, Patient Position: Sitting, Cuff Size: Normal)   Pulse 76   Ht 5\' 2"  (1.575 m)   Wt 203 lb (92.1 kg)   SpO2 94%   BMI 37.13 kg/m     Wt Readings from Last 3 Encounters:  04/30/21 203 lb (92.1 kg)  03/15/21 212 lb 8.4 oz (96.4 kg)  03/05/21 212 lb 12.8 oz (96.5 kg)     GEN:  Well nourished, well developed in no acute distress HEENT: Normal NECK: No JVD; No carotid bruits LYMPHATICS: No lymphadenopathy CARDIAC: RRR, no murmurs, no rubs, gallops RESPIRATORY:  Clear to auscultation without rales, wheezing or rhonchi  ABDOMEN: Soft, non-tender, non-distended MUSCULOSKELETAL:  No edema; No deformity  SKIN: Warm and dry NEUROLOGIC:  Alert and oriented x 3 PSYCHIATRIC:  Normal affect   ASSESSMENT:    1. Paroxysmal atrial fibrillation (HCC)   2. Chronic anticoagulation   3. Chronic diastolic CHF (congestive heart failure) (HCC)   4. Other emphysema (HCC)    PLAN:    In order of problems listed above:  Paroxysmal atrial fibrillation (HCC) Flecainide 50 mg twice a day as well as diltiazem 240 mg daily.  Dr. Curt Bears has seen.  High risk medication monitoring continued.  Remaining in sinus rhythm.  With fatigue she tried diltiazem in the morning and metoprolol in the evening.  CHADSVASc 5.  Eliquis 5 mg twice a day.  Chronic anticoagulation Continue with Eliquis 5 mg twice a day.  No bleeding.  Monitor high risk medication with labs, EKG.  Chronic diastolic CHF (congestive heart failure) (HCC) Has severe mitral annular calcification EF 70% mean mitral valve gradient is 5 mmHg.  COPD (chronic obstructive pulmonary disease) ([place]) Appreciate pulmonary assistance.  Tammy Parrott note reviewed.          Medication Adjustments/Labs and Tests Ordered: Current medicines are reviewed at length with the patient today.  Concerns regarding medicines are outlined above.  No orders of the defined types were placed in this  encounter.  No orders of the defined types were  placed in this encounter.   Patient Instructions  Medication Instructions:  The current medical regimen is effective;  continue present plan and medications.  *If you need a refill on your cardiac medications before your next appointment, please call your pharmacy*  Follow-Up: At [facility], you and your health needs are our priority.  As part of our continuing mission to provide you with exceptional heart care, we have created designated Provider Care Teams.  These Care Teams include your primary Cardiologist (physician) and Advanced Practice Providers (APPs -  Physician Assistants and Nurse Practitioners) who all work together to provide you with the care you need, when you need it.  We recommend signing up for the patient portal called "MyChart".  Sign up information is provided on this After Visit Summary.  MyChart is used to connect with patients for Virtual Visits (Telemedicine).  Patients are able to view lab/test results, encounter notes, upcoming appointments, etc.  Non-urgent messages can be sent to your provider as well.   To learn more about what you can do with MyChart, go to [email or web address].    Your next appointment:   6 month(s)  The format for your next appointment:   In Person  Provider:   With any NP or PA and 1 year with Candee Furbish, MD     Thank you for choosing [facility]!!     Signed, Candee Furbish, MD  04/30/2021 2:19 PM    [place]

## 2021-04-30 NOTE — Assessment & Plan Note (Signed)
Flecainide 50 mg twice a day as well as diltiazem 240 mg daily.  Dr. Curt Bears has seen.  High risk medication monitoring continued.  Remaining in sinus rhythm.  With fatigue she tried diltiazem in the morning and metoprolol in the evening.  CHADSVASc 5.  Eliquis 5 mg twice a day.

## 2021-04-30 NOTE — Assessment & Plan Note (Signed)
Continue with Eliquis 5 mg twice a day.  No bleeding.  Monitor high risk medication with labs, EKG.

## 2021-04-30 NOTE — Assessment & Plan Note (Signed)
Appreciate pulmonary assistance.  Tammy Parrott note reviewed.

## 2021-04-30 NOTE — Assessment & Plan Note (Signed)
Has severe mitral annular calcification EF 70% mean mitral valve gradient is 5 mmHg.

## 2021-05-01 DIAGNOSIS — J449 Chronic obstructive pulmonary disease, unspecified: Secondary | ICD-10-CM | POA: Diagnosis not present

## 2021-05-01 DIAGNOSIS — J9621 Acute and chronic respiratory failure with hypoxia: Secondary | ICD-10-CM | POA: Diagnosis not present

## 2021-05-01 DIAGNOSIS — I480 Paroxysmal atrial fibrillation: Secondary | ICD-10-CM | POA: Diagnosis not present

## 2021-05-01 DIAGNOSIS — M48061 Spinal stenosis, lumbar region without neurogenic claudication: Secondary | ICD-10-CM | POA: Diagnosis not present

## 2021-05-01 DIAGNOSIS — I959 Hypotension, unspecified: Secondary | ICD-10-CM | POA: Diagnosis not present

## 2021-05-01 DIAGNOSIS — I5032 Chronic diastolic (congestive) heart failure: Secondary | ICD-10-CM | POA: Diagnosis not present

## 2021-05-03 DIAGNOSIS — I480 Paroxysmal atrial fibrillation: Secondary | ICD-10-CM | POA: Diagnosis not present

## 2021-05-03 DIAGNOSIS — I5032 Chronic diastolic (congestive) heart failure: Secondary | ICD-10-CM | POA: Diagnosis not present

## 2021-05-03 DIAGNOSIS — J449 Chronic obstructive pulmonary disease, unspecified: Secondary | ICD-10-CM | POA: Diagnosis not present

## 2021-05-03 DIAGNOSIS — I959 Hypotension, unspecified: Secondary | ICD-10-CM | POA: Diagnosis not present

## 2021-05-03 DIAGNOSIS — J9621 Acute and chronic respiratory failure with hypoxia: Secondary | ICD-10-CM | POA: Diagnosis not present

## 2021-05-03 DIAGNOSIS — M48061 Spinal stenosis, lumbar region without neurogenic claudication: Secondary | ICD-10-CM | POA: Diagnosis not present

## 2021-05-07 DIAGNOSIS — I5032 Chronic diastolic (congestive) heart failure: Secondary | ICD-10-CM | POA: Diagnosis not present

## 2021-05-07 DIAGNOSIS — J9621 Acute and chronic respiratory failure with hypoxia: Secondary | ICD-10-CM | POA: Diagnosis not present

## 2021-05-07 DIAGNOSIS — I959 Hypotension, unspecified: Secondary | ICD-10-CM | POA: Diagnosis not present

## 2021-05-07 DIAGNOSIS — M48061 Spinal stenosis, lumbar region without neurogenic claudication: Secondary | ICD-10-CM | POA: Diagnosis not present

## 2021-05-07 DIAGNOSIS — I480 Paroxysmal atrial fibrillation: Secondary | ICD-10-CM | POA: Diagnosis not present

## 2021-05-07 DIAGNOSIS — J449 Chronic obstructive pulmonary disease, unspecified: Secondary | ICD-10-CM | POA: Diagnosis not present

## 2021-05-08 ENCOUNTER — Encounter (INDEPENDENT_AMBULATORY_CARE_PROVIDER_SITE_OTHER): Payer: Medicare Other | Admitting: Ophthalmology

## 2021-05-08 ENCOUNTER — Other Ambulatory Visit: Payer: Self-pay

## 2021-05-08 DIAGNOSIS — H353112 Nonexudative age-related macular degeneration, right eye, intermediate dry stage: Secondary | ICD-10-CM

## 2021-05-08 DIAGNOSIS — H43813 Vitreous degeneration, bilateral: Secondary | ICD-10-CM

## 2021-05-08 DIAGNOSIS — H353221 Exudative age-related macular degeneration, left eye, with active choroidal neovascularization: Secondary | ICD-10-CM | POA: Diagnosis not present

## 2021-05-08 DIAGNOSIS — I10 Essential (primary) hypertension: Secondary | ICD-10-CM | POA: Diagnosis not present

## 2021-05-08 DIAGNOSIS — H35033 Hypertensive retinopathy, bilateral: Secondary | ICD-10-CM

## 2021-05-16 ENCOUNTER — Other Ambulatory Visit (HOSPITAL_COMMUNITY): Payer: Self-pay

## 2021-05-16 DIAGNOSIS — M48061 Spinal stenosis, lumbar region without neurogenic claudication: Secondary | ICD-10-CM | POA: Diagnosis not present

## 2021-05-16 DIAGNOSIS — J9621 Acute and chronic respiratory failure with hypoxia: Secondary | ICD-10-CM | POA: Diagnosis not present

## 2021-05-16 DIAGNOSIS — I5032 Chronic diastolic (congestive) heart failure: Secondary | ICD-10-CM | POA: Diagnosis not present

## 2021-05-16 DIAGNOSIS — K219 Gastro-esophageal reflux disease without esophagitis: Secondary | ICD-10-CM | POA: Diagnosis not present

## 2021-05-16 DIAGNOSIS — J441 Chronic obstructive pulmonary disease with (acute) exacerbation: Secondary | ICD-10-CM | POA: Diagnosis not present

## 2021-05-16 DIAGNOSIS — I480 Paroxysmal atrial fibrillation: Secondary | ICD-10-CM | POA: Diagnosis not present

## 2021-05-16 DIAGNOSIS — I959 Hypotension, unspecified: Secondary | ICD-10-CM | POA: Diagnosis not present

## 2021-05-16 DIAGNOSIS — I5033 Acute on chronic diastolic (congestive) heart failure: Secondary | ICD-10-CM | POA: Diagnosis not present

## 2021-05-16 DIAGNOSIS — E785 Hyperlipidemia, unspecified: Secondary | ICD-10-CM | POA: Diagnosis not present

## 2021-05-16 DIAGNOSIS — I10 Essential (primary) hypertension: Secondary | ICD-10-CM | POA: Diagnosis not present

## 2021-05-16 DIAGNOSIS — G43009 Migraine without aura, not intractable, without status migrainosus: Secondary | ICD-10-CM | POA: Diagnosis not present

## 2021-05-16 DIAGNOSIS — J449 Chronic obstructive pulmonary disease, unspecified: Secondary | ICD-10-CM | POA: Diagnosis not present

## 2021-05-16 DIAGNOSIS — D509 Iron deficiency anemia, unspecified: Secondary | ICD-10-CM | POA: Diagnosis not present

## 2021-05-16 DIAGNOSIS — J4520 Mild intermittent asthma, uncomplicated: Secondary | ICD-10-CM | POA: Diagnosis not present

## 2021-05-16 DIAGNOSIS — F331 Major depressive disorder, recurrent, moderate: Secondary | ICD-10-CM | POA: Diagnosis not present

## 2021-05-17 DIAGNOSIS — I480 Paroxysmal atrial fibrillation: Secondary | ICD-10-CM | POA: Diagnosis not present

## 2021-05-17 DIAGNOSIS — Z7952 Long term (current) use of systemic steroids: Secondary | ICD-10-CM | POA: Diagnosis not present

## 2021-05-17 DIAGNOSIS — G609 Hereditary and idiopathic neuropathy, unspecified: Secondary | ICD-10-CM | POA: Diagnosis not present

## 2021-05-17 DIAGNOSIS — E46 Unspecified protein-calorie malnutrition: Secondary | ICD-10-CM | POA: Diagnosis not present

## 2021-05-17 DIAGNOSIS — Z9981 Dependence on supplemental oxygen: Secondary | ICD-10-CM | POA: Diagnosis not present

## 2021-05-17 DIAGNOSIS — E782 Mixed hyperlipidemia: Secondary | ICD-10-CM | POA: Diagnosis not present

## 2021-05-17 DIAGNOSIS — Z7901 Long term (current) use of anticoagulants: Secondary | ICD-10-CM | POA: Diagnosis not present

## 2021-05-17 DIAGNOSIS — F411 Generalized anxiety disorder: Secondary | ICD-10-CM | POA: Diagnosis not present

## 2021-05-17 DIAGNOSIS — K219 Gastro-esophageal reflux disease without esophagitis: Secondary | ICD-10-CM | POA: Diagnosis not present

## 2021-05-17 DIAGNOSIS — F329 Major depressive disorder, single episode, unspecified: Secondary | ICD-10-CM | POA: Diagnosis not present

## 2021-05-17 DIAGNOSIS — J309 Allergic rhinitis, unspecified: Secondary | ICD-10-CM | POA: Diagnosis not present

## 2021-05-17 DIAGNOSIS — I5032 Chronic diastolic (congestive) heart failure: Secondary | ICD-10-CM | POA: Diagnosis not present

## 2021-05-17 DIAGNOSIS — D519 Vitamin B12 deficiency anemia, unspecified: Secondary | ICD-10-CM | POA: Diagnosis not present

## 2021-05-17 DIAGNOSIS — I959 Hypotension, unspecified: Secondary | ICD-10-CM | POA: Diagnosis not present

## 2021-05-17 DIAGNOSIS — Z7982 Long term (current) use of aspirin: Secondary | ICD-10-CM | POA: Diagnosis not present

## 2021-05-17 DIAGNOSIS — Z9181 History of falling: Secondary | ICD-10-CM | POA: Diagnosis not present

## 2021-05-17 DIAGNOSIS — J449 Chronic obstructive pulmonary disease, unspecified: Secondary | ICD-10-CM | POA: Diagnosis not present

## 2021-05-17 DIAGNOSIS — Z8616 Personal history of COVID-19: Secondary | ICD-10-CM | POA: Diagnosis not present

## 2021-05-17 DIAGNOSIS — M48061 Spinal stenosis, lumbar region without neurogenic claudication: Secondary | ICD-10-CM | POA: Diagnosis not present

## 2021-05-17 DIAGNOSIS — J9621 Acute and chronic respiratory failure with hypoxia: Secondary | ICD-10-CM | POA: Diagnosis not present

## 2021-05-17 DIAGNOSIS — Z8701 Personal history of pneumonia (recurrent): Secondary | ICD-10-CM | POA: Diagnosis not present

## 2021-05-23 DIAGNOSIS — I5032 Chronic diastolic (congestive) heart failure: Secondary | ICD-10-CM | POA: Diagnosis not present

## 2021-05-23 DIAGNOSIS — J449 Chronic obstructive pulmonary disease, unspecified: Secondary | ICD-10-CM | POA: Diagnosis not present

## 2021-05-23 DIAGNOSIS — I480 Paroxysmal atrial fibrillation: Secondary | ICD-10-CM | POA: Diagnosis not present

## 2021-05-23 DIAGNOSIS — J9621 Acute and chronic respiratory failure with hypoxia: Secondary | ICD-10-CM | POA: Diagnosis not present

## 2021-05-23 DIAGNOSIS — I959 Hypotension, unspecified: Secondary | ICD-10-CM | POA: Diagnosis not present

## 2021-05-23 DIAGNOSIS — M48061 Spinal stenosis, lumbar region without neurogenic claudication: Secondary | ICD-10-CM | POA: Diagnosis not present

## 2021-05-24 ENCOUNTER — Ambulatory Visit (INDEPENDENT_AMBULATORY_CARE_PROVIDER_SITE_OTHER): Payer: Medicare Other | Admitting: Emergency Medicine

## 2021-05-24 ENCOUNTER — Other Ambulatory Visit: Payer: Self-pay

## 2021-05-24 ENCOUNTER — Ambulatory Visit (INDEPENDENT_AMBULATORY_CARE_PROVIDER_SITE_OTHER): Payer: Medicare Other

## 2021-05-24 ENCOUNTER — Encounter: Payer: Self-pay | Admitting: Emergency Medicine

## 2021-05-24 DIAGNOSIS — J441 Chronic obstructive pulmonary disease with (acute) exacerbation: Secondary | ICD-10-CM | POA: Diagnosis not present

## 2021-05-24 DIAGNOSIS — J438 Other emphysema: Secondary | ICD-10-CM

## 2021-05-24 DIAGNOSIS — J90 Pleural effusion, not elsewhere classified: Secondary | ICD-10-CM | POA: Diagnosis not present

## 2021-05-24 MED ORDER — PREDNISONE 10 MG PO TABS: ORAL_TABLET | ORAL | 0 refills | Status: DC

## 2021-05-24 MED ORDER — DOXYCYCLINE HYCLATE 100 MG PO TABS: 100.0000 mg | ORAL_TABLET | Freq: Two times a day (BID) | ORAL | 0 refills | Status: DC

## 2021-05-24 NOTE — Progress Notes (Signed)
Subjective:    Patient ID: [name] Molina, female    DOB: 03-26-1946, 76 y.o.   MRN: [id number] HPI  ROV 04/04/20 --76 year old woman with a history of COPD/fixed asthma, chronic cough in the setting of allergic rhinitis and GERD.  She has severe obstruction on pulmonary function testing 12/2018 with an FEV1 of 1.22 L (57% predicted).  We have been managing her on Trelegy. Uses albuterol rarely.  She uses loratadine daily, Singulair during the allergy season, fluticasone nasal spray.  Omeprazole daily. She reports that her breathing has been good lately. She had an acute exacerbation COPD and CHF, was found to have A Fib at that time. She has a monitor that shows that she is in / out A Fib, had tachycardia as recently as 11/12.    ROV 09/29/20 --76 year old woman follows up today for her history of COPD/fixed asthma with severe obstruction, allergic rhinitis, GERD.  She has chronic cough in the setting of the above. Currently managed on Trelegy, using albuterol HFA seldom, was continued on DuoNeb bid post-hospital.  Remains on Singulair, fluticasone nasal spray prn, loratadine. Concerned that the loratadine may not be working.  Omeprazole qd  She was in the hospital with a flare of her COPD at the end of March 2022.  COVID was negative.  Chest x-ray without any obvious pneumonia, showed bibasilar atelectasis and right lower lobe scar  Chest x-ray 08/13/2020 reviewed by me as above.   ROV 05/24/21 --[name] Molina is 69 and has a history of COPD/fixed asthma as well as chronic cough in the setting of allergic rhinitis and GERD.  She had COVID-19 in October and was hospitalized after a fall. Treated for PNA and AE-COPD.  She also received some extra lasix for associated LE edema. She has gained some strength with rehab. She is having increased cough for last few days, had a fever last 2 days. Also some increased exertional SOB.  She has been managed on Trelegy, uses albuterol or DuoNeb, increased use over the  last 2 days.  Remains on Singulair, fluticasone nasal spray,    No flowsheet data found.     Objective:   Physical Exam Vitals:   05/24/21 1526  BP: 102/60  Pulse: 97  Temp: 97.7 F (36.5 C)  TempSrc: Oral  SpO2: 97%  Weight: 202 lb 9.6 oz (91.9 kg)  Height: 5\' 2"  (1.575 m)    Gen: Pleasant, overweight woman, in no distress,  normal affect, wheelchair  ENT: No lesions,  mouth clear,  oropharynx clear, no postnasal drip  Neck: No JVD, no stridor  Lungs: No use of accessory muscles, distant, here coarse bilaterally, bilateral expiratory rhonchi and wheezes  Cardiovascular: RRR, heart sounds normal, no murmur or gallops  Musculoskeletal: no deformities  Neuro: alert, non focal  Skin: Warm, no lesions or rashes     Assessment & Plan:  COPD (chronic obstructive pulmonary disease) (HCC) With acute exacerbation consistent with a bronchitis versus pneumonia.  We will check a chest x-ray now.  I will treat her with doxycycline and a prednisone taper.  She is at risk for worsening or decompensation.  Explained to her that if she does worsen over the weekend she will need to be seen in the ED or urgent care.  We will arrange for a video visit next week to ensure that she is stabilizing.  Chest x-ray today Please take doxycycline 100 mg twice a day for 7 days. Please take prednisone as directed until completely gone. Continue your  Trelegy 1 inhalation once daily.  Rinse and gargle after using. Use your albuterol 2 puffs up to every 4 hours if needed for shortness of breath, chest tightness, wheezing. Continue your oxygen at 2-3 L/min at all times. We will arrange for a video visit with APP next week to assess how you are doing. Follow Dr. Lamonte Sakai in 3 months or sooner if you have any problems.   Baltazar Apo, MD, PhD 05/24/2021, 3:47 PM [facility] [phone] or if no answer [phone]

## 2021-05-24 NOTE — Patient Instructions (Signed)
Chest x-ray today Please take doxycycline 100 mg twice a day for 7 days. Please take prednisone as directed until completely gone. Continue your Trelegy 1 inhalation once daily.  Rinse and gargle after using. Use your albuterol 2 puffs up to every 4 hours if needed for shortness of breath, chest tightness, wheezing. Continue your oxygen at 2-3 L/min at all times. We will arrange for a video visit with APP next week to assess how you are doing. Follow Dr. Lamonte Sakai in 3 months or sooner if you have any problems.

## 2021-05-24 NOTE — Assessment & Plan Note (Signed)
With acute exacerbation consistent with a bronchitis versus pneumonia.  We will check a chest x-ray now.  I will treat her with doxycycline and a prednisone taper.  She is at risk for worsening or decompensation.  Explained to her that if she does worsen over the weekend she will need to be seen in the ED or urgent care.  We will arrange for a video visit next week to ensure that she is stabilizing.  Chest x-ray today Please take doxycycline 100 mg twice a day for 7 days. Please take prednisone as directed until completely gone. Continue your Trelegy 1 inhalation once daily.  Rinse and gargle after using. Use your albuterol 2 puffs up to every 4 hours if needed for shortness of breath, chest tightness, wheezing. Continue your oxygen at 2-3 L/min at all times. We will arrange for a video visit with APP next week to assess how you are doing. Follow Dr. Lamonte Sakai in 3 months or sooner if you have any problems.

## 2021-05-25 ENCOUNTER — Telehealth: Payer: Self-pay | Admitting: Emergency Medicine

## 2021-05-25 ENCOUNTER — Other Ambulatory Visit: Payer: Self-pay | Admitting: *Deleted

## 2021-05-25 NOTE — Patient Outreach (Signed)
[facility]) Care Management  [place]  05/25/2021   [name] Molina 20-Mar-1946 [id number]  Spoke with pt today concerning ongoing management of care related to the current plan of care. Noted updates with the plan and will continue to encourage pt on the discussed interventions and goals address today. Pt with clear understanding and aware of what to do if acute symptoms should occur. Will strongly encourage pt to start on the recommended regimen for the recent ordered medications to resolved related symptoms.  Encounter Medications:  Outpatient Encounter Medications as of 05/25/2021  Medication Sig   acetaminophen (TYLENOL) 650 MG CR tablet Take 1,300 mg by mouth every 8 (eight) hours as needed for pain.   albuterol (VENTOLIN HFA) 108 (90 Base) MCG/ACT inhaler Inhale 2 puffs into the lungs every 6 (six) hours as needed for wheezing or shortness of breath.   apixaban (ELIQUIS) 5 MG TABS tablet Take 1 tablet (5 mg total) by mouth 2 (two) times daily.   BESIVANCE 0.6 % SUSP Place 1 drop into the left eye See admin instructions. Instill 1 drop into the left eye 4 times daily the day OF and day AFTER (eye injection)   cetirizine (ZYRTEC) 10 MG tablet TAKE ONE TABLET BY MOUTH ONCE DAILY   Cyanocobalamin (VITAMIN B-12 PO) Take 2,500 mcg by mouth daily.   diltiazem (CARDIZEM CD) 240 MG 24 hr capsule Take 1 capsule (240 mg total) by mouth daily.   doxycycline (VIBRA-TABS) 100 MG tablet Take 1 tablet (100 mg total) by mouth 2 (two) times daily.   DULoxetine (CYMBALTA) 60 MG capsule Take 60 mg by mouth every morning.   flecainide (TAMBOCOR) 50 MG tablet Take 1 tablet (50 mg total) by mouth every 12 (twelve) hours.   fluticasone (FLONASE) 50 MCG/ACT nasal spray Place 1 spray into both nostrils daily.   furosemide (LASIX) 40 MG tablet Take 1 tablet (40 mg total) by mouth daily.   ipratropium-albuterol (DUONEB) 0.5-2.5 (3) MG/3ML SOLN INHALE contents of one vial using nebulizer  TWICE DAILY   LORazepam (ATIVAN) 0.5 MG tablet Take 1-2 tablets (0.5-1 mg total) by mouth daily as needed for anxiety. Anxiety   metoprolol succinate (TOPROL-XL) 50 MG 24 hr tablet TAKE ONE TABLET BY MOUTH EVERYDAY AT BEDTIME   montelukast (SINGULAIR) 10 MG tablet Take 10 mg by mouth at bedtime.   Multiple Vitamins-Minerals (PRESERVISION AREDS 2+MULTI VIT) CAPS Take 1 tablet by mouth 2 (two) times daily.   omeprazole (PRILOSEC OTC) 20 MG tablet Take 20 mg by mouth at bedtime.   pravastatin (PRAVACHOL) 40 MG tablet Take 40 mg by mouth at bedtime.    predniSONE (DELTASONE) 10 MG tablet Take 40mg  daily for 3 days, then 30mg  daily for 3 days, then 20mg  daily for 3 days, then 10mg  daily for 3 days, then stop   promethazine-dextromethorphan (PROMETHAZINE-DM) 6.25-15 MG/5ML syrup Take 5 mLs by mouth at bedtime.   TRELEGY ELLIPTA 100-62.5-25 MCG/INH AEPB INHALE 1 PUFF BY MOUTH EVERY DAY   No facility-administered encounter medications on file as of 05/25/2021.    Functional Status:  In your present state of health, do you have any difficulty performing the following activities: 04/23/2021 03/07/2021  Hearing? N N  Vision? N N  Difficulty concentrating or making decisions? N N  Walking or climbing stairs? Y Y  Comment Use assisted device when needed -  Dressing or bathing? N N  Doing errands, shopping? Y N  Comment uses assistance with this task -  Preparing  Food and eating ? N -  Using the Toilet? N -  Do you have problems with loss of bowel control? N -  Managing your Medications? N -  Managing your Finances? N -  Housekeeping or managing your Housekeeping? N -  Some recent data might be hidden    Fall/Depression Screening: Fall Risk  04/23/2021 09/16/2014  Falls in the past year? 0 Yes  Number falls in past yr: - 1  Injury with Fall? - No  Risk for fall due to : History of fall(s) -  Follow up Falls prevention discussed -   PHQ 2/9 Scores 04/23/2021  PHQ - 2 Score 0    Assessment:    Care Plan Care Plan : RN Care Manager plan of care  Updates made by Tobi Bastos, RN since 05/25/2021 12:00 AM     Problem: Knowledge deficit related to falls/weakness/vertigo and care coordination needs   Priority: High     Long-Range Goal: Development plan of care for management of Falls/Weakness   Start Date: 04/23/2021  Expected End Date: 09/14/2021  This Visit's Progress: On track  Priority: High  Note:   Current Barriers:  Knowledge Deficits related to plan of care for management of Weakness/Falls prevention/Vertigo   RNCM Clinical Goal(s):  Patient will Knowledge Deficits related to plan of care for management of Weakness/Falls prevention/Vertigo through collaboration with RN Care manager, provider, and care team.   Interventions: Inter-disciplinary care team collaboration (see longitudinal plan of care) Evaluation of current treatment plan related to  self management and patient's adherence to plan as established by provider   Health Maintenance Interventions:  (Status:  Goal on track:  Yes.) Long Term Goal Knowledge Deficits related to plan of care for management of Weakness/Falls prevention/Vertigo   Update 1/5: Accidentally "stumbled" with small injury of soreness but continue to request assistance from her support team when needed. No injuries requiring ED visit at this time.  COPD Interventions:  (Status:  New goal.) Long Term Goal Provided patient with basic written and verbal COPD education on self care/management/and exacerbation prevention Advised patient to track and manage COPD triggers Provided written and verbal instructions on pursed lip breathing and utilized returned demonstration as teach back Provided instruction about proper use of medications used for management of COPD including inhalers Provided education about and advised patient to utilize infection prevention strategies to reduce risk of respiratory infection   New (Jan): Reports some  congestion with recent PCP office visit requiring doxycycline and prednisone ( awaiting pharmacy to deliver) however breathing without difficulty. Educated on the COPD action plan and what to do if acute symptoms occur. Will send COPD education information via AVS for reference and teach-back level of knowledge.  Patient Goals/Self-Care Activities: Take all medications as prescribed Attend all scheduled provider appointments Call pharmacy for medication refills 3-7 days in advance of running out of medications Call provider office for new concerns or questions   Follow Up Plan:  Telephone follow up appointment with care management team member scheduled for:  Feb  2023 The patient has been provided with contact information for the care management team and has been advised to call with any health related questions or concerns.       Raina Mina, RN Care Management Coordinator [place] Office [phone]

## 2021-05-25 NOTE — Patient Instructions (Signed)
Visit Information  Thank you for taking time to visit with me today. Please don't hesitate to contact me if I can be of assistance to you before our next scheduled telephone appointment.  Following are the goals we discussed today:    COPD Interventions:  (Status:  New goal.) Long Term Goal Provided patient with basic written and verbal COPD education on self care/management/and exacerbation prevention Advised patient to track and manage COPD triggers Provided written and verbal instructions on pursed lip breathing and utilized returned demonstration as teach back Provided instruction about proper use of medications used for management of COPD including inhalers Provided education about and advised patient to utilize infection prevention strategies to reduce risk of respiratory infection   New (Jan): Reports some congestion with recent PCP office visit requiring doxycycline and prednisone ( awaiting pharmacy to deliver) however breathing without difficulty. Educated on the COPD action plan and what to do if acute symptoms occur. Will send COPD education information via AVS for reference and teach-back level of knowledge.

## 2021-05-28 ENCOUNTER — Other Ambulatory Visit: Payer: Self-pay | Admitting: Emergency Medicine

## 2021-05-28 DIAGNOSIS — J301 Allergic rhinitis due to pollen: Secondary | ICD-10-CM | POA: Diagnosis not present

## 2021-05-28 DIAGNOSIS — J9611 Chronic respiratory failure with hypoxia: Secondary | ICD-10-CM | POA: Diagnosis not present

## 2021-05-28 NOTE — Telephone Encounter (Signed)
Call made to patient, confirmed DOB. Made aware of CXR results. Voiced understanding.   Nothing further needed at this time.

## 2021-05-28 NOTE — Telephone Encounter (Signed)
RB please advise on CXR results. Thanks

## 2021-05-28 NOTE — Telephone Encounter (Signed)
Please let her know that I reviewed her CXR. I do not see any overt pneumonia - good news. She needs to complete the meds that we ordered at her office visit,.

## 2021-05-29 DIAGNOSIS — F331 Major depressive disorder, recurrent, moderate: Secondary | ICD-10-CM | POA: Diagnosis not present

## 2021-05-29 DIAGNOSIS — G43009 Migraine without aura, not intractable, without status migrainosus: Secondary | ICD-10-CM | POA: Diagnosis not present

## 2021-05-29 DIAGNOSIS — E785 Hyperlipidemia, unspecified: Secondary | ICD-10-CM | POA: Diagnosis not present

## 2021-05-29 DIAGNOSIS — M48061 Spinal stenosis, lumbar region without neurogenic claudication: Secondary | ICD-10-CM | POA: Diagnosis not present

## 2021-05-29 DIAGNOSIS — J441 Chronic obstructive pulmonary disease with (acute) exacerbation: Secondary | ICD-10-CM | POA: Diagnosis not present

## 2021-05-29 DIAGNOSIS — I959 Hypotension, unspecified: Secondary | ICD-10-CM | POA: Diagnosis not present

## 2021-05-29 DIAGNOSIS — J4520 Mild intermittent asthma, uncomplicated: Secondary | ICD-10-CM | POA: Diagnosis not present

## 2021-05-29 DIAGNOSIS — K219 Gastro-esophageal reflux disease without esophagitis: Secondary | ICD-10-CM | POA: Diagnosis not present

## 2021-05-29 DIAGNOSIS — I480 Paroxysmal atrial fibrillation: Secondary | ICD-10-CM | POA: Diagnosis not present

## 2021-05-29 DIAGNOSIS — I5032 Chronic diastolic (congestive) heart failure: Secondary | ICD-10-CM | POA: Diagnosis not present

## 2021-05-29 DIAGNOSIS — I10 Essential (primary) hypertension: Secondary | ICD-10-CM | POA: Diagnosis not present

## 2021-05-29 DIAGNOSIS — J449 Chronic obstructive pulmonary disease, unspecified: Secondary | ICD-10-CM | POA: Diagnosis not present

## 2021-05-29 DIAGNOSIS — J9621 Acute and chronic respiratory failure with hypoxia: Secondary | ICD-10-CM | POA: Diagnosis not present

## 2021-06-04 ENCOUNTER — Telehealth: Payer: Self-pay | Admitting: Emergency Medicine

## 2021-06-04 ENCOUNTER — Telehealth (INDEPENDENT_AMBULATORY_CARE_PROVIDER_SITE_OTHER): Payer: Medicare Other | Admitting: Nurse Practitioner

## 2021-06-04 ENCOUNTER — Encounter: Payer: Self-pay | Admitting: Nurse Practitioner

## 2021-06-04 ENCOUNTER — Encounter: Payer: Self-pay | Admitting: *Deleted

## 2021-06-04 DIAGNOSIS — J440 Chronic obstructive pulmonary disease with acute lower respiratory infection: Secondary | ICD-10-CM

## 2021-06-04 DIAGNOSIS — J209 Acute bronchitis, unspecified: Secondary | ICD-10-CM

## 2021-06-04 DIAGNOSIS — R0982 Postnasal drip: Secondary | ICD-10-CM | POA: Diagnosis not present

## 2021-06-04 DIAGNOSIS — J069 Acute upper respiratory infection, unspecified: Secondary | ICD-10-CM | POA: Insufficient documentation

## 2021-06-04 MED ORDER — AZITHROMYCIN 250 MG PO TABS: ORAL_TABLET | ORAL | 0 refills | Status: DC

## 2021-06-04 MED ORDER — AZELASTINE HCL 0.1 % NA SOLN: 2.0000 | Freq: Two times a day (BID) | NASAL | 1 refills | Status: DC

## 2021-06-04 MED ORDER — PREDNISONE 10 MG PO TABS: ORAL_TABLET | ORAL | 0 refills | Status: DC

## 2021-06-04 NOTE — Assessment & Plan Note (Signed)
Persistent, bronchitic cough with worsened post nasal drip. Breathing stable with improvement in SOB. Will tx with prednisone taper. Z pack. Continue Trelegy daily and PRN albuterol. Continue Singulair and Zyrtec. Restart flonase nasal spray 2 sprays each nostril daily. Astelin nasal spray 2 sprays each nostril Twice daily. Mucinex Twice daily and supportive care. Close follow up in 2 weeks.   Patient Instructions  -Continue Trelegy 1 puff daily. Brush tongue and rinse mouth afterwards -Continue Albuterol inhaler 2 puffs or Duoneb 3 mL neb every 6 hours as needed for shortness of breath or wheezing. Notify if symptoms persist despite rescue inhaler/neb use. -Restart flonase nasal spray 2 sprays each nostril daily -Continue zyrtec 10 mg daily -Continue singulair 10 mg At bedtime -Continue omeprazole 20 mg At bedtime  -Continue promethazine-dextromethorphan cough syrup 5 mL At bedtime for cough -Continue supplemental oxygen at 2-3 lpm for oxygen saturation goal >88-90%  -Prednisone taper. 4 tabs for 2 days, then 3 tabs for 2 days, 2 tabs for 2 days, then 1 tab for 2 days, then stop. Take in AM with food -Z pack. Take 2 tabs (500 mg) on day one followed by 1 tab (250 mg) daily for four days -Astelin nasal spray 2 sprays each nostril Twice daily until symptoms improve -Saline nasal spray 2-3 times a day as needed for nasal congestion/postnasal drip until symptoms improve -Mucinex 600 mg twice daily as needed until symptoms improve  Notify if worsening breathlessness, cough, mucus production, fatigue, or wheezing occurs.  Maintain up to date vaccinations, including influenza, COVID, and pneumococcal.  Wash your hands often and avoid sick exposures.  Encouraged masking in crowds.  Avoid triggers, when possible.  Exercise, as tolerated. Notify if worsening symptoms upon exertion occur.  Follow up in two weeks with Dr. Lamonte Sakai or Alanson Aly. If symptoms do not improve or worsen, please contact  office for sooner follow up or seek emergency care.

## 2021-06-04 NOTE — Telephone Encounter (Signed)
Spoke with the pt  She states still having issues with cough since 05/24/21 ov  According to her AVS from that visit she was supposed to have had video visit done a wk later  I set up up for one today at 3 pm to discuss her symptoms  Nothing further needed

## 2021-06-04 NOTE — Patient Instructions (Addendum)
-  Continue Trelegy 1 puff daily. Brush tongue and rinse mouth afterwards -Continue Albuterol inhaler 2 puffs or Duoneb 3 mL neb every 6 hours as needed for shortness of breath or wheezing. Notify if symptoms persist despite rescue inhaler/neb use. -Restart flonase nasal spray 2 sprays each nostril daily -Continue zyrtec 10 mg daily -Continue singulair 10 mg At bedtime -Continue omeprazole 20 mg At bedtime  -Continue promethazine-dextromethorphan cough syrup 5 mL At bedtime for cough -Continue supplemental oxygen at 2-3 lpm for oxygen saturation goal >88-90%  -Prednisone taper. 4 tabs for 2 days, then 3 tabs for 2 days, 2 tabs for 2 days, then 1 tab for 2 days, then stop. Take in AM with food -Z pack. Take 2 tabs (500 mg) on day one followed by 1 tab (250 mg) daily for four days -Astelin nasal spray 2 sprays each nostril Twice daily until symptoms improve -Saline nasal spray 2-3 times a day as needed for nasal congestion/postnasal drip until symptoms improve -Mucinex 600 mg twice daily as needed until symptoms improve  Notify if worsening breathlessness, cough, mucus production, fatigue, or wheezing occurs.  Maintain up to date vaccinations, including influenza, COVID, and pneumococcal.  Wash your hands often and avoid sick exposures.  Encouraged masking in crowds.  Avoid triggers, when possible.  Exercise, as tolerated. Notify if worsening symptoms upon exertion occur.  Follow up in two weeks with Dr. Lamonte Sakai or Alanson Aly. If symptoms do not improve or worsen, please contact office for sooner follow up or seek emergency care.

## 2021-06-04 NOTE — Progress Notes (Signed)
Patient ID: [name] Molina, female     DOB: 23-Nov-1945, 76 y.o.      MRN: [id number]  No chief complaint on file.   Virtual Visit via Video Note  I connected with [name] Molina on 06/04/21 at  3:00 PM EST by a video enabled telemedicine application and verified that I am speaking with the correct person using two identifiers.  Location: Patient: Home Provider: Office   I discussed the limitations of evaluation and management by telemedicine and the availability of in person appointments. The patient expressed understanding and agreed to proceed.  History of Present Illness: 76 year old female, former smoker (70 pack years) followed for COPD/asthma overlap, with emphysema, chronic respiratory failure, allergic rhinitis.  She is a patient [name] and was last seen in office on 05/24/2021.  Past medical history significant for hypertension, PAT on chronic anticoagulation therapy, chronic diastolic CHF, OSA, GERD, dysphagia, peripheral neuropathy, chronic headaches, HLD, pulmonary nodule, MDD.  TEST/EVENTS:   05/24/2021: OV with Dr. Lamonte Sakai.  Increased cough and shortness of breath.  CXR negative for pneumonia.  Treated with doxycycline and prednisone taper.  At risk for worsening or decompensation -ED precautions advised.  Continue Trelegy daily and albuterol as needed.  Continued on 2 to 3 L oxygen.  06/04/2021: Today -2 wk follow-up Patient presents today for 2-week follow-up after being treated for COPD exacerbation.  She reports her breathing has improved but she continues to experience persistent cough.  She also reports increased rhinorrhea, nasal congestion and worsening postnasal drip.  She has some soreness in her ribs due to persistent coughing.  She denies fevers, wheezing, orthopnea, PND, lower extremity swelling, chest pain, hemoptysis.  She felt minimal relief on the doxycycline.  She lost track of the prednisone taper and is unsure if she completed it properly.  She has 10 pills left  today.  She continues on Trelegy daily and has not required her albuterol inhaler.  She has not been using her Flonase nasal spray.  She continues on Zyrtec and Singulair.  She did take Delsym last night and was able to sleep without frequent coughing spells.  Overall she feels somewhat better despite her persistent cough and associated soreness.  Allergies  Allergen Reactions   Food Shortness Of Breath    ALLERGY= MUSHROOMS   Levaquin [Levofloxacin In D5w] Other (See Comments)    "made me want to die."   Tequin Shortness Of Breath   Erythromycin Other (See Comments)    GI UPSET   Oxycodone-Acetaminophen Itching   Codeine Nausea And Vomiting   Penicillins Rash and Other (See Comments)    Reaction occurred during childhood   Prednisone Rash   Wellbutrin [Bupropion Hcl] Other (See Comments)    SHAKING   Immunization History  Administered Date(s) Administered   Fluad Quad(high Dose 65+) 02/08/2020   Influenza Split 02/27/2010, 02/18/2011, 01/20/2012, 02/01/2013, 02/17/2014, 03/04/2018, 03/20/2021   Influenza Whole 02/17/2017   Influenza, High Dose Seasonal PF 02/22/2011, 02/20/2012, 02/18/2013, 02/21/2014, 03/01/2015, 03/07/2016, 01/27/2017, 03/05/2018, 03/09/2019, 02/17/2020   Influenza,inj,Quad PF,6+ Mos 02/20/2015, 02/18/2016   Janssen (J&J) SARS-COV-2 Vaccination 08/28/2019   Moderna SARS-COV2 Booster Vaccination 12/27/2020   Pneumococcal Conjugate-13 08/18/2013, 09/20/2013   Pneumococcal Polysaccharide-23 05/20/2006, 02/27/2010, 03/01/2015   Tdap 08/29/2011, 05/08/2017   Zoster Recombinat (Shingrix) 12/24/2017   Zoster, Live 05/21/2007, 09/02/2007, 12/24/2017, 03/27/2018   Past Medical History:  Diagnosis Date   Anxiety    Aortic atherosclerosis (HCC)    Arthritis    Asthma    Atrial  tachycardia ([place])    Cancer (HCC)    basal skin cancer on nose   Cataract    Chronic diastolic CHF (congestive heart failure) (HCC)    Complication of anesthesia    during cervical surgery  she woke up during the procedure   COPD (chronic obstructive pulmonary disease) ([place])    Depression    Diverticulosis    Family history of adverse reaction to anesthesia    mother had post op N&V   GERD (gastroesophageal reflux disease)    History of hiatal hernia    Hyperlipidemia    Hypertension    Insomnia    Macular degeneration    Migraines    Neuropathy    Obesity    PAF (paroxysmal atrial fibrillation) (HCC)    Pneumonia    Sleep apnea    history of it,not using a cpap, study 2018: didn't need cpap   Thyroid nodule 2000   radioactive caspule    Tobacco History: Social History   Tobacco Use  Smoking Status Former   Packs/day: 1.00   Years: 20.00   Pack years: 20.00   Types: Cigarettes   Quit date: 05/21/1991   Years since quitting: 30.0  Smokeless Tobacco Never   Counseling given: Not Answered   Outpatient Medications Prior to Visit  Medication Sig Dispense Refill   acetaminophen (TYLENOL) 650 MG CR tablet Take 1,300 mg by mouth every 8 (eight) hours as needed for pain.     albuterol (VENTOLIN HFA) 108 (90 Base) MCG/ACT inhaler Inhale 2 puffs into the lungs every 6 (six) hours as needed for wheezing or shortness of breath. 1 each 6   apixaban (ELIQUIS) 5 MG TABS tablet Take 1 tablet (5 mg total) by mouth 2 (two) times daily. 60 tablet 10   BESIVANCE 0.6 % SUSP Place 1 drop into the left eye See admin instructions. Instill 1 drop into the left eye 4 times daily the day OF and day AFTER (eye injection)     cetirizine (ZYRTEC) 10 MG tablet TAKE ONE TABLET BY MOUTH ONCE DAILY 30 tablet 4   Cyanocobalamin (VITAMIN B-12 PO) Take 2,500 mcg by mouth daily.     diltiazem (CARDIZEM CD) 240 MG 24 hr capsule Take 1 capsule (240 mg total) by mouth daily. 30 capsule 6   doxycycline (VIBRA-TABS) 100 MG tablet Take 1 tablet (100 mg total) by mouth 2 (two) times daily. 14 tablet 0   DULoxetine (CYMBALTA) 60 MG capsule Take 60 mg by mouth every morning.     flecainide (TAMBOCOR)  50 MG tablet Take 1 tablet (50 mg total) by mouth every 12 (twelve) hours. 60 tablet 5   fluticasone (FLONASE) 50 MCG/ACT nasal spray Place 1 spray into both nostrils daily. 16 g 5   furosemide (LASIX) 40 MG tablet Take 1 tablet (40 mg total) by mouth daily. 30 tablet 0   ipratropium-albuterol (DUONEB) 0.5-2.5 (3) MG/3ML SOLN INHALE contents of ONE vial using nebulizer TWICE DAILY 180 mL 1   LORazepam (ATIVAN) 0.5 MG tablet Take 1-2 tablets (0.5-1 mg total) by mouth daily as needed for anxiety. Anxiety 15 tablet 0   metoprolol succinate (TOPROL-XL) 50 MG 24 hr tablet TAKE ONE TABLET BY MOUTH EVERYDAY AT BEDTIME 90 tablet 2   montelukast (SINGULAIR) 10 MG tablet Take 10 mg by mouth at bedtime.     Multiple Vitamins-Minerals (PRESERVISION AREDS 2+MULTI VIT) CAPS Take 1 tablet by mouth 2 (two) times daily.     omeprazole (PRILOSEC OTC) 20 MG  tablet Take 20 mg by mouth at bedtime.     pravastatin (PRAVACHOL) 40 MG tablet Take 40 mg by mouth at bedtime.      promethazine-dextromethorphan (PROMETHAZINE-DM) 6.25-15 MG/5ML syrup Take 5 mLs by mouth at bedtime.     TRELEGY ELLIPTA 100-62.5-25 MCG/INH AEPB INHALE 1 PUFF BY MOUTH EVERY DAY 60 each 3   predniSONE (DELTASONE) 10 MG tablet Take 40mg  daily for 3 days, then 30mg  daily for 3 days, then 20mg  daily for 3 days, then 10mg  daily for 3 days, then stop 30 tablet 0   No facility-administered medications prior to visit.     Review of Systems:   Constitutional: No weight loss or gain, night sweats, fevers, chills, fatigue, or lassitude. HEENT: No headaches, difficulty swallowing, tooth/dental problems, or sore throat. No sneezing, itching, ear ache. +nasal congestion, rhinorrhea, post nasal drip CV:  No chest pain, orthopnea, PND, swelling in lower extremities, anasarca, dizziness, palpitations, syncope Resp: +non-productive paroxysmal cough. No shortness of breath with exertion or at rest. No excess mucus or change in color of mucus. No hemoptysis. No  wheezing.  No chest wall deformity GI:  No heartburn, indigestion, abdominal pain, nausea, vomiting, diarrhea, change in bowel habits, loss of appetite, bloody stools.  GU: No dysuria, change in color of urine, urgency or frequency.  No flank pain, no hematuria  Skin: No rash, lesions, ulcerations MSK:  No joint pain or swelling.  No decreased range of motion.  No back pain. Neuro: No dizziness or lightheadedness.  Psych: No depression or anxiety. Mood stable.   Observations/Objective: Patient is well-developed, well-nourished in no acute distress; A&Ox3. Resting comfortably at home, wearing oxygen. O2 97% on 2 lpm.  Unlabored breathing. Bronchitic sounding cough. Speech is clear and coherent with logical content.   05/24/2021 CXR 2 View: Strandy opacities in the lung bases favoring atelecatsis. Small pleural effusions bilateraly  Assessment and Plan: Acute bronchitis with COPD (HCC) Persistent, bronchitic cough with worsened post nasal drip. Breathing stable with improvement in SOB. Will tx with prednisone taper. Z pack. Continue Trelegy daily and PRN albuterol. Continue Singulair and Zyrtec. Restart flonase nasal spray 2 sprays each nostril daily. Astelin nasal spray 2 sprays each nostril Twice daily. Mucinex Twice daily and supportive care. Close follow up in 2 weeks.   Patient Instructions  -Continue Trelegy 1 puff daily. Brush tongue and rinse mouth afterwards -Continue Albuterol inhaler 2 puffs or Duoneb 3 mL neb every 6 hours as needed for shortness of breath or wheezing. Notify if symptoms persist despite rescue inhaler/neb use. -Restart flonase nasal spray 2 sprays each nostril daily -Continue zyrtec 10 mg daily -Continue singulair 10 mg At bedtime -Continue omeprazole 20 mg At bedtime  -Continue promethazine-dextromethorphan cough syrup 5 mL At bedtime for cough -Continue supplemental oxygen at 2-3 lpm for oxygen saturation goal >88-90%  -Prednisone taper. 4 tabs for 2 days, then 3  tabs for 2 days, 2 tabs for 2 days, then 1 tab for 2 days, then stop. Take in AM with food -Z pack. Take 2 tabs (500 mg) on day one followed by 1 tab (250 mg) daily for four days -Astelin nasal spray 2 sprays each nostril Twice daily until symptoms improve -Saline nasal spray 2-3 times a day as needed for nasal congestion/postnasal drip until symptoms improve -Mucinex 600 mg twice daily as needed until symptoms improve  Notify if worsening breathlessness, cough, mucus production, fatigue, or wheezing occurs.  Maintain up to date vaccinations, including influenza, COVID, and pneumococcal.  Wash your hands often and avoid sick exposures.  Encouraged masking in crowds.  Avoid triggers, when possible.  Exercise, as tolerated. Notify if worsening symptoms upon exertion occur.  Follow up in two weeks with Dr. Lamonte Sakai or Alanson Aly. If symptoms do not improve or worsen, please contact office for sooner follow up or seek emergency care.   URI (upper respiratory infection) Worsened post nasal drip and nasal congestion. Supportive care. See above plan.    Follow Up Instructions: Follow up in 2 weeks with Dr. Lamonte Sakai or Alanson Aly. If symptoms do not improve or worsen, please contact office for sooner follow up or seek emergency care.    I discussed the assessment and treatment plan with the patient. The patient was provided an opportunity to ask questions and all were answered. The patient agreed with the plan and demonstrated an understanding of the instructions.   The patient was advised to call back or seek an in-person evaluation if the symptoms worsen or if the condition fails to improve as anticipated.  I provided 25 minutes of non-face-to-face time during this encounter.   Clayton Bibles, NP

## 2021-06-04 NOTE — Progress Notes (Deleted)
@Patient  ID: [name] Molina, female    DOB: [birth year], 76 y.o.   MRN: [id number]  No chief complaint on file.   Referring provider: Harlan Stains, MD  HPI:   TEST/EVENTS:   Allergies  Allergen Reactions   Food Shortness Of Breath    ALLERGY= MUSHROOMS   Levaquin [Levofloxacin In D5w] Other (See Comments)    "made me want to die."   Tequin Shortness Of Breath   Erythromycin Other (See Comments)    GI UPSET   Oxycodone-Acetaminophen Itching   Codeine Nausea And Vomiting   Penicillins Rash and Other (See Comments)    Reaction occurred during childhood   Prednisone Rash   Wellbutrin [Bupropion Hcl] Other (See Comments)    SHAKING    Immunization History  Administered Date(s) Administered   Fluad Quad(high Dose 65+) 02/08/2020   Influenza Split 02/27/2010, 02/18/2011, 01/20/2012, 02/01/2013, 02/17/2014, 03/04/2018, 03/20/2021   Influenza Whole 02/17/2017   Influenza, High Dose Seasonal PF 02/22/2011, 02/20/2012, 02/18/2013, 02/21/2014, 03/01/2015, 03/07/2016, 01/27/2017, 03/05/2018, 03/09/2019, 02/17/2020   Influenza,inj,Quad PF,6+ Mos 02/20/2015, 02/18/2016   Janssen (J&J) SARS-COV-2 Vaccination 08/28/2019   Moderna SARS-COV2 Booster Vaccination 12/27/2020   Pneumococcal Conjugate-13 08/18/2013, 09/20/2013   Pneumococcal Polysaccharide-23 05/20/2006, 02/27/2010, 03/01/2015   Tdap 08/29/2011, 05/08/2017   Zoster Recombinat (Shingrix) 12/24/2017   Zoster, Live 05/21/2007, 09/02/2007, 12/24/2017, 03/27/2018    Past Medical History:  Diagnosis Date   Anxiety    Aortic atherosclerosis ([place])    Arthritis    Asthma    Atrial tachycardia ([place])    Cancer ([place])    basal skin cancer on nose   Cataract    Chronic diastolic CHF (congestive heart failure) ([place])    Complication of anesthesia    during cervical surgery she woke up during the procedure   COPD (chronic obstructive pulmonary disease) ([place])    Depression    Diverticulosis    Family history of adverse reaction  to anesthesia    mother had post op N&V   GERD (gastroesophageal reflux disease)    History of hiatal hernia    Hyperlipidemia    Hypertension    Insomnia    Macular degeneration    Migraines    Neuropathy    Obesity    PAF (paroxysmal atrial fibrillation) ([place])    Pneumonia    Sleep apnea    history of it,not using a cpap, study 2018: didn't need cpap   Thyroid nodule 2000   radioactive caspule    Tobacco History: Social History   Tobacco Use  Smoking Status Former   Packs/day: 1.00   Years: 20.00   Pack years: 20.00   Types: Cigarettes   Quit date: 05/21/1991   Years since quitting: 30.0  Smokeless Tobacco Never   Counseling given: Not Answered   Outpatient Medications Prior to Visit  Medication Sig Dispense Refill   acetaminophen (TYLENOL) 650 MG CR tablet Take 1,300 mg by mouth every 8 (eight) hours as needed for pain.     albuterol (VENTOLIN HFA) 108 (90 Base) MCG/ACT inhaler Inhale 2 puffs into the lungs every 6 (six) hours as needed for wheezing or shortness of breath. 1 each 6   apixaban (ELIQUIS) 5 MG TABS tablet Take 1 tablet (5 mg total) by mouth 2 (two) times daily. 60 tablet 10   BESIVANCE 0.6 % SUSP Place 1 drop into the left eye See admin instructions. Instill 1 drop into the left eye 4 times daily the day OF and day AFTER (eye injection)  cetirizine (ZYRTEC) 10 MG tablet TAKE ONE TABLET BY MOUTH ONCE DAILY 30 tablet 4   Cyanocobalamin (VITAMIN B-12 PO) Take 2,500 mcg by mouth daily.     diltiazem (CARDIZEM CD) 240 MG 24 hr capsule Take 1 capsule (240 mg total) by mouth daily. 30 capsule 6   doxycycline (VIBRA-TABS) 100 MG tablet Take 1 tablet (100 mg total) by mouth 2 (two) times daily. 14 tablet 0   DULoxetine (CYMBALTA) 60 MG capsule Take 60 mg by mouth every morning.     flecainide (TAMBOCOR) 50 MG tablet Take 1 tablet (50 mg total) by mouth every 12 (twelve) hours. 60 tablet 5   fluticasone (FLONASE) 50 MCG/ACT nasal spray Place 1 spray into both  nostrils daily. 16 g 5   furosemide (LASIX) 40 MG tablet Take 1 tablet (40 mg total) by mouth daily. 30 tablet 0   ipratropium-albuterol (DUONEB) 0.5-2.5 (3) MG/3ML SOLN INHALE contents of ONE vial using nebulizer TWICE DAILY 180 mL 1   LORazepam (ATIVAN) 0.5 MG tablet Take 1-2 tablets (0.5-1 mg total) by mouth daily as needed for anxiety. Anxiety 15 tablet 0   metoprolol succinate (TOPROL-XL) 50 MG 24 hr tablet TAKE ONE TABLET BY MOUTH EVERYDAY AT BEDTIME 90 tablet 2   montelukast (SINGULAIR) 10 MG tablet Take 10 mg by mouth at bedtime.     Multiple Vitamins-Minerals (PRESERVISION AREDS 2+MULTI VIT) CAPS Take 1 tablet by mouth 2 (two) times daily.     omeprazole (PRILOSEC OTC) 20 MG tablet Take 20 mg by mouth at bedtime.     pravastatin (PRAVACHOL) 40 MG tablet Take 40 mg by mouth at bedtime.      predniSONE (DELTASONE) 10 MG tablet Take 40mg  daily for 3 days, then 30mg  daily for 3 days, then 20mg  daily for 3 days, then 10mg  daily for 3 days, then stop 30 tablet 0   promethazine-dextromethorphan (PROMETHAZINE-DM) 6.25-15 MG/5ML syrup Take 5 mLs by mouth at bedtime.     TRELEGY ELLIPTA 100-62.5-25 MCG/INH AEPB INHALE 1 PUFF BY MOUTH EVERY DAY 60 each 3   No facility-administered medications prior to visit.     Review of Systems:   Constitutional: No weight loss or gain, night sweats, fevers, chills, fatigue, or lassitude. HEENT: No headaches, difficulty swallowing, tooth/dental problems, or sore throat. No sneezing, itching, ear ache, nasal congestion, or post nasal drip CV:  No chest pain, orthopnea, PND, swelling in lower extremities, anasarca, dizziness, palpitations, syncope Resp: No shortness of breath with exertion or at rest. No excess mucus or change in color of mucus. No productive or non-productive. No hemoptysis. No wheezing.  No chest wall deformity GI:  No heartburn, indigestion, abdominal pain, nausea, vomiting, diarrhea, change in bowel habits, loss of appetite, bloody stools.   GU: No dysuria, change in color of urine, urgency or frequency.  No flank pain, no hematuria  Skin: No rash, lesions, ulcerations MSK:  No joint pain or swelling.  No decreased range of motion.  No back pain. Neuro: No dizziness or lightheadedness.  Psych: No depression or anxiety. Mood stable.     Physical Exam:  There were no vitals taken for this visit.  GEN: Pleasant, interactive, well-nourished/chronically-ill appearing/acutely-ill appearing/poorly-nourished/morbidly obese; in no acute distress.****** HEENT:  Normocephalic and atraumatic. EACs patent bilaterally. TM pearly gray with present light reflex bilaterally. PERRLA. Sclera white. Nasal turbinates pink, moist and patent bilaterally. No rhinorrhea present. Oropharynx pink and moist, without exudate or edema. No lesions, ulcerations, or postnasal drip.  NECK:  Supple w/  fair ROM. No JVD present. Normal carotid impulses w/o bruits. Thyroid symmetrical with no goiter or nodules palpated. No lymphadenopathy.   CV: RRR, no m/r/g, no peripheral edema. Pulses intact, +2 bilaterally. No cyanosis, pallor or clubbing. PULMONARY:  Unlabored, regular breathing. Clear bilaterally A&P w/o wheezes/rales/rhonchi. No accessory muscle use. No dullness to percussion. GI: BS present and normoactive. Soft, non-tender to palpation. No organomegaly or masses detected. No CVA tenderness. MSK: No erythema, warmth or tenderness. Cap refil <2 sec all extrem. No deformities or joint swelling noted.  Neuro: A/Ox3. No focal deficits noted.   Skin: Warm, no lesions or rashe Psych: Normal affect and behavior. Judgement and thought content appropriate.     Lab Results:  CBC    Component Value Date/Time   WBC 7.1 03/09/2021 0408   RBC 4.25 03/09/2021 0408   HGB 11.4 (L) 03/09/2021 0408   HGB 14.2 11/01/2020 1506   HCT 36.0 03/09/2021 0408   HCT 42.3 11/01/2020 1506   PLT 135 (L) 03/09/2021 0408   PLT 208 11/01/2020 1506   MCV 84.7 03/09/2021 0408    MCV 83 11/01/2020 1506   MCH 26.8 03/09/2021 0408   MCHC 31.7 03/09/2021 0408   RDW 15.9 (H) 03/09/2021 0408   RDW 13.6 11/01/2020 1506   LYMPHSABS 0.6 (L) 03/09/2021 0408   MONOABS 0.8 03/09/2021 0408   EOSABS 0.1 03/09/2021 0408   BASOSABS 0.0 03/09/2021 0408    BMET    Component Value Date/Time   NA 133 (L) 03/13/2021 1439   NA 144 11/23/2020 0951   K 4.4 03/13/2021 1439   CL 99 03/13/2021 1439   CO2 25 03/13/2021 1439   GLUCOSE 113 (H) 03/13/2021 1439   BUN 6 (L) 03/13/2021 1439   BUN 15 11/23/2020 0951   CREATININE 0.57 03/13/2021 1439   CALCIUM 8.4 (L) 03/13/2021 1439   GFRNONAA >60 03/13/2021 1439   GFRAA 69 03/08/2020 1517    BNP    Component Value Date/Time   BNP 44.8 03/15/2021 0501     Imaging:  DG Chest 2 View  Result Date: 05/24/2021 CLINICAL DATA:  COPD exacerbation. EXAM: CHEST - 2 VIEW COMPARISON:  Chest x-ray 11/16/2020. FINDINGS: There are strandy opacities in the lung bases. The lungs are otherwise clear. There are small pleural effusions bilaterally. The cardiomediastinal silhouette is within normal limits. No pneumothorax or acute fracture. There are degenerative changes of the left shoulder. Cervical spinal fusion plate is present. IMPRESSION: 1. Small pleural effusions. 2. Minimal bibasilar opacities favored is atelectasis. Mild edema or infection is excluded. Electronically Signed   By: Ronney Asters M.D.   On: 05/24/2021 22:09      PFT Results Latest Ref Rng & Units 01/15/2019  FVC-Pre L 2.17  FVC-Predicted Pre % 76  FVC-Post L 2.21  FVC-Predicted Post % 78  Pre FEV1/FVC % % 56  Post FEV1/FCV % % 59  FEV1-Pre L 1.22  FEV1-Predicted Pre % 57  FEV1-Post L 1.30  DLCO uncorrected ml/min/mmHg 16.18  DLCO [facility]% % 85  DLVA Predicted % 95  TLC L 4.84  TLC % Predicted % 96  RV % Predicted % 118    No results found for: NITRICOXIDE      Assessment & Plan:   No problem-specific Assessment & Plan notes found for this  encounter.     Clayton Bibles, NP 06/04/2021  Pt aware and understands NP's role.

## 2021-06-04 NOTE — Assessment & Plan Note (Signed)
Worsened post nasal drip and nasal congestion. Supportive care. See above plan.

## 2021-06-07 DIAGNOSIS — I959 Hypotension, unspecified: Secondary | ICD-10-CM | POA: Diagnosis not present

## 2021-06-07 DIAGNOSIS — I5032 Chronic diastolic (congestive) heart failure: Secondary | ICD-10-CM | POA: Diagnosis not present

## 2021-06-07 DIAGNOSIS — I480 Paroxysmal atrial fibrillation: Secondary | ICD-10-CM | POA: Diagnosis not present

## 2021-06-07 DIAGNOSIS — J9621 Acute and chronic respiratory failure with hypoxia: Secondary | ICD-10-CM | POA: Diagnosis not present

## 2021-06-07 DIAGNOSIS — J449 Chronic obstructive pulmonary disease, unspecified: Secondary | ICD-10-CM | POA: Diagnosis not present

## 2021-06-07 DIAGNOSIS — M48061 Spinal stenosis, lumbar region without neurogenic claudication: Secondary | ICD-10-CM | POA: Diagnosis not present

## 2021-06-12 DIAGNOSIS — I5032 Chronic diastolic (congestive) heart failure: Secondary | ICD-10-CM | POA: Diagnosis not present

## 2021-06-12 DIAGNOSIS — I480 Paroxysmal atrial fibrillation: Secondary | ICD-10-CM | POA: Diagnosis not present

## 2021-06-12 DIAGNOSIS — I959 Hypotension, unspecified: Secondary | ICD-10-CM | POA: Diagnosis not present

## 2021-06-12 DIAGNOSIS — J449 Chronic obstructive pulmonary disease, unspecified: Secondary | ICD-10-CM | POA: Diagnosis not present

## 2021-06-12 DIAGNOSIS — M48061 Spinal stenosis, lumbar region without neurogenic claudication: Secondary | ICD-10-CM | POA: Diagnosis not present

## 2021-06-12 DIAGNOSIS — J9621 Acute and chronic respiratory failure with hypoxia: Secondary | ICD-10-CM | POA: Diagnosis not present

## 2021-06-13 ENCOUNTER — Telehealth: Payer: Self-pay | Admitting: Emergency Medicine

## 2021-06-13 DIAGNOSIS — J9621 Acute and chronic respiratory failure with hypoxia: Secondary | ICD-10-CM | POA: Diagnosis not present

## 2021-06-13 DIAGNOSIS — M48061 Spinal stenosis, lumbar region without neurogenic claudication: Secondary | ICD-10-CM | POA: Diagnosis not present

## 2021-06-13 DIAGNOSIS — J449 Chronic obstructive pulmonary disease, unspecified: Secondary | ICD-10-CM | POA: Diagnosis not present

## 2021-06-13 DIAGNOSIS — I5032 Chronic diastolic (congestive) heart failure: Secondary | ICD-10-CM | POA: Diagnosis not present

## 2021-06-13 DIAGNOSIS — I959 Hypotension, unspecified: Secondary | ICD-10-CM | POA: Diagnosis not present

## 2021-06-13 DIAGNOSIS — I480 Paroxysmal atrial fibrillation: Secondary | ICD-10-CM | POA: Diagnosis not present

## 2021-06-13 NOTE — Telephone Encounter (Signed)
Called and spoke with pt who stated she has finished the prednisone and zpak. Pt stated that she had to stop using the azelastine nasal spray as it was making her nose run more.  Asked pt how she was feeling after finishing the prednisone and zpak and she said she was feeling better. Routing to [facility] as an [place].

## 2021-06-13 NOTE — Telephone Encounter (Signed)
Glad to hear she's better. Thanks!

## 2021-06-18 ENCOUNTER — Other Ambulatory Visit: Payer: Self-pay

## 2021-06-18 ENCOUNTER — Telehealth (INDEPENDENT_AMBULATORY_CARE_PROVIDER_SITE_OTHER): Payer: Medicare Other | Admitting: Nurse Practitioner

## 2021-06-18 ENCOUNTER — Ambulatory Visit (INDEPENDENT_AMBULATORY_CARE_PROVIDER_SITE_OTHER): Payer: Medicare Other

## 2021-06-18 ENCOUNTER — Ambulatory Visit: Payer: Medicare Other

## 2021-06-18 ENCOUNTER — Encounter: Payer: Self-pay | Admitting: Nurse Practitioner

## 2021-06-18 DIAGNOSIS — J9611 Chronic respiratory failure with hypoxia: Secondary | ICD-10-CM

## 2021-06-18 DIAGNOSIS — I5032 Chronic diastolic (congestive) heart failure: Secondary | ICD-10-CM

## 2021-06-18 DIAGNOSIS — J440 Chronic obstructive pulmonary disease with acute lower respiratory infection: Secondary | ICD-10-CM | POA: Diagnosis not present

## 2021-06-18 DIAGNOSIS — J90 Pleural effusion, not elsewhere classified: Secondary | ICD-10-CM

## 2021-06-18 DIAGNOSIS — J209 Acute bronchitis, unspecified: Secondary | ICD-10-CM

## 2021-06-18 DIAGNOSIS — R0602 Shortness of breath: Secondary | ICD-10-CM | POA: Diagnosis not present

## 2021-06-18 LAB — BASIC METABOLIC PANEL
BUN: 11 mg/dL (ref 6–23)
CO2: 31 mEq/L (ref 19–32)
Calcium: 9.5 mg/dL (ref 8.4–10.5)
Chloride: 103 mEq/L (ref 96–112)
Creatinine, Ser: 0.77 mg/dL (ref 0.40–1.20)
GFR: 74.59 mL/min (ref >60.00)
Glucose, Bld: 114 mg/dL — ABNORMAL HIGH (ref 70–99)
Potassium: 4.5 mEq/L (ref 3.5–5.1)
Sodium: 143 mEq/L (ref 135–145)

## 2021-06-18 LAB — BRAIN NATRIURETIC PEPTIDE: Pro B Natriuretic peptide (BNP): 92 pg/mL (ref 0.0–100.0)

## 2021-06-18 MED ORDER — FUROSEMIDE 40 MG PO TABS: 40.0000 mg | ORAL_TABLET | Freq: Every day | ORAL | 0 refills | Status: DC

## 2021-06-18 NOTE — Progress Notes (Signed)
Notified patient that her chest x ray showed b/l small effusions as well as streaky opacities in both lung bases R>L. Concerning for pulmonary edema given other symptoms. Advised she increase her lasix from 40 mg daily to 40 mg Twice daily for 3 days. Verbalized understanding.

## 2021-06-18 NOTE — Assessment & Plan Note (Signed)
Minimal improvement in symptoms despite doxy, z pack and prednisone taper. Advised she come in for CXR and labs. Previous CXR 1/5 with b/l small pleural effusions. Concern for worsening given persistent symptoms and BLE edema. May consider short duration of lasix depending on BMET.   Patient Instructions  -Continue Trelegy 1 puff daily. Brush tongue and rinse mouth afterwards -Continue Albuterol inhaler 2 puffs or Duoneb 3 mL neb every 6 hours as needed for shortness of breath or wheezing. Notify if symptoms persist despite rescue inhaler/neb use. -Restart flonase nasal spray 2 sprays each nostril daily -Continue zyrtec 10 mg daily -Continue singulair 10 mg At bedtime -Continue omeprazole 20 mg At bedtime  -Continue promethazine-dextromethorphan cough syrup 5 mL At bedtime for cough -Continue supplemental oxygen at 2-3 lpm for oxygen saturation goal >88-90% -Continue mucinex 600 mg twice daily as needed until symptoms improve   Notify if worsening breathlessness, cough, mucus production, fatigue, or wheezing occurs.  Maintain up to date vaccinations, including influenza, COVID, and pneumococcal.  Wash your hands often and avoid sick exposures.  Encouraged masking in crowds.  Avoid triggers, when possible.  Exercise, as tolerated. Notify if worsening symptoms upon exertion occur.   Advise to come into the office today for repeat chest x ray and labs. Will determine follow up and next steps after.

## 2021-06-18 NOTE — Patient Instructions (Addendum)
-  Continue Trelegy 1 puff daily. Brush tongue and rinse mouth afterwards -Continue Albuterol inhaler 2 puffs or Duoneb 3 mL neb every 6 hours as needed for shortness of breath or wheezing. Notify if symptoms persist despite rescue inhaler/neb use. -Restart flonase nasal spray 2 sprays each nostril daily -Continue zyrtec 10 mg daily -Continue singulair 10 mg At bedtime -Continue omeprazole 20 mg At bedtime  -Continue promethazine-dextromethorphan cough syrup 5 mL At bedtime for cough -Continue supplemental oxygen at 2-3 lpm for oxygen saturation goal >88-90% -Continue mucinex 600 mg twice daily as needed until symptoms improve   Notify if worsening breathlessness, cough, mucus production, fatigue, or wheezing occurs.  Maintain up to date vaccinations, including influenza, COVID, and pneumococcal.  Wash your hands often and avoid sick exposures.  Encouraged masking in crowds.  Avoid triggers, when possible.  Exercise, as tolerated. Notify if worsening symptoms upon exertion occur.   Advise to come into the office today for repeat chest x ray and labs. Will determine follow up and next steps after.

## 2021-06-18 NOTE — Progress Notes (Signed)
Nl results pool

## 2021-06-18 NOTE — Addendum Note (Signed)
Addended by: Clayton Bibles on: 06/18/2021 05:41 PM   Modules accepted: Orders

## 2021-06-18 NOTE — Progress Notes (Signed)
Patient ID: [name] Molina, female     DOB: Dec 24, 1945, 76 y.o.      MRN: [id number]  No chief complaint on file.   Virtual Visit via Video Note  I connected with [name] Molina on 06/18/21 at 12:00 PM EST by a video enabled telemedicine application and verified that I am speaking with the correct person using two identifiers.  Location: Patient: Home Provider: Office   I discussed the limitations of evaluation and management by telemedicine and the availability of in person appointments. The patient expressed understanding and agreed to proceed.  History of Present Illness: 76 year old female, former smoker (70 pack years) followed for COPD/asthma overlap with emphysema, chronic respiratory failure, allergic rhinitis.  She is a patient of Dr. Agustina Molina and was last seen via virtual visit on 06/04/2021.  Past medical history significant for hypertension, PAF on chronic anticoagulation therapy, chronic diastolic CHF, OSA, GERD, dysphagia, peripheral neuropathy, chronic headaches, HLD, pulmonary nodule, MDD.  05/24/2021: OV with Dr. Lamonte Molina.  Increased cough and shortness of breath.  CXR negative for pneumonia; did have bilateral small pleural effusions.  Treated with Doxy and prednisone taper.  Close follow-up with ED precautions due to risk for decompensation.  Continue Trelegy daily and albuterol as needed.  Continue on 2 to 3 L/min O2.   06/04/2021: Virtual visit with [name] Schmale NP. Persistent cough; improved SOB. Increased URI symptoms. Completed doxy with minimal relief. Lost track of previously prescribed pred taper so didn't complete. Rx prednisone taper and z pack for atypical pathogens. Continued Trelegy, Zyrtec and Singulair. Advised to restart flonase. Astelin nasal spray and mucinex advised. Continue supplemental O2 2-3 lpm.   06/18/2021: Today - follow up Patient presents today for follow up of cough. She reports that she is still coughing and feels as though it is a little worse. Her shortness of  breath persists and is no worse but no better. She does report that one of the home health nurses told her she had pitting edema in her lower extremities. She feels as though it is slightly better but still present. She denies any recent fevers or chills. She denies orthopnea, PND, wheezing or chest pain. Her oxygen is 96% on 3 lpm and she has not had any increasing requirements. She has had it off intermittently and maintained above 90%. She continues on Trelegy, zyrtec and singulair daily. She has been using her flonase but stopped the astelin as she didn't like the taste. She feels like she has had some improvement in her URI symptoms.   Allergies  Allergen Reactions   Food Shortness Of Breath    ALLERGY= MUSHROOMS   Levaquin [Levofloxacin In D5w] Other (See Comments)    "made me want to die."   Tequin Shortness Of Breath   Erythromycin Other (See Comments)    GI UPSET   Oxycodone-Acetaminophen Itching   Codeine Nausea And Vomiting   Penicillins Rash and Other (See Comments)    Reaction occurred during childhood   Prednisone Rash   Wellbutrin [Bupropion Hcl] Other (See Comments)    SHAKING   Immunization History  Administered Date(s) Administered   Fluad Quad(high Dose 65+) 02/08/2020   Influenza Split 02/27/2010, 02/18/2011, 01/20/2012, 02/01/2013, 02/17/2014, 03/04/2018, 03/20/2021   Influenza Whole 02/17/2017   Influenza, High Dose Seasonal PF 02/22/2011, 02/20/2012, 02/18/2013, 02/21/2014, 03/01/2015, 03/07/2016, 01/27/2017, 03/05/2018, 03/09/2019, 02/17/2020   Influenza,inj,Quad PF,6+ Mos 02/20/2015, 02/18/2016   Janssen (J&J) SARS-COV-2 Vaccination 08/28/2019   Moderna SARS-COV2 Booster Vaccination 12/27/2020   Pneumococcal Conjugate-13 08/18/2013,  09/20/2013   Pneumococcal Polysaccharide-23 05/20/2006, 02/27/2010, 03/01/2015   Tdap 08/29/2011, 05/08/2017   Zoster Recombinat (Shingrix) 12/24/2017   Zoster, Live 05/21/2007, 09/02/2007, 12/24/2017, 03/27/2018   Past Medical  History:  Diagnosis Date   Anxiety    Aortic atherosclerosis (HCC)    Arthritis    Asthma    Atrial tachycardia (HCC)    Cancer (HCC)    basal skin cancer on nose   Cataract    Chronic diastolic CHF (congestive heart failure) (HCC)    Complication of anesthesia    during cervical surgery she woke up during the procedure   COPD (chronic obstructive pulmonary disease) ([place])    Depression    Diverticulosis    Family history of adverse reaction to anesthesia    mother had post op N&V   GERD (gastroesophageal reflux disease)    History of hiatal hernia    Hyperlipidemia    Hypertension    Insomnia    Macular degeneration    Migraines    Neuropathy    Obesity    PAF (paroxysmal atrial fibrillation) (HCC)    Pneumonia    Sleep apnea    history of it,not using a cpap, study 2018: didn't need cpap   Thyroid nodule 2000   radioactive caspule    Tobacco History: Social History   Tobacco Use  Smoking Status Former   Packs/day: 1.00   Years: 20.00   Pack years: 20.00   Types: Cigarettes   Quit date: 05/21/1991   Years since quitting: 30.0  Smokeless Tobacco Never   Counseling given: Not Answered   Outpatient Medications Prior to Visit  Medication Sig Dispense Refill   acetaminophen (TYLENOL) 650 MG CR tablet Take 1,300 mg by mouth every 8 (eight) hours as needed for pain.     albuterol (VENTOLIN HFA) 108 (90 Base) MCG/ACT inhaler Inhale 2 puffs into the lungs every 6 (six) hours as needed for wheezing or shortness of breath. 1 each 6   apixaban (ELIQUIS) 5 MG TABS tablet Take 1 tablet (5 mg total) by mouth 2 (two) times daily. 60 tablet 10   azelastine (ASTELIN) 0.1 % nasal spray Place 2 sprays into both nostrils 2 (two) times daily. Use in each nostril as directed 30 mL 1   cetirizine (ZYRTEC) 10 MG tablet TAKE ONE TABLET BY MOUTH ONCE DAILY 30 tablet 4   Cyanocobalamin (VITAMIN B-12 PO) Take 2,500 mcg by mouth daily.     diltiazem (CARDIZEM CD) 240 MG 24 hr capsule Take  1 capsule (240 mg total) by mouth daily. 30 capsule 6   DULoxetine (CYMBALTA) 60 MG capsule Take 60 mg by mouth every morning.     flecainide (TAMBOCOR) 50 MG tablet Take 1 tablet (50 mg total) by mouth every 12 (twelve) hours. 60 tablet 5   fluticasone (FLONASE) 50 MCG/ACT nasal spray Place 1 spray into both nostrils daily. 16 g 5   furosemide (LASIX) 40 MG tablet Take 1 tablet (40 mg total) by mouth daily. 30 tablet 0   ipratropium-albuterol (DUONEB) 0.5-2.5 (3) MG/3ML SOLN INHALE contents of ONE vial using nebulizer TWICE DAILY 180 mL 1   LORazepam (ATIVAN) 0.5 MG tablet Take 1-2 tablets (0.5-1 mg total) by mouth daily as needed for anxiety. Anxiety 15 tablet 0   metoprolol succinate (TOPROL-XL) 50 MG 24 hr tablet TAKE ONE TABLET BY MOUTH EVERYDAY AT BEDTIME 90 tablet 2   montelukast (SINGULAIR) 10 MG tablet Take 10 mg by mouth at bedtime.  Multiple Vitamins-Minerals (PRESERVISION AREDS 2+MULTI VIT) CAPS Take 1 tablet by mouth 2 (two) times daily.     omeprazole (PRILOSEC OTC) 20 MG tablet Take 20 mg by mouth at bedtime.     pravastatin (PRAVACHOL) 40 MG tablet Take 40 mg by mouth at bedtime.      TRELEGY ELLIPTA 100-62.5-25 MCG/INH AEPB INHALE 1 PUFF BY MOUTH EVERY DAY 60 each 3   azithromycin (ZITHROMAX) 250 MG tablet Take 2 tablets (500 mg) on day one then 1 tablet (250 mg) daily for four days (Patient not taking: Reported on 06/18/2021) 6 tablet 0   BESIVANCE 0.6 % SUSP Place 1 drop into the left eye See admin instructions. Instill 1 drop into the left eye 4 times daily the day OF and day AFTER (eye injection) (Patient not taking: Reported on 06/18/2021)     doxycycline (VIBRA-TABS) 100 MG tablet Take 1 tablet (100 mg total) by mouth 2 (two) times daily. (Patient not taking: Reported on 06/18/2021) 14 tablet 0   predniSONE (DELTASONE) 10 MG tablet 4 tabs for 2 days, then 3 tabs for 2 days, 2 tabs for 2 days, then 1 tab for 2 days, then stop (Patient not taking: Reported on 06/18/2021) 20 tablet  0   promethazine-dextromethorphan (PROMETHAZINE-DM) 6.25-15 MG/5ML syrup Take 5 mLs by mouth at bedtime. (Patient not taking: Reported on 06/18/2021)     No facility-administered medications prior to visit.     Review of Systems:   Constitutional: No weight loss or gain, night sweats, fevers, chills, fatigue, or lassitude. HEENT: No headaches, difficulty swallowing, tooth/dental problems, or sore throat. No sneezing, itching, ear ache, nasal congestion, or post nasal drip CV:  +swelling in b/l lower extremities. No chest pain, orthopnea, PND, anasarca, dizziness, palpitations, syncope Resp: +shortness of breath with exertion; dry cough; chest congestion. No excess mucus or change in color of mucus. No hemoptysis. No wheezing.  No chest wall deformity GI:  No heartburn, indigestion, abdominal pain, nausea, vomiting, diarrhea, change in bowel habits, loss of appetite, bloody stools.  GU: No dysuria, change in color of urine, urgency or frequency.  No flank pain, no hematuria  Skin: No rash, lesions, ulcerations MSK:  No joint pain or swelling.  No decreased range of motion.  No back pain. Neuro: No dizziness or lightheadedness.  Psych: No depression or anxiety. Mood stable.   Observations/Objective: Patient is well-developed, chronically-ill appearing; in no acute distress. A&Ox3. Unlabored, regular breathing. On oxygen therapy with O2 sat 96% 3 lpm.  Resting comfortably at home. Speech is clear and coherent with logical content.   Assessment and Plan: Acute bronchitis with COPD ([place]) Minimal improvement in symptoms despite doxy, z pack and prednisone taper. Advised she come in for CXR and labs. Previous CXR 1/5 with b/l small pleural effusions. Concern for worsening given persistent symptoms and BLE edema. May consider short duration of lasix depending on BMET.   Patient Instructions  -Continue Trelegy 1 puff daily. Brush tongue and rinse mouth afterwards -Continue Albuterol inhaler 2  puffs or Duoneb 3 mL neb every 6 hours as needed for shortness of breath or wheezing. Notify if symptoms persist despite rescue inhaler/neb use. -Restart flonase nasal spray 2 sprays each nostril daily -Continue zyrtec 10 mg daily -Continue singulair 10 mg At bedtime -Continue omeprazole 20 mg At bedtime  -Continue promethazine-dextromethorphan cough syrup 5 mL At bedtime for cough -Continue supplemental oxygen at 2-3 lpm for oxygen saturation goal >88-90% -Continue mucinex 600 mg twice daily as needed until symptoms  improve   Notify if worsening breathlessness, cough, mucus production, fatigue, or wheezing occurs.  Maintain up to date vaccinations, including influenza, COVID, and pneumococcal.  Wash your hands often and avoid sick exposures.  Encouraged masking in crowds.  Avoid triggers, when possible.  Exercise, as tolerated. Notify if worsening symptoms upon exertion occur.   Advise to come into the office today for repeat chest x ray and labs. Will determine follow up and next steps after.     Chronic diastolic CHF (congestive heart failure) ([place]) Concern uncompensated given pleural effusions and BLE edema. BNP and BMET today. Repeat CXR to evaluate for pulmonary edema and pleural effusion status.   Chronic respiratory failure with hypoxia (HCC) Stable with no increasing requirements. Continue supplemental O2 2-3 lpm for goal SpO2 >88-90%    Follow Up Instructions: Follow up in one week with Dr. Lamonte Molina or Alanson Aly. If symptoms do not improve or worsen, please contact office for sooner follow up or seek emergency care.    I discussed the assessment and treatment plan with the patient. The patient was provided an opportunity to ask questions and all were answered. The patient agreed with the plan and demonstrated an understanding of the instructions.   The patient was advised to call back or seek an in-person evaluation if the symptoms worsen or if the condition fails to  improve as anticipated.  I provided 32 minutes of non-face-to-face time during this encounter.   Clayton Bibles, NP

## 2021-06-18 NOTE — Progress Notes (Signed)
Pt arrived to office around 1500 for labs and CXR. Focused assessment performed in x ray. Pleasant, interactive; in no acute distress. Breathing unlabored and regular. Scattered rhonchi posteriorly with minimal wheeze RLL. BLE +2 pitting edema. RRR, no m/r/g.

## 2021-06-18 NOTE — Assessment & Plan Note (Signed)
Stable with no increasing requirements. Continue supplemental O2 2-3 lpm for goal SpO2 >88-90%

## 2021-06-18 NOTE — Assessment & Plan Note (Signed)
Concern uncompensated given pleural effusions and BLE edema. BNP and BMET today. Repeat CXR to evaluate for pulmonary edema and pleural effusion status.

## 2021-06-19 ENCOUNTER — Other Ambulatory Visit: Payer: Self-pay | Admitting: *Deleted

## 2021-06-19 DIAGNOSIS — J810 Acute pulmonary edema: Secondary | ICD-10-CM

## 2021-06-19 NOTE — Progress Notes (Signed)
Seen yesterday for virtual visit and tx for pulmonary edema/fluid overload. Can you make sure she has follow up with [place] in one week with repeat BMET before? Thanks!

## 2021-06-19 NOTE — Progress Notes (Signed)
Called and spoke with patient, advised of need for f/u appointment in 1 week.  Schedule to see [place] on 06/27/21 at 2:30 pm, advised to arrive by 2:15 pm for check in and labs.  She verbalized understanding.  BMET ordered.  Nothing further needed.

## 2021-06-22 ENCOUNTER — Other Ambulatory Visit: Payer: Self-pay | Admitting: *Deleted

## 2021-06-22 NOTE — Patient Instructions (Signed)
Visit Information  Thank you for taking time to visit with me today. Please don't hesitate to contact me if I can be of assistance to you before our next scheduled telephone appointment.  Following are the goals we discussed today:   Take all medications as prescribed Attend all scheduled provider appointments Call pharmacy for medication refills 3-7 days in advance of running out of medications Call provider office for new concerns or questions

## 2021-06-22 NOTE — Patient Outreach (Addendum)
[facility]) Care Management Telephonic RN Care Manager Note   06/22/2021 Name:  [name] Molina MRN:  [id number] DOB:  1946/04/17   Recommendations/Changes made from today's visit: Documented with the plan of care  Subjective: [name] Molina is an 76 y.o. year old female who is a primary patient of Harlan Stains, MD. The care management team was consulted for assistance with care management and/or care coordination needs.    Telephonic RN Care Manager completed Telephone Visit today.  Objective:   Medications Reviewed Today     Reviewed by Tobi Bastos, RN (Registered Nurse) on 06/22/21 at 1327  Med List Status: <None>   Medication Order Taking? Sig Documenting Provider Last Dose Status Informant  acetaminophen (TYLENOL) 650 MG CR tablet [id number] Yes Take 1,300 mg by mouth every 8 (eight) hours as needed for pain. [provider] Taking Active Family Member  albuterol (VENTOLIN HFA) 108 (90 Base) MCG/ACT inhaler [id number] Yes Inhale 2 puffs into the lungs every 6 (six) hours as needed for wheezing or shortness of breath. Parrett, Fonnie Mu, NP Taking Active Family Member  apixaban (ELIQUIS) 5 MG TABS tablet [id number] Yes Take 1 tablet (5 mg total) by mouth 2 (two) times daily. Constance Haw, MD Taking Active   azelastine (ASTELIN) 0.1 % nasal spray [id number]  Place 2 sprays into both nostrils 2 (two) times daily. Use in each nostril as directed Cobb, Karie Schwalbe, NP  Active   azithromycin (ZITHROMAX) 250 MG tablet [id number] Yes Take 2 tablets (500 mg) on day one then 1 tablet (250 mg) daily for four days Clayton Bibles, NP Taking Active   BESIVANCE 0.6 % SUSP [id number] Yes Place 1 drop into the left eye See admin instructions. Instill 1 drop into the left eye 4 times daily the day OF and day AFTER (eye injection) [provider] Taking Active Family Member  cetirizine (ZYRTEC) 10 MG tablet [id number] Yes TAKE ONE TABLET BY MOUTH ONCE DAILY  Collene Gobble, MD Taking Active Family Member  Cyanocobalamin (VITAMIN B-12 PO) [id number] Yes Take 2,500 mcg by mouth daily. [provider] Taking Active Family Member  dextromethorphan-guaiFENesin [facility] DM) 30-600 MG 12hr tablet [id number] Yes Take 1 tablet by mouth 2 (two) times daily. [provider] Taking Active   diltiazem (CARDIZEM CD) 240 MG 24 hr capsule [id number]  Take 1 capsule (240 mg total) by mouth daily.  Patient taking differently: Take 240 mg by mouth daily. Taking this medication   Constance Haw, MD  Expired 06/18/21 2359 Family Member  doxycycline (VIBRA-TABS) 100 MG tablet [id number] No Take 1 tablet (100 mg total) by mouth 2 (two) times daily.  Patient not taking: Reported on 06/18/2021   Collene Gobble, MD Not Taking Active   DULoxetine (CYMBALTA) 60 MG capsule [id number] Yes Take 60 mg by mouth every morning. [provider] Taking Active Family Member  flecainide (TAMBOCOR) 50 MG tablet [id number]  Take 1 tablet (50 mg total) by mouth every 12 (twelve) hours. Constance Haw, MD  Expired 06/18/21 2359 Family Member  fluticasone (FLONASE) 50 MCG/ACT nasal spray [id number] Yes Place 1 spray into both nostrils daily.  Patient taking differently: Place 1 spray into both nostrils daily. Uses only when needed   Collene Gobble, MD Taking Active Family Member  furosemide (LASIX) 40 MG tablet [id number]  Take 1 tablet (40 mg total) by mouth daily. Donne Hazel, MD  Expired 06/18/21 2359 Family Member  furosemide (LASIX) 40  MG tablet [id number]  Take 1 tablet (40 mg total) by mouth daily for 3 days. Clayton Bibles, NP  Expired 06/21/21 2359   ipratropium-albuterol (DUONEB) 0.5-2.5 (3) MG/3ML SOLN [id number] Yes INHALE contents of ONE vial using nebulizer TWICE DAILY Byrum, Rose Fillers, MD Taking Active   LORazepam (ATIVAN) 0.5 MG tablet [id number] Yes Take 1-2 tablets (0.5-1 mg total) by mouth daily as needed for anxiety. Anxiety Amin, Jeanella Flattery, MD Taking Active   metoprolol succinate (TOPROL-XL) 50 MG 24 hr tablet [id number] Yes TAKE ONE TABLET BY MOUTH EVERYDAY AT BEDTIME Jerline Pain, MD Taking Active Family Member  montelukast (SINGULAIR) 10 MG tablet [id number] Yes Take 10 mg by mouth at bedtime. [provider] Taking Active Family Member           Med Note Wyatt Portela, Everlean Cherry May 22, 2014  4:20 PM)     Multiple Vitamins-Minerals (PRESERVISION AREDS 2+MULTI VIT) CAPS [id number] Yes Take 1 tablet by mouth 2 (two) times daily. [provider] Taking Active Family Member  omeprazole (PRILOSEC OTC) 20 MG tablet [id number] Yes Take 20 mg by mouth at bedtime. [provider] Taking Active Family Member  pravastatin (PRAVACHOL) 40 MG tablet [id number] Yes Take 40 mg by mouth at bedtime.  [provider] Taking Active Family Member  predniSONE (DELTASONE) 10 MG tablet [id number] No 4 tabs for 2 days, then 3 tabs for 2 days, 2 tabs for 2 days, then 1 tab for 2 days, then stop  Patient not taking: Reported on 06/18/2021   Clayton Bibles, NP Not Taking Active   promethazine-dextromethorphan (PROMETHAZINE-DM) 6.25-15 MG/5ML syrup [id number] No Take 5 mLs by mouth at bedtime.  Patient not taking: Reported on 06/18/2021   [provider] Not Taking Active Family Member  Donnal Debar 100-62.5-25 MCG/INH AEPB [id number] Yes INHALE 1 PUFF BY MOUTH EVERY DAY Byrum, Rose Fillers, MD Taking Active Family Member             SDOH:  (Social Determinants of Health) assessments and interventions performed:     Care Plan  Review of patient past medical history, allergies, medications, health status, including review of consultants reports, laboratory and other test data, was performed as part of comprehensive evaluation for care management services.   Care Plan : RN Care Manager plan of care  Updates made by Tobi Bastos, RN since 06/22/2021 12:00 AM     Problem: Knowledge deficit related to  falls/weakness/vertigo and care coordination needs   Priority: High     Long-Range Goal: Development plan of care for management of Falls/Weakness   Start Date: 04/23/2021  Expected End Date: 09/14/2021  This Visit's Progress: On track  Recent Progress: On track  Priority: High  Note:   Current Barriers:  Knowledge Deficits related to plan of care for management of Weakness/Falls prevention/Vertigo   RNCM Clinical Goal(s):  Patient will Knowledge Deficits related to plan of care for management of Weakness/Falls prevention/Vertigo through collaboration with RN Care manager, provider, and care team.   Interventions: Inter-disciplinary care team collaboration (see longitudinal plan of care) Evaluation of current treatment plan related to  self management and patient's adherence to plan as established by provider   Health Maintenance Interventions:  (Status:  Goal on track:  Yes.) Long Term Goal Knowledge Deficits related to plan of care for management of Weakness/Falls prevention/Vertigo    COPD Interventions:  (Status:  New goal.) Long Term Goal Provided patient with basic written and verbal COPD  education on self care/management/and exacerbation prevention Advised patient to track and manage COPD triggers Provided written and verbal instructions on pursed lip breathing and utilized returned demonstration as teach back Provided instruction about proper use of medications used for management of COPD including inhalers Provided education about and advised patient to utilize infection prevention strategies to reduce risk of respiratory infection   New (Jan): Reports some congestion with recent PCP office visit requiring doxycycline and prednisone ( awaiting pharmacy to deliver) however breathing without difficulty. Educated on the COPD action plan and what to do if acute symptoms occur. Will send COPD education information via AVS for reference and teach-back level of knowledge.  Accidentally "stumbled" with small injury of soreness but continue to request assistance from her support team when needed. No injuries requiring ED visit at this time.  2/3 Update: Pt reports issues with fluid between her chest wall and lungs-treated with Lasix-much better with minimal symptoms at this time. Pt will follow up on 2/8. Reports she completed of a recent "tele-o-visit", lab-work, Ex-rays, finished the prescribed antibiotics and currently taking Mucinex which is helping with her ongoing secretions. No other reported issues as pt continue to use her assistive devices for fall prevention and aware when vertigo symptoms and what to do if acute.  Patient Goals/Self-Care Activities: Take all medications as prescribed Attend all scheduled provider appointments Call pharmacy for medication refills 3-7 days in advance of running out of medications Call provider office for new concerns or questions   Follow Up Plan:  Telephone follow up appointment with care management team member scheduled for:  March 2023 The patient has been provided with contact information for the care management team and has been advised to call with any health related questions or concerns.         Raina Mina, RN Care Management Coordinator [place] Office [phone]

## 2021-06-24 ENCOUNTER — Other Ambulatory Visit: Payer: Self-pay | Admitting: Cardiology

## 2021-06-26 ENCOUNTER — Other Ambulatory Visit: Payer: Self-pay | Admitting: Nurse Practitioner

## 2021-06-26 DIAGNOSIS — R0982 Postnasal drip: Secondary | ICD-10-CM

## 2021-06-27 ENCOUNTER — Ambulatory Visit (INDEPENDENT_AMBULATORY_CARE_PROVIDER_SITE_OTHER): Payer: Medicare Other | Admitting: Nurse Practitioner

## 2021-06-27 ENCOUNTER — Ambulatory Visit (INDEPENDENT_AMBULATORY_CARE_PROVIDER_SITE_OTHER): Payer: Medicare Other

## 2021-06-27 ENCOUNTER — Other Ambulatory Visit: Payer: Self-pay

## 2021-06-27 ENCOUNTER — Encounter: Payer: Self-pay | Admitting: Nurse Practitioner

## 2021-06-27 VITALS — BP 110/78 | HR 88 | Temp 98.3°F | Ht 62.0 in | Wt 207.0 lb

## 2021-06-27 DIAGNOSIS — R0602 Shortness of breath: Secondary | ICD-10-CM | POA: Diagnosis not present

## 2021-06-27 DIAGNOSIS — J301 Allergic rhinitis due to pollen: Secondary | ICD-10-CM | POA: Diagnosis not present

## 2021-06-27 DIAGNOSIS — K219 Gastro-esophageal reflux disease without esophagitis: Secondary | ICD-10-CM | POA: Diagnosis not present

## 2021-06-27 DIAGNOSIS — R059 Cough, unspecified: Secondary | ICD-10-CM | POA: Diagnosis not present

## 2021-06-27 DIAGNOSIS — J810 Acute pulmonary edema: Secondary | ICD-10-CM

## 2021-06-27 DIAGNOSIS — J441 Chronic obstructive pulmonary disease with (acute) exacerbation: Secondary | ICD-10-CM | POA: Diagnosis not present

## 2021-06-27 DIAGNOSIS — J9611 Chronic respiratory failure with hypoxia: Secondary | ICD-10-CM

## 2021-06-27 DIAGNOSIS — I5032 Chronic diastolic (congestive) heart failure: Secondary | ICD-10-CM | POA: Diagnosis not present

## 2021-06-27 DIAGNOSIS — J45901 Unspecified asthma with (acute) exacerbation: Secondary | ICD-10-CM

## 2021-06-27 MED ORDER — BREZTRI AEROSPHERE 160-9-4.8 MCG/ACT IN AERO: 2.0000 | INHALATION_SPRAY | Freq: Two times a day (BID) | RESPIRATORY_TRACT | 0 refills | Status: DC

## 2021-06-27 MED ORDER — PANTOPRAZOLE SODIUM 40 MG PO TBEC: 40.0000 mg | DELAYED_RELEASE_TABLET | Freq: Every day | ORAL | 2 refills | Status: DC

## 2021-06-27 MED ORDER — METHYLPREDNISOLONE ACETATE 80 MG/ML IJ SUSP
80.0000 mg | Freq: Once | INTRAMUSCULAR | Status: AC
  Administered 2021-06-27: 80 mg via INTRAMUSCULAR

## 2021-06-27 MED ORDER — PREDNISONE 10 MG PO TABS: ORAL_TABLET | ORAL | 0 refills | Status: DC

## 2021-06-27 MED ORDER — IPRATROPIUM-ALBUTEROL 0.5-2.5 (3) MG/3ML IN SOLN
3.0000 mL | Freq: Once | RESPIRATORY_TRACT | Status: AC
  Administered 2021-06-27: 3 mL via RESPIRATORY_TRACT

## 2021-06-27 MED ORDER — CEFDINIR 300 MG PO CAPS: 300.0000 mg | ORAL_CAPSULE | Freq: Two times a day (BID) | ORAL | 0 refills | Status: AC

## 2021-06-27 NOTE — Patient Instructions (Addendum)
-  Stop Trelegy. Start Breztri 2 puffs Twice daily. Brush tongue and rinse mouth well after. Use with spacer.  -Continue Albuterol inhaler 2 puffs or Duoneb 3 mL neb every 6 hours as needed for shortness of breath or wheezing. Notify if symptoms persist despite rescue inhaler/neb use. -Restart flonase nasal spray 2 sprays each nostril daily -Continue zyrtec 10 mg daily -Continue singulair 10 mg At bedtime -Continue promethazine-dextromethorphan cough syrup 5 mL At bedtime for cough -Continue supplemental oxygen at 2-3 lpm for oxygen saturation goal >88-90% -Continue mucinex 600 mg twice daily as needed until symptoms improve -Continue lasix 40 mg daily. If swelling returns, follow up with cardiologist   -Stop omeprazole. Start Protonix 40 mg daily 30 min before breakfast.  -Prednisone taper. 4 tabs for 3 days, then 3 tabs for 3 days, 2 tabs for 3 days, then 1 tab for 3 days, then stop. Take in AM with food -Cefdinir 300 mg Twice daily for 7 days. Notify immediately of any rash, itching, hives, or swelling, or seek emergency care. Finish your antibiotics in their entirety. Do not stop just because symptoms improve.  Take with food to reduce GI upset.   Notify if worsening breathlessness, cough, mucus production, fatigue, or wheezing occurs.  Maintain up to date vaccinations, including influenza, COVID, and pneumococcal.  Wash your hands often and avoid sick exposures.  Encouraged masking in crowds.  Avoid triggers, when possible.  Exercise, as tolerated. Notify if worsening symptoms upon exertion occur.  Follow up in two weeks with Dr. Lamonte Sakai or Alanson Aly. If symptoms do not improve or worsen, please contact office for sooner follow up or seek emergency care.

## 2021-06-27 NOTE — Assessment & Plan Note (Signed)
Returned to lasix 40 mg daily. Resolved BLE swelling after 3 days of 40 Twice daily. Appears compensated on exam but will obtain CXR for re-eval of pleural effusions and previously noted opacities.

## 2021-06-27 NOTE — Progress Notes (Addendum)
@Patient  ID: [name] Molina, female    DOB: May 13, 1946, 76 y.o.   MRN: [id number]  Chief Complaint  Patient presents with   Follow-up    Referring provider: Harlan Stains, MD  HPI: 76 year old female, former smoker (70 pack years) followed for COPD/asthma overlap with emphysema, chronic respiratory failure, allergic rhinitis.  She is a patient [name] and was last seen via virtual visit on 06/18/2021.  Past medical history significant for hypertension, PAF on chronic anticoagulation therapy, chronic diastolic heart failure, OSA, GERD, dysphagia, peripheral neuropathy, chronic headaches, HLD, pulmonary nodule, MDD.  TEST/EVENTS:  11/16/2020 CTA chest: No PE. Atherosclerosis. Mild nonspecific mediastinal lymphadenopathy. Chronic lower lobe lung scarring and mild pulmonary gas trapping. Previously identified RUL nodule seen in 2019 resolved.  06/18/2021 CXR 2 View: Strandy opacities in lung bases, favoring atelectasis. Small pleural effusions b/l.  05/24/2021: OV with Dr. Lamonte Sakai.  Increased cough and shortness of breath.  CXR negative for pneumonia; did have bilateral small pleural effusions.  Treated with Doxy and prednisone taper.  Close follow-up with ED precautions due to risk for decompensation.  Continue Trelegy daily and albuterol as needed.  Continues on 2 to 3 L/min O2.  06/04/2021: Virtual visit with NP.  Persistent cough; improved shortness of breath.  Increased URI symptoms.  Completed Doxy with minimal relief.  Lost track of previously prescribed Pred taper so did not complete.  Rx prednisone taper and Z-Pak for atypical pathogens.  Continue Trelegy, Zyrtec and Singulair.  Advised to restart Flonase.  Astelin nasal spray Mucinex advised.  Continue supplemental O2.  06/18/2021: Virtual visit with Suzy Kugel NP.  Patient did come into the office after visit was completed for labs and chest x-ray.  She had worsening cough and some increase in shortness of breath.  She did note that she had pitting  edema in her lower extremities during the virtual visit, which was noted when examined in person.  O2 stable on 3 L/min.  BNP 92; CXR showed bilateral streaky opacities and small pleural effusions -> increase Lasix to 40 mg twice a day for 3 days.  06/27/2021: Today - follow up Patient presents today for follow up. She continues to have a congested cough that is now productive at times with green to yellow sputum, which is new. She did have some improvement in the severity of her cough after azithromycin course and previous prednisone taper. Her shortness of breath did improve with increase in lasix; however, has yet to return to her baseline. She did not notice much of an effect on her cough. Her lower extremity swelling has mostly resolved. She continues on 3 lpm POC supplemental O2 and continuous 2 lpm at home, without any increasing requirements. She denies any fevers, wheezing, orthopnea, PND, or chest pain. She has previously completed a course of doxy and azithromycin. She continues on Trelegy, singulair, zyrtec and omeprazole daily.  Allergies  Allergen Reactions   Food Shortness Of Breath    ALLERGY= MUSHROOMS   Levaquin [Levofloxacin In D5w] Other (See Comments)    "made me want to die."   Tequin Shortness Of Breath   Erythromycin Other (See Comments)    GI UPSET   Oxycodone-Acetaminophen Itching   Codeine Nausea And Vomiting   Penicillins Rash and Other (See Comments)    Reaction occurred during childhood   Prednisone Rash   Wellbutrin [Bupropion Hcl] Other (See Comments)    SHAKING    Immunization History  Administered Date(s) Administered   Fluad Quad(high Dose 65+) 02/08/2020  Influenza Split 02/27/2010, 02/18/2011, 01/20/2012, 02/01/2013, 02/17/2014, 03/04/2018, 03/20/2021   Influenza Whole 02/17/2017   Influenza, High Dose Seasonal PF 02/22/2011, 02/20/2012, 02/18/2013, 02/21/2014, 03/01/2015, 03/07/2016, 01/27/2017, 03/05/2018, 03/09/2019, 02/17/2020   Influenza,inj,Quad  PF,6+ Mos 02/20/2015, 02/18/2016   Janssen (J&J) SARS-COV-2 Vaccination 08/28/2019   Moderna SARS-COV2 Booster Vaccination 12/27/2020   Pneumococcal Conjugate-13 08/18/2013, 09/20/2013   Pneumococcal Polysaccharide-23 05/20/2006, 02/27/2010, 03/01/2015   Tdap 08/29/2011, 05/08/2017   Zoster Recombinat (Shingrix) 12/24/2017   Zoster, Live 05/21/2007, 09/02/2007, 12/24/2017, 03/27/2018    Past Medical History:  Diagnosis Date   Anxiety    Aortic atherosclerosis (HCC)    Arthritis    Asthma    Atrial tachycardia (HCC)    Cancer (HCC)    basal skin cancer on nose   Cataract    Chronic diastolic CHF (congestive heart failure) (HCC)    Complication of anesthesia    during cervical surgery she woke up during the procedure   COPD (chronic obstructive pulmonary disease) ([place])    Depression    Diverticulosis    Family history of adverse reaction to anesthesia    mother had post op N&V   GERD (gastroesophageal reflux disease)    History of hiatal hernia    Hyperlipidemia    Hypertension    Insomnia    Macular degeneration    Migraines    Neuropathy    Obesity    PAF (paroxysmal atrial fibrillation) (HCC)    Pneumonia    Sleep apnea    history of it,not using a cpap, study 2018: didn't need cpap   Thyroid nodule 2000   radioactive caspule    Tobacco History: Social History   Tobacco Use  Smoking Status Former   Packs/day: 1.00   Years: 20.00   Pack years: 20.00   Types: Cigarettes   Quit date: 05/21/1991   Years since quitting: 30.1  Smokeless Tobacco Never   Counseling given: Not Answered   Outpatient Medications Prior to Visit  Medication Sig Dispense Refill   acetaminophen (TYLENOL) 650 MG CR tablet Take 1,300 mg by mouth every 8 (eight) hours as needed for pain.     albuterol (VENTOLIN HFA) 108 (90 Base) MCG/ACT inhaler Inhale 2 puffs into the lungs every 6 (six) hours as needed for wheezing or shortness of breath. 1 each 6   apixaban (ELIQUIS) 5 MG TABS tablet  Take 1 tablet (5 mg total) by mouth 2 (two) times daily. 60 tablet 10   BESIVANCE 0.6 % SUSP Place 1 drop into the left eye See admin instructions. Instill 1 drop into the left eye 4 times daily the day OF and day AFTER (eye injection)     cetirizine (ZYRTEC) 10 MG tablet TAKE ONE TABLET BY MOUTH ONCE DAILY 30 tablet 4   Cyanocobalamin (VITAMIN B-12 PO) Take 2,500 mcg by mouth daily.     dextromethorphan-guaiFENesin (MUCINEX DM) 30-600 MG 12hr tablet Take 1 tablet by mouth 2 (two) times daily.     DULoxetine (CYMBALTA) 60 MG capsule Take 60 mg by mouth every morning.     flecainide (TAMBOCOR) 50 MG tablet TAKE ONE TABLET BY MOUTH AT BREAKFAST AND AT BEDTIME 60 tablet 9   fluticasone (FLONASE) 50 MCG/ACT nasal spray Place 1 spray into both nostrils daily. (Patient taking differently: Place 1 spray into both nostrils daily. Uses only when needed) 16 g 5   ipratropium-albuterol (DUONEB) 0.5-2.5 (3) MG/3ML SOLN INHALE contents of ONE vial using nebulizer TWICE DAILY 180 mL 1   LORazepam (ATIVAN) 0.5  MG tablet Take 1-2 tablets (0.5-1 mg total) by mouth daily as needed for anxiety. Anxiety 15 tablet 0   metoprolol succinate (TOPROL-XL) 50 MG 24 hr tablet TAKE ONE TABLET BY MOUTH EVERYDAY AT BEDTIME 90 tablet 2   montelukast (SINGULAIR) 10 MG tablet Take 10 mg by mouth at bedtime.     Multiple Vitamins-Minerals (PRESERVISION AREDS 2+MULTI VIT) CAPS Take 1 tablet by mouth 2 (two) times daily.     pravastatin (PRAVACHOL) 40 MG tablet Take 40 mg by mouth at bedtime.      predniSONE (DELTASONE) 10 MG tablet 4 tabs for 2 days, then 3 tabs for 2 days, 2 tabs for 2 days, then 1 tab for 2 days, then stop 20 tablet 0   promethazine-dextromethorphan (PROMETHAZINE-DM) 6.25-15 MG/5ML syrup Take 5 mLs by mouth at bedtime.     omeprazole (PRILOSEC OTC) 20 MG tablet Take 20 mg by mouth at bedtime.     TRELEGY ELLIPTA 100-62.5-25 MCG/INH AEPB INHALE 1 PUFF BY MOUTH EVERY DAY 60 each 3   diltiazem (CARDIZEM CD) 240 MG 24  hr capsule Take 1 capsule (240 mg total) by mouth daily. (Patient taking differently: Take 240 mg by mouth daily. Taking this medication) 30 capsule 6   furosemide (LASIX) 40 MG tablet Take 1 tablet (40 mg total) by mouth daily. 30 tablet 0   furosemide (LASIX) 40 MG tablet Take 1 tablet (40 mg total) by mouth daily for 3 days. 3 tablet 0   Azelastine HCl 137 MCG/SPRAY SOLN PLACE 2 SPRAYS INTO BOTH NOSTRILS 2 (TWO) TIMES DAILY. USE IN EACH NOSTRIL AS DIRECTED (Patient not taking: Reported on 06/27/2021) 30 mL 3   azithromycin (ZITHROMAX) 250 MG tablet Take 2 tablets (500 mg) on day one then 1 tablet (250 mg) daily for four days (Patient not taking: Reported on 06/27/2021) 6 tablet 0   doxycycline (VIBRA-TABS) 100 MG tablet Take 1 tablet (100 mg total) by mouth 2 (two) times daily. (Patient not taking: Reported on 06/27/2021) 14 tablet 0   No facility-administered medications prior to visit.     Review of Systems:   Constitutional: No weight loss or gain, night sweats, fevers, chills, fatigue, or lassitude. HEENT: No headaches, difficulty swallowing, tooth/dental problems, or sore throat. No sneezing, itching, ear ache, nasal congestion, or post nasal drip CV:  No chest pain, orthopnea, PND, swelling in lower extremities, anasarca, dizziness, palpitations, syncope Resp: +shortness of breath with exertion; congested, productive cough; occasional wheeze. No excess mucus or change in color of mucus. No hemoptysis.No chest wall deformity GI:  No heartburn, indigestion, abdominal pain, nausea, vomiting, diarrhea, change in bowel habits, loss of appetite, bloody stools.  GU: No dysuria, change in color of urine, urgency or frequency.  No flank pain, no hematuria  Skin: No rash, lesions, ulcerations MSK:  No joint pain or swelling.  No decreased range of motion.  No back pain. Neuro: No dizziness or lightheadedness.  Psych: No depression or anxiety. Mood stable.     Physical Exam:  BP 110/78 (BP  Location: Left Arm, Patient Position: Sitting, Cuff Size: Normal)    Pulse 88    Temp 98.3 F (36.8 C) (Oral)    Ht 5\' 2"  (1.575 m)    Wt 207 lb (93.9 kg)    SpO2 95%    BMI 37.86 kg/m   GEN: Pleasant, interactive, well-appearing; obese; in no acute distress. HEENT:  Normocephalic and atraumatic. EACs patent bilaterally. TM pearly gray with present light reflex bilaterally. PERRLA. Sclera  white. Nasal turbinates pink, moist and patent bilaterally. No rhinorrhea present. Oropharynx pink and moist, without exudate or edema. No lesions, ulcerations, or postnasal drip.  NECK:  Supple w/ fair ROM. No JVD present. Normal carotid impulses w/o bruits. Thyroid symmetrical with no goiter or nodules palpated. No lymphadenopathy.   CV: RRR, no m/r/g, no peripheral edema. Pulses intact, +2 bilaterally. No cyanosis, pallor or clubbing. PULMONARY:  Unlabored, regular breathing. Scattered rhonchi with minimal end expiratory wheeze bilaterally P. No accessory muscle use. No dullness to percussion. GI: BS present and normoactive. Soft, non-tender to palpation. No organomegaly or masses detected. No CVA tenderness. MSK: No erythema, warmth or tenderness. Cap refil <2 sec all extrem. No deformities or joint swelling noted.  Neuro: A/Ox3. No focal deficits noted.   Skin: Warm, no lesions or rashe Psych: Normal affect and behavior. Judgement and thought content appropriate.     Lab Results:  CBC    Component Value Date/Time   WBC 7.1 03/09/2021 0408   RBC 4.25 03/09/2021 0408   HGB 11.4 (L) 03/09/2021 0408   HGB 14.2 11/01/2020 1506   HCT 36.0 03/09/2021 0408   HCT 42.3 11/01/2020 1506   PLT 135 (L) 03/09/2021 0408   PLT 208 11/01/2020 1506   MCV 84.7 03/09/2021 0408   MCV 83 11/01/2020 1506   MCH 26.8 03/09/2021 0408   MCHC 31.7 03/09/2021 0408   RDW 15.9 (H) 03/09/2021 0408   RDW 13.6 11/01/2020 1506   LYMPHSABS 0.6 (L) 03/09/2021 0408   MONOABS 0.8 03/09/2021 0408   EOSABS 0.1 03/09/2021 0408    BASOSABS 0.0 03/09/2021 0408    BMET    Component Value Date/Time   NA 143 06/18/2021 1502   NA 144 11/23/2020 0951   K 4.5 06/18/2021 1502   CL 103 06/18/2021 1502   CO2 31 06/18/2021 1502   GLUCOSE 114 (H) 06/18/2021 1502   BUN 11 06/18/2021 1502   BUN 15 11/23/2020 0951   CREATININE 0.77 06/18/2021 1502   CALCIUM 9.5 06/18/2021 1502   GFRNONAA >60 03/13/2021 1439   GFRAA 69 03/08/2020 1517    BNP    Component Value Date/Time   BNP 44.8 03/15/2021 0501     Imaging:  06/27/2021: CXR reviewed by me. Persistent right basilar opacity at RL base, suspect scarring given presence on previous studies. Previous consolidative changes noted in this area in October '22, which could explain findings. Central of this area with more nodular appearing density measuring up to 1.5 cm  DG Chest 2 View  Result Date: 06/27/2021 CLINICAL DATA:  Shortness of breath and cough in a 76 year old female. EXAM: CHEST - 2 VIEW COMPARISON:  June 18, 2021. FINDINGS: Cardiomediastinal contours and hilar structures are stable. Persistent basilar opacity at the RIGHT lung base appears more conspicuous than on priors from earlier in January but similar to the most recent prior. Central area on the frontal view of more pronounced density measuring up to 1.5 cm. No sign of lobar consolidation. No pneumothorax. No gross pleural effusion. On limited assessment there is no acute skeletal process. IMPRESSION: Persistent RIGHT basilar opacity favored to represent scarring given presence on multiple prior studies. Consolidative changes were noted in this area on previous imaging from October of 2022 which may explain these findings. There is a central area of more nodular appearing density measuring up to 1.5 cm, not allowing for exclusion of a coexistent nodule. Consider 6-8 week follow-up with chest CT to document new baseline and exclude this possibility.  Electronically Signed   By: Zetta Bills M.D.   On: 06/27/2021  15:51   DG Chest 2 View  Result Date: 06/18/2021 CLINICAL DATA:  SOB; pleural effusions EXAM: CHEST - 2 VIEW COMPARISON:  05/24/2021. FINDINGS: Similar small bilateral pleural effusions. Similar streaky opacities at the right greater than left lung bases. No visible pneumothorax. No evidence of acute osseous abnormality. Polyarticular degenerative change. Partially imaged cervical ACDF. IMPRESSION: Similar small bilateral pleural effusions and overlying streaky opacities. Favor atelectasis/scarring, although infection is not excluded. Electronically Signed   By: Margaretha Sheffield M.D.   On: 06/18/2021 15:33    ipratropium-albuterol (DUONEB) 0.5-2.5 (3) MG/3ML nebulizer solution 3 mL     Date Action Dose Route User   06/27/2021 1512 Given 3 mL Nebulization Mabe, Beatris Ship, CMA      methylPREDNISolone acetate (DEPO-MEDROL) injection 80 mg     Date Action Dose Route User   06/27/2021 1511 Given 80 mg Intramuscular (Right Upper Outer Quadrant) Mabe, Beatris Ship, CMA       PFT Results Latest Ref Rng & Units 01/15/2019  FVC-Pre L 2.17  FVC-Predicted Pre % 76  FVC-Post L 2.21  FVC-Predicted Post % 78  Pre FEV1/FVC % % 56  Post FEV1/FCV % % 59  FEV1-Pre L 1.22  FEV1-Predicted Pre % 57  FEV1-Post L 1.30  DLCO uncorrected ml/min/mmHg 16.18  DLCO [facility]% % 85  DLVA Predicted % 95  TLC L 4.84  TLC % Predicted % 96  RV % Predicted % 118    No results found for: NITRICOXIDE      Assessment & Plan:   Acute exacerbation of COPD with asthma (HCC) Persistent congested cough, now productive with green to yellow sputum. SOB slightly improved after lasix increase but not at baseline. No increasing oxygen requirements. Scattered rhonchi and wheeze - depo 80 x 1 and neb in office. CXR today showed resolution of pleural effusions but persistent RLL opacity with central area of more nodular appearing density measuring up to 1.5 cm. Sputum culture ordered. Start Cefdinir Twice daily for 7 days after  obtaining sputum culture. Will follow up with CT chest in 6 weeks. Trial change from Trelegy to [facility] for possible upper airway irritation. Optimize PPI therapy.   Patient Instructions  -Stop Trelegy. Start Breztri 2 puffs Twice daily. Brush tongue and rinse mouth well after. Use with spacer.  -Continue Albuterol inhaler 2 puffs or Duoneb 3 mL neb every 6 hours as needed for shortness of breath or wheezing. Notify if symptoms persist despite rescue inhaler/neb use. -Restart flonase nasal spray 2 sprays each nostril daily -Continue zyrtec 10 mg daily -Continue singulair 10 mg At bedtime -Continue promethazine-dextromethorphan cough syrup 5 mL At bedtime for cough -Continue supplemental oxygen at 2-3 lpm for oxygen saturation goal >88-90% -Continue mucinex 600 mg twice daily as needed until symptoms improve -Continue lasix 40 mg daily. If swelling returns, follow up with cardiologist   -Stop omeprazole. Start Protonix 40 mg daily 30 min before breakfast.  -Prednisone taper. 4 tabs for 3 days, then 3 tabs for 3 days, 2 tabs for 3 days, then 1 tab for 3 days, then stop. Take in AM with food -Cefdinir 300 mg Twice daily for 7 days. Notify immediately of any rash, itching, hives, or swelling, or seek emergency care. Finish your antibiotics in their entirety. Do not stop just because symptoms improve.  Take with food to reduce GI upset.   Notify if worsening breathlessness, cough, mucus production, fatigue, or wheezing  occurs.  Maintain up to date vaccinations, including influenza, COVID, and pneumococcal.  Wash your hands often and avoid sick exposures.  Encouraged masking in crowds.  Avoid triggers, when possible.  Exercise, as tolerated. Notify if worsening symptoms upon exertion occur.  Follow up in two weeks with Dr. Lamonte Sakai or Alanson Aly. If symptoms do not improve or worsen, please contact office for sooner follow up or seek emergency care.    Allergic rhinitis Stable on current  regimen.   Chronic diastolic CHF (congestive heart failure) (HCC) Returned to lasix 40 mg daily. Resolved BLE swelling after 3 days of 40 Twice daily. Appears compensated on exam but will obtain CXR for re-eval of pleural effusions and previously noted opacities.   Chronic respiratory failure with hypoxia (HCC) Continue supplemental O2 at 2-3 lpm for goal SpO2 >88-90%. Stable in office.    Clayton Bibles, NP 06/28/2021  Pt aware and understands NP's role.

## 2021-06-27 NOTE — Assessment & Plan Note (Signed)
Stable on current regimen   

## 2021-06-27 NOTE — Assessment & Plan Note (Addendum)
Persistent congested cough, now productive with green to yellow sputum. SOB slightly improved after lasix increase but not at baseline. No increasing oxygen requirements. Scattered rhonchi and wheeze - depo 80 x 1 and neb in office. CXR today showed resolution of pleural effusions but persistent RLL opacity with central area of more nodular appearing density measuring up to 1.5 cm. Sputum culture ordered. Start Cefdinir Twice daily for 7 days after obtaining sputum culture. Will follow up with CT chest in 6 weeks. Trial change from Trelegy to [facility] for possible upper airway irritation. Optimize PPI therapy.   Patient Instructions  -Stop Trelegy. Start Breztri 2 puffs Twice daily. Brush tongue and rinse mouth well after. Use with spacer.  -Continue Albuterol inhaler 2 puffs or Duoneb 3 mL neb every 6 hours as needed for shortness of breath or wheezing. Notify if symptoms persist despite rescue inhaler/neb use. -Restart flonase nasal spray 2 sprays each nostril daily -Continue zyrtec 10 mg daily -Continue singulair 10 mg At bedtime -Continue promethazine-dextromethorphan cough syrup 5 mL At bedtime for cough -Continue supplemental oxygen at 2-3 lpm for oxygen saturation goal >88-90% -Continue mucinex 600 mg twice daily as needed until symptoms improve -Continue lasix 40 mg daily. If swelling returns, follow up with cardiologist   -Stop omeprazole. Start Protonix 40 mg daily 30 min before breakfast.  -Prednisone taper. 4 tabs for 3 days, then 3 tabs for 3 days, 2 tabs for 3 days, then 1 tab for 3 days, then stop. Take in AM with food -Cefdinir 300 mg Twice daily for 7 days. Notify immediately of any rash, itching, hives, or swelling, or seek emergency care. Finish your antibiotics in their entirety. Do not stop just because symptoms improve.  Take with food to reduce GI upset.   Notify if worsening breathlessness, cough, mucus production, fatigue, or wheezing occurs.  Maintain up to date  vaccinations, including influenza, COVID, and pneumococcal.  Wash your hands often and avoid sick exposures.  Encouraged masking in crowds.  Avoid triggers, when possible.  Exercise, as tolerated. Notify if worsening symptoms upon exertion occur.  Follow up in two weeks with Dr. Lamonte Sakai or Alanson Aly. If symptoms do not improve or worsen, please contact office for sooner follow up or seek emergency care.

## 2021-06-27 NOTE — Progress Notes (Signed)
Discussed CXR findings with patient. Advise she start the Cefdinir after obtaining sputum culture, given the new more nodular appearing density. Will follow up with CT chest. Verbalized understanding.

## 2021-06-27 NOTE — Assessment & Plan Note (Signed)
Continue supplemental O2 at 2-3 lpm for goal SpO2 >88-90%. Stable in office.

## 2021-06-28 ENCOUNTER — Telehealth: Payer: Self-pay | Admitting: Nurse Practitioner

## 2021-06-28 LAB — BASIC METABOLIC PANEL
BUN: 22 mg/dL (ref 6–23)
CO2: 30 mEq/L (ref 19–32)
Calcium: 9.3 mg/dL (ref 8.4–10.5)
Chloride: 103 mEq/L (ref 96–112)
Creatinine, Ser: 1.08 mg/dL (ref 0.40–1.20)
GFR: 49.69 mL/min — ABNORMAL LOW (ref >60.00)
Glucose, Bld: 190 mg/dL — ABNORMAL HIGH (ref 70–99)
Potassium: 3.8 mEq/L (ref 3.5–5.1)
Sodium: 144 mEq/L (ref 135–145)

## 2021-06-28 MED ORDER — PROMETHAZINE-DM 6.25-15 MG/5ML PO SYRP: 5.0000 mL | ORAL_SOLUTION | Freq: Every day | ORAL | 0 refills | Status: DC

## 2021-06-28 NOTE — Telephone Encounter (Signed)
Patient was seen in office yesterday and requested refill from Promethazine cough syrup. I forgot to mention it to the provider.  Katie please advise

## 2021-06-28 NOTE — Addendum Note (Signed)
Addended by: Clayton Bibles on: 06/28/2021 01:35 PM   Modules accepted: Orders

## 2021-06-28 NOTE — Telephone Encounter (Signed)
Called and spoke with patient to let her know that Joellen Jersey has sent in RX. Patient expressed understanding. Nothing further needed at this time.

## 2021-06-28 NOTE — Progress Notes (Signed)
LMOM for patient to call office back. Call back information provided.

## 2021-06-28 NOTE — Progress Notes (Signed)
BMET with decrease in GFR. Creatinine and BUN are nl so we will just continue to monitor her kidney function. She has returned to her baseline lasix dose. Thanks.

## 2021-06-28 NOTE — Telephone Encounter (Signed)
Ok to refill once. Thanks.

## 2021-06-29 ENCOUNTER — Other Ambulatory Visit: Payer: Medicare Other

## 2021-06-29 ENCOUNTER — Telehealth: Payer: Self-pay | Admitting: Nurse Practitioner

## 2021-06-29 DIAGNOSIS — J9611 Chronic respiratory failure with hypoxia: Secondary | ICD-10-CM

## 2021-06-29 DIAGNOSIS — J301 Allergic rhinitis due to pollen: Secondary | ICD-10-CM

## 2021-06-29 NOTE — Telephone Encounter (Signed)
Marland Kitchen V, NP  06/28/2021 12:25 PM EST     BMET with decrease in GFR. Creatinine and BUN are nl so we will just continue to monitor her kidney function. She has returned to her baseline lasix dose. Thanks      Called and spoke with pt letting her know the results of labwork and she verbalized understanding. Nothing further needed.

## 2021-06-29 NOTE — Addendum Note (Signed)
Addended by: Dessie Coma on: 06/29/2021 09:42 AM   Modules accepted: Orders

## 2021-07-02 LAB — RESPIRATORY CULTURE OR RESPIRATORY AND SPUTUM CULTURE
MICRO NUMBER:: 12993131
SPECIMEN QUALITY:: ADEQUATE

## 2021-07-03 NOTE — Progress Notes (Signed)
No specific organism identified in sputum culture. Continue with regimen as previously prescribed and follow up as scheduled.

## 2021-07-04 DIAGNOSIS — J441 Chronic obstructive pulmonary disease with (acute) exacerbation: Secondary | ICD-10-CM | POA: Diagnosis not present

## 2021-07-04 DIAGNOSIS — I10 Essential (primary) hypertension: Secondary | ICD-10-CM | POA: Diagnosis not present

## 2021-07-04 DIAGNOSIS — I5033 Acute on chronic diastolic (congestive) heart failure: Secondary | ICD-10-CM | POA: Diagnosis not present

## 2021-07-04 DIAGNOSIS — F329 Major depressive disorder, single episode, unspecified: Secondary | ICD-10-CM | POA: Diagnosis not present

## 2021-07-05 ENCOUNTER — Telehealth: Payer: Self-pay | Admitting: Emergency Medicine

## 2021-07-06 NOTE — Telephone Encounter (Signed)
Lm for patient.  

## 2021-07-06 NOTE — Telephone Encounter (Signed)
Pt was made aware of sputum culture results (see lab result encounter). Nothing further needed.

## 2021-07-11 ENCOUNTER — Ambulatory Visit (INDEPENDENT_AMBULATORY_CARE_PROVIDER_SITE_OTHER): Payer: Medicare Other | Admitting: Nurse Practitioner

## 2021-07-11 ENCOUNTER — Encounter: Payer: Self-pay | Admitting: Nurse Practitioner

## 2021-07-11 ENCOUNTER — Other Ambulatory Visit: Payer: Self-pay

## 2021-07-11 VITALS — BP 118/70 | HR 67 | Temp 98.1°F | Ht 62.0 in | Wt 211.8 lb

## 2021-07-11 DIAGNOSIS — I5032 Chronic diastolic (congestive) heart failure: Secondary | ICD-10-CM

## 2021-07-11 DIAGNOSIS — I480 Paroxysmal atrial fibrillation: Secondary | ICD-10-CM

## 2021-07-11 DIAGNOSIS — J301 Allergic rhinitis due to pollen: Secondary | ICD-10-CM | POA: Diagnosis not present

## 2021-07-11 DIAGNOSIS — R911 Solitary pulmonary nodule: Secondary | ICD-10-CM | POA: Diagnosis not present

## 2021-07-11 DIAGNOSIS — J9611 Chronic respiratory failure with hypoxia: Secondary | ICD-10-CM

## 2021-07-11 DIAGNOSIS — K219 Gastro-esophageal reflux disease without esophagitis: Secondary | ICD-10-CM

## 2021-07-11 DIAGNOSIS — J449 Chronic obstructive pulmonary disease, unspecified: Secondary | ICD-10-CM

## 2021-07-11 MED ORDER — PANTOPRAZOLE SODIUM 40 MG PO TBEC: 40.0000 mg | DELAYED_RELEASE_TABLET | Freq: Every day | ORAL | 1 refills | Status: DC

## 2021-07-11 MED ORDER — BREZTRI AEROSPHERE 160-9-4.8 MCG/ACT IN AERO: 2.0000 | INHALATION_SPRAY | Freq: Two times a day (BID) | RESPIRATORY_TRACT | 6 refills | Status: DC

## 2021-07-11 NOTE — Assessment & Plan Note (Signed)
Well-controlled on current regimen. ?

## 2021-07-11 NOTE — Progress Notes (Signed)
@Patient  ID: [name] Molina, female    DOB: 04-Jun-1945, 76 y.o.   MRN: [id number]  Chief Complaint  Patient presents with   Follow-up    She reports that she is feeling much better.     Referring provider: Harlan Stains, MD  HPI: 76 year old female, former smoker (70 pack years) followed for COPD/asthma overlap with emphysema, chronic respiratory failure, allergic rhinitis.  She is a patient of [name] and was last seen in office on 06/27/2021.  Past medical history significant for hypertension, PAF on chronic anticoagulation therapy, chronic diastolic heart failure, OSA, GERD, dysphagia, peripheral neuropathy, chronic headaches, HLD, pulmonary nodule, MDD.  TEST/EVENTS:  11/16/2020 CTA chest: No PE.  Atherosclerosis.  Mild nonspecific mediastinal lymphadenopathy.  Chronic lower lobe scarring and mild pulmonary gas trapping.  Previous identified right upper lobe nodule seen in 2019 resolved. 06/18/2021 CXR 2 view: Strandy opacities in lung bases, favoring atelectasis.  Small pleural effusions bilaterally.  Suspect related to fluid overload.  05/24/2021: OV with Dr. Lamonte Sakai.  Increased cough and shortness of breath.  CXR negative for pneumonia; did have bilateral small pleural effusions.  Treated with Doxy and prednisone taper.  Close follow-up with ED precautions due to risk for decompensation.  Continue Trelegy daily and albuterol as needed.  Continued on 2 to 3 L/min supplemental O2.  06/04/2021: Virtual visit with NP for follow-up.  Persistent cough with improved shortness of breath.  Increased URI symptoms.  Completed Doxy with minimal relief.  Lost track of previously prescribed Pred taper so did not complete.  Rx for prednisone taper and Z-Pak for atypical pathogens.  Continue Trelegy, Zyrtec and Singulair.  Advised to restart Flonase.  Astelin nasal spray and Mucinex advised.  Continued supplemental O2.  06/18/2021: Virtual visit with Burdell Peed NP.  Patient was requested to come to the office for  chest x-ray and labs given worsening cough and increased shortness of breath.  Also had noted some pitting edema in her lower extremities during the virtual visit.  BNP was normal.  CXR showed bilateral streaky opacities and small pleural effusions-increase Lasix to 40 mg twice a day for 3 days.  06/27/2021: OV with Herta Hink NP for follow-up.  Continues to have a congested cough that is not productive at times with green-yellow sputum, which is new.  Did have some improvement of the severity of her cough after azithromycin course and previous prednisone taper.  Shortness of breath and bilateral lower extremity edema improved with increased Lasix.  CXR showed persistent right basilar opacity at right lobe base, suspected to be scarring given presence on previous studies; however there was a more nodular appearing density within the opacity measuring up to 1.5 cm.  Sputum culture ordered w/o bacterial pathogen. Tx with cefdinir for 7 days. Plans for repeat CT chest in 6 weeks. Optimized PPI therapy and postnasal drip control. Changed from Trelegy to [facility] to minimize upper airway irritation.   07/11/2021: Today - follow up Patient presents today for follow up with friend. She has noticed significant improvement in her breathing and reports that her cough has mostly resolved. She will rarely cough a very small amount of clear to white sputum up after her breathing treatments. She has not had any further episodes of BLE edema. She is able to lay back in her recliner again without any breathing difficulties. She continues on [place] Twice daily and has been using her nebs 1-2 times a day, as discussed at her previous visit. She continues on flonase, zyrtec, and  singulair. She is taking her lasix 40 mg daily. She has had no increasing oxygen requirements and remains on 2-3 lpm.  Allergies  Allergen Reactions   Food Shortness Of Breath    ALLERGY= MUSHROOMS   Levaquin [Levofloxacin In D5w] Other (See Comments)     "made me want to die."   Tequin Shortness Of Breath   Erythromycin Other (See Comments)    GI UPSET   Oxycodone-Acetaminophen Itching   Codeine Nausea And Vomiting   Penicillins Rash and Other (See Comments)    Reaction occurred during childhood   Prednisone Rash   Wellbutrin [Bupropion Hcl] Other (See Comments)    SHAKING    Immunization History  Administered Date(s) Administered   Fluad Quad(high Dose 65+) 02/08/2020   Influenza Split 02/27/2010, 02/18/2011, 01/20/2012, 02/01/2013, 02/17/2014, 03/04/2018, 03/20/2021   Influenza Whole 02/17/2017   Influenza, High Dose Seasonal PF 02/22/2011, 02/20/2012, 02/18/2013, 02/21/2014, 03/01/2015, 03/07/2016, 01/27/2017, 03/05/2018, 03/09/2019, 02/17/2020   Influenza,inj,Quad PF,6+ Mos 02/20/2015, 02/18/2016   Janssen (J&J) SARS-COV-2 Vaccination 08/28/2019   Moderna SARS-COV2 Booster Vaccination 12/27/2020   Pneumococcal Conjugate-13 08/18/2013, 09/20/2013   Pneumococcal Polysaccharide-23 05/20/2006, 02/27/2010, 03/01/2015   Tdap 08/29/2011, 05/08/2017   Zoster Recombinat (Shingrix) 12/24/2017   Zoster, Live 05/21/2007, 09/02/2007, 12/24/2017, 03/27/2018    Past Medical History:  Diagnosis Date   Anxiety    Aortic atherosclerosis (HCC)    Arthritis    Asthma    Atrial tachycardia ([place])    Cancer (HCC)    basal skin cancer on nose   Cataract    Chronic diastolic CHF (congestive heart failure) (HCC)    Complication of anesthesia    during cervical surgery she woke up during the procedure   COPD (chronic obstructive pulmonary disease) ([place])    Depression    Diverticulosis    Family history of adverse reaction to anesthesia    mother had post op N&V   GERD (gastroesophageal reflux disease)    History of hiatal hernia    Hyperlipidemia    Hypertension    Insomnia    Macular degeneration    Migraines    Neuropathy    Obesity    PAF (paroxysmal atrial fibrillation) (HCC)    Pneumonia    Sleep apnea    history of it,not  using a cpap, study 2018: didn't need cpap   Thyroid nodule 2000   radioactive caspule    Tobacco History: Social History   Tobacco Use  Smoking Status Former   Packs/day: 1.00   Years: 20.00   Pack years: 20.00   Types: Cigarettes   Quit date: 05/21/1991   Years since quitting: 30.1  Smokeless Tobacco Never   Counseling given: Not Answered   Outpatient Medications Prior to Visit  Medication Sig Dispense Refill   acetaminophen (TYLENOL) 650 MG CR tablet Take 1,300 mg by mouth every 8 (eight) hours as needed for pain.     albuterol (VENTOLIN HFA) 108 (90 Base) MCG/ACT inhaler Inhale 2 puffs into the lungs every 6 (six) hours as needed for wheezing or shortness of breath. 1 each 6   apixaban (ELIQUIS) 5 MG TABS tablet Take 1 tablet (5 mg total) by mouth 2 (two) times daily. 60 tablet 10   BESIVANCE 0.6 % SUSP Place 1 drop into the left eye See admin instructions. Instill 1 drop into the left eye 4 times daily the day OF and day AFTER (eye injection)     cetirizine (ZYRTEC) 10 MG tablet TAKE ONE TABLET BY MOUTH ONCE DAILY  30 tablet 4   Cyanocobalamin (VITAMIN B-12 PO) Take 2,500 mcg by mouth daily.     DULoxetine (CYMBALTA) 60 MG capsule Take 60 mg by mouth every morning.     flecainide (TAMBOCOR) 50 MG tablet TAKE ONE TABLET BY MOUTH AT BREAKFAST AND AT BEDTIME 60 tablet 9   fluticasone (FLONASE) 50 MCG/ACT nasal spray Place 1 spray into both nostrils daily. (Patient taking differently: Place 1 spray into both nostrils daily. Uses only when needed) 16 g 5   ipratropium-albuterol (DUONEB) 0.5-2.5 (3) MG/3ML SOLN INHALE contents of ONE vial using nebulizer TWICE DAILY 180 mL 1   LORazepam (ATIVAN) 0.5 MG tablet Take 1-2 tablets (0.5-1 mg total) by mouth daily as needed for anxiety. Anxiety 15 tablet 0   metoprolol succinate (TOPROL-XL) 50 MG 24 hr tablet TAKE ONE TABLET BY MOUTH EVERYDAY AT BEDTIME 90 tablet 2   montelukast (SINGULAIR) 10 MG tablet Take 10 mg by mouth at bedtime.      Multiple Vitamins-Minerals (PRESERVISION AREDS 2+MULTI VIT) CAPS Take 1 tablet by mouth 2 (two) times daily.     pravastatin (PRAVACHOL) 40 MG tablet Take 40 mg by mouth at bedtime.      Budeson-Glycopyrrol-Formoterol (BREZTRI AEROSPHERE) 160-9-4.8 MCG/ACT AERO Inhale 2 puffs into the lungs in the morning and at bedtime. 10.7 g 0   pantoprazole (PROTONIX) 40 MG tablet Take 1 tablet (40 mg total) by mouth daily. 30 tablet 2   diltiazem (CARDIZEM CD) 240 MG 24 hr capsule Take 1 capsule (240 mg total) by mouth daily. (Patient taking differently: Take 240 mg by mouth daily. Taking this medication) 30 capsule 6   furosemide (LASIX) 40 MG tablet Take 1 tablet (40 mg total) by mouth daily. 30 tablet 0   furosemide (LASIX) 40 MG tablet Take 1 tablet (40 mg total) by mouth daily for 3 days. 3 tablet 0   predniSONE (DELTASONE) 10 MG tablet 4 tabs for 2 days, then 3 tabs for 2 days, 2 tabs for 2 days, then 1 tab for 2 days, then stop (Patient not taking: Reported on 07/11/2021) 20 tablet 0   predniSONE (DELTASONE) 10 MG tablet 4 tabs for 3 days, then 3 tabs for 3 days, 2 tabs for 3 days, then 1 tab for 3 days, then stop (Patient not taking: Reported on 07/11/2021) 30 tablet 0   promethazine-dextromethorphan (PROMETHAZINE-DM) 6.25-15 MG/5ML syrup Take 5 mLs by mouth at bedtime. (Patient not taking: Reported on 07/11/2021) 118 mL 0   No facility-administered medications prior to visit.     Review of Systems:   Constitutional: No weight loss or gain, night sweats, fevers, chills, fatigue, or lassitude. HEENT: No headaches, difficulty swallowing, tooth/dental problems, or sore throat. No sneezing, itching, ear ache, nasal congestion, or post nasal drip CV:  No chest pain, orthopnea, PND, swelling in lower extremities, anasarca, dizziness, palpitations, syncope Resp: +shortness of breath with strenuous activity (at baseline); rare, minimally productive cough. No excess mucus or change in color of mucus.No  hemoptysis. No wheezing.  No chest wall deformity GI:  No heartburn, indigestion, abdominal pain, nausea, vomiting, diarrhea, change in bowel habits, loss of appetite, bloody stools.  GU: No dysuria, change in color of urine, urgency or frequency.  No flank pain, no hematuria  Skin: No rash, lesions, ulcerations MSK:  No joint pain or swelling.  No decreased range of motion.  No back pain. Neuro: No dizziness or lightheadedness.  Psych: No depression or anxiety. Mood stable.     Physical Exam:  BP 118/70 (BP Location: Left Arm, Patient Position: Sitting, Cuff Size: Normal)    Pulse 67    Temp 98.1 F (36.7 C) (Oral)    Ht 5\' 2"  (1.575 m)    Wt 211 lb 12.8 oz (96.1 kg)    SpO2 97%    BMI 38.74 kg/m   GEN: Pleasant, interactive, chronically-ill appearing; obese; in no acute distress. HEENT:  Normocephalic and atraumatic. EACs patent bilaterally. TM pearly gray with present light reflex bilaterally. PERRLA. Sclera white. Nasal turbinates pink, moist and patent bilaterally. No rhinorrhea present. Oropharynx pink and moist, without exudate or edema. No lesions, ulcerations, or postnasal drip.  NECK:  Supple w/ fair ROM. No JVD present. Normal carotid impulses w/o bruits. Thyroid symmetrical with no goiter or nodules palpated. No lymphadenopathy.   CV: RRR, no m/r/g, no peripheral edema. Pulses intact, +2 bilaterally. No cyanosis, pallor or clubbing. PULMONARY:  Unlabored, regular breathing. Clear bilaterally A&P w/o wheezes/rales/rhonchi. No accessory muscle use. No dullness to percussion. GI: BS present and normoactive. Soft, non-tender to palpation. No organomegaly or masses detected. No CVA tenderness. MSK: No erythema, warmth or tenderness. Cap refil <2 sec all extrem. No deformities or joint swelling noted.  Neuro: A/Ox3. No focal deficits noted.   Skin: Warm, no lesions or rashe Psych: Normal affect and behavior. Judgement and thought content appropriate.     Lab Results:  CBC     Component Value Date/Time   WBC 7.1 03/09/2021 0408   RBC 4.25 03/09/2021 0408   HGB 11.4 (L) 03/09/2021 0408   HGB 14.2 11/01/2020 1506   HCT 36.0 03/09/2021 0408   HCT 42.3 11/01/2020 1506   PLT 135 (L) 03/09/2021 0408   PLT 208 11/01/2020 1506   MCV 84.7 03/09/2021 0408   MCV 83 11/01/2020 1506   MCH 26.8 03/09/2021 0408   MCHC 31.7 03/09/2021 0408   RDW 15.9 (H) 03/09/2021 0408   RDW 13.6 11/01/2020 1506   LYMPHSABS 0.6 (L) 03/09/2021 0408   MONOABS 0.8 03/09/2021 0408   EOSABS 0.1 03/09/2021 0408   BASOSABS 0.0 03/09/2021 0408    BMET    Component Value Date/Time   NA 144 06/27/2021 1439   NA 144 11/23/2020 0951   K 3.8 06/27/2021 1439   CL 103 06/27/2021 1439   CO2 30 06/27/2021 1439   GLUCOSE 190 (H) 06/27/2021 1439   BUN 22 06/27/2021 1439   BUN 15 11/23/2020 0951   CREATININE 1.08 06/27/2021 1439   CALCIUM 9.3 06/27/2021 1439   GFRNONAA >60 03/13/2021 1439   GFRAA 69 03/08/2020 1517    BNP    Component Value Date/Time   BNP 44.8 03/15/2021 0501     Imaging:  DG Chest 2 View  Result Date: 06/27/2021 CLINICAL DATA:  Shortness of breath and cough in a 76 year old female. EXAM: CHEST - 2 VIEW COMPARISON:  June 18, 2021. FINDINGS: Cardiomediastinal contours and hilar structures are stable. Persistent basilar opacity at the RIGHT lung base appears more conspicuous than on priors from earlier in January but similar to the most recent prior. Central area on the frontal view of more pronounced density measuring up to 1.5 cm. No sign of lobar consolidation. No pneumothorax. No gross pleural effusion. On limited assessment there is no acute skeletal process. IMPRESSION: Persistent RIGHT basilar opacity favored to represent scarring given presence on multiple prior studies. Consolidative changes were noted in this area on previous imaging from October of 2022 which may explain these findings. There is a central area  of more nodular appearing density measuring up to  1.5 cm, not allowing for exclusion of a coexistent nodule. Consider 6-8 week follow-up with chest CT to document new baseline and exclude this possibility. Electronically Signed   By: Zetta Bills M.D.   On: 06/27/2021 15:51   DG Chest 2 View  Result Date: 06/18/2021 CLINICAL DATA:  SOB; pleural effusions EXAM: CHEST - 2 VIEW COMPARISON:  05/24/2021. FINDINGS: Similar small bilateral pleural effusions. Similar streaky opacities at the right greater than left lung bases. No visible pneumothorax. No evidence of acute osseous abnormality. Polyarticular degenerative change. Partially imaged cervical ACDF. IMPRESSION: Similar small bilateral pleural effusions and overlying streaky opacities. Favor atelectasis/scarring, although infection is not excluded. Electronically Signed   By: Margaretha Sheffield M.D.   On: 06/18/2021 15:33    ipratropium-albuterol (DUONEB) 0.5-2.5 (3) MG/3ML nebulizer solution 3 mL     Date Action Dose Route User   06/27/2021 1512 Given 3 mL Nebulization Mabe, Beatris Ship, CMA      methylPREDNISolone acetate (DEPO-MEDROL) injection 80 mg     Date Action Dose Route User   06/27/2021 1511 Given 80 mg Intramuscular (Right Upper Outer Quadrant) Mabe, Beatris Ship, CMA       PFT Results Latest Ref Rng & Units 01/15/2019  FVC-Pre L 2.17  FVC-Predicted Pre % 76  FVC-Post L 2.21  FVC-Predicted Post % 78  Pre FEV1/FVC % % 56  Post FEV1/FCV % % 59  FEV1-Pre L 1.22  FEV1-Predicted Pre % 57  FEV1-Post L 1.30  DLCO uncorrected ml/min/mmHg 16.18  DLCO UNC% % 85  DLVA Predicted % 95  TLC L 4.84  TLC % Predicted % 96  RV % Predicted % 118    No results found for: NITRICOXIDE      Assessment & Plan:   COPD (chronic obstructive pulmonary disease) (HCC) Significant improvement in symptoms. Breathing returned to baseline. Will continue triple therapy regimen and PRN duoneb.   Patient Instructions  -Continue Breztri 2 puffs Twice daily. Brush tongue and rinse mouth well after. Use  with spacer.  -Continue Albuterol inhaler 2 puffs or Duoneb 3 mL neb every 6 hours as needed for shortness of breath or wheezing. Notify if symptoms persist despite rescue inhaler/neb use. -Continue flonase nasal spray 2 sprays each nostril daily  -Continue zyrtec 10 mg daily -Continue singulair 10 mg At bedtime -Continue supplemental oxygen at 2-3 lpm for oxygen saturation goal >88-90% -Continue mucinex 600 mg twice daily as needed until symptoms improve -Continue lasix 40 mg daily. If swelling returns, follow up with cardiologist    Notify if worsening breathlessness, cough, mucus production, fatigue, or wheezing occurs.  Maintain up to date vaccinations, including influenza, COVID, and pneumococcal.  Wash your hands often and avoid sick exposures.  Encouraged masking in crowds.  Avoid triggers, when possible.  Exercise, as tolerated. Notify if worsening symptoms upon exertion occur.   Follow up in 6 weeks after CT with Dr. Lamonte Sakai or Alanson Aly. If symptoms do not improve or worsen, please contact office for sooner follow up or seek emergency care.    Allergic rhinitis Well-controlled on current regimen.   Chronic respiratory failure with hypoxia (HCC) Stable with sat 97% on 3 lpm POC during visit. Advised she could likely decrease to 2 lpm continuous flow at home for goal >88-90%. Monitor and notify of any increasing requirements.   Chronic diastolic CHF (congestive heart failure) (HCC) Recent BNP nl. Appears compensated on exam. Resolution of pleural effusions with diuresis and  improvement in SOB. Follow up with cardiology.  Paroxysmal atrial fibrillation (HCC) Regular rhythm and rate upon exam today. Continues on Eliquis and rate control regimen. Follow up with cardiology as scheduled.   GERD without esophagitis Well-controlled on PPI regimen with step up to Protonix. 90 day supply sent today to [facility], per pt's request.  Pulmonary nodule Her CT in June '22 had  showed resolution of previously identified pulmonary nodule. We discussed that her chest x ray showed a more nodular appearing area in the RLL now, measuring 1.5cm which could have been infectious; however, we will obtain CT imaging 6 weeks after abx completion for further evaluation especially given her significant smoking history.     Clayton Bibles, NP 07/11/2021  Pt aware and understands NP's role.

## 2021-07-11 NOTE — Assessment & Plan Note (Signed)
Regular rhythm and rate upon exam today. Continues on Eliquis and rate control regimen. Follow up with cardiology as scheduled.

## 2021-07-11 NOTE — Assessment & Plan Note (Signed)
Recent BNP nl. Appears compensated on exam. Resolution of pleural effusions with diuresis and improvement in SOB. Follow up with cardiology.

## 2021-07-11 NOTE — Assessment & Plan Note (Signed)
Well-controlled on PPI regimen with step up to Protonix. 90 day supply sent today to [facility], per pt's request.

## 2021-07-11 NOTE — Assessment & Plan Note (Signed)
Significant improvement in symptoms. Breathing returned to baseline. Will continue triple therapy regimen and PRN duoneb.   Patient Instructions  -Continue Breztri 2 puffs Twice daily. Brush tongue and rinse mouth well after. Use with spacer.  -Continue Albuterol inhaler 2 puffs or Duoneb 3 mL neb every 6 hours as needed for shortness of breath or wheezing. Notify if symptoms persist despite rescue inhaler/neb use. -Continue flonase nasal spray 2 sprays each nostril daily  -Continue zyrtec 10 mg daily -Continue singulair 10 mg At bedtime -Continue supplemental oxygen at 2-3 lpm for oxygen saturation goal >88-90% -Continue mucinex 600 mg twice daily as needed until symptoms improve -Continue lasix 40 mg daily. If swelling returns, follow up with cardiologist    Notify if worsening breathlessness, cough, mucus production, fatigue, or wheezing occurs.  Maintain up to date vaccinations, including influenza, COVID, and pneumococcal.  Wash your hands often and avoid sick exposures.  Encouraged masking in crowds.  Avoid triggers, when possible.  Exercise, as tolerated. Notify if worsening symptoms upon exertion occur.   Follow up in 6 weeks after CT with Dr. Lamonte Sakai or Alanson Aly. If symptoms do not improve or worsen, please contact office for sooner follow up or seek emergency care.

## 2021-07-11 NOTE — Patient Instructions (Signed)
-  Continue Breztri 2 puffs Twice daily. Brush tongue and rinse mouth well after. Use with spacer.  -Continue Albuterol inhaler 2 puffs or Duoneb 3 mL neb every 6 hours as needed for shortness of breath or wheezing. Notify if symptoms persist despite rescue inhaler/neb use. -Continue flonase nasal spray 2 sprays each nostril daily  -Continue zyrtec 10 mg daily -Continue singulair 10 mg At bedtime -Continue supplemental oxygen at 2-3 lpm for oxygen saturation goal >88-90% -Continue mucinex 600 mg twice daily as needed until symptoms improve -Continue lasix 40 mg daily. If swelling returns, follow up with cardiologist    Notify if worsening breathlessness, cough, mucus production, fatigue, or wheezing occurs.  Maintain up to date vaccinations, including influenza, COVID, and pneumococcal.  Wash your hands often and avoid sick exposures.  Encouraged masking in crowds.  Avoid triggers, when possible.  Exercise, as tolerated. Notify if worsening symptoms upon exertion occur.   Follow up in 6 weeks after CT with Dr. Lamonte Sakai or Alanson Aly. If symptoms do not improve or worsen, please contact office for sooner follow up or seek emergency care.

## 2021-07-11 NOTE — Assessment & Plan Note (Signed)
Stable with sat 97% on 3 lpm POC during visit. Advised she could likely decrease to 2 lpm continuous flow at home for goal >88-90%. Monitor and notify of any increasing requirements.

## 2021-07-11 NOTE — Assessment & Plan Note (Addendum)
Her CT in June '22 had showed resolution of previously identified pulmonary nodule. We discussed that her chest x ray showed a more nodular appearing area in the RLL now, measuring 1.5cm which could have been infectious; however, we will obtain CT imaging 6 weeks after abx completion for further evaluation especially given her significant smoking history.

## 2021-07-18 ENCOUNTER — Other Ambulatory Visit (HOSPITAL_COMMUNITY): Payer: Medicare Other

## 2021-07-18 ENCOUNTER — Other Ambulatory Visit: Payer: Self-pay | Admitting: *Deleted

## 2021-07-18 NOTE — Patient Outreach (Signed)
[facility]) Care Management ? ?07/18/2021 ? ?[name] ?1945/05/28 ?[id number] ? ?Telephone Assessment-Unsuccessful ? ?RN attempted outreach call today however unsuccessful. RN able to leave a HIPAA approved voice message requesting a call back. ? ?Will attempted another outreach call over the next week for ongoing [facility] services. ? ?Raina Mina, RN ?Care Management Coordinator ?[place] ?Main Office [phone]  ?

## 2021-07-20 ENCOUNTER — Ambulatory Visit: Payer: Medicare Other | Admitting: *Deleted

## 2021-07-22 ENCOUNTER — Other Ambulatory Visit: Payer: Self-pay | Admitting: Cardiology

## 2021-07-22 ENCOUNTER — Other Ambulatory Visit: Payer: Self-pay | Admitting: Emergency Medicine

## 2021-07-24 ENCOUNTER — Other Ambulatory Visit: Payer: Self-pay | Admitting: *Deleted

## 2021-07-24 NOTE — Patient Outreach (Signed)
[facility]) Care Management Telephonic RN Care Manager Note   07/24/2021 Name:  [name] Molina MRN:  [id number] DOB:  May 29, 1945  Summary: Verified all upcoming appointments with sufficient transportation source. Pt continues to manager all other medical conditions with no flare ups from her COPD and remains in the GREEN zone. No falls or vertigo episodes encountered. Recommendations/Changes made from today's visit: Will continue to education on prevention measures with COPD during this season of pollen and use of DME for preventing falls/vertigo on safety measures.  Subjective: [name] Molina is an 76 y.o. year old female who is a primary patient of Harlan Stains, MD. The care management team was consulted for assistance with care management and/or care coordination needs.    Telephonic RN Care Manager completed Telephone Visit today.  Objective:   Medications Reviewed Today     Reviewed by Clayton Bibles, NP (Nurse Practitioner) on 07/11/21 at 1452  Med List Status: <None>   Medication Order Taking? Sig Documenting Provider Last Dose Status Informant  acetaminophen (TYLENOL) 650 MG CR tablet [id number] Yes Take 1,300 mg by mouth every 8 (eight) hours as needed for pain. [provider] Taking Active Family Member  albuterol (VENTOLIN HFA) 108 (90 Base) MCG/ACT inhaler [id number] Yes Inhale 2 puffs into the lungs every 6 (six) hours as needed for wheezing or shortness of breath. Parrett, Fonnie Mu, NP Taking Active Family Member  apixaban (ELIQUIS) 5 MG TABS tablet [id number] Yes Take 1 tablet (5 mg total) by mouth 2 (two) times daily. Constance Haw, MD Taking Active   BESIVANCE 0.6 % SUSP [id number] Yes Place 1 drop into the left eye See admin instructions. Instill 1 drop into the left eye 4 times daily the day OF and day AFTER (eye injection) [provider] Taking Active Family Member  Budeson-Glycopyrrol-Formoterol (BREZTRI AEROSPHERE) 160-9-4.8  MCG/ACT AERO [id number]  Inhale 2 puffs into the lungs in the morning and at bedtime. Clayton Bibles, NP  Active   cetirizine (ZYRTEC) 10 MG tablet [id number] Yes TAKE ONE TABLET BY MOUTH ONCE DAILY Collene Gobble, MD Taking Active Family Member  Cyanocobalamin (VITAMIN B-12 PO) [id number] Yes Take 2,500 mcg by mouth daily. [provider] Taking Active Family Member  diltiazem (CARDIZEM CD) 240 MG 24 hr capsule [id number]  Take 1 capsule (240 mg total) by mouth daily.  Patient taking differently: Take 240 mg by mouth daily. Taking this medication   Constance Haw, MD  Active Family Member  DULoxetine (CYMBALTA) 60 MG capsule [id number] Yes Take 60 mg by mouth every morning. [provider] Taking Active Family Member  flecainide (TAMBOCOR) 50 MG tablet [id number] Yes TAKE ONE TABLET BY MOUTH AT BREAKFAST AND AT BEDTIME Camnitz, Will Hassell Done, MD Taking Active   fluticasone [facility]) 50 MCG/ACT nasal spray [id number] Yes Place 1 spray into both nostrils daily.  Patient taking differently: Place 1 spray into both nostrils daily. Uses only when needed   Collene Gobble, MD Taking Active Family Member  furosemide (LASIX) 40 MG tablet [id number]  Take 1 tablet (40 mg total) by mouth daily. Donne Hazel, MD  Expired 06/18/21 2359 Family Member  furosemide (LASIX) 40 MG tablet [id number]  Take 1 tablet (40 mg total) by mouth daily for 3 days. Clayton Bibles, NP  Expired 06/21/21 2359   ipratropium-albuterol (DUONEB) 0.5-2.5 (3) MG/3ML SOLN [id number] Yes INHALE contents of ONE vial using nebulizer TWICE DAILY Byrum, Rose Fillers, MD Taking Active   LORazepam (  ATIVAN) 0.5 MG tablet [id number] Yes Take 1-2 tablets (0.5-1 mg total) by mouth daily as needed for anxiety. Anxiety Amin, Jeanella Flattery, MD Taking Active   metoprolol succinate (TOPROL-XL) 50 MG 24 hr tablet [id number] Yes TAKE ONE TABLET BY MOUTH EVERYDAY AT BEDTIME Jerline Pain, MD Taking Active Family Member  montelukast  (SINGULAIR) 10 MG tablet [id number] Yes Take 10 mg by mouth at bedtime. [provider] Taking Active Family Member           Med Note Wyatt Portela, Everlean Cherry May 22, 2014  4:20 PM)     Multiple Vitamins-Minerals (PRESERVISION AREDS 2+MULTI VIT) CAPS [id number] Yes Take 1 tablet by mouth 2 (two) times daily. [provider] Taking Active Family Member  pantoprazole (PROTONIX) 40 MG tablet [id number]  Take 1 tablet (40 mg total) by mouth daily. Clayton Bibles, NP  Active   pravastatin (PRAVACHOL) 40 MG tablet [id number] Yes Take 40 mg by mouth at bedtime.  [provider] Taking Active Family Member             SDOH:  (Social Determinants of Health) assessments and interventions performed:     Care Plan  Review of patient past medical history, allergies, medications, health status, including review of consultants reports, laboratory and other test data, was performed as part of comprehensive evaluation for care management services.   Care Plan : RN Care Manager plan of care  Updates made by Tobi Bastos, RN since 07/24/2021 12:00 AM     Problem: Knowledge deficit related to falls/weakness/vertigo and care coordination needs   Priority: High     Long-Range Goal: Development plan of care for management of Falls/Weakness   Start Date: 04/23/2021  Expected End Date: 09/14/2021  This Visit's Progress: On track  Recent Progress: On track  Priority: High  Note:   Current Barriers:  Knowledge Deficits related to plan of care for management of Weakness/Falls prevention/Vertigo   RNCM Clinical Goal(s):  Patient will Knowledge Deficits related to plan of care for management of Weakness/Falls prevention/Vertigo through collaboration with RN Care manager, provider, and care team.   Interventions: Inter-disciplinary care team collaboration (see longitudinal plan of care) Evaluation of current treatment plan related to  self management and patient's adherence to  plan as established by provider   Health Maintenance Interventions:  (Status:  Goal on track:  Yes.) Long Term Goal Knowledge Deficits related to plan of care for management of Weakness/Falls prevention/Vertigo    COPD Interventions:  (Status:  New goal.) Long Term Goal Provided patient with basic written and verbal COPD education on self care/management/and exacerbation prevention Advised patient to track and manage COPD triggers Provided written and verbal instructions on pursed lip breathing and utilized returned demonstration as teach back Provided instruction about proper use of medications used for management of COPD including inhalers Provided education about and advised patient to utilize infection prevention strategies to reduce risk of respiratory infection   New (Jan): Reports some congestion with recent PCP office visit requiring doxycycline and prednisone ( awaiting pharmacy to deliver) however breathing without difficulty. Educated on the COPD action plan and what to do if acute symptoms occur. Will send COPD education information via AVS for reference and teach-back level of knowledge. Accidentally "stumbled" with small injury of soreness but continue to request assistance from her support team when needed. No injuries requiring ED visit at this time.  2/3 Update: Pt reports issues with fluid between her chest wall and lungs-treated with  Lasix-much better with minimal symptoms at this time. Pt will follow up on 2/8. Reports she completed of a recent "tele-o-visit", lab-work, Ex-rays, finished the prescribed antibiotics and currently taking Mucinex which is helping with her ongoing secretions. No other reported issues as pt continue to use her assistive devices for fall prevention and aware when vertigo symptoms and what to do if acute.  07/24/21 Update: Pt reports she continue to due well and denies any falls or vertigo symptoms. Verified all upcoming appointments with sufficient  transportation source. Pt continues to manager all other medical conditions with no flare ups from her COPD and remains in the GREEN zone. Will continue to education on prevention measures with COPD during this season of pollen and use of DME for preventing falls/vertigo on safety measures.  Patient Goals/Self-Care Activities: Take all medications as prescribed Attend all scheduled provider appointments Call pharmacy for medication refills 3-7 days in advance of running out of medications Call provider office for new concerns or questions   Follow Up Plan:  Telephone follow up appointment with care management team member scheduled for:  April 2023 The patient has been provided with contact information for the care management team and has been advised to call with any health related questions or concerns.        Raina Mina, RN Care Management Coordinator [place] Office [phone]

## 2021-07-30 DIAGNOSIS — E785 Hyperlipidemia, unspecified: Secondary | ICD-10-CM | POA: Diagnosis not present

## 2021-07-30 DIAGNOSIS — I10 Essential (primary) hypertension: Secondary | ICD-10-CM | POA: Diagnosis not present

## 2021-07-30 DIAGNOSIS — J441 Chronic obstructive pulmonary disease with (acute) exacerbation: Secondary | ICD-10-CM | POA: Diagnosis not present

## 2021-07-30 DIAGNOSIS — I5033 Acute on chronic diastolic (congestive) heart failure: Secondary | ICD-10-CM | POA: Diagnosis not present

## 2021-07-30 DIAGNOSIS — J4520 Mild intermittent asthma, uncomplicated: Secondary | ICD-10-CM | POA: Diagnosis not present

## 2021-07-30 DIAGNOSIS — F331 Major depressive disorder, recurrent, moderate: Secondary | ICD-10-CM | POA: Diagnosis not present

## 2021-08-15 ENCOUNTER — Other Ambulatory Visit: Payer: Self-pay

## 2021-08-15 ENCOUNTER — Ambulatory Visit (HOSPITAL_COMMUNITY)
Admission: RE | Admit: 2021-08-15 | Discharge: 2021-08-15 | Disposition: A | Discharge status: Home / Self Care | Payer: Medicare Other | Source: Ambulatory Visit | Attending: Nurse Practitioner | Admitting: Nurse Practitioner

## 2021-08-15 DIAGNOSIS — I10 Essential (primary) hypertension: Secondary | ICD-10-CM | POA: Diagnosis not present

## 2021-08-15 DIAGNOSIS — M5136 Other intervertebral disc degeneration, lumbar region: Secondary | ICD-10-CM | POA: Diagnosis not present

## 2021-08-15 DIAGNOSIS — R911 Solitary pulmonary nodule: Secondary | ICD-10-CM | POA: Insufficient documentation

## 2021-08-15 DIAGNOSIS — E785 Hyperlipidemia, unspecified: Secondary | ICD-10-CM | POA: Diagnosis not present

## 2021-08-15 DIAGNOSIS — F329 Major depressive disorder, single episode, unspecified: Secondary | ICD-10-CM | POA: Diagnosis not present

## 2021-08-15 DIAGNOSIS — J449 Chronic obstructive pulmonary disease, unspecified: Secondary | ICD-10-CM | POA: Diagnosis not present

## 2021-08-15 DIAGNOSIS — I5033 Acute on chronic diastolic (congestive) heart failure: Secondary | ICD-10-CM | POA: Diagnosis not present

## 2021-08-15 DIAGNOSIS — J9611 Chronic respiratory failure with hypoxia: Secondary | ICD-10-CM | POA: Diagnosis not present

## 2021-08-15 DIAGNOSIS — D6869 Other thrombophilia: Secondary | ICD-10-CM | POA: Diagnosis not present

## 2021-08-15 DIAGNOSIS — J432 Centrilobular emphysema: Secondary | ICD-10-CM | POA: Diagnosis not present

## 2021-08-15 DIAGNOSIS — I480 Paroxysmal atrial fibrillation: Secondary | ICD-10-CM | POA: Diagnosis not present

## 2021-08-15 DIAGNOSIS — R918 Other nonspecific abnormal finding of lung field: Secondary | ICD-10-CM | POA: Diagnosis not present

## 2021-08-20 ENCOUNTER — Other Ambulatory Visit: Payer: Self-pay | Admitting: Cardiology

## 2021-08-20 DIAGNOSIS — I4891 Unspecified atrial fibrillation: Secondary | ICD-10-CM

## 2021-08-23 ENCOUNTER — Other Ambulatory Visit: Payer: Self-pay | Admitting: *Deleted

## 2021-08-23 NOTE — Patient Outreach (Signed)
?[facility]) Care Management ?Telephonic RN Care Manager Note ? ? ?08/23/2021 ?Name:  [name] Molina MRN:  [id number] DOB:  1946/05/01 ? ?Summary: ?Pt had a recent fall with good recovery and did not need to go to the hospital. Continue to use her RW and remain home O2 at 2-3 lts with 97% sats. COPD continue to be in the GREEN zone. ? ?Recommendations/Changes made from today's visit: ?Pt of case closure with an embedded RNCM with her providers office. ? ?Subjective: ?[name] Molina is an 76 y.o. year old female who is a primary patient of Harlan Stains, MD. The care management team was consulted for assistance with care management and/or care coordination needs.   ? ?Telephonic RN Care Manager completed Telephone Visit today. ? ?Objective:  ? ?Medications Reviewed Today   ? ? Reviewed by Clayton Bibles, NP (Nurse Practitioner) on 07/11/21 at 1452  Med List Status: <None>  ? ?Medication Order Taking? Sig Documenting Provider Last Dose Status Informant  ?acetaminophen (TYLENOL) 650 MG CR tablet [id number] Yes Take 1,300 mg by mouth every 8 (eight) hours as needed for pain. [provider] Taking Active Family Member  ?albuterol (VENTOLIN HFA) 108 (90 Base) MCG/ACT inhaler [id number] Yes Inhale 2 puffs into the lungs every 6 (six) hours as needed for wheezing or shortness of breath. Parrett, Fonnie Mu, NP Taking Active Family Member  ?apixaban (ELIQUIS) 5 MG TABS tablet [id number] Yes Take 1 tablet (5 mg total) by mouth 2 (two) times daily. Constance Haw, MD Taking Active   ?BESIVANCE 0.6 % SUSP [id number] Yes Place 1 drop into the left eye See admin instructions. Instill 1 drop into the left eye 4 times daily the day OF and day AFTER (eye injection) [provider] Taking Active Family Member  ?Budeson-Glycopyrrol-Formoterol (BREZTRI AEROSPHERE) 160-9-4.8 MCG/ACT AERO [id number]  Inhale 2 puffs into the lungs in the morning and at bedtime. Clayton Bibles, NP  Active   ?cetirizine  (ZYRTEC) 10 MG tablet [id number] Yes TAKE ONE TABLET BY MOUTH ONCE DAILY Byrum, Rose Fillers, MD Taking Active Family Member  ?Cyanocobalamin (VITAMIN B-12 PO) [id number] Yes Take 2,500 mcg by mouth daily. [provider] Taking Active Family Member  ?diltiazem (CARDIZEM CD) 240 MG 24 hr capsule [id number]  Take 1 capsule (240 mg total) by mouth daily.  ?Patient taking differently: Take 240 mg by mouth daily. Taking this medication  ? Constance Haw, MD  Active Family Member  ?DULoxetine (CYMBALTA) 60 MG capsule [id number] Yes Take 60 mg by mouth every morning. [provider] Taking Active Family Member  ?flecainide (TAMBOCOR) 50 MG tablet [id number] Yes TAKE ONE TABLET BY MOUTH AT BREAKFAST AND AT BEDTIME Camnitz, Will Hassell Done, MD Taking Active   ?fluticasone (FLONASE) 50 MCG/ACT nasal spray [id number] Yes Place 1 spray into both nostrils daily.  ?Patient taking differently: Place 1 spray into both nostrils daily. Uses only when needed  ? Collene Gobble, MD Taking Active Family Member  ?furosemide (LASIX) 40 MG tablet [id number]  Take 1 tablet (40 mg total) by mouth daily. Donne Hazel, MD  Expired 06/18/21 2359 Family Member  ?furosemide (LASIX) 40 MG tablet [id number]  Take 1 tablet (40 mg total) by mouth daily for 3 days. Clayton Bibles, NP  Expired 06/21/21 2359   ?ipratropium-albuterol (DUONEB) 0.5-2.5 (3) MG/3ML SOLN [id number] Yes INHALE contents of ONE vial using nebulizer TWICE DAILY Byrum, Rose Fillers, MD Taking Active   ?LORazepam (ATIVAN) 0.5 MG tablet [id number] Yes  Take 1-2 tablets (0.5-1 mg total) by mouth daily as needed for anxiety. Anxiety Amin, Jeanella Flattery, MD Taking Active   ?metoprolol succinate (TOPROL-XL) 50 MG 24 hr tablet [id number] Yes TAKE ONE TABLET BY MOUTH EVERYDAY AT BEDTIME Jerline Pain, MD Taking Active Family Member  ?montelukast (SINGULAIR) 10 MG tablet [id number] Yes Take 10 mg by mouth at bedtime. [provider] Taking Active Family Member  ?         ?Med  Note Aloha Gell May 22, 2014  4:20 PM)   ?  ?Multiple Vitamins-Minerals (PRESERVISION AREDS 2+MULTI VIT) CAPS [id number] Yes Take 1 tablet by mouth 2 (two) times daily. [provider] Taking Active Family Member  ?pantoprazole (PROTONIX) 40 MG tablet [id number]  Take 1 tablet (40 mg total) by mouth daily. Clayton Bibles, NP  Active   ?pravastatin (PRAVACHOL) 40 MG tablet [id number] Yes Take 40 mg by mouth at bedtime.  [provider] Taking Active Family Member  ? ?  ?  ? ?  ? ? ? ?SDOH:  (Social Determinants of Health) assessments and interventions performed:  ? ? ? ?Care Plan ? ?Review of patient past medical history, allergies, medications, health status, including review of consultants reports, laboratory and other test data, was performed as part of comprehensive evaluation for care management services.  ? ?Care Plan : RN Care Manager plan of care  ?Updates made by Tobi Bastos, RN since 08/23/2021 12:00 AM  ?  ? ?Problem: Knowledge deficit related to falls/weakness/vertigo and care coordination needs   ?Priority: High  ?  ? ?Long-Range Goal: Development plan of care for management of Falls/Weakness Completed 08/23/2021  ?Start Date: 04/23/2021  ?Expected End Date: 02/15/2022  ?This Visit's Progress: On track  ?Recent Progress: On track  ?Priority: High  ?Note:   ?Current Barriers:  ?Knowledge Deficits related to plan of care for management of Weakness/Falls prevention/Vertigo  ? ?RNCM Clinical Goal(s):  ?Patient will Knowledge Deficits related to plan of care for management of Weakness/Falls prevention/Vertigo through collaboration with RN Care manager, provider, and care team.  ? ?Interventions: ?Inter-disciplinary care team collaboration (see longitudinal plan of care) ?Evaluation of current treatment plan related to  self management and patient's adherence to plan as established by provider ? ? ?Health Maintenance Interventions: ? (Status:  Goal on track:  Yes.) Long Term  Goal ?Knowledge Deficits related to plan of care for management of Weakness/Falls prevention/Vertigo  ? ? ?COPD Interventions:  (Status:  New goal.) Long Term Goal ?Provided patient with basic written and verbal COPD education on self care/management/and exacerbation prevention ?Advised patient to track and manage COPD triggers ?Provided written and verbal instructions on pursed lip breathing and utilized returned demonstration as teach back ?Provided instruction about proper use of medications used for management of COPD including inhalers ?Provided education about and advised patient to utilize infection prevention strategies to reduce risk of respiratory infection  ? ?New (Jan): Reports some congestion with recent PCP office visit requiring doxycycline and prednisone ( awaiting pharmacy to deliver) however breathing without difficulty. Educated on the COPD action plan and what to do if acute symptoms occur. Will send COPD education information via AVS for reference and teach-back level of knowledge. Accidentally "stumbled" with small injury of soreness but continue to request assistance from her support team when needed. No injuries requiring ED visit at this time. ? ?2/3 Update: Pt reports issues with fluid between her chest wall and lungs-treated with Lasix-much better with minimal symptoms  at this time. Pt will follow up on 2/8. Reports she completed of a recent "tele-o-visit", lab-work, Ex-rays, finished the prescribed antibiotics and currently taking Mucinex which is helping with her ongoing secretions. No other reported issues as pt continue to use her assistive devices for fall prevention and aware when vertigo symptoms and what to do if acute. ? ?07/24/21 Update: Pt reports she continue to due well and denies any falls or vertigo symptoms. Verified all upcoming appointments with sufficient transportation source. Pt continues to manager all other medical conditions with no flare ups from her COPD and remains  in the GREEN zone. Will continue to education on prevention measures with COPD during this season of pollen and use of DME for preventing falls/vertigo on safety measures. ? ?08/23/21 Update: Pt reports she re

## 2021-08-24 ENCOUNTER — Encounter: Payer: Self-pay | Admitting: Emergency Medicine

## 2021-08-24 ENCOUNTER — Ambulatory Visit (INDEPENDENT_AMBULATORY_CARE_PROVIDER_SITE_OTHER): Payer: Medicare Other | Admitting: Emergency Medicine

## 2021-08-24 DIAGNOSIS — J9611 Chronic respiratory failure with hypoxia: Secondary | ICD-10-CM | POA: Diagnosis not present

## 2021-08-24 DIAGNOSIS — J438 Other emphysema: Secondary | ICD-10-CM

## 2021-08-24 DIAGNOSIS — R911 Solitary pulmonary nodule: Secondary | ICD-10-CM | POA: Diagnosis not present

## 2021-08-24 DIAGNOSIS — J301 Allergic rhinitis due to pollen: Secondary | ICD-10-CM

## 2021-08-24 DIAGNOSIS — I5032 Chronic diastolic (congestive) heart failure: Secondary | ICD-10-CM

## 2021-08-24 NOTE — Assessment & Plan Note (Signed)
Continue your Singulair, Zyrtec and Flonase as you have been taking them. ?We reviewed your CT scan of the chest today.  The areas that were seen on your previous chest x-ray have resolved.  Good news.  No concerning nodule seen. ?

## 2021-08-24 NOTE — Assessment & Plan Note (Signed)
Please continue your oxygen at 2 L/min as you have been using it ?

## 2021-08-24 NOTE — Patient Instructions (Addendum)
Please continue Breztri 2 puffs twice a day.  Rinse and gargle after using. ?Use your albuterol 2 puffs if needed for shortness of breath, chest tightness, wheezing. ?You can continue to use your DuoNeb up to every 6 hours when needed for shortness of breath ?Continue your Singulair, Zyrtec and Flonase as you have been taking them. ?We reviewed your CT scan of the chest today.  The areas that were seen on your previous chest x-ray have resolved.  Good news.  No concerning nodule seen. ?Please continue your oxygen at 2 L/min as you have been using it ?Follow with Dr. Lamonte Sakai in 6 months or sooner if you have any problems ?

## 2021-08-24 NOTE — Assessment & Plan Note (Signed)
Prior pulmonary nodules have resolved.  She had a hazy nodular infiltrate noted on the chest x-ray from 06/2021.  Prompted CT chest that we reviewed today.  The infiltrates are improved, no concerning nodule seen.  I reassured her about these results. ?

## 2021-08-24 NOTE — Assessment & Plan Note (Signed)
Cardiac regimen and diuretics as ordered ?

## 2021-08-24 NOTE — Assessment & Plan Note (Signed)
Please continue Breztri 2 puffs twice a day.  Rinse and gargle after using. ?Use your albuterol 2 puffs if needed for shortness of breath, chest tightness, wheezing. ?You can continue to use your DuoNeb up to every 6 hours when needed for shortness of breath ?Follow with Dr. Lamonte Sakai in 6 months or sooner if you have any problems ?

## 2021-08-24 NOTE — Progress Notes (Signed)
?Subjective:  ? ? Patient ID: [name] Molina, female    DOB: Mar 27, 1946, 76 y.o.   MRN: [id number] ?HPI ? ?ROV 05/24/21 --[name] Molina is 76 and has a history of COPD/fixed asthma as well as chronic cough in the setting of allergic rhinitis and GERD.  She had COVID-19 in October and was hospitalized after a fall. Treated for PNA and AE-COPD.  She also received some extra lasix for associated LE edema. She has gained some strength with rehab. She is having increased cough for last few days, had a fever last 2 days. Also some increased exertional SOB.  ?She has been managed on Trelegy, uses albuterol or DuoNeb, increased use over the last 2 days.  ?Remains on Singulair, fluticasone nasal spray ? ? ?ROV 08/24/21 --[name] Molina is 76 and follows up today for her history of COPD/fixed asthma.  She has chronic recurrent cough in the setting of allergic rhinitis, GERD.  Also with diastolic CHF from hypertension, atrial fibrillation.  She has chronic hypoxemic respiratory failure and is on 2 L/min via POC.  She was treated for flare of her COPD/asthma in February.  Chest x-ray showed acute on chronic right lower lobe infiltrate prompting repeat CT chest as below.  ?Currently on Breztri, duoneb a few times a week. Albuterol most days.  ?Zyrtec, Flonase as needed, Singulair, Protonix ? ?CT chest 08/15/2021 reviewed by me shows no significant hilar nodes, some mildly prominent mediastinal nodes that are unchanged, mild centrilobular emphysema with interval improvement in aeration of both lung bases, mild subsegmental atelectatic change, no concerning nodules or infiltrates. ? ? ?   ?Objective:  ? Physical Exam ?Vitals:  ? 08/24/21 1543  ?BP: 108/62  ?Pulse: 85  ?Temp: 98.2 ?F (36.8 ?C)  ?TempSrc: Oral  ?SpO2: 96%  ?Weight: 218 lb (98.9 kg)  ?Height: '5\' 2"'$  (1.575 m)  ? ?Gen: Pleasant, overweight woman, in no distress,  normal affect, wheelchair ? ?ENT: No lesions,  mouth clear,  oropharynx clear, no postnasal drip ? ?Neck: No JVD, no  stridor ? ?Lungs: No use of accessory muscles, distant, few scattered rhonchi, no wheeze ? ?Cardiovascular: RRR, heart sounds normal, no murmur or gallops ? ?Musculoskeletal: no deformities ? ?Neuro: alert, non focal ? ?Skin: Warm, no lesions or rashes ? ?   ?Assessment & Plan:  ?COPD (chronic obstructive pulmonary disease) ([place]) ?Please continue Breztri 2 puffs twice a day.  Rinse and gargle after using. ?Use your albuterol 2 puffs if needed for shortness of breath, chest tightness, wheezing. ?You can continue to use your DuoNeb up to every 6 hours when needed for shortness of breath ?Follow with Dr. Lamonte Sakai in 6 months or sooner if you have any problems ? ?Pulmonary nodule ?Prior pulmonary nodules have resolved.  She had a hazy nodular infiltrate noted on the chest x-ray from 06/2021.  Prompted CT chest that we reviewed today.  The infiltrates are improved, no concerning nodule seen.  I reassured her about these results. ? ?Chronic diastolic CHF (congestive heart failure) ([place]) ?Cardiac regimen and diuretics as ordered ? ?Allergic rhinitis ?Continue your Singulair, Zyrtec and Flonase as you have been taking them. ?We reviewed your CT scan of the chest today.  The areas that were seen on your previous chest x-ray have resolved.  Good news.  No concerning nodule seen. ? ?Chronic respiratory failure with hypoxia ([place]) ?Please continue your oxygen at 2 L/min as you have been using it ? ?Baltazar Apo, MD, PhD ?08/24/2021, 4:09 PM ? Pulmonary and Critical  Care ?[phone] or if no answer [phone] ? ?

## 2021-09-06 DIAGNOSIS — J441 Chronic obstructive pulmonary disease with (acute) exacerbation: Secondary | ICD-10-CM | POA: Diagnosis not present

## 2021-09-06 DIAGNOSIS — K219 Gastro-esophageal reflux disease without esophagitis: Secondary | ICD-10-CM | POA: Diagnosis not present

## 2021-09-06 DIAGNOSIS — I5033 Acute on chronic diastolic (congestive) heart failure: Secondary | ICD-10-CM | POA: Diagnosis not present

## 2021-09-06 DIAGNOSIS — I480 Paroxysmal atrial fibrillation: Secondary | ICD-10-CM | POA: Diagnosis not present

## 2021-09-06 DIAGNOSIS — I10 Essential (primary) hypertension: Secondary | ICD-10-CM | POA: Diagnosis not present

## 2021-09-06 DIAGNOSIS — E785 Hyperlipidemia, unspecified: Secondary | ICD-10-CM | POA: Diagnosis not present

## 2021-09-26 DIAGNOSIS — K219 Gastro-esophageal reflux disease without esophagitis: Secondary | ICD-10-CM | POA: Diagnosis not present

## 2021-09-26 DIAGNOSIS — E785 Hyperlipidemia, unspecified: Secondary | ICD-10-CM | POA: Diagnosis not present

## 2021-09-26 DIAGNOSIS — I5033 Acute on chronic diastolic (congestive) heart failure: Secondary | ICD-10-CM | POA: Diagnosis not present

## 2021-09-26 DIAGNOSIS — I10 Essential (primary) hypertension: Secondary | ICD-10-CM | POA: Diagnosis not present

## 2021-09-26 DIAGNOSIS — J441 Chronic obstructive pulmonary disease with (acute) exacerbation: Secondary | ICD-10-CM | POA: Diagnosis not present

## 2021-10-09 ENCOUNTER — Encounter (INDEPENDENT_AMBULATORY_CARE_PROVIDER_SITE_OTHER): Payer: Medicare Other | Admitting: Ophthalmology

## 2021-10-09 DIAGNOSIS — H43813 Vitreous degeneration, bilateral: Secondary | ICD-10-CM

## 2021-10-09 DIAGNOSIS — H35033 Hypertensive retinopathy, bilateral: Secondary | ICD-10-CM

## 2021-10-09 DIAGNOSIS — H353112 Nonexudative age-related macular degeneration, right eye, intermediate dry stage: Secondary | ICD-10-CM | POA: Diagnosis not present

## 2021-10-09 DIAGNOSIS — H353221 Exudative age-related macular degeneration, left eye, with active choroidal neovascularization: Secondary | ICD-10-CM

## 2021-10-09 DIAGNOSIS — I10 Essential (primary) hypertension: Secondary | ICD-10-CM

## 2021-10-11 ENCOUNTER — Ambulatory Visit (INDEPENDENT_AMBULATORY_CARE_PROVIDER_SITE_OTHER): Payer: Medicare Other

## 2021-10-11 DIAGNOSIS — J9611 Chronic respiratory failure with hypoxia: Secondary | ICD-10-CM

## 2021-10-18 ENCOUNTER — Ambulatory Visit (INDEPENDENT_AMBULATORY_CARE_PROVIDER_SITE_OTHER): Payer: Medicare Other | Admitting: Nurse Practitioner

## 2021-10-18 ENCOUNTER — Encounter: Payer: Self-pay | Admitting: Nurse Practitioner

## 2021-10-18 VITALS — BP 98/60 | HR 58 | Ht 62.5 in | Wt 220.0 lb

## 2021-10-18 DIAGNOSIS — I517 Cardiomegaly: Secondary | ICD-10-CM

## 2021-10-18 DIAGNOSIS — E785 Hyperlipidemia, unspecified: Secondary | ICD-10-CM

## 2021-10-18 DIAGNOSIS — Z7901 Long term (current) use of anticoagulants: Secondary | ICD-10-CM | POA: Diagnosis not present

## 2021-10-18 DIAGNOSIS — I700 Atherosclerosis of aorta: Secondary | ICD-10-CM | POA: Diagnosis not present

## 2021-10-18 DIAGNOSIS — L819 Disorder of pigmentation, unspecified: Secondary | ICD-10-CM | POA: Diagnosis not present

## 2021-10-18 DIAGNOSIS — Z79899 Other long term (current) drug therapy: Secondary | ICD-10-CM | POA: Diagnosis not present

## 2021-10-18 DIAGNOSIS — I050 Rheumatic mitral stenosis: Secondary | ICD-10-CM | POA: Diagnosis not present

## 2021-10-18 DIAGNOSIS — I10 Essential (primary) hypertension: Secondary | ICD-10-CM

## 2021-10-18 DIAGNOSIS — I480 Paroxysmal atrial fibrillation: Secondary | ICD-10-CM | POA: Diagnosis not present

## 2021-10-18 NOTE — Progress Notes (Signed)
Cardiology Office Note:    Date:  10/18/2021   ID:  [name], Molina Jan 18, 1946, MRN [id number]  PCP:  Harlan Stains, MD   [facility] Providers Cardiologist:  Candee Furbish, MD Electrophysiologist:  Will Meredith Leeds, MD     Referring MD: Harlan Stains, MD   Chief Complaint: follow-up PAF   History of Present Illness:    [name] Molina is a very pleasant 76 y.o. female with a hx of hypertension, PAF, PAT, chronic HFpEF, obesity, aortic atherosclerosis, depression, GERD, COPD, and mitral stenosis.  She established care in approximately 2011 for evaluation of palpitations, paroxysmal atrial tachycardia.  She had a nuclear stress test at that time which revealed low risk, no ischemia, normal ejection fraction on echocardiogram.  She had been on diltiazem at that point for several years and palpitations were overall well-controlled.   Admission 11/16/2020 with A Fib with RVR.  DCCV deferred as patient had missed Eliquis and no TEE available. She was started on flecainide. Last seen by EP MD, Dr. Curt Bears on 03/01/21 at which time she was maintaining NSR.  She was advised to follow-up with EP in 6 months.  She was last seen in our office on 04/30/2021 by Dr. Marlou Porch at which time no changes were made to her medical regimen and 58-monthfollow-up was recommended.  Today, she is here with her cousin, [name] Molina. She reports she is feeling well. Had on "flutter attack" 2 weeks ago that was short-lived and resolved on its own. On one occasion, noted BP was 88/48 and she felt lightheaded but after a while she felt better and BP improved. Chronic discoloration, violaceous in color, to bilateral lower extremities with no reported symptoms of claudication. She does not ambulate much.  She is on chronic oxygen.  She denies chest pain, shortness of breath, presyncope, syncope.  Past Medical History:  Diagnosis Date   Anxiety    Aortic atherosclerosis (HCC)    Arthritis    Asthma    [place]  [place]    Cancer (HCC)    basal skin cancer on nose   Cataract    Chronic diastolic CHF (congestive heart failure) (HCC)    Complication of anesthesia    during cervical surgery she woke up during the procedure   COPD [place] disease[place]    Depression    Diverticulosis    Family history of adverse reaction to anesthesia    mother had post op N&V   GERD (gastroesophageal reflux disease)    History of hiatal hernia    Hyperlipidemia    Hypertension    Insomnia    Macular degeneration    Migraines    Neuropathy    Obesity    PAF (paroxysmal atrial fibrillation) ([place]    Pneumonia    Sleep apnea    history of it,not using a cpap, study 2018: didn't need cpap   Thyroid nodule 2000   radioactive caspule    Past Surgical History:  Procedure Laterality Date   ANTERIOR CERVICAL DECOMP/DISCECTOMY FUSION N/A 06/15/2020   Procedure: ANTERIOR CERVICAL DECOMPRESSION FUSION CERVICAL FIVE-CERVICAL SIX WITH INSTRUMENTATION AND ALLOGRAFT;  Surgeon: DPhylliss Bob MD;  [place]  Service: [place]opedics;  Laterality: N/A;  anterior   CERVICAL POLYPECTOMY     INSIDE AND OUTSIDE   COLONOSCOPY     EYE SURGERY Bilateral    cataracts   POLYPECTOMY     REMOVED CARTLIAGE RIB      Current Medications: Current Meds  Medication Sig   acetaminophen (TYLENOL) 650 MG CR tablet Take 1,300 mg by mouth every 8 (eight) hours as needed for pain.   albuterol (VENTOLIN HFA) 108 (90 Base) MCG/ACT inhaler Inhale 2 puffs into the lungs every 6 (six) hours as needed for wheezing or shortness of breath.   apixaban (ELIQUIS) 5 MG TABS tablet Take 1 tablet (5 mg total) by mouth 2 (two) times daily.   BESIVANCE 0.6 % SUSP Place 1 drop into the left eye See admin instructions. Instill 1 drop into the left eye 4 times daily the day OF and day AFTER (eye injection)   Budeson-Glycopyrrol-Formoterol (BREZTRI AEROSPHERE) 160-9-4.8 MCG/ACT AERO Inhale 2 puffs into the lungs in the morning and  at bedtime.   cetirizine (ZYRTEC) 10 MG tablet TAKE ONE TABLET BY MOUTH ONCE DAILY   Cyanocobalamin (VITAMIN B-12 PO) Take 2,500 mcg by mouth daily.   diltiazem (CARDIZEM CD) 240 MG 24 hr capsule TAKE ONE CAPSULE BY MOUTH EVERY MORNING   DULoxetine (CYMBALTA) 60 MG capsule Take 60 mg by mouth every morning.   flecainide (TAMBOCOR) 50 MG tablet TAKE ONE TABLET BY MOUTH AT BREAKFAST AND AT BEDTIME   fluticasone (FLONASE) 50 MCG/ACT nasal spray Place 1 spray into both nostrils daily. (Patient taking differently: Place 1 spray into both nostrils daily. Uses only when needed)   ipratropium-albuterol (DUONEB) 0.5-2.5 (3) MG/3ML SOLN INHALE contents of ONE vial using nebulizer TWICE DAILY (Patient taking differently: As needed)   LORazepam (ATIVAN) 0.5 MG tablet Take 1-2 tablets (0.5-1 mg total) by mouth daily as needed for anxiety. Anxiety   metoprolol succinate (TOPROL-XL) 50 MG 24 hr tablet TAKE ONE TABLET BY MOUTH EVERYDAY AT BEDTIME   montelukast (SINGULAIR) 10 MG tablet Take 10 mg by mouth at bedtime.   Multiple Vitamins-Minerals (PRESERVISION AREDS 2+MULTI VIT) CAPS Take 1 tablet by mouth 2 (two) times daily.   pantoprazole (PROTONIX) 40 MG tablet Take 1 tablet (40 mg total) by mouth daily.   pravastatin (PRAVACHOL) 40 MG tablet Take 40 mg by mouth at bedtime.      Allergies:   Food, Levaquin [levofloxacin in d5w], Tequin, Erythromycin, Oxycodone-acetaminophen, Codeine, Penicillins, Prednisone, and Wellbutrin [bupropion hcl]   Social History   Socioeconomic History   Marital status: Divorced    Spouse name: Not on file   Number of children: 3   Years of education: College   Highest education level: Not on file  Occupational History   Occupation: Emergency planning/management officer  Tobacco Use   Smoking status: Former    Packs/day: 1.00    Years: 20.00    Pack years: 20.00    Types: Cigarettes    Quit date: 05/21/1991    Years since quitting: 30.4   Smokeless tobacco: Never  Vaping Use   Vaping  Use: Never used  Substance and Sexual Activity   Alcohol use: No   Drug use: No   Sexual activity: Not on file  Other Topics Concern   Not on file  Social History Narrative   Patient is divorced with 3 children.   Patient is right handed.   Patient has a college education.   Patient drinks 1-2 servings daily.   Social Determinants of Health   Financial Resource Strain: Not on file  Food Insecurity: No Food Insecurity   Worried About Charity fundraiser in the Last Year: Never true   Ran Out of Food in the Last Year: Never true  Transportation Needs: No Transportation Needs   Lack of  Transportation (Medical): No   Lack of Transportation (Non-Medical): No  Physical Activity: Not on file  Stress: Not on file  Social Connections: Not on file     Family History: The patient's family history includes Asthma in her father; Breast cancer (age of onset: 43) in her maternal grandmother; Colon polyps (age of onset: 60) in her sister; Emphysema in her father; Heart disease in her father and paternal grandfather. There is no history of Colon cancer.  ROS:   Please see the history of present illness.   All other systems reviewed and are negative.  Labs/Other Studies Reviewed:    The following studies were reviewed today:  VAS [place] Lower Ext 11/30/20  Right: Resting right ankle-brachial index indicates noncompressible right  lower extremity arteries. The right toe-brachial index is normal.   Left: Resting left ankle-brachial index indicates noncompressible left  lower extremity arteries. The left toe-brachial index is normal    Echo 11/16/20   1. Afib with RVR noted during the study.   2. Left ventricular ejection fraction, by estimation, is 65 to 70%. The  left ventricle has normal function. The left ventricle has no regional  wall motion abnormalities. There is moderate concentric left ventricular  hypertrophy. Left ventricular  diastolic function could not be evaluated.   3.  Prominent RV epicardial fat. Right ventricular systolic function is  normal. The right ventricular size is normal. Tricuspid regurgitation  signal is inadequate for assessing PA pressure.   4. Left atrial size was mild to moderately dilated.   5. The mitral valve is degenerative. Trivial mitral valve regurgitation.  Mild mitral stenosis. The mean mitral valve gradient is 5.3 mmHg with  average heart rate of 106 bpm. Severe mitral annular calcification.   6. The aortic valve is grossly normal. Aortic valve regurgitation is not  visualized. No aortic stenosis is present.   7. The inferior vena cava is dilated in size with >50% respiratory  variability, suggesting right atrial pressure of 8 mmHg.   Comparison(s): No significant change from prior study.   Cardiac monitor 11/28/19  Predominant rhythm was sinus rhythm with average heart rate of 77 bpm Brief episodes of supraventricular tachycardia which appear to be paroxysmal atrial tachycardia not atrial fibrillation-longest episode was 10 seconds duration No pauses no ventricular tachycardia no excessive ventricular ectopy   Overall reassuring monitor with occasional brief episodes of paroxysmal atrial tachycardia.  No evidence of atrial fibrillation during monitoring as was diagnosed during prior hospitalization.  Even though we did not discover atrial fibrillation during this monitoring period, continue with anticoagulation, Eliquis.  I personally reviewed telemetry records from hospital and atrial fibrillation clearly was present during that time.   Recent Labs: 11/01/2020: TSH 1.150 03/08/2021: ALT 13 03/09/2021: Hemoglobin 11.4; Platelets 135 03/13/2021: Magnesium 2.2 03/15/2021: B Natriuretic Peptide 44.8 06/18/2021: Pro B Natriuretic peptide (BNP) 92.0 06/27/2021: BUN 22; Creatinine, Ser 1.08; Potassium 3.8; Sodium 144  Recent Lipid Panel No results found for: CHOL, TRIG, HDL, CHOLHDL, VLDL, LDLCALC, LDLDIRECT   Risk  Assessment/Calculations:       Physical Exam:    VS:  BP 98/60   Pulse (!) 58   Ht 5' 2.5" (1.588 m)   Wt 220 lb (99.8 kg)   SpO2 100%   BMI 39.60 kg/m     Wt Readings from Last 3 Encounters:  10/18/21 220 lb (99.8 kg)  08/24/21 218 lb (98.9 kg)  07/11/21 211 lb 12.8 oz (96.1 kg)     GEN:  Chronically ill  appearing female in no acute distress HEENT: Normal NECK: No JVD; No carotid bruits CARDIAC: RRR. Soft murmur on ausculation difficult to hear due to distant heart sounds. No rubs, gallops RESPIRATORY:  Clear to auscultation without rales, wheezing or rhonchi  ABDOMEN: Soft, non-tender, non-distended MUSCULOSKELETAL: 2+ pitting edema bilateral LE; Violaceous appearing LE. 2+ pedal pulses, equal bilaterally SKIN: Warm and dry NEUROLOGIC:  Alert and oriented x 3 PSYCHIATRIC:  Normal affect   EKG:  EKG is ordered today.  The ekg ordered today demonstrates NSR at 67 bpm with occasional PACs, no acute change from previous  Diagnoses:    1. Paroxysmal atrial fibrillation (HCC)   2. Chronic anticoagulation   3. Essential hypertension   4. Discoloration of skin of lower leg   5. Atherosclerosis of aorta ([place])   6. High risk medication use   7. Hyperlipidemia LDL goal <70   8. Mild mitral stenosis   9. Left ventricular hypertrophy    Assessment and Plan:     Atrial fibrillation on chronic anticoagulation: HR is stable. Rare palpitations.  She denies bleeding problems on Eliquis.  Continue flecainide, metoprolol, diltiazem, Eliquis.   High risk medication: EKG reveals stable QTc. Continue flecainide.   Mitral valve stenosis: Mild by echo on 11/16/2020.  She is asymptomatic. We will continue to monitor for clinical indications of worsening valve function.   LVH/Hypertension: Moderate LVH on echo 11/16/20. Normal LVEF. BP is well controlled today.  She reports similar readings at home with an occasional low reading.  No reported symptoms consistent with orthostatic  hypotension.   Aortic atherosclerosis/Hyperlipidemia LDL goal < 70: LDL 69 on 04/16/21.  Continue pravastatin.  Leg edema: Skin has been discolored for some time.  She has good pedal pulses and denies significant symptoms concerning for worsening blood flow. She is very sedentary. Encouraged good leg elevation and movement throughout the day.   Disposition:  See EP PA asap, Dr. Marlou Porch in 6 months   Medication Adjustments/Labs and Tests Ordered: Current medicines are reviewed at length with the patient today.  Concerns regarding medicines are outlined above.  Orders Placed This Encounter  Procedures   EKG 12-Lead   No orders of the defined types were placed in this encounter.   Patient Instructions  Medication Instructions:  Your physician recommends that you continue on your current medications as directed. Please refer to the Current Medication list given to you today.  *If you need a refill on your cardiac medications before your next appointment, please call your pharmacy*   Lab Work: None ordered  If you have labs (blood work) drawn today and your tests are completely normal, you will receive your results only by: [place] (if you have MyChart) OR A paper copy in the mail If you have any lab test that is abnormal or we need to change your treatment, we will call you to review the results.   Testing/Procedures: None ordered   Follow-Up: At [facility], you and your health needs are our priority.  As part of our continuing mission to provide you with exceptional heart care, we have created designated Provider Care Teams.  These Care Teams include your primary Cardiologist (physician) and Advanced Practice Providers (APPs -  Physician Assistants and Nurse Practitioners) who all work together to provide you with the care you need, when you need it.  We recommend signing up for the patient portal called "MyChart".  Sign up information is provided on this After Visit  Summary.  MyChart is  used to connect with patients for Virtual Visits (Telemedicine).  Patients are able to view lab/test results, encounter notes, upcoming appointments, etc.  Non-urgent messages can be sent to your provider as well.   To learn more about what you can do with MyChart, go to [email or web address].    Your next appointment:      The format for your next appointment:     Provider:     If primary card or EP is not listed click here to update    :1}    Other Instructions   Important Information About Sugar         Signed, Emmaline Life, NP  10/18/2021 5:15 PM    [place]

## 2021-10-18 NOTE — Patient Instructions (Signed)
Medication Instructions:  Your physician recommends that you continue on your current medications as directed. Please refer to the Current Medication list given to you today.  *If you need a refill on your cardiac medications before your next appointment, please call your pharmacy*   Lab Work: None ordered  If you have labs (blood work) drawn today and your tests are completely normal, you will receive your results only by: [place] (if you have MyChart) OR A paper copy in the mail If you have any lab test that is abnormal or we need to change your treatment, we will call you to review the results.   Testing/Procedures: None ordered   Follow-Up: At [facility], you and your health needs are our priority.  As part of our continuing mission to provide you with exceptional heart care, we have created designated Provider Care Teams.  These Care Teams include your primary Cardiologist (physician) and Advanced Practice Providers (APPs -  Physician Assistants and Nurse Practitioners) who all work together to provide you with the care you need, when you need it.  We recommend signing up for the patient portal called "MyChart".  Sign up information is provided on this After Visit Summary.  MyChart is used to connect with patients for Virtual Visits (Telemedicine).  Patients are able to view lab/test results, encounter notes, upcoming appointments, etc.  Non-urgent messages can be sent to your provider as well.   To learn more about what you can do with MyChart, go to [email or web address].    Your next appointment:      The format for your next appointment:     Provider:     If primary card or EP is not listed click here to update    :1}    Other Instructions   Important Information About Sugar

## 2021-10-25 NOTE — Progress Notes (Signed)
PCP:  Harlan Stains, MD Primary Cardiologist: Candee Furbish, MD Electrophysiologist: Will Meredith Leeds, MD   [name] Molina is a 76 y.o. female seen today for Will Meredith Leeds, MD for routine electrophysiology followup.  Since last being seen in our clinic the patient reports doing well overall. She had an episode of palpitations several weeks ago that was short lived. Otherwise, she feels like she is very well controlled on flecainide.  she denies chest pain,  dyspnea, PND, orthopnea, nausea, vomiting, dizziness, syncope, edema, weight gain, or early satiety.  Past Medical History:  Diagnosis Date   Anxiety    Aortic atherosclerosis (HCC)    Arthritis    Asthma    Atrial tachycardia ([place])    Cancer (HCC)    basal skin cancer on nose   Cataract    Chronic diastolic CHF (congestive heart failure) (HCC)    Complication of anesthesia    during cervical surgery she woke up during the procedure   COPD (chronic obstructive pulmonary disease) ([place])    Depression    Diverticulosis    Family history of adverse reaction to anesthesia    mother had post op N&V   GERD (gastroesophageal reflux disease)    History of hiatal hernia    Hyperlipidemia    Hypertension    Insomnia    Macular degeneration    Migraines    Neuropathy    Obesity    PAF (paroxysmal atrial fibrillation) ([place])    Pneumonia    Sleep apnea    history of it,not using a cpap, study 2018: didn't need cpap   Thyroid nodule 2000   radioactive caspule   Past Surgical History:  Procedure Laterality Date   ANTERIOR CERVICAL DECOMP/DISCECTOMY FUSION N/A 06/15/2020   Procedure: ANTERIOR CERVICAL DECOMPRESSION FUSION CERVICAL FIVE-CERVICAL SIX WITH INSTRUMENTATION AND ALLOGRAFT;  Surgeon: Phylliss Bob, MD;  Location: [place];  Service: Orthopedics;  Laterality: N/A;  anterior   CERVICAL POLYPECTOMY     INSIDE AND OUTSIDE   COLONOSCOPY     EYE SURGERY Bilateral    cataracts   POLYPECTOMY     REMOVED CARTLIAGE RIB       Current Outpatient Medications  Medication Sig Dispense Refill   acetaminophen (TYLENOL) 650 MG CR tablet Take 1,300 mg by mouth every 8 (eight) hours as needed for pain.     albuterol (VENTOLIN HFA) 108 (90 Base) MCG/ACT inhaler Inhale 2 puffs into the lungs every 6 (six) hours as needed for wheezing or shortness of breath. 1 each 6   apixaban (ELIQUIS) 5 MG TABS tablet Take 1 tablet (5 mg total) by mouth 2 (two) times daily. 60 tablet 10   BESIVANCE 0.6 % SUSP Place 1 drop into the left eye See admin instructions. Instill 1 drop into the left eye 4 times daily the day OF and day AFTER (eye injection)     Budeson-Glycopyrrol-Formoterol (BREZTRI AEROSPHERE) 160-9-4.8 MCG/ACT AERO Inhale 2 puffs into the lungs in the morning and at bedtime. 10.7 g 6   cetirizine (ZYRTEC) 10 MG tablet TAKE ONE TABLET BY MOUTH ONCE DAILY 30 tablet 4   Cyanocobalamin (VITAMIN B-12 PO) Take 2,500 mcg by mouth daily.     diltiazem (CARDIZEM CD) 240 MG 24 hr capsule TAKE ONE CAPSULE BY MOUTH EVERY MORNING 30 capsule 7   DULoxetine (CYMBALTA) 60 MG capsule Take 60 mg by mouth every morning.     flecainide (TAMBOCOR) 50 MG tablet TAKE ONE TABLET BY MOUTH AT BREAKFAST AND AT  BEDTIME 60 tablet 9   fluticasone (FLONASE) 50 MCG/ACT nasal spray Place 1 spray into both nostrils daily. 16 g 5   furosemide (LASIX) 40 MG tablet Take 1 tablet (40 mg total) by mouth daily. 30 tablet 0   ipratropium-albuterol (DUONEB) 0.5-2.5 (3) MG/3ML SOLN INHALE contents of ONE vial using nebulizer TWICE DAILY 180 mL 1   LORazepam (ATIVAN) 0.5 MG tablet Take 1-2 tablets (0.5-1 mg total) by mouth daily as needed for anxiety. Anxiety 15 tablet 0   metoprolol succinate (TOPROL-XL) 50 MG 24 hr tablet TAKE ONE TABLET BY MOUTH EVERYDAY AT BEDTIME 90 tablet 0   montelukast (SINGULAIR) 10 MG tablet Take 10 mg by mouth at bedtime.     Multiple Vitamins-Minerals (PRESERVISION AREDS 2+MULTI VIT) CAPS Take 1 tablet by mouth 2 (two) times daily.      pantoprazole (PROTONIX) 40 MG tablet Take 1 tablet (40 mg total) by mouth daily. 90 tablet 1   pravastatin (PRAVACHOL) 40 MG tablet Take 40 mg by mouth at bedtime.      No current facility-administered medications for this visit.    Allergies  Allergen Reactions   Food Shortness Of Breath    ALLERGY= MUSHROOMS   Levaquin [Levofloxacin In D5w] Other (See Comments)    "made me want to die."   Tequin Shortness Of Breath   Erythromycin Other (See Comments)    GI UPSET   Oxycodone-Acetaminophen Itching   Codeine Nausea And Vomiting   Penicillins Rash and Other (See Comments)    Reaction occurred during childhood   Prednisone Rash   Wellbutrin [Bupropion Hcl] Other (See Comments)    SHAKING    Social History   Socioeconomic History   Marital status: Divorced    Spouse name: Not on file   Number of children: 3   Years of education: College   Highest education level: Not on file  Occupational History   Occupation: Emergency planning/management officer  Tobacco Use   Smoking status: Former    Packs/day: 1.00    Years: 20.00    Total pack years: 20.00    Types: Cigarettes    Quit date: 05/21/1991    Years since quitting: 30.4   Smokeless tobacco: Never  Vaping Use   Vaping Use: Never used  Substance and Sexual Activity   Alcohol use: No   Drug use: No   Sexual activity: Not on file  Other Topics Concern   Not on file  Social History Narrative   Patient is divorced with 3 children.   Patient is right handed.   Patient has a college education.   Patient drinks 1-2 servings daily.   Social Determinants of Health   Financial Resource Strain: Not on file  Food Insecurity: No Food Insecurity (04/23/2021)   Hunger Vital Sign    Worried About Running Out of Food in the Last Year: Never true    Ran Out of Food in the Last Year: Never true  Transportation Needs: No Transportation Needs (04/23/2021)   PRAPARE - Hydrologist (Medical): No    Lack of Transportation  (Non-Medical): No  Physical Activity: Not on file  Stress: Not on file  Social Connections: Not on file  Intimate Partner Violence: Not on file     Review of Systems: All other systems reviewed and are otherwise negative except as noted above.  Physical Exam: Vitals:   10/26/21 1150  BP: 118/76  Pulse: 78  SpO2: 97%  Height: 5' 2.5" (1.588 m)  GEN- The patient is well appearing, alert and oriented x 3 today.   HEENT: normocephalic, atraumatic; sclera clear, conjunctiva pink; hearing intact; oropharynx clear; neck supple, no JVP Lymph- no cervical lymphadenopathy Lungs- Clear to ausculation bilaterally, normal work of breathing.  No wheezes, rales, rhonchi Heart- Regular rate and rhythm, no murmurs, rubs or gallops, PMI not laterally displaced GI- soft, non-tender, non-distended, bowel sounds present, no hepatosplenomegaly Extremities- no clubbing, cyanosis, or edema; DP/PT/radial pulses 2+ bilaterally MS- no significant deformity or atrophy Skin- warm and dry, no rash or lesion Psych- euthymic mood, full affect Neuro- strength and sensation are intact  EKG is ordered. Personal review of EKG from today shows NSR at 74 bpm  Additional studies reviewed include: Previous EP office notes.   Assessment and Plan:  1. Paroxysmal atrial fibrillation EKG today shows NSR at 74 bpm with stable intervals Continue flecainide 50 mg BID Continue diltiazem 240 mg daily Continue eliquis 5 mg BID Labs today.   2. Chronic diastolic CHF Echo 07/8180 99-37%, Severe mitral annular calcification  Follow up with Dr. Curt Bears in 12 months (sees gen cards in December)   Shirley Friar, PA-C  10/26/21 11:53 AM

## 2021-10-26 ENCOUNTER — Encounter: Payer: Self-pay | Admitting: Student

## 2021-10-26 ENCOUNTER — Ambulatory Visit (INDEPENDENT_AMBULATORY_CARE_PROVIDER_SITE_OTHER): Payer: Medicare Other | Admitting: Student

## 2021-10-26 VITALS — BP 118/76 | HR 78 | Ht 62.5 in

## 2021-10-26 DIAGNOSIS — I480 Paroxysmal atrial fibrillation: Secondary | ICD-10-CM

## 2021-10-26 DIAGNOSIS — I5032 Chronic diastolic (congestive) heart failure: Secondary | ICD-10-CM

## 2021-10-26 NOTE — Patient Instructions (Signed)
Medication Instructions:  Your physician recommends that you continue on your current medications as directed. Please refer to the Current Medication list given to you today.  *If you need a refill on your cardiac medications before your next appointment, please call your pharmacy*   Lab Work: TODAY: BMET, CBC  If you have labs (blood work) drawn today and your tests are completely normal, you will receive your results only by: MyChart Message (if you have MyChart) OR A paper copy in the mail If you have any lab test that is abnormal or we need to change your treatment, we will call you to review the results.  Follow-Up: At [facility], you and your health needs are our priority.  As part of our continuing mission to provide you with exceptional heart care, we have created designated Provider Care Teams.  These Care Teams include your primary Cardiologist (physician) and Advanced Practice Providers (APPs -  Physician Assistants and Nurse Practitioners) who all work together to provide you with the care you need, when you need it.  Your next appointment:   1 year(s)  The format for your next appointment:   In Person  Provider:   Will Camnitz, MD  

## 2021-10-27 LAB — BASIC METABOLIC PANEL
BUN/Creatinine Ratio: 22 (ref 12–28)
BUN: 17 mg/dL (ref 8–27)
CO2: 24 mmol/L (ref 20–29)
Calcium: 9.4 mg/dL (ref 8.7–10.3)
Chloride: 103 mmol/L (ref 96–106)
Creatinine, Ser: 0.76 mg/dL (ref 0.57–1.00)
Glucose: 117 mg/dL — ABNORMAL HIGH (ref 70–99)
Potassium: 4.1 mmol/L (ref 3.5–5.2)
Sodium: 146 mmol/L — ABNORMAL HIGH (ref 134–144)
eGFR: 81 mL/min/{1.73_m2} (ref >59)

## 2021-10-27 LAB — CBC
Hematocrit: 40.8 % (ref 34.0–46.6)
Hemoglobin: 13.1 g/dL (ref 11.1–15.9)
MCH: 26.2 pg — ABNORMAL LOW (ref 26.6–33.0)
MCHC: 32.1 g/dL (ref 31.5–35.7)
MCV: 82 fL (ref 79–97)
Platelets: 219 10*3/uL (ref 150–450)
RBC: 5 x10E6/uL (ref 3.77–5.28)
RDW: 13.5 % (ref 11.7–15.4)
WBC: 6.2 10*3/uL (ref 3.4–10.8)

## 2021-10-29 ENCOUNTER — Emergency Department (HOSPITAL_COMMUNITY): Payer: Medicare Other

## 2021-10-29 ENCOUNTER — Other Ambulatory Visit: Payer: Self-pay

## 2021-10-29 ENCOUNTER — Emergency Department (HOSPITAL_COMMUNITY)
Admission: EM | Admit: 2021-10-29 | Discharge: 2021-10-30 | Disposition: A | Discharge status: Home / Self Care | Payer: Medicare Other | Attending: Emergency Medicine | Admitting: Emergency Medicine

## 2021-10-29 DIAGNOSIS — Z7951 Long term (current) use of inhaled steroids: Secondary | ICD-10-CM | POA: Insufficient documentation

## 2021-10-29 DIAGNOSIS — Z7901 Long term (current) use of anticoagulants: Secondary | ICD-10-CM | POA: Diagnosis not present

## 2021-10-29 DIAGNOSIS — S8012XA Contusion of left lower leg, initial encounter: Secondary | ICD-10-CM | POA: Insufficient documentation

## 2021-10-29 DIAGNOSIS — Z79899 Other long term (current) drug therapy: Secondary | ICD-10-CM | POA: Diagnosis not present

## 2021-10-29 DIAGNOSIS — S0101XA Laceration without foreign body of scalp, initial encounter: Secondary | ICD-10-CM | POA: Insufficient documentation

## 2021-10-29 DIAGNOSIS — J449 Chronic obstructive pulmonary disease, unspecified: Secondary | ICD-10-CM | POA: Insufficient documentation

## 2021-10-29 DIAGNOSIS — W19XXXA Unspecified fall, initial encounter: Secondary | ICD-10-CM | POA: Diagnosis not present

## 2021-10-29 DIAGNOSIS — J45909 Unspecified asthma, uncomplicated: Secondary | ICD-10-CM | POA: Diagnosis not present

## 2021-10-29 DIAGNOSIS — S199XXA Unspecified injury of neck, initial encounter: Secondary | ICD-10-CM | POA: Diagnosis not present

## 2021-10-29 DIAGNOSIS — Z85828 Personal history of other malignant neoplasm of skin: Secondary | ICD-10-CM | POA: Diagnosis not present

## 2021-10-29 DIAGNOSIS — I110 Hypertensive heart disease with heart failure: Secondary | ICD-10-CM | POA: Diagnosis not present

## 2021-10-29 DIAGNOSIS — R58 Hemorrhage, not elsewhere classified: Secondary | ICD-10-CM | POA: Diagnosis not present

## 2021-10-29 DIAGNOSIS — R519 Headache, unspecified: Secondary | ICD-10-CM | POA: Diagnosis not present

## 2021-10-29 DIAGNOSIS — I5032 Chronic diastolic (congestive) heart failure: Secondary | ICD-10-CM | POA: Diagnosis not present

## 2021-10-29 DIAGNOSIS — M4322 Fusion of spine, cervical region: Secondary | ICD-10-CM | POA: Diagnosis not present

## 2021-10-29 DIAGNOSIS — Z981 Arthrodesis status: Secondary | ICD-10-CM | POA: Diagnosis not present

## 2021-10-29 DIAGNOSIS — Z043 Encounter for examination and observation following other accident: Secondary | ICD-10-CM | POA: Diagnosis not present

## 2021-10-29 DIAGNOSIS — S81812A Laceration without foreign body, left lower leg, initial encounter: Secondary | ICD-10-CM | POA: Diagnosis not present

## 2021-10-29 DIAGNOSIS — S0990XA Unspecified injury of head, initial encounter: Secondary | ICD-10-CM | POA: Diagnosis not present

## 2021-10-29 DIAGNOSIS — S81811A Laceration without foreign body, right lower leg, initial encounter: Secondary | ICD-10-CM | POA: Diagnosis not present

## 2021-10-29 DIAGNOSIS — I959 Hypotension, unspecified: Secondary | ICD-10-CM | POA: Diagnosis not present

## 2021-10-29 NOTE — ED Triage Notes (Signed)
Pt BIB EMS from home. Pt had unwitnessed fall going to bedroom, fell backwards and hit back of head - small lac to back of head - bleeding controlled. Pt on eliquis. No LOC. Pt also has bruise on left leg.  Pt is AO Vitals w EMS  132/82 HR 72 98% 2L  CBG 130

## 2021-10-29 NOTE — ED Notes (Signed)
Per MD Messick okay to hold off on labs and line at this time

## 2021-10-29 NOTE — Progress Notes (Signed)
Orthopedic Tech Progress Note Patient Details:  [name] Molina 13-Feb-1946 [id number]  Patient ID: [name], female   DOB: 1945/12/03, 76 y.o.   MRN: [id number] I attended trauma page. Karolee Stamps 10/29/2021, 11:29 PM

## 2021-10-29 NOTE — ED Notes (Signed)
Pt taken to CT with this RN.

## 2021-10-29 NOTE — ED Provider Notes (Signed)
[facility] EMERGENCY DEPARTMENT Provider Note  CSN: [id number] Arrival date & time: 10/29/21 2252  Chief Complaint(s) Fall on Thinners   HPI [name] Molina is a 76 y.o. female with a past medical history listed below including chronic diastolic heart failure, paroxysmal A-fib on Eliquis who had a mechanical fall at home after losing her balance trying to transition from room to room with her walker.  She reports hitting her head.  No loss of consciousness.  She is endorsing left leg pain after the fall.  Denies any neck pain or back pain.  No chest pain or abdominal pain.  No other physical complaints.  The history is provided by the patient.    Past Medical History Past Medical History:  Diagnosis Date   Anxiety    Aortic atherosclerosis (HCC)    Arthritis    Asthma    Atrial tachycardia (HCC)    Cancer (HCC)    basal skin cancer on nose   Cataract    Chronic diastolic CHF (congestive heart failure) (HCC)    Complication of anesthesia    during cervical surgery she woke up during the procedure   COPD (chronic obstructive pulmonary disease) ([place])    Depression    Diverticulosis    Family history of adverse reaction to anesthesia    mother had post op N&V   GERD (gastroesophageal reflux disease)    History of hiatal hernia    Hyperlipidemia    Hypertension    Insomnia    Macular degeneration    Migraines    Neuropathy    Obesity    PAF (paroxysmal atrial fibrillation) ([place])    Pneumonia    Sleep apnea    history of it,not using a cpap, study 2018: didn't need cpap   Thyroid nodule 2000   radioactive caspule   Patient Active Problem List   Diagnosis Date Noted   URI (upper respiratory infection) 06/04/2021   Chronic anticoagulation 04/30/2021   Sepsis due to pneumonia ([place]) 78/29/5621   Acute metabolic encephalopathy 30/86/5784   Transient hypotension 03/07/2021   DNR (do not resuscitate) 03/07/2021   Abnormal CT scan, chest 03/05/2021    Anxiety    Hypokalemia    Chronic respiratory failure with hypoxia ([place]) 09/29/2020   Paroxysmal atrial fibrillation ([place]) 08/13/2020   Major depressive disorder 08/13/2020   Chronic diastolic CHF (congestive heart failure) ([place]) 08/13/2020   Elevated troponin level not due myocardial infarction 08/13/2020   Edema of both lower extremities 05/07/2018   Pulmonary nodule 12/01/2017   Dyspnea 11/26/2017   RUQ abdominal pain 11/27/2015   Suprapubic pain 11/27/2015   Dysphagia 11/27/2015   Abdominal mass, RUQ (right upper quadrant) 11/27/2015   Obstructive sleep apnea 04/06/2015   PAT (paroxysmal atrial tachycardia) ([place]) 08/09/2014   GERD without esophagitis    Mixed hyperlipidemia    Spinal stenosis of lumbar region 03/16/2014   Idiopathic peripheral neuropathy 03/16/2014   Hypertension 06/13/2011   Allergic rhinitis 06/13/2011   Chronic headaches 06/13/2011   COPD (chronic obstructive pulmonary disease) ([place]) 06/13/2011   Home Medication(s) Prior to Admission medications   Medication Sig Start Date End Date Taking? Authorizing Provider  acetaminophen (TYLENOL) 650 MG CR tablet Take 1,300 mg by mouth every 8 (eight) hours as needed for pain.   Yes [provider]  albuterol (VENTOLIN HFA) 108 (90 Base) MCG/ACT inhaler Inhale 2 puffs into the lungs every 6 (six) hours as needed for wheezing or shortness of breath. 03/05/21  Yes  Parrett, Fonnie Mu, NP  apixaban (ELIQUIS) 5 MG TABS tablet Take 1 tablet (5 mg total) by mouth 2 (two) times daily. 04/03/21  Yes Camnitz, Will Hassell Done, MD  BESIVANCE 0.6 % SUSP Place 1 drop into the left eye See admin instructions. Instill 1 drop into the left eye 4 times daily the day OF and day AFTER (eye injection) 09/07/19  Yes [provider]  Budeson-Glycopyrrol-Formoterol (BREZTRI AEROSPHERE) 160-9-4.8 MCG/ACT AERO Inhale 2 puffs into the lungs in the morning and at bedtime. 07/11/21  Yes Cobb, Karie Schwalbe, NP  cetirizine (ZYRTEC) 10 MG  tablet TAKE ONE TABLET BY MOUTH ONCE DAILY Patient taking differently: Take 10 mg by mouth daily. 07/23/21  Yes Collene Gobble, MD  Cyanocobalamin (VITAMIN B-12 PO) Take 2,500 mcg by mouth daily.   Yes [provider]  diltiazem (CARDIZEM CD) 240 MG 24 hr capsule TAKE ONE CAPSULE BY MOUTH EVERY MORNING Patient taking differently: Take 240 mg by mouth daily. 07/23/21  Yes Camnitz, Will Hassell Done, MD  DULoxetine (CYMBALTA) 60 MG capsule Take 60 mg by mouth every morning. 03/23/20  Yes [provider]  flecainide (TAMBOCOR) 50 MG tablet TAKE ONE TABLET BY MOUTH AT BREAKFAST AND AT BEDTIME Patient taking differently: Take 50 mg by mouth 2 (two) times daily. 06/25/21  Yes Camnitz, Will Hassell Done, MD  fluticasone (FLONASE) 50 MCG/ACT nasal spray Place 1 spray into both nostrils daily. Patient taking differently: Place 1 spray into both nostrils daily as needed for allergies. 07/19/16  Yes Collene Gobble, MD  furosemide (LASIX) 40 MG tablet Take 1 tablet (40 mg total) by mouth daily. 11/18/20 10/30/21 Yes Donne Hazel, MD  ipratropium-albuterol (DUONEB) 0.5-2.5 (3) MG/3ML SOLN INHALE contents of ONE vial using nebulizer TWICE DAILY Patient taking differently: Take 3 mLs by nebulization every 4 (four) hours as needed (shortness of breath). 05/28/21  Yes Collene Gobble, MD  LORazepam (ATIVAN) 0.5 MG tablet Take 1-2 tablets (0.5-1 mg total) by mouth daily as needed for anxiety. Anxiety 03/15/21  Yes Amin, Jeanella Flattery, MD  metoprolol succinate (TOPROL-XL) 50 MG 24 hr tablet TAKE ONE TABLET BY MOUTH EVERYDAY AT BEDTIME Patient taking differently: Take 50 mg by mouth daily. 08/20/21  Yes Jerline Pain, MD  montelukast (SINGULAIR) 10 MG tablet Take 10 mg by mouth at bedtime. 02/21/14  Yes [provider]  Multiple Vitamins-Minerals (PRESERVISION AREDS 2+MULTI VIT) CAPS Take 1 tablet by mouth 2 (two) times daily.   Yes [provider]  OXYGEN Inhale 2 L into the lungs continuous.   Yes  [provider]  pantoprazole (PROTONIX) 40 MG tablet Take 1 tablet (40 mg total) by mouth daily. 07/11/21  Yes Cobb, Karie Schwalbe, NP  pravastatin (PRAVACHOL) 40 MG tablet Take 40 mg by mouth at bedtime.  09/04/10  Yes [provider]  Allergies Food, Levaquin [levofloxacin in d5w], Tequin, Erythromycin, Oxycodone-acetaminophen, Codeine, Penicillins, Prednisone, and Wellbutrin [bupropion hcl]  Review of Systems Review of Systems As noted in HPI  Physical Exam Vital Signs  I have reviewed the triage vital signs BP 102/68   Pulse 83   Temp 97.8 F (36.6 C) (Oral)   Resp 18   Ht 5' 2.5" (1.588 m)   Wt 97.5 kg   SpO2 97%   BMI 38.70 kg/m   Physical Exam Constitutional:      General: She is not in acute distress.    Appearance: She is well-developed. She is obese. She is not diaphoretic.  HENT:     Head: Normocephalic. Laceration present.      Right Ear: External ear normal.     Left Ear: External ear normal.     Nose: Nose normal.  Eyes:     General: No scleral icterus.       Right eye: No discharge.        Left eye: No discharge.     Conjunctiva/sclera: Conjunctivae normal.     Pupils: Pupils are equal, round, and reactive to light.  Cardiovascular:     Rate and Rhythm: Normal rate and regular rhythm.     Pulses:          Radial pulses are 2+ on the right side and 2+ on the left side.       Dorsalis pedis pulses are 2+ on the right side and 2+ on the left side.     Heart sounds: Normal heart sounds. No murmur heard.    No friction rub. No gallop.  Pulmonary:     Effort: Pulmonary effort is normal. No respiratory distress.     Breath sounds: Normal breath sounds. No stridor. No wheezing.  Abdominal:     General: There is no distension.     Palpations: Abdomen is soft.     Tenderness: There is no abdominal tenderness.   Musculoskeletal:     Cervical back: Normal range of motion and neck supple. No bony tenderness.     Thoracic back: No bony tenderness.     Lumbar back: No bony tenderness.     Right lower leg: No bony tenderness.     Left lower leg: Tenderness present. No bony tenderness.       Legs:     Comments: Clavicles stable. Chest stable to AP/Lat compression. Pelvis stable to Lat compression. No obvious extremity deformity. No chest or abdominal wall contusion.  Skin:    General: Skin is warm and dry.     Findings: No erythema or rash.  Neurological:     Mental Status: She is alert and oriented to person, place, and time.     Comments: Moving all extremities     ED Results and Treatments Labs (all labs ordered are listed, but only abnormal results are displayed) Labs Reviewed - No data to display  EKG  EKG Interpretation  Date/Time:    Ventricular Rate:    PR Interval:    QRS Duration:   QT Interval:    QTC Calculation:   R Axis:     Text Interpretation:         Radiology CT Head Wo Contrast  Result Date: 10/29/2021 CLINICAL DATA:  Trauma. EXAM: CT HEAD WITHOUT CONTRAST CT CERVICAL SPINE WITHOUT CONTRAST TECHNIQUE: Multidetector CT imaging of the head and cervical spine was performed following the standard protocol without intravenous contrast. Multiplanar CT image reconstructions of the cervical spine were also generated. RADIATION DOSE REDUCTION: This exam was performed according to the departmental dose-optimization program which includes automated exposure control, adjustment of the mA and/or kV according to patient size and/or use of iterative reconstruction technique. COMPARISON:  Head and C-spine CT dated 03/07/2021. FINDINGS: CT HEAD FINDINGS Brain: Mild age-related atrophy and chronic microvascular ischemic changes. There is no acute intracranial  hemorrhage. No mass effect or midline shift. No extra-axial fluid collection. Vascular: No hyperdense vessel or unexpected calcification. Skull: Normal. Negative for fracture or focal lesion. Sinuses/Orbits: No acute finding. Other: None CT CERVICAL SPINE FINDINGS Alignment: No acute subluxation. Skull base and vertebrae: No acute fracture.  Osteopenia. Soft tissues and spinal canal: No prevertebral fluid or swelling. No visible canal hematoma. Disc levels: C5-C6 ACDF and anterior fusion. The hardware appears intact. Upper chest: Negative. Other: Nodular appearing thyroid gland in keeping with known multinodular goiter. This has been evaluated on previous imaging. (ref: J Am Coll Radiol. 2015 Feb;12(2): 143-50). IMPRESSION: 1. No acute intracranial pathology. Mild age-related atrophy and chronic microvascular ischemic changes. 2. No acute cervical spine fracture or subluxation. C5-C6 ACDF and anterior fusion. Electronically Signed   By: Anner Crete M.D.   On: 10/29/2021 23:51   CT Cervical Spine Wo Contrast  Result Date: 10/29/2021 CLINICAL DATA:  Trauma. EXAM: CT HEAD WITHOUT CONTRAST CT CERVICAL SPINE WITHOUT CONTRAST TECHNIQUE: Multidetector CT imaging of the head and cervical spine was performed following the standard protocol without intravenous contrast. Multiplanar CT image reconstructions of the cervical spine were also generated. RADIATION DOSE REDUCTION: This exam was performed according to the departmental dose-optimization program which includes automated exposure control, adjustment of the mA and/or kV according to patient size and/or use of iterative reconstruction technique. COMPARISON:  Head and C-spine CT dated 03/07/2021. FINDINGS: CT HEAD FINDINGS Brain: Mild age-related atrophy and chronic microvascular ischemic changes. There is no acute intracranial hemorrhage. No mass effect or midline shift. No extra-axial fluid collection. Vascular: No hyperdense vessel or unexpected calcification.  Skull: Normal. Negative for fracture or focal lesion. Sinuses/Orbits: No acute finding. Other: None CT CERVICAL SPINE FINDINGS Alignment: No acute subluxation. Skull base and vertebrae: No acute fracture.  Osteopenia. Soft tissues and spinal canal: No prevertebral fluid or swelling. No visible canal hematoma. Disc levels: C5-C6 ACDF and anterior fusion. The hardware appears intact. Upper chest: Negative. Other: Nodular appearing thyroid gland in keeping with known multinodular goiter. This has been evaluated on previous imaging. (ref: J Am Coll Radiol. 2015 Feb;12(2): 143-50). IMPRESSION: 1. No acute intracranial pathology. Mild age-related atrophy and chronic microvascular ischemic changes. 2. No acute cervical spine fracture or subluxation. C5-C6 ACDF and anterior fusion. Electronically Signed   By: Anner Crete M.D.   On: 10/29/2021 23:51   DG Tibia/Fibula Left  Result Date: 10/29/2021 CLINICAL DATA:  Fall EXAM: LEFT TIBIA AND FIBULA - 2 VIEW COMPARISON:  None Available. FINDINGS: There is no evidence of fracture or other  focal bone lesions. Soft tissues are unremarkable. IMPRESSION: Negative. Electronically Signed   By: Rolm Baptise M.D.   On: 10/29/2021 23:13    Pertinent labs & imaging results that were available during my care of the patient were reviewed by me and considered in my medical decision making (see MDM for details).  Medications Ordered in ED Medications  fentaNYL (SUBLIMAZE) injection 50 mcg (50 mcg Intramuscular Given 10/30/21 0054)                                                                                                                                     Procedures .Marland KitchenLaceration Repair  Date/Time: 10/30/2021 1:09 AM  Performed by: Fatima Blank, MD Authorized by: Fatima Blank, MD   Consent:    Consent obtained:  Verbal   Consent given by:  Patient   Risks discussed:  Poor cosmetic result, poor wound healing and need for additional  repair Universal protocol:    Procedure explained and questions answered to patient or proxy's satisfaction: yes     Patient identity confirmed:  Arm band Anesthesia:    Anesthesia method:  None Laceration details:    Location:  Scalp   Scalp location:  Occipital   Length (cm):  1.1   Depth (mm):  4 Pre-procedure details:    Preparation:  Patient was prepped and draped in usual sterile fashion and imaging obtained to evaluate for foreign bodies Exploration:    Imaging obtained comment:  CT   Imaging outcome: foreign body not noted     Wound extent: no fascia violation noted, no foreign bodies/material noted and no vascular damage noted     Contaminated: no   Treatment:    Area cleansed with:  Saline   Amount of cleaning:  Standard   Irrigation solution:  Sterile saline   Irrigation volume:  500cc   Irrigation method:  Pressure wash   Debridement:  None Skin repair:    Repair method:  Tissue adhesive Approximation:    Approximation:  Close Repair type:    Repair type:  Simple Post-procedure details:    Procedure completion:  Tolerated   (including critical care time)  Medical Decision Making / ED Course    Complexity of Problem:  Co-morbidities/SDOH that complicate the patient evaluation/care: Noted in HPI   ED Course:  Clinical Course as of 10/30/21 0111  Mon Oct 29, 2021  2320 Patient presents after mechanical fall resulting in head trauma and patient is anticoagulated.  ABCs intact.  Secondary as above. Plan for targeted imaging including CT head and cervical spine as well as plain film of the left lower extremity.  No other injuries noted on exam requiring work-up. [PC]  Tue Oct 30, 2021  0035 CT head and cervical spine negative for any acute injuries.  Plain film of the left lower extremity without fracture or dislocation. [PC]  5621 Laceration irrigated and closed as above [PC]  Clinical Course User Index [PC] Leani Myron, Grayce Sessions, MD     Medical Decision Making Problems Addressed: Contusion of left leg, initial encounter: self-limited or minor problem Laceration of scalp, initial encounter: acute illness or injury  Amount and/or Complexity of Data Reviewed Radiology: ordered and independent interpretation performed. Decision-making details documented in ED Course.  Risk Prescription drug management. Decision regarding hospitalization.     Final Clinical Impression(s) / ED Diagnoses Final diagnoses:  Fall in home, initial encounter  Laceration of scalp, initial encounter  Contusion of left leg, initial encounter   The patient appears reasonably screened and/or stabilized for discharge and I doubt any other medical condition or other [facility] requiring further screening, evaluation, or treatment in the ED at this time prior to discharge. Safe for discharge with strict return precautions.  Disposition: Discharge  Condition: Good  I have discussed the results, Dx and Tx plan with the patient/family who expressed understanding and agree(s) with the plan. Discharge instructions discussed at length. The patient/family was given strict return precautions who verbalized understanding of the instructions. No further questions at time of discharge.    ED Discharge Orders     None       Follow Up: Harlan Stains, [address] [place] [id number] [phone]  Call  as needed           This chart was dictated using voice recognition software.  Despite best efforts to proofread,  errors can occur which can change the documentation meaning.    Fatima Blank, MD 10/30/21 [phone]

## 2021-10-30 DIAGNOSIS — S0101XA Laceration without foreign body of scalp, initial encounter: Secondary | ICD-10-CM | POA: Diagnosis not present

## 2021-10-30 MED ORDER — FENTANYL CITRATE PF 50 MCG/ML IJ SOSY
50.0000 ug | PREFILLED_SYRINGE | Freq: Once | INTRAMUSCULAR | Status: AC
  Administered 2021-10-30: 50 ug via INTRAMUSCULAR
  Filled 2021-10-30: qty 1

## 2021-10-30 NOTE — ED Notes (Addendum)
Patient verbalizes understanding of discharge instructions. Opportunity for questioning and answers were provided. Armband removed by staff, pt discharged from ED via wheelchair.  

## 2021-10-30 NOTE — ED Notes (Signed)
Pt calling daughter to pick her up.  

## 2021-10-30 NOTE — ED Notes (Signed)
MD Cardama at bedside to irrigate wound and dermabond

## 2021-10-31 DIAGNOSIS — I10 Essential (primary) hypertension: Secondary | ICD-10-CM | POA: Diagnosis not present

## 2021-10-31 DIAGNOSIS — I5033 Acute on chronic diastolic (congestive) heart failure: Secondary | ICD-10-CM | POA: Diagnosis not present

## 2021-10-31 DIAGNOSIS — F329 Major depressive disorder, single episode, unspecified: Secondary | ICD-10-CM | POA: Diagnosis not present

## 2021-10-31 DIAGNOSIS — J449 Chronic obstructive pulmonary disease, unspecified: Secondary | ICD-10-CM | POA: Diagnosis not present

## 2021-10-31 DIAGNOSIS — E785 Hyperlipidemia, unspecified: Secondary | ICD-10-CM | POA: Diagnosis not present

## 2021-10-31 DIAGNOSIS — K219 Gastro-esophageal reflux disease without esophagitis: Secondary | ICD-10-CM | POA: Diagnosis not present

## 2021-11-01 ENCOUNTER — Other Ambulatory Visit: Payer: Self-pay | Admitting: Cardiology

## 2021-11-19 DIAGNOSIS — I5033 Acute on chronic diastolic (congestive) heart failure: Secondary | ICD-10-CM | POA: Diagnosis not present

## 2021-11-19 DIAGNOSIS — I700 Atherosclerosis of aorta: Secondary | ICD-10-CM | POA: Diagnosis not present

## 2021-11-19 DIAGNOSIS — F329 Major depressive disorder, single episode, unspecified: Secondary | ICD-10-CM | POA: Diagnosis not present

## 2021-11-19 DIAGNOSIS — I10 Essential (primary) hypertension: Secondary | ICD-10-CM | POA: Diagnosis not present

## 2021-11-19 DIAGNOSIS — Z0000 Encounter for general adult medical examination without abnormal findings: Secondary | ICD-10-CM | POA: Diagnosis not present

## 2021-11-19 DIAGNOSIS — D6869 Other thrombophilia: Secondary | ICD-10-CM | POA: Diagnosis not present

## 2021-11-19 DIAGNOSIS — Z23 Encounter for immunization: Secondary | ICD-10-CM | POA: Diagnosis not present

## 2021-11-19 DIAGNOSIS — E538 Deficiency of other specified B group vitamins: Secondary | ICD-10-CM | POA: Diagnosis not present

## 2021-11-19 DIAGNOSIS — I480 Paroxysmal atrial fibrillation: Secondary | ICD-10-CM | POA: Diagnosis not present

## 2021-11-19 DIAGNOSIS — J9611 Chronic respiratory failure with hypoxia: Secondary | ICD-10-CM | POA: Diagnosis not present

## 2021-11-19 DIAGNOSIS — E785 Hyperlipidemia, unspecified: Secondary | ICD-10-CM | POA: Diagnosis not present

## 2021-11-19 DIAGNOSIS — J449 Chronic obstructive pulmonary disease, unspecified: Secondary | ICD-10-CM | POA: Diagnosis not present

## 2021-11-22 ENCOUNTER — Other Ambulatory Visit: Payer: Self-pay | Admitting: Family Medicine

## 2021-11-22 DIAGNOSIS — E2839 Other primary ovarian failure: Secondary | ICD-10-CM

## 2021-11-23 ENCOUNTER — Other Ambulatory Visit: Payer: Self-pay | Admitting: Cardiology

## 2021-11-23 DIAGNOSIS — I4891 Unspecified atrial fibrillation: Secondary | ICD-10-CM

## 2021-11-27 DIAGNOSIS — I5033 Acute on chronic diastolic (congestive) heart failure: Secondary | ICD-10-CM | POA: Diagnosis not present

## 2021-11-27 DIAGNOSIS — J449 Chronic obstructive pulmonary disease, unspecified: Secondary | ICD-10-CM | POA: Diagnosis not present

## 2021-11-27 DIAGNOSIS — I10 Essential (primary) hypertension: Secondary | ICD-10-CM | POA: Diagnosis not present

## 2021-11-27 DIAGNOSIS — E785 Hyperlipidemia, unspecified: Secondary | ICD-10-CM | POA: Diagnosis not present

## 2021-11-27 DIAGNOSIS — F329 Major depressive disorder, single episode, unspecified: Secondary | ICD-10-CM | POA: Diagnosis not present

## 2021-11-27 DIAGNOSIS — K219 Gastro-esophageal reflux disease without esophagitis: Secondary | ICD-10-CM | POA: Diagnosis not present

## 2021-12-02 DIAGNOSIS — Z23 Encounter for immunization: Secondary | ICD-10-CM | POA: Diagnosis not present

## 2021-12-11 ENCOUNTER — Other Ambulatory Visit: Payer: Self-pay | Admitting: Family Medicine

## 2021-12-11 DIAGNOSIS — Z1231 Encounter for screening mammogram for malignant neoplasm of breast: Secondary | ICD-10-CM

## 2021-12-23 ENCOUNTER — Other Ambulatory Visit: Payer: Self-pay | Admitting: Emergency Medicine

## 2022-01-07 DIAGNOSIS — I10 Essential (primary) hypertension: Secondary | ICD-10-CM | POA: Diagnosis not present

## 2022-01-07 DIAGNOSIS — E785 Hyperlipidemia, unspecified: Secondary | ICD-10-CM | POA: Diagnosis not present

## 2022-01-07 DIAGNOSIS — J449 Chronic obstructive pulmonary disease, unspecified: Secondary | ICD-10-CM | POA: Diagnosis not present

## 2022-01-07 DIAGNOSIS — F329 Major depressive disorder, single episode, unspecified: Secondary | ICD-10-CM | POA: Diagnosis not present

## 2022-01-07 DIAGNOSIS — K219 Gastro-esophageal reflux disease without esophagitis: Secondary | ICD-10-CM | POA: Diagnosis not present

## 2022-01-08 ENCOUNTER — Ambulatory Visit
Admission: RE | Admit: 2022-01-08 | Discharge: 2022-01-08 | Disposition: A | Discharge status: Home / Self Care | Payer: Medicare Other | Source: Ambulatory Visit | Attending: Family Medicine | Admitting: Family Medicine

## 2022-01-08 DIAGNOSIS — Z1231 Encounter for screening mammogram for malignant neoplasm of breast: Secondary | ICD-10-CM

## 2022-01-08 DIAGNOSIS — Z780 Asymptomatic menopausal state: Secondary | ICD-10-CM | POA: Diagnosis not present

## 2022-01-08 DIAGNOSIS — M85852 Other specified disorders of bone density and structure, left thigh: Secondary | ICD-10-CM | POA: Diagnosis not present

## 2022-01-08 DIAGNOSIS — E2839 Other primary ovarian failure: Secondary | ICD-10-CM

## 2022-01-28 ENCOUNTER — Telehealth: Payer: Self-pay | Admitting: *Deleted

## 2022-01-28 NOTE — Chronic Care Management (AMB) (Unsigned)
  Care Coordination  Outreach Note  01/28/2022 Name: [name] Molina MRN: [id number] DOB: 04/23/1946   Care Coordination Outreach Attempts: An unsuccessful telephone outreach was attempted today to offer the patient information about available care coordination services as a benefit of their health plan.   Follow Up Plan:  Additional outreach attempts will be made to offer the patient care coordination information and services.   Encounter Outcome:  No Answer  [place]  Direct Dial: [phone]

## 2022-01-29 NOTE — Chronic Care Management (AMB) (Signed)
  Care Coordination   Note   01/29/2022 Name: SHINIKA ESTELLE MRN: [id number] DOB: 1945-10-24  ZAINAH STEVEN is a 76 y.o. year old female who sees Harlan Stains, MD for primary care. I reached out to [name] by phone today to offer care coordination services.  Ms. Cowens was given information about Care Coordination services today including:   The Care Coordination services include support from the care team which includes your Nurse Coordinator, Clinical Social Worker, or Pharmacist.  The Care Coordination team is here to help remove barriers to the health concerns and goals most important to you. Care Coordination services are voluntary, and the patient may decline or stop services at any time by request to their care team member.   Care Coordination Consent Status: Patient agreed to services and verbal consent obtained.   Follow up plan:  Telephone appointment with care coordination team member scheduled for:  01/30/22  Encounter Outcome:  Pt. Scheduled  [place]  Direct Dial: [phone]

## 2022-01-30 ENCOUNTER — Ambulatory Visit: Payer: Self-pay

## 2022-01-30 NOTE — Patient Instructions (Signed)
Visit Information  Thank you for taking time to visit with me today. Please don't hesitate to contact me if I can be of assistance to you.   Following are the goals we discussed today:   Goals Addressed             This Visit's Progress    to monitor and manage my COPD and FALLS       Care Coordination Interventions: Active listening / Reflection utilized  Emotional Support Provided Problem [place] strategies reviewed Advised patient to self assesses COPD action plan zone and make appointment with provider if in the yellow zone for 48 hours without improvement Discussed the importance of adequate rest and management of fatigue with COPD Advised patient of importance of notifying provider of falls Assessed for falls since last encounter Assessed patients knowledge of fall risk prevention secondary to previously provided education I had a conversation with the patient, and she reported that she is doing well and not experiencing any breathing difficulties. According to her, she is currently in the green zone for her COPD and using 2-3 liters of oxygen. She checks her O2 saturation level every day, which usually ranges between 95-97%. Her bed is a Sleep Number that monitors her pulse, which was 68 last night. As for falls, there haven't been any recently. The last time she fell was on June 12th, resulting in a laceration to her scalp. She went to the emergency department where a head and cervical spine CT scan was performed, and no bleeding or fracture was found. Additionally, she had a bruise on her left tibia and fibula.        Our next appointment is by telephone on 10/13 at 11 am  Please call the care guide team at [phone] if you need to cancel or reschedule your appointment.   If you are experiencing a Mental Health or [place] or need someone to talk to, please call 1-800-273-TALK (toll free, 24 hour hotline)  Patient verbalizes understanding of  instructions and care plan provided today and agrees to view in [place]. Active MyChart status and patient understanding of how to access instructions and care plan via MyChart confirmed with patient.     Lazaro Arms RN, BSN, [facility]   Phone: [phone]

## 2022-01-30 NOTE — Patient Outreach (Signed)
  Care Coordination   Initial Visit Note   01/30/2022 Name: [name] Molina MRN: [id number] DOB: 1946/01/16  [name] Molina is a 76 y.o. year old female who sees [name] Stains, MD for primary care. I spoke with  [name] Molina by phone today.  What matters to the patients health and wellness today?  I feel good today noo breathing issue.    Goals Addressed             This Visit's Progress    to monitor and manage my COPD and FALLS       Care Coordination Interventions: Active listening / Reflection utilized  Emotional Support Provided Problem [place] strategies reviewed Advised patient to self assesses COPD action plan zone and make appointment with provider if in the yellow zone for 48 hours without improvement Discussed the importance of adequate rest and management of fatigue with COPD Advised patient of importance of notifying provider of falls Assessed for falls since last encounter Assessed patients knowledge of fall risk prevention secondary to previously provided education I had a conversation with the patient, and she reported that she is doing well and not experiencing any breathing difficulties. According to her, she is currently in the green zone for her COPD and using 2-3 liters of oxygen. She checks her O2 saturation level every day, which usually ranges between 95-97%. Her bed is a Sleep Number that monitors her pulse, which was 68 last night. As for falls, there haven't been any recently. The last time she fell was on June 12th, resulting in a laceration to her scalp. She went to the emergency department where a head and cervical spine CT scan was performed, and no bleeding or fracture was found. Additionally, she had a bruise on her left tibia and fibula.        SDOH assessments and interventions completed:  Yes  SDOH Interventions Today    Flowsheet Row Most Recent Value  SDOH Interventions   Food Insecurity Interventions Intervention Not Indicated   Transportation Interventions Intervention Not Indicated        Care Coordination Interventions Activated:  Yes  Care Coordination Interventions:  Yes, provided   Follow up plan: Follow up call scheduled for 10/13 11 am    Encounter Outcome:  Pt. Visit Completed   Lazaro Arms RN, BSN, [facility] [place] [facility]   Phone: [phone]

## 2022-02-22 DIAGNOSIS — I5033 Acute on chronic diastolic (congestive) heart failure: Secondary | ICD-10-CM | POA: Diagnosis not present

## 2022-02-22 DIAGNOSIS — I10 Essential (primary) hypertension: Secondary | ICD-10-CM | POA: Diagnosis not present

## 2022-02-22 DIAGNOSIS — E785 Hyperlipidemia, unspecified: Secondary | ICD-10-CM | POA: Diagnosis not present

## 2022-02-22 DIAGNOSIS — K219 Gastro-esophageal reflux disease without esophagitis: Secondary | ICD-10-CM | POA: Diagnosis not present

## 2022-02-22 DIAGNOSIS — J441 Chronic obstructive pulmonary disease with (acute) exacerbation: Secondary | ICD-10-CM | POA: Diagnosis not present

## 2022-03-01 ENCOUNTER — Ambulatory Visit: Payer: Self-pay

## 2022-03-01 NOTE — Patient Instructions (Signed)
Visit Information  Thank you for taking time to visit with me today. Please don't hesitate to contact me if I can be of assistance to you.   Following are the goals we discussed today:   Goals Addressed             This Visit's Progress    to monitor and manage my COPD and FALLS       Care Coordination Interventions: Active listening / Reflection utilized  Emotional Support Provided Problem [place] strategies reviewed Advised patient to self assesses COPD action plan zone and make appointment with provider if in the yellow zone for 48 hours without improvement Discussed the importance of adequate rest and management of fatigue with COPD Advised patient of importance of notifying provider of falls Assessed for falls since last encounter Assessed patients knowledge of fall risk prevention secondary to previously provided education I had a conversation with the patient and she is doing well today as she falls in the green zone. She mentioned that her oxygen saturation levels range between 95-97% and her pulse rate is 73. She is currently wearing her oxygen on 2 liters. She has been staying indoors for the most part, but she expressed her desire to go out for dinner with her friend. I advised her to adhere to preventive measures to protect herself from COVID-19, as cases are still occurring.        Our next appointment is by telephone on 11/15 at 10 am  Please call the care guide team at [phone] if you need to cancel or reschedule your appointment.   If you are experiencing a Mental Health or [place] or need someone to talk to, please call 1-800-273-TALK (toll free, 24 hour hotline)  Patient verbalizes understanding of instructions and care plan provided today and agrees to view in [place]. Active MyChart status and patient understanding of how to access instructions and care plan via MyChart confirmed with patient.     Lazaro Arms RN, BSN, [place] [facility]   Phone: [phone]

## 2022-03-01 NOTE — Patient Outreach (Signed)
  Care Coordination   Follow Up Visit Note   03/01/2022 Name: [name] Molina MRN: [id number] DOB: [birth year]  [name] Molina is a 76 y.o. year old female who sees Harlan Stains, MD for primary care. I spoke with  [name] by phone today.  What matters to the patients health and wellness today?  I am doing well today.    Goals Addressed             This Visit's Progress    to monitor and manage my COPD and FALLS       Care Coordination Interventions: Active listening / Reflection utilized  Emotional Support Provided Problem [place] strategies reviewed Advised patient to self assesses COPD action plan zone and make appointment with provider if in the yellow zone for 48 hours without improvement Discussed the importance of adequate rest and management of fatigue with COPD Advised patient of importance of notifying provider of falls Assessed for falls since last encounter Assessed patients knowledge of fall risk prevention secondary to previously provided education I had a conversation with the patient and she is doing well today as she falls in the green zone. She mentioned that her oxygen saturation levels range between 95-97% and her pulse rate is 73. She is currently wearing her oxygen on 2 liters. She has been staying indoors for the most part, but she expressed her desire to go out for dinner with her friend. I advised her to adhere to preventive measures to protect herself from COVID-19, as cases are still occurring.        SDOH assessments and interventions completed:  No     Care Coordination Interventions Activated:  Yes  Care Coordination Interventions:  Yes, provided   Follow up plan: Follow up call scheduled for 11/15 10 am    Encounter Outcome:  Pt. Visit Completed   Lazaro Arms RN, BSN, [place] [facility]   Phone: [phone]

## 2022-03-12 ENCOUNTER — Encounter (INDEPENDENT_AMBULATORY_CARE_PROVIDER_SITE_OTHER): Payer: Medicare Other | Admitting: Ophthalmology

## 2022-03-12 DIAGNOSIS — H353112 Nonexudative age-related macular degeneration, right eye, intermediate dry stage: Secondary | ICD-10-CM | POA: Diagnosis not present

## 2022-03-12 DIAGNOSIS — H353221 Exudative age-related macular degeneration, left eye, with active choroidal neovascularization: Secondary | ICD-10-CM | POA: Diagnosis not present

## 2022-03-12 DIAGNOSIS — I10 Essential (primary) hypertension: Secondary | ICD-10-CM

## 2022-03-12 DIAGNOSIS — H35033 Hypertensive retinopathy, bilateral: Secondary | ICD-10-CM

## 2022-03-12 DIAGNOSIS — H43813 Vitreous degeneration, bilateral: Secondary | ICD-10-CM

## 2022-03-21 ENCOUNTER — Other Ambulatory Visit: Payer: Self-pay | Admitting: Cardiology

## 2022-03-21 NOTE — Telephone Encounter (Signed)
Prescription refill request for Eliquis received. Indication:Afib Last office visit:6/23 Scr:0.7 Age: 75 Weight:97.5 kg  Prescription refilled

## 2022-03-26 DIAGNOSIS — I5033 Acute on chronic diastolic (congestive) heart failure: Secondary | ICD-10-CM | POA: Diagnosis not present

## 2022-03-26 DIAGNOSIS — K219 Gastro-esophageal reflux disease without esophagitis: Secondary | ICD-10-CM | POA: Diagnosis not present

## 2022-03-26 DIAGNOSIS — E785 Hyperlipidemia, unspecified: Secondary | ICD-10-CM | POA: Diagnosis not present

## 2022-03-26 DIAGNOSIS — I10 Essential (primary) hypertension: Secondary | ICD-10-CM | POA: Diagnosis not present

## 2022-03-26 DIAGNOSIS — J441 Chronic obstructive pulmonary disease with (acute) exacerbation: Secondary | ICD-10-CM | POA: Diagnosis not present

## 2022-04-02 ENCOUNTER — Telehealth: Payer: Self-pay | Admitting: *Deleted

## 2022-04-02 NOTE — Telephone Encounter (Signed)
  Care Coordination Note  04/02/2022 Name: [name] Molina MRN: [id number] DOB: 07-Jan-1946  [name] Molina is a 76 y.o. year old female who is a primary care patient of Harlan Stains, MD and is actively engaged with the care management team. I reached out to [name] by phone today to assist with re-scheduling a follow up visit with the RN Case Manager  Follow up plan: Telephone appointment with care management team member scheduled for:04/09/22  [place]  Direct Dial: [phone]

## 2022-04-02 NOTE — Progress Notes (Signed)
  Care Coordination Note  04/02/2022 Name: [name] Molina MRN: [id number] DOB: 17-May-1946  [name] Molina is a 76 y.o. year old female who is a primary care patient of Harlan Stains, MD and is actively engaged with the care management team. I reached out to [name] by phone today to assist with re-scheduling a follow up visit with the RN Case Manager  Follow up plan: Unsuccessful telephone outreach attempt made. A HIPAA compliant phone message was left for the patient providing contact information and requesting a return call.   [place]  Direct Dial: [phone]

## 2022-04-09 ENCOUNTER — Ambulatory Visit: Payer: Self-pay

## 2022-04-09 NOTE — Patient Instructions (Signed)
Visit Information  Thank you for taking time to visit with me today. Please don't hesitate to contact me if I can be of assistance to you.   Following are the goals we discussed today:   Goals Addressed             This Visit's Progress    to monitor and manage my COPD and FALLS       Care Coordination Interventions: Active listening / Reflection utilized  Emotional Support Provided Problem [place] strategies reviewed Advised patient to self assesses COPD action plan zone and make appointment with provider if in the yellow zone for 48 hours without improvement Discussed the importance of adequate rest and management of fatigue with COPD Advised patient of importance of notifying provider of falls Assessed for falls since last encounter Assessed patients knowledge of fall risk prevention secondary to previously provided education        Our next appointment is by telephone on 12/19 at 11 am  Please call the care guide team at [phone] if you need to cancel or reschedule your appointment.   If you are experiencing a Mental Health or [place] or need someone to talk to, please call 1-800-273-TALK (toll free, 24 hour hotline)  Patient verbalizes understanding of instructions and care plan provided today and agrees to view in [place]. Active MyChart status and patient understanding of how to access instructions and care plan via MyChart confirmed with patient.     Lazaro Arms RN, BSN, [place] [facility]   Phone: [phone]

## 2022-04-09 NOTE — Patient Outreach (Signed)
  Care Coordination   Follow Up Visit Note   04/09/2022 Name: [name] Molina MRN: [id number] DOB: 05-19-1946  [name] Molina is a 76 y.o. year old female who sees Harlan Stains, MD for primary care. I spoke with  [name] by phone today.  What matters to the patients health and wellness today?  [name] Molina is doing well, though she had an incident where she wasn't receiving enough oxygen. She called her daughter, who discovered that the line had been chewed up by her cats, causing holes in it. Once she changed the line, everything returned to normal. Currently, [name] Molina is eating and sleeping well, and her oxygen saturation level is at 94 no problems with her breathing.  She is taking her medications as precribed. She has an upcoming appointment with Dr. Marlou Porch on 04/25/22 at 1:20 PM.     Goals Addressed             This Visit's Progress    to monitor and manage my COPD and FALLS       Care Coordination Interventions: Active listening / Reflection utilized  Emotional Support Provided Problem [place] strategies reviewed Advised patient to self assesses COPD action plan zone and make appointment with provider if in the yellow zone for 48 hours without improvement Discussed the importance of adequate rest and management of fatigue with COPD Advised patient of importance of notifying provider of falls Assessed for falls since last encounter Assessed patients knowledge of fall risk prevention secondary to previously provided education        SDOH assessments and interventions completed:  No     Care Coordination Interventions Activated:  Yes  Care Coordination Interventions:  Yes, provided   Follow up plan: Follow up call scheduled for 12/19 11 am    Encounter Outcome:  Pt. Scheduled   Lazaro Arms RN, BSN, [facility] [place] [facility]   Phone: [phone]

## 2022-04-10 ENCOUNTER — Other Ambulatory Visit: Payer: Self-pay

## 2022-04-10 DIAGNOSIS — J449 Chronic obstructive pulmonary disease, unspecified: Secondary | ICD-10-CM

## 2022-04-10 MED ORDER — BREZTRI AEROSPHERE 160-9-4.8 MCG/ACT IN AERO: 2.0000 | INHALATION_SPRAY | Freq: Two times a day (BID) | RESPIRATORY_TRACT | 11 refills | Status: DC

## 2022-04-10 NOTE — Telephone Encounter (Signed)
Faxed completed Beztri application with signed Rx to [facility].

## 2022-04-22 ENCOUNTER — Other Ambulatory Visit: Payer: Self-pay | Admitting: Cardiology

## 2022-04-22 ENCOUNTER — Telehealth: Payer: Self-pay | Admitting: Emergency Medicine

## 2022-04-22 DIAGNOSIS — J449 Chronic obstructive pulmonary disease, unspecified: Secondary | ICD-10-CM

## 2022-04-24 MED ORDER — BREZTRI AEROSPHERE 160-9-4.8 MCG/ACT IN AERO: 2.0000 | INHALATION_SPRAY | Freq: Two times a day (BID) | RESPIRATORY_TRACT | 11 refills | Status: DC

## 2022-04-24 NOTE — Telephone Encounter (Signed)
Called and spoke with patient. Patient stated that she needed a new prescription for her breztri. Advised patient that I would print out the prescription and get it signed and send it over to [facility]. Prescription has been printed and signed.   Nothing further needed.

## 2022-04-25 ENCOUNTER — Encounter: Payer: Self-pay | Admitting: Cardiology

## 2022-04-25 ENCOUNTER — Ambulatory Visit: Payer: Medicare Other | Attending: Cardiology | Admitting: Cardiology

## 2022-04-25 VITALS — BP 110/70 | HR 84 | Ht 62.5 in | Wt 221.0 lb

## 2022-04-25 DIAGNOSIS — I480 Paroxysmal atrial fibrillation: Secondary | ICD-10-CM

## 2022-04-25 DIAGNOSIS — I5032 Chronic diastolic (congestive) heart failure: Secondary | ICD-10-CM | POA: Diagnosis not present

## 2022-04-25 DIAGNOSIS — Z7901 Long term (current) use of anticoagulants: Secondary | ICD-10-CM | POA: Diagnosis not present

## 2022-04-25 NOTE — Patient Instructions (Signed)
Medication Instructions:  The current medical regimen is effective;  continue present plan and medications.  *If you need a refill on your cardiac medications before your next appointment, please call your pharmacy*  Follow-Up: At [facility], you and your health needs are our priority.  As part of our continuing mission to provide you with exceptional heart care, we have created designated Provider Care Teams.  These Care Teams include your primary Cardiologist (physician) and Advanced Practice Providers (APPs -  Physician Assistants and Nurse Practitioners) who all work together to provide you with the care you need, when you need it.  We recommend signing up for the patient portal called "MyChart".  Sign up information is provided on this After Visit Summary.  MyChart is used to connect with patients for Virtual Visits (Telemedicine).  Patients are able to view lab/test results, encounter notes, upcoming appointments, etc.  Non-urgent messages can be sent to your provider as well.   To learn more about what you can do with MyChart, go to [email or web address].    Your next appointment:   1 year(s)  The format for your next appointment:   In Person  Provider:   Mark Skains, MD      Important Information About Sugar       

## 2022-04-25 NOTE — Progress Notes (Signed)
Cardiology Office Note:    Date:  04/25/2022   ID:  [name] Molina, [name] Molina 1945/09/04, MRN [id number]  PCP:  Harlan Stains, MD   [facility] Providers Cardiologist:  Candee Furbish, MD Electrophysiologist:  Will Meredith Leeds, MD     Referring MD: Harlan Stains, MD    History of Present Illness:    [name] Molina is a 76 y.o. female here for follow-up of atrial fibrillation.  Previously seen Dr. Curt Bears.  Has COPD diastolic heart failure anxiety.  Back in June 2022 was hospitalized with rapid atrial fibrillation.  She had been on Eliquis.  Flecainide was started during this hospitalization.  She has not had any further significant episodes of atrial fibrillation.  She has seen pulmonary because of prior COPD and shortness of breath especially following COVID in September 2022.  She ended up going to rehab.  She did have a fall recently.  No chest pain.  Wears oxygen usually at night.  Doing well.   Past Medical History:  Diagnosis Date   Anxiety    Aortic atherosclerosis (HCC)    Arthritis    Asthma    Atrial tachycardia    Cancer (HCC)    basal skin cancer on nose   Cataract    Chronic diastolic CHF (congestive heart failure) (HCC)    Complication of anesthesia    during cervical surgery she woke up during the procedure   COPD (chronic obstructive pulmonary disease) (HCC)    Depression    Diverticulosis    Family history of adverse reaction to anesthesia    mother had post op N&V   GERD (gastroesophageal reflux disease)    History of hiatal hernia    Hyperlipidemia    Hypertension    Insomnia    Macular degeneration    Migraines    Neuropathy    Obesity    PAF (paroxysmal atrial fibrillation) (HCC)    Pneumonia    Sleep apnea    history of it,not using a cpap, study 2018: didn't need cpap   Thyroid nodule 2000   radioactive caspule    Past Surgical History:  Procedure Laterality Date   ANTERIOR CERVICAL DECOMP/DISCECTOMY FUSION N/A 06/15/2020   Procedure:  ANTERIOR CERVICAL DECOMPRESSION FUSION CERVICAL FIVE-CERVICAL SIX WITH INSTRUMENTATION AND ALLOGRAFT;  Surgeon: Phylliss Bob, MD;  Location: [facility];  Service: Orthopedics;  Laterality: N/A;  anterior   CERVICAL POLYPECTOMY     INSIDE AND OUTSIDE   COLONOSCOPY     EYE SURGERY Bilateral    cataracts   POLYPECTOMY     REMOVED CARTLIAGE RIB      Current Medications: Current Meds  Medication Sig   acetaminophen (TYLENOL) 650 MG CR tablet Take 1,300 mg by mouth every 8 (eight) hours as needed for pain.   albuterol (VENTOLIN HFA) 108 (90 Base) MCG/ACT inhaler Inhale 2 puffs into the lungs every 6 (six) hours as needed for wheezing or shortness of breath.   ALLERGY RELIEF CETIRIZINE 10 MG tablet TAKE ONE TABLET BY MOUTH ONCE DAILY   BESIVANCE 0.6 % SUSP Place 1 drop into the left eye See admin instructions. Instill 1 drop into the left eye 4 times daily the day OF and day AFTER (eye injection)   Budeson-Glycopyrrol-Formoterol (BREZTRI AEROSPHERE) 160-9-4.8 MCG/ACT AERO Inhale 2 puffs into the lungs in the morning and at bedtime.   Cholecalciferol 50 MCG (2000 UT) CAPS Take by mouth.   Cyanocobalamin (VITAMIN B-12 PO) Take 2,500 mcg by mouth daily.   diltiazem (  CARDIZEM CD) 240 MG 24 hr capsule TAKE ONE CAPSULE BY MOUTH EVERY MORNING   DULoxetine (CYMBALTA) 60 MG capsule Take 60 mg by mouth every morning.   ELIQUIS 5 MG TABS tablet TAKE ONE TABLET BY MOUTH EVERY MORNING and TAKE ONE TABLET BY MOUTH EVERYDAY AT BEDTIME   flecainide (TAMBOCOR) 50 MG tablet TAKE ONE TABLET BY MOUTH AT BREAKFAST AND AT BEDTIME   fluticasone (FLONASE) 50 MCG/ACT nasal spray Place 1 spray into both nostrils daily.   furosemide (LASIX) 40 MG tablet TAKE ONE TABLET BY MOUTH EVERY MORNING   ipratropium-albuterol (DUONEB) 0.5-2.5 (3) MG/3ML SOLN INHALE contents of ONE vial using nebulizer TWICE DAILY (Patient taking differently: Take 3 mLs by nebulization every 4 (four) hours as needed (shortness of breath).)   LORazepam  (ATIVAN) 0.5 MG tablet Take 1-2 tablets (0.5-1 mg total) by mouth daily as needed for anxiety. Anxiety   metoprolol succinate (TOPROL-XL) 50 MG 24 hr tablet TAKE ONE TABLET BY MOUTH EVERYDAY AT BEDTIME   montelukast (SINGULAIR) 10 MG tablet Take 10 mg by mouth at bedtime.   Multiple Vitamins-Minerals (PRESERVISION AREDS 2+MULTI VIT) CAPS Take 1 tablet by mouth 2 (two) times daily.   OXYGEN Inhale 2 L into the lungs continuous.   pantoprazole (PROTONIX) 40 MG tablet Take 1 tablet (40 mg total) by mouth daily.   pravastatin (PRAVACHOL) 40 MG tablet Take 40 mg by mouth at bedtime.      Allergies:   Food, Levaquin [levofloxacin in d5w], Tequin, Erythromycin, Oxycodone-acetaminophen, Codeine, Penicillins, Prednisone, and Wellbutrin [bupropion hcl]   Social History   Socioeconomic History   Marital status: Divorced    Spouse name: Not on file   Number of children: 3   Years of education: College   Highest education level: Not on file  Occupational History   Occupation: Emergency planning/management officer  Tobacco Use   Smoking status: Former    Packs/day: 1.00    Years: 20.00    Total pack years: 20.00    Types: Cigarettes    Quit date: 05/21/1991    Years since quitting: 30.9   Smokeless tobacco: Never  Vaping Use   Vaping Use: Never used  Substance and Sexual Activity   Alcohol use: No   Drug use: No   Sexual activity: Not on file  Other Topics Concern   Not on file  Social History Narrative   Patient is divorced with 3 children.   Patient is right handed.   Patient has a college education.   Patient drinks 1-2 servings daily.   Social Determinants of Health   Financial Resource Strain: Not on file  Food Insecurity: No Food Insecurity (04/23/2021)   Hunger Vital Sign    Worried About Running Out of Food in the Last Year: Never true    Ran Out of Food in the Last Year: Never true  Transportation Needs: No Transportation Needs (04/23/2021)   PRAPARE - Radiographer, therapeutic (Medical): No    Lack of Transportation (Non-Medical): No  Physical Activity: Not on file  Stress: Not on file  Social Connections: Not on file     Family History: The patient's family history includes Asthma in her father; Breast cancer (age of onset: 77) in her maternal grandmother; Colon polyps (age of onset: 73) in her sister; Emphysema in her father; Heart disease in her father and paternal grandfather. There is no history of Colon cancer.  ROS:   Please see the history of present illness.  All other systems reviewed and are negative.  EKGs/Labs/Other Studies Reviewed:    The following studies were reviewed today:  Echocardiogram 11/16/2020:   1. Afib with RVR noted during the study.   2. Left ventricular ejection fraction, by estimation, is 65 to 70%. The  left ventricle has normal function. The left ventricle has no regional  wall motion abnormalities. There is moderate concentric left ventricular  hypertrophy. Left ventricular  diastolic function could not be evaluated.   3. Prominent RV epicardial fat. Right ventricular systolic function is  normal. The right ventricular size is normal. Tricuspid regurgitation  signal is inadequate for assessing PA pressure.   4. Left atrial size was mild to moderately dilated.   5. The mitral valve is degenerative. Trivial mitral valve regurgitation.  Mild mitral stenosis. The mean mitral valve gradient is 5.3 mmHg with  average heart rate of 106 bpm. Severe mitral annular calcification.   6. The aortic valve is grossly normal. Aortic valve regurgitation is not  visualized. No aortic stenosis is present.   7. The inferior vena cava is dilated in size with >50% respiratory  variability, suggesting right atrial pressure of 8 mmHg.     EKG: Sinus rhythm 73 previously reviewed.  03/13/21 showed sinus tachycardia 135.  QRS normal duration.  Recent Labs: 06/18/2021: Pro B Natriuretic peptide (BNP) 92.0 10/26/2021: BUN 17;  Creatinine, Ser 0.76; Hemoglobin 13.1; Platelets 219; Potassium 4.1; Sodium 146  Recent Lipid Panel No results found for: "CHOL", "TRIG", "HDL", "CHOLHDL", "VLDL", "LDLCALC", "LDLDIRECT"   Risk Assessment/Calculations:              Physical Exam:    VS:  BP 110/70 (BP Location: Left Arm, Patient Position: Sitting, Cuff Size: Normal)   Pulse 84   Ht 5' 2.5" (1.588 m)   Wt 221 lb (100.2 kg)   SpO2 96%   BMI 39.78 kg/m     Wt Readings from Last 3 Encounters:  04/25/22 221 lb (100.2 kg)  10/29/21 215 lb (97.5 kg)  10/18/21 220 lb (99.8 kg)     GEN:  Well nourished, well developed in no acute distress HEENT: Normal NECK: No JVD; No carotid bruits LYMPHATICS: No lymphadenopathy CARDIAC: RRR, no murmurs, no rubs, gallops RESPIRATORY:  Clear to auscultation without rales, wheezing or rhonchi  ABDOMEN: Soft, non-tender, non-distended MUSCULOSKELETAL:  No edema; No deformity  SKIN: Warm and dry NEUROLOGIC:  Alert and oriented x 3 PSYCHIATRIC:  Normal affect   ASSESSMENT:    1. Paroxysmal atrial fibrillation (HCC)   2. Chronic diastolic CHF (congestive heart failure) ([place])   3. Chronic anticoagulation     PLAN:    In order of problems listed above:  Paroxysmal atrial fibrillation (HCC) Flecainide 50 mg twice a day as well as diltiazem 240 mg daily.  Dr. Curt Bears has seen.  High risk medication monitoring continued. Remaining in sinus rhythm.  With fatigue she tried diltiazem in the morning and metoprolol in the evening.  CHADSVASc 5.  Eliquis 5 mg twice a day.  Overall been doing well no changes made.  Uses wheelchair.  Trouble getting up.   Chronic anticoagulation Continue with Eliquis 5 mg twice a day.  No bleeding.  Monitor high risk medication with labs, EKG. overall hemoglobin 13.1 creatinine 0.76 stable.   Chronic diastolic CHF (congestive heart failure) (HCC) Has severe mitral annular calcification EF 70% mean mitral valve gradient is 5 mmHg.  Stable.  No  hospitalizations.  Continuing with Cardizem 240 Toprol 50  COPD (chronic obstructive pulmonary disease) ([place]) Appreciate pulmonary assistance.  Tammy Parrott note reviewed.  She is seeing Dr. Lamonte Sakai soon.       Medication Adjustments/Labs and Tests Ordered: Current medicines are reviewed at length with the patient today.  Concerns regarding medicines are outlined above.  No orders of the defined types were placed in this encounter.   No orders of the defined types were placed in this encounter.    Patient Instructions  Medication Instructions:  The current medical regimen is effective;  continue present plan and medications.  *If you need a refill on your cardiac medications before your next appointment, please call your pharmacy*  Follow-Up: At [facility], you and your health needs are our priority.  As part of our continuing mission to provide you with exceptional heart care, we have created designated Provider Care Teams.  These Care Teams include your primary Cardiologist (physician) and Advanced Practice Providers (APPs -  Physician Assistants and Nurse Practitioners) who all work together to provide you with the care you need, when you need it.  We recommend signing up for the patient portal called "MyChart".  Sign up information is provided on this After Visit Summary.  MyChart is used to connect with patients for Virtual Visits (Telemedicine).  Patients are able to view lab/test results, encounter notes, upcoming appointments, etc.  Non-urgent messages can be sent to your provider as well.   To learn more about what you can do with MyChart, go to [email or web address].    Your next appointment:   1 year(s)  The format for your next appointment:   In Person  Provider:   Candee Furbish, MD       Important Information About [place]         Signed, Candee Furbish, MD  04/25/2022 2:12 PM    [facility]

## 2022-04-29 DIAGNOSIS — I5032 Chronic diastolic (congestive) heart failure: Secondary | ICD-10-CM | POA: Diagnosis not present

## 2022-04-29 DIAGNOSIS — D6869 Other thrombophilia: Secondary | ICD-10-CM | POA: Diagnosis not present

## 2022-04-29 DIAGNOSIS — E785 Hyperlipidemia, unspecified: Secondary | ICD-10-CM | POA: Diagnosis not present

## 2022-04-29 DIAGNOSIS — J449 Chronic obstructive pulmonary disease, unspecified: Secondary | ICD-10-CM | POA: Diagnosis not present

## 2022-04-29 DIAGNOSIS — F329 Major depressive disorder, single episode, unspecified: Secondary | ICD-10-CM | POA: Diagnosis not present

## 2022-04-29 DIAGNOSIS — F419 Anxiety disorder, unspecified: Secondary | ICD-10-CM | POA: Diagnosis not present

## 2022-04-29 DIAGNOSIS — I700 Atherosclerosis of aorta: Secondary | ICD-10-CM | POA: Diagnosis not present

## 2022-04-29 DIAGNOSIS — I10 Essential (primary) hypertension: Secondary | ICD-10-CM | POA: Diagnosis not present

## 2022-04-29 DIAGNOSIS — Z23 Encounter for immunization: Secondary | ICD-10-CM | POA: Diagnosis not present

## 2022-04-29 DIAGNOSIS — J9611 Chronic respiratory failure with hypoxia: Secondary | ICD-10-CM | POA: Diagnosis not present

## 2022-04-29 DIAGNOSIS — I480 Paroxysmal atrial fibrillation: Secondary | ICD-10-CM | POA: Diagnosis not present

## 2022-05-01 ENCOUNTER — Ambulatory Visit (INDEPENDENT_AMBULATORY_CARE_PROVIDER_SITE_OTHER): Payer: Medicare Other | Admitting: Emergency Medicine

## 2022-05-01 ENCOUNTER — Encounter: Payer: Self-pay | Admitting: Emergency Medicine

## 2022-05-01 VITALS — BP 138/72 | HR 77 | Temp 98.0°F | Ht 62.0 in | Wt 221.0 lb

## 2022-05-01 DIAGNOSIS — J449 Chronic obstructive pulmonary disease, unspecified: Secondary | ICD-10-CM

## 2022-05-01 DIAGNOSIS — J301 Allergic rhinitis due to pollen: Secondary | ICD-10-CM | POA: Diagnosis not present

## 2022-05-01 DIAGNOSIS — J9611 Chronic respiratory failure with hypoxia: Secondary | ICD-10-CM | POA: Diagnosis not present

## 2022-05-01 DIAGNOSIS — K219 Gastro-esophageal reflux disease without esophagitis: Secondary | ICD-10-CM

## 2022-05-01 MED ORDER — BREZTRI AEROSPHERE 160-9-4.8 MCG/ACT IN AERO: 2.0000 | INHALATION_SPRAY | Freq: Two times a day (BID) | RESPIRATORY_TRACT | 11 refills | Status: DC

## 2022-05-01 NOTE — Assessment & Plan Note (Signed)
Continue your oxygen at 2-3 L/min depending on your level of exertion. 

## 2022-05-01 NOTE — Assessment & Plan Note (Signed)
Continue your Zyrtec and Singulair as you have been taking them. Keep Flonase available to use 2 sprays each nostril if needed for congestion and drainage

## 2022-05-01 NOTE — Patient Instructions (Addendum)
Please continue Breztri 2 puffs twice a day.  Rinse and gargle after using. Keep your albuterol available to use 2 puffs when you needed for shortness of breath, chest tightness, wheezing. Keep DuoNeb available to use if you needed for shortness of breath Flu shot up-to-date. Get the COVID-19 booster and the RSV vaccine as planned Continue your oxygen at 2-3 L/min depending on your level of exertion. Continue your Zyrtec and Singulair as you have been taking them. Keep Flonase available to use 2 sprays each nostril if needed for congestion and drainage Continue your Protonix as ordered Follow with Dr. Lamonte Sakai in 12 months or sooner if you have any problems.

## 2022-05-01 NOTE — Assessment & Plan Note (Signed)
Continue your Protonix as ordered

## 2022-05-01 NOTE — Assessment & Plan Note (Signed)
She has been doing quite well.  She likes the [place], has improved her breathing.  No flares.  Please continue Breztri 2 puffs twice a day.  Rinse and gargle after using. Keep your albuterol available to use 2 puffs when you needed for shortness of breath, chest tightness, wheezing. Keep DuoNeb available to use if you needed for shortness of breath Flu shot up-to-date. Get the COVID-19 booster and the RSV vaccine as planned Follow with Dr. Lamonte Sakai in 12 months or sooner if you have any problems.

## 2022-05-01 NOTE — Progress Notes (Signed)
Subjective:    Patient ID: [name] Molina, female    DOB: 1945-07-25, 76 y.o.   MRN: [id number] HPI  ROV 05/24/21 --[name] Molina is 33 and has a history of COPD/fixed asthma as well as chronic cough in the setting of allergic rhinitis and GERD.  She had COVID-19 in October and was hospitalized after a fall. Treated for PNA and AE-COPD.  She also received some extra lasix for associated LE edema. She has gained some strength with rehab. She is having increased cough for last few days, had a fever last 2 days. Also some increased exertional SOB.  She has been managed on Trelegy, uses albuterol or DuoNeb, increased use over the last 2 days.  Remains on Singulair, fluticasone nasal spray  ROV 08/24/21 --[name] Molina is 61 and follows up today for her history of COPD/fixed asthma.  She has chronic recurrent cough in the setting of allergic rhinitis, GERD.  Also with diastolic CHF from hypertension, atrial fibrillation.  She has chronic hypoxemic respiratory failure and is on 2 L/min via POC.  She was treated for flare of her COPD/asthma in February.  Chest x-ray showed acute on chronic right lower lobe infiltrate prompting repeat CT chest as below.  Currently on Breztri, duoneb a few times a week. Albuterol most days.  Zyrtec, Flonase as needed, Singulair, Protonix  CT chest 08/15/2021 reviewed by me shows no significant hilar nodes, some mildly prominent mediastinal nodes that are unchanged, mild centrilobular emphysema with interval improvement in aeration of both lung bases, mild subsegmental atelectatic change, no concerning nodules or infiltrates.   ROV 05/01/22 --follow-up visit for 76 year old woman with a history of COPD and fixed asthma, chronic cough.  PMH significant for hypertension and A-fib with diastolic CHF, allergic rhinitis, GERD.  She has chronic hypoxemic respiratory failure and uses oxygen at liters per minute. Currently managed on Breztri.  She uses DuoNeb very little - every few months. Uses  albuterol every few weeks. No flares since last time.  Flu shot up to date. She is planning to get COVID and RSV vaccines.  Zyrtec, Flonase as needed, Singulair, Protonix      Objective:   Physical Exam Vitals:   05/01/22 1117  BP: 138/72  Pulse: 77  Temp: 98 F (36.7 C)  TempSrc: Oral  SpO2: 97%  Weight: 221 lb (100.2 kg)  Height: '5\' 2"'$  (1.575 m)    Gen: Pleasant, overweight woman, in no distress,  normal affect, wheelchair  ENT: No lesions,  mouth clear,  oropharynx clear, no postnasal drip  Neck: No JVD, no stridor  Lungs: No use of accessory muscles, distant, no wheeze  Cardiovascular: RRR, heart sounds normal, no murmur or gallops  Musculoskeletal: no deformities  Neuro: alert, non focal  Skin: Warm and dry w some scaling, no lesions or rashes     Assessment & Plan:  COPD (chronic obstructive pulmonary disease) ([place]) She has been doing quite well.  She likes the [place], has improved her breathing.  No flares.  Please continue Breztri 2 puffs twice a day.  Rinse and gargle after using. Keep your albuterol available to use 2 puffs when you needed for shortness of breath, chest tightness, wheezing. Keep DuoNeb available to use if you needed for shortness of breath Flu shot up-to-date. Get the COVID-19 booster and the RSV vaccine as planned Follow with Dr. Lamonte Sakai in 12 months or sooner if you have any problems.   Allergic rhinitis Continue your Zyrtec and Singulair as you have been  taking them. Keep Flonase available to use 2 sprays each nostril if needed for congestion and drainage  Chronic respiratory failure with hypoxia ([place]) Continue your oxygen at 2-3 L/min depending on your level of exertion.  GERD without esophagitis Continue your Protonix as ordered   Baltazar Apo, MD, PhD 05/01/2022, 11:32 AM [facility] [phone] or if no answer [phone]

## 2022-05-07 ENCOUNTER — Ambulatory Visit: Payer: Self-pay

## 2022-05-07 NOTE — Patient Outreach (Signed)
  Care Coordination   Follow Up Visit Note   05/07/2022 Name: [name] Molina MRN: [id number] DOB: 1945/12/07  [name] Molina is a 76 y.o. year old female who sees Harlan Stains, MD for primary care. I spoke with  [name] by phone today.  What matters to the patients health and wellness today?  I am doing well    Goals Addressed             This Visit's Progress    to monitor and manage my COPD and FALLS       Care Coordination Interventions: Active listening / Reflection utilized  Emotional Support Provided Problem [place] strategies reviewed Advised patient to self assesses COPD action plan zone and make appointment with provider if in the yellow zone for 48 hours without improvement Discussed the importance of adequate rest and management of fatigue with COPD Advised patient of importance of notifying provider of falls Assessed for falls since last encounter Assessed patients knowledge of fall risk prevention secondary to previously provided education [name] Molina's health is doing great, and she has seen all the physicians she will follow up with - Dr. Marlou Porch and Dr. Lamonte Sakai - within a year and, in  March, with Dr. Dema Severin for blood work. [name] Molina mentioned that her breathing is in the green zone, and her oxygen level is at 3 liters, with an O2 saturation of 96%. We also discussed the protocol, and she knows that if her breathing enters the yellow area, she should contact her doctors for assistance to prevent any further complications.        SDOH assessments and interventions completed:  No     Care Coordination Interventions:  Yes, provided   Follow up plan: Follow up call scheduled for 1/25  11    Encounter Outcome:  Pt. Visit Completed   Lazaro Arms RN, BSN, [facility]   Phone: [phone]

## 2022-05-07 NOTE — Patient Instructions (Signed)
Visit Information  Thank you for taking time to visit with me today. Please don't hesitate to contact me if I can be of assistance to you.   Following are the goals we discussed today:   Goals Addressed             This Visit's Progress    to monitor and manage my COPD and FALLS       Care Coordination Interventions: Active listening / Reflection utilized  Emotional Support Provided Problem [place] strategies reviewed Advised patient to self assesses COPD action plan zone and make appointment with provider if in the yellow zone for 48 hours without improvement Discussed the importance of adequate rest and management of fatigue with COPD Advised patient of importance of notifying provider of falls Assessed for falls since last encounter Assessed patients knowledge of fall risk prevention secondary to previously provided education Mrs. Mcdade's health is doing great, and she has seen all the physicians she will follow up with - Dr. Marlou Porch and Dr. Lamonte Sakai - within a year and, in  March, with Dr. Dema Severin for blood work. Mrs. Vanevery mentioned that her breathing is in the green zone, and her oxygen level is at 3 liters, with an O2 saturation of 96%. We also discussed the protocol, and she knows that if her breathing enters the yellow area, she should contact her doctors for assistance to prevent any further complications.        Our next appointment is by telephone on 1/25 at 11 am  Please call the care guide team at [phone] if you need to cancel or reschedule your appointment.   If you are experiencing a Mental Health or [place] or need someone to talk to, please call 1-800-273-TALK (toll free, 24 hour hotline)  Patient verbalizes understanding of instructions and care plan provided today and agrees to view in [place]. Active MyChart status and patient understanding of how to access instructions and care plan via MyChart confirmed with patient.     Lazaro Arms RN, BSN, [facility]   Phone: [phone]

## 2022-05-16 ENCOUNTER — Telehealth: Payer: Self-pay | Admitting: Emergency Medicine

## 2022-05-16 NOTE — Telephone Encounter (Signed)
Spoke with pt who states was exposed to Covid 2 days ago. Since that time pt has a sore throat and "hacky" cough. Pt denies fever/ chills/ GI upset. Pt has not taken a home Covid test as of yet. Pt was instructed to use Delsym for cough and tylenol for pain. Pt stated understanding. Pt stated if she started to run a fever or symptoms got worse she would call the office or go to UC. Nothing further needed at this time.

## 2022-05-18 ENCOUNTER — Telehealth: Payer: Self-pay | Admitting: Pulmonary Disease

## 2022-05-18 MED ORDER — PREDNISONE 10 MG PO TABS: ORAL_TABLET | ORAL | 0 refills | Status: DC

## 2022-05-18 MED ORDER — MOLNUPIRAVIR EUA 200MG CAPSULE: 4.0000 | ORAL_CAPSULE | Freq: Two times a day (BID) | ORAL | 0 refills | Status: AC

## 2022-05-18 NOTE — Telephone Encounter (Signed)
PCCM:   Calling physician line over weekend.   I spoke with patient. Was exposed to covid and now has covid.   Meds ordered this encounter  Medications   molnupiravir EUA (LAGEVRIO) 200 mg CAPS capsule    Sig: Take 4 capsules (800 mg total) by mouth 2 (two) times daily for 5 days.    Dispense:  40 capsule    Refill:  0   predniSONE (DELTASONE) 10 MG tablet    Sig: Take 4 tabs by mouth once daily x4 days, then 3 tabs x4 days, 2 tabs x4 days, 1 tab x4 days and stop.    Dispense:  40 tablet    Refill:  0   Garner Nash, DO [facility] 05/18/2022 12:18 PM

## 2022-05-21 ENCOUNTER — Other Ambulatory Visit: Payer: Self-pay | Admitting: Emergency Medicine

## 2022-05-22 NOTE — Telephone Encounter (Signed)
Thank you :)

## 2022-05-27 ENCOUNTER — Other Ambulatory Visit: Payer: Self-pay | Admitting: Cardiology

## 2022-05-27 DIAGNOSIS — I4891 Unspecified atrial fibrillation: Secondary | ICD-10-CM

## 2022-05-31 DIAGNOSIS — I5033 Acute on chronic diastolic (congestive) heart failure: Secondary | ICD-10-CM | POA: Diagnosis not present

## 2022-05-31 DIAGNOSIS — I10 Essential (primary) hypertension: Secondary | ICD-10-CM | POA: Diagnosis not present

## 2022-05-31 DIAGNOSIS — K219 Gastro-esophageal reflux disease without esophagitis: Secondary | ICD-10-CM | POA: Diagnosis not present

## 2022-05-31 DIAGNOSIS — J449 Chronic obstructive pulmonary disease, unspecified: Secondary | ICD-10-CM | POA: Diagnosis not present

## 2022-05-31 DIAGNOSIS — E785 Hyperlipidemia, unspecified: Secondary | ICD-10-CM | POA: Diagnosis not present

## 2022-06-13 ENCOUNTER — Telehealth: Payer: Self-pay | Admitting: Cardiology

## 2022-06-13 ENCOUNTER — Ambulatory Visit: Payer: Self-pay

## 2022-06-13 NOTE — Telephone Encounter (Signed)
Left a message to call back.

## 2022-06-13 NOTE — Patient Outreach (Signed)
  Care Coordination   Follow Up Visit Note   06/13/2022 Name: GUENEVERE ROORDA MRN: [id number] DOB: 1945/07/02  TAMEIA RAFFERTY is a 77 y.o. year old female who sees Harlan Stains, MD for primary care. I spoke with  [name] by phone today.  What matters to the patients health and wellness today?  I have been experiencing AFIB    Goals Addressed             This Visit's Progress    to monitor and manage my COPD and FALLS       Care Coordination Interventions: Active listening / Reflection utilized  Emotional Support Provided Problem [place] strategies reviewed Advised patient to self assesses COPD action plan zone and make appointment with provider if in the yellow zone for 48 hours without improvement Discussed the importance of adequate rest and management of fatigue with COPD Advised patient of importance of notifying provider of falls Assessed for falls since last encounter Assessed patients knowledge of fall risk prevention secondary to previously provided education Mrs. Leth was exposed to COVID-19 around December 28th, 2024. She took molnupiravir '200mg'$  capsules twice daily for five days and prednisone '10mg'$  taper dose, called in for her. She reported that she has been experiencing Afib for about a week and sounded very short of breath during the phone call. As the conversation progressed, she sounded better but mentioned that Afib occurred off and on. I suggested that she should call her cardiologist to schedule an appointment for examination.   I contacted [facility] and spoke with [facility], who recorded the necessary information and will have the nurses contact Mrs. Cletus Gash later today. Selena scheduled an appointment for Mrs. Cletus Gash with Dr. Marlou Porch on January 30th, 2024, at 9:40 am. Both Mrs. Cletus Gash and I appreciated Selena's help.        SDOH assessments and interventions completed:  No     Care Coordination Interventions:  Yes, provided   Follow up plan:  Follow up call scheduled for 07/16/22 1130 am    Encounter Outcome:  Pt. Visit Completed    Lazaro Arms RN, BSN, [facility]   Phone: [phone]

## 2022-06-13 NOTE — Patient Instructions (Signed)
Visit Information  Thank you for taking time to visit with me today. Please don't hesitate to contact me if I can be of assistance to you.   Following are the goals we discussed today:   Goals Addressed             This Visit's Progress    to monitor and manage my COPD and FALLS       Care Coordination Interventions: Active listening / Reflection utilized  Emotional Support Provided Problem [place] strategies reviewed Advised patient to self assesses COPD action plan zone and make appointment with provider if in the yellow zone for 48 hours without improvement Discussed the importance of adequate rest and management of fatigue with COPD Advised patient of importance of notifying provider of falls Assessed for falls since last encounter Assessed patients knowledge of fall risk prevention secondary to previously provided education [name] Molina was exposed to COVID-19 around December 28th, 2024. She took molnupiravir '200mg'$  capsules twice daily for five days and prednisone '10mg'$  taper dose, called in for her. She reported that she has been experiencing Afib for about a week and sounded very short of breath during the phone call. As the conversation progressed, she sounded better but mentioned that Afib occurred off and on. I suggested that she should call her cardiologist to schedule an appointment for examination.   I contacted [facility] and spoke with [facility], who recorded the necessary information and will have the nurses contact [name] Molina later today. Selena scheduled an appointment for [name] Molina with Dr. Marlou Porch on January 30th, 2024, at 9:40 am. Both [name] Molina and I appreciated Selena's help.        Our next appointment is by telephone on 07/16/22 at 1130 am  Please call the care guide team at [phone] if you need to cancel or reschedule your appointment.   If you are experiencing a Mental Health or [place] or need someone to talk to, please  call 1-800-273-TALK (toll free, 24 hour hotline)  Patient verbalizes understanding of instructions and care plan provided today and agrees to view in [place]. Active MyChart status and patient understanding of how to access instructions and care plan via MyChart confirmed with patient.     Lazaro Arms RN, BSN, [facility]   Phone: [phone]

## 2022-06-13 NOTE — Telephone Encounter (Signed)
Patient c/o Palpitations:  High priority if patient c/o lightheadedness, shortness of breath, or chest pain  How long have you had palpitations/irregular HR/ Afib? Are you having the symptoms now? A week, not now  Are you currently experiencing lightheadedness, SOB or CP? Not SOB now, but does get SOB when moving around, chest pain comes and goes, but not having chest pain now  Do you have a history of afib (atrial fibrillation) or irregular heart rhythm? yes  Have you checked your BP or HR? (document readings if available): HR 91, 150, 130, came down to 90  Are you experiencing any other symptoms? headaches  Patient states she has been having afib for about a week since she has recovered from afib. She says she gets SOB when going to the bathroom, but is not currently SOB. She also says she has had some sharp chest pain in the middle of her chest, but is not currently having any symptoms. She also says when she moves around her oxygen was 89, but now is 95.

## 2022-06-14 NOTE — Telephone Encounter (Signed)
Left message on voicemail to call back if she needs further assistance.

## 2022-06-18 ENCOUNTER — Ambulatory Visit: Payer: Medicare Other | Attending: Cardiology | Admitting: Cardiology

## 2022-06-18 ENCOUNTER — Encounter: Payer: Self-pay | Admitting: Cardiology

## 2022-06-18 VITALS — BP 126/60 | HR 97 | Ht 62.0 in | Wt 206.0 lb

## 2022-06-18 DIAGNOSIS — I480 Paroxysmal atrial fibrillation: Secondary | ICD-10-CM | POA: Insufficient documentation

## 2022-06-18 DIAGNOSIS — Z7901 Long term (current) use of anticoagulants: Secondary | ICD-10-CM | POA: Insufficient documentation

## 2022-06-18 DIAGNOSIS — I5032 Chronic diastolic (congestive) heart failure: Secondary | ICD-10-CM | POA: Diagnosis not present

## 2022-06-18 NOTE — Progress Notes (Signed)
Cardiology Office Note:    Date:  06/18/2022   ID:  [name], Molina Sep 01, 1945, MRN [id number]  PCP:  Harlan Stains, MD   [facility] Providers Cardiologist:  Candee Furbish, MD Electrophysiologist:  Will Meredith Leeds, MD     Referring MD: Harlan Stains, MD    History of Present Illness:    [name] Molina is a 77 y.o. female here for follow-up of atrial fibrillation.  Previously seen Dr. Curt Bears.  Has COPD diastolic heart failure anxiety.  Back in June 2022 was hospitalized with rapid atrial fibrillation.  She had been on Eliquis.  Flecainide was started during this hospitalization.  Comes in today with complaints of occasional tachycardia.  At 1 point on her meter she was running 150 bpm.  Sometimes she will go from 1 20-1 40.  This is likely breakthrough atrial fibrillation or flutter.  Today's EKG as below shows heart rate of 96 bpm but there may be an underlying flutter pattern.  Challenging to tell  For the most part she is feeling better today.  Quite deconditioned.  Oxygen.  Had [place] over Christmas.   Past Medical History:  Diagnosis Date   Anxiety    Aortic atherosclerosis (HCC)    Arthritis    Asthma    Atrial tachycardia    Cancer (HCC)    basal skin cancer on nose   Cataract    Chronic diastolic CHF (congestive heart failure) (HCC)    Complication of anesthesia    during cervical surgery she woke up during the procedure   COPD (chronic obstructive pulmonary disease) (HCC)    Depression    Diverticulosis    Family history of adverse reaction to anesthesia    mother had post op N&V   GERD (gastroesophageal reflux disease)    History of hiatal hernia    Hyperlipidemia    Hypertension    Insomnia    Macular degeneration    Migraines    Neuropathy    Obesity    PAF (paroxysmal atrial fibrillation) (HCC)    Pneumonia    Sleep apnea    history of it,not using a cpap, study 2018: didn't need cpap   Thyroid nodule 2000   radioactive caspule    Past  Surgical History:  Procedure Laterality Date   ANTERIOR CERVICAL DECOMP/DISCECTOMY FUSION N/A 06/15/2020   Procedure: ANTERIOR CERVICAL DECOMPRESSION FUSION CERVICAL FIVE-CERVICAL SIX WITH INSTRUMENTATION AND ALLOGRAFT;  Surgeon: Phylliss Bob, MD;  Location: [facility];  Service: Orthopedics;  Laterality: N/A;  anterior   CERVICAL POLYPECTOMY     INSIDE AND OUTSIDE   COLONOSCOPY     EYE SURGERY Bilateral    cataracts   POLYPECTOMY     REMOVED CARTLIAGE RIB      Current Medications: Current Meds  Medication Sig   acetaminophen (TYLENOL) 650 MG CR tablet Take 1,300 mg by mouth every 8 (eight) hours as needed for pain.   albuterol (VENTOLIN HFA) 108 (90 Base) MCG/ACT inhaler Inhale 2 puffs into the lungs every 6 (six) hours as needed for wheezing or shortness of breath.   ALLERGY RELIEF CETIRIZINE 10 MG tablet TAKE ONE TABLET BY MOUTH ONCE DAILY   BESIVANCE 0.6 % SUSP Place 1 drop into the left eye See admin instructions. Instill 1 drop into the left eye 4 times daily the day OF and day AFTER (eye injection)   Budeson-Glycopyrrol-Formoterol (BREZTRI AEROSPHERE) 160-9-4.8 MCG/ACT AERO Inhale 2 puffs into the lungs in the morning and at bedtime.  Cholecalciferol 50 MCG (2000 UT) CAPS Take by mouth.   Cyanocobalamin (VITAMIN B-12 PO) Take 2,500 mcg by mouth daily.   diltiazem (CARDIZEM CD) 240 MG 24 hr capsule TAKE ONE CAPSULE BY MOUTH EVERY MORNING   DULoxetine (CYMBALTA) 60 MG capsule Take 60 mg by mouth every morning.   ELIQUIS 5 MG TABS tablet TAKE ONE TABLET BY MOUTH EVERY MORNING and TAKE ONE TABLET BY MOUTH EVERYDAY AT BEDTIME   flecainide (TAMBOCOR) 50 MG tablet TAKE ONE TABLET BY MOUTH AT BREAKFAST AND AT BEDTIME   fluticasone (FLONASE) 50 MCG/ACT nasal spray Place 1 spray into both nostrils daily.   furosemide (LASIX) 40 MG tablet TAKE ONE TABLET BY MOUTH EVERY MORNING   ipratropium-albuterol (DUONEB) 0.5-2.5 (3) MG/3ML SOLN INHALE contents of ONE vial using nebulizer TWICE DAILY  (Patient taking differently: Take 3 mLs by nebulization every 4 (four) hours as needed (shortness of breath).)   LORazepam (ATIVAN) 0.5 MG tablet Take 1-2 tablets (0.5-1 mg total) by mouth daily as needed for anxiety. Anxiety   metoprolol succinate (TOPROL-XL) 50 MG 24 hr tablet TAKE ONE TABLET BY MOUTH EVERYDAY AT BEDTIME   montelukast (SINGULAIR) 10 MG tablet Take 10 mg by mouth at bedtime.   Multiple Vitamins-Minerals (PRESERVISION AREDS 2+MULTI VIT) CAPS Take 1 tablet by mouth 2 (two) times daily.   OXYGEN Inhale 2 L into the lungs continuous.   pantoprazole (PROTONIX) 40 MG tablet Take 1 tablet (40 mg total) by mouth daily.   pravastatin (PRAVACHOL) 40 MG tablet Take 40 mg by mouth at bedtime.    [DISCONTINUED] predniSONE (DELTASONE) 10 MG tablet Take 4 tabs by mouth once daily x4 days, then 3 tabs x4 days, 2 tabs x4 days, 1 tab x4 days and stop.     Allergies:   Food, Levaquin [levofloxacin in d5w], Tequin, Erythromycin, Oxycodone-acetaminophen, Codeine, Penicillins, Prednisone, and Wellbutrin [bupropion hcl]   Social History   Socioeconomic History   Marital status: Divorced    Spouse name: Not on file   Number of children: 3   Years of education: College   Highest education level: Not on file  Occupational History   Occupation: Emergency planning/management officer  Tobacco Use   Smoking status: Former    Packs/day: 1.00    Years: 20.00    Total pack years: 20.00    Types: Cigarettes    Quit date: 05/21/1991    Years since quitting: 31.0   Smokeless tobacco: Never  Vaping Use   Vaping Use: Never used  Substance and Sexual Activity   Alcohol use: No   Drug use: No   Sexual activity: Not on file  Other Topics Concern   Not on file  Social History Narrative   Patient is divorced with 3 children.   Patient is right handed.   Patient has a college education.   Patient drinks 1-2 servings daily.   Social Determinants of Health   Financial Resource Strain: Not on file  Food Insecurity:  No Food Insecurity (04/23/2021)   Hunger Vital Sign    Worried About Running Out of Food in the Last Year: Never true    Ran Out of Food in the Last Year: Never true  Transportation Needs: No Transportation Needs (04/23/2021)   PRAPARE - Hydrologist (Medical): No    Lack of Transportation (Non-Medical): No  Physical Activity: Not on file  Stress: Not on file  Social Connections: Not on file     Family History: The patient's family  history includes Asthma in her father; Breast cancer (age of onset: 25) in her maternal grandmother; Colon polyps (age of onset: 64) in her sister; Emphysema in her father; Heart disease in her father and paternal grandfather. There is no history of Colon cancer.  ROS:   Please see the history of present illness.     All other systems reviewed and are negative.  EKGs/Labs/Other Studies Reviewed:    The following studies were reviewed today:  Echocardiogram 11/16/2020:   1. Afib with RVR noted during the study.   2. Left ventricular ejection fraction, by estimation, is 65 to 70%. The  left ventricle has normal function. The left ventricle has no regional  wall motion abnormalities. There is moderate concentric left ventricular  hypertrophy. Left ventricular  diastolic function could not be evaluated.   3. Prominent RV epicardial fat. Right ventricular systolic function is  normal. The right ventricular size is normal. Tricuspid regurgitation  signal is inadequate for assessing PA pressure.   4. Left atrial size was mild to moderately dilated.   5. The mitral valve is degenerative. Trivial mitral valve regurgitation.  Mild mitral stenosis. The mean mitral valve gradient is 5.3 mmHg with  average heart rate of 106 bpm. Severe mitral annular calcification.   6. The aortic valve is grossly normal. Aortic valve regurgitation is not  visualized. No aortic stenosis is present.   7. The inferior vena cava is dilated in size with  >50% respiratory  variability, suggesting right atrial pressure of 8 mmHg.     EKG:  06/18/2022: Looks like she may have an underlying atrial flutter present best seen in the rhythm strip below and lead to or V5.  This is not conclusive however.  There is some baseline artifact.  Regardless it is regular at 96 bpm with old inferior infarct pattern. Sinus rhythm 73 previously reviewed.  03/13/21 showed sinus tachycardia 135.  QRS normal duration.  Recent Labs: 06/18/2021: Pro B Natriuretic peptide (BNP) 92.0 10/26/2021: BUN 17; Creatinine, Ser 0.76; Hemoglobin 13.1; Platelets 219; Potassium 4.1; Sodium 146  Recent Lipid Panel No results found for: "CHOL", "TRIG", "HDL", "CHOLHDL", "VLDL", "LDLCALC", "LDLDIRECT"   Risk Assessment/Calculations:              Physical Exam:    VS:  BP 126/60   Pulse 97   Ht '5\' 2"'$  (1.575 m)   Wt 206 lb (93.4 kg)   SpO2 97%   BMI 37.68 kg/m     Wt Readings from Last 3 Encounters:  06/18/22 206 lb (93.4 kg)  05/01/22 221 lb (100.2 kg)  04/25/22 221 lb (100.2 kg)     GEN:  Well nourished, well developed in no acute distress, O2 [place].  In wheelchair. HEENT: Normal NECK: No JVD; No carotid bruits LYMPHATICS: No lymphadenopathy CARDIAC: RRR, no murmurs, no rubs, gallops RESPIRATORY:  mild rhonchi at bases  ABDOMEN: Soft, non-tender, non-distended MUSCULOSKELETAL:  No edema; No deformity  SKIN: Warm and dry NEUROLOGIC:  Alert and oriented x 3 PSYCHIATRIC:  Normal affect   ASSESSMENT:    1. Paroxysmal atrial fibrillation (HCC)   2. Chronic diastolic CHF (congestive heart failure) ([place])   3. Chronic anticoagulation      PLAN:    In order of problems listed above:  Paroxysmal atrial fibrillation (HCC)/atrial flutter Flecainide 50 mg twice a day as well as diltiazem 240 mg daily and Toprol XL 50 mg at bedtime.  Dr. Curt Bears has seen.  High risk medication monitoring continued. Remaining in  sinus rhythm.  With fatigue she tried diltiazem in the  morning and metoprolol in the evening.  CHADSVASc 5.  Eliquis 5 mg twice a day.  Uses wheelchair.  Trouble getting up.  Sometimes when she is walking in the kitchen without her oxygen heart rate will increase.  This is normal.  No changes currently in medicine management.   Chronic anticoagulation Continue with Eliquis 5 mg twice a day.  No bleeding.  Monitor high risk medication with labs, EKG. overall hemoglobin 13.1 creatinine 0.76 stable.  No changes.   Chronic diastolic CHF (congestive heart failure) (HCC) Has severe mitral annular calcification EF 70% mean mitral valve gradient is 5 mmHg.  Stable.  No hospitalizations.  Continuing with Cardizem 240 Toprol 50.  Repeating echocardiogram.  Could consider SGLT2 inhibitor in the future.   COPD (chronic obstructive pulmonary disease) ([place]) Appreciate pulmonary assistance.  Tammy Parrott note reviewed.  Sees Dr. Lamonte Sakai as well.       Medication Adjustments/Labs and Tests Ordered: Current medicines are reviewed at length with the patient today.  Concerns regarding medicines are outlined above.  Orders Placed This Encounter  Procedures   EKG 12-Lead   ECHOCARDIOGRAM COMPLETE    No orders of the defined types were placed in this encounter.    Patient Instructions  Medication Instructions:  Your physician recommends that you continue on your current medications as directed. Please refer to the Current Medication list given to you today.  *If you need a refill on your cardiac medications before your next appointment, please call your pharmacy*  Lab Work: None  Testing/Procedures: Your physician has requested that you have an echocardiogram. Echocardiography is a painless test that uses sound waves to create images of your heart. It provides your doctor with information about the size and shape of your heart and how well your heart's chambers and valves are working. This procedure takes approximately one hour. There are no restrictions  for this procedure. Please do NOT wear cologne, perfume, aftershave, or lotions (deodorant is allowed). Please arrive 15 minutes prior to your appointment time.  Follow-Up: At [facility], you and your health needs are our priority.  As part of our continuing mission to provide you with exceptional heart care, we have created designated Provider Care Teams.  These Care Teams include your primary Cardiologist (physician) and Advanced Practice Providers (APPs -  Physician Assistants and Nurse Practitioners) who all work together to provide you with the care you need, when you need it.  Your next appointment:   6 month(s)  Provider:   Ambrose Pancoast, NP, Ermalinda Barrios, PA-C, Christen Bame, NP, or Richardson Dopp, PA-C    Signed, Candee Furbish, MD  06/18/2022 10:36 AM    [place]

## 2022-06-18 NOTE — Patient Instructions (Signed)
Medication Instructions:  Your physician recommends that you continue on your current medications as directed. Please refer to the Current Medication list given to you today.  *If you need a refill on your cardiac medications before your next appointment, please call your pharmacy*  Lab Work: None  Testing/Procedures: Your physician has requested that you have an echocardiogram. Echocardiography is a painless test that uses sound waves to create images of your heart. It provides your doctor with information about the size and shape of your heart and how well your heart's chambers and valves are working. This procedure takes approximately one hour. There are no restrictions for this procedure. Please do NOT wear cologne, perfume, aftershave, or lotions (deodorant is allowed). Please arrive 15 minutes prior to your appointment time.  Follow-Up: At [facility], you and your health needs are our priority.  As part of our continuing mission to provide you with exceptional heart care, we have created designated Provider Care Teams.  These Care Teams include your primary Cardiologist (physician) and Advanced Practice Providers (APPs -  Physician Assistants and Nurse Practitioners) who all work together to provide you with the care you need, when you need it.  Your next appointment:   6 month(s)  Provider:   Ambrose Pancoast, NP, Ermalinda Barrios, PA-C, Christen Bame, NP, or Richardson Dopp, PA-C

## 2022-06-19 NOTE — Telephone Encounter (Signed)
Pt was seen in office by Dr Marlou Porch 06/18/22.  Please see that note for further documentation.

## 2022-06-24 DIAGNOSIS — I10 Essential (primary) hypertension: Secondary | ICD-10-CM | POA: Diagnosis not present

## 2022-06-24 DIAGNOSIS — J449 Chronic obstructive pulmonary disease, unspecified: Secondary | ICD-10-CM | POA: Diagnosis not present

## 2022-06-24 DIAGNOSIS — E785 Hyperlipidemia, unspecified: Secondary | ICD-10-CM | POA: Diagnosis not present

## 2022-06-24 DIAGNOSIS — K219 Gastro-esophageal reflux disease without esophagitis: Secondary | ICD-10-CM | POA: Diagnosis not present

## 2022-07-05 ENCOUNTER — Ambulatory Visit (HOSPITAL_COMMUNITY): Payer: Medicare Other | Attending: Cardiology

## 2022-07-05 DIAGNOSIS — I480 Paroxysmal atrial fibrillation: Secondary | ICD-10-CM | POA: Insufficient documentation

## 2022-07-05 LAB — ECHOCARDIOGRAM COMPLETE
Area-P 1/2: 3.24 cm2
S' Lateral: 3.3 cm

## 2022-07-16 ENCOUNTER — Ambulatory Visit: Payer: Self-pay

## 2022-07-16 NOTE — Patient Outreach (Signed)
  Care Coordination   Follow Up Visit Note   07/16/2022 Name: [name] Molina MRN: [id number] DOB: Apr 06, 1946  [name] is a 77 y.o. year old female who sees Harlan Stains, MD for primary care. I spoke with  [name] by phone today.  What matters to the patients health and wellness today?  [name] breathing condition remains unchanged but doing well . She enjoyed using the [facility] but likes the [facility] better. Her oxygen saturation level this morning was 96, and her heart rate was 73.   During a recent visit to the doctor's office, it was suspected that she might have a small blood clotafter doing an ECG, so she was sent to have another elctrocardiogram  conducted. However, the good news is that her doctor advised her to keep taking her medications as usual it seems everything was ok.    Goals Addressed             This Visit's Progress    To monitor and manage my COPD and FALLS       Patient Goals/Self Care Activities: -Patient/Caregiver will self-administer medications as prescribed as evidenced by self-report/primary caregiver report  -Patient/Caregiver will attend all scheduled provider appointments as evidenced by clinician review of documented attendance to scheduled appointments and patient/caregiver report -Patient/Caregiver will call pharmacy for medication refills as evidenced by patient report and review of pharmacy fill history as appropriate -Patient/Caregiver will call provider office for new concerns or questions as evidenced by review of documented incoming telephone call notes and patient report -Patient/Caregiver verbalizes understanding of plan -Patient/Caregiver will focus on medication adherence by taking medications as prescribed  -Avoid smoke and air pollution -Keep your airway clear from mucus build up  -Control your cough by drinking plenty of water -Use a humidifier, if needed -Self assess COPD action plan zone and make appointment with provider  if you have been in the yellow zone for 48 hours without improvement. -Utilize infection prevention strategies to reduce risk of respiratory infection           SDOH assessments and interventions completed:  No     Care Coordination Interventions:  Yes, provided   Interventions Today    Flowsheet Row Most Recent Value  Chronic Disease   Chronic disease during today's visit Chronic Obstructive Pulmonary Disease (COPD)  General Interventions   General Interventions Discussed/Reviewed General Interventions Reviewed, Doctor Visits  Doctor Visits Discussed/Reviewed Doctor Visits Discussed        Follow up plan: Follow up call scheduled for 08/15/22 1130    Encounter Outcome:  Pt. Visit Completed   Lazaro Arms RN, BSN, [facility]   Phone: [phone]

## 2022-07-16 NOTE — Patient Instructions (Signed)
Visit Information  Thank you for taking time to visit with me today. Please don't hesitate to contact me if I can be of assistance to you.   Following are the goals we discussed today:   Goals Addressed             This Visit's Progress    To monitor and manage my COPD and FALLS       Patient Goals/Self Care Activities: -Patient/Caregiver will self-administer medications as prescribed as evidenced by self-report/primary caregiver report  -Patient/Caregiver will attend all scheduled provider appointments as evidenced by clinician review of documented attendance to scheduled appointments and patient/caregiver report -Patient/Caregiver will call pharmacy for medication refills as evidenced by patient report and review of pharmacy fill history as appropriate -Patient/Caregiver will call provider office for new concerns or questions as evidenced by review of documented incoming telephone call notes and patient report -Patient/Caregiver verbalizes understanding of plan -Patient/Caregiver will focus on medication adherence by taking medications as prescribed  -Avoid smoke and air pollution -Keep your airway clear from mucus build up  -Control your cough by drinking plenty of water -Use a humidifier, if needed -Self assess COPD action plan zone and make appointment with provider if you have been in the yellow zone for 48 hours without improvement. -Utilize infection prevention strategies to reduce risk of respiratory infection           Our next appointment is by telephone on 08/15/22 at 1130  Please call the care guide team at [phone] if you need to cancel or reschedule your appointment.   If you are experiencing a Mental Health or [place] or need someone to talk to, please call 1-800-273-TALK (toll free, 24 hour hotline)  Patient verbalizes understanding of instructions and care plan provided today and agrees to view in [place]. Active MyChart status and patient  understanding of how to access instructions and care plan via MyChart confirmed with patient.     Lazaro Arms RN, BSN, [facility]   Phone: [phone]

## 2022-08-13 ENCOUNTER — Encounter (INDEPENDENT_AMBULATORY_CARE_PROVIDER_SITE_OTHER): Payer: Medicare Other | Admitting: Ophthalmology

## 2022-08-13 DIAGNOSIS — H35033 Hypertensive retinopathy, bilateral: Secondary | ICD-10-CM | POA: Diagnosis not present

## 2022-08-13 DIAGNOSIS — H353112 Nonexudative age-related macular degeneration, right eye, intermediate dry stage: Secondary | ICD-10-CM | POA: Diagnosis not present

## 2022-08-13 DIAGNOSIS — H353221 Exudative age-related macular degeneration, left eye, with active choroidal neovascularization: Secondary | ICD-10-CM

## 2022-08-13 DIAGNOSIS — H43813 Vitreous degeneration, bilateral: Secondary | ICD-10-CM

## 2022-08-13 DIAGNOSIS — I10 Essential (primary) hypertension: Secondary | ICD-10-CM

## 2022-08-15 ENCOUNTER — Ambulatory Visit: Payer: Self-pay

## 2022-08-15 NOTE — Patient Instructions (Signed)
Visit Information  Thank you for taking time to visit with me today. Please don't hesitate to contact me if I can be of assistance to you.   Following are the goals we discussed today:   Goals Addressed             This Visit's Progress    To monitor and manage my COPD and FALLS       Patient Goals/Self Care Activities: -Patient/Caregiver will self-administer medications as prescribed as evidenced by self-report/primary caregiver report  -Patient/Caregiver will attend all scheduled provider appointments as evidenced by clinician review of documented attendance to scheduled appointments and patient/caregiver report -Patient/Caregiver will call pharmacy for medication refills as evidenced by patient report and review of pharmacy fill history as appropriate -Patient/Caregiver will call provider office for new concerns or questions as evidenced by review of documented incoming telephone call notes and patient report -Patient/Caregiver verbalizes understanding of plan -Patient/Caregiver will focus on medication adherence by taking medications as prescribed  -Keep your airway clear from mucus build up  --Use a humidifier, if needed -Self assess COPD action plan zone and make appointment with provider if you have been in the yellow zone for 48 hours without improvement. -Utilize infection prevention strategies to reduce risk of respiratory infection           Our next appointment is by telephone on 10/11/22 at 11 am  Please call the care guide team at [phone] if you need to cancel or reschedule your appointment.   If you are experiencing a Mental Health or [place] or need someone to talk to, please call 1-800-273-TALK (toll free, 24 hour hotline)  Patient verbalizes understanding of instructions and care plan provided today and agrees to view in [place]. Active MyChart status and patient understanding of how to access instructions and care plan via MyChart confirmed  with patient.     Lazaro Arms RN, BSN, [facility]   Phone: [phone]

## 2022-08-15 NOTE — Patient Outreach (Signed)
  Care Coordination   Follow Up Visit Note   08/15/2022 Name: [name] Molina MRN: [id number] DOB: 04/01/1946  [name] Molina is a 77 y.o. year old female who sees Harlan Stains, MD for primary care. I spoke with  [name] Molina by phone today.  What matters to the patients health and wellness today?  [name] Molina is doing fine. She is breathing well, has no problems with allergies, and is taking her medications.      Goals Addressed             This Visit's Progress    To monitor and manage my COPD and FALLS       Patient Goals/Self Care Activities: -Patient/Caregiver will self-administer medications as prescribed as evidenced by self-report/primary caregiver report  -Patient/Caregiver will attend all scheduled provider appointments as evidenced by clinician review of documented attendance to scheduled appointments and patient/caregiver report -Patient/Caregiver will call pharmacy for medication refills as evidenced by patient report and review of pharmacy fill history as appropriate -Patient/Caregiver will call provider office for new concerns or questions as evidenced by review of documented incoming telephone call notes and patient report -Patient/Caregiver verbalizes understanding of plan -Patient/Caregiver will focus on medication adherence by taking medications as prescribed  -Keep your airway clear from mucus build up  --Use a humidifier, if needed -Self assess COPD action plan zone and make appointment with provider if you have been in the yellow zone for 48 hours without improvement. -Utilize infection prevention strategies to reduce risk of respiratory infection           SDOH assessments and interventions completed:  No     Care Coordination Interventions:  Yes, provided  Interventions Today    Flowsheet Row Most Recent Value  Chronic Disease   Chronic disease during today's visit Chronic Obstructive Pulmonary Disease (COPD)  General Interventions   General  Interventions Discussed/Reviewed General Interventions Discussed  Pharmacy Interventions   Pharmacy Dicussed/Reviewed Pharmacy Topics Discussed       Follow up plan: Follow up call scheduled for 10/11/22  11 am    Encounter Outcome:  Pt. Visit Completed   Lazaro Arms RN, BSN, [facility]   Phone: [phone]

## 2022-10-03 ENCOUNTER — Telehealth: Payer: Self-pay | Admitting: Emergency Medicine

## 2022-10-03 NOTE — Telephone Encounter (Signed)
Called and spoke with the pt Grandson dx with strep and covid 10/03/22  She woke up today with some throat irritation  She is not having any other symptoms  Grandson lives with her   She has not taken a test yet  I advised her it can take several days after exp to become positive  She is asking if needs to go ahead and start antiviral  Last covid vaccine was Jan 2024 and then developed covid right after  Please advise, thanks!

## 2022-10-03 NOTE — Telephone Encounter (Signed)
I called and spoke with the pt and notified of response per Dr Byrum  She verbalized understanding  Nothing further needed 

## 2022-10-03 NOTE — Telephone Encounter (Signed)
I should not start her on antiviral without a positive test.  If she is having or develops congestion, drainage, then she should definitely be COVID tested.  If she continues to have sore throat then it may also be reasonable for her to be seen for strep testing.  I appreciate her call to notify us quickly.  She needs to keep [place] updated on how her symptoms do and on any testing that gets performed.

## 2022-10-03 NOTE — Telephone Encounter (Signed)
Pt calling in bc her grandson was diagnosed with strep and covid & with the problems that she has she doent know if she should or should not take the white tablet she was given before for Covid pls advise

## 2022-10-11 ENCOUNTER — Ambulatory Visit: Payer: Self-pay

## 2022-10-11 NOTE — Patient Instructions (Signed)
Visit Information  Thank you for taking time to visit with me today. Please don't hesitate to contact me if I can be of assistance to you.   Following are the goals we discussed today:   Goals Addressed             This Visit's Progress    To monitor and manage my COPD and FALLS       Patient Goals/Self Care Activities: -Patient/Caregiver will self-administer medications as prescribed as evidenced by self-report/primary caregiver report  -Patient/Caregiver will attend all scheduled provider appointments as evidenced by clinician review of documented attendance to scheduled appointments and patient/caregiver report -Patient/Caregiver will call pharmacy for medication refills as evidenced by patient report and review of pharmacy fill history as appropriate -Patient/Caregiver will call provider office for new concerns or questions as evidenced by review of documented incoming telephone call notes and patient report -Patient/Caregiver verbalizes understanding of plan -Patient/Caregiver will focus on medication adherence by taking medications as prescribed  -Keep your airway clear from mucus build up  --Use a humidifier, if needed -Self assess COPD action plan zone and make appointment with provider if you have been in the yellow zone for 48 hours without improvement. -Utilize infection prevention strategies to reduce risk of respiratory infection   -remember to be careful when around others who are sick         Our next appointment is by telephone on 12/11/22 at 11 am  Please call the care guide team at [phone] if you need to cancel or reschedule your appointment.   If you are experiencing a Mental Health or Behavioral Health Crisis or need someone to talk to, please call 1-800-273-TALK (toll free, 24 hour hotline)  Patient verbalizes understanding of instructions and care plan provided today and agrees to view in MyChart. Active MyChart status and patient understanding of how to  access instructions and care plan via MyChart confirmed with patient.     Juanell Fairly RN, BSN, [facility] Care Coordinator [facility]   Phone: [phone]

## 2022-10-11 NOTE — Patient Outreach (Signed)
  Care Coordination   Follow Up Visit Note   10/11/2022 Name: [name] Molina MRN: [id number] DOB: [birth year]  [name] Molina is a 77 y.o. year old female who sees Laurann Montana, MD for primary care. I spoke with  Franne Forts by phone today.  What matters to the patients health and wellness today?  Mrs. Siple is doing alright, but she has been feeling a little depressed because she has been staying inside the house. I suggested that she go outside and get some sun. She is worried about the pollen outside, so I advised her to wear a mask. She said she would get one and have someone go with her so she wouldn't fall. Last week, she had a sore throat, but it went away. She hasn't checked her oxygen saturation because she can't find her pulse oximeter. She had to change her tubing again because her cat chewed on it. She felt like she wasn't getting enough oxygen and was feeling weak and tired. However, she's feeling better now since the tubing has been changed.    Goals Addressed             This Visit's Progress    To monitor and manage my COPD and FALLS       Patient Goals/Self Care Activities: -Patient/Caregiver will self-administer medications as prescribed as evidenced by self-report/primary caregiver report  -Patient/Caregiver will attend all scheduled provider appointments as evidenced by clinician review of documented attendance to scheduled appointments and patient/caregiver report -Patient/Caregiver will call pharmacy for medication refills as evidenced by patient report and review of pharmacy fill history as appropriate -Patient/Caregiver will call provider office for new concerns or questions as evidenced by review of documented incoming telephone call notes and patient report -Patient/Caregiver verbalizes understanding of plan -Patient/Caregiver will focus on medication adherence by taking medications as prescribed  -Keep your airway clear from mucus build up  --Use a humidifier, if  needed -Self assess COPD action plan zone and make appointment with provider if you have been in the yellow zone for 48 hours without improvement. -Utilize infection prevention strategies to reduce risk of respiratory infection   -remember to be careful when around others who are sick         SDOH assessments and interventions completed:  No     Care Coordination Interventions:  Yes, provided   Interventions Today    Flowsheet Row Most Recent Value  Chronic Disease   Chronic disease during today's visit Chronic Obstructive Pulmonary Disease (COPD)  General Interventions   General Interventions Discussed/Reviewed General Interventions Discussed  Mental Health Interventions   Mental Health Discussed/Reviewed Mental Health Discussed  Safety Interventions   Safety Discussed/Reviewed Fall Risk, Safety Discussed        Follow up plan: Follow up call scheduled for 12/11/22  11 am    Encounter Outcome:  Pt. Visit Completed   Juanell Fairly RN, BSN, [facility] Care Coordinator [facility]   Phone: [phone]

## 2022-10-17 ENCOUNTER — Other Ambulatory Visit: Payer: Self-pay | Admitting: Cardiology

## 2022-10-17 ENCOUNTER — Other Ambulatory Visit: Payer: Self-pay | Admitting: Emergency Medicine

## 2022-10-18 DIAGNOSIS — J449 Chronic obstructive pulmonary disease, unspecified: Secondary | ICD-10-CM | POA: Diagnosis not present

## 2022-10-18 DIAGNOSIS — I10 Essential (primary) hypertension: Secondary | ICD-10-CM | POA: Diagnosis not present

## 2022-10-18 DIAGNOSIS — I480 Paroxysmal atrial fibrillation: Secondary | ICD-10-CM | POA: Diagnosis not present

## 2022-10-18 DIAGNOSIS — K219 Gastro-esophageal reflux disease without esophagitis: Secondary | ICD-10-CM | POA: Diagnosis not present

## 2022-10-18 DIAGNOSIS — E785 Hyperlipidemia, unspecified: Secondary | ICD-10-CM | POA: Diagnosis not present

## 2022-10-31 DIAGNOSIS — Z8659 Personal history of other mental and behavioral disorders: Secondary | ICD-10-CM | POA: Diagnosis not present

## 2022-10-31 DIAGNOSIS — Z820 Family history of epilepsy and other diseases of the nervous system: Secondary | ICD-10-CM | POA: Diagnosis not present

## 2022-10-31 DIAGNOSIS — Z818 Family history of other mental and behavioral disorders: Secondary | ICD-10-CM | POA: Diagnosis not present

## 2022-10-31 DIAGNOSIS — Z1379 Encounter for other screening for genetic and chromosomal anomalies: Secondary | ICD-10-CM | POA: Diagnosis not present

## 2022-11-14 ENCOUNTER — Other Ambulatory Visit: Payer: Self-pay | Admitting: Cardiology

## 2022-11-21 ENCOUNTER — Other Ambulatory Visit: Payer: Self-pay | Admitting: Cardiology

## 2022-11-22 NOTE — Telephone Encounter (Signed)
Prescription refill request for Eliquis received. Indication:afib Last office visit:1/24 Scr:0.7  7/23 Age: 77 Weight:93.4  kg  Prescription refilled

## 2022-11-25 DIAGNOSIS — I700 Atherosclerosis of aorta: Secondary | ICD-10-CM | POA: Diagnosis not present

## 2022-11-25 DIAGNOSIS — I10 Essential (primary) hypertension: Secondary | ICD-10-CM | POA: Diagnosis not present

## 2022-11-25 DIAGNOSIS — G4733 Obstructive sleep apnea (adult) (pediatric): Secondary | ICD-10-CM | POA: Diagnosis not present

## 2022-11-25 DIAGNOSIS — J9611 Chronic respiratory failure with hypoxia: Secondary | ICD-10-CM | POA: Diagnosis not present

## 2022-11-25 DIAGNOSIS — E538 Deficiency of other specified B group vitamins: Secondary | ICD-10-CM | POA: Diagnosis not present

## 2022-11-25 DIAGNOSIS — Z0000 Encounter for general adult medical examination without abnormal findings: Secondary | ICD-10-CM | POA: Diagnosis not present

## 2022-11-25 DIAGNOSIS — I5032 Chronic diastolic (congestive) heart failure: Secondary | ICD-10-CM | POA: Diagnosis not present

## 2022-11-25 DIAGNOSIS — M8588 Other specified disorders of bone density and structure, other site: Secondary | ICD-10-CM | POA: Diagnosis not present

## 2022-11-25 DIAGNOSIS — F419 Anxiety disorder, unspecified: Secondary | ICD-10-CM | POA: Diagnosis not present

## 2022-11-25 DIAGNOSIS — J449 Chronic obstructive pulmonary disease, unspecified: Secondary | ICD-10-CM | POA: Diagnosis not present

## 2022-11-25 DIAGNOSIS — F329 Major depressive disorder, single episode, unspecified: Secondary | ICD-10-CM | POA: Diagnosis not present

## 2022-11-25 DIAGNOSIS — E785 Hyperlipidemia, unspecified: Secondary | ICD-10-CM | POA: Diagnosis not present

## 2022-11-25 DIAGNOSIS — I480 Paroxysmal atrial fibrillation: Secondary | ICD-10-CM | POA: Diagnosis not present

## 2022-11-25 DIAGNOSIS — D6869 Other thrombophilia: Secondary | ICD-10-CM | POA: Diagnosis not present

## 2022-12-09 ENCOUNTER — Encounter: Payer: Self-pay | Admitting: Cardiology

## 2022-12-09 ENCOUNTER — Other Ambulatory Visit (HOSPITAL_BASED_OUTPATIENT_CLINIC_OR_DEPARTMENT_OTHER): Payer: Self-pay

## 2022-12-09 ENCOUNTER — Encounter: Payer: Self-pay | Admitting: Emergency Medicine

## 2022-12-09 NOTE — Telephone Encounter (Signed)
Please see the MyChart message reply(ies) for my assessment and plan.   To whom it may concern,   I am comfortable with Ms. Roddie Mc flying from [place] to [place] round trip from a cardiology standpoint. At our last visit 05/2022 she was stable and she currently expresses no new concerns.   Thank you,  Donato Schultz, MD  [facility]   This patient gave consent for this Medical Advice Message and is aware that it may result in a bill to their insurance company, as well as the possibility of receiving a bill for a co-payment or deductible. They are an established patient, but are not seeking medical advice exclusively about a problem treated during an in person or video visit in the last seven days. I did not recommend an in person or video visit within seven days of my reply.    I spent a total of 5 minutes cumulative time within 7 days through [employer].  Donato Schultz, MD

## 2022-12-11 ENCOUNTER — Ambulatory Visit: Payer: Self-pay

## 2022-12-11 NOTE — Patient Outreach (Signed)
  Care Coordination   Follow Up Visit Note   12/11/2022 Name: [name] Molina MRN: [id number] DOB: January 07, 1946  [name] Molina is a 77 y.o. year old female who sees [name] Montana, MD for primary care. I spoke with  [name] Molina by phone today.  What matters to the patients health and wellness today?  [name] current condition is stable. Her respiratory function remains adequate, although, during inclement weather, she may require respiratory support slightly earlier than usual. Oxygen supplementation is maintained at a prescribed range of 2 to 3 liters, and her oxygen saturation has consistently measured at 97%.    Goals Addressed             This Visit's Progress    To monitor and manage my COPD and FALLS       Patient Goals/Self Care Activities: -Patient/Caregiver will self-administer medications as prescribed as evidenced by self-report/primary caregiver report  -Patient/Caregiver will attend all scheduled provider appointments as evidenced by clinician review of documented attendance to scheduled appointments and patient/caregiver report -Patient/Caregiver will call pharmacy for medication refills as evidenced by patient report and review of pharmacy fill history as appropriate -Patient/Caregiver will call provider office for new concerns or questions as evidenced by review of documented incoming telephone call notes and patient report -Patient/Caregiver verbalizes understanding of plan -Patient/Caregiver will focus on medication adherence by taking medications as prescribed  -Keep your airway clear from mucus build up  -Self assess COPD action plan zone and make appointment with provider if you have been in the yellow zone for 48 hours without improvement -continue to check your O2sat and oxygen hose from the cats          SDOH assessments and interventions completed:  No     Care Coordination Interventions:  Yes, provided   Interventions Today    Flowsheet Row Most  Recent Value  Chronic Disease   Chronic disease during today's visit Chronic Obstructive Pulmonary Disease (COPD)  General Interventions   General Interventions Discussed/Reviewed General Interventions Reviewed  Safety Interventions   Safety Discussed/Reviewed Safety Reviewed        Follow up plan: Follow up call scheduled for 02/11/23  130 pm    Encounter Outcome:  Pt. Visit Completed   Juanell Fairly RN, BSN, [facility] Care Coordinator [facility]   Phone: [phone]

## 2022-12-11 NOTE — Patient Instructions (Signed)
Visit Information  Thank you for taking time to visit with me today. Please don't hesitate to contact me if I can be of assistance to you.   Following are the goals we discussed today:   Goals Addressed             This Visit's Progress    To monitor and manage my COPD and FALLS       Patient Goals/Self Care Activities: -Patient/Caregiver will self-administer medications as prescribed as evidenced by self-report/primary caregiver report  -Patient/Caregiver will attend all scheduled provider appointments as evidenced by clinician review of documented attendance to scheduled appointments and patient/caregiver report -Patient/Caregiver will call pharmacy for medication refills as evidenced by patient report and review of pharmacy fill history as appropriate -Patient/Caregiver will call provider office for new concerns or questions as evidenced by review of documented incoming telephone call notes and patient report -Patient/Caregiver verbalizes understanding of plan -Patient/Caregiver will focus on medication adherence by taking medications as prescribed  -Keep your airway clear from mucus build up  -Self assess COPD action plan zone and make appointment with provider if you have been in the yellow zone for 48 hours without improvement -continue to check your O2sat and oxygen hose from the cats          Our next appointment is by telephone on 02/11/23 at 130 pm  Please call the care guide team at [phone] if you need to cancel or reschedule your appointment.   If you are experiencing a Mental Health or Behavioral Health Crisis or need someone to talk to, please call 1-800-273-TALK (toll free, 24 hour hotline)  Patient verbalizes understanding of instructions and care plan provided today and agrees to view in MyChart. Active MyChart status and patient understanding of how to access instructions and care plan via MyChart confirmed with patient.     Juanell Fairly RN, BSN, [facility] Care  Coordinator [facility]   Phone: [phone]

## 2022-12-18 DIAGNOSIS — J449 Chronic obstructive pulmonary disease, unspecified: Secondary | ICD-10-CM | POA: Diagnosis not present

## 2022-12-18 DIAGNOSIS — K219 Gastro-esophageal reflux disease without esophagitis: Secondary | ICD-10-CM | POA: Diagnosis not present

## 2022-12-18 DIAGNOSIS — I10 Essential (primary) hypertension: Secondary | ICD-10-CM | POA: Diagnosis not present

## 2022-12-18 DIAGNOSIS — F330 Major depressive disorder, recurrent, mild: Secondary | ICD-10-CM | POA: Diagnosis not present

## 2022-12-18 DIAGNOSIS — I5032 Chronic diastolic (congestive) heart failure: Secondary | ICD-10-CM | POA: Diagnosis not present

## 2022-12-18 DIAGNOSIS — E785 Hyperlipidemia, unspecified: Secondary | ICD-10-CM | POA: Diagnosis not present

## 2022-12-18 DIAGNOSIS — I480 Paroxysmal atrial fibrillation: Secondary | ICD-10-CM | POA: Diagnosis not present

## 2022-12-18 DIAGNOSIS — F329 Major depressive disorder, single episode, unspecified: Secondary | ICD-10-CM | POA: Diagnosis not present

## 2023-01-09 ENCOUNTER — Telehealth: Payer: Self-pay | Admitting: *Deleted

## 2023-01-09 NOTE — Progress Notes (Signed)
  Care Coordination Note  01/09/2023 Name: [name] Molina MRN: [id number] DOB: February 13, 1946  [name] is a 77 y.o. year old female who is a primary care patient of Laurann Montana, MD and is actively engaged with the care management team. I reached out to Franne Forts by phone today to assist with re-scheduling a follow up visit with the RN Case Manager  Follow up plan: Unsuccessful telephone outreach attempt made. A HIPAA compliant phone message was left for the patient providing contact information and requesting a return call.   [facility]  Care Coordination Care Guide  Direct Dial: [phone]

## 2023-01-09 NOTE — Progress Notes (Signed)
  Care Coordination Note  01/09/2023 Name: [name] Molina MRN: [id number] DOB: 09-11-1945  [name] Molina is a 77 y.o. year old female who is a primary care patient of Laurann Montana, MD and is actively engaged with the care management team. I reached out to Franne Forts by phone today to assist with re-scheduling a follow up visit with the RN Case Manager  Follow up plan: Telephone appointment with care management team member scheduled for:02/12/23  [facility] Coordination Care Guide  Direct Dial: [phone]

## 2023-01-14 ENCOUNTER — Encounter (INDEPENDENT_AMBULATORY_CARE_PROVIDER_SITE_OTHER): Payer: Medicare Other | Admitting: Ophthalmology

## 2023-01-14 DIAGNOSIS — H353222 Exudative age-related macular degeneration, left eye, with inactive choroidal neovascularization: Secondary | ICD-10-CM | POA: Diagnosis not present

## 2023-01-14 DIAGNOSIS — H43813 Vitreous degeneration, bilateral: Secondary | ICD-10-CM | POA: Diagnosis not present

## 2023-01-14 DIAGNOSIS — H35033 Hypertensive retinopathy, bilateral: Secondary | ICD-10-CM | POA: Diagnosis not present

## 2023-01-14 DIAGNOSIS — I10 Essential (primary) hypertension: Secondary | ICD-10-CM | POA: Diagnosis not present

## 2023-01-14 DIAGNOSIS — H353112 Nonexudative age-related macular degeneration, right eye, intermediate dry stage: Secondary | ICD-10-CM

## 2023-01-18 DIAGNOSIS — F330 Major depressive disorder, recurrent, mild: Secondary | ICD-10-CM | POA: Diagnosis not present

## 2023-02-05 ENCOUNTER — Other Ambulatory Visit: Payer: Self-pay | Admitting: Emergency Medicine

## 2023-02-12 ENCOUNTER — Ambulatory Visit: Payer: Self-pay

## 2023-02-13 NOTE — Patient Instructions (Signed)
Visit Information  Thank you for taking time to visit with me today. Please don't hesitate to contact me if I can be of assistance to you.   Following are the goals we discussed today:   Goals Addressed             This Visit's Progress    To monitor and manage my COPD and FALLS       Patient Goals/Self Care Activities: -Patient/Caregiver will take medications as prescribed   -Patient/Caregiver will attend all scheduled provider appointments -Patient/Caregiver will call pharmacy for medication refills 3-7 days in advance of running out of medications -Patient/Caregiver will call provider office for new concerns or questions  -Patient/Caregiver will focus on medication adherence by taking medications as prescribed   -always check your lines to make sure they are free from holes -Keep your airway clear from mucus build up  -Self assess COPD action plan zone and make appointment with provider if you have been in the yellow zone for 48 hours without improvement -continue to check your O2sat and oxygen hose from the cats          Our next appointment is by telephone on 04/15/23 at 130 pm  Please call the care guide team at [phone] if you need to cancel or reschedule your appointment.   If you are experiencing a Mental Health or Behavioral Health Crisis or need someone to talk to, please call 1-800-273-TALK (toll free, 24 hour hotline)  Patient verbalizes understanding of instructions and care plan provided today and agrees to view in MyChart. Active MyChart status and patient understanding of how to access instructions and care plan via MyChart confirmed with patient.     Juanell Fairly RN, BSN, [facility] [employer] Phone: [phone]

## 2023-02-13 NOTE — Patient Outreach (Signed)
Care Coordination   Follow Up Visit Note   02/12/2023 Name: [name] Molina MRN: [id number] DOB: November 29, 1945  [name] Molina is a 77 y.o. year old female who sees Laurann Montana, MD for primary care. I spoke with  Franne Forts by phone today.  What matters to the patients health and wellness today?  Mrs. Handlin reported no respiratory issues. Her oxygen saturation level registered at 96%, and her heart rate was 67. She wants to move into an assisted living facility.    Goals Addressed             This Visit's Progress    To monitor and manage my COPD and FALLS       Patient Goals/Self Care Activities: -Patient/Caregiver will take medications as prescribed   -Patient/Caregiver will attend all scheduled provider appointments -Patient/Caregiver will call pharmacy for medication refills 3-7 days in advance of running out of medications -Patient/Caregiver will call provider office for new concerns or questions  -Patient/Caregiver will focus on medication adherence by taking medications as prescribed   -always check your lines to make sure they are free from holes -Keep your airway clear from mucus build up  -Self assess COPD action plan zone and make appointment with provider if you have been in the yellow zone for 48 hours without improvement -continue to check your O2sat and oxygen hose from the cats          SDOH assessments and interventions completed:  No     Care Coordination Interventions:  Yes, provided   Interventions Today    Flowsheet Row Most Recent Value  Chronic Disease   Chronic disease during today's visit Chronic Obstructive Pulmonary Disease (COPD)  General Interventions   General Interventions Discussed/Reviewed General Interventions Discussed, Durable Medical Equipment (DME)  Durable Medical Equipment (DME) Wheelchair  Wheelchair Motorized  [the patient would like to see about obtaining one. She is going to talk with her PCP about it.]  Pharmacy  Interventions   Pharmacy Dicussed/Reviewed Pharmacy Topics Discussed  Safety Interventions   Safety Discussed/Reviewed Safety Discussed        Follow up plan: Follow up call scheduled for 04/15/23  130 pm    Encounter Outcome:  Patient Visit Completed   Juanell Fairly RN, BSN, [facility]   Care Coordinator Phone: [phone]

## 2023-02-17 DIAGNOSIS — F330 Major depressive disorder, recurrent, mild: Secondary | ICD-10-CM | POA: Diagnosis not present

## 2023-02-28 DIAGNOSIS — J9611 Chronic respiratory failure with hypoxia: Secondary | ICD-10-CM | POA: Diagnosis not present

## 2023-02-28 DIAGNOSIS — M170 Bilateral primary osteoarthritis of knee: Secondary | ICD-10-CM | POA: Diagnosis not present

## 2023-02-28 DIAGNOSIS — G629 Polyneuropathy, unspecified: Secondary | ICD-10-CM | POA: Diagnosis not present

## 2023-02-28 DIAGNOSIS — M5136 Other intervertebral disc degeneration, lumbar region with discogenic back pain only: Secondary | ICD-10-CM | POA: Diagnosis not present

## 2023-02-28 DIAGNOSIS — I5032 Chronic diastolic (congestive) heart failure: Secondary | ICD-10-CM | POA: Diagnosis not present

## 2023-02-28 DIAGNOSIS — J449 Chronic obstructive pulmonary disease, unspecified: Secondary | ICD-10-CM | POA: Diagnosis not present

## 2023-03-05 DIAGNOSIS — Z23 Encounter for immunization: Secondary | ICD-10-CM | POA: Diagnosis not present

## 2023-03-13 ENCOUNTER — Telehealth: Payer: Self-pay | Admitting: Diagnostic Neuroimaging

## 2023-03-13 NOTE — Telephone Encounter (Signed)
LVM and sent MyChart msg informing pt of r/s needed for 11/25 appt- MD out.

## 2023-03-20 DIAGNOSIS — F330 Major depressive disorder, recurrent, mild: Secondary | ICD-10-CM | POA: Diagnosis not present

## 2023-04-04 ENCOUNTER — Other Ambulatory Visit: Payer: Self-pay | Admitting: Cardiology

## 2023-04-04 DIAGNOSIS — I4891 Unspecified atrial fibrillation: Secondary | ICD-10-CM

## 2023-04-07 ENCOUNTER — Ambulatory Visit: Payer: Medicare Other | Attending: Cardiology | Admitting: Cardiology

## 2023-04-07 ENCOUNTER — Encounter: Payer: Self-pay | Admitting: Cardiology

## 2023-04-07 VITALS — BP 128/62 | HR 76 | Ht 62.5 in | Wt 223.0 lb

## 2023-04-07 DIAGNOSIS — I480 Paroxysmal atrial fibrillation: Secondary | ICD-10-CM | POA: Diagnosis not present

## 2023-04-07 DIAGNOSIS — I10 Essential (primary) hypertension: Secondary | ICD-10-CM | POA: Diagnosis not present

## 2023-04-07 DIAGNOSIS — Z7901 Long term (current) use of anticoagulants: Secondary | ICD-10-CM | POA: Insufficient documentation

## 2023-04-07 NOTE — Patient Instructions (Signed)

## 2023-04-07 NOTE — Progress Notes (Signed)
Cardiology Office Note:  .   Date:  04/07/2023  ID:  [name] Molina, DOB [birth year], MRN [id number] PCP: Laurann Montana, MD  [facility] Providers Cardiologist:  Donato Schultz, MD Electrophysiologist:  Will Jorja Loa, MD     History of Present Illness: .   [name] Molina is a 77 y.o. female Discussed the use of AI scribe  History of Present Illness   The patient, a 77 year old with a history of paroxysmal atrial fibrillation, severe mitral annular calcification, and COPD, presents for a follow-up visit. She is currently on flecainide 50mg  twice daily, diltiazem 240mg  daily, Toprol XL 50mg  at bedtime, and Eliquis 5mg  twice daily for chronic anticoagulation. The patient reports feeling well overall, with her COPD remaining fairly stable. She notes that her breathing becomes more difficult when it is about to rain, attributing this to the 'mushy air.'  The patient recently traveled to [place], indicating that her atrial fibrillation has been well-controlled. She confirms adherence to her medication regimen, including Eliquis, which she receives prepackaged in the mail. She also takes Lasix, which she reports sometimes causes her to feel weak. She expresses dislike for this medication but acknowledges its role in managing her fluid balance. She notes that she did not take Lasix during her recent trip to [place], which she believes may have contributed to some increased swelling. Despite this, she reports that her heart sounds 'nice and smooth' today.         Studies Reviewed: .        Results DIAGNOSTIC Echocardiogram: Severe mitral annular calcification, EF 70%, mean mitral valve gradient 5 mmHg  Risk Assessment/Calculations:            Physical Exam:   VS:  BP 128/62   Pulse 76   Ht 5' 2.5" (1.588 m)   Wt 223 lb (101.2 kg)   SpO2 96%   BMI 40.14 kg/m    Wt Readings from Last 3 Encounters:  04/07/23 223 lb (101.2 kg)  06/18/22 206 lb (93.4 kg)  05/01/22 221 lb (100.2  kg)    GEN: Well nourished, well developed in no acute distress NECK: No JVD; No carotid bruits CARDIAC: RRR, no murmurs, no rubs, no gallops RESPIRATORY:  Clear to auscultation without rales, wheezing or rhonchi  ABDOMEN: Soft, non-tender, non-distended EXTREMITIES:  No edema; No deformity   ASSESSMENT AND PLAN: .    Assessment and Plan    Paroxysmal Atrial Fibrillation Paroxysmal atrial fibrillation, well-controlled with medication. Remains in sinus rhythm. Continues on flecainide 50 mg twice a day, diltiazem 240 mg daily, Toprol XL 50 mg at bedtime, and Eliquis 5 mg twice a day. No recent episodes reported. Discussed the importance of continuing flecainide to maintain normal rhythm and prevent recurrence. - Continue flecainide 50 mg twice a day - Continue diltiazem 240 mg daily - Continue Toprol XL 50 mg at bedtime - Continue Eliquis 5 mg twice a day - Follow up in one year  Severe Mitral Annular Calcification Severe mitral annular calcification noted on cardiogram with an ejection fraction of 70% and a mean mitral valve gradient of 5 mmHg. No changes in management at this time. - Continue current management  Chronic Obstructive Pulmonary Disease (COPD) COPD, well-managed with occasional exacerbations related to weather changes. No recent hospitalizations or significant exacerbations reported. - Continue current management with pulmonary medicine  General Health Maintenance On pravastatin 40 mg daily for cholesterol management. Reports occasional issues with Lasix causing weakness and increased urination. Discussed the necessity  of Lasix to manage fluid balance and prevent hospitalizations due to fluid overload. - Continue pravastatin 40 mg daily - Continue Lasix as prescribed - Monitor fluid balance and adjust Lasix as needed  Follow-up - Schedule follow-up appointment in one year.               Signed, Donato Schultz, MD

## 2023-04-14 ENCOUNTER — Ambulatory Visit: Payer: Medicare Other | Admitting: Diagnostic Neuroimaging

## 2023-04-15 ENCOUNTER — Ambulatory Visit: Payer: Self-pay

## 2023-04-15 NOTE — Patient Outreach (Signed)
  Care Coordination   Follow Up Visit Note   04/15/2023 Name: [name] Molina MRN: [id number] DOB: [birth year]  [name] Molina is a 77 y.o. year old female who sees [name] Montana, MD for primary care. I spoke with  [name] Molina by phone today.  What matters to [name] patients Molina and wellness today?  [name] Molina reported that she is currently in [name] regarding her Chronic Obstructive Pulmonary Disease (COPD). She indicated that her psychiatrist has released her, which enabled her to attend a wedding in [name] for a duration of two days. During this trip, she utilized her portable oxygen ( [employer]). Unfortunately, she experienced some swelling in her ankles and feet due to her decision to not her diuretic medication during those two days. Upon her return, she resumed her fluid pills, resulting in a reduction of [name] swelling in her extremities. Furthermore, [name] Molina stated that her oxygen saturation levels have consistently remained within [name] 97-98 percentage range. She is adhering to her prescribed medication regimen. At this stage, [name] Molina has decided to remain at home and has no plans for placement in a care facility.    Goals Addressed             This Visit's Progress    To monitor and manage my COPD and FALLS       Patient Goals/Self Care Activities: -Patient/Caregiver will take medications as prescribed   -Patient/Caregiver will attend all scheduled provider appointments -Patient/Caregiver will call pharmacy for medication refills 3-7 days in advance of running out of medications -Patient/Caregiver will call provider office for new concerns or questions  -Patient/Caregiver will focus on medication adherence by taking medications as prescribed   -always check your lines to make sure they are free from holes -Keep your airway clear from mucus build up  -Self assess COPD action plan Mol[place] and make appointment with provider if you have been in [name] for 48 hours  without improvement -continue to check your O2sat and oxygen hose from [name] cats -take your fluid pills         SDOH assessments and interventions completed:  No     Care Coordination Interventions:  Yes, provided   [name] Fairly RN, BSN, [name] Molina Johnson  [name] Molina, [name] Molina  Care Coordinator Phone: [phone]      Follow up plan: Follow up call scheduled for 05/20/23  2 pm    Encounter Outcome:  Patient Visit Completed   [name] Fairly RN, BSN, [name] Molina [name] Mol[place]  [name] Molina, [name] Molina Molina  Care Coordinator Phone: [phone]

## 2023-04-15 NOTE — Patient Instructions (Signed)
Visit Information  Thank you for taking time to visit with me today. Please don't hesitate to contact me if I can be of assistance to you.   Following are the goals we discussed today:   Goals Addressed             This Visit's Progress    To monitor and manage my COPD and FALLS       Patient Goals/Self Care Activities: -Patient/Caregiver will take medications as prescribed   -Patient/Caregiver will attend all scheduled provider appointments -Patient/Caregiver will call pharmacy for medication refills 3-7 days in advance of running out of medications -Patient/Caregiver will call provider office for new concerns or questions  -Patient/Caregiver will focus on medication adherence by taking medications as prescribed   -always check your lines to make sure they are free from holes -Keep your airway clear from mucus build up  -Self assess COPD action plan zone and make appointment with provider if you have been in the yellow zone for 48 hours without improvement -continue to check your O2sat and oxygen hose from the cats -take your fluid pills         Our next appointment is by telephone on 05/20/23 at 2 pm  Please call the care guide team at [phone] if you need to cancel or reschedule your appointment.   If you are experiencing a Mental Health or Behavioral Health Crisis or need someone to talk to, please call 1-800-273-TALK (toll free, 24 hour hotline)  Patient verbalizes understanding of instructions and care plan provided today and agrees to view in MyChart. Active MyChart status and patient understanding of how to access instructions and care plan via MyChart confirmed with patient.     Juanell Fairly RN, BSN, [facility] [place]  [facility], [facility]  Care Coordinator Phone: [phone]

## 2023-04-19 DIAGNOSIS — F330 Major depressive disorder, recurrent, mild: Secondary | ICD-10-CM | POA: Diagnosis not present

## 2023-05-06 ENCOUNTER — Other Ambulatory Visit: Payer: Self-pay | Admitting: Cardiology

## 2023-05-20 ENCOUNTER — Ambulatory Visit: Payer: Self-pay

## 2023-05-20 NOTE — Patient Instructions (Addendum)
 Visit Information  Thank you for taking time to visit with me today. Please don't hesitate to contact me if I can be of assistance to you.   Following are the goals we discussed today:   Goals Addressed             This Visit's Progress    To monitor and manage my COPD and FALLS       Patient Goals/Self Care Activities: -Patient/Caregiver will take medications as prescribed   -Patient/Caregiver will attend all scheduled provider appointments -Patient/Caregiver will call pharmacy for medication refills 3-7 days in advance of running out of medications -Patient/Caregiver will call provider office for new concerns or questions  -Patient/Caregiver will focus on medication adherence by taking medications as prescribed   -always check your lines to make sure they are free from holes -Keep your airway clear from mucus build up  -Self assess COPD action plan zone and make appointment with provider if you have been in the yellow zone for 48 hours without improvement -continue to check your O2sat and oxygen  hose from the cats -take your fluid pills -Wear your mask when going out          Our next appointment is by telephone on 06/25/23 at 130 pm  Please call the care guide team at [phone] if you need to cancel or reschedule your appointment.   If you are experiencing a Mental Health or Behavioral Health Crisis or need someone to talk to, please call 1-800-273-TALK (toll free, 24 hour hotline)  Patient verbalizes understanding of instructions and care plan provided today and agrees to view in MyChart. Active MyChart status and patient understanding of how to access instructions and care plan via MyChart confirmed with patient.     Wilbert Diver RN, BSN, [facility] [place]  [facility], [facility]  Care Coordinator Phone: [phone]

## 2023-05-20 NOTE — Patient Outreach (Addendum)
  Care Coordination   Follow Up Visit Note   05/20/2023 Name: ZURIAH BORDAS MRN: [id number] DOB: 03-15-1946  SHALAYNA ORNSTEIN is a 77 y.o. year old female who sees Teresa Channel, MD for primary care. I spoke with  [name] by phone today.  What matters to the patients health and wellness today?  I spoke with Mrs. Pokorney, who reported that she is in good health. She has received her vaccinations for COVID-19, RSV, and influenza and has not encountered any difficulties with her breathing. I advised her that, despite being vaccinated, it remains essential for her to wear a mask when in the presence of others, particularly when she is with her grandchildren or individuals exhibiting cold-like symptoms, due to her compromised immune system. She indicated that she possesses a sufficient supply of masks. Furthermore, Mrs. Kercheval informed me that she has been approved for an mining engineer wheelchair and is awaiting the necessary equipment to transport it for her outings and events. She has a scheduled appointment with her pulmonologist on January 9th.   Goals Addressed             This Visit's Progress    To monitor and manage my COPD and FALLS       Patient Goals/Self Care Activities: -Patient/Caregiver will take medications as prescribed   -Patient/Caregiver will attend all scheduled provider appointments -Patient/Caregiver will call pharmacy for medication refills 3-7 days in advance of running out of medications -Patient/Caregiver will call provider office for new concerns or questions  -Patient/Caregiver will focus on medication adherence by taking medications as prescribed   -always check your lines to make sure they are free from holes -Keep your airway clear from mucus build up  -Self assess COPD action plan zone and make appointment with provider if you have been in the yellow zone for 48 hours without improvement -continue to check your O2sat and oxygen  hose from the cats -take your fluid  pills -Wear your mask when going out          SDOH assessments and interventions completed:  No     Care Coordination Interventions:  Yes, provided   Interventions Today    Flowsheet Row Most Recent Value  Chronic Disease   Chronic disease during today's visit Chronic Obstructive Pulmonary Disease (COPD)  General Interventions   General Interventions Discussed/Reviewed General Interventions Discussed, Vaccines  Vaccines COVID-19, Flu, RSV  Education Interventions   Education Provided Provided Education  Pharmacy Interventions   Pharmacy Dicussed/Reviewed Pharmacy Topics Discussed  Safety Interventions   Safety Discussed/Reviewed Safety Discussed        Follow up plan: Follow up call scheduled for 06/25/23 130 pm    Encounter Outcome:  Patient Visit Completed   Wilbert Diver RN, BSN, [facility] [place]  [facility], [facility]  Care Coordinator Phone: [phone]

## 2023-05-22 ENCOUNTER — Encounter: Payer: Self-pay | Admitting: Cardiology

## 2023-05-22 DIAGNOSIS — I4891 Unspecified atrial fibrillation: Secondary | ICD-10-CM

## 2023-05-22 MED ORDER — METOPROLOL SUCCINATE ER 50 MG PO TB24: 50.0000 mg | ORAL_TABLET | Freq: Every day | ORAL | 3 refills | Status: DC

## 2023-05-22 NOTE — Telephone Encounter (Signed)
 Per OV note on 04/07/23 by Skains:  Paroxysmal Atrial Fibrillation Paroxysmal atrial fibrillation, well-controlled with medication. Remains in sinus rhythm. Continues on flecainide  50 mg twice a day, diltiazem  240 mg daily, Toprol  XL 50 mg at bedtime, and Eliquis  5 mg twice a day. No recent episodes reported. Discussed the importance of continuing flecainide  to maintain normal rhythm and prevent recurrence. - Continue flecainide  50 mg twice a day - Continue diltiazem  240 mg daily - Continue Toprol  XL 50 mg at bedtime - Continue Eliquis  5 mg twice a day - Follow up in one year  Medication sent to [facility] pharmacy per pt request.

## 2023-05-28 DIAGNOSIS — F329 Major depressive disorder, single episode, unspecified: Secondary | ICD-10-CM | POA: Diagnosis not present

## 2023-05-28 DIAGNOSIS — I5032 Chronic diastolic (congestive) heart failure: Secondary | ICD-10-CM | POA: Diagnosis not present

## 2023-05-28 DIAGNOSIS — J9611 Chronic respiratory failure with hypoxia: Secondary | ICD-10-CM | POA: Diagnosis not present

## 2023-05-28 DIAGNOSIS — E538 Deficiency of other specified B group vitamins: Secondary | ICD-10-CM | POA: Diagnosis not present

## 2023-05-28 DIAGNOSIS — F419 Anxiety disorder, unspecified: Secondary | ICD-10-CM | POA: Diagnosis not present

## 2023-05-28 DIAGNOSIS — J449 Chronic obstructive pulmonary disease, unspecified: Secondary | ICD-10-CM | POA: Diagnosis not present

## 2023-05-29 ENCOUNTER — Encounter: Payer: Self-pay | Admitting: Emergency Medicine

## 2023-05-29 ENCOUNTER — Telehealth: Payer: Self-pay | Admitting: *Deleted

## 2023-05-29 ENCOUNTER — Ambulatory Visit (INDEPENDENT_AMBULATORY_CARE_PROVIDER_SITE_OTHER): Payer: Medicare Other | Admitting: Emergency Medicine

## 2023-05-29 VITALS — BP 131/78 | HR 112 | Temp 98.0°F | Ht 62.0 in | Wt 223.0 lb

## 2023-05-29 DIAGNOSIS — J441 Chronic obstructive pulmonary disease with (acute) exacerbation: Secondary | ICD-10-CM

## 2023-05-29 DIAGNOSIS — K219 Gastro-esophageal reflux disease without esophagitis: Secondary | ICD-10-CM | POA: Diagnosis not present

## 2023-05-29 DIAGNOSIS — J438 Other emphysema: Secondary | ICD-10-CM | POA: Diagnosis not present

## 2023-05-29 DIAGNOSIS — J301 Allergic rhinitis due to pollen: Secondary | ICD-10-CM

## 2023-05-29 DIAGNOSIS — J9611 Chronic respiratory failure with hypoxia: Secondary | ICD-10-CM

## 2023-05-29 MED ORDER — IPRATROPIUM-ALBUTEROL 0.5-2.5 (3) MG/3ML IN SOLN
3.0000 mL | RESPIRATORY_TRACT | 3 refills | Status: DC | PRN
Start: 2023-05-29 — End: 2023-07-18

## 2023-05-29 NOTE — Progress Notes (Signed)
  Subjective:    Patient ID: [name] Molina, female    DOB: [birth year], 78 y.o.   MRN: [id number] HPI  ROV 05/01/22 --follow-up visit for 78 year old woman with a history of COPD and fixed asthma, chronic cough.  PMH significant for hypertension and A-fib with diastolic CHF, allergic rhinitis, GERD.  She has chronic hypoxemic respiratory failure and uses oxygen  at liters per minute. Currently managed on Breztri .  She uses DuoNeb very little - every few months. Uses albuterol  every few weeks. No flares since last time.  Flu shot up to date. She is planning to get COVID and RSV vaccines.  Zyrtec , Flonase  as needed, Singulair , Protonix   ROV 05/29/2023 --follow-up visit for 78 year old woman with a history of COPD/fixed asthma, chronic cough, hypertension and atrial fibrillation with associated diastolic dysfunction.  She has allergic rhinitis and GERD that contribute a cough.  Also chronic hypoxemic respiratory failure on supplemental oxygen  Currently managed on Breztri , has DuoNeb available to use if needed.  She is on Zyrtec , Flonase  as needed, Singulair , Protonix  Overall she has been doing ok - she has been dealing some w her depression due to illness of a friend. He O2 has been 2-4L/min. She likes the Breztri . She has had some mouth ulcers, but nothing like thrush. Rare albuterol  and DuoNeb use. She needs refill on the nebs. No pred, no flares. No real cough. Occasional wheeze.  Flu, COVID and RSV are all up to date.       Objective:   Physical Exam Vitals:   05/29/23 1137  BP: 131/78  Pulse: (!) 112  Temp: 98 F (36.7 C)  TempSrc: Oral  SpO2: 95%  Weight: 223 lb (101.2 kg)  Height: 5' 2 (1.575 m)    Gen: Pleasant, overweight woman, in no distress,  normal affect, wheelchair  ENT: No lesions,  mouth clear, focal rounded lesion on the tongue that is not ulcerated, no evidence of thrush  Neck: No JVD, no stridor  Lungs: No use of accessory muscles, distant, no wheeze  Cardiovascular:  RRR, heart sounds normal, no murmur or gallops  Musculoskeletal: no deformities  Neuro: alert, non focal  Skin: Warm and dry w some scaling, no lesions or rashes     Assessment & Plan:  COPD (chronic obstructive pulmonary disease) (HCC) Please continue your Breztri  2 puffs twice a day.  Rinse and gargle after using. Continue to keep your albuterol  available to use 2 puffs if needed for shortness of breath, chest tightness, wheezing. Keep your DuoNeb available to use up to every 6 hours if needed for shortness of breath.  We will refill this for you today Your flu, COVID, RSV vaccines are up-to-date Follow-up with APP in 6 months Follow Dr. Shelah in 12 months, sooner if you have any problems.  Allergic rhinitis Continue your Zyrtec  (cetirizine ) once daily Keep your fluticasone  nasal spray (Flonase ) available to use 2 sprays each nostril once daily if you need it for congestion Continue Singulair  (montelukast ) 10 mg each evening  GERD without esophagitis Continue Protonix  as you have been taking it  Chronic respiratory failure with hypoxia (HCC) Continue your oxygen  at 2-4 L/min depending on your level of exertion    Lamar Shelah, MD, PhD 05/29/2023, 12:02 PM [facility] [phone] or if no answer [phone]

## 2023-05-29 NOTE — Assessment & Plan Note (Signed)
 Please continue your Breztri  2 puffs twice a day.  Rinse and gargle after using. Continue to keep your albuterol  available to use 2 puffs if needed for shortness of breath, chest tightness, wheezing. Keep your DuoNeb available to use up to every 6 hours if needed for shortness of breath.  We will refill this for you today Your flu, COVID, RSV vaccines are up-to-date Follow-up with APP in 6 months Follow Dr. Shelah in 12 months, sooner if you have any problems.

## 2023-05-29 NOTE — Patient Instructions (Addendum)
 Please continue your Breztri  2 puffs twice a day.  Rinse and gargle after using. Continue to keep your albuterol  available to use 2 puffs if needed for shortness of breath, chest tightness, wheezing. Keep your DuoNeb available to use up to every 6 hours if needed for shortness of breath.  We will refill this for you today Continue your Zyrtec  (cetirizine ) once daily Keep your fluticasone  nasal spray (Flonase ) available to use 2 sprays each nostril once daily if you need it for congestion Continue Singulair  (montelukast ) 10 mg each evening Continue Protonix  as you have been taking it Continue your oxygen  at 2-4 L/min depending on your level of exertion Your flu, COVID, RSV vaccines are up-to-date Follow-up with APP in 6 months Follow Dr. Shelah in 12 months, sooner if you have any problems.

## 2023-05-29 NOTE — Assessment & Plan Note (Signed)
 Continue your oxygen at 2-4 L/min depending on your level of exertion

## 2023-05-29 NOTE — Assessment & Plan Note (Signed)
Continue Protonix as you have been taking it 

## 2023-05-29 NOTE — Assessment & Plan Note (Signed)
 Continue your Zyrtec (cetirizine) once daily Keep your fluticasone nasal spray (Flonase) available to use 2 sprays each nostril once daily if you need it for congestion Continue Singulair (montelukast) 10 mg each evening

## 2023-05-29 NOTE — Progress Notes (Signed)
 Complex Care Management Care Guide Note  05/29/2023 Name: [name] Molina MRN: [id number] DOB: [birth year]  [name] Molina is a 78 y.o. year old female who is a primary care patient of Teresa Channel, MD and is actively engaged with the care management team. I reached out to [name] by phone today to assist with scheduling  with the Licensed Clinical Social Worker.  Follow up plan: Unsuccessful telephone outreach attempt made. A HIPAA compliant phone message was left for the patient providing contact information and requesting a return call.  [facility]  Care Coordination Care Guide  Direct Dial: [phone]

## 2023-06-02 ENCOUNTER — Telehealth: Payer: Self-pay | Admitting: *Deleted

## 2023-06-02 ENCOUNTER — Other Ambulatory Visit: Payer: Self-pay | Admitting: *Deleted

## 2023-06-02 MED ORDER — BREZTRI AEROSPHERE 160-9-4.8 MCG/ACT IN AERO: 2.0000 | INHALATION_SPRAY | Freq: Two times a day (BID) | RESPIRATORY_TRACT | 3 refills | Status: AC

## 2023-06-02 NOTE — Telephone Encounter (Signed)
 Received fax from [facility] request new prescription for Breztri.  Script printed and placed on Dr. Kavin Leech desk in A pod for his signature.

## 2023-06-03 ENCOUNTER — Ambulatory Visit: Payer: Self-pay | Admitting: Licensed Clinical Social Worker

## 2023-06-06 ENCOUNTER — Other Ambulatory Visit: Payer: Self-pay | Admitting: Cardiology

## 2023-06-06 NOTE — Patient Instructions (Addendum)
 Visit Information  Thank you for taking time to visit with me today. Please don't hesitate to contact me if I can be of assistance to you before our next scheduled telephone appointment.  Following are the goals we discussed today:  Keep all upcoming appointments discussed today Continue with compliance of taking medication prescribed by Doctor Implement healthy coping skills discussed to assist with management of symptoms  Our next appointment is by telephone on 2/10 at 3 PM  Please call the care guide team at [phone] if you need to cancel or reschedule your appointment.   If you are experiencing a Mental Health or Behavioral Health Crisis or need someone to talk to, please call the Suicide and Crisis Lifeline: 988 call 911   Following is a copy of your full plan of care:  There are no care plans that you recently modified to display for this patient.   [name] Molina was given information about Care Management services by the embedded care coordination team including:  Care Management services include personalized support from designated clinical staff supervised by her physician, including individualized plan of care and coordination with other care providers 24/7 contact phone numbers for assistance for urgent and routine care needs. The patient may stop CCM services at any time (effective at the end of the month) by phone call to the office staff.  Patient agreed to services and verbal consent obtained.   Patient verbalizes understanding of instructions and care plan provided today and agrees to view in MyChart. Active MyChart status and patient understanding of how to access instructions and care plan via MyChart confirmed with patient.     [name] Molina [facility]  [facility], [facility] Clinical Social Worker Direct Dial: [phone]  Fax: [phone] Website: [email or web address] 8:27 AM

## 2023-06-06 NOTE — Patient Outreach (Signed)
  Care Coordination   Initial Visit Note   06/03/2023 Name: TAQUILA LEYS MRN: [id number] DOB: [birth year]  VALA RAFFO is a 78 y.o. year old female who sees Teresa Channel, MD for primary care. I spoke with  [name] by phone today.  What matters to the patients health and wellness today?  Symptom Management, Level of Care, Supportive Resources    Goals Addressed             This Visit's Progress    Obtain Supportive Resources-LCSW   On track    Activities and task to complete in order to accomplish goals.   Keep all upcoming appointments discussed today Continue with compliance of taking medication prescribed by Doctor Implement healthy coping skills discussed to assist with management of symptoms         SDOH assessments and interventions completed:  Yes  SDOH Interventions Today    Flowsheet Row Most Recent Value  SDOH Interventions   Food Insecurity Interventions Intervention Not Indicated  Housing Interventions Intervention Not Indicated  Transportation Interventions Intervention Not Indicated  Utilities Interventions Intervention Not Indicated        Care Coordination Interventions:  Yes, provided  Interventions Today    Flowsheet Row Most Recent Value  Chronic Disease   Chronic disease during today's visit Chronic Obstructive Pulmonary Disease (COPD), Congestive Heart Failure (CHF), Atrial Fibrillation (AFib), Hypertension (HTN), Other  [MDD and GAD]  General Interventions   General Interventions Discussed/Reviewed General Interventions Discussed, [employer], Doctor Visits, Level of Care  Doctor Visits Discussed/Reviewed Doctor Visits Discussed  Level of Care Assisted Living, Personal Care Services, Applications  [Pt receives support from family, who she resides with,  however, pt is often home alone until evening. Interested in aid to assist with bath 2 x weekly, currently pt's daughter will assist 2 x week]  Applications [place], [place]   [Discussed requirements to be eligible for ALF and benefits of applying for Medicaid to assist with costs/aid]  Education Interventions   Applications [place], [place]  [Discussed requirements to be eligible for ALF and benefits of applying for Medicaid to assist with costs/aid]  Mental Health Interventions   Mental Health Discussed/Reviewed Mental Health Discussed, Coping Strategies, Anxiety, Depression, Grief and Loss  [Recent loss of pt's best friend]  Nutrition Interventions   Nutrition Discussed/Reviewed Nutrition Discussed  Pharmacy Interventions   Pharmacy Dicussed/Reviewed Pharmacy Topics Discussed, Medication Adherence  [Pt identified barriers to being compliant with med regimen. Strategies to assist discussed]  Safety Interventions   Safety Discussed/Reviewed Safety Discussed       Follow up plan: Follow up call scheduled for 2-4 weeks    Encounter Outcome:  Patient Visit Completed   Rolin Kerns, LCSW   [facility], [facility] Clinical Social Worker Direct Dial: [phone]  Fax: [phone] Website: [email or web address] 8:26 AM

## 2023-06-09 NOTE — Progress Notes (Signed)
Jasmine LCSW called 06/03/23

## 2023-06-14 IMAGING — DX DG CHEST 2V
2 series · 2 of 2 positions shown · non-contrast
Comparison: 11/16/2020, CT 11/16/2020

CLINICAL DATA: 75-year-old female with cough

EXAM:
CHEST - 2 VIEW

[chest pa]
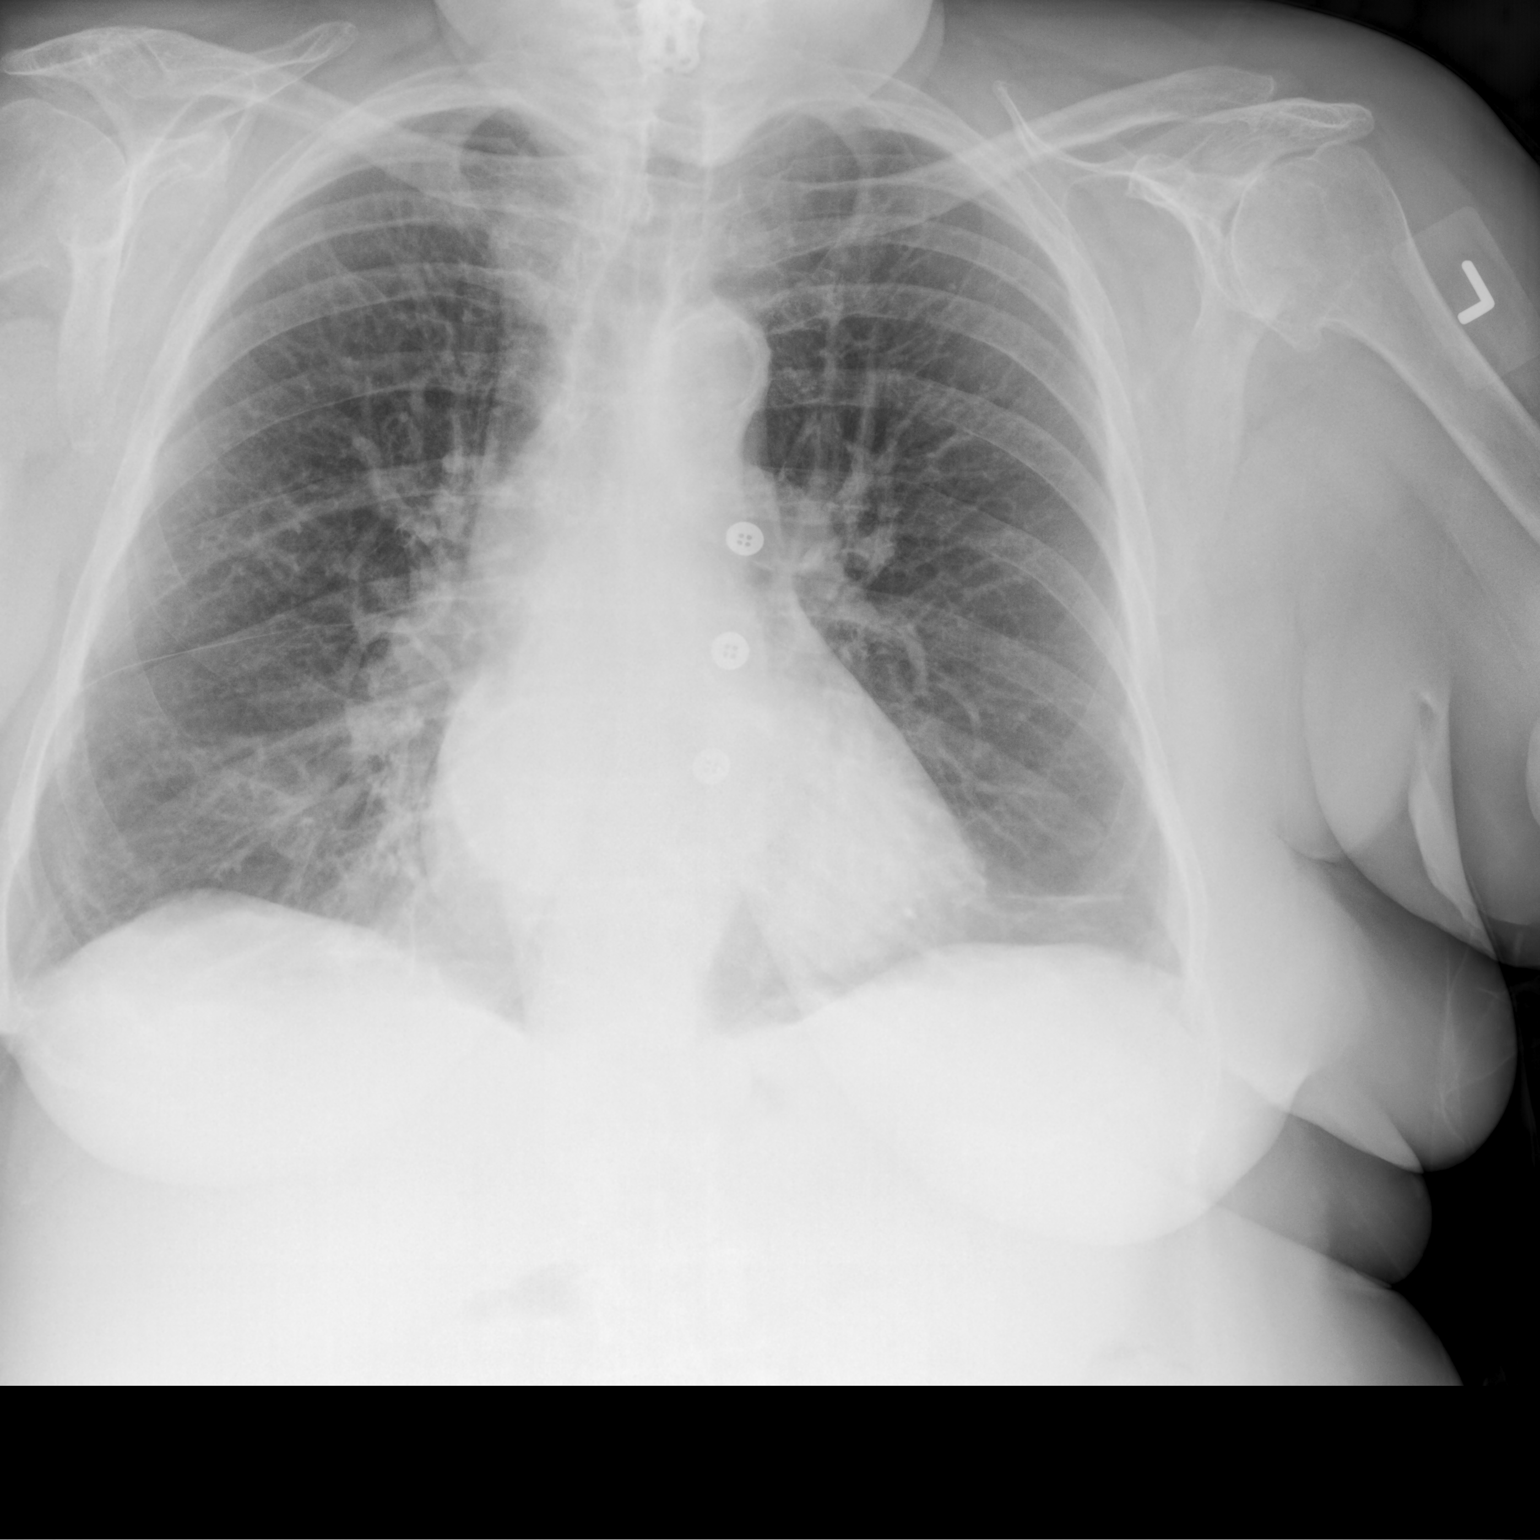

[chest lat]
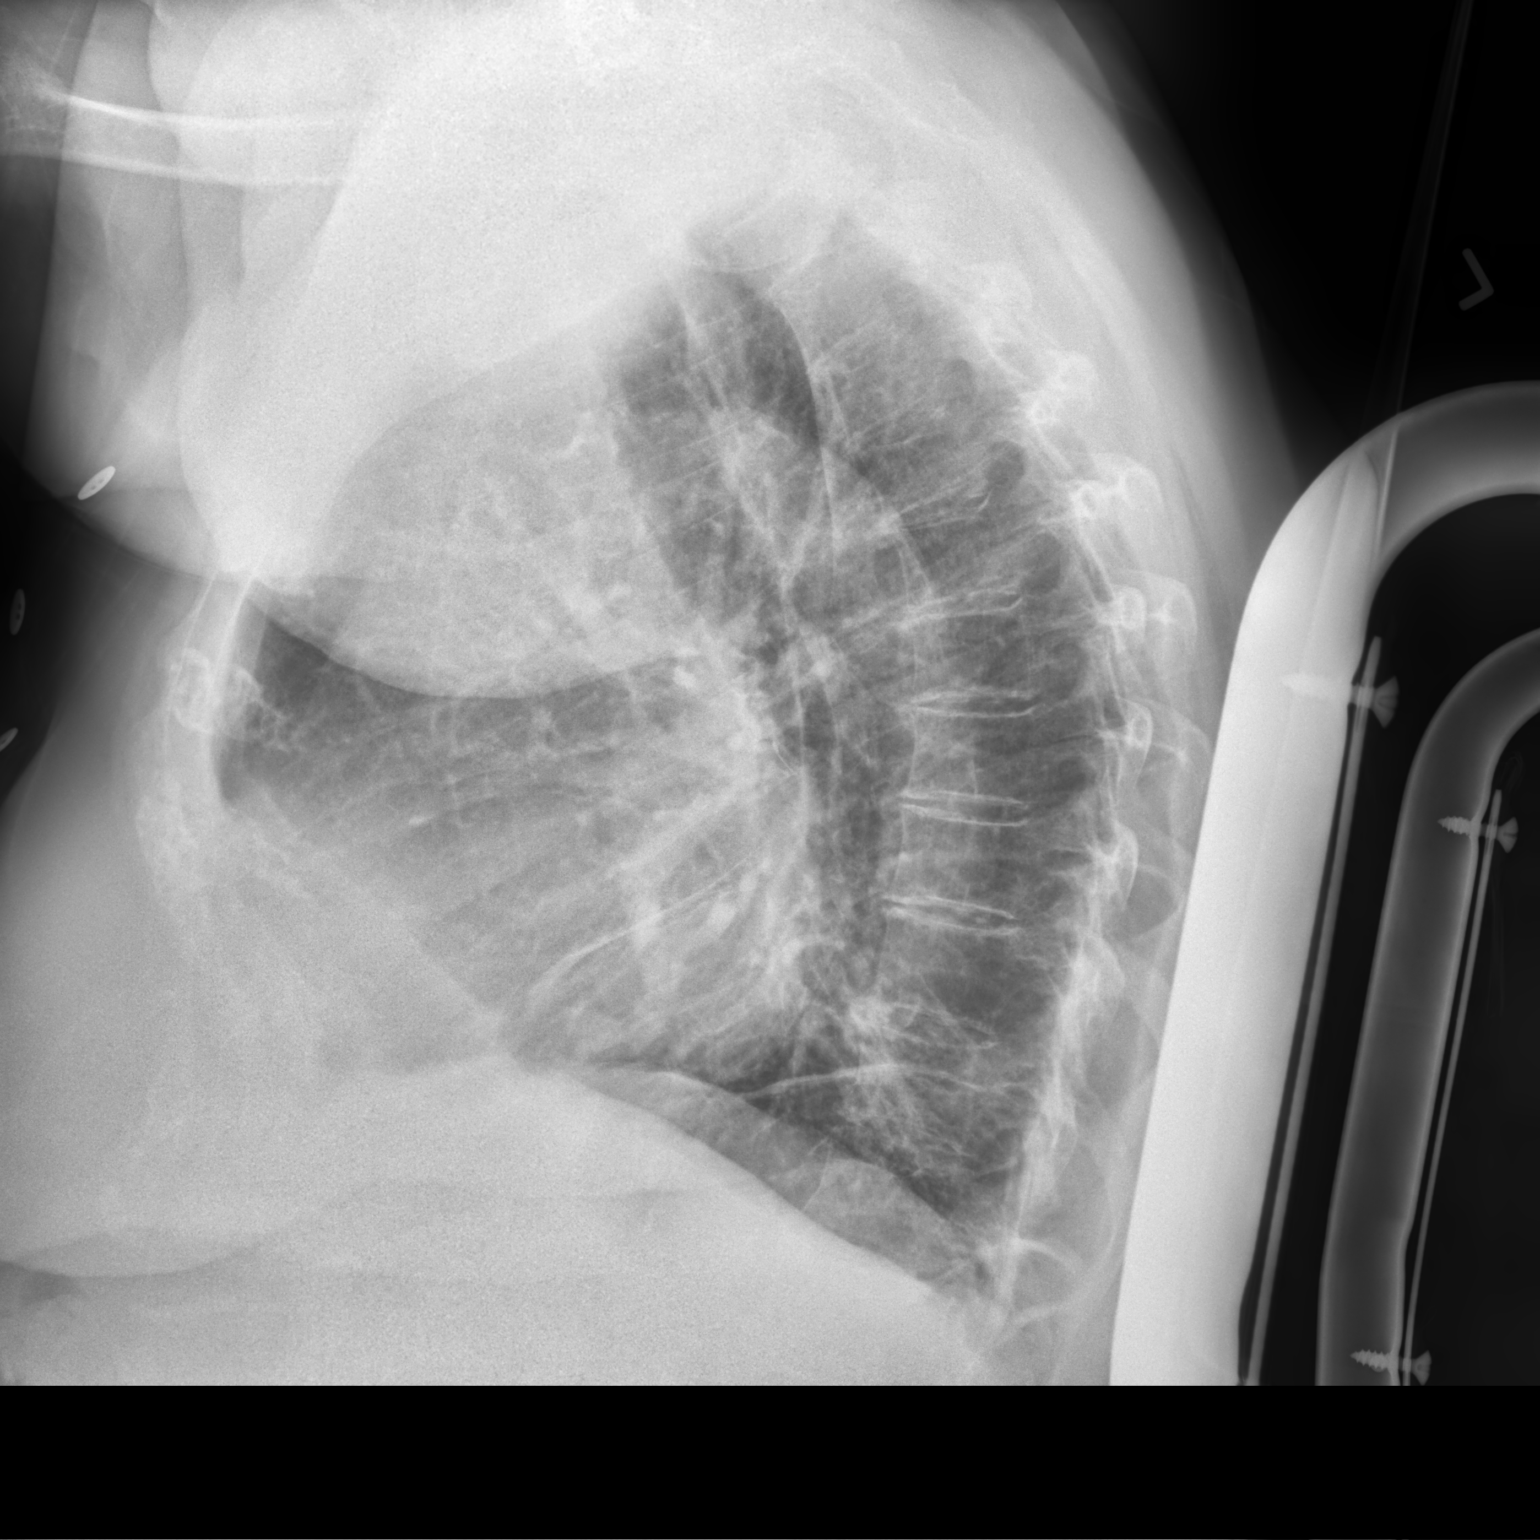

[2 of 2 positions shown; findings below may reference images not displayed]

FINDINGS: Cardiomediastinal silhouette unchanged in size and contour.

Stigmata of emphysema, with increased retrosternal airspace,
flattened hemidiaphragms, increased AP diameter, and hyperinflation
on the AP view.

Similar appearance of coarsened interstitial markings, with
increased conspicuity of reticulonodular opacities throughout. No
interlobular septal thickening. No pneumothorax. No pleural
effusion.

No airspace disease.

No displaced fracture. Degenerative changes of the spine. Surgical
changes of the cervical region incompletely imaged.
IMPRESSION: Mild reticulonodular opacities, may reflect atypical infection.
Background of emphysema/chronic changes

## 2023-06-16 IMAGING — CT CT HEAD W/O CM
3 series · 16 of 47 positions shown, 19 images · non-contrast
Comparison: None.

CLINICAL DATA: Head trauma, mod-severe

EXAM:
CT HEAD WITHOUT CONTRAST
TECHNIQUE: Contiguous axial images were obtained from the base of the skull
through the vertex without intravenous contrast.

[Series 4: head 5.0 h30s · axial · 0.40mm/px · z∈[-90,+55]mm · 10 of 35 slices shown, 13 images]
[im 3/35  brain]
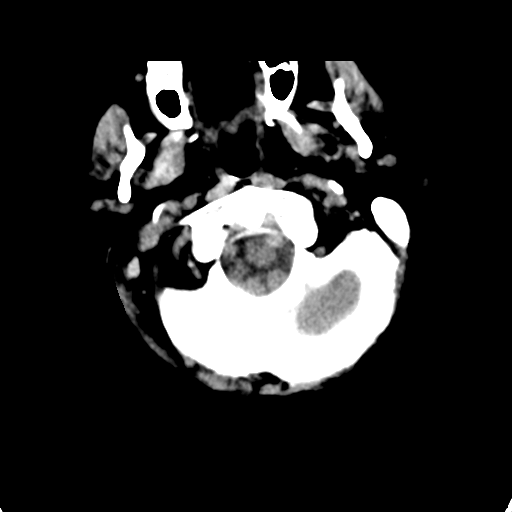
[im 3/35  bone]
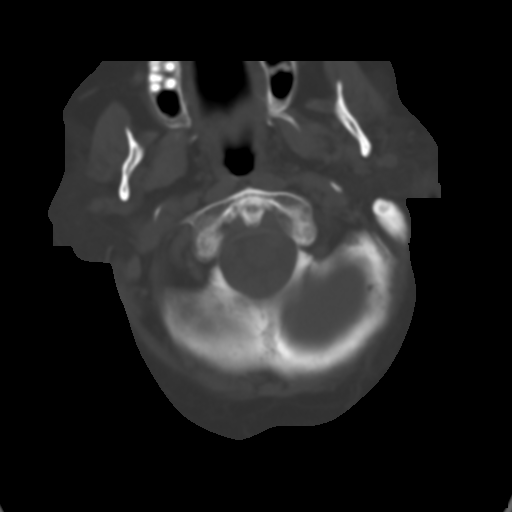
[im 6/35  brain]
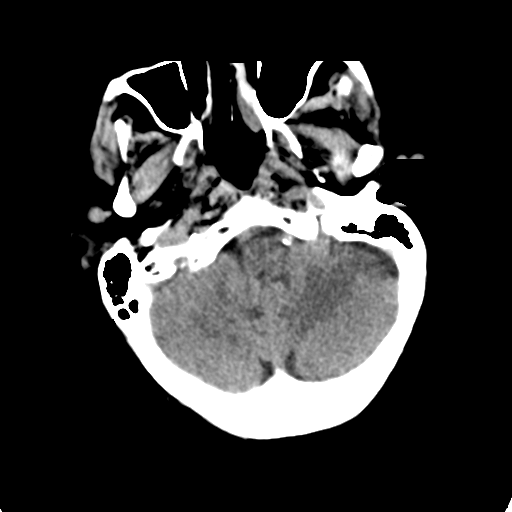
[im 10/35  brain]
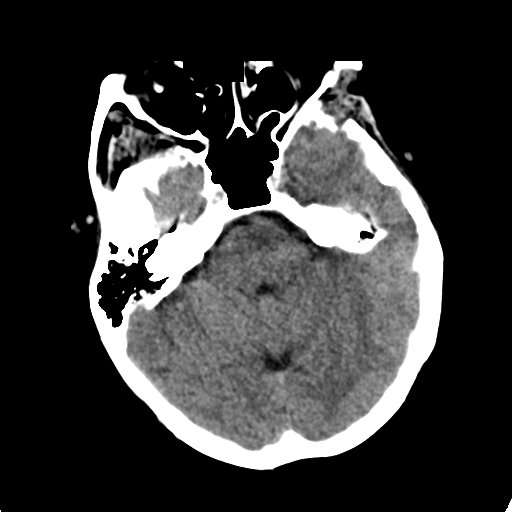
[im 12/35  brain]
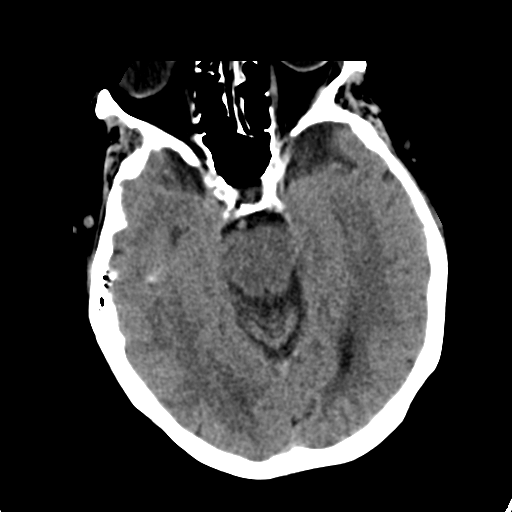
[im 16/35  brain]
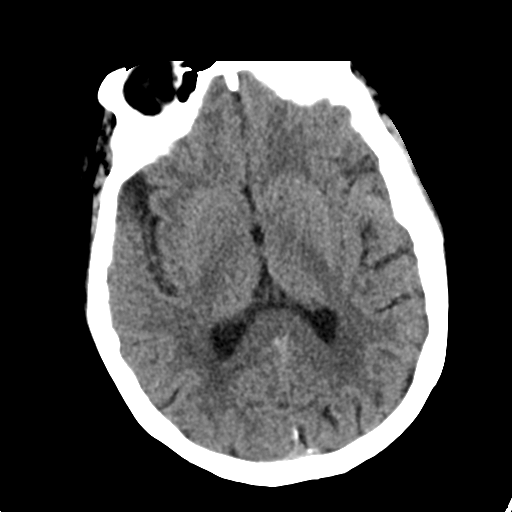
[im 16/35  bone]
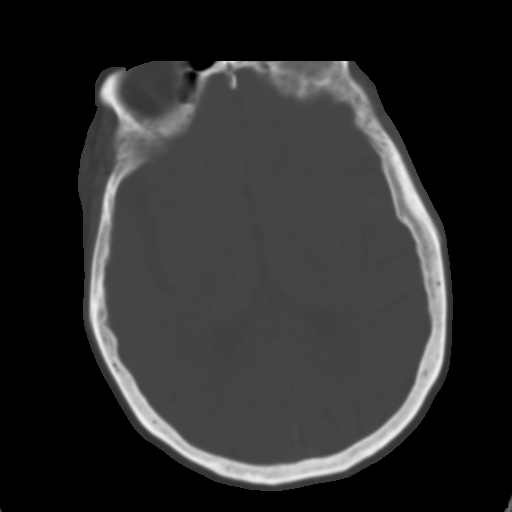
[im 19/35  brain]
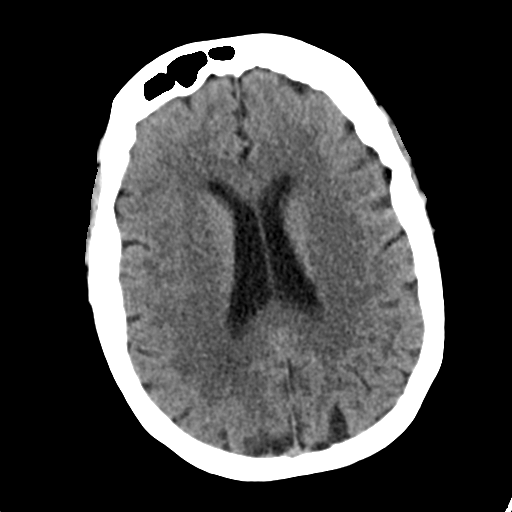
[im 23/35  brain]
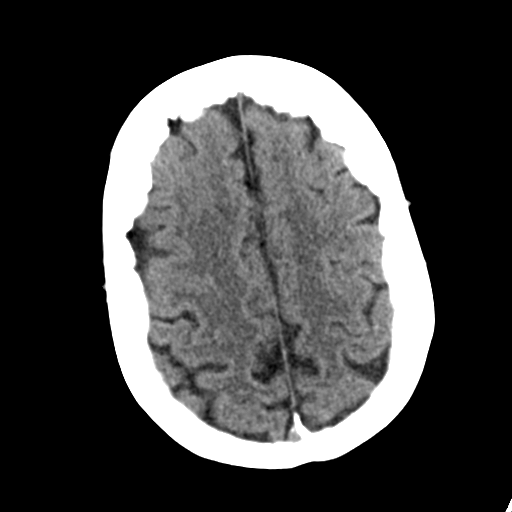
[im 26/35  brain]
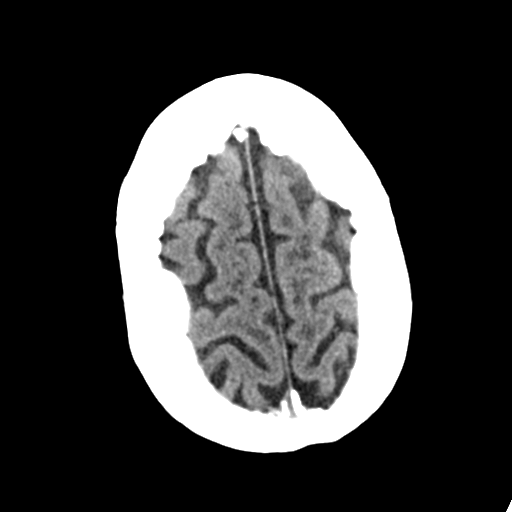
[im 29/35  brain]
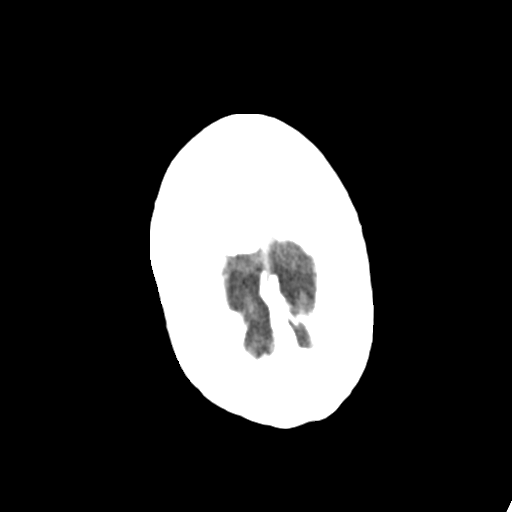
[im 29/35  bone]
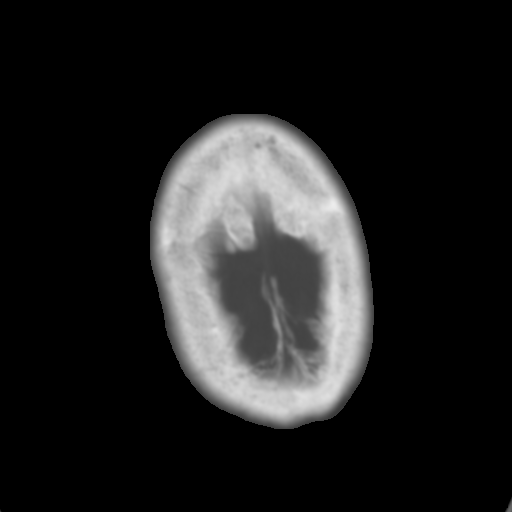
[im 32/35  brain]
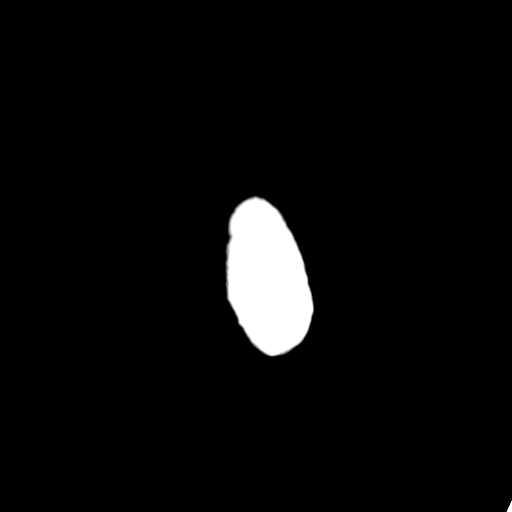

[Series 6: head 3.0 mpr cor · coronal · 0.32mm/px · 3 of 67 slices shown]
[im 23/67  brain]
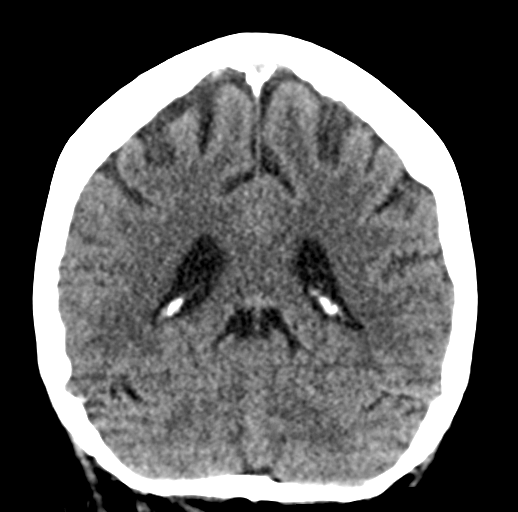
[im 30/67  brain]
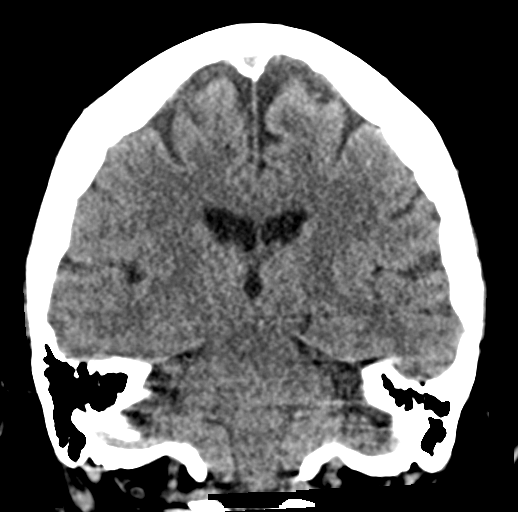
[im 37/67  brain]
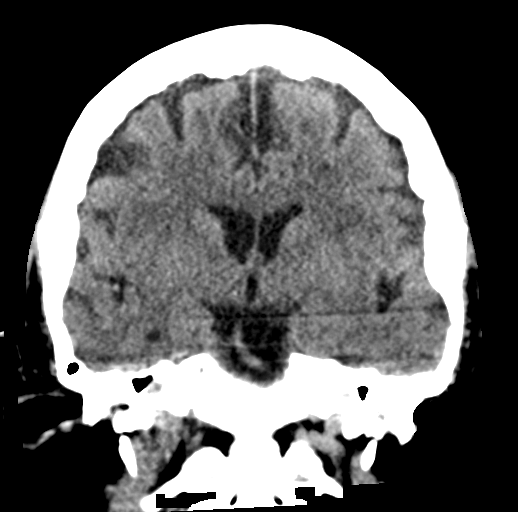

[Series 7: head 3.0 mpr sag · sagittal · 0.31mm/px · 3 of 58 slices shown]
[im 20/58  brain]
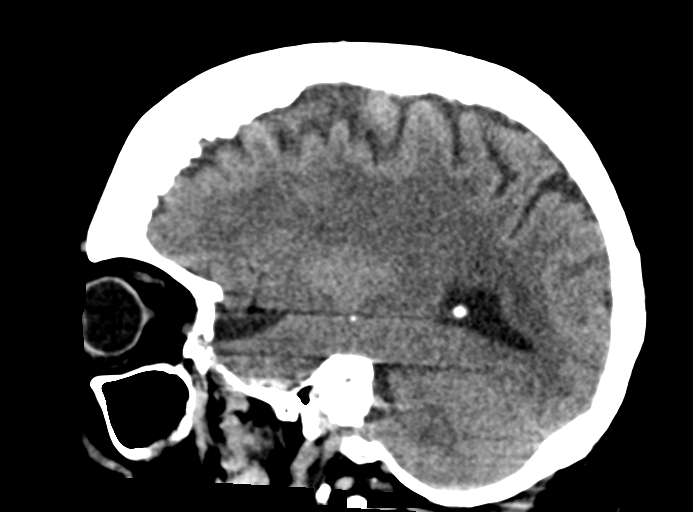
[im 29/58  brain]
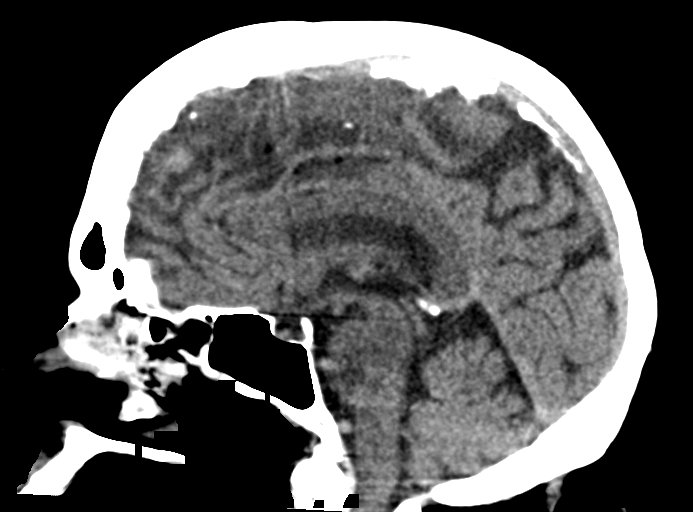
[im 39/58  brain]
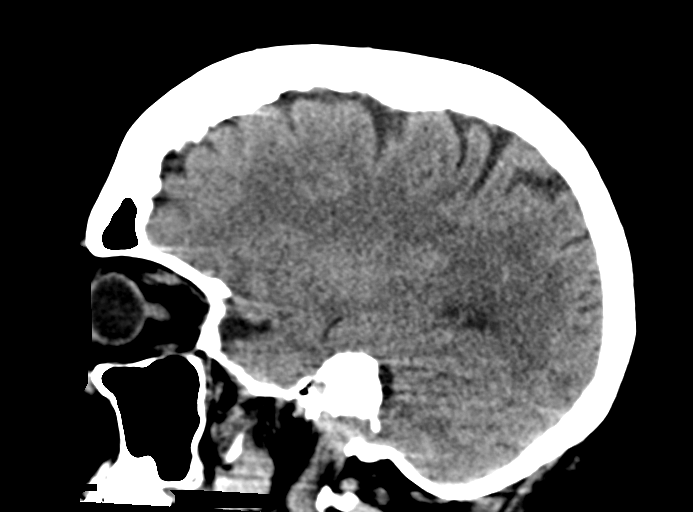

[16 of 47 positions shown; findings below may reference images not displayed]

FINDINGS: Brain: There is no acute intracranial hemorrhage, mass effect, or
edema. Gray-white differentiation is preserved. There is no
extra-axial fluid collection. Ventricles and sulci are within normal
limits in size and configuration. Patchy low-attenuation in the
supratentorial white matter is nonspecific but may reflect chronic
microvascular ischemic changes.

Vascular: There is atherosclerotic calcification at the skull base.

Skull: Calvarium is unremarkable.

Sinuses/Orbits: No acute finding.

Other: None.
IMPRESSION: No evidence of acute intracranial injury.

## 2023-06-24 ENCOUNTER — Encounter (INDEPENDENT_AMBULATORY_CARE_PROVIDER_SITE_OTHER): Payer: Medicare Other | Admitting: Ophthalmology

## 2023-06-24 DIAGNOSIS — H353112 Nonexudative age-related macular degeneration, right eye, intermediate dry stage: Secondary | ICD-10-CM

## 2023-06-24 DIAGNOSIS — H43813 Vitreous degeneration, bilateral: Secondary | ICD-10-CM | POA: Diagnosis not present

## 2023-06-24 DIAGNOSIS — I10 Essential (primary) hypertension: Secondary | ICD-10-CM

## 2023-06-24 DIAGNOSIS — H35033 Hypertensive retinopathy, bilateral: Secondary | ICD-10-CM

## 2023-06-24 DIAGNOSIS — H353221 Exudative age-related macular degeneration, left eye, with active choroidal neovascularization: Secondary | ICD-10-CM | POA: Diagnosis not present

## 2023-06-25 ENCOUNTER — Telehealth: Payer: Self-pay | Admitting: *Deleted

## 2023-06-25 NOTE — Progress Notes (Signed)
 Complex Care Management Care Guide Note  06/25/2023 Name: [name] Molina MRN: [id number] DOB: Jul 03, 1945  [name] Molina is a 78 y.o. year old female who is a primary care patient of Teresa Channel, MD and is actively engaged with the care management team. I reached out to [name] Molina by phone today to assist with re-scheduling  with the RN Case Manager.  Follow up plan: Unsuccessful telephone outreach attempt made. A HIPAA compliant phone message was left for the patient providing contact information and requesting a return call.  [name] Molina  [facility]  [facility]Based [facility] Institute, [facility] Guide  Direct Dial: [phone]  Fax [phone]

## 2023-06-25 NOTE — Progress Notes (Signed)
 Complex Care Management Care Guide Note  06/25/2023 Name: [name] Molina MRN: [id number] DOB: 11/20/1945  [name] Molina is a 78 y.o. year old female who is a primary care patient of Teresa Channel, MD and is actively engaged with the care management team. I reached out to [name] by phone today to assist with re-scheduling  with the RN Case Manager.  Follow up plan: Telephone appointment with complex care management team member scheduled for:  2/21  [name] Molina  [facility]  Value-Based Care Institute, [facility] Guide  Direct Dial: [phone]  Fax [phone]

## 2023-06-30 ENCOUNTER — Ambulatory Visit: Payer: Self-pay | Admitting: Licensed Clinical Social Worker

## 2023-07-03 ENCOUNTER — Telehealth (INDEPENDENT_AMBULATORY_CARE_PROVIDER_SITE_OTHER): Payer: Medicare Other | Admitting: Pulmonary Disease

## 2023-07-03 ENCOUNTER — Ambulatory Visit (HOSPITAL_BASED_OUTPATIENT_CLINIC_OR_DEPARTMENT_OTHER): Payer: Self-pay | Admitting: Emergency Medicine

## 2023-07-03 ENCOUNTER — Other Ambulatory Visit: Payer: Self-pay | Admitting: Pulmonary Disease

## 2023-07-03 DIAGNOSIS — R0602 Shortness of breath: Secondary | ICD-10-CM

## 2023-07-03 MED ORDER — DOXYCYCLINE HYCLATE 100 MG PO TABS: 100.0000 mg | ORAL_TABLET | Freq: Two times a day (BID) | ORAL | 0 refills | Status: DC

## 2023-07-03 MED ORDER — PREDNISONE 20 MG PO TABS: 20.0000 mg | ORAL_TABLET | Freq: Every day | ORAL | 0 refills | Status: DC

## 2023-07-03 MED ORDER — ALBUTEROL SULFATE HFA 108 (90 BASE) MCG/ACT IN AERS: 2.0000 | INHALATION_SPRAY | Freq: Four times a day (QID) | RESPIRATORY_TRACT | 6 refills | Status: DC | PRN

## 2023-07-03 MED ORDER — OSELTAMIVIR PHOSPHATE 75 MG PO CAPS: 75.0000 mg | ORAL_CAPSULE | Freq: Two times a day (BID) | ORAL | 0 refills | Status: DC

## 2023-07-03 NOTE — Telephone Encounter (Signed)
Noted. Mychart visit scheduled.

## 2023-07-03 NOTE — Patient Outreach (Signed)
  Care Coordination   06/30/2023 Name: [name] Molina MRN: [id number] DOB: 01-18-1946   Care Coordination Outreach Attempts:  An unsuccessful outreach was attempted for an appointment today.  Follow Up Plan:  Additional outreach attempts will be made to offer the patient complex care management information and services.   Encounter Outcome:  No Answer   Care Coordination Interventions:  No, not indicated    Jenel Lucks, LCSW [place]  [facility], [facility] Clinical Social Worker Direct Dial: [phone]  Fax: [phone] Website: [email or web address] 5:17 PM

## 2023-07-03 NOTE — Progress Notes (Signed)
Virtual Visit via Video Note  I connected with [name] Molina on 07/03/23 at  2:30 PM EST by a video enabled telemedicine application and verified that I am speaking with the correct person using two identifiers.  Location: Patient: at home Provider: in office at [address]   I discussed the limitations of evaluation and management by telemedicine and the availability of in person appointments. The patient expressed understanding and agreed to proceed.  History of Present Illness:  Patient with chronic respiratory failure/emphysema Usually follows up with Dr. Delton Coombes  Video visit for decompensation of her respiratory status , Shortness of breath, wheezing, cough with mucus production  Family member with flu recently    Observations/Objective:  She does appear comfortable, oxygen nasal cannula in place  Recent records reviewed  Assessment and Plan:  Exposure to fluid  Possible lower respiratory infection  Plan will be to call in a course of antibiotics, steroids, Tamiflu   Follow Up Instructions: Encouraged to give [place] a call with significant concerns   I discussed the assessment and treatment plan with the patient. The patient was provided an opportunity to ask questions and all were answered. The patient agreed with the plan and demonstrated an understanding of the instructions.   The patient was advised to call back or seek an in-person evaluation if the symptoms worsen or if the condition fails to improve as anticipated.  I provided 22 minutes of non-face-to-face time during this encounter.   Tomma Lightning, MD

## 2023-07-03 NOTE — Patient Instructions (Signed)
3 month follow up  Prescription for doxycycline Prednisone Tamiflu  Albuterol nebulization

## 2023-07-03 NOTE — Telephone Encounter (Addendum)
Chief Complaint: Cough Symptoms: cough, SOB, back pain, H.A Frequency: 2-3 days Pertinent Negatives: Patient denies chest pain, fever Disposition: [] ED /[] Urgent Care (no appt availability in office) / [x] Appointment(In office/virtual)/ []  [facility]/ [] Home Care/ [] Refused Recommended Disposition /[] [facility]/ []  Follow-up with PCP Additional Notes: Patient c/o cough. Hx of COPD and on 3L O2 at baseline. Reports cough has worsened and producing green sputum. Of note, pt daughter is Flu A +. Pt only has home COVID test that is currently pending. Denies any fever, chest pain. Pulmonology PAA notified of disposition, patient transferred for scheduling. Patient verbalized understanding and to call back with worsening symptoms/at-home COVID test results.     Reason for Disposition  [1] Known COPD or other severe lung disease (i.e., bronchiectasis, cystic fibrosis, lung surgery) AND [2] worsening symptoms (i.e., increased sputum purulence or amount, increased breathing difficulty  Answer Assessment - Initial Assessment Questions "Chief Complaint (e.g., cough, sob, wheezing, fever, chills, sweat or additional symptoms) *Go to specific symptom protocol after initial questions. cough "How long have symptoms been present?" 2-3 day Have you tested for COVID or Flu? Note: If not, ask patient if a home test can be taken. If so, instruct patient to call back for positive results. Yes has home test-- will test now  MEDICINES:   "Have you used any OTC meds to help with symptoms?" Yes If yes, ask "What medications?" Tylenol for H/A with relief "Have you used your inhalers/maintenance medication?" Yes If yes, "What medications?" Breztri as prescribed If inhaler, ask "How many puffs and how often?" Note: Review instructions on medication in the chart. Taking as prescribed, 2 breaths 2x a day  OXYGEN: "Do you wear supplemental oxygen?" Yes If yes, "How many liters are you  supposed to use?" 3L (orders in chart is 2L) "Do you monitor your oxygen levels?" Yes If yes, "What is your reading (oxygen level) today?" 94-95% 129 HR "What is your usual oxygen saturation reading?"  (Note: Pulmonary O2 sats should be 90% or greater) 97-98% 114 HR    1. ONSET: "When did the cough begin?"      2-3 days ago 2. SEVERITY: "How bad is the cough today?"      Worsened since then 3. SPUTUM: "Describe the color of your sputum" (none, dry cough; clear, white, yellow, green)     White/green 4. HEMOPTYSIS: "Are you coughing up any blood?" If so ask: "How much?" (flecks, streaks, tablespoons, etc.)     no 5. DIFFICULTY BREATHING: "Are you having difficulty breathing?" If Yes, ask: "How bad is it?" (e.g., mild, moderate, severe)    - MILD: No SOB at rest, mild SOB with walking, speaks normally in sentences, can lie down, no retractions, pulse < 100.    - MODERATE: SOB at rest, SOB with minimal exertion and prefers to sit, cannot lie down flat, speaks in phrases, mild retractions, audible wheezing, pulse 100-120.    - SEVERE: Very SOB at rest, speaks in single words, struggling to breathe, sitting hunched forward, retractions, pulse > 120      mild 6. FEVER: "Do you have a fever?" If Yes, ask: "What is your temperature, how was it measured, and when did it start?"     No, 99 7. CARDIAC HISTORY: "Do you have any history of heart disease?" (e.g., heart attack, congestive heart failure)      CHF, HTN 8. LUNG HISTORY: "Do you have any history of lung disease?"  (e.g., pulmonary embolus, asthma, emphysema)  COPD, OSA, O2 9. PE RISK FACTORS: "Do you have a history of blood clots?" (or: recent major surgery, recent prolonged travel, bedridden)     No, but has Afib 10. OTHER SYMPTOMS: "Do you have any other symptoms?" (e.g., runny nose, wheezing, chest pain)       Back pain Sore throat Daughter Flu A + (tamiflu)  Protocols used: Cough - Acute Productive-A-AH

## 2023-07-11 ENCOUNTER — Ambulatory Visit: Payer: Self-pay

## 2023-07-12 NOTE — Patient Outreach (Signed)
 Care Coordination   Follow Up Visit Note   07/12/2023 Name: [name] Molina MRN: [id number] DOB: 11/02/1945  [name] Molina is a 78 y.o. year old female who sees [name] Montana, MD for primary care. I spoke with  [name] Molina by phone today.  What matters to the patients health and wellness today?  Mrs. [name] Molina reported experiencing illness characterized by a cough, congestion, and intermittent shortness of breath. She had been in close proximity to her grandson and daughter, both of whom had been diagnosed with influenza A. Following a video consultation with her physician, Dr. Wynona Molina, on the 13th, she received prescriptions for doxycycline, Tamiflu, and prednisone. The prescribed dosages were as follows: doxycycline 100 milligrams to be taken twice daily, prednisone 20 milligrams once daily, and Tamiflu 75 milligrams to be taken twice daily. [name] Molina indicated that her condition has improved; however, she still experiences a mild cough, absence of fever, and some congestion. Additionally, she reported an oxygen saturation level of approximately 93% during today's evaluation. I advised her to wear a mask around sick family members.        Goals Addressed             This Visit's Progress    To monitor and manage my COPD and FALLS       Patient Goals/Self Care Activities: -Patient/Caregiver will take medications as prescribed   -Patient/Caregiver will attend all scheduled provider appointments -Patient/Caregiver will call pharmacy for medication refills 3-7 days in advance of running out of medications -Patient/Caregiver will call provider office for new concerns or questions  -Patient/Caregiver will focus on medication adherence by taking medications as prescribed   -always check your lines to make sure they are free from holes -Keep your airway clear from mucus build up  -Self assess COPD action plan zone and make appointment with provider if you have been in the yellow zone for 48  hours without improvement -continue to check your O2sat and oxygen hose from the cats -take your fluid pills -Wear your mask when going out  Wear your mask around sick family memebers         SDOH assessments and interventions completed:  No     Care Coordination Interventions:  Yes, provided   Interventions Today    Flowsheet Row Most Recent Value  Chronic Disease   Chronic disease during today's visit Chronic Obstructive Pulmonary Disease (COPD)  General Interventions   General Interventions Discussed/Reviewed General Interventions Discussed, General Interventions Reviewed  Pharmacy Interventions   Pharmacy Dicussed/Reviewed Pharmacy Topics Discussed  Safety Interventions   Safety Discussed/Reviewed Safety Discussed        Follow up plan: Follow up call scheduled for 09/09/23  2 pm    Encounter Outcome:  Patient Visit Completed   [name] Fairly RN, BSN, [facility]  [facility], [facility]  Care Coordinator Phone: [phone]

## 2023-07-12 NOTE — Patient Instructions (Signed)
 Visit Information  Thank you for taking time to visit with me today. Please don't hesitate to contact me if I can be of assistance to you.   Following are the goals we discussed today:   Goals Addressed             This Visit's Progress    To monitor and manage my COPD and FALLS       Patient Goals/Self Care Activities: -Patient/Caregiver will take medications as prescribed   -Patient/Caregiver will attend all scheduled provider appointments -Patient/Caregiver will call pharmacy for medication refills 3-7 days in advance of running out of medications -Patient/Caregiver will call provider office for new concerns or questions  -Patient/Caregiver will focus on medication adherence by taking medications as prescribed   -always check your lines to make sure they are free from holes -Keep your airway clear from mucus build up  -Self assess COPD action plan zone and make appointment with provider if you have been in the yellow zone for 48 hours without improvement -continue to check your O2sat and oxygen hose from the cats -take your fluid pills -Wear your mask when going out  Wear your mask around sick family memebers         Our next appointment is by telephone on 09/09/23 at 2 pm  Please call the care guide team at [phone] if you need to cancel or reschedule your appointment.   If you are experiencing a Mental Health or Behavioral Health Crisis or need someone to talk to, please call 1-800-273-TALK (toll free, 24 hour hotline)  Patient verbalizes understanding of instructions and care plan provided today and agrees to view in MyChart. Active MyChart status and patient understanding of how to access instructions and care plan via MyChart confirmed with patient.     Juanell Fairly RN, BSN, [facility]  [facility], [facility]  Care Coordinator Phone: [phone]

## 2023-07-16 ENCOUNTER — Other Ambulatory Visit: Payer: Self-pay

## 2023-07-16 ENCOUNTER — Encounter (HOSPITAL_COMMUNITY): Payer: Self-pay | Admitting: Emergency Medicine

## 2023-07-16 ENCOUNTER — Emergency Department (HOSPITAL_COMMUNITY): Payer: Medicare Other

## 2023-07-16 ENCOUNTER — Observation Stay (HOSPITAL_COMMUNITY)
Admission: EM | Admit: 2023-07-16 | Discharge: 2023-07-18 | Disposition: A | Discharge status: Home / Self Care | Payer: Medicare Other | Attending: Internal Medicine | Admitting: Internal Medicine

## 2023-07-16 DIAGNOSIS — R2689 Other abnormalities of gait and mobility: Secondary | ICD-10-CM | POA: Insufficient documentation

## 2023-07-16 DIAGNOSIS — R059 Cough, unspecified: Secondary | ICD-10-CM | POA: Diagnosis not present

## 2023-07-16 DIAGNOSIS — Z79899 Other long term (current) drug therapy: Secondary | ICD-10-CM | POA: Diagnosis not present

## 2023-07-16 DIAGNOSIS — Z87891 Personal history of nicotine dependence: Secondary | ICD-10-CM | POA: Diagnosis not present

## 2023-07-16 DIAGNOSIS — Z85828 Personal history of other malignant neoplasm of skin: Secondary | ICD-10-CM | POA: Diagnosis not present

## 2023-07-16 DIAGNOSIS — J189 Pneumonia, unspecified organism: Secondary | ICD-10-CM

## 2023-07-16 DIAGNOSIS — R509 Fever, unspecified: Secondary | ICD-10-CM | POA: Diagnosis present

## 2023-07-16 DIAGNOSIS — L8990 Pressure ulcer of unspecified site, unspecified stage: Secondary | ICD-10-CM | POA: Diagnosis not present

## 2023-07-16 DIAGNOSIS — J4489 Other specified chronic obstructive pulmonary disease: Secondary | ICD-10-CM | POA: Diagnosis not present

## 2023-07-16 DIAGNOSIS — J9611 Chronic respiratory failure with hypoxia: Secondary | ICD-10-CM | POA: Diagnosis present

## 2023-07-16 DIAGNOSIS — R531 Weakness: Secondary | ICD-10-CM

## 2023-07-16 DIAGNOSIS — I110 Hypertensive heart disease with heart failure: Secondary | ICD-10-CM | POA: Diagnosis not present

## 2023-07-16 DIAGNOSIS — I959 Hypotension, unspecified: Secondary | ICD-10-CM | POA: Diagnosis not present

## 2023-07-16 DIAGNOSIS — F411 Generalized anxiety disorder: Secondary | ICD-10-CM | POA: Diagnosis not present

## 2023-07-16 DIAGNOSIS — I700 Atherosclerosis of aorta: Secondary | ICD-10-CM | POA: Diagnosis not present

## 2023-07-16 DIAGNOSIS — I480 Paroxysmal atrial fibrillation: Secondary | ICD-10-CM | POA: Diagnosis not present

## 2023-07-16 DIAGNOSIS — K219 Gastro-esophageal reflux disease without esophagitis: Secondary | ICD-10-CM | POA: Diagnosis not present

## 2023-07-16 DIAGNOSIS — J111 Influenza due to unidentified influenza virus with other respiratory manifestations: Secondary | ICD-10-CM | POA: Diagnosis present

## 2023-07-16 DIAGNOSIS — R519 Headache, unspecified: Secondary | ICD-10-CM | POA: Diagnosis not present

## 2023-07-16 DIAGNOSIS — I5032 Chronic diastolic (congestive) heart failure: Secondary | ICD-10-CM | POA: Insufficient documentation

## 2023-07-16 DIAGNOSIS — I3481 Nonrheumatic mitral (valve) annulus calcification: Secondary | ICD-10-CM | POA: Diagnosis not present

## 2023-07-16 DIAGNOSIS — R000 Tachycardia, unspecified: Secondary | ICD-10-CM | POA: Diagnosis not present

## 2023-07-16 DIAGNOSIS — J449 Chronic obstructive pulmonary disease, unspecified: Secondary | ICD-10-CM | POA: Diagnosis present

## 2023-07-16 DIAGNOSIS — E785 Hyperlipidemia, unspecified: Secondary | ICD-10-CM | POA: Diagnosis present

## 2023-07-16 DIAGNOSIS — R0602 Shortness of breath: Secondary | ICD-10-CM | POA: Diagnosis not present

## 2023-07-16 DIAGNOSIS — J101 Influenza due to other identified influenza virus with other respiratory manifestations: Secondary | ICD-10-CM | POA: Diagnosis not present

## 2023-07-16 LAB — I-STAT CG4 LACTIC ACID, ED: Lactic Acid, Venous: 1.1 mmol/L (ref 0.5–1.9)

## 2023-07-16 NOTE — ED Triage Notes (Signed)
 Patient bib ems from home for weakness and flu like symptoms. Family in house tested positive for flu and patient was treated with tamiflu, doxycycline, and prednisone. Patient reports symptoms improving but worsening this afternoon. TMAX at home 102.3. 1g tylenol taken PTA.    BP 124/62  HR 110-114 RR 24  SPO2 97% 3L home O2 CBG 182

## 2023-07-16 NOTE — ED Notes (Signed)
 PT going to xray

## 2023-07-16 NOTE — ED Provider Notes (Signed)
 [place] EMERGENCY DEPARTMENT AT [facility] Provider Note   CSN: [id number] Arrival date & time: 07/16/23  2042    History  Chief Complaint  Patient presents with   Weakness    [name] Molina is a 78 y.o. female hypertension, COPD, A-fib on anticoagulation, CHF here for evaluation of cough and fever.  Patient states she was seen by pulmonology via telehealth visit on 2/13.  Her family at that time was diagnosed with influenza.  She was given a prescription for doxycycline, prednisone and Tamiflu.  She started feeling better after starting the medication.  She completed her doxycycline on Sunday.  Yesterday developed some myalgias, cough productive of green sputum she does have a chronic cough at baseline.  Today she had increasing generalized weakness.  No recent falls or injuries however states she is having difficulty getting up from the toilet due to her generalized weakness.  She typically uses a rollator which she sits on her a wheelchair.  She is not very ambulatory at baseline.  She has had some nausea without vomiting.  No chest pain, shortness of breath, abdominal pain.  She is some chronic swelling and neuropathy in her bilateral lower extremities which is unchanged.  Had a fever of 102.3 at home which he subsequently took 1 g of Tylenol    HPI     Home Medications Prior to Admission medications   Medication Sig Start Date End Date Taking? Authorizing Provider  acetaminophen (TYLENOL) 650 MG CR tablet Take 1,300 mg by mouth every 8 (eight) hours as needed for pain.    [provider]  albuterol (VENTOLIN HFA) 108 (90 Base) MCG/ACT inhaler Inhale 2 puffs into the lungs every 6 (six) hours as needed for wheezing or shortness of breath. 07/04/23   Olalere, Onnie Boer A, MD  ALLERGY RELIEF CETIRIZINE 10 MG tablet TAKE 1 TABLET BY MOUTH ONCE DAILY 02/07/23   Leslye Peer, MD  BESIVANCE 0.6 % SUSP Place 1 drop into the left eye See admin instructions. Instill 1 drop  into the left eye 4 times daily the day OF and day AFTER (eye injection) 09/07/19   [provider]  Budeson-Glycopyrrol-Formoterol (BREZTRI AEROSPHERE) 160-9-4.8 MCG/ACT AERO Inhale 2 puffs into the lungs in the morning and at bedtime. 06/02/23   Leslye Peer, MD  Cholecalciferol 50 MCG (2000 UT) CAPS Take by mouth.    [provider]  diltiazem (CARDIZEM CD) 240 MG 24 hr capsule TAKE 1 CAPSULE BY MOUTH EVERY MORNING 06/09/23   Jake Bathe, MD  doxycycline (VIBRA-TABS) 100 MG tablet Take 1 tablet (100 mg total) by mouth 2 (two) times daily. 07/03/23   Olalere, Minna Antis, MD  DULoxetine (CYMBALTA) 60 MG capsule Take 60 mg by mouth every morning. 03/23/20   [provider]  ELIQUIS 5 MG TABS tablet TAKE ONE TABLET BY MOUTH EVERY MORNING and TAKE ONE TABLET BY MOUTH EVERYDAY AT BEDTIME 11/22/22   Camnitz, Will Daphine Deutscher, MD  flecainide (TAMBOCOR) 50 MG tablet TAKE 1 TABLET BY MOUTH AT [facility] AND AT BEDTIME 05/07/23   Jake Bathe, MD  fluticasone (FLONASE) 50 MCG/ACT nasal spray Place 1 spray into both nostrils daily. 07/19/16   Leslye Peer, MD  furosemide (LASIX) 40 MG tablet TAKE 1 TABLET BY MOUTH EVERY MORNING 06/09/23   Jake Bathe, MD  ipratropium-albuterol (DUONEB) 0.5-2.5 (3) MG/3ML SOLN Take 3 mLs by nebulization every 4 (four) hours as needed (shortness of breath). Patient not taking: Reported on  07/03/2023 05/29/23   Leslye Peer, MD  LORazepam (ATIVAN) 0.5 MG tablet Take 1-2 tablets (0.5-1 mg total) by mouth daily as needed for anxiety. Anxiety 03/15/21   Amin, Ankit C, MD  metoprolol succinate (TOPROL-XL) 50 MG 24 hr tablet Take 1 tablet (50 mg total) by mouth at bedtime. Take with or immediately following a meal. 05/22/23   Jake Bathe, MD  montelukast (SINGULAIR) 10 MG tablet Take 10 mg by mouth at bedtime. 02/21/14   [provider]  Multiple Vitamins-Minerals (PRESERVISION AREDS 2+MULTI VIT) CAPS Take 1 tablet by mouth 2 (two) times daily.     [provider]  oseltamivir (TAMIFLU) 75 MG capsule Take 1 capsule (75 mg total) by mouth 2 (two) times daily. 07/03/23   Olalere, Onnie Boer A, MD  OXYGEN Inhale 3 L into the lungs continuous.    [provider]  pantoprazole (PROTONIX) 40 MG tablet Take 1 tablet (40 mg total) by mouth daily. 07/11/21   Cobb, Ruby Cola, NP  pravastatin (PRAVACHOL) 40 MG tablet Take 40 mg by mouth at bedtime.  09/04/10   [provider]  predniSONE (DELTASONE) 20 MG tablet Take 1 tablet (20 mg total) by mouth daily with breakfast. 07/03/23   Tomma Lightning, MD      Allergies    Food, Levaquin [levofloxacin in d5w], Tequin, Erythromycin, Oxycodone-acetaminophen, Codeine, Penicillins, Prednisone, and Wellbutrin [bupropion hcl]    Review of Systems   Review of Systems  Constitutional:  Positive for activity change, appetite change, fatigue and fever.  HENT:  Positive for congestion. Negative for sore throat, trouble swallowing and voice change.   Respiratory:  Positive for cough. Negative for chest tightness, shortness of breath, wheezing and stridor.   Cardiovascular:  Positive for leg swelling (chronic).  Gastrointestinal:  Positive for nausea. Negative for abdominal distention, abdominal pain, constipation, diarrhea and vomiting.  Genitourinary: Negative.   Skin: Negative.   Neurological:  Positive for weakness (generalized).  All other systems reviewed and are negative.   Physical Exam Updated Vital Signs BP 115/68   Pulse (!) 104   Temp 99.8 F (37.7 C) (Oral)   Resp (!) 22   Ht 5\' 2"  (1.575 m)   Wt 99.8 kg   SpO2 98%   BMI 40.24 kg/m  Physical Exam Vitals and nursing note reviewed.  Constitutional:      General: She is not in acute distress.    Appearance: She is well-developed. She is ill-appearing (chronically ill appearing). She is not toxic-appearing.  HENT:     Head: Normocephalic and atraumatic.  Eyes:     Pupils: Pupils are equal, round, and reactive to  light.  Cardiovascular:     Rate and Rhythm: Normal rate.     Pulses: Normal pulses.     Heart sounds: Normal heart sounds.  Pulmonary:     Effort: Pulmonary effort is normal. No respiratory distress.     Comments: Minimal coarse lung sounds at bases bilaterally Abdominal:     General: Bowel sounds are normal. There is no distension.     Palpations: Abdomen is soft.     Tenderness: There is no abdominal tenderness. There is no right CVA tenderness, left CVA tenderness or guarding.  Musculoskeletal:        General: No swelling, tenderness or signs of injury. Normal range of motion.     Cervical back: Normal range of motion.     Right lower leg: Edema present.     Left lower leg:  Edema present.     Comments: Chronic bilateral lower extremity edema  Skin:    General: Skin is warm and dry.     Capillary Refill: Capillary refill takes less than 2 seconds.     Comments: No obvious rashes or lesions to exposed skin  Neurological:     General: No focal deficit present.     Mental Status: She is alert.  Psychiatric:        Mood and Affect: Mood normal.     ED Results / Procedures / Treatments   Labs (all labs ordered are listed, but only abnormal results are displayed) Labs Reviewed  RESP PANEL BY RT-PCR (RSV, FLU A&B, COVID)  RVPGX2  CULTURE, BLOOD (ROUTINE X 2)  CULTURE, BLOOD (ROUTINE X 2)  CBC WITH DIFFERENTIAL/PLATELET  URINALYSIS, W/ REFLEX TO CULTURE (INFECTION SUSPECTED)  BRAIN NATRIURETIC PEPTIDE  COMPREHENSIVE METABOLIC PANEL  LIPASE, BLOOD  I-STAT CG4 LACTIC ACID, ED  I-STAT CG4 LACTIC ACID, ED    EKG None  Radiology DG Chest 2 View Result Date: 07/16/2023 CLINICAL DATA:  Shortness of breath EXAM: CHEST - 2 VIEW COMPARISON:  06/27/2021 FINDINGS: Cardiac shadow is stable. Aortic calcifications are noted. Lungs are clear bilaterally. No focal infiltrate or sizable effusion is seen. No bony abnormality is noted. IMPRESSION: No acute abnormality seen. Electronically  Signed   By: Alcide Clever M.D.   On: 07/16/2023 22:21    Procedures Procedures    Medications Ordered in ED Medications - No data to display  ED Course/ Medical Decision Making/ A&P   78 year old multiple medical problems here for evaluation of fever, cough and generalized weakness.  Sounds like URI symptoms developed about 2 weeks ago, family members were diagnosed with influenza.  She got better after being treated with Tamiflu, doxycycline and prednisone, subsequently worsened a few days ago.  Noted fever to 102.3 today, generalized weakness.  She is not very ambulatory at baseline, can stand to pivot however that is about it.  No urinary symptoms.  Some nausea that vomiting.  No abdominal pain, diarrhea.  No chest pain or shortness of breath.  Plan on labs imaging and reassess  Labs and imaging personally viewed and interpreted:  Chest x-ray without acute abnormality  Care transferred to oncoming provider who will FU on labs and dispo                                 Medical Decision Making Amount and/or Complexity of Data Reviewed Independent Historian: EMS External Data Reviewed: labs, radiology, ECG and notes. Labs: ordered. Decision-making details documented in ED Course. Radiology: ordered and independent interpretation performed. Decision-making details documented in ED Course. ECG/medicine tests: ordered and independent interpretation performed. Decision-making details documented in ED Course.  Risk OTC drugs. Prescription drug management. Parenteral controlled substances. Decision regarding hospitalization. Diagnosis or treatment significantly limited by social determinants of health.          Final Clinical Impression(s) / ED Diagnoses Final diagnoses:  None    Rx / DC Orders ED Discharge Orders     None         [place] Kestner A, PA-C 07/16/23 2338    Alvira Monday, MD 07/17/23 1115

## 2023-07-17 ENCOUNTER — Encounter (HOSPITAL_COMMUNITY): Payer: Self-pay | Admitting: Internal Medicine

## 2023-07-17 DIAGNOSIS — J111 Influenza due to unidentified influenza virus with other respiratory manifestations: Secondary | ICD-10-CM

## 2023-07-17 DIAGNOSIS — J101 Influenza due to other identified influenza virus with other respiratory manifestations: Secondary | ICD-10-CM | POA: Diagnosis not present

## 2023-07-17 DIAGNOSIS — J189 Pneumonia, unspecified organism: Secondary | ICD-10-CM | POA: Diagnosis not present

## 2023-07-17 DIAGNOSIS — K219 Gastro-esophageal reflux disease without esophagitis: Secondary | ICD-10-CM | POA: Diagnosis not present

## 2023-07-17 DIAGNOSIS — J9611 Chronic respiratory failure with hypoxia: Secondary | ICD-10-CM

## 2023-07-17 DIAGNOSIS — F411 Generalized anxiety disorder: Secondary | ICD-10-CM | POA: Diagnosis not present

## 2023-07-17 DIAGNOSIS — I3481 Nonrheumatic mitral (valve) annulus calcification: Secondary | ICD-10-CM | POA: Insufficient documentation

## 2023-07-17 DIAGNOSIS — I480 Paroxysmal atrial fibrillation: Secondary | ICD-10-CM

## 2023-07-17 DIAGNOSIS — J410 Simple chronic bronchitis: Secondary | ICD-10-CM

## 2023-07-17 DIAGNOSIS — E7849 Other hyperlipidemia: Secondary | ICD-10-CM | POA: Diagnosis not present

## 2023-07-17 DIAGNOSIS — R531 Weakness: Secondary | ICD-10-CM

## 2023-07-17 LAB — COMPREHENSIVE METABOLIC PANEL
ALT: 12 U/L (ref 0–44)
ALT: 14 U/L (ref 0–44)
AST: 16 U/L (ref 15–41)
AST: 18 U/L (ref 15–41)
Albumin: 3.1 g/dL — ABNORMAL LOW (ref 3.5–5.0)
Albumin: 3.4 g/dL — ABNORMAL LOW (ref 3.5–5.0)
Alkaline Phosphatase: 59 U/L (ref 38–126)
Alkaline Phosphatase: 60 U/L (ref 38–126)
Anion gap: 13 (ref 5–15)
Anion gap: 13 (ref 5–15)
BUN: 13 mg/dL (ref 8–23)
BUN: 13 mg/dL (ref 8–23)
CO2: 23 mmol/L (ref 22–32)
CO2: 26 mmol/L (ref 22–32)
Calcium: 8.5 mg/dL — ABNORMAL LOW (ref 8.9–10.3)
Calcium: 9 mg/dL (ref 8.9–10.3)
Chloride: 102 mmol/L (ref 98–111)
Chloride: 103 mmol/L (ref 98–111)
Creatinine, Ser: 0.86 mg/dL (ref 0.44–1.00)
Creatinine, Ser: 0.95 mg/dL (ref 0.44–1.00)
GFR, Estimated: >60 mL/min (ref >60)
GFR, Estimated: >60 mL/min (ref >60)
Glucose, Bld: 117 mg/dL — ABNORMAL HIGH (ref 70–99)
Glucose, Bld: 231 mg/dL — ABNORMAL HIGH (ref 70–99)
Potassium: 3.3 mmol/L — ABNORMAL LOW (ref 3.5–5.1)
Potassium: 3.6 mmol/L (ref 3.5–5.1)
Sodium: 139 mmol/L (ref 135–145)
Sodium: 141 mmol/L (ref 135–145)
Total Bilirubin: 1.7 mg/dL — ABNORMAL HIGH (ref 0.0–1.2)
Total Bilirubin: 1.9 mg/dL — ABNORMAL HIGH (ref 0.0–1.2)
Total Protein: 6.2 g/dL — ABNORMAL LOW (ref 6.5–8.1)
Total Protein: 6.4 g/dL — ABNORMAL LOW (ref 6.5–8.1)

## 2023-07-17 LAB — CBC
HCT: 40.3 % (ref 36.0–46.0)
Hemoglobin: 12.7 g/dL (ref 12.0–15.0)
MCH: 26.9 pg (ref 26.0–34.0)
MCHC: 31.5 g/dL (ref 30.0–36.0)
MCV: 85.4 fL (ref 80.0–100.0)
Platelets: 192 10*3/uL (ref 150–400)
RBC: 4.72 MIL/uL (ref 3.87–5.11)
RDW: 15 % (ref 11.5–15.5)
WBC: 13.1 10*3/uL — ABNORMAL HIGH (ref 4.0–10.5)
nRBC: 0 % (ref 0.0–0.2)

## 2023-07-17 LAB — URINALYSIS, W/ REFLEX TO CULTURE (INFECTION SUSPECTED)
Bacteria, UA: NONE SEEN
Bilirubin Urine: NEGATIVE
Glucose, UA: 50 mg/dL — AB
Hgb urine dipstick: NEGATIVE
Ketones, ur: 5 mg/dL — AB
Nitrite: NEGATIVE
Protein, ur: 30 mg/dL — AB
Specific Gravity, Urine: 1.03 (ref 1.005–1.030)
pH: 5 (ref 5.0–8.0)

## 2023-07-17 LAB — CBC WITH DIFFERENTIAL/PLATELET
Abs Immature Granulocytes: 0.14 10*3/uL — ABNORMAL HIGH (ref 0.00–0.07)
Basophils Absolute: 0.1 10*3/uL (ref 0.0–0.1)
Basophils Relative: 0 %
Eosinophils Absolute: 0 10*3/uL (ref 0.0–0.5)
Eosinophils Relative: 0 %
HCT: 41.5 % (ref 36.0–46.0)
Hemoglobin: 13.5 g/dL (ref 12.0–15.0)
Immature Granulocytes: 1 %
Lymphocytes Relative: 6 %
Lymphs Abs: 1 10*3/uL (ref 0.7–4.0)
MCH: 27.4 pg (ref 26.0–34.0)
MCHC: 32.5 g/dL (ref 30.0–36.0)
MCV: 84.3 fL (ref 80.0–100.0)
Monocytes Absolute: 1.3 10*3/uL — ABNORMAL HIGH (ref 0.1–1.0)
Monocytes Relative: 8 %
Neutro Abs: 12.8 10*3/uL — ABNORMAL HIGH (ref 1.7–7.7)
Neutrophils Relative %: 85 %
Platelets: 215 10*3/uL (ref 150–400)
RBC: 4.92 MIL/uL (ref 3.87–5.11)
RDW: 15.1 % (ref 11.5–15.5)
WBC: 15.3 10*3/uL — ABNORMAL HIGH (ref 4.0–10.5)
nRBC: 0 % (ref 0.0–0.2)

## 2023-07-17 LAB — RESP PANEL BY RT-PCR (RSV, FLU A&B, COVID)  RVPGX2
Influenza A by PCR: POSITIVE — AB
Influenza B by PCR: NEGATIVE
Resp Syncytial Virus by PCR: NEGATIVE
SARS Coronavirus 2 by RT PCR: NEGATIVE

## 2023-07-17 LAB — BRAIN NATRIURETIC PEPTIDE: B Natriuretic Peptide: 77.2 pg/mL (ref 0.0–100.0)

## 2023-07-17 LAB — LIPASE, BLOOD: Lipase: 27 U/L (ref 11–51)

## 2023-07-17 LAB — GLUCOSE, CAPILLARY
Glucose-Capillary: 139 mg/dL — ABNORMAL HIGH (ref 70–99)
Glucose-Capillary: 122 mg/dL — ABNORMAL HIGH (ref 70–99)

## 2023-07-17 LAB — PROCALCITONIN: Procalcitonin: <0.1 ng/mL

## 2023-07-17 MED ORDER — MONTELUKAST SODIUM 10 MG PO TABS
10.0000 mg | ORAL_TABLET | Freq: Every day | ORAL | Status: DC
  Administered 2023-07-17: 10 mg via ORAL
  Filled 2023-07-17: qty 1

## 2023-07-17 MED ORDER — OSELTAMIVIR PHOSPHATE 30 MG PO CAPS
30.0000 mg | ORAL_CAPSULE | Freq: Two times a day (BID) | ORAL | Status: DC
  Administered 2023-07-17 – 2023-07-18 (×2): 30 mg via ORAL
  Filled 2023-07-17 (×4): qty 1

## 2023-07-17 MED ORDER — SODIUM CHLORIDE 0.9% FLUSH
3.0000 mL | Freq: Two times a day (BID) | INTRAVENOUS | Status: DC
  Administered 2023-07-17 – 2023-07-18 (×4): 3 mL via INTRAVENOUS

## 2023-07-17 MED ORDER — ONDANSETRON HCL 4 MG/2ML IJ SOLN: 4.0000 mg | Freq: Four times a day (QID) | INTRAMUSCULAR | Status: DC | PRN

## 2023-07-17 MED ORDER — GUAIFENESIN ER 600 MG PO TB12
600.0000 mg | ORAL_TABLET | Freq: Two times a day (BID) | ORAL | Status: DC
  Administered 2023-07-17 – 2023-07-18 (×3): 600 mg via ORAL
  Filled 2023-07-17 (×3): qty 1

## 2023-07-17 MED ORDER — UMECLIDINIUM BROMIDE 62.5 MCG/ACT IN AEPB
1.0000 | INHALATION_SPRAY | Freq: Every day | RESPIRATORY_TRACT | Status: DC
  Administered 2023-07-17 – 2023-07-18 (×2): 1 via RESPIRATORY_TRACT
  Filled 2023-07-17: qty 7

## 2023-07-17 MED ORDER — LORAZEPAM 1 MG PO TABS: 0.5000 mg | ORAL_TABLET | Freq: Every day | ORAL | Status: DC | PRN

## 2023-07-17 MED ORDER — INSULIN ASPART 100 UNIT/ML IJ SOLN
0.0000 [IU] | Freq: Three times a day (TID) | INTRAMUSCULAR | Status: DC
  Administered 2023-07-17: 2 [IU] via SUBCUTANEOUS

## 2023-07-17 MED ORDER — OSELTAMIVIR PHOSPHATE 75 MG PO CAPS
75.0000 mg | ORAL_CAPSULE | Freq: Two times a day (BID) | ORAL | Status: DC
  Filled 2023-07-17: qty 1

## 2023-07-17 MED ORDER — FLUTICASONE FUROATE-VILANTEROL 100-25 MCG/ACT IN AEPB
1.0000 | INHALATION_SPRAY | Freq: Every day | RESPIRATORY_TRACT | Status: DC
  Administered 2023-07-17 – 2023-07-18 (×2): 1 via RESPIRATORY_TRACT
  Filled 2023-07-17 (×2): qty 28

## 2023-07-17 MED ORDER — SODIUM CHLORIDE 0.9% FLUSH: 3.0000 mL | INTRAVENOUS | Status: DC | PRN

## 2023-07-17 MED ORDER — FLECAINIDE ACETATE 50 MG PO TABS
50.0000 mg | ORAL_TABLET | Freq: Two times a day (BID) | ORAL | Status: DC
  Administered 2023-07-17 – 2023-07-18 (×3): 50 mg via ORAL
  Filled 2023-07-17 (×4): qty 1

## 2023-07-17 MED ORDER — ONDANSETRON HCL 4 MG PO TABS
4.0000 mg | ORAL_TABLET | Freq: Four times a day (QID) | ORAL | Status: DC | PRN
Start: 2023-07-17 — End: 2023-07-18

## 2023-07-17 MED ORDER — METOPROLOL SUCCINATE ER 50 MG PO TB24
50.0000 mg | ORAL_TABLET | Freq: Every day | ORAL | Status: DC
  Administered 2023-07-17: 50 mg via ORAL
  Filled 2023-07-17: qty 1

## 2023-07-17 MED ORDER — PREDNISONE 20 MG PO TABS
40.0000 mg | ORAL_TABLET | Freq: Every day | ORAL | Status: DC
  Administered 2023-07-17 – 2023-07-18 (×2): 40 mg via ORAL
  Filled 2023-07-17 (×2): qty 2

## 2023-07-17 MED ORDER — SODIUM CHLORIDE 0.9 % IV SOLN: 250.0000 mL | INTRAVENOUS | Status: AC | PRN

## 2023-07-17 MED ORDER — DULOXETINE HCL 60 MG PO CPEP
60.0000 mg | ORAL_CAPSULE | Freq: Every morning | ORAL | Status: DC
Start: 2023-07-17 — End: 2023-07-18
  Administered 2023-07-17 – 2023-07-18 (×2): 60 mg via ORAL
  Filled 2023-07-17: qty 1
  Filled 2023-07-17: qty 2

## 2023-07-17 MED ORDER — BUDESON-GLYCOPYRROL-FORMOTEROL 160-9-4.8 MCG/ACT IN AERO: 2.0000 | INHALATION_SPRAY | Freq: Every day | RESPIRATORY_TRACT | Status: DC

## 2023-07-17 MED ORDER — SODIUM CHLORIDE 0.9 % IV SOLN
100.0000 mg | Freq: Two times a day (BID) | INTRAVENOUS | Status: DC
  Administered 2023-07-17: 100 mg via INTRAVENOUS
  Filled 2023-07-17: qty 100

## 2023-07-17 MED ORDER — DILTIAZEM HCL ER COATED BEADS 240 MG PO CP24
240.0000 mg | ORAL_CAPSULE | Freq: Every morning | ORAL | Status: DC
  Administered 2023-07-18: 240 mg via ORAL
  Filled 2023-07-17 (×2): qty 1

## 2023-07-17 MED ORDER — APIXABAN 5 MG PO TABS
5.0000 mg | ORAL_TABLET | Freq: Two times a day (BID) | ORAL | Status: DC
  Administered 2023-07-17 – 2023-07-18 (×3): 5 mg via ORAL
  Filled 2023-07-17 (×3): qty 1

## 2023-07-17 MED ORDER — PANTOPRAZOLE SODIUM 40 MG PO TBEC
40.0000 mg | DELAYED_RELEASE_TABLET | Freq: Every day | ORAL | Status: DC
  Administered 2023-07-17 – 2023-07-18 (×2): 40 mg via ORAL
  Filled 2023-07-17 (×2): qty 1

## 2023-07-17 MED ORDER — GUAIFENESIN ER 600 MG PO TB12: 600.0000 mg | ORAL_TABLET | Freq: Two times a day (BID) | ORAL | Status: DC | PRN

## 2023-07-17 MED ORDER — PRAVASTATIN SODIUM 40 MG PO TABS
40.0000 mg | ORAL_TABLET | Freq: Every day | ORAL | Status: DC
  Administered 2023-07-17: 40 mg via ORAL
  Filled 2023-07-17: qty 1

## 2023-07-17 MED ORDER — ACETAMINOPHEN 325 MG PO TABS
650.0000 mg | ORAL_TABLET | Freq: Once | ORAL | Status: AC
  Administered 2023-07-17: 650 mg via ORAL
  Filled 2023-07-17: qty 2

## 2023-07-17 MED ORDER — POTASSIUM CHLORIDE CRYS ER 20 MEQ PO TBCR
20.0000 meq | EXTENDED_RELEASE_TABLET | Freq: Once | ORAL | Status: AC
  Administered 2023-07-17: 20 meq via ORAL
  Filled 2023-07-17: qty 1

## 2023-07-17 MED ORDER — IPRATROPIUM-ALBUTEROL 0.5-2.5 (3) MG/3ML IN SOLN
3.0000 mL | RESPIRATORY_TRACT | Status: DC | PRN
  Administered 2023-07-17: 3 mL via RESPIRATORY_TRACT
  Filled 2023-07-17: qty 3

## 2023-07-17 MED ORDER — OSELTAMIVIR PHOSPHATE 75 MG PO CAPS
75.0000 mg | ORAL_CAPSULE | Freq: Once | ORAL | Status: AC
  Administered 2023-07-17: 75 mg via ORAL
  Filled 2023-07-17: qty 1

## 2023-07-17 MED ORDER — ORAL CARE MOUTH RINSE: 15.0000 mL | OROMUCOSAL | Status: DC | PRN

## 2023-07-17 MED ORDER — SODIUM CHLORIDE 0.9% FLUSH
3.0000 mL | Freq: Two times a day (BID) | INTRAVENOUS | Status: DC
  Administered 2023-07-17 (×3): 3 mL via INTRAVENOUS

## 2023-07-17 MED ORDER — SODIUM CHLORIDE 0.9 % IV BOLUS
1000.0000 mL | Freq: Once | INTRAVENOUS | Status: AC
  Administered 2023-07-17: 1000 mL via INTRAVENOUS

## 2023-07-17 MED ORDER — POTASSIUM CHLORIDE CRYS ER 20 MEQ PO TBCR
40.0000 meq | EXTENDED_RELEASE_TABLET | Freq: Once | ORAL | Status: AC
  Administered 2023-07-17: 40 meq via ORAL
  Filled 2023-07-17: qty 2

## 2023-07-17 MED ORDER — SODIUM CHLORIDE 0.9 % IV SOLN
2.0000 g | INTRAVENOUS | Status: DC
  Administered 2023-07-17: 2 g via INTRAVENOUS
  Filled 2023-07-17: qty 20

## 2023-07-17 MED ORDER — IBUPROFEN 200 MG PO TABS: 400.0000 mg | ORAL_TABLET | Freq: Four times a day (QID) | ORAL | Status: DC | PRN

## 2023-07-17 MED ORDER — DILTIAZEM HCL ER COATED BEADS 240 MG PO CP24: 240.0000 mg | ORAL_CAPSULE | Freq: Every morning | ORAL | Status: DC

## 2023-07-17 NOTE — Plan of Care (Signed)
  Problem: Education: Goal: Knowledge of General Education information will improve Description: Including pain rating scale, medication(s)/side effects and non-pharmacologic comfort measures Outcome: Progressing   Problem: Health Behavior/Discharge Planning: Goal: Ability to manage health-related needs will improve Outcome: Progressing   Problem: Clinical Measurements: Goal: Ability to maintain clinical measurements within normal limits will improve Outcome: Progressing Goal: Will remain free from infection Outcome: Progressing Goal: Diagnostic test results will improve Outcome: Progressing Goal: Respiratory complications will improve Outcome: Progressing Goal: Cardiovascular complication will be avoided Outcome: Progressing   Problem: Activity: Goal: Risk for activity intolerance will decrease Outcome: Progressing   Problem: Nutrition: Goal: Adequate nutrition will be maintained Outcome: Progressing   Problem: Coping: Goal: Level of anxiety will decrease Outcome: Progressing   Problem: Elimination: Goal: Will not experience complications related to bowel motility Outcome: Progressing Goal: Will not experience complications related to urinary retention Outcome: Progressing   Problem: Pain Managment: Goal: General experience of comfort will improve and/or be controlled Outcome: Progressing   Problem: Safety: Goal: Ability to remain free from injury will improve Outcome: Progressing   Problem: Skin Integrity: Goal: Risk for impaired skin integrity will decrease Outcome: Progressing   Problem: Education: Goal: Ability to describe self-care measures that may prevent or decrease complications (Diabetes Survival Skills Education) will improve Outcome: Progressing Goal: Individualized Educational Video(s) Outcome: Progressing   Problem: Coping: Goal: Ability to adjust to condition or change in health will improve Outcome: Progressing   Problem: Fluid  Volume: Goal: Ability to maintain a balanced intake and output will improve Outcome: Progressing   Problem: Health Behavior/Discharge Planning: Goal: Ability to identify and utilize available resources and services will improve Outcome: Progressing Goal: Ability to manage health-related needs will improve Outcome: Progressing   Problem: Metabolic: Goal: Ability to maintain appropriate glucose levels will improve Outcome: Progressing   Problem: Nutritional: Goal: Maintenance of adequate nutrition will improve Outcome: Progressing Goal: Progress toward achieving an optimal weight will improve Outcome: Progressing   Problem: Skin Integrity: Goal: Risk for impaired skin integrity will decrease Outcome: Progressing   Problem: Tissue Perfusion: Goal: Adequacy of tissue perfusion will improve Outcome: Progressing   Problem: Education: Goal: Knowledge of disease or condition will improve Outcome: Progressing Goal: Knowledge of the prescribed therapeutic regimen will improve Outcome: Progressing Goal: Individualized Educational Video(s) Outcome: Progressing   Problem: Activity: Goal: Ability to tolerate increased activity will improve Outcome: Progressing Goal: Will verbalize the importance of balancing activity with adequate rest periods Outcome: Progressing   Problem: Respiratory: Goal: Ability to maintain a clear airway will improve Outcome: Progressing Goal: Levels of oxygenation will improve Outcome: Progressing Goal: Ability to maintain adequate ventilation will improve Outcome: Progressing

## 2023-07-17 NOTE — Progress Notes (Addendum)
 PROGRESS NOTE                                                                                                                                                                                                             Patient Demographics:    [name] Molina, is a 78 y.o. female, DOB - 1945/12/27, ZOX:[id number]  Outpatient Primary MD for the patient is Laurann Montana, MD    LOS - 0  Admit date - 07/16/2023    Chief Complaint  Patient presents with   Weakness       Brief Narrative (HPI from H&P)   [name] Molina is a 78 y.o. female with medical history significant of chronic hypoxic respiratory failure due to 4 L oxygen at baseline, COPD, chronic allergic rhinitis, GERD, paroxysmal atrial fibrillation, severe mitral annular calcification, generalized anxiety disorder, and hyperlipidemia who presented to emergency department with complaining of generalized weakness and flulike symptoms.  Patient reported she was tested positive flu at home treating with Tamiflu, doxycycline and prednisone. Her symptoms were initially improving but worsened on day of admission with more more generalized weakness, fever and not getting out of the bed to the chair. She was admitted for influenza infection and CAP.    Subjective:    [name] Molina was evaluated at the bed side. She feels much better today. She continues to have some cough but denies any fevers or chills.   Assessment  & Plan :    Assessment and Plan: Generalized weakness secondary to influenza infection Influenza A infection - She continues to have a dry cough but feels much stronger today. She remains tachycardic but afebrile. Leukocytosis has been trending down. Still slightly dry on exam. Procalcitonin negative, CXR does not show any infiltrate. Will discontinue antibiotics. -Continue Tamiflu 75 mg twice daily - Continue Mucinex twice daily --Repeat IVNS 1 L bolus today -Continue  supportive care --PT/OT eval and treat   Chronic hypoxic respiratory failure 2 to 4 L oxygen at baseline COPD without acute exacerbation - Cough has improved but she continues to have intermittent dry cough.  No wheezing, rhonchi or rales on exam. --Finish course of prednisone 40 mg -Continue budesonide-glycopyrrolate-formoterol 2 puffs daily and DuoNeb as needed for wheezing shortness of breath.  Continue singular. - Continue supplemental oxygen 2 to 3 L to keep O2 sat above 92%. -  Continue aspiration precaution, continue supportive care.   Paroxysmal atrial fibrillation -Continue Eliquis 5 mg twice daily, Cardizem 240 mg daily in the morning, flecainide 50 mg twice daily and Toprol-XL 50 mg at bedtime.  Hypokalemia K+ of 3.3.  Repleting with oral KCl today. -Follow-up morning BMP and mag  Prediabetes Hyperglycemia Blood glucose of 231 on CMP today. Patient with history of prediabetes with A1c of 5.7% 2 years ago.  Today blood glucose likely due to current steroid exposure. -Start SSI with meals with CBG monitoring -Follow-up repeat A1c   GERD -Continue Protonix.   Hyperlipidemia -Continue pravastatin.   Generalized anxiety disorder -Continue Cymbalta.       Nutrition Problem:        Obesity: Estimated body mass index is 40.24 kg/m as calculated from the following:   Height as of this encounter: 5\' 2"  (1.575 m).   Weight as of this encounter: 99.8 kg.          Condition -good  Family Communication  : No family at bedside  Code Status : Full  Consults  : None  PUD Prophylaxis : Eliquis   Procedures  :     None      Disposition Plan  :    Status is: Observation The patient remains OBS appropriate and will d/c before 2 midnights.  DVT Prophylaxis  :    SCDs Start: 07/17/23 0338 apixaban (ELIQUIS) tablet 5 mg     Lab Results  Component Value Date   PLT 192 07/17/2023    Diet :  Diet Order             Diet Heart Room service  appropriate? Yes; Fluid consistency: Thin  Diet effective now                    Inpatient Medications  Scheduled Meds:  apixaban  5 mg Oral BID   diltiazem  240 mg Oral q morning   DULoxetine  60 mg Oral q morning   flecainide  50 mg Oral Q12H   fluticasone furoate-vilanterol  1 puff Inhalation Daily   And   umeclidinium bromide  1 puff Inhalation Daily   guaiFENesin  600 mg Oral BID   metoprolol succinate  50 mg Oral QHS   montelukast  10 mg Oral QHS   oseltamivir  75 mg Oral BID   pantoprazole  40 mg Oral Daily   potassium chloride  20 mEq Oral Once   potassium chloride  40 mEq Oral Once   pravastatin  40 mg Oral QHS   predniSONE  40 mg Oral Q breakfast   sodium chloride flush  3 mL Intravenous Q12H   sodium chloride flush  3 mL Intravenous Q12H   Continuous Infusions:  sodium chloride     cefTRIAXone (ROCEPHIN)  IV Stopped (07/17/23 0746)   doxycycline (VIBRAMYCIN) IV Stopped (07/17/23 0624)   PRN Meds:.sodium chloride, ibuprofen, ipratropium-albuterol, LORazepam, ondansetron **OR** ondansetron (ZOFRAN) IV, sodium chloride flush  Antibiotics  :    Anti-infectives (From admission, onward)    Start     Dose/Rate Route Frequency Ordered Stop   07/17/23 2200  oseltamivir (TAMIFLU) capsule 75 mg        75 mg Oral 2 times daily 07/17/23 0337 07/22/23 0959   07/17/23 0500  cefTRIAXone (ROCEPHIN) 2 g in sodium chloride 0.9 % 100 mL IVPB        2 g 200 mL/hr over 30 Minutes Intravenous Every 24 hours 07/17/23 0400 07/22/23  4098   07/17/23 0400  doxycycline (VIBRAMYCIN) 100 mg in sodium chloride 0.9 % 250 mL IVPB        100 mg 125 mL/hr over 120 Minutes Intravenous 2 times daily 07/17/23 0350 07/24/23 0959   07/17/23 0330  oseltamivir (TAMIFLU) capsule 75 mg        75 mg Oral  Once 07/17/23 0316 07/17/23 0417         Objective:   Vitals:   07/17/23 0450 07/17/23 0545 07/17/23 0700 07/17/23 0715  BP:   115/74 124/72  Pulse:  (!) 106 (!) 103 (!) 104  Resp:  (!)  24 (!) 23 (!) 26  Temp: 98.5 F (36.9 C)     TempSrc: Oral     SpO2:  98% 99% 99%  Weight:      Height:        Wt Readings from Last 3 Encounters:  07/16/23 99.8 kg  05/29/23 101.2 kg  04/07/23 101.2 kg     Intake/Output Summary (Last 24 hours) at 07/17/2023 0754 Last data filed at 07/17/2023 1191 Gross per 24 hour  Intake 1249 ml  Output --  Net 1249 ml     Physical Exam  General: Pleasant, well-appearing elderly woman laying in bed. No acute distress. HEENT: [place]/AT. Anicteric sclera CV: RRR. No murmurs, rubs, or gallops. No LE edema Pulmonary: Lungs CTAB. Normal effort. No wheezing or rales. Decreased breath sounds at the bases. Abdominal: Soft, NT/ND. Normal bowel sounds. Extremities: Palpable radial and DP pulses. Normal ROM. Skin: Warm and dry. No obvious rash or lesions. Neuro: A&Ox3. Moves all extremities. Normal sensation to light touch. No focal deficit. Psych: Normal mood and affect    RN pressure injury documentation:      Data Review:    Recent Labs  Lab 07/16/23 2350 07/17/23 0532  WBC 15.3* 13.1*  HGB 13.5 12.7  HCT 41.5 40.3  PLT 215 192  MCV 84.3 85.4  MCH 27.4 26.9  MCHC 32.5 31.5  RDW 15.1 15.0  LYMPHSABS 1.0  --   MONOABS 1.3*  --   EOSABS 0.0  --   BASOSABS 0.1  --     Recent Labs  Lab 07/16/23 2350 07/16/23 2351 07/17/23 0532  NA 141  --  139  K 3.6  --  3.3*  CL 102  --  103  CO2 26  --  23  ANIONGAP 13  --  13  GLUCOSE 117*  --  231*  BUN 13  --  13  CREATININE 0.95  --  0.86  AST 16  --  18  ALT 14  --  12  ALKPHOS 59  --  60  BILITOT 1.7*  --  1.9*  ALBUMIN 3.4*  --  3.1*  PROCALCITON  --   --  <0.10  LATICACIDVEN  --  1.1  --   BNP 77.2  --   --   CALCIUM 9.0  --  8.5*      Recent Labs  Lab 07/16/23 2350 07/16/23 2351 07/17/23 0532  PROCALCITON  --   --  <0.10  LATICACIDVEN  --  1.1  --   BNP 77.2  --   --   CALCIUM 9.0  --  8.5*     --------------------------------------------------------------------------------------------------------------- No results found for: "CHOL", "HDL", "LDLCALC", "LDLDIRECT", "TRIG", "CHOLHDL"  Lab Results  Component Value Date   HGBA1C 5.7 (H) 08/15/2020   No results for input(s): "TSH", "T4TOTAL", "FREET4", "T3FREE", "THYROIDAB" in the last 72 hours.  No results for input(s): "VITAMINB12", "FOLATE", "FERRITIN", "TIBC", "IRON", "RETICCTPCT" in the last 72 hours. ------------------------------------------------------------------------------------------------------------------ Cardiac Enzymes No results for input(s): "CKMB", "TROPONINI", "MYOGLOBIN" in the last 168 hours.  Invalid input(s): "CK"  Micro Results Recent Results (from the past 240 hours)  Resp panel by RT-PCR (RSV, Flu A&B, Covid) Anterior Nasal Swab     Status: Abnormal   Collection Time: 07/16/23  9:27 PM   Specimen: Anterior Nasal Swab  Result Value Ref Range Status   SARS Coronavirus 2 by RT PCR NEGATIVE NEGATIVE Final   Influenza A by PCR POSITIVE (A) NEGATIVE Final   Influenza B by PCR NEGATIVE NEGATIVE Final    Comment: (NOTE) The Xpert Xpress SARS-CoV-2/FLU/RSV plus assay is intended as an aid in the diagnosis of influenza from Nasopharyngeal swab specimens and should not be used as a sole basis for treatment. Nasal washings and aspirates are unacceptable for Xpert Xpress SARS-CoV-2/FLU/RSV testing.  Fact Sheet for Patients: BloggerCourse.com  Fact Sheet for Healthcare Providers: SeriousBroker.it  This test is not yet approved or cleared by the [place] FDA and has been authorized for detection and/or diagnosis of SARS-CoV-2 by FDA under an Emergency Use Authorization (EUA). This EUA will remain in effect (meaning this test can be used) for the duration of the COVID-19 declaration under Section 564(b)(1) of the Act, 21 U.S.C. section 360bbb-3(b)(1),  unless the authorization is terminated or revoked.     Resp Syncytial Virus by PCR NEGATIVE NEGATIVE Final    Comment: (NOTE) Fact Sheet for Patients: BloggerCourse.com  Fact Sheet for Healthcare Providers: SeriousBroker.it  This test is not yet approved or cleared by the [place] FDA and has been authorized for detection and/or diagnosis of SARS-CoV-2 by FDA under an Emergency Use Authorization (EUA). This EUA will remain in effect (meaning this test can be used) for the duration of the COVID-19 declaration under Section 564(b)(1) of the Act, 21 U.S.C. section 360bbb-3(b)(1), unless the authorization is terminated or revoked.  Performed at [facility], [address] [place], [place] [address]   Blood culture (routine x 2)     Status: None (Preliminary result)   Collection Time: 07/16/23 11:50 PM   Specimen: BLOOD  Result Value Ref Range Status   Specimen Description BLOOD LEFT ANTECUBITAL  Final   Special Requests   Final    BOTTLES DRAWN AEROBIC AND ANAEROBIC Blood Culture adequate volume   Culture   Final    NO GROWTH < 12 HOURS Performed at [facility], [address], [place], [place] [address]    Report Status PENDING  Incomplete  Blood culture (routine x 2)     Status: None (Preliminary result)   Collection Time: 07/16/23 11:50 PM   Specimen: BLOOD RIGHT HAND  Result Value Ref Range Status   Specimen Description BLOOD RIGHT HAND  Final   Special Requests   Final    BOTTLES DRAWN AEROBIC AND ANAEROBIC Blood Culture adequate volume   Culture   Final    NO GROWTH < 12 HOURS Performed at [facility], [address] [place], [place] [address]    Report Status PENDING  Incomplete    Radiology Reports DG Chest 2 View Result Date: 07/16/2023 CLINICAL DATA:  Shortness of breath EXAM: CHEST - 2 VIEW COMPARISON:  06/27/2021 FINDINGS: Cardiac shadow is stable. Aortic calcifications are noted.  Lungs are clear bilaterally. No focal infiltrate or sizable effusion is seen. No bony abnormality is noted. IMPRESSION: No acute abnormality  seen. Electronically Signed   By: Alcide Clever M.D.   On: 07/16/2023 22:21      Signature  -   Steffanie Rainwater M.D on 07/17/2023 at 7:54 AM   -  To page go to [email or web address]

## 2023-07-17 NOTE — Care Management Obs Status (Deleted)
 MEDICARE OBSERVATION STATUS NOTIFICATION   Patient Details  Name: [name] Molina MRN: [id number] Date of Birth: [birth year]   Medicare Observation Status Notification Given:       Lockie Pares, RN 07/17/2023, 4:37 PM

## 2023-07-17 NOTE — ED Notes (Signed)
 1st lactic 1.08 2nd not needed can be canceled

## 2023-07-17 NOTE — Care Management Obs Status (Signed)
 MEDICARE OBSERVATION STATUS NOTIFICATION   Patient Details  Name: [name] Molina MRN: [id number] Date of Birth: [birth year]   Medicare Observation Status Notification Given:  Yes    Lockie Pares, RN 07/17/2023, 4:38 PM

## 2023-07-17 NOTE — H&P (Signed)
 History and Physical    [name] Molina:[id number] DOB: October 22, 1945 DOA: 07/16/2023  PCP: Laurann Montana, MD   Patient coming from: Home   Chief Complaint:  Chief Complaint  Patient presents with   Weakness   ED TRIAGE note:  Patient bib ems from home for weakness and flu like symptoms. Family in house tested positive for flu and patient was treated with tamiflu, doxycycline, and prednisone. Patient reports symptoms improving but worsening this afternoon. TMAX at home 102.3. 1g tylenol taken PTA.      BP 124/62  HR 110-114 RR 24  SPO2 97% 3L home O2 CBG 182      HPI:  BRITTINEE RISK is a 78 y.o. female with medical history significant of chronic hypoxic respiratory failure due to 4 L oxygen at baseline, COPD, chronic allergic rhinitis, GERD, paroxysmal atrial fibrillation, severe mitral annular calcification, generalized anxiety disorder, and hyperlipidemia presented to emergency department with complaining of generalized weakness and flulike symptoms.  Patient reported she was tested positive flu at home treating with Tamiflu, doxycycline and prednisone. Patient reported that 2 weeks ago her daughter was positive with flu and she was been prescribed prophylactic Tamiflu that she completed the course however today she has been more generalized weakness, fever and not getting out of the bed to the chair.  Patient is complaining about producing productive cough and fever at home.   ED Course:  Presentation to ED patient found tachycardic heart rate 124, respiratory 21 O2 sat 96% to on 3 L and blood pressure 141/59. Normal lactic acid level. Normal lipase level. CMP unremarkable except low albumin 3.4 and elevated total bilirubin 1.7. Normal BNP level. Blood cultures are in process.   CBC showing leukocytosis 15.3 otherwise unremarkable. Respiratory panel positive with influenza A.  Chest x-ray no acute abnormality.  Hospitalist has been contacted for management of generalized  weakness in the setting of influenza A infection.   Significant labs in the ED: Lab Orders         Resp panel by RT-PCR (RSV, Flu A&B, Covid) Anterior Nasal Swab         Blood culture (routine x 2)         Expectorated Sputum Assessment w Gram Stain, Rflx to Resp Cult         CBC with Differential         Urinalysis, w/ Reflex to Culture (Infection Suspected) -Urine, Clean Catch         Brain natriuretic peptide         Comprehensive metabolic panel         Lipase, blood         Comprehensive metabolic panel         CBC         Legionella Pneumophila Serogp 1 Ur Ag         Strep pneumoniae urinary antigen         Procalcitonin       Review of Systems:  Review of Systems  Constitutional:  Positive for fever. Negative for chills, malaise/fatigue and weight loss.  Respiratory:  Positive for cough and sputum production. Negative for shortness of breath and wheezing.   Cardiovascular:  Negative for chest pain.  Gastrointestinal:  Negative for abdominal pain, heartburn, nausea and vomiting.  Musculoskeletal:  Negative for back pain.  Neurological:  Negative for dizziness and headaches.  Psychiatric/Behavioral:  The patient is not nervous/anxious.   All other systems reviewed and are negative.  Past Medical History:  Diagnosis Date   Anxiety    Aortic atherosclerosis (HCC)    Arthritis    Asthma    Atrial tachycardia (HCC)    Cancer (HCC)    basal skin cancer on nose   Cataract    Chronic diastolic CHF (congestive heart failure) (HCC)    Complication of anesthesia    during cervical surgery she woke up during the procedure   COPD (chronic obstructive pulmonary disease) (HCC)    Depression    Diverticulosis    Family history of adverse reaction to anesthesia    mother had post op N&V   GERD (gastroesophageal reflux disease)    History of hiatal hernia    Hyperlipidemia    Hypertension    Insomnia    Macular degeneration    Migraines    Neuropathy    Obesity    PAF  (paroxysmal atrial fibrillation) (HCC)    Pneumonia    Sleep apnea    history of it,not using a cpap, study 2018: didn't need cpap   Thyroid nodule 2000   radioactive caspule    Past Surgical History:  Procedure Laterality Date   ANTERIOR CERVICAL DECOMP/DISCECTOMY FUSION N/A 06/15/2020   Procedure: ANTERIOR CERVICAL DECOMPRESSION FUSION CERVICAL FIVE-CERVICAL SIX WITH INSTRUMENTATION AND ALLOGRAFT;  Surgeon: Estill Bamberg, MD;  Location: [facility] OR;  Service: Orthopedics;  Laterality: N/A;  anterior   CERVICAL POLYPECTOMY     INSIDE AND OUTSIDE   COLONOSCOPY     EYE SURGERY Bilateral    cataracts   POLYPECTOMY     REMOVED CARTLIAGE RIB       reports that she quit smoking about 32 years ago. Her smoking use included cigarettes. She started smoking about 52 years ago. She has a 20 pack-year smoking history. She has never used smokeless tobacco. She reports that she does not drink alcohol and does not use drugs.  Allergies  Allergen Reactions   Food Shortness Of Breath    ALLERGY= MUSHROOMS   Levaquin [Levofloxacin In D5w] Other (See Comments)    "made me want to die."   Tequin Shortness Of Breath   Erythromycin Other (See Comments)    GI UPSET   Oxycodone-Acetaminophen Itching   Codeine Nausea And Vomiting   Penicillins Rash and Other (See Comments)    Reaction occurred during childhood   Prednisone Rash   Wellbutrin [Bupropion Hcl] Other (See Comments)    SHAKING    Family History  Problem Relation Age of Onset   Colon polyps Sister 34       PRECANCEROUS POLYPS   Breast cancer Maternal Grandmother 87   Emphysema Father    Asthma Father    Heart disease Father    Heart disease Paternal Grandfather    Colon cancer Neg Hx     Prior to Admission medications   Medication Sig Start Date End Date Taking? Authorizing Provider  acetaminophen (TYLENOL) 650 MG CR tablet Take 1,300 mg by mouth every 8 (eight) hours as needed for pain.    [provider]  albuterol  (VENTOLIN HFA) 108 (90 Base) MCG/ACT inhaler Inhale 2 puffs into the lungs every 6 (six) hours as needed for wheezing or shortness of breath. 07/04/23   Olalere, Onnie Boer A, MD  ALLERGY RELIEF CETIRIZINE 10 MG tablet TAKE 1 TABLET BY MOUTH ONCE DAILY 02/07/23   Leslye Peer, MD  BESIVANCE 0.6 % SUSP Place 1 drop into the left eye See admin instructions. Instill 1 drop into the  left eye 4 times daily the day OF and day AFTER (eye injection) 09/07/19   [provider]  Budeson-Glycopyrrol-Formoterol (BREZTRI AEROSPHERE) 160-9-4.8 MCG/ACT AERO Inhale 2 puffs into the lungs in the morning and at bedtime. 06/02/23   Leslye Peer, MD  Cholecalciferol 50 MCG (2000 UT) CAPS Take by mouth.    [provider]  diltiazem (CARDIZEM CD) 240 MG 24 hr capsule TAKE 1 CAPSULE BY MOUTH EVERY MORNING 06/09/23   Jake Bathe, MD  doxycycline (VIBRA-TABS) 100 MG tablet Take 1 tablet (100 mg total) by mouth 2 (two) times daily. 07/03/23   Olalere, Minna Antis, MD  DULoxetine (CYMBALTA) 60 MG capsule Take 60 mg by mouth every morning. 03/23/20   [provider]  ELIQUIS 5 MG TABS tablet TAKE ONE TABLET BY MOUTH EVERY MORNING and TAKE ONE TABLET BY MOUTH EVERYDAY AT BEDTIME 11/22/22   Camnitz, Will Daphine Deutscher, MD  flecainide (TAMBOCOR) 50 MG tablet TAKE 1 TABLET BY MOUTH AT [facility] AND AT BEDTIME 05/07/23   Jake Bathe, MD  fluticasone (FLONASE) 50 MCG/ACT nasal spray Place 1 spray into both nostrils daily. 07/19/16   Leslye Peer, MD  furosemide (LASIX) 40 MG tablet TAKE 1 TABLET BY MOUTH EVERY MORNING 06/09/23   Jake Bathe, MD  ipratropium-albuterol (DUONEB) 0.5-2.5 (3) MG/3ML SOLN Take 3 mLs by nebulization every 4 (four) hours as needed (shortness of breath). Patient not taking: Reported on 07/03/2023 05/29/23   Leslye Peer, MD  LORazepam (ATIVAN) 0.5 MG tablet Take 1-2 tablets (0.5-1 mg total) by mouth daily as needed for anxiety. Anxiety 03/15/21   Amin, Ankit C, MD  metoprolol succinate  (TOPROL-XL) 50 MG 24 hr tablet Take 1 tablet (50 mg total) by mouth at bedtime. Take with or immediately following a meal. 05/22/23   Jake Bathe, MD  montelukast (SINGULAIR) 10 MG tablet Take 10 mg by mouth at bedtime. 02/21/14   [provider]  Multiple Vitamins-Minerals (PRESERVISION AREDS 2+MULTI VIT) CAPS Take 1 tablet by mouth 2 (two) times daily.    [provider]  oseltamivir (TAMIFLU) 75 MG capsule Take 1 capsule (75 mg total) by mouth 2 (two) times daily. 07/03/23   Olalere, Onnie Boer A, MD  OXYGEN Inhale 3 L into the lungs continuous.    [provider]  pantoprazole (PROTONIX) 40 MG tablet Take 1 tablet (40 mg total) by mouth daily. 07/11/21   Cobb, Ruby Cola, NP  pravastatin (PRAVACHOL) 40 MG tablet Take 40 mg by mouth at bedtime.  09/04/10   [provider]  predniSONE (DELTASONE) 20 MG tablet Take 1 tablet (20 mg total) by mouth daily with breakfast. 07/03/23   Tomma Lightning, MD     Physical Exam: Vitals:   07/16/23 2101 07/16/23 2103 07/17/23 0201  BP:  115/68 (!) 141/59  Pulse:  (!) 104 (!) 124  Resp:  (!) 22 (!) 21  Temp:  99.8 F (37.7 C) 99.3 F (37.4 C)  TempSrc:  Oral Oral  SpO2:  98% 96%  Weight: 99.8 kg    Height: 5\' 2"  (1.575 m)      Physical Exam Vitals and nursing note reviewed.  Constitutional:      Appearance: She is ill-appearing.  HENT:     Mouth/Throat:     Mouth: Mucous membranes are moist.  Eyes:     Conjunctiva/sclera: Conjunctivae normal.  Cardiovascular:     Rate and Rhythm: Normal rate. Rhythm irregular.     Pulses: Normal pulses.  Heart sounds: Normal heart sounds.  Pulmonary:     Effort: Pulmonary effort is normal. No respiratory distress.     Breath sounds: Rhonchi present. No wheezing or rales.  Abdominal:     General: Bowel sounds are normal.     Palpations: Abdomen is soft.  Musculoskeletal:     Cervical back: Neck supple.     Right lower leg: No edema.     Left lower leg: No edema.   Skin:    General: Skin is warm and dry.     Capillary Refill: Capillary refill takes less than 2 seconds.     Coloration: Skin is not pale.     Findings: No erythema or rash.  Neurological:     Mental Status: She is alert and oriented to person, place, and time.  Psychiatric:        Mood and Affect: Mood normal.      Labs on Admission: I have personally reviewed following labs and imaging studies  CBC: Recent Labs  Lab 07/16/23 2350  WBC 15.3*  NEUTROABS 12.8*  HGB 13.5  HCT 41.5  MCV 84.3  PLT 215   Basic Metabolic Panel: Recent Labs  Lab 07/16/23 2350  NA 141  K 3.6  CL 102  CO2 26  GLUCOSE 117*  BUN 13  CREATININE 0.95  CALCIUM 9.0   GFR: Estimated Creatinine Clearance: 53.1 mL/min (by C-G formula based on SCr of 0.95 mg/dL). Liver Function Tests: Recent Labs  Lab 07/16/23 2350  AST 16  ALT 14  ALKPHOS 59  BILITOT 1.7*  PROT 6.4*  ALBUMIN 3.4*   Recent Labs  Lab 07/16/23 2350  LIPASE 27   No results for input(s): "AMMONIA" in the last 168 hours. Coagulation Profile: No results for input(s): "INR", "PROTIME" in the last 168 hours. Cardiac Enzymes: No results for input(s): "CKTOTAL", "CKMB", "CKMBINDEX", "TROPONINI", "TROPONINIHS" in the last 168 hours. BNP (last 3 results) Recent Labs    07/16/23 2350  BNP 77.2   HbA1C: No results for input(s): "HGBA1C" in the last 72 hours. CBG: No results for input(s): "GLUCAP" in the last 168 hours. Lipid Profile: No results for input(s): "CHOL", "HDL", "LDLCALC", "TRIG", "CHOLHDL", "LDLDIRECT" in the last 72 hours. Thyroid Function Tests: No results for input(s): "TSH", "T4TOTAL", "FREET4", "T3FREE", "THYROIDAB" in the last 72 hours. Anemia Panel: No results for input(s): "VITAMINB12", "FOLATE", "FERRITIN", "TIBC", "IRON", "RETICCTPCT" in the last 72 hours. Urine analysis:    Component Value Date/Time   COLORURINE YELLOW 03/07/2021 1320   APPEARANCEUR CLEAR 03/07/2021 1320   LABSPEC 1.030  03/07/2021 1320   PHURINE 6.0 03/07/2021 1320   GLUCOSEU NEGATIVE 03/07/2021 1320   HGBUR NEGATIVE 03/07/2021 1320   BILIRUBINUR NEGATIVE 03/07/2021 1320   KETONESUR NEGATIVE 03/07/2021 1320   PROTEINUR NEGATIVE 03/07/2021 1320   NITRITE NEGATIVE 03/07/2021 1320   LEUKOCYTESUR MODERATE (A) 03/07/2021 1320    Radiological Exams on Admission: I have personally reviewed images DG Chest 2 View Result Date: 07/16/2023 CLINICAL DATA:  Shortness of breath EXAM: CHEST - 2 VIEW COMPARISON:  06/27/2021 FINDINGS: Cardiac shadow is stable. Aortic calcifications are noted. Lungs are clear bilaterally. No focal infiltrate or sizable effusion is seen. No bony abnormality is noted. IMPRESSION: No acute abnormality seen. Electronically Signed   By: Alcide Clever M.D.   On: 07/16/2023 22:21     EKG: pending EKG    Assessment/Plan: Principal Problem:   Influenzal acute upper respiratory infection Active Problems:   Generalized weakness   CAP (community  acquired pneumonia)   Paroxysmal atrial fibrillation (HCC)   COPD (chronic obstructive pulmonary disease) (HCC)   GERD (gastroesophageal reflux disease)   Hyperlipidemia   Chronic hypoxic respiratory failure (HCC)   Mitral annular calcification 70%   Generalized anxiety disorder    Assessment and Plan: Generalized weakness secondary to influenza infection Influenza A infection -Patient presented emergency department with generalized weakness and having an hide temperature 102 F at home.  Patient reported that 2 weeks ago daughter had an influenza A infection and patient was treated with doxycycline, prednisone and Tamiflu for 5 days course.  However today she became more generalized weak and having poor appetite and developed fever - At presentation to ED patient is hemodynamically stable - Normal lactic acid level.  Respiratory panel positive with influenza A.  CBC showing a leukocytosis 15.3 (reactive leukocytosis in the setting of recent  prednisone course).  CMP unremarkable any evidence of AKI. - Chest x-ray unremarkable for any acute abnormality. - Due to generalized weakness in the setting of influenza A infection patient is not comfortable to discharge to home.  Admitting patient for observation. - Continue Tamiflu 75 mg twice daily -Continue supportive care.   Community-acquired pneumonia - Patient is complaining about fever and having productive cough. - Even though chest x-ray does not show any evidence of pneumonia patient's clinical scenario is CAP. - Starting IV ceftriaxone and IV doxycycline. - Checking sputum culture, urine Legionella urine strep panel.  Blood cultures are pending. - Checking blood procalcitonin level. -Continue supportive care.   Chronic hypoxic respiratory failure 2 to 4 L oxygen at baseline COPD without acute exacerbation - Patient reporting greenish sputum production.  Denies any wheezing and shortness of breath.  Chest x-ray did not showed any evidence of pneumonia.  Patient recently completed course of prednisone and doxycycline.  Given patient has underlying COPD and significant productive cough starting oral prednisone 40 mg for 5 days course. -Continue budesonide-glycopyrrolate-formoterol 2 puffs daily and DuoNeb as needed for wheezing shortness of breath.  Continue singular. - Continue supplemental oxygen 2 to 3 L to keep O2 sat above 92%. -Continue aspiration precaution, continue supportive care.  Paroxysmal atrial fibrillation -Continue Eliquis 5 mg twice daily, Cardizem 240 mg daily in the morning, flecainide 50 mg twice daily and Toprol-XL 50 mg at bedtime.  GERD -Continue Protonix.  Hyperlipidemia -Continue pravastatin.  Generalized anxiety disorder -Continue Cymbalta.  DVT prophylaxis:  SQ Heparin Code Status:  Full Code Diet: Heart healthy diet Family Communication:   Family was present at bedside, at the time of interview. Opportunity was given to ask question and  all questions were answered satisfactorily.  Disposition Plan: Once patient generalized weakness will be more improved can DC home. Consults: None at this time Admission status:   Observation, Telemetry bed  Severity of Illness: The appropriate patient status for this patient is OBSERVATION. Observation status is judged to be reasonable and necessary in order to provide the required intensity of service to ensure the patient's safety. The patient's presenting symptoms, physical exam findings, and initial radiographic and laboratory data in the context of their medical condition is felt to place them at decreased risk for further clinical deterioration. Furthermore, it is anticipated that the patient will be medically stable for discharge from the hospital within 2 midnights of admission.     Tereasa Coop, MD [facility]  How to contact the [facility] Attending or Consulting provider 7A - 7P or covering provider during after hours 7P -7A, for this patient.  Check the care team in [facility] and look for a) attending/consulting [facility] provider listed and b) the [facility] team listed Log into [email or web address] and use [facility] universal password to access. If you do not have the password, please contact the hospital operator. Locate the [facility] provider you are looking for under [facility] and page to a number that you can be directly reached. If you still have difficulty reaching the provider, please page the [facility] (Director on Call) for the Hospitalists listed on amion for assistance.  07/17/2023, 4:32 AM

## 2023-07-17 NOTE — ED Provider Notes (Signed)
  3:13 AM Tested + for flu A.  This is what daughter had a few weeks ago.  She was prescribed prophylactic tamiflu, prednisone, and doxycycline by pulmonology which she completed on Sunday.  Monday felt ok, Tuesday started feeling poorly and much worse yesterday.  She feels achy.  Never felt like that before, just had a bad cough previously.  Will re-start tamiflu.  She is tachycardic here, currently recieiving IVF, seems dry on exam.  She is quite weak but generalized.  Remains on her baseline 3L.  She will require admission.  Discussed with Dr. Janalyn Shy-- will admit for ongoing care.   Garlon Hatchet, PA-C 07/17/23 2956    Sabas Sous, MD 07/17/23 [phone]

## 2023-07-17 NOTE — ED Notes (Signed)
 Pt placed on bed pan for a few mins, declined the urine to urinate. Attempted to straight cath patient; attempt unsuccessful.

## 2023-07-17 NOTE — Care Management (Signed)
 Transition of Care [facility]) - Inpatient Brief Assessment   Patient Details  Name: [name] Molina MRN: [id number] Date of Birth: 02/20/1946  Transition of Care [facility]) CM/SW Contact:    Lockie Pares, RN Phone Number: 07/17/2023, 4:40 PM   Clinical Narrative:  78 year old patient presented with feeling poorly and having trouble " could not walk". She lives with daughter and grandsons who are teenagers. She has been living there for 6 years. She has a rollator at home, no steps that she needs to navigate at home. She has had Home Health on and off since 2022. She has had [facility] and [facility]. She has a church friend that is a PT, she is going to call him for his agency, she believes it was [place], PT evaluation pending, will need follow up to determine disposition.    TOC will follow Transition of Care Asessment: Insurance and Status: Insurance coverage has been reviewed Patient has primary care physician: Yes Home environment has been reviewed: Lives with daughter and teenage grandchildren Prior level of function:: Scientist, clinical (histocompatibility and immunogenetics) Home Services: No current home services Social Drivers of Health Review: SDOH reviewed needs interventions Readmission risk has been reviewed: Yes Transition of care needs: transition of care needs identified, TOC will continue to follow

## 2023-07-18 DIAGNOSIS — F411 Generalized anxiety disorder: Secondary | ICD-10-CM | POA: Diagnosis not present

## 2023-07-18 DIAGNOSIS — J9611 Chronic respiratory failure with hypoxia: Secondary | ICD-10-CM | POA: Diagnosis not present

## 2023-07-18 DIAGNOSIS — I480 Paroxysmal atrial fibrillation: Secondary | ICD-10-CM | POA: Diagnosis not present

## 2023-07-18 DIAGNOSIS — K2100 Gastro-esophageal reflux disease with esophagitis, without bleeding: Secondary | ICD-10-CM | POA: Diagnosis not present

## 2023-07-18 DIAGNOSIS — J101 Influenza due to other identified influenza virus with other respiratory manifestations: Principal | ICD-10-CM

## 2023-07-18 DIAGNOSIS — J438 Other emphysema: Secondary | ICD-10-CM

## 2023-07-18 DIAGNOSIS — L8990 Pressure ulcer of unspecified site, unspecified stage: Secondary | ICD-10-CM | POA: Insufficient documentation

## 2023-07-18 DIAGNOSIS — R531 Weakness: Secondary | ICD-10-CM | POA: Diagnosis not present

## 2023-07-18 LAB — BASIC METABOLIC PANEL
Anion gap: 8 (ref 5–15)
BUN: 15 mg/dL (ref 8–23)
CO2: 27 mmol/L (ref 22–32)
Calcium: 8.7 mg/dL — ABNORMAL LOW (ref 8.9–10.3)
Chloride: 108 mmol/L (ref 98–111)
Creatinine, Ser: 0.89 mg/dL (ref 0.44–1.00)
GFR, Estimated: >60 mL/min (ref >60)
Glucose, Bld: 129 mg/dL — ABNORMAL HIGH (ref 70–99)
Potassium: 4.6 mmol/L (ref 3.5–5.1)
Sodium: 143 mmol/L (ref 135–145)

## 2023-07-18 LAB — CBC
HCT: 35.9 % — ABNORMAL LOW (ref 36.0–46.0)
Hemoglobin: 11.2 g/dL — ABNORMAL LOW (ref 12.0–15.0)
MCH: 27 pg (ref 26.0–34.0)
MCHC: 31.2 g/dL (ref 30.0–36.0)
MCV: 86.5 fL (ref 80.0–100.0)
Platelets: 184 10*3/uL (ref 150–400)
RBC: 4.15 MIL/uL (ref 3.87–5.11)
RDW: 14.9 % (ref 11.5–15.5)
WBC: 8.8 10*3/uL (ref 4.0–10.5)
nRBC: 0 % (ref 0.0–0.2)

## 2023-07-18 LAB — URINE CULTURE: Culture: <10000 — AB

## 2023-07-18 LAB — HEMOGLOBIN A1C
Hgb A1c MFr Bld: 5.5 % (ref 4.8–5.6)
Mean Plasma Glucose: 111.15 mg/dL

## 2023-07-18 LAB — MAGNESIUM: Magnesium: 1.8 mg/dL (ref 1.7–2.4)

## 2023-07-18 LAB — GLUCOSE, CAPILLARY: Glucose-Capillary: 123 mg/dL — ABNORMAL HIGH (ref 70–99)

## 2023-07-18 MED ORDER — OSELTAMIVIR PHOSPHATE 30 MG PO CAPS: 30.0000 mg | ORAL_CAPSULE | Freq: Two times a day (BID) | ORAL | 0 refills | Status: AC

## 2023-07-18 MED ORDER — IPRATROPIUM-ALBUTEROL 0.5-2.5 (3) MG/3ML IN SOLN: 3.0000 mL | RESPIRATORY_TRACT | 3 refills | Status: DC | PRN

## 2023-07-18 MED ORDER — PREDNISONE 20 MG PO TABS: 40.0000 mg | ORAL_TABLET | Freq: Every day | ORAL | 0 refills | Status: AC

## 2023-07-18 NOTE — Plan of Care (Signed)
  Problem: Education: Goal: Knowledge of General Education information will improve Description: Including pain rating scale, medication(s)/side effects and non-pharmacologic comfort measures Outcome: Progressing   Problem: Health Behavior/Discharge Planning: Goal: Ability to manage health-related needs will improve Outcome: Progressing   Problem: Clinical Measurements: Goal: Ability to maintain clinical measurements within normal limits will improve Outcome: Progressing Goal: Will remain free from infection Outcome: Progressing Goal: Diagnostic test results will improve Outcome: Progressing Goal: Respiratory complications will improve Outcome: Progressing Goal: Cardiovascular complication will be avoided Outcome: Progressing   Problem: Activity: Goal: Risk for activity intolerance will decrease Outcome: Progressing   Problem: Nutrition: Goal: Adequate nutrition will be maintained Outcome: Progressing   Problem: Coping: Goal: Level of anxiety will decrease Outcome: Progressing   Problem: Elimination: Goal: Will not experience complications related to bowel motility Outcome: Progressing Goal: Will not experience complications related to urinary retention Outcome: Progressing   Problem: Pain Managment: Goal: General experience of comfort will improve and/or be controlled Outcome: Progressing   Problem: Safety: Goal: Ability to remain free from injury will improve Outcome: Progressing   Problem: Skin Integrity: Goal: Risk for impaired skin integrity will decrease Outcome: Progressing   Problem: Education: Goal: Ability to describe self-care measures that may prevent or decrease complications (Diabetes Survival Skills Education) will improve Outcome: Progressing Goal: Individualized Educational Video(s) Outcome: Progressing   Problem: Coping: Goal: Ability to adjust to condition or change in health will improve Outcome: Progressing   Problem: Fluid  Volume: Goal: Ability to maintain a balanced intake and output will improve Outcome: Progressing   Problem: Health Behavior/Discharge Planning: Goal: Ability to identify and utilize available resources and services will improve Outcome: Progressing Goal: Ability to manage health-related needs will improve Outcome: Progressing   Problem: Metabolic: Goal: Ability to maintain appropriate glucose levels will improve Outcome: Progressing   Problem: Nutritional: Goal: Maintenance of adequate nutrition will improve Outcome: Progressing Goal: Progress toward achieving an optimal weight will improve Outcome: Progressing   Problem: Skin Integrity: Goal: Risk for impaired skin integrity will decrease Outcome: Progressing   Problem: Tissue Perfusion: Goal: Adequacy of tissue perfusion will improve Outcome: Progressing   Problem: Education: Goal: Knowledge of disease or condition will improve Outcome: Progressing Goal: Knowledge of the prescribed therapeutic regimen will improve Outcome: Progressing Goal: Individualized Educational Video(s) Outcome: Progressing   Problem: Activity: Goal: Ability to tolerate increased activity will improve Outcome: Progressing Goal: Will verbalize the importance of balancing activity with adequate rest periods Outcome: Progressing   Problem: Respiratory: Goal: Ability to maintain a clear airway will improve Outcome: Progressing Goal: Levels of oxygenation will improve Outcome: Progressing Goal: Ability to maintain adequate ventilation will improve Outcome: Progressing

## 2023-07-18 NOTE — Evaluation (Signed)
 Physical Therapy Evaluation Patient Details Name: [name] Molina MRN: [id number] DOB: [birth year] Today's Date: 07/18/2023  History of Present Illness  Pt is a 78 y.o. female admitted 2/26 with generalized weakness and flu+. PMH: chronic hypoxic respiratory failure on 3L oxygen at baseline, COPD, chronic allergic rhinitis, GERD, paroxysmal atrial fibrillation, severe mitral annular calcification, generalized anxiety disorder, and hyperlipidemia   Clinical Impression  PT eval complete. Pt required min assist bed mobility, CGA transfers, and CGA amb 15' x 2 with rollator. Mobilized on 3L O2. After amb, SpO2 93% and HR 114. Pt fatigues quickly. She reports familiarity with energy conservation techniques. Pt feels she is at/near her mobility baseline. Declining need for HHPT but is interested in [facility] aide. Pt has all needed home DME. Plan is for d/c home today. PT signing off.        If plan is discharge home, recommend the following: A little help with walking and/or transfers;A little help with bathing/dressing/bathroom;Help with stairs or ramp for entrance;Assist for transportation;Assistance with cooking/housework   Can travel by private vehicle        Equipment Recommendations None recommended by PT  Recommendations for Other Services       Functional Status Assessment Patient has not had a recent decline in their functional status     Precautions / Restrictions Precautions Precautions: Fall Recall of Precautions/Restrictions: Intact      Mobility  Bed Mobility Overal bed mobility: Needs Assistance Bed Mobility: Sit to Supine       Sit to supine: Min assist   General bed mobility comments: assist with BLE    Transfers Overall transfer level: Needs assistance Equipment used: Rollator (4 wheels) Transfers: Sit to/from Stand Sit to Stand: Contact guard assist           General transfer comment: increased time    Ambulation/Gait Ambulation/Gait assistance: Contact  guard assist Gait Distance (Feet): 15 Feet (x 2) Assistive device: Rollator (4 wheels) Gait Pattern/deviations: Steppage, Step-through pattern, Decreased stride length, Wide base of support Gait velocity: decreased     General Gait Details: steppage gait LLE to assist with toe clearance  Stairs            Wheelchair Mobility     Tilt Bed    Modified Rankin (Stroke Patients Only)       Balance Overall balance assessment: Needs assistance Sitting-balance support: No upper extremity supported, Feet supported Sitting balance-Leahy Scale: Fair     Standing balance support: Bilateral upper extremity supported, During functional activity, Reliant on assistive device for balance Standing balance-Leahy Scale: Poor                               Pertinent Vitals/Pain Pain Assessment Pain Assessment: Faces Faces Pain Scale: Hurts a little bit Pain Location: generalized Pain Descriptors / Indicators: Aching, Tender Pain Intervention(s): Monitored during session, Limited activity within patient's tolerance    Home Living Family/patient expects to be discharged to:: Private residence Living Arrangements: Children;Other relatives (daughter and teenage grandsons) Available Help at Discharge: Family;Available PRN/intermittently Type of Home: House Home Access: Stairs to enter   [employer] of Steps: 1 (threshold)   Home Layout: Able to live on main level with bedroom/bathroom Home Equipment: Art gallery manager;Shower seat;Rollator (4 wheels);BSC/3in1;Wheelchair - manual;Grab bars - tub/shower;Transport chair;Cane - single point;Adaptive equipment;Rolling Walker (2 wheels)      Prior Function Prior Level of Function : Needs assist  Mobility Comments: family bumps up/down threshold in wheelchair for home entry exit. Amb very limited household distances with rollator. ADLs Comments: daughter assists with showering     Extremity/Trunk  Assessment   Upper Extremity Assessment Upper Extremity Assessment: Defer to OT evaluation    Lower Extremity Assessment Lower Extremity Assessment: Generalized weakness    Cervical / Trunk Assessment Cervical / Trunk Assessment: Other exceptions Cervical / Trunk Exceptions: body habitus  Communication   Communication Communication: No apparent difficulties    Cognition Arousal: Alert Behavior During Therapy: WFL for tasks assessed/performed   PT - Cognitive impairments: No apparent impairments                         Following commands: Intact       Cueing       General Comments General comments (skin integrity, edema, etc.): Pt on 3L continuous O2. After amb, SpO2 93% and HR 114.    Exercises     Assessment/Plan    PT Assessment Patient does not need any further PT services  PT Problem List         PT Treatment Interventions      PT Goals (Current goals can be found in the Care Plan section)  Acute Rehab PT Goals Patient Stated Goal: home PT Goal Formulation: All assessment and education complete, DC therapy    Frequency       Co-evaluation               AM-PAC PT "6 Clicks" Mobility  Outcome Measure Help needed turning from your back to your side while in a flat bed without using bedrails?: A Little Help needed moving from lying on your back to sitting on the side of a flat bed without using bedrails?: A Little Help needed moving to and from a bed to a chair (including a wheelchair)?: A Little Help needed standing up from a chair using your arms (e.g., wheelchair or bedside chair)?: A Little Help needed to walk in hospital room?: A Little Help needed climbing 3-5 steps with a railing? : Total 6 Click Score: 16    End of Session Equipment Utilized During Treatment: Oxygen Activity Tolerance: Patient limited by fatigue Patient left: in bed;with call bell/phone within reach Nurse Communication: Mobility status PT Visit Diagnosis:  Difficulty in walking, not elsewhere classified (R26.2)    Time: 4782-9562 PT Time Calculation (min) (ACUTE ONLY): 32 min   Charges:   PT Evaluation $PT Eval Moderate Complexity: 1 Mod   PT General Charges $$ ACUTE PT VISIT: 1 Visit         Ferd Glassing., PT  Office # [phone]   Ilda Foil 07/18/2023, 11:02 AM

## 2023-07-18 NOTE — Progress Notes (Signed)
 Patient son and daughter on the phone upset regarding patient's discharge. Patient's son and daughter verbalized "Get the doctor on the phone now! It took 4 people to get her in/out the bed yesterday. It is unsafe at home and I need to speak with doctors before you kick her out!". Family educated regarding current health status, PT/OT recommendations, and provider notified by primary RN.

## 2023-07-18 NOTE — Evaluation (Signed)
 Occupational Therapy Evaluation Patient Details Name: [name] Molina MRN: [id number] DOB: 1945/08/12 Today's Date: 07/18/2023   History of Present Illness   Pt is a 78 y.o. female admitted 2/26 with generalized weakness and flu+. PMH: chronic hypoxic respiratory failure on 3L oxygen at baseline, COPD, chronic allergic rhinitis, GERD, paroxysmal atrial fibrillation, severe mitral annular calcification, generalized anxiety disorder, and hyperlipidemia     Clinical Impressions Pt feels she is near or at baseline level of function with ADLs and ADL mobility, limited by decreased activity tolerance/endurance. Pt is on hokme O2. Pt is currently at set up/Sup level with UB ADLs, min A with LB ADLs, Sup with toileting tasks. Pt mobilized on 3L O2. After amb, SpO2 93% and HR 114. Pt fatigues quickly. She reports familiarity with energy conservation techniques. Declining need for [facility] OT/PT services but is interested in [facility] aide. Pt has all necessary home DME. Plan is for d/c home today. Pt provided with energy conservation handouts, all education completed and no further acute OT services are indicated at this time. OT will sign off       If plan is discharge home, recommend the following:         Functional Status Assessment   Patient has not had a recent decline in their functional status     Equipment Recommendations   None recommended by OT     Recommendations for Other Services         Precautions/Restrictions   Precautions Precautions: Fall Recall of Precautions/Restrictions: Intact Restrictions Weight Bearing Restrictions Per Provider Order: No     Mobility Bed Mobility Overal bed mobility: Needs Assistance Bed Mobility: Sit to Supine       Sit to supine: Min assist   General bed mobility comments: assist with BLE onto bed    Transfers Overall transfer level: Needs assistance Equipment used: (P) Rollator (4 wheels) Transfers: Sit to/from Stand Sit to Stand:  (P) Contact guard assist           General transfer comment: increased time      Balance Overall balance assessment: Needs assistance Sitting-balance support: No upper extremity supported, Feet supported Sitting balance-Leahy Scale: Fair     Standing balance support: Bilateral upper extremity supported, During functional activity, Reliant on assistive device for balance Standing balance-Leahy Scale: Poor                             ADL either performed or assessed with clinical judgement   ADL Overall ADL's : Needs assistance/impaired Eating/Feeding: Independent   Grooming: Wash/dry hands;Wash/dry face;Contact guard assist;Standing   Upper Body Bathing: Set up;Sitting   Lower Body Bathing: Minimal assistance Lower Body Bathing Details (indicate cue type and reason): min A with knees to feet (uses LH bath sponge at home) Upper Body Dressing : Set up;Sitting   Lower Body Dressing: Minimal assistance   Toilet Transfer: Ambulation;Rollator (4 wheels);Contact guard assist;BSC/3in1   Toileting- Clothing Manipulation and Hygiene: Minimal assistance;Sit to/from stand       Functional mobility during ADLs: Contact guard assist;Rollator (4 wheels) General ADL Comments: pt edcuated on energy conservation strategies with handout provided     Vision Baseline Vision/History: 1 Wears glasses Ability to See in Adequate Light: 0 Adequate Patient Visual Report: No change from baseline       Perception         Praxis         Pertinent Vitals/Pain Pain Assessment Pain  Assessment: Faces Faces Pain Scale: Hurts a little bit Pain Location: generalized, LEs with touch Pain Descriptors / Indicators: Aching, Tender Pain Intervention(s): Limited activity within patient's tolerance, Monitored during session, Repositioned     Extremity/Trunk Assessment Upper Extremity Assessment Upper Extremity Assessment: LUE deficits/detail   Lower Extremity Assessment Lower  Extremity Assessment: Defer to PT evaluation   Cervical / Trunk Assessment Cervical / Trunk Assessment: Other exceptions Cervical / Trunk Exceptions: body habitus   Communication Communication Communication: No apparent difficulties   Cognition Arousal: Alert Behavior During Therapy: WFL for tasks assessed/performed Cognition: No apparent impairments                               Following commands: Intact       Cueing  General Comments          Exercises     Shoulder Instructions      Home Living Family/patient expects to be discharged to:: Private residence Living Arrangements: Children;Other relatives Available Help at Discharge: Family;Available PRN/intermittently Type of Home: House Home Access: Stairs to enter [employer] of Steps: 1 (threshold)   Home Layout: Able to live on main level with bedroom/bathroom     Bathroom Shower/Tub: Chief Strategy Officer: Handicapped height Bathroom Accessibility: Yes   Home Equipment: Art gallery manager;Shower seat;Rollator (4 wheels);BSC/3in1;Wheelchair - manual;Grab bars - tub/shower;Transport chair;Cane - single point;Adaptive equipment;Rolling Walker (2 wheels) Adaptive Equipment: Reacher;Long-handled sponge        Prior Functioning/Environment Prior Level of Function : Needs assist             Mobility Comments: family bumps up/down threshold in wheelchair for home entry exit. Amb very limited household distances with rollator. ADLs Comments: daughter assists with showering, pt Ind with ADLs/selfcare    OT Problem List: Impaired balance (sitting and/or standing);Decreased activity tolerance;Obesity   OT Treatment/Interventions:        OT Goals(Current goals can be found in the care plan section)   Acute Rehab OT Goals Patient Stated Goal: go home   OT Frequency:       Co-evaluation              AM-PAC OT "6 Clicks" Daily Activity     Outcome Measure Help  from another person eating meals?: None Help from another person taking care of personal grooming?: A Little Help from another person toileting, which includes using toliet, bedpan, or urinal?: A Little Help from another person bathing (including washing, rinsing, drying)?: A Little Help from another person to put on and taking off regular upper body clothing?: A Little Help from another person to put on and taking off regular lower body clothing?: A Little 6 Click Score: 19   End of Session Equipment Utilized During Treatment: Gait belt;Rolling walker (2 wheels);Other (comment) (BSC)  Activity Tolerance: Patient tolerated treatment well;Patient limited by fatigue Patient left: in bed;with call bell/phone within reach  OT Visit Diagnosis: Other abnormalities of gait and mobility (R26.89);Muscle weakness (generalized) (M62.81)                Time: 1610-9604 OT Time Calculation (min): 44 min Charges:  OT General Charges $OT Visit: 1 Visit OT Evaluation $OT Eval Moderate Complexity: 1 Mod OT Treatments $Self Care/Home Management : 8-22 mins    Galen Manila 07/18/2023, 1:35 PM

## 2023-07-18 NOTE — Consult Note (Signed)
 [facility] Liaison Consult Note    07/18/2023  [name] Molina Jan 04, 1946 [id number]  [facility] Patient:  Active with [facility] team  Primary Care Provider:  Laurann Montana, MD, with Deboraha Sprang at [facility] provider is listed to provide the community transition of care follow up   Insurance: Medicare ACO REACH  Patient is currently active with [facility] for care coordination services.  Patient has been engaged by a [employer] or Psychologist, educational.  The community based plan of care has focused on disease management and community resource support.  Spoke with patient via phone, patient endorses PCP and follow up with [facility] RN. Explained reason for call and anticipated HH noted and per inpatient [facility] RN.  Patient will receive a post hospital call and will be evaluated for assessments and disease process education.  Anticipate an appointment with [facility] RN follow up call for care coordination and complex care management needs.  Plan: Contacted [facility] RN and Child psychotherapist for ongoing follow up.  Patient was in OBS status noted.  Of note, [facility] services does not replace or interfere with any services that are needed or arranged by inpatient [facility] care management team.   Charlesetta Shanks, RN, BSN, CCM [place]  [facility], [facility] Liaison Direct Dial: [phone] or secure chat Email: [email or web address]

## 2023-07-18 NOTE — Discharge Summary (Addendum)
 Physician Discharge Summary   Patient: [name] Molina MRN: [id number] DOB: 20-Aug-1945  Admit date:     07/16/2023  Discharge date: 07/18/23  Discharge Physician: Arnetha Courser   PCP: Laurann Montana, MD   Recommendations at discharge:  Please obtain CBC and BMP on follow-up Follow-up with primary care provider within a week  Discharge Diagnoses: Principal Problem:   Influenzal acute upper respiratory infection Active Problems:   Generalized weakness   CAP (community acquired pneumonia)   Paroxysmal atrial fibrillation (HCC)   COPD (chronic obstructive pulmonary disease) (HCC)   GERD (gastroesophageal reflux disease)   Hyperlipidemia   Chronic hypoxic respiratory failure (HCC)   Mitral annular calcification 70%   Generalized anxiety disorder   Influenza A   Pressure injury of skin  Resolved Problems:   * No resolved hospital problems. *  Hospital Course: [name] Molina is a 78 y.o. female with medical history significant of chronic hypoxic respiratory failure due to 4 L oxygen at baseline, COPD, chronic allergic rhinitis, GERD, paroxysmal atrial fibrillation, severe mitral annular calcification, generalized anxiety disorder, and hyperlipidemia who presented to emergency department with complaining of generalized weakness and flulike symptoms.  Patient reported she was tested positive flu at home treating with Tamiflu, doxycycline and prednisone. Her symptoms were initially improving but worsened on day of admission with more more generalized weakness, fever and not getting out of the bed to the chair.   She likely had mild exacerbation of COPD secondary to influenza A infection.  She was given Tamiflu and supportive care.  Chest x-ray without any acute abnormalities or pneumonia ruled out.Procalcitonin negative  Patient had mildly elevated CBG likely due to steroid use, repeat A1c was 5.5.  Patient had mild hypokalemia which was repleted.  PT and OT evaluation was obtained.  Patient  is at baseline and does not want to participate with home PT. daughter was concerned that patient with progressive weakness and unable to walk with the help of walker.  Per patient she uses wheelchair and has not been walking for a while.  She does not want to go to any facility or participate with home health physical therapy.  Per patient she is just at her baseline in terms of mobility.  Patient will continue on current medications and need to have a close follow-up with her providers for further recommendations.  Patient is high risk for readmission or fall.  She does not want to participate with physical therapy.   Consultants: None Procedures performed: None Disposition: Home Diet recommendation:  Discharge Diet Orders (From admission, onward)     Start     Ordered   07/18/23 0000  Diet - low sodium heart healthy        07/18/23 1046           Cardiac and Carb modified diet DISCHARGE MEDICATION: Allergies as of 07/18/2023       Reactions   Food Shortness Of Breath   ALLERGY= MUSHROOMS   Levaquin [levofloxacin In D5w] Other (See Comments)   "made me want to die."   Tequin Shortness Of Breath   Erythromycin Other (See Comments)   GI UPSET   Oxycodone-acetaminophen Itching   Codeine Nausea And Vomiting   Penicillins Rash, Other (See Comments)   Reaction occurred during childhood   Prednisone Rash   Wellbutrin [bupropion Hcl] Other (See Comments)   SHAKING        Medication List     STOP taking these medications    doxycycline 100  MG tablet Commonly known as: VIBRA-TABS       TAKE these medications    acetaminophen 650 MG CR tablet Commonly known as: TYLENOL Take 1,300 mg by mouth daily as needed for pain.   albuterol 108 (90 Base) MCG/ACT inhaler Commonly known as: VENTOLIN HFA Inhale 2 puffs into the lungs every 6 (six) hours as needed for wheezing or shortness of breath.   Allergy Relief Cetirizine 10 MG tablet Generic drug: cetirizine TAKE 1  TABLET BY MOUTH ONCE DAILY   Besivance 0.6 % Susp Generic drug: Besifloxacin HCl Place 1 drop into the left eye See admin instructions. Instill 1 drop into the left eye 4 times daily the day OF and day AFTER (eye injection)   Breztri Aerosphere 160-9-4.8 MCG/ACT Aero Generic drug: Budeson-Glycopyrrol-Formoterol Inhale 2 puffs into the lungs in the morning and at bedtime.   Cholecalciferol 50 MCG (2000 UT) Caps Take 2,000 Units by mouth daily.   diltiazem 240 MG 24 hr capsule Commonly known as: CARDIZEM CD TAKE 1 CAPSULE BY MOUTH EVERY MORNING   DULoxetine 60 MG capsule Commonly known as: CYMBALTA Take 60 mg by mouth every morning.   Eliquis 5 MG Tabs tablet Generic drug: apixaban TAKE ONE TABLET BY MOUTH EVERY MORNING and TAKE ONE TABLET BY MOUTH EVERYDAY AT BEDTIME   flecainide 50 MG tablet Commonly known as: TAMBOCOR TAKE 1 TABLET BY MOUTH AT BREAKFAST AND AT BEDTIME   fluticasone 50 MCG/ACT nasal spray Commonly known as: FLONASE Place 1 spray into both nostrils daily.   furosemide 40 MG tablet Commonly known as: LASIX TAKE 1 TABLET BY MOUTH EVERY MORNING   ipratropium-albuterol 0.5-2.5 (3) MG/3ML Soln Commonly known as: DUONEB Take 3 mLs by nebulization every 4 (four) hours as needed (shortness of breath).   LORazepam 0.5 MG tablet Commonly known as: ATIVAN Take 1-2 tablets (0.5-1 mg total) by mouth daily as needed for anxiety. Anxiety   metoprolol succinate 50 MG 24 hr tablet Commonly known as: TOPROL-XL Take 1 tablet (50 mg total) by mouth at bedtime. Take with or immediately following a meal. What changed: when to take this   montelukast 10 MG tablet Commonly known as: SINGULAIR Take 10 mg by mouth at bedtime.   oseltamivir 30 MG capsule Commonly known as: TAMIFLU Take 1 capsule (30 mg total) by mouth 2 (two) times daily for 7 doses. What changed:  medication strength how much to take   OXYGEN Inhale 3 L into the lungs continuous.   pantoprazole  40 MG tablet Commonly known as: Protonix Take 1 tablet (40 mg total) by mouth daily.   pravastatin 40 MG tablet Commonly known as: PRAVACHOL Take 40 mg by mouth at bedtime.   predniSONE 20 MG tablet Commonly known as: DELTASONE Take 2 tablets (40 mg total) by mouth daily with breakfast for 3 days. Start taking on: July 19, 2023 What changed: how much to take   PreserVision AREDS 2+Multi Vit Caps Take 1 tablet by mouth 2 (two) times daily.               Discharge Care Instructions  (From admission, onward)           Start     Ordered   07/18/23 0000  Discharge wound care:       Comments: Apply gel dressing as needed and change position every 2 hour   07/18/23 1046            Follow-up Information     Laurann Montana,  MD. Schedule an appointment as soon as possible for a visit in 1 week(s).   Specialty: Family Medicine Contact information: [address], [address] [place] [address] [phone]                Discharge Exam: Ceasar Mons Weights   07/16/23 2101 07/17/23 1533 07/18/23 0446  Weight: 99.8 kg 97 kg 98.8 kg   General.  Obese lady, in no acute distress. Pulmonary.  Lungs clear bilaterally, normal respiratory effort. CV.  Regular rate and rhythm, no JVD, rub or murmur. Abdomen.  Soft, nontender, nondistended, BS positive. CNS.  Alert and oriented .  No focal neurologic deficit. Extremities.  No edema, no cyanosis, pulses intact and symmetrical. Psychiatry.  Judgment and insight appears normal.   Condition at discharge: stable  The results of significant diagnostics from this hospitalization (including imaging, microbiology, ancillary and laboratory) are listed below for reference.   Imaging Studies: DG Chest 2 View Result Date: 07/16/2023 CLINICAL DATA:  Shortness of breath EXAM: CHEST - 2 VIEW COMPARISON:  06/27/2021 FINDINGS: Cardiac shadow is stable. Aortic calcifications are noted. Lungs are clear bilaterally. No focal  infiltrate or sizable effusion is seen. No bony abnormality is noted. IMPRESSION: No acute abnormality seen. Electronically Signed   By: Alcide Clever M.D.   On: 07/16/2023 22:21    Microbiology: Results for orders placed or performed during the hospital encounter of 07/16/23  Resp panel by RT-PCR (RSV, Flu A&B, Covid) Anterior Nasal Swab     Status: Abnormal   Collection Time: 07/16/23  9:27 PM   Specimen: Anterior Nasal Swab  Result Value Ref Range Status   SARS Coronavirus 2 by RT PCR NEGATIVE NEGATIVE Final   Influenza A by PCR POSITIVE (A) NEGATIVE Final   Influenza B by PCR NEGATIVE NEGATIVE Final    Comment: (NOTE) The Xpert Xpress SARS-CoV-2/FLU/RSV plus assay is intended as an aid in the diagnosis of influenza from Nasopharyngeal swab specimens and should not be used as a sole basis for treatment. Nasal washings and aspirates are unacceptable for Xpert Xpress SARS-CoV-2/FLU/RSV testing.  Fact Sheet for Patients: BloggerCourse.com  Fact Sheet for Healthcare Providers: SeriousBroker.it  This test is not yet approved or cleared by the [place] FDA and has been authorized for detection and/or diagnosis of SARS-CoV-2 by FDA under an Emergency Use Authorization (EUA). This EUA will remain in effect (meaning this test can be used) for the duration of the COVID-19 declaration under Section 564(b)(1) of the Act, 21 U.S.C. section 360bbb-3(b)(1), unless the authorization is terminated or revoked.     Resp Syncytial Virus by PCR NEGATIVE NEGATIVE Final    Comment: (NOTE) Fact Sheet for Patients: BloggerCourse.com  Fact Sheet for Healthcare Providers: SeriousBroker.it  This test is not yet approved or cleared by the [place] FDA and has been authorized for detection and/or diagnosis of SARS-CoV-2 by FDA under an Emergency Use Authorization (EUA). This EUA will remain in  effect (meaning this test can be used) for the duration of the COVID-19 declaration under Section 564(b)(1) of the Act, 21 U.S.C. section 360bbb-3(b)(1), unless the authorization is terminated or revoked.  Performed at [facility], [address] [place], [place] [address]   Blood culture (routine x 2)     Status: None (Preliminary result)   Collection Time: 07/16/23 11:50 PM   Specimen: BLOOD  Result Value Ref Range Status   Specimen Description BLOOD LEFT ANTECUBITAL  Final   Special Requests   Final  BOTTLES DRAWN AEROBIC AND ANAEROBIC Blood Culture adequate volume   Culture   Final    NO GROWTH 1 DAY Performed at [facility], [address] [place], [place] [address]    Report Status PENDING  Incomplete  Blood culture (routine x 2)     Status: None (Preliminary result)   Collection Time: 07/16/23 11:50 PM   Specimen: BLOOD RIGHT HAND  Result Value Ref Range Status   Specimen Description BLOOD RIGHT HAND  Final   Special Requests   Final    BOTTLES DRAWN AEROBIC AND ANAEROBIC Blood Culture adequate volume   Culture   Final    NO GROWTH 1 DAY Performed at [facility], [address] [place], [place] [address]    Report Status PENDING  Incomplete    Labs: CBC: Recent Labs  Lab 07/16/23 2350 07/17/23 0532 07/18/23 0226  WBC 15.3* 13.1* 8.8  NEUTROABS 12.8*  --   --   HGB 13.5 12.7 11.2*  HCT 41.5 40.3 35.9*  MCV 84.3 85.4 86.5  PLT 215 192 184   Basic Metabolic Panel: Recent Labs  Lab 07/16/23 2350 07/17/23 0532 07/18/23 0226  NA 141 139 143  K 3.6 3.3* 4.6  CL 102 103 108  CO2 26 23 27   GLUCOSE 117* 231* 129*  BUN 13 13 15   CREATININE 0.95 0.86 0.89  CALCIUM 9.0 8.5* 8.7*  MG  --   --  1.8   Liver Function Tests: Recent Labs  Lab 07/16/23 2350 07/17/23 0532  AST 16 18  ALT 14 12  ALKPHOS 59 60  BILITOT 1.7* 1.9*  PROT 6.4* 6.2*  ALBUMIN 3.4* 3.1*   CBG: Recent Labs  Lab 07/17/23 1704 07/17/23 2112  07/18/23 0508  GLUCAP 139* 122* 123*    Discharge time spent: greater than 30 minutes.  This record has been created using Conservation officer, historic buildings. Errors have been sought and corrected,but may not always be located. Such creation errors do not reflect on the standard of care.   Signed: Arnetha Courser, MD [facility] 07/18/2023

## 2023-07-18 NOTE — TOC Progression Note (Addendum)
 Transition of Care [facility]) - Progression Note    Patient Details  Name: [name] Molina MRN: [id number] Date of Birth: 1946-01-11  Transition of Care [facility]) CM/SW Contact  Leone Haven, RN Phone Number: 07/18/2023, 10:55 AM  Clinical Narrative:    NCM spoke with patient, offered choice, for Home Health she chose [facility],  NCM left vm for Amy awaiting to hear back. HH for HHRN, [facility].  Amy with [facility] states they will take the referral.  Soc will begin 24 to 48 hrs post dc.  She has transport at dc.        Expected Discharge Plan and Services         Expected Discharge Date: 07/18/23                                     Social Determinants of Health (SDOH) Interventions SDOH Screenings   Food Insecurity: No Food Insecurity (07/17/2023)  Housing: Low Risk  (07/17/2023)  Transportation Needs: No Transportation Needs (07/17/2023)  Utilities: Not At Risk (07/17/2023)  Depression (PHQ2-9): Low Risk  (04/23/2021)  Social Connections: Socially Isolated (07/17/2023)  Tobacco Use: Medium Risk (07/17/2023)    Readmission Risk Interventions     No data to display

## 2023-07-22 ENCOUNTER — Other Ambulatory Visit (HOSPITAL_BASED_OUTPATIENT_CLINIC_OR_DEPARTMENT_OTHER): Payer: Self-pay

## 2023-07-22 ENCOUNTER — Encounter: Payer: Self-pay | Admitting: Emergency Medicine

## 2023-07-22 LAB — CULTURE, BLOOD (ROUTINE X 2)
Culture: NO GROWTH
Culture: NO GROWTH
Special Requests: ADEQUATE
Special Requests: ADEQUATE

## 2023-07-22 MED ORDER — IPRATROPIUM-ALBUTEROL 0.5-2.5 (3) MG/3ML IN SOLN: 3.0000 mL | RESPIRATORY_TRACT | 3 refills | Status: AC | PRN

## 2023-07-23 ENCOUNTER — Other Ambulatory Visit (HOSPITAL_COMMUNITY): Payer: Self-pay

## 2023-07-23 ENCOUNTER — Telehealth: Payer: Self-pay

## 2023-07-23 ENCOUNTER — Ambulatory Visit: Payer: Self-pay

## 2023-07-23 NOTE — Patient Outreach (Signed)
 Care Coordination   Follow Up Visit Note   07/23/2023 Name: [name] Molina MRN: [id number] DOB: Nov 20, 1945  [name] Molina is a 78 y.o. year old female who sees Laurann Montana, MD for primary care. I spoke with  Franne Forts by phone today.  What matters to the patients health and wellness today?     I spoke with [name] Molina, who had been hospitalized due to influenza. She reported that she has completed both rounds of her prescribed medication and is experiencing improvements in her condition, although she continues to have a persistent cough. [name] Molina is being treated with Prednisone and cough syrup, which she finds helpful. She mentioned that last night was her most restful period, as she could sleep the entire night. She receives three liters of oxygen and has noticed a slight swelling in her ankles. Despite this, she believes her overall condition is improving.   SDOH assessments and interventions completed:  No     Care Coordination Interventions:  Yes, provided        Interventions Today    Flowsheet Row Most Recent Value  Chronic Disease   Chronic disease during today's visit Chronic Obstructive Pulmonary Disease (COPD)  General Interventions   General Interventions Discussed/Reviewed General Interventions Discussed, General Interventions Reviewed  Pharmacy Interventions   Pharmacy Dicussed/Reviewed Pharmacy Topics Discussed  Safety Interventions   Safety Discussed/Reviewed Safety Discussed        Follow up plan: Follow up call scheduled for 09/09/23  2 pm    Encounter Outcome:  Patient Visit Completed   Juanell Fairly RN, BSN, [facility]  [facility], [facility]  Care Coordinator Phone: [phone]

## 2023-07-23 NOTE — Telephone Encounter (Signed)
 Pharmacy Patient Advocate Encounter   Received notification from CoverMyMeds that prior authorization for Ipratropium-Albuterol 0.5-2.5 (3)MG/3ML solution is required/requested.   Insurance verification completed.   The patient is insured through [facility] .   Per test claim: PA required; PA submitted to above mentioned insurance via CoverMyMeds Key/confirmation #/EOC [id number] Status is pending

## 2023-07-23 NOTE — Patient Instructions (Signed)
 Visit Information  Thank you for taking time to visit with me today. Please don't hesitate to contact me if I can be of assistance to you.   Following are the goals we discussed today:   Goals Addressed             This Visit's Progress    To monitor and manage my COPD and FALLS       Patient Goals/Self Care Activities: -Patient/Caregiver will take medications as prescribed   -Patient/Caregiver will attend all scheduled provider appointments -Patient/Caregiver will call pharmacy for medication refills 3-7 days in advance of running out of medications -Patient/Caregiver will call provider office for new concerns or questions  -Patient/Caregiver will focus on medication adherence by taking medications as prescribed   -always check your lines to make sure they are free from holes -Keep your airway clear from mucus build up  -Self assess COPD action plan zone and make appointment with provider if you have been in the yellow zone for 48 hours without improvement -continue to check your O2sat and oxygen hose from the cats -take your fluid pills -Wear your mask when going out  Wear your mask around sick family members Continue with your prednisone and cough syrup for cough Get rest          Our next appointment is by telephone on 09/09/23 at 2 pm  Please call the care guide team at [phone] if you need to cancel or reschedule your appointment.   If you are experiencing a Mental Health or Behavioral Health Crisis or need someone to talk to, please call 1-800-273-TALK (toll free, 24 hour hotline)  Patient verbalizes understanding of instructions and care plan provided today and agrees to view in MyChart. Active MyChart status and patient understanding of how to access instructions and care plan via MyChart confirmed with patient.     Juanell Fairly RN, BSN, [facility]  [facility], [facility]  Care Coordinator Phone: [phone]

## 2023-07-24 NOTE — Telephone Encounter (Signed)
 Pharmacy Patient Advocate Encounter  Received notification from [facility] Medicare that Prior Authorization for  Ipratropium-Albuterol 0.5-2.5 (3)MG/3ML solution has been DENIED.  Full denial letter will be uploaded to the media tab. See denial reason below.  Drugs used with DME in the home are covered under Medicare Part B. Our records show that you do not live in a long term care (LTC) facility. We cannot pay for drugs under Medicare Part D if they are covered until Medicare Part A or B. We did not decide whether IPRATROPIUM-ALBUTEROL Solution is medically necessary. We made our decision only on the the fact that we cannot pay for the drug under Medicare Part D. For more information, talk to your prescriber or call 1-800-MEDICARE  PA #/Case ID/Reference #: [id number]

## 2023-07-25 DIAGNOSIS — Z7901 Long term (current) use of anticoagulants: Secondary | ICD-10-CM | POA: Diagnosis not present

## 2023-07-25 DIAGNOSIS — I058 Other rheumatic mitral valve diseases: Secondary | ICD-10-CM | POA: Diagnosis not present

## 2023-07-25 DIAGNOSIS — K219 Gastro-esophageal reflux disease without esophagitis: Secondary | ICD-10-CM | POA: Diagnosis not present

## 2023-07-25 DIAGNOSIS — Z9981 Dependence on supplemental oxygen: Secondary | ICD-10-CM | POA: Diagnosis not present

## 2023-07-25 DIAGNOSIS — J309 Allergic rhinitis, unspecified: Secondary | ICD-10-CM | POA: Diagnosis not present

## 2023-07-25 DIAGNOSIS — E669 Obesity, unspecified: Secondary | ICD-10-CM | POA: Diagnosis not present

## 2023-07-25 DIAGNOSIS — I509 Heart failure, unspecified: Secondary | ICD-10-CM | POA: Diagnosis not present

## 2023-07-25 DIAGNOSIS — J101 Influenza due to other identified influenza virus with other respiratory manifestations: Secondary | ICD-10-CM | POA: Diagnosis not present

## 2023-07-25 DIAGNOSIS — J440 Chronic obstructive pulmonary disease with acute lower respiratory infection: Secondary | ICD-10-CM | POA: Diagnosis not present

## 2023-07-25 DIAGNOSIS — J9611 Chronic respiratory failure with hypoxia: Secondary | ICD-10-CM | POA: Diagnosis not present

## 2023-07-25 DIAGNOSIS — E876 Hypokalemia: Secondary | ICD-10-CM | POA: Diagnosis not present

## 2023-07-25 DIAGNOSIS — I480 Paroxysmal atrial fibrillation: Secondary | ICD-10-CM | POA: Diagnosis not present

## 2023-07-25 DIAGNOSIS — Z6838 Body mass index (BMI) 38.0-38.9, adult: Secondary | ICD-10-CM | POA: Diagnosis not present

## 2023-07-25 DIAGNOSIS — Z7951 Long term (current) use of inhaled steroids: Secondary | ICD-10-CM | POA: Diagnosis not present

## 2023-07-25 DIAGNOSIS — Z79899 Other long term (current) drug therapy: Secondary | ICD-10-CM | POA: Diagnosis not present

## 2023-07-25 DIAGNOSIS — Z604 Social exclusion and rejection: Secondary | ICD-10-CM | POA: Diagnosis not present

## 2023-07-25 DIAGNOSIS — E785 Hyperlipidemia, unspecified: Secondary | ICD-10-CM | POA: Diagnosis not present

## 2023-07-25 DIAGNOSIS — F411 Generalized anxiety disorder: Secondary | ICD-10-CM | POA: Diagnosis not present

## 2023-07-25 DIAGNOSIS — J1000 Influenza due to other identified influenza virus with unspecified type of pneumonia: Secondary | ICD-10-CM | POA: Diagnosis not present

## 2023-07-29 DIAGNOSIS — R7301 Impaired fasting glucose: Secondary | ICD-10-CM | POA: Diagnosis not present

## 2023-07-29 DIAGNOSIS — J9611 Chronic respiratory failure with hypoxia: Secondary | ICD-10-CM | POA: Diagnosis not present

## 2023-07-29 DIAGNOSIS — I10 Essential (primary) hypertension: Secondary | ICD-10-CM | POA: Diagnosis not present

## 2023-07-29 DIAGNOSIS — D509 Iron deficiency anemia, unspecified: Secondary | ICD-10-CM | POA: Diagnosis not present

## 2023-07-29 DIAGNOSIS — I5032 Chronic diastolic (congestive) heart failure: Secondary | ICD-10-CM | POA: Diagnosis not present

## 2023-07-29 DIAGNOSIS — R058 Other specified cough: Secondary | ICD-10-CM | POA: Diagnosis not present

## 2023-07-29 DIAGNOSIS — Z8709 Personal history of other diseases of the respiratory system: Secondary | ICD-10-CM | POA: Diagnosis not present

## 2023-07-30 DIAGNOSIS — J9611 Chronic respiratory failure with hypoxia: Secondary | ICD-10-CM | POA: Diagnosis not present

## 2023-07-30 DIAGNOSIS — I480 Paroxysmal atrial fibrillation: Secondary | ICD-10-CM | POA: Diagnosis not present

## 2023-07-30 DIAGNOSIS — J440 Chronic obstructive pulmonary disease with acute lower respiratory infection: Secondary | ICD-10-CM | POA: Diagnosis not present

## 2023-07-30 DIAGNOSIS — J101 Influenza due to other identified influenza virus with other respiratory manifestations: Secondary | ICD-10-CM | POA: Diagnosis not present

## 2023-07-30 DIAGNOSIS — J1000 Influenza due to other identified influenza virus with unspecified type of pneumonia: Secondary | ICD-10-CM | POA: Diagnosis not present

## 2023-07-30 DIAGNOSIS — I509 Heart failure, unspecified: Secondary | ICD-10-CM | POA: Diagnosis not present

## 2023-07-31 NOTE — Telephone Encounter (Signed)
 I called and spoke to pt. Pt states she did pick up her ipratropium albuterol from her pharmacy. Pt states she has also been sob lately and has went up to 4l of 02 instead of 3l. Pt is still taking Breztri as rx, uses her albuterol rescue inhaler, and still takes her neb treatments.

## 2023-08-01 ENCOUNTER — Encounter: Payer: Self-pay | Admitting: Cardiology

## 2023-08-01 NOTE — Telephone Encounter (Signed)
 Left message for pt to call.

## 2023-08-05 ENCOUNTER — Encounter: Payer: Self-pay | Admitting: Cardiology

## 2023-08-05 ENCOUNTER — Other Ambulatory Visit: Payer: Self-pay | Admitting: *Deleted

## 2023-08-05 MED ORDER — APIXABAN 5 MG PO TABS: 5.0000 mg | ORAL_TABLET | Freq: Two times a day (BID) | ORAL | 1 refills | Status: DC

## 2023-08-05 NOTE — Telephone Encounter (Signed)
 Pt send a second advise request and has not returned the call.  Attempted to call pt again to discuss her concerns as there is too much to review through MyChart.  I replied to the 2nd advise request.  Offered her an appt with Dr Anne Fu on Thursday and requested she call back to discuss her concerns.  Please see that note for further documentation.

## 2023-08-08 DIAGNOSIS — J9611 Chronic respiratory failure with hypoxia: Secondary | ICD-10-CM | POA: Diagnosis not present

## 2023-08-08 DIAGNOSIS — J440 Chronic obstructive pulmonary disease with acute lower respiratory infection: Secondary | ICD-10-CM | POA: Diagnosis not present

## 2023-08-08 DIAGNOSIS — J1000 Influenza due to other identified influenza virus with unspecified type of pneumonia: Secondary | ICD-10-CM | POA: Diagnosis not present

## 2023-08-08 DIAGNOSIS — I480 Paroxysmal atrial fibrillation: Secondary | ICD-10-CM | POA: Diagnosis not present

## 2023-08-08 DIAGNOSIS — J101 Influenza due to other identified influenza virus with other respiratory manifestations: Secondary | ICD-10-CM | POA: Diagnosis not present

## 2023-08-08 DIAGNOSIS — I509 Heart failure, unspecified: Secondary | ICD-10-CM | POA: Diagnosis not present

## 2023-08-12 DIAGNOSIS — J101 Influenza due to other identified influenza virus with other respiratory manifestations: Secondary | ICD-10-CM | POA: Diagnosis not present

## 2023-08-12 DIAGNOSIS — J440 Chronic obstructive pulmonary disease with acute lower respiratory infection: Secondary | ICD-10-CM | POA: Diagnosis not present

## 2023-08-12 DIAGNOSIS — I480 Paroxysmal atrial fibrillation: Secondary | ICD-10-CM | POA: Diagnosis not present

## 2023-08-12 DIAGNOSIS — I509 Heart failure, unspecified: Secondary | ICD-10-CM | POA: Diagnosis not present

## 2023-08-12 DIAGNOSIS — J9611 Chronic respiratory failure with hypoxia: Secondary | ICD-10-CM | POA: Diagnosis not present

## 2023-08-12 DIAGNOSIS — J1000 Influenza due to other identified influenza virus with unspecified type of pneumonia: Secondary | ICD-10-CM | POA: Diagnosis not present

## 2023-08-20 DIAGNOSIS — J9611 Chronic respiratory failure with hypoxia: Secondary | ICD-10-CM | POA: Diagnosis not present

## 2023-08-20 DIAGNOSIS — I480 Paroxysmal atrial fibrillation: Secondary | ICD-10-CM | POA: Diagnosis not present

## 2023-08-20 DIAGNOSIS — I10 Essential (primary) hypertension: Secondary | ICD-10-CM | POA: Diagnosis not present

## 2023-08-20 DIAGNOSIS — J449 Chronic obstructive pulmonary disease, unspecified: Secondary | ICD-10-CM | POA: Diagnosis not present

## 2023-08-20 DIAGNOSIS — I5032 Chronic diastolic (congestive) heart failure: Secondary | ICD-10-CM | POA: Diagnosis not present

## 2023-08-21 DIAGNOSIS — J9611 Chronic respiratory failure with hypoxia: Secondary | ICD-10-CM | POA: Diagnosis not present

## 2023-08-21 DIAGNOSIS — I480 Paroxysmal atrial fibrillation: Secondary | ICD-10-CM | POA: Diagnosis not present

## 2023-08-21 DIAGNOSIS — I509 Heart failure, unspecified: Secondary | ICD-10-CM | POA: Diagnosis not present

## 2023-08-21 DIAGNOSIS — J101 Influenza due to other identified influenza virus with other respiratory manifestations: Secondary | ICD-10-CM | POA: Diagnosis not present

## 2023-08-21 DIAGNOSIS — J1000 Influenza due to other identified influenza virus with unspecified type of pneumonia: Secondary | ICD-10-CM | POA: Diagnosis not present

## 2023-08-21 DIAGNOSIS — J440 Chronic obstructive pulmonary disease with acute lower respiratory infection: Secondary | ICD-10-CM | POA: Diagnosis not present

## 2023-08-24 DIAGNOSIS — Z9981 Dependence on supplemental oxygen: Secondary | ICD-10-CM | POA: Diagnosis not present

## 2023-08-24 DIAGNOSIS — E785 Hyperlipidemia, unspecified: Secondary | ICD-10-CM | POA: Diagnosis not present

## 2023-08-24 DIAGNOSIS — Z7901 Long term (current) use of anticoagulants: Secondary | ICD-10-CM | POA: Diagnosis not present

## 2023-08-24 DIAGNOSIS — E876 Hypokalemia: Secondary | ICD-10-CM | POA: Diagnosis not present

## 2023-08-24 DIAGNOSIS — J1000 Influenza due to other identified influenza virus with unspecified type of pneumonia: Secondary | ICD-10-CM | POA: Diagnosis not present

## 2023-08-24 DIAGNOSIS — E669 Obesity, unspecified: Secondary | ICD-10-CM | POA: Diagnosis not present

## 2023-08-24 DIAGNOSIS — J309 Allergic rhinitis, unspecified: Secondary | ICD-10-CM | POA: Diagnosis not present

## 2023-08-24 DIAGNOSIS — Z604 Social exclusion and rejection: Secondary | ICD-10-CM | POA: Diagnosis not present

## 2023-08-24 DIAGNOSIS — Z7951 Long term (current) use of inhaled steroids: Secondary | ICD-10-CM | POA: Diagnosis not present

## 2023-08-24 DIAGNOSIS — J9611 Chronic respiratory failure with hypoxia: Secondary | ICD-10-CM | POA: Diagnosis not present

## 2023-08-24 DIAGNOSIS — J101 Influenza due to other identified influenza virus with other respiratory manifestations: Secondary | ICD-10-CM | POA: Diagnosis not present

## 2023-08-24 DIAGNOSIS — K219 Gastro-esophageal reflux disease without esophagitis: Secondary | ICD-10-CM | POA: Diagnosis not present

## 2023-08-24 DIAGNOSIS — F411 Generalized anxiety disorder: Secondary | ICD-10-CM | POA: Diagnosis not present

## 2023-08-24 DIAGNOSIS — I480 Paroxysmal atrial fibrillation: Secondary | ICD-10-CM | POA: Diagnosis not present

## 2023-08-24 DIAGNOSIS — Z79899 Other long term (current) drug therapy: Secondary | ICD-10-CM | POA: Diagnosis not present

## 2023-08-24 DIAGNOSIS — Z6838 Body mass index (BMI) 38.0-38.9, adult: Secondary | ICD-10-CM | POA: Diagnosis not present

## 2023-08-24 DIAGNOSIS — J440 Chronic obstructive pulmonary disease with acute lower respiratory infection: Secondary | ICD-10-CM | POA: Diagnosis not present

## 2023-08-24 DIAGNOSIS — I058 Other rheumatic mitral valve diseases: Secondary | ICD-10-CM | POA: Diagnosis not present

## 2023-08-24 DIAGNOSIS — I509 Heart failure, unspecified: Secondary | ICD-10-CM | POA: Diagnosis not present

## 2023-08-27 DIAGNOSIS — J9611 Chronic respiratory failure with hypoxia: Secondary | ICD-10-CM | POA: Diagnosis not present

## 2023-08-27 DIAGNOSIS — J1000 Influenza due to other identified influenza virus with unspecified type of pneumonia: Secondary | ICD-10-CM | POA: Diagnosis not present

## 2023-08-27 DIAGNOSIS — J440 Chronic obstructive pulmonary disease with acute lower respiratory infection: Secondary | ICD-10-CM | POA: Diagnosis not present

## 2023-08-27 DIAGNOSIS — I480 Paroxysmal atrial fibrillation: Secondary | ICD-10-CM | POA: Diagnosis not present

## 2023-08-27 DIAGNOSIS — J101 Influenza due to other identified influenza virus with other respiratory manifestations: Secondary | ICD-10-CM | POA: Diagnosis not present

## 2023-08-27 DIAGNOSIS — I509 Heart failure, unspecified: Secondary | ICD-10-CM | POA: Diagnosis not present

## 2023-09-03 DIAGNOSIS — J1000 Influenza due to other identified influenza virus with unspecified type of pneumonia: Secondary | ICD-10-CM | POA: Diagnosis not present

## 2023-09-03 DIAGNOSIS — I480 Paroxysmal atrial fibrillation: Secondary | ICD-10-CM | POA: Diagnosis not present

## 2023-09-03 DIAGNOSIS — J440 Chronic obstructive pulmonary disease with acute lower respiratory infection: Secondary | ICD-10-CM | POA: Diagnosis not present

## 2023-09-03 DIAGNOSIS — I509 Heart failure, unspecified: Secondary | ICD-10-CM | POA: Diagnosis not present

## 2023-09-03 DIAGNOSIS — J101 Influenza due to other identified influenza virus with other respiratory manifestations: Secondary | ICD-10-CM | POA: Diagnosis not present

## 2023-09-03 DIAGNOSIS — J9611 Chronic respiratory failure with hypoxia: Secondary | ICD-10-CM | POA: Diagnosis not present

## 2023-09-09 ENCOUNTER — Ambulatory Visit: Payer: Medicare Other

## 2023-09-09 DIAGNOSIS — J438 Other emphysema: Secondary | ICD-10-CM

## 2023-09-09 NOTE — Patient Instructions (Signed)
 Visit Information  Thank you for taking time to visit with me today. Please don't hesitate to contact me if I can be of assistance to you before our next scheduled appointment.  Your next care management appointment is by telephone on 10/15/23 at 2 pm  Please call the care guide team at [phone] if you need to cancel, schedule, or reschedule an appointment.   Please call 1-800-273-TALK (toll free, 24 hour hotline) if you are experiencing a Mental Health or Behavioral Health Crisis or need someone to talk to.  Augustin Leber RN, BSN, [facility]  [facility], [facility]  Care Coordinator Phone: [phone]

## 2023-09-09 NOTE — Patient Outreach (Signed)
 Complex Care Management   Visit Note  09/09/2023  Name:  [name] Molina MRN: [id number] DOB: 01-Nov-1945  Situation: Referral received for Complex Care Management related to COPD I obtained verbal consent from Patient.  Visit completed with patient  on the phone  Background:   Past Medical History:  Diagnosis Date   Anxiety    Aortic atherosclerosis (HCC)    Arthritis    Asthma    Atrial tachycardia (HCC)    Cancer (HCC)    basal skin cancer on nose   Cataract    Chronic diastolic CHF (congestive heart failure) (HCC)    Complication of anesthesia    during cervical surgery she woke up during the procedure   COPD (chronic obstructive pulmonary disease) (HCC)    Depression    Diverticulosis    Family history of adverse reaction to anesthesia    mother had post op N&V   GERD (gastroesophageal reflux disease)    History of hiatal hernia    Hyperlipidemia    Hypertension    Insomnia    Macular degeneration    Migraines    Neuropathy    Obesity    PAF (paroxysmal atrial fibrillation) (HCC)    Pneumonia    Sleep apnea    history of it,not using a cpap, study 2018: didn't need cpap   Thyroid  nodule 2000   radioactive caspule    Assessment:  [name] Molina is currently in stable condition and does not present any respiratory issues. [facility] completed their final visit with her today. The only persistent symptom is a cough, which dates back to her admission on July 16, 2023, for influenza. She is adhering to her medication regimen, utilizing oxygen  therapy at a rate of 3 liters, and monitors her oxygen  saturation levels daily. I informed her that I will no longer be responsible for her case management and that her new Care Coordinator will be Jurline Olmsted, RN. Patient Reported Symptoms:  Cognitive Cognitive Status: Able to follow simple commands, Alert and oriented to person, place, and time, Normal speech and language skills   Healing Pattern: Average  Neurological  Neurological Review of Symptoms: No symptoms reported    HEENT HEENT Symptoms Reported: No symptoms reported      Cardiovascular Cardiovascular Symptoms Reported: No symptoms reported    Respiratory Respiratory Symptoms Reported: Productive cough Respiratory Conditions: COPD Respiratory Self-Management Outcome: 4 (good)  Endocrine Patient reports the following symptoms related to hypoglycemia or hyperglycemia : No symptoms reported    Gastrointestinal Gastrointestinal Symptoms Reported: No symptoms reported      Genitourinary Genitourinary Symptoms Reported: No symptoms reported    Integumentary Integumentary Symptoms Reported: No symptoms reported    Musculoskeletal Musculoskelatal Symptoms Reviewed: No symptoms reported        Psychosocial Psychosocial Symptoms Reported: Not assessed            04/23/2021    1:42 PM  Depression screen PHQ 2/9  Decreased Interest 0  Down, Depressed, Hopeless 0  PHQ - 2 Score 0    There were no vitals filed for this visit.  Medications Reviewed Today     Reviewed by Augustin Leber, RN (Registered Nurse) on 09/09/23 at 1421  Med List Status: <None>   Medication Order Taking? Sig Documenting Provider Last Dose Status Informant  acetaminophen  (TYLENOL ) 650 MG CR tablet [id number] Yes Take 1,300 mg by mouth daily as needed for pain. [provider] Taking Active Self  albuterol  (VENTOLIN  HFA) 108 (90 Base) MCG/ACT inhaler  [id number] Yes Inhale 2 puffs into the lungs every 6 (six) hours as needed for wheezing or shortness of breath. Margaretann Sharper, MD Taking Active Self  ALLERGY RELIEF CETIRIZINE  10 MG tablet [id number] Yes TAKE 1 TABLET BY MOUTH ONCE DAILY Byrum, Delora Ferry, MD Taking Active Self  apixaban  (ELIQUIS ) 5 MG TABS tablet [id number] Yes Take 1 tablet (5 mg total) by mouth 2 (two) times daily. Hugh Madura, MD Taking Active   BESIVANCE 0.6 % SUSP [id number] Yes Place 1 drop into the left eye See admin instructions. Instill 1  drop into the left eye 4 times daily the day OF and day AFTER (eye injection) [provider] Taking Active Self  Budeson-Glycopyrrol-Formoterol  (BREZTRI  AEROSPHERE) 160-9-4.8 MCG/ACT AERO [id number] Yes Inhale 2 puffs into the lungs in the morning and at bedtime. Denson Flake, MD Taking Active Self  Cholecalciferol 50 MCG (2000 UT) CAPS [id number] Yes Take 2,000 Units by mouth daily. [provider] Taking Active Self  diltiazem  (CARDIZEM  CD) 240 MG 24 hr capsule [id number]  TAKE 1 CAPSULE BY MOUTH EVERY MORNING Skains, Myrtle Atta, MD  Active Self  DULoxetine  (CYMBALTA ) 60 MG capsule [id number]  Take 60 mg by mouth every morning. [provider]  Active Self  flecainide  (TAMBOCOR ) 50 MG tablet [id number] Yes TAKE 1 TABLET BY MOUTH AT BREAKFAST AND AT BEDTIME Hugh Madura, MD Taking Active Self  fluticasone  (FLONASE ) 50 MCG/ACT nasal spray [id number] Yes Place 1 spray into both nostrils daily. Denson Flake, MD Taking Active Self  furosemide  (LASIX ) 40 MG tablet [id number] Yes TAKE 1 TABLET BY MOUTH EVERY MORNING Hugh Madura, MD Taking Active Self  ipratropium-albuterol  (DUONEB) 0.5-2.5 (3) MG/3ML SOLN [id number] Yes Take 3 mLs by nebulization every 4 (four) hours as needed (shortness of breath). Denson Flake, MD Taking Active   LORazepam  (ATIVAN ) 0.5 MG tablet [id number]  Take 1-2 tablets (0.5-1 mg total) by mouth daily as needed for anxiety. Anxiety Amin, Ankit C, MD  Active Self  metoprolol  succinate (TOPROL -XL) 50 MG 24 hr tablet [id number]  Take 1 tablet (50 mg total) by mouth at bedtime. Take with or immediately following a meal.  Patient taking differently: Take 50 mg by mouth in the morning. Take with or immediately following a meal.   Hugh Madura, MD  Active Self  montelukast  (SINGULAIR ) 10 MG tablet [id number] Yes Take 10 mg by mouth at bedtime. [provider] Taking Active Self           Med Note Emory Harps, Redmond Candle May 22, 2014  4:20 PM)     Multiple  Vitamins-Minerals (PRESERVISION AREDS 2+MULTI VIT) CAPS [id number] Yes Take 1 tablet by mouth 2 (two) times daily. [provider] Taking Active Self  OXYGEN  [id number] Yes Inhale 3 L into the lungs continuous. [provider] Taking Active Self  pantoprazole  (PROTONIX ) 40 MG tablet [id number] Yes Take 1 tablet (40 mg total) by mouth daily. Roetta Clarke, NP Taking Active Self  pravastatin  (PRAVACHOL ) 40 MG tablet [id number] Yes Take 40 mg by mouth at bedtime.  [provider] Taking Active Self            Recommendation:   PCP Follow-up  Follow Up Plan:   Telephone follow-up 10/15/23 2 pm  Augustin Leber RN, BSN, [facility]  [facility], [facility]  Care Coordinator Phone: [phone]

## 2023-10-01 ENCOUNTER — Encounter: Payer: Self-pay | Admitting: Emergency Medicine

## 2023-10-01 ENCOUNTER — Ambulatory Visit (INDEPENDENT_AMBULATORY_CARE_PROVIDER_SITE_OTHER): Admitting: Emergency Medicine

## 2023-10-01 VITALS — BP 112/57 | HR 89 | Ht 62.5 in

## 2023-10-01 DIAGNOSIS — J301 Allergic rhinitis due to pollen: Secondary | ICD-10-CM

## 2023-10-01 DIAGNOSIS — J438 Other emphysema: Secondary | ICD-10-CM

## 2023-10-01 DIAGNOSIS — K2100 Gastro-esophageal reflux disease with esophagitis, without bleeding: Secondary | ICD-10-CM

## 2023-10-01 DIAGNOSIS — J9611 Chronic respiratory failure with hypoxia: Secondary | ICD-10-CM

## 2023-10-01 NOTE — Assessment & Plan Note (Signed)
 Continue PPI ?

## 2023-10-01 NOTE — Patient Instructions (Addendum)
 Please continue your oxygen  at 3 L/min Continue Breztri  2 puffs twice a day. Keep your albuterol  available to use 2 puffs or 1 nebulizer treatment up to every 4 hours if needed for shortness of breath, chest tightness, wheezing. Keep your flu shot and COVID-19 vaccine up-to-date every fall Continue your cetirizine  as you have been taking Continue your pantoprazole  as you have been taking it Follow in our office in 6 months.  Please call sooner if you have any problems.

## 2023-10-01 NOTE — Assessment & Plan Note (Signed)
 Continue 3L/min all times.

## 2023-10-01 NOTE — Progress Notes (Signed)
  Subjective:    Patient ID: [name] Molina, female    DOB: [birth year], 78 y.o.   MRN: [id number] HPI  ROV 10/01/2023 --[name] Molina is 78 with COPD/fixed asthma, allergic rhinitis, GERD, upper airway irritation syndrome.  Also with a history of hypertension and A-fib with associated diastolic dysfunction.  She has chronic hypoxemic respiratory failure and is on 3 liters per minute She had influenza in February. She feels tired all the time, has had negative PSG in 2022. No chest tightness. She has baseline wheeze. No real cough.  Current regimen includes Breztri , albuterol  which she uses few times a week. Never uses her nebs these days.  Cetirizine , pantoprazole .  Not currently on fluticasone  nasal spray      Objective:   Physical Exam Vitals:   10/01/23 1146  BP: (!) 112/57  Pulse: 89  SpO2: 94%  Height: 5' 2.5" (1.588 m)    Gen: Pleasant, overweight woman, in no distress,  normal affect, wheelchair  ENT: No lesions,  mouth clear, focal rounded lesion on the tongue that is not ulcerated, no evidence of thrush  Neck: No JVD, no stridor  Lungs: No use of accessory muscles, distant, no wheeze  Cardiovascular: RRR, heart sounds normal, no murmur or gallops  Musculoskeletal: no deformities  Neuro: alert, non focal  Skin: Warm and dry w some scaling, no lesions or rashes     Assessment & Plan:  COPD (chronic obstructive pulmonary disease) (HCC) Continue Breztri  2 puffs twice a day. Keep your albuterol  available to use 2 puffs or 1 nebulizer treatment up to every 4 hours if needed for shortness of breath, chest tightness, wheezing. Keep your flu shot and COVID-19 vaccine up-to-date every fall Follow in our office in 6 months.  Please call sooner if you have any problems.  Allergic rhinitis Cetrizine   GERD (gastroesophageal reflux disease) Continue PPI  Chronic hypoxic respiratory failure (HCC) Continue 3L/min all times.      [name] Buddle, MD, PhD 10/01/2023, 12:12  PM [facility] [phone] or if no answer [phone]

## 2023-10-01 NOTE — Assessment & Plan Note (Signed)
 Continue Breztri  2 puffs twice a day. Keep your albuterol  available to use 2 puffs or 1 nebulizer treatment up to every 4 hours if needed for shortness of breath, chest tightness, wheezing. Keep your flu shot and COVID-19 vaccine up-to-date every fall Follow in our office in 6 months.  Please call sooner if you have any problems.

## 2023-10-01 NOTE — Assessment & Plan Note (Signed)
Cetrizine

## 2023-10-15 ENCOUNTER — Other Ambulatory Visit: Payer: Self-pay

## 2023-10-15 NOTE — Patient Outreach (Signed)
 Complex Care Management   Visit Note  10/15/2023  Name:  [name] Molina MRN: [id number] DOB: [birth year]  Situation: Referral received for Complex Care Management related to COPD I obtained verbal consent from Patient.  Visit completed with patient  on the phone. States her breathing is much better now that she is takng Breztri  twice a day, denies acute shortness of breath/fever/cough, continues to use her continuous oxygen  3L.    Background:   Past Medical History:  Diagnosis Date   Anxiety    Aortic atherosclerosis (HCC)    Arthritis    Asthma    Atrial tachycardia (HCC)    Cancer (HCC)    basal skin cancer on nose   Cataract    Chronic diastolic CHF (congestive heart failure) (HCC)    Complication of anesthesia    during cervical surgery she woke up during the procedure   COPD (chronic obstructive pulmonary disease) (HCC)    Depression    Diverticulosis    Family history of adverse reaction to anesthesia    mother had post op N&V   GERD (gastroesophageal reflux disease)    History of hiatal hernia    Hyperlipidemia    Hypertension    Insomnia    Macular degeneration    Migraines    Neuropathy    Obesity    PAF (paroxysmal atrial fibrillation) (HCC)    Pneumonia    Sleep apnea    history of it,not using a cpap, study 2018: didn't need cpap   Thyroid  nodule 2000   radioactive caspule    Assessment: Patient Reported Symptoms:  Cognitive Cognitive Status: Alert and oriented to person, place, and time, Normal speech and language skills, Insightful and able to interpret abstract concepts   Health Maintenance Behaviors: Annual physical exam  Neurological Neurological Review of Symptoms: No symptoms reported    HEENT HEENT Symptoms Reported: No symptoms reported HEENT Management Strategies: Routine screening HEENT Self-Management Outcome: 4 (good)    Cardiovascular Cardiovascular Symptoms Reported: No symptoms reported Does patient have uncontrolled Hypertension?:  No Cardiovascular Conditions: Heart failure, High blood cholesterol, Hypertension Cardiovascular Self-Management Outcome: 4 (good)  Respiratory Respiratory Symptoms Reported: No symptoms reported Other Respiratory Symptoms: On 3L O2 continuous, using concentrator .  She takes her Inogen with her when she leaves the house, has an extra tank in case of power outage. Respiratory Conditions: COPD Respiratory Self-Management Outcome: 4 (good)  Endocrine Patient reports the following symptoms related to hypoglycemia or hyperglycemia : No symptoms reported (A1C 5.5 on 07/18/2023)    Gastrointestinal Gastrointestinal Symptoms Reported: No symptoms reported Additional Gastrointestinal Details: Knows to get more aggressive with moving her bowels if no BM in 3-4 days, will eat apples or a "good salad" that makes her bowels move. Gastrointestinal Self-Management Outcome: 4 (good)    Genitourinary Genitourinary Symptoms Reported: Frequency, Urgency Additional Genitourinary Details: Takes furosemide  daily and causes urinary frequency. Genitourinary Self-Management Outcome: 4 (good)  Integumentary Integumentary Symptoms Reported: No symptoms reported    Musculoskeletal Musculoskelatal Symptoms Reviewed: Unsteady gait Musculoskeletal Self-Management Outcome: 4 (good) Falls in the past year?: Yes Number of falls in past year: 1 or less Was there an injury with Fall?: Yes Fall Risk Category Calculator: 2 Patient Fall Risk Level: Moderate Fall Risk Patient at Risk for Falls Due to: History of fall(s), Impaired balance/gait Fall risk Follow up: Falls prevention discussed, Education provided  Psychosocial       Quality of Family Relationships: helpful, involved, supportive Do you feel physically threatened by others?:  No      10/15/2023    3:13 PM  Depression screen PHQ 2/9  Decreased Interest 0  Down, Depressed, Hopeless 0  PHQ - 2 Score 0    There were no vitals filed for this  visit.  Medications Reviewed Today     Reviewed by Isadore Marble, RN (Registered Nurse) on 10/15/23 at 1441  Med List Status: <None>   Medication Order Taking? Sig Documenting Provider Last Dose Status Informant  acetaminophen  (TYLENOL ) 650 MG CR tablet [id number] Yes Take 1,300 mg by mouth daily as needed for pain. [provider] Taking Active Self  albuterol  (VENTOLIN  HFA) 108 (90 Base) MCG/ACT inhaler [id number] Yes Inhale 2 puffs into the lungs every 6 (six) hours as needed for wheezing or shortness of breath. Margaretann Sharper, MD Taking Active Self  ALLERGY RELIEF CETIRIZINE  10 MG tablet [id number] Yes TAKE 1 TABLET BY MOUTH ONCE DAILY Byrum, Delora Ferry, MD Taking Active Self  apixaban  (ELIQUIS ) 5 MG TABS tablet [id number] Yes Take 1 tablet (5 mg total) by mouth 2 (two) times daily. Hugh Madura, MD Taking Active   BESIVANCE 0.6 % SUSP [id number] Yes Place 1 drop into the left eye See admin instructions. Instill 1 drop into the left eye 4 times daily the day OF and day AFTER (eye injection) [provider] Taking Active Self  Budeson-Glycopyrrol-Formoterol  (BREZTRI  AEROSPHERE) 160-9-4.8 MCG/ACT AERO [id number] Yes Inhale 2 puffs into the lungs in the morning and at bedtime. Denson Flake, MD Taking Active Self  Cholecalciferol 50 MCG (2000 UT) CAPS [id number] Yes Take 2,000 Units by mouth daily. [provider] Taking Active Self  diltiazem  (CARDIZEM  CD) 240 MG 24 hr capsule [id number] Yes TAKE 1 CAPSULE BY MOUTH EVERY MORNING Skains, Myrtle Atta, MD Taking Active Self  DULoxetine  (CYMBALTA ) 60 MG capsule [id number] Yes Take 60 mg by mouth every morning. [provider] Taking Active Self  flecainide  (TAMBOCOR ) 50 MG tablet [id number] Yes TAKE 1 TABLET BY MOUTH AT BREAKFAST AND AT BEDTIME Hugh Madura, MD Taking Active Self  fluticasone  (FLONASE ) 50 MCG/ACT nasal spray [id number] Yes Place 1 spray into both nostrils daily. Denson Flake, MD Taking Active Self   furosemide  (LASIX ) 40 MG tablet [id number] Yes TAKE 1 TABLET BY MOUTH EVERY MORNING Hugh Madura, MD Taking Active Self           Med Note Gomez Lathe, [place] A   Wed Oct 15, 2023  2:28 PM) Can take an extra tablet for ankle/leg/foot swelling up to three days.   ipratropium-albuterol  (DUONEB) 0.5-2.5 (3) MG/3ML SOLN [id number] Yes Take 3 mLs by nebulization every 4 (four) hours as needed (shortness of breath). Denson Flake, MD Taking Active   LORazepam  (ATIVAN ) 0.5 MG tablet [id number] Yes Take 1-2 tablets (0.5-1 mg total) by mouth daily as needed for anxiety. Anxiety Amin, Ankit C, MD Taking Active Self  metoprolol  succinate (TOPROL -XL) 50 MG 24 hr tablet [id number] Yes Take 1 tablet (50 mg total) by mouth at bedtime. Take with or immediately following a meal.  Patient taking differently: Take 50 mg by mouth in the morning. Take with or immediately following a meal.   Hugh Madura, MD Taking Active Self  montelukast  (SINGULAIR ) 10 MG tablet [id number] Yes Take 10 mg by mouth at bedtime. [provider] Taking Active Self           Med Note Emory Harps, Redmond Candle May 22, 2014  4:20 PM)  Multiple Vitamins-Minerals (PRESERVISION AREDS 2+MULTI VIT) CAPS [id number] Yes Take 1 tablet by mouth 2 (two) times daily. [provider] Taking Active Self  OXYGEN  [id number] Yes Inhale 3 L into the lungs continuous. [provider] Taking Active Self  pantoprazole  (PROTONIX ) 40 MG tablet [id number] Yes Take 1 tablet (40 mg total) by mouth daily. Roetta Clarke, NP Taking Active Self  pravastatin  (PRAVACHOL ) 40 MG tablet [id number] Yes Take 40 mg by mouth at bedtime.  [provider] Taking Active Self            Recommendation:   Continue Current Plan of Care Educated patient to follow COPD Action Plan, to report any worsening symptoms/fever/cough right away to Pulmonary.    Follow Up Plan:   Telephone follow-up 10/29/2023 at 2:00pm.   Jurline Olmsted BSN, CCM [facility]  [facility] RN Care Manager Direct Dial: [phone]  Fax: [phone]

## 2023-10-15 NOTE — Patient Instructions (Signed)
 Visit Information  Thank you for taking time to visit with me today. Please don't hesitate to contact me if I can be of assistance to you before our next scheduled appointment.  Your next care management appointment is by telephone on 10/29/2023 at 2:00pm   Please call the care guide team at [phone] if you need to cancel, schedule, or reschedule an appointment.   Just a reminder to ALL patients/family members/friends, please call the [place]  National Suicide Prevention Lifeline: (340)003-2069 or TTY: (908)045-5540 TTY 215-090-4051) to talk to a trained counselor if you are experiencing a Mental Health or Behavioral Health Crisis or need someone to talk to.  Jurline Olmsted BSN, CCM   [facility] RN Care Manager Direct Dial: [phone]  Fax: [phone]

## 2023-10-17 ENCOUNTER — Other Ambulatory Visit: Payer: Self-pay | Admitting: Emergency Medicine

## 2023-10-17 NOTE — Telephone Encounter (Signed)
 Copied from CRM [id number]. Topic: Clinical - Medication Refill >> Oct 17, 2023 11:22 AM Crist Dominion wrote: Medication: montelukast  (SINGULAIR ) 10 MG tablet  Has the patient contacted their pharmacy? Yes but needs a new prescription.  (Agent: If no, request that the patient contact the pharmacy for the refill. If patient does not wish to contact the pharmacy document the reason why and proceed with request.) (Agent: If yes, when and what did the pharmacy advise?)  This is the patient's preferred pharmacy:Yes   [facility] [place]  Dorrene Gaucher, [place] - [address] [place] [address] Phone: [phone] Fax: [phone]  Is this the correct pharmacy for this prescription? Yes If no, delete pharmacy and type the correct one.   Has the prescription been filled recently? NO, patient states she's been forgetting to take this medication for months.   Is the patient out of the medication? Yes, for 2-3 months   Has the patient been seen for an appointment in the last year OR does the patient have an upcoming appointment? Yes  Can we respond through MyChart? No  Agent: Please be advised that Rx refills may take up to 3 business days. We ask that you follow-up with your pharmacy.

## 2023-10-29 ENCOUNTER — Other Ambulatory Visit: Payer: Self-pay

## 2023-10-29 NOTE — Patient Outreach (Signed)
 Complex Care Management   Visit Note  10/29/2023  Name:  [name] Molina MRN: [id number] DOB: 05-Feb-1946  Situation: Referral received for Complex Care Management related to COPD I obtained verbal consent from Patient.  Visit completed with [name]  on the phone.  Reports she visited the dentist for a cleaning last week, no other concerns today.   Background:   Past Medical History:  Diagnosis Date   Anxiety    Aortic atherosclerosis (HCC)    Arthritis    Asthma    Atrial tachycardia (HCC)    Cancer (HCC)    basal skin cancer on nose   Cataract    Chronic diastolic CHF (congestive heart failure) (HCC)    Complication of anesthesia    during cervical surgery she woke up during the procedure   COPD (chronic obstructive pulmonary disease) (HCC)    Depression    Diverticulosis    Family history of adverse reaction to anesthesia    mother had post op N&V   GERD (gastroesophageal reflux disease)    History of hiatal hernia    Hyperlipidemia    Hypertension    Insomnia    Macular degeneration    Migraines    Neuropathy    Obesity    PAF (paroxysmal atrial fibrillation) (HCC)    Pneumonia    Sleep apnea    history of it,not using a cpap, study 2018: didn't need cpap   Thyroid  nodule 2000   radioactive caspule    Assessment: Patient Reported Symptoms:  Cognitive Cognitive Status: Alert and oriented to person, place, and time, Insightful and able to interpret abstract concepts, Normal speech and language skills Cognitive/Intellectual Conditions Management [RPT]: None reported or documented in medical history or problem list      Neurological Neurological Review of Symptoms: No symptoms reported    HEENT HEENT Symptoms Reported: No symptoms reported      Cardiovascular Cardiovascular Symptoms Reported: No symptoms reported Does patient have uncontrolled Hypertension?: No    Respiratory Respiratory Symptoms Reported: No symptoms reported    Endocrine Patient reports  the following symptoms related to hypoglycemia or hyperglycemia : Not assessed    Gastrointestinal Gastrointestinal Symptoms Reported: No symptoms reported      Genitourinary Genitourinary Symptoms Reported: Not assessed    Integumentary Integumentary Symptoms Reported: Not assessed    Musculoskeletal Musculoskelatal Symptoms Reviewed: Unsteady gait        Psychosocial Psychosocial Symptoms Reported: No symptoms reported            10/15/2023    3:13 PM  Depression screen PHQ 2/9  Decreased Interest 0  Down, Depressed, Hopeless 0  PHQ - 2 Score 0    There were no vitals filed for this visit.  Medications Reviewed Today   Medications were not reviewed in this encounter     Recommendation:   Continue Current Plan of Care - PCP follow up scheduled for 11/26/23 -Dental appointment 11/17/23, following up on one cavity.   Follow Up Plan:   Telephone follow-up in 1 month  Jurline Olmsted BSN, CCM [place]  [facility] RN Care Manager Direct Dial: [phone]  Fax: [phone]

## 2023-10-29 NOTE — Patient Instructions (Signed)
 Visit Information  Thank you for taking time to visit with me today. Please don't hesitate to contact me if I can be of assistance to you before our next scheduled appointment.  Your next care management appointment is by telephone on Thursday, July 10th at 11:00am.   Please call the care guide team at [phone] if you need to cancel, schedule, or reschedule an appointment.   A reminder to ALL patients/family/friends,please call the [place]  National Suicide Prevention Lifeline: 313-868-6272 or TTY: (480) 462-8498 TTY (680)546-3110) to talk to a trained counselor if you are experiencing a Mental Health or Behavioral Health Crisis or need someone to talk to.  Jurline Olmsted BSN, CCM [place]  [facility] RN Care Manager Direct Dial: [phone]  Fax: [phone]

## 2023-11-03 ENCOUNTER — Telehealth: Payer: Self-pay | Admitting: Emergency Medicine

## 2023-11-03 MED ORDER — MONTELUKAST SODIUM 10 MG PO TABS: 10.0000 mg | ORAL_TABLET | Freq: Every day | ORAL | 12 refills | Status: AC

## 2023-11-03 NOTE — Telephone Encounter (Signed)
 Spoke with [name] Molina. Rx has been sent in to preferred pharmacy. Pt is aware nfn.

## 2023-11-03 NOTE — Telephone Encounter (Signed)
 Okay to refill her montelukast  for a 1 year supply if it has not been done already

## 2023-11-03 NOTE — Telephone Encounter (Signed)
 Copied from CRM [id number]. Topic: Clinical - Medication Refill >> Nov 03, 2023 11:37 AM Tyronne Galloway wrote: Medication: montelukast  (SINGULAIR ) 10 MG tablet   Has the patient contacted their pharmacy? Yes; patient stated she is getting the back and forth regarding this medication between the pharmacy and the clinic. Pt was told she needed an order from her provider. (Agent: If no, request that the patient contact the pharmacy for the refill. If patient does not wish to contact the pharmacy document the reason why and proceed with request.) (Agent: If yes, when and what did the pharmacy advise?)  This is the patient's preferred pharmacy:  [facility] [place]  Dorrene Gauc[place], [place] - [address] [place] [address] Phone: [phone] Fax: [phone]  Is this the correct pharmacy for this prescription? Yes If no, delete pharmacy and type the correct one.   Has the prescription been filled recently? No  Is the patient out of the medication? Yes  Has the patient been seen for an appointment in the last year OR does the patient have an upcoming appointment? Yes  Can we respond through MyChart? Yes  Agent: Please be advised that Rx refills may take up to 3 business days. We ask that you follow-up with your pharmacy.

## 2023-11-03 NOTE — Telephone Encounter (Signed)
 Please advise Dr. Delton Coombes

## 2023-11-26 DIAGNOSIS — R413 Other amnesia: Secondary | ICD-10-CM | POA: Diagnosis not present

## 2023-11-26 DIAGNOSIS — I10 Essential (primary) hypertension: Secondary | ICD-10-CM | POA: Diagnosis not present

## 2023-11-26 DIAGNOSIS — E538 Deficiency of other specified B group vitamins: Secondary | ICD-10-CM | POA: Diagnosis not present

## 2023-11-26 DIAGNOSIS — F329 Major depressive disorder, single episode, unspecified: Secondary | ICD-10-CM | POA: Diagnosis not present

## 2023-11-26 DIAGNOSIS — M109 Gout, unspecified: Secondary | ICD-10-CM | POA: Diagnosis not present

## 2023-11-26 DIAGNOSIS — Z79899 Other long term (current) drug therapy: Secondary | ICD-10-CM | POA: Diagnosis not present

## 2023-11-26 DIAGNOSIS — R7303 Prediabetes: Secondary | ICD-10-CM | POA: Diagnosis not present

## 2023-11-26 DIAGNOSIS — D509 Iron deficiency anemia, unspecified: Secondary | ICD-10-CM | POA: Diagnosis not present

## 2023-11-26 DIAGNOSIS — J9611 Chronic respiratory failure with hypoxia: Secondary | ICD-10-CM | POA: Diagnosis not present

## 2023-11-26 DIAGNOSIS — I480 Paroxysmal atrial fibrillation: Secondary | ICD-10-CM | POA: Diagnosis not present

## 2023-11-26 DIAGNOSIS — M8588 Other specified disorders of bone density and structure, other site: Secondary | ICD-10-CM | POA: Diagnosis not present

## 2023-11-26 DIAGNOSIS — I5032 Chronic diastolic (congestive) heart failure: Secondary | ICD-10-CM | POA: Diagnosis not present

## 2023-11-26 DIAGNOSIS — G629 Polyneuropathy, unspecified: Secondary | ICD-10-CM | POA: Diagnosis not present

## 2023-11-26 DIAGNOSIS — Z0000 Encounter for general adult medical examination without abnormal findings: Secondary | ICD-10-CM | POA: Diagnosis not present

## 2023-11-26 DIAGNOSIS — E785 Hyperlipidemia, unspecified: Secondary | ICD-10-CM | POA: Diagnosis not present

## 2023-11-27 ENCOUNTER — Other Ambulatory Visit: Payer: Self-pay

## 2023-11-27 NOTE — Patient Outreach (Signed)
 Complex Care Management   Visit Note  11/27/2023  Name:  [name] Molina MRN: [id number] DOB: [birth year]  Situation: Referral received for Complex Care Management related to COPD I obtained verbal consent from Patient.  Visit completed with [name] Molina  on the phone. No concerns today, denies acute shortness of breath/cough, she is using inhalers as ordered, very pleased with Breztri , has used albuterol  less often since starting Breztri .  She saw PCP yesterday, increased Vitamin D3 to two tablets daily.  She declines information on [facility] [place]), states she may already have the application in her files.   Background:   Past Medical History:  Diagnosis Date   Anxiety    Aortic atherosclerosis (HCC)    Arthritis    Asthma    Atrial tachycardia (HCC)    Cancer (HCC)    basal skin cancer on nose   Cataract    Chronic diastolic CHF (congestive heart failure) (HCC)    Complication of anesthesia    during cervical surgery she woke up during the procedure   COPD (chronic obstructive pulmonary disease) (HCC)    Depression    Diverticulosis    Family history of adverse reaction to anesthesia    mother had post op N&V   GERD (gastroesophageal reflux disease)    History of hiatal hernia    Hyperlipidemia    Hypertension    Insomnia    Macular degeneration    Migraines    Neuropathy    Obesity    PAF (paroxysmal atrial fibrillation) (HCC)    Pneumonia    Sleep apnea    history of it,not using a cpap, study 2018: didn't need cpap   Thyroid  nodule 2000   radioactive caspule    Assessment: Patient Reported Symptoms:  Cognitive Cognitive Status: Alert and oriented to person, place, and time, Insightful and able to interpret abstract concepts, Normal speech and language skills      Neurological Neurological Review of Symptoms: No symptoms reported Neurological Self-Management Outcome: 4 (good)  HEENT HEENT Symptoms Reported: Not assessed      Cardiovascular  Cardiovascular Symptoms Reported:  (Left ankle is swollen due to not taking Lasix  yesterday/had an office visit with PCP. She has taken Lasix  today.) Cardiovascular Self-Management Outcome: 5 (very good)  Respiratory Respiratory Symptoms Reported: No symptoms reported Other Respiratory Symptoms: Continues us  3L O2 continuous, using concentrator, Inogen portable when she leaves the house. Respiratory Self-Management Outcome: 4 (good)  Endocrine Endocrine Symptoms Reported: No symptoms reported (A1C 5.4% on 11/26/2023)    Gastrointestinal Gastrointestinal Symptoms Reported: No symptoms reported      Genitourinary Genitourinary Symptoms Reported: Frequency    Integumentary Integumentary Symptoms Reported: Not assessed    Musculoskeletal Musculoskelatal Symptoms Reviewed: Unsteady gait        Psychosocial Psychosocial Symptoms Reported: Not assessed            10/15/2023    3:13 PM  Depression screen PHQ 2/9  Decreased Interest 0  Down, Depressed, Hopeless 0  PHQ - 2 Score 0    There were no vitals filed for this visit.  Medications Reviewed Today     Reviewed by Lucian Santana LABOR, RN (Registered Nurse) on 11/27/23 at 1140  Med List Status: <None>   Medication Order Taking? Sig Documenting Provider Last Dose Status Informant  acetaminophen  (TYLENOL ) 650 MG CR tablet [id number] Yes Take 1,300 mg by mouth daily as needed for pain. [provider]  Active Self  albuterol  (VENTOLIN  HFA) 108 (90 Base)  MCG/ACT inhaler [id number] Yes Inhale 2 puffs into the lungs every 6 (six) hours as needed for wheezing or shortness of breath. Neda Jennet LABOR, MD  Active Self  ALLERGY RELIEF CETIRIZINE  10 MG tablet [id number]  TAKE 1 TABLET BY MOUTH ONCE DAILY Byrum, Lamar RAMAN, MD  Active Self  apixaban  (ELIQUIS ) 5 MG TABS tablet [id number] Yes Take 1 tablet (5 mg total) by mouth 2 (two) times daily. Jeffrie Oneil BROCKS, MD  Active   BESIVANCE 0.6 % SUSP [id number]  Place 1 drop into the left eye See  admin instructions. Instill 1 drop into the left eye 4 times daily the day OF and day AFTER (eye injection) [provider]  Active Self  Budeson-Glycopyrrol-Formoterol  (BREZTRI  AEROSPHERE) 160-9-4.8 MCG/ACT AERO [id number] Yes Inhale 2 puffs into the lungs in the morning and at bedtime. Shelah Lamar RAMAN, MD  Active Self  Cholecalciferol 50 MCG (2000 UT) CAPS [id number]  Take 2,000 Units by mouth daily. [provider]  Active Self  diltiazem  (CARDIZEM  CD) 240 MG 24 hr capsule [id number]  TAKE 1 CAPSULE BY MOUTH EVERY MORNING Skains, Oneil BROCKS, MD  Active Self  DULoxetine  (CYMBALTA ) 60 MG capsule [id number]  Take 60 mg by mouth every morning. [provider]  Active Self  flecainide  (TAMBOCOR ) 50 MG tablet [id number]  TAKE 1 TABLET BY MOUTH AT BREAKFAST AND AT BEDTIME Jeffrie Oneil BROCKS, MD  Active Self  fluticasone  (FLONASE ) 50 MCG/ACT nasal spray [id number]  Place 1 spray into both nostrils daily. Byrum, Robert S, MD  Active Self  furosemide  (LASIX ) 40 MG tablet [id number] Yes TAKE 1 TABLET BY MOUTH EVERY MORNING Jeffrie Oneil BROCKS, MD  Active Self           Med Note CARLISLE, [place] A   Wed Oct 15, 2023  2:28 PM) Can take an extra tablet for ankle/leg/foot swelling up to three days.   ipratropium-albuterol  (DUONEB) 0.5-2.5 (3) MG/3ML SOLN [id number] Yes Take 3 mLs by nebulization every 4 (four) hours as needed (shortness of breath). Shelah Lamar RAMAN, MD  Active   LORazepam  (ATIVAN ) 0.5 MG tablet [id number] Yes Take 1-2 tablets (0.5-1 mg total) by mouth daily as needed for anxiety. Anxiety Amin, Ankit C, MD  Active Self  metoprolol  succinate (TOPROL -XL) 50 MG 24 hr tablet [id number] Yes Take 1 tablet (50 mg total) by mouth at bedtime. Take with or immediately following a meal. Jeffrie Oneil BROCKS, MD  Active Self  montelukast  (SINGULAIR ) 10 MG tablet [id number]  Take 1 tablet (10 mg total) by mouth at bedtime. Byrum, Robert S, MD  Active   Multiple Vitamins-Minerals (PRESERVISION AREDS 2+MULTI VIT) CAPS  [id number]  Take 1 tablet by mouth 2 (two) times daily. [provider]  Active Self  OXYGEN  [id number] Yes Inhale 3 L into the lungs continuous. [provider]  Active Self  pantoprazole  (PROTONIX ) 40 MG tablet [id number]  Take 1 tablet (40 mg total) by mouth daily. Malachy Comer GAILS, NP  Active Self  pravastatin  (PRAVACHOL ) 40 MG tablet [id number] Yes Take 40 mg by mouth at bedtime.  [provider]  Active Self            Recommendation:   Specialty provider follow-up :[facility] 12/02/23 -Ms. Bridwell will call Pulmonology for any increased shortness of breath/refills on inhalers. Continues to take albuterol  inhaler and Inogen oxygen  when she goes out of the house.    -  Follow Up Plan:   Telephone follow-up in 1 month  [employer], CCM  [place]  [facility] Population Health RN Care Manager Direct Dial: [phone]  Fax: [phone]

## 2023-11-27 NOTE — Patient Instructions (Signed)
 Visit Information  Thank you for taking time to visit with me today. Please don't hesitate to contact me if I can be of assistance to you before our next scheduled appointment.  Your next care management appointment is by telephone on Thursday, August 7th at 11:00am.   Please call the care guide team at [phone] if you need to cancel, schedule, or reschedule an appointment.   A reminder to ALL patients/family/friends, please call the [place]  National Suicide Prevention Lifeline: 905-851-7085 or TTY: 615-715-2743 TTY [phone]) to talk to a trained counselor if you are experiencing a Mental Health or Behavioral Health Crisis or need someone to talk to.  Santana Stamp BSN, CCM [place]  [facility] Population Health RN Care Manager Direct Dial: [phone]  Fax: [phone]

## 2023-12-02 ENCOUNTER — Encounter (INDEPENDENT_AMBULATORY_CARE_PROVIDER_SITE_OTHER): Payer: Medicare Other | Admitting: Ophthalmology

## 2023-12-02 DIAGNOSIS — H43813 Vitreous degeneration, bilateral: Secondary | ICD-10-CM

## 2023-12-02 DIAGNOSIS — H353112 Nonexudative age-related macular degeneration, right eye, intermediate dry stage: Secondary | ICD-10-CM | POA: Diagnosis not present

## 2023-12-02 DIAGNOSIS — H353221 Exudative age-related macular degeneration, left eye, with active choroidal neovascularization: Secondary | ICD-10-CM | POA: Diagnosis not present

## 2023-12-02 DIAGNOSIS — H35033 Hypertensive retinopathy, bilateral: Secondary | ICD-10-CM | POA: Diagnosis not present

## 2023-12-02 DIAGNOSIS — I10 Essential (primary) hypertension: Secondary | ICD-10-CM

## 2023-12-25 ENCOUNTER — Other Ambulatory Visit: Payer: Self-pay

## 2023-12-25 NOTE — Patient Outreach (Addendum)
 Complex Care Management   Visit Note  12/25/2023  Name:  [name] Molina MRN: [id number] DOB: 07-Feb-1946  Situation: Referral received for Complex Care Management related to Emphysemia. I obtained verbal consent from Patient.  Visit completed with [name] Molina  on the phone.  Patient fell to the ground two weeks ago, states she was sore on buttocks and hip, soreness has improved, no pain when sitting or standing.   No other concerns today.   Background:   Past Medical History:  Diagnosis Date   Anxiety    Aortic atherosclerosis (HCC)    Arthritis    Asthma    Atrial tachycardia (HCC)    Cancer (HCC)    basal skin cancer on nose   Cataract    Chronic diastolic CHF (congestive heart failure) (HCC)    Complication of anesthesia    during cervical surgery she woke up during the procedure   COPD (chronic obstructive pulmonary disease) (HCC)    Depression    Diverticulosis    Family history of adverse reaction to anesthesia    mother had post op N&V   GERD (gastroesophageal reflux disease)    History of hiatal hernia    Hyperlipidemia    Hypertension    Insomnia    Macular degeneration    Migraines    Neuropathy    Obesity    PAF (paroxysmal atrial fibrillation) (HCC)    Pneumonia    Sleep apnea    history of it,not using a cpap, study 2018: didn't need cpap   Thyroid  nodule 2000   radioactive caspule    Assessment: Patient Reported Symptoms:  Cognitive Cognitive Status: No symptoms reported, Alert and oriented to person, place, and time, Insightful and able to interpret abstract concepts, Normal speech and language skills      Neurological Neurological Review of Symptoms: No symptoms reported Neurological Self-Management Outcome: 4 (good)  HEENT HEENT Symptoms Reported: No symptoms reported      Cardiovascular Cardiovascular Symptoms Reported: No symptoms reported    Respiratory Respiratory Symptoms Reported: No symptoms reported Other Respiratory Symptoms: Continues to  use 3LO2 continuous, using concentrator, Inogen portable when she leaves the house. Respiratory Self-Management Outcome: 4 (good)  Endocrine Endocrine Symptoms Reported: Not assessed    Gastrointestinal Gastrointestinal Symptoms Reported: No symptoms reported      Genitourinary Genitourinary Symptoms Reported: Frequency, Urgency (At baseline, takes furosemide  as prescribed which causes frequency/urgency.) Genitourinary Self-Management Outcome: 4 (good)  Integumentary Integumentary Symptoms Reported: No symptoms reported    Musculoskeletal Musculoskelatal Symptoms Reviewed: Unsteady gait Musculoskeletal Comment: Had another fall on 01/18/24, was standing up out of chair, reached for rollator, fell to the ground, did not hit her head, fell on buttocks and hip, did not go to ED.  Discussed taking care when going from sitting to standing, stand for a few moments before walking or reaching.      Psychosocial Psychosocial Symptoms Reported: Not assessed            10/15/2023    3:13 PM  Depression screen PHQ 2/9  Decreased Interest 0  Down, Depressed, Hopeless 0  PHQ - 2 Score 0    There were no vitals filed for this visit.  Medications Reviewed Today     Reviewed by Lucian Santana LABOR, RN (Registered Nurse) on 12/25/23 at 1118  Med List Status: <None>   Medication Order Taking? Sig Documenting Provider Last Dose Status Informant  acetaminophen  (TYLENOL ) 650 MG CR tablet [id number]  Take 1,300 mg by mouth  daily as needed for pain. [provider]  Active Self  albuterol  (VENTOLIN  HFA) 108 (90 Base) MCG/ACT inhaler [id number]  Inhale 2 puffs into the lungs every 6 (six) hours as needed for wheezing or shortness of breath. Neda Jennet LABOR, MD  Active Self  ALLERGY RELIEF CETIRIZINE  10 MG tablet [id number]  TAKE 1 TABLET BY MOUTH ONCE DAILY Byrum, Lamar RAMAN, MD  Active Self  apixaban  (ELIQUIS ) 5 MG TABS tablet [id number]  Take 1 tablet (5 mg total) by mouth 2 (two) times daily.  Jeffrie Oneil BROCKS, MD  Active   BESIVANCE 0.6 % SUSP [id number]  Place 1 drop into the left eye See admin instructions. Instill 1 drop into the left eye 4 times daily the day OF and day AFTER (eye injection) [provider]  Active Self  Budeson-Glycopyrrol-Formoterol  (BREZTRI  AEROSPHERE) 160-9-4.8 MCG/ACT AERO [id number]  Inhale 2 puffs into the lungs in the morning and at bedtime. Shelah Lamar RAMAN, MD  Active Self  Cholecalciferol 50 MCG (2000 UT) CAPS [id number]  Take 2,000 Units by mouth daily. [provider]  Active Self  cyanocobalamin  (VITAMIN B12) 1000 MCG tablet [id number] Yes Take 1,000 mcg by mouth daily. [provider]  Active   diltiazem  (CARDIZEM  CD) 240 MG 24 hr capsule [id number]  TAKE 1 CAPSULE BY MOUTH EVERY MORNING Skains, Oneil BROCKS, MD  Active Self  DULoxetine  (CYMBALTA ) 60 MG capsule [id number]  Take 60 mg by mouth every morning. [provider]  Active Self  flecainide  (TAMBOCOR ) 50 MG tablet [id number]  TAKE 1 TABLET BY MOUTH AT BREAKFAST AND AT BEDTIME Jeffrie Oneil BROCKS, MD  Active Self  fluticasone  (FLONASE ) 50 MCG/ACT nasal spray [id number]  Place 1 spray into both nostrils daily. Shelah Lamar RAMAN, MD  Active Self  furosemide  (LASIX ) 40 MG tablet [id number]  TAKE 1 TABLET BY MOUTH EVERY MORNING Jeffrie Oneil BROCKS, MD  Active Self           Med Note CARLISLE, [place] A   Wed Oct 15, 2023  2:28 PM) Can take an extra tablet for ankle/leg/foot swelling up to three days.   ipratropium-albuterol  (DUONEB) 0.5-2.5 (3) MG/3ML SOLN [id number]  Take 3 mLs by nebulization every 4 (four) hours as needed (shortness of breath). Byrum, Robert S, MD  Active   LORazepam  (ATIVAN ) 0.5 MG tablet [id number]  Take 1-2 tablets (0.5-1 mg total) by mouth daily as needed for anxiety. Anxiety Amin, Ankit C, MD  Active Self  metoprolol  succinate (TOPROL -XL) 50 MG 24 hr tablet [id number]  Take 1 tablet (50 mg total) by mouth at bedtime. Take with or immediately following a meal. Jeffrie Oneil BROCKS, MD   Active Self  montelukast  (SINGULAIR ) 10 MG tablet [id number]  Take 1 tablet (10 mg total) by mouth at bedtime. Byrum, Robert S, MD  Active   Multiple Vitamins-Minerals (PRESERVISION AREDS 2+MULTI VIT) CAPS [id number]  Take 1 tablet by mouth 2 (two) times daily. [provider]  Active Self  OXYGEN  [id number]  Inhale 3 L into the lungs continuous. [provider]  Active Self  pantoprazole  (PROTONIX ) 40 MG tablet [id number]  Take 1 tablet (40 mg total) by mouth daily. Malachy Comer GAILS, NP  Active Self  pravastatin  (PRAVACHOL ) 40 MG tablet [id number]  Take 40 mg by mouth at bedtime.  [provider]  Active Self            Recommendation:   PCP Specialty provider follow-up : Pulmonology, Cardiology  -She plans on getting influenza vaccine sometime  late Oct/early Nov, educated on hand hygiene, staying hydrated, eating healthy foods, wear mask if she is going to be in close quarters with people.  -Educated on standing in place for at least 30 seconds when going from sitting to standing.  She is aware she must go to ED if she falls and hits her head or if she experiences severe pain in pelvis/hips.   Follow Up Plan:   Telephone follow-up in 1 month  Santana Stamp BSN, CCM [place]  [facility] Population Health RN Care Manager Direct Dial: [phone]  Fax: [phone]

## 2023-12-25 NOTE — Patient Instructions (Signed)
 Visit Information  Thank you for taking time to visit with me today. Please don't hesitate to contact me if I can be of assistance to you before our next scheduled appointment.  Your next care management appointment is by telephone on Thursday, September 9th at 11:00am.    Please call the care guide team at [phone] if you need to cancel, schedule, or reschedule an appointment.   A reminder to ALL patients/family/friends, please call the [place]  National Suicide Prevention Lifeline: 314-363-1848 or TTY: 608-153-6851 TTY 580-887-7869) to talk to a trained counselor if you are experiencing a Mental Health or Behavioral Health Crisis or need someone to talk to.  Santana Stamp BSN, CCM [place]  [facility] Population Health RN Care Manager Direct Dial: [phone]  Fax: [phone]

## 2024-01-22 ENCOUNTER — Other Ambulatory Visit: Payer: Self-pay

## 2024-01-22 NOTE — Patient Instructions (Signed)
 Visit Information  Thank you for taking time to visit with me today. Please don't hesitate to contact me if I can be of assistance to you before our next scheduled appointment.  Your next care management appointment is by telephone on Thursday, October 2nd  at 11:00am.    Please call the care guide team at [phone] if you need to cancel, schedule, or reschedule an appointment.   A reminder to ALL patients/family/friends, please call the [place]  National Suicide Prevention Lifeline: (579)750-9599 or TTY: 575-647-0248 TTY 343-776-4360) to talk to a trained counselor if you are experiencing a Mental Health or Behavioral Health Crisis or need someone to talk to.  Santana Stamp BSN, CCM [place]  [facility] Population Health RN Care Manager Direct Dial: [phone]  Fax: [phone]

## 2024-01-22 NOTE — Patient Outreach (Signed)
 Complex Care Management   Visit Note  01/22/2024  Name:  [name] Molina MRN: [id number] DOB: July 21, 1945  Situation: Referral received for Complex Care Management related to COPD I obtained verbal consent from Patient.  Visit completed with [name] Molina  on the phone.  No concerns today. Discussed medications: she takes as prescribed, receives prepackaged pillpack from [facility], this works well for her. Received Breztri  through patient assistance program.  She is aware Breztri  is taken 2 times a day, albuterol  is her rescue inhaler.  Discussed immune health due to upcoming flu season.   Background:   Past Medical History:  Diagnosis Date   Anxiety    Aortic atherosclerosis (HCC)    Arthritis    Asthma    Atrial tachycardia (HCC)    Cancer (HCC)    basal skin cancer on nose   Cataract    Chronic diastolic CHF (congestive heart failure) (HCC)    Complication of anesthesia    during cervical surgery she woke up during the procedure   COPD (chronic obstructive pulmonary disease) (HCC)    Depression    Diverticulosis    Family history of adverse reaction to anesthesia    mother had post op N&V   GERD (gastroesophageal reflux disease)    History of hiatal hernia    Hyperlipidemia    Hypertension    Insomnia    Macular degeneration    Migraines    Neuropathy    Obesity    PAF (paroxysmal atrial fibrillation) (HCC)    Pneumonia    Sleep apnea    history of it,not using a cpap, study 2018: didn't need cpap   Thyroid  nodule 2000   radioactive caspule    Assessment: Patient Reported Symptoms:  Cognitive Cognitive Status: No symptoms reported, Alert and oriented to person, place, and time, Insightful and able to interpret abstract concepts, Normal speech and language skills      Neurological Neurological Review of Symptoms: Not assessed    HEENT HEENT Symptoms Reported: No symptoms reported      Cardiovascular Cardiovascular Symptoms Reported: No symptoms reported     Respiratory Respiratory Symptoms Reported: No symptoms reported Other Respiratory Symptoms: Breathing is at baseline.  Continues to use 3LO2 continuous, using concentrator, Inogen portable when she leaves the house. Additional Respiratory Details: Taking Breztri  twice a day, understands albuterol  is her rescue inhaler. Respiratory Management Strategies: Medication therapy, Oxygen  therapy, Routine screening Respiratory Self-Management Outcome: 4 (good)  Endocrine Endocrine Symptoms Reported: Not assessed    Gastrointestinal Gastrointestinal Symptoms Reported: No symptoms reported      Genitourinary Genitourinary Symptoms Reported: Not assessed    Integumentary Integumentary Symptoms Reported: Not assessed    Musculoskeletal Musculoskelatal Symptoms Reviewed: Unsteady gait Additional Musculoskeletal Details: No falls since 01/18/24.        Psychosocial Psychosocial Symptoms Reported: Not assessed          01/22/2024    PHQ2-9 Depression Screening   Little interest or pleasure in doing things    Feeling down, depressed, or hopeless    PHQ-2 - Total Score    Trouble falling or staying asleep, or sleeping too much    Feeling tired or having little energy    Poor appetite or overeating     Feeling bad about yourself - or that you are a failure or have let yourself or your family down    Trouble concentrating on things, such as reading the newspaper or watching television    Moving or speaking so  slowly that other people could have noticed.  Or the opposite - being so fidgety or restless that you have been moving around a lot more than usual    Thoughts that you would be better off dead, or hurting yourself in some way    PHQ2-9 Total Score    If you checked off any problems, how difficult have these problems made it for you to do your work, take care of things at home, or get along with other people    Depression Interventions/Treatment      There were no vitals filed for this  visit.  Medications Reviewed Today   Medications were not reviewed in this encounter     Recommendation:   Specialty provider follow-up : Pulmonology 03/03/24 Patient will get flu vaccine closer to Fall season, around end of October/beginning of November.    Follow Up Plan:   Telephone follow-up in 1 month  Santana Stamp BSN, CCM [place]  [facility] Population Health RN Care Manager Direct Dial: [phone]  Fax: [phone]

## 2024-02-02 ENCOUNTER — Other Ambulatory Visit: Payer: Self-pay | Admitting: Cardiology

## 2024-02-03 NOTE — Telephone Encounter (Signed)
 Prescription refill request for Eliquis  received. Indication:afib Last office visit:11/24 Scr:0.89  2/25 Age: 78 Weight:98.8  kg  Prescription refilled

## 2024-02-19 ENCOUNTER — Other Ambulatory Visit: Payer: Self-pay

## 2024-02-19 NOTE — Patient Instructions (Signed)
 Visit Information  Thank you for taking time to visit with me today. Please don't hesitate to contact me if I can be of assistance to you before our next scheduled appointment.  Your next care management appointment is by telephone on Monday,  November 3rd at 11:00am.   Please call the care guide team at [phone] if you need to cancel, schedule, or reschedule an appointment.   A reminder to ALL patients/family/friends, please call the [place]  National Suicide Prevention Lifeline: (519)778-3509 or TTY: 628-464-7794 TTY [phone]) to talk to a trained counselor if you are experiencing a Mental Health or Behavioral Health Crisis or need someone to talk to.  Santana Stamp BSN, CCM [place]  [facility] Population Health RN Care Manager Direct Dial: [phone]  Fax: [phone]'

## 2024-02-19 NOTE — Patient Outreach (Signed)
 Complex Care Management   Visit Note  02/19/2024  Name:  [name] Molina MRN: [id number] DOB: [birth year]  Situation: Referral received for Complex Care Management related to COPD. I obtained verbal consent from Patient.  Visit completed with Ms. Scalf  on the phone. No concerns today.   Background:   Past Medical History:  Diagnosis Date   Anxiety    Aortic atherosclerosis    Arthritis    Asthma    Atrial tachycardia    Cancer (HCC)    basal skin cancer on nose   Cataract    Chronic diastolic CHF (congestive heart failure) (HCC)    Complication of anesthesia    during cervical surgery she woke up during the procedure   COPD (chronic obstructive pulmonary disease) (HCC)    Depression    Diverticulosis    Family history of adverse reaction to anesthesia    mother had post op N&V   GERD (gastroesophageal reflux disease)    History of hiatal hernia    Hyperlipidemia    Hypertension    Insomnia    Macular degeneration    Migraines    Neuropathy    Obesity    PAF (paroxysmal atrial fibrillation) (HCC)    Pneumonia    Sleep apnea    history of it,not using a cpap, study 2018: didn't need cpap   Thyroid  nodule 2000   radioactive caspule    Assessment: Patient Reported Symptoms:  Cognitive Cognitive Status: No symptoms reported, Insightful and able to interpret abstract concepts, Alert and oriented to person, place, and time, Normal speech and language skills      Neurological Neurological Review of Symptoms: Not assessed    HEENT HEENT Symptoms Reported: Not assessed      Cardiovascular Cardiovascular Symptoms Reported: No symptoms reported    Respiratory Other Respiratory Symptoms: Breathing is at baseline. Continues to use 3LO2 continuous, using concentrator, Inogen portable when she leaves the house.    Endocrine Endocrine Symptoms Reported: Not assessed Is patient diabetic?: No    Gastrointestinal Gastrointestinal Symptoms Reported: Not assessed       Genitourinary Genitourinary Symptoms Reported: Not assessed    Integumentary Integumentary Symptoms Reported: Not assessed    Musculoskeletal Musculoskelatal Symptoms Reviewed: Not assessed        Psychosocial Psychosocial Symptoms Reported: Not assessed          02/19/2024    PHQ2-9 Depression Screening   Little interest or pleasure in doing things    Feeling down, depressed, or hopeless    PHQ-2 - Total Score    Trouble falling or staying asleep, or sleeping too much    Feeling tired or having little energy    Poor appetite or overeating     Feeling bad about yourself - or that you are a failure or have let yourself or your family down    Trouble concentrating on things, such as reading the newspaper or watching television    Moving or speaking so slowly that other people could have noticed.  Or the opposite - being so fidgety or restless that you have been moving around a lot more than usual    Thoughts that you would be better off dead, or hurting yourself in some way    PHQ2-9 Total Score    If you checked off any problems, how difficult have these problems made it for you to do your work, take care of things at home, or get along with other people    Depression  Interventions/Treatment      There were no vitals filed for this visit.  Medications Reviewed Today     Reviewed by Lucian Santana LABOR, RN (Registered Nurse) on 02/19/24 at 1115  Med List Status: <None>   Medication Order Taking? Sig Documenting Provider Last Dose Status Informant  acetaminophen  (TYLENOL ) 650 MG CR tablet [id number]  Take 1,300 mg by mouth daily as needed for pain. [provider]  Active Self  albuterol  (VENTOLIN  HFA) 108 (90 Base) MCG/ACT inhaler [id number] Yes Inhale 2 puffs into the lungs every 6 (six) hours as needed for wheezing or shortness of breath. Neda Jennet LABOR, MD  Active Self  ALLERGY RELIEF CETIRIZINE  10 MG tablet [id number]  TAKE 1 TABLET BY MOUTH ONCE DAILY Shelah Lamar RAMAN, MD  Active Self  BESIVANCE 0.6 % SUSP [id number]  Place 1 drop into the left eye See admin instructions. Instill 1 drop into the left eye 4 times daily the day OF and day AFTER (eye injection) [provider]  Active Self  Budeson-Glycopyrrol-Formoterol  (BREZTRI  AEROSPHERE) 160-9-4.8 MCG/ACT AERO [id number] Yes Inhale 2 puffs into the lungs in the morning and at bedtime. Shelah Lamar RAMAN, MD  Active Self  Cholecalciferol 50 MCG (2000 UT) CAPS [id number]  Take 2,000 Units by mouth daily. [provider]  Active Self  cyanocobalamin  (VITAMIN B12) 1000 MCG tablet [id number]  Take 1,000 mcg by mouth daily. [provider]  Active   diltiazem  (CARDIZEM  CD) 240 MG 24 hr capsule [id number]  TAKE 1 CAPSULE BY MOUTH EVERY MORNING Skains, Oneil BROCKS, MD  Active Self  DULoxetine  (CYMBALTA ) 60 MG capsule [id number]  Take 60 mg by mouth every morning. [provider]  Active Self  ELIQUIS  5 MG TABS tablet [id number] Yes TAKE 1 TABLET (5 MG TOTAL) BY MOUTH 2 (TWO) TIMES DAILY. Jeffrie Oneil BROCKS, MD  Active   flecainide  (TAMBOCOR ) 50 MG tablet [id number]  TAKE 1 TABLET BY MOUTH AT BREAKFAST AND AT BEDTIME Jeffrie Oneil BROCKS, MD  Active Self  fluticasone  (FLONASE ) 50 MCG/ACT nasal spray [id number]  Place 1 spray into both nostrils daily. Byrum, Robert S, MD  Active Self  furosemide  (LASIX ) 40 MG tablet [id number] Yes TAKE 1 TABLET BY MOUTH EVERY MORNING Jeffrie Oneil BROCKS, MD  Active Self           Med Note CARLISLE, ARKANSAS A   Wed Oct 15, 2023  2:28 PM) Can take an extra tablet for ankle/leg/foot swelling up to three days.   ipratropium-albuterol  (DUONEB) 0.5-2.5 (3) MG/3ML SOLN [id number] Yes Take 3 mLs by nebulization every 4 (four) hours as needed (shortness of breath). Byrum, Robert S, MD  Active   LORazepam  (ATIVAN ) 0.5 MG tablet [id number]  Take 1-2 tablets (0.5-1 mg total) by mouth daily as needed for anxiety. Anxiety Amin, Ankit C, MD  Active Self  metoprolol  succinate (TOPROL -XL) 50 MG 24 hr  tablet [id number]  Take 1 tablet (50 mg total) by mouth at bedtime. Take with or immediately following a meal. Jeffrie Oneil BROCKS, MD  Active Self  montelukast  (SINGULAIR ) 10 MG tablet [id number] Yes Take 1 tablet (10 mg total) by mouth at bedtime. Byrum, Robert S, MD  Active   Multiple Vitamins-Minerals (PRESERVISION AREDS 2+MULTI VIT) CAPS [id number]  Take 1 tablet by mouth 2 (two) times daily. [provider]  Active Self  OXYGEN  [id number] Yes Inhale 3 L into the lungs continuous. [provider]  Active Self  pantoprazole  (PROTONIX ) 40 MG tablet [id number]  Take 1 tablet (  40 mg total) by mouth daily. Malachy Comer GAILS, NP  Active Self  pravastatin  (PRAVACHOL ) 40 MG tablet [id number]  Take 40 mg by mouth at bedtime.  [provider]  Active Self            Recommendation:   Reviewed interventions on staying healthy this winter: flu vaccine, hand hygiene, eating healthy foods, staying hydrated,  wearing mask if in public concentrated areas, taking medications/inhalers as ordered/getting timely refills, reporting any fevers/cough/increased fatigue Specialty provider follow-up Pulmonary 03/03/24, she will ask for flu vaccine at this visit.  Follow Up Plan:   Telephone follow-up in 1 month  Santana Stamp BSN, CCM [place]  VBCI Population Health RN Care Manager Direct Dial: [phone]  Fax: [phone]

## 2024-03-03 ENCOUNTER — Encounter: Payer: Self-pay | Admitting: Emergency Medicine

## 2024-03-03 ENCOUNTER — Ambulatory Visit (INDEPENDENT_AMBULATORY_CARE_PROVIDER_SITE_OTHER): Admitting: Emergency Medicine

## 2024-03-03 VITALS — BP 128/60 | HR 85 | Ht 62.0 in | Wt 227.0 lb

## 2024-03-03 DIAGNOSIS — Z23 Encounter for immunization: Secondary | ICD-10-CM | POA: Diagnosis not present

## 2024-03-03 DIAGNOSIS — J301 Allergic rhinitis due to pollen: Secondary | ICD-10-CM

## 2024-03-03 DIAGNOSIS — K2100 Gastro-esophageal reflux disease with esophagitis, without bleeding: Secondary | ICD-10-CM | POA: Diagnosis not present

## 2024-03-03 DIAGNOSIS — J9611 Chronic respiratory failure with hypoxia: Secondary | ICD-10-CM

## 2024-03-03 DIAGNOSIS — J438 Other emphysema: Secondary | ICD-10-CM

## 2024-03-03 DIAGNOSIS — R911 Solitary pulmonary nodule: Secondary | ICD-10-CM

## 2024-03-03 MED ORDER — ALBUTEROL SULFATE HFA 108 (90 BASE) MCG/ACT IN AERS: 2.0000 | INHALATION_SPRAY | Freq: Four times a day (QID) | RESPIRATORY_TRACT | 2 refills | Status: AC | PRN

## 2024-03-03 NOTE — Patient Instructions (Signed)
 Please continue your Breztri  2 puffs twice a day.  Rinse and gargle after using. We will complete your [place] support paperwork when it is available later this year Keep albuterol  available to use 2 puffs or 1 nebulizer treatment up to every 4 hours if needed for shortness of breath, chest tightness, wheezing. Flu shot today Agree with getting the COVID-19 vaccine this fall.  Check to see if you have already had the RSV vaccine as well. Continue your Allegra and Singulair  as you have been taking them Agree with using your Flonase  every day on a schedule while you are having increased drainage.  When the allergy symptoms subside you can try decreasing the Flonase  to only as needed Continue your pantoprazole  as you have been taking it Continue your oxygen  at 3 L/min Please follow in our office in 6 months.  Follow Dr. Shelah in about 1 year.  Please call sooner if you have any problems.

## 2024-03-03 NOTE — Assessment & Plan Note (Signed)
 Doing quite well.  She has not had any flares even with some increased allergy symptoms in the last month (which she has treated).  Plan to continue same regimen.  She is benefiting significantly from the Breztri .  She participates in the [place] support program and we will do her paperwork for this later this year when it is due for renewal  Please continue your Breztri  2 puffs twice a day.  Rinse and gargle after using. We will complete your [place] support paperwork when it is available later this year Keep albuterol  available to use 2 puffs or 1 nebulizer treatment up to every 4 hours if needed for shortness of breath, chest tightness, wheezing. Flu shot today Agree with getting the COVID-19 vaccine this fall.  Check to see if you have already had the RSV vaccine as well. Continue your Allegra and Singulair  as you have been taking them Please follow in our office in 6 months.  Follow Dr. Shelah in about 1 year.  Please call sooner if you have any problems.

## 2024-03-03 NOTE — Assessment & Plan Note (Signed)
 Continue oxygen  at 3-liters per minute.  Occasion she has to increase to 4 L/min with exertion.

## 2024-03-03 NOTE — Progress Notes (Unsigned)
 Subjective:    Patient ID: [name] Molina, female    DOB: 04/05/1946, 78 y.o.   MRN: [id number] HPI  ROV 10/01/2023 --[name] Molina is 1 with COPD/fixed asthma, allergic rhinitis, GERD, upper airway irritation syndrome.  Also with a history of hypertension and A-fib with associated diastolic dysfunction.  She has chronic hypoxemic respiratory failure and is on 3 liters per minute She had influenza in February. She feels tired all the time, has had negative PSG in 2022. No chest tightness. She has baseline wheeze. No real cough.  Current regimen includes Breztri , albuterol  which she uses few times a week. Never uses her nebs these days.  Cetirizine , pantoprazole .  Not currently on fluticasone  nasal spray  ROV 03/03/2024 --Follow-up visit for 78 year old woman who has a history of COPD/fixed asthma and upper airway irritation syndrome with chronic recurrent cough.  Contributors include her allergic rhinitis and GERD.  Past medical history also significant for hypertension and A-fib with associated diastolic dysfunction.  She has chronic hypoxemic respiratory failure on 3 L/min Has been managed on Breztri , allegra, singulair , pantoprazole  Today she reports that she has been ok - no hospitalizations or flares. She did have some sore throat beginning about 1 mo ago. She had more drainage. She got on the flonase  daily and this improved. Her GERD has been well managed.  Minimal albuterol  use. Wears her O2 reliably, usually 3L/min, sometimes up to 4L/min w exertion. She needs the flu shot, COVID shot. She is not sure whether she has had RSV.       Objective:   Physical Exam Vitals:   03/03/24 1123  BP: 128/60  Pulse: 85  SpO2: 97%  Weight: 227 lb (103 kg)  Height: 5' 2 (1.575 m)    Gen: Pleasant, overweight woman, in no distress,  normal affect, wheelchair  ENT: No lesions,  mouth clear, focal rounded lesion on the tongue that is not ulcerated, no evidence of thrush  Neck: No JVD, no  stridor  Lungs: No use of accessory muscles, distant, no wheeze  Cardiovascular: RRR, heart sounds normal, no murmur or gallops  Musculoskeletal: no deformities  Neuro: alert, non focal  Skin: Warm and dry w some scaling, no lesions or rashes     Assessment & Plan:  COPD (chronic obstructive pulmonary disease) (HCC) Doing quite well.  She has not had any flares even with some increased allergy symptoms in the last month (which she has treated).  Plan to continue same regimen.  She is benefiting significantly from the Breztri .  She [place] in the [place] support [place] and we will do her paperwork for this later this year when it is due for renewal  Please continue your Breztri  2 puffs twice a day.  Rinse and gargle after using. We will complete your [place] support paperwork when it is available later this year Keep albuterol  available to use 2 puffs or 1 nebulizer treatment up to every 4 hours if needed for shortness of breath, chest tightness, wheezing. Flu shot today Agree with getting the COVID-19 vaccine this fall.  Check to see if you have already had the RSV vaccine as well. Continue your Allegra and Singulair  as you have been taking them Please follow in our office in 6 months.  Follow Dr. Shelah in about 1 year.  Please call sooner if you have any problems.  Allergic rhinitis Has been flaring over the last month.  She will continue her Allegra, Singulair .  I have asked her to continue to use  the Flonase  daily until her symptoms subside.  At that time she can go back to using as needed  GERD (gastroesophageal reflux disease) Well-controlled at this time.  Continue same PPI  Chronic hypoxic respiratory failure (HCC) Continue oxygen  at 3-liters per minute.  Occasion she has to increase to 4 L/min with exertion.   Time spent 30 minutes reviewing her medications, symptoms, plan of care.   Lamar Chris, MD, PhD 03/03/2024, 11:54 AM [facility] [phone]  or if no answer [phone]

## 2024-03-03 NOTE — Assessment & Plan Note (Signed)
 Has been flaring over the last month.  She will continue her Allegra, Singulair .  I have asked her to continue to use the Flonase  daily until her symptoms subside.  At that time she can go back to using as needed

## 2024-03-03 NOTE — Assessment & Plan Note (Signed)
 Well-controlled at this time.  Continue same PPI

## 2024-03-04 ENCOUNTER — Telehealth: Payer: Self-pay

## 2024-03-04 NOTE — Telephone Encounter (Signed)
 Copied from CRM [id number]. Topic: General - Other >> Mar 04, 2024 10:26 AM [name] Molina wrote: Reason for CRM: Patient was told to call back to let Dr. Shelah know that she has had the RSV injection on 02/08/23. She had this at [facility].  Noted. I have updated pt chart.

## 2024-03-22 ENCOUNTER — Other Ambulatory Visit: Payer: Self-pay

## 2024-03-22 NOTE — Patient Instructions (Signed)
 Visit Information  Thank you for taking time to visit with me today. Please don't hesitate to contact me if I can be of assistance to you before our next scheduled appointment.  Your next care management appointment is by telephone on Monday, December 1st at 11am.  Please call the care guide team at [phone] if you need to cancel, schedule, or reschedule an appointment.   A reminder to ALL patients/family/friends, please call the [place]  National Suicide Prevention Lifeline: 470-732-5171 or TTY: 786-200-2343 TTY 832-828-0224) to talk to a trained counselor if you are experiencing a Mental Health or Behavioral Health Crisis or need someone to talk to.  Santana Stamp BSN, CCM [place]  VBCI Population Health RN Care Manager Direct Dial: [phone]  Fax: [phone]

## 2024-03-22 NOTE — Patient Outreach (Signed)
 Complex Care Management   Visit Note  03/22/2024  Name:  [name] Molina MRN: [id number] DOB: [birth year]  Situation: Referral received for Complex Care Management related to COPD I obtained verbal consent from Patient.  Visit completed with [name] Molina  on the phone. Denies concerns today, saw Pulmonary 03/04/24 with no med changes. She is now up-to-date with flu vaccine.  Background:   Past Medical History:  Diagnosis Date   Anxiety    Aortic atherosclerosis    Arthritis    Asthma    Atrial tachycardia    Cancer (HCC)    basal skin cancer on nose   Cataract    Chronic diastolic CHF (congestive heart failure) (HCC)    Complication of anesthesia    during cervical surgery she woke up during the procedure   COPD (chronic obstructive pulmonary disease) (HCC)    Depression    Diverticulosis    Family history of adverse reaction to anesthesia    mother had post op N&V   GERD (gastroesophageal reflux disease)    History of hiatal hernia    Hyperlipidemia    Hypertension    Insomnia    Macular degeneration    Migraines    Neuropathy    Obesity    PAF (paroxysmal atrial fibrillation) (HCC)    Pneumonia    Sleep apnea    history of it,not using a cpap, study 2018: didn't need cpap   Thyroid  nodule 2000   radioactive caspule    Assessment: Patient Reported Symptoms:  Cognitive Cognitive Status: No symptoms reported, Alert and oriented to person, place, and time, Insightful and able to interpret abstract concepts, Normal speech and language skills   Health Maintenance Behaviors: Annual physical exam  Neurological Neurological Review of Symptoms: No symptoms reported    HEENT HEENT Symptoms Reported: No symptoms reported HEENT Management Strategies: Routine screening HEENT Self-Management Outcome: 4 (good) HEENT Comment: Will see Dr. Alvia, Retina specialist 05/04/24 for f/u.    Cardiovascular Cardiovascular Symptoms Reported: No symptoms reported    Respiratory  Respiratory Symptoms Reported: Other: Other Respiratory Symptoms: Breathing is at baseline, continues to use 3L O2 cont, using concentrator, Inogen portable when she goes out.    Endocrine Endocrine Symptoms Reported: Not assessed Is patient diabetic?: No    Gastrointestinal Gastrointestinal Symptoms Reported: No symptoms reported      Genitourinary Genitourinary Symptoms Reported: Frequency, Urgency Additional Genitourinary Details: Takes furosemide  daily which causes urinary frequency. Genitourinary Management Strategies: Incontinence garment/pad Genitourinary Self-Management Outcome: 4 (good)  Integumentary Integumentary Symptoms Reported: No symptoms reported    Musculoskeletal Musculoskelatal Symptoms Reviewed: No symptoms reported Additional Musculoskeletal Details: No falls since 01/18/24 Musculoskeletal Self-Management Outcome: 4 (good)      Psychosocial Psychosocial Symptoms Reported: No symptoms reported          03/22/2024    PHQ2-9 Depression Screening   Little interest or pleasure in doing things    Feeling down, depressed, or hopeless    PHQ-2 - Total Score    Trouble falling or staying asleep, or sleeping too much    Feeling tired or having little energy    Poor appetite or overeating     Feeling bad about yourself - or that you are a failure or have let yourself or your family down    Trouble concentrating on things, such as reading the newspaper or watching television    Moving or speaking so slowly that other people could have noticed.  Or the opposite - being so fidgety  or restless that you have been moving around a lot more than usual    Thoughts that you would be better off dead, or hurting yourself in some way    PHQ2-9 Total Score    If you checked off any problems, how difficult have these problems made it for you to do your work, take care of things at home, or get along with other people    Depression Interventions/Treatment      There were no vitals  filed for this visit.  Medications Reviewed Today   Medications were not reviewed in this encounter     Recommendation:   Discussed upcoming appts: She will call Cardiology to set up her one-year appt.  Specialty provider follow-up :Retina Specialist 05/04/24 Continue Current Plan of Care Patient will take Breztri  and albuterol  as ordered, will call for timely refills, she will continue to rinse and spit after using inhalers to prevent mouth sores.   Follow Up Plan:   Telephone follow-up in 1 month  Santana Stamp BSN, CCM [place]  VBCI Population Health RN Care Manager Direct Dial: [phone]  Fax: [phone]

## 2024-03-31 ENCOUNTER — Other Ambulatory Visit: Payer: Self-pay | Admitting: Cardiology

## 2024-03-31 DIAGNOSIS — I4891 Unspecified atrial fibrillation: Secondary | ICD-10-CM

## 2024-04-02 MED ORDER — FLECAINIDE ACETATE 50 MG PO TABS: 50.0000 mg | ORAL_TABLET | Freq: Two times a day (BID) | ORAL | 0 refills | Status: AC

## 2024-04-02 MED ORDER — METOPROLOL SUCCINATE ER 50 MG PO TB24: 50.0000 mg | ORAL_TABLET | Freq: Every day | ORAL | 11 refills | Status: DC

## 2024-04-02 NOTE — Addendum Note (Signed)
 Addended by: DARIO IZETTA CROME on: 04/02/2024 02:10 PM   Modules accepted: Orders

## 2024-04-08 NOTE — Progress Notes (Signed)
 Cardiology Office Note:  .   Date:  04/20/2024  ID:  [name] Molina, [name] Molina 10-23-1945, MRN [id number] PCP: Teresa Channel, MD  [facility] Providers Cardiologist:  Oneil Parchment, MD Electrophysiologist:  Will Gladis Norton, MD    History of Present Illness: .   [name] Molina is a 78 y.o. female  with a history of paroxysmal atrial fibrillation, severe mitral annular calcification, and COPD,  Patient comes in with her daughter. She is in a wheelchair on O2. She has occasional palpitations and checks HR on Kardia machine but no Afib. HR got up to 130's once. It usually occurs when she's moving around.   ROS:    Studies Reviewed: SABRA    EKG Interpretation Date/Time:  Tuesday April 20 2024 10:38:16 EST Ventricular Rate:  122 PR Interval:  120 QRS Duration:  94 QT Interval:  330 QTC Calculation: 470 R Axis:   27  Text Interpretation: Sinus tachycardia Marked ST abnormality, possible inferolateral subendocardial injury When compared with ECG of 17-Jul-2023 11:40, PREVIOUS ECG IS PRESENT Confirmed by Parthenia Klinefelter [id number]) on 04/20/2024 10:46:08 AM    Prior CV Studies:   Echo 06/2022 IMPRESSIONS     1. Left ventricular ejection fraction, by estimation, is 55 to 60%. The  left ventricle has normal function. The left ventricle has no regional  wall motion abnormalities. There is mild concentric left ventricular  hypertrophy. Left ventricular diastolic  parameters are consistent with Grade II diastolic dysfunction  (pseudonormalization).   2. Right ventricular systolic function is normal. The right ventricular  size is normal. Tricuspid regurgitation signal is inadequate for assessing  PA pressure.   3. Left atrial size was severely dilated.   4. The mitral valve is degenerative. Trivial mitral valve regurgitation.  Mild mitral stenosis. The mean mitral valve gradient is 3.0 mmHg. Moderate  to severe mitral annular calcification.   5. The aortic valve is tricuspid. There is  mild calcification of the  aortic valve. Aortic valve regurgitation is not visualized. Aortic valve  sclerosis is present, with no evidence of aortic valve stenosis.   6. The inferior vena cava is normal in size with greater than 50%  respiratory variability, suggesting right atrial pressure of 3 mmHg.   7. Technically difficult study with poor acoustic windows.    Risk Assessment/Calculations:    CHA2DS2-VASc Score = 4   This indicates a 4.8% annual risk of stroke. The patient's score is based upon: CHF History: 1 HTN History: 0 Diabetes History: 0 Stroke History: 0 Vascular Disease History: 0 Age Score: 2 Gender Score: 1            Physical Exam:   VS:  BP 102/70 (BP Location: Left Arm, Patient Position: Sitting, Cuff Size: Large)   Pulse (!) 122   Ht 5' 2.5 (1.588 m)   BMI 40.86 kg/m    Orhtostatics: No data found. Wt Readings from Last 3 Encounters:  03/03/24 227 lb (103 kg)  07/18/23 217 lb 13 oz (98.8 kg)  05/29/23 223 lb (101.2 kg)    GEN: Obese, in wheelchair, on O2, in no acute distress NECK: No JVD; No carotid bruits CARDIAC:  RRR 120/m, 2/6 systolic murmur LSB RESPIRATORY:  Clear to auscultation without rales, wheezing or rhonchi  ABDOMEN: Soft, non-tender, non-distended EXTREMITIES:  trace edema; No deformity   ASSESSMENT AND PLAN: .    Paroxysmal atrial fibrillation,  Remains in sinus rhythm but tachycardic.SABRA Continues on flecainide  50 mg twice a day, diltiazem  240 mg  daily, Increase Toprol  XL 50 mg at bedtime, 25 mg am, and Eliquis  5 mg twice a day. No recent episodes reported. Discussed the importance of continuing flecainide  to maintain normal rhythm and prevent recurrence. - Continue flecainide  50 mg twice a day - Continue diltiazem  240 mg daily - Continue Toprol  XL 50 mg at bedtime 25 mg in am - Continue Eliquis  5 mg twice a day - Follow up in one year Dr. Jeffrie, 4 months Dr. Judi -check CBC, CMET, TSH   Severe Mitral Annular  Calcification Severe mitral annular calcification noted on cardiogram 06/2022  with an ejection fraction of 70% and a mean mitral valve gradient of 5 mmHg. -repeat echo - Continue current management   Chronic Obstructive Pulmonary Disease (COPD) . No recent hospitalizations or significant exacerbations reported. - Continue current management with pulmonary medicine   HLD On pravastatin  40 mg daily for cholesterol management.   LDL 76 11/2023  Chronic diastolic CHF managed with lasix  Compensated today Update labs          Dispo: see above  Signed, Olivia Pavy, PA-C

## 2024-04-19 ENCOUNTER — Other Ambulatory Visit: Payer: Self-pay

## 2024-04-20 ENCOUNTER — Encounter (HOSPITAL_COMMUNITY): Payer: Self-pay | Admitting: Emergency Medicine

## 2024-04-20 ENCOUNTER — Ambulatory Visit: Admitting: Physician Assistant

## 2024-04-20 ENCOUNTER — Encounter: Payer: Self-pay | Admitting: Physician Assistant

## 2024-04-20 ENCOUNTER — Emergency Department (HOSPITAL_COMMUNITY)

## 2024-04-20 ENCOUNTER — Inpatient Hospital Stay (HOSPITAL_COMMUNITY)
Admission: EM | Admit: 2024-04-20 | Discharge: 2024-04-22 | DRG: 309 | Disposition: A | Attending: Internal Medicine | Admitting: Internal Medicine

## 2024-04-20 ENCOUNTER — Other Ambulatory Visit: Payer: Self-pay

## 2024-04-20 VITALS — BP 102/70 | HR 122 | Ht 62.5 in

## 2024-04-20 DIAGNOSIS — N3000 Acute cystitis without hematuria: Secondary | ICD-10-CM | POA: Diagnosis not present

## 2024-04-20 DIAGNOSIS — E7800 Pure hypercholesterolemia, unspecified: Secondary | ICD-10-CM | POA: Diagnosis not present

## 2024-04-20 DIAGNOSIS — I480 Paroxysmal atrial fibrillation: Secondary | ICD-10-CM | POA: Diagnosis not present

## 2024-04-20 DIAGNOSIS — I3481 Nonrheumatic mitral (valve) annulus calcification: Secondary | ICD-10-CM | POA: Diagnosis not present

## 2024-04-20 DIAGNOSIS — R0602 Shortness of breath: Secondary | ICD-10-CM

## 2024-04-20 DIAGNOSIS — I349 Nonrheumatic mitral valve disorder, unspecified: Secondary | ICD-10-CM | POA: Diagnosis not present

## 2024-04-20 DIAGNOSIS — I059 Rheumatic mitral valve disease, unspecified: Secondary | ICD-10-CM

## 2024-04-20 DIAGNOSIS — I4891 Unspecified atrial fibrillation: Secondary | ICD-10-CM | POA: Diagnosis not present

## 2024-04-20 DIAGNOSIS — R5383 Other fatigue: Secondary | ICD-10-CM | POA: Diagnosis not present

## 2024-04-20 DIAGNOSIS — J438 Other emphysema: Secondary | ICD-10-CM

## 2024-04-20 DIAGNOSIS — R0989 Other specified symptoms and signs involving the circulatory and respiratory systems: Secondary | ICD-10-CM | POA: Diagnosis not present

## 2024-04-20 DIAGNOSIS — J9611 Chronic respiratory failure with hypoxia: Secondary | ICD-10-CM | POA: Diagnosis not present

## 2024-04-20 DIAGNOSIS — I5032 Chronic diastolic (congestive) heart failure: Secondary | ICD-10-CM | POA: Diagnosis present

## 2024-04-20 DIAGNOSIS — J449 Chronic obstructive pulmonary disease, unspecified: Secondary | ICD-10-CM | POA: Diagnosis present

## 2024-04-20 DIAGNOSIS — E785 Hyperlipidemia, unspecified: Secondary | ICD-10-CM | POA: Diagnosis present

## 2024-04-20 LAB — URINALYSIS, W/ REFLEX TO CULTURE (INFECTION SUSPECTED)
Bilirubin Urine: NEGATIVE
Glucose, UA: NEGATIVE mg/dL
Hgb urine dipstick: NEGATIVE
Ketones, ur: NEGATIVE mg/dL
Nitrite: NEGATIVE
Protein, ur: 30 mg/dL — AB
Specific Gravity, Urine: 1.036 — ABNORMAL HIGH (ref 1.005–1.030)
WBC, UA: >50 WBC/hpf (ref 0–5)
pH: 5 (ref 5.0–8.0)

## 2024-04-20 LAB — MAGNESIUM: Magnesium: 2 mg/dL (ref 1.7–2.4)

## 2024-04-20 LAB — HEPATIC FUNCTION PANEL
ALT: 12 U/L (ref 0–44)
AST: 17 U/L (ref 15–41)
Albumin: 3.4 g/dL — ABNORMAL LOW (ref 3.5–5.0)
Alkaline Phosphatase: 81 U/L (ref 38–126)
Bilirubin, Direct: <0.1 mg/dL (ref 0.0–0.2)
Total Bilirubin: 0.5 mg/dL (ref 0.0–1.2)
Total Protein: 6.3 g/dL — ABNORMAL LOW (ref 6.5–8.1)

## 2024-04-20 LAB — CBC
HCT: 42.6 % (ref 36.0–46.0)
Hemoglobin: 13.3 g/dL (ref 12.0–15.0)
MCH: 27.1 pg (ref 26.0–34.0)
MCHC: 31.2 g/dL (ref 30.0–36.0)
MCV: 86.8 fL (ref 80.0–100.0)
Platelets: 258 K/uL (ref 150–400)
RBC: 4.91 MIL/uL (ref 3.87–5.11)
RDW: 14.8 % (ref 11.5–15.5)
WBC: 8.6 K/uL (ref 4.0–10.5)
nRBC: 0 % (ref 0.0–0.2)

## 2024-04-20 LAB — BASIC METABOLIC PANEL WITH GFR
Anion gap: 7 (ref 5–15)
BUN: 22 mg/dL (ref 8–23)
CO2: 25 mmol/L (ref 22–32)
Calcium: 8.2 mg/dL — ABNORMAL LOW (ref 8.9–10.3)
Chloride: 109 mmol/L (ref 98–111)
Creatinine, Ser: 1.17 mg/dL — ABNORMAL HIGH (ref 0.44–1.00)
GFR, Estimated: 47 mL/min — ABNORMAL LOW (ref >60)
Glucose, Bld: 128 mg/dL — ABNORMAL HIGH (ref 70–99)
Potassium: 3.9 mmol/L (ref 3.5–5.1)
Sodium: 141 mmol/L (ref 135–145)

## 2024-04-20 LAB — RESP PANEL BY RT-PCR (RSV, FLU A&B, COVID)  RVPGX2
Influenza A by PCR: NEGATIVE
Influenza B by PCR: NEGATIVE
Resp Syncytial Virus by PCR: NEGATIVE
SARS Coronavirus 2 by RT PCR: NEGATIVE

## 2024-04-20 LAB — TROPONIN I (HIGH SENSITIVITY)
Troponin I (High Sensitivity): 18 ng/L — ABNORMAL HIGH (ref <18)
Troponin I (High Sensitivity): 18 ng/L — ABNORMAL HIGH (ref <18)

## 2024-04-20 LAB — BRAIN NATRIURETIC PEPTIDE: B Natriuretic Peptide: 112.6 pg/mL — ABNORMAL HIGH (ref 0.0–100.0)

## 2024-04-20 LAB — TSH: TSH: 3.984 u[IU]/mL (ref 0.350–4.500)

## 2024-04-20 MED ORDER — METOPROLOL SUCCINATE ER 50 MG PO TB24: ORAL_TABLET | ORAL | 0 refills | Status: DC

## 2024-04-20 MED ORDER — DILTIAZEM HCL-DEXTROSE 125-5 MG/125ML-% IV SOLN (PREMIX)
5.0000 mg/h | INTRAVENOUS | Status: DC
  Administered 2024-04-20: 5 mg/h via INTRAVENOUS
  Administered 2024-04-21: 15 mg/h via INTRAVENOUS
  Administered 2024-04-22: 10 mg/h via INTRAVENOUS
  Filled 2024-04-20 (×4): qty 125

## 2024-04-20 MED ORDER — METOPROLOL SUCCINATE ER 50 MG PO TB24: ORAL_TABLET | ORAL | 3 refills | Status: AC

## 2024-04-20 NOTE — ED Triage Notes (Signed)
 BIB [facility] from home after f/u with cardiology. HR was 120s at dr's office. After returning home pt was not feeling well and had son check BP and it was 82/60. EMS reports initial of 98/70. EMS reports afib rvr weith rates of approx 160, mild SOB, but denies CP, N/v, LOC, Dizziness. Received 500 mL of NS with improvement of BP with fluids. Pt wears 3lpm via [place] at baseline.   18 g rt wrist 112/68 HR 140s Spo2 97% 3 LPM [place]

## 2024-04-20 NOTE — Patient Outreach (Signed)
 Complex Care Management   Visit Note  04/20/2024  Name:  [name] Molina MRN: [id number] DOB: [birth year]  Situation: Referral received for Complex Care Management related to Emphysema. I obtained verbal consent from Patient.  Visit completed with [name] Molina  on the phone.  Denies concerns today.  No problems with medications, no issues with refills, taking meds as prescribed.  She has declined any further Mammograms, Colonoscopy, due to her age (over 36) and unremarkable results for last testing for both, she has discussed this with her PCP.    Background:   Past Medical History:  Diagnosis Date   Anxiety    Aortic atherosclerosis    Arthritis    Asthma    Atrial tachycardia    Cancer (HCC)    basal skin cancer on nose   Cataract    Chronic diastolic CHF (congestive heart failure) (HCC)    Complication of anesthesia    during cervical surgery she woke up during the procedure   COPD (chronic obstructive pulmonary disease) (HCC)    Depression    Diverticulosis    Family history of adverse reaction to anesthesia    mother had post op N&V   GERD (gastroesophageal reflux disease)    History of hiatal hernia    Hyperlipidemia    Hypertension    Insomnia    Macular degeneration    Migraines    Neuropathy    Obesity    PAF (paroxysmal atrial fibrillation) (HCC)    Pneumonia    Sleep apnea    history of it,not using a cpap, study 2018: didn't need cpap   Thyroid  nodule 2000   radioactive caspule    Assessment: Patient Reported Symptoms:  Cognitive Cognitive Status: No symptoms reported, Alert and oriented to person, place, and time, Insightful and able to interpret abstract concepts, Normal speech and language skills      Neurological Neurological Review of Symptoms: No symptoms reported    HEENT HEENT Symptoms Reported: No symptoms reported      Cardiovascular Cardiovascular Symptoms Reported: No symptoms reported    Respiratory Respiratory Symptoms Reported:  Other: Other Respiratory Symptoms: Breathing is at baseline.    Endocrine Endocrine Symptoms Reported: Not assessed Is patient diabetic?: No    Gastrointestinal Gastrointestinal Symptoms Reported: No symptoms reported      Genitourinary Genitourinary Symptoms Reported: Frequency, Urgency Additional Genitourinary Details: Frequency/urgency at baseline    Integumentary Integumentary Symptoms Reported: No symptoms reported    Musculoskeletal Musculoskelatal Symptoms Reviewed: No symptoms reported Musculoskeletal Comment: No falls since 01/18/24      Psychosocial Psychosocial Symptoms Reported: No symptoms reported          04/20/2024    PHQ2-9 Depression Screening   Little interest or pleasure in doing things    Feeling down, depressed, or hopeless    PHQ-2 - Total Score    Trouble falling or staying asleep, or sleeping too much    Feeling tired or having little energy    Poor appetite or overeating     Feeling bad about yourself - or that you are a failure or have let yourself or your family down    Trouble concentrating on things, such as reading the newspaper or watching television    Moving or speaking so slowly that other people could have noticed.  Or the opposite - being so fidgety or restless that you have been moving around a lot more than usual    Thoughts that you would be better off dead,  or hurting yourself in some way    PHQ2-9 Total Score    If you checked off any problems, how difficult have these problems made it for you to do your work, take care of things at home, or get along with other people    Depression Interventions/Treatment      There were no vitals filed for this visit. Pain Scale: 0-10 Pain Score: 0-No pain  Medications Reviewed Today   Medications were not reviewed in this encounter     Recommendation:   Specialty provider follow-up : Cardiology 04/20/24; Retina 05/04/24; Pulmonology 07/22/2024 Continue Current Plan of Care Patient will  schedule time with her pharmacy to receive Tetanus and updated COVID vaccine  Follow Up Plan:   Telephone follow-up in 1 month  Santana Stamp BSN, CCM [place]  VBCI Population Health RN Care Manager Direct Dial: [phone]  Fax: [phone]

## 2024-04-20 NOTE — Consult Note (Signed)
 Cardiology Consultation   Patient ID: ONESTI BONFIGLIO MRN: [id number]; DOB: 1946/01/22  Admit date: 04/20/2024 Date of Consult: 04/20/2024  PCP:  Teresa Channel, MD    [facility]Care Providers Cardiologist:  Oneil Parchment, MD  Electrophysiologist:  Soyla Gladis Norton, MD       Patient Profile: [name] Molina is a 78 y.o. female with a hx of paroxysmal atrial fibrillation, congestive heart failure, COPD on home oxygen , severe mitral annular calcification and obesity who is being seen 04/20/2024 for the evaluation of atrial fibrillation at the request of the emergency department.  History of Present Illness: [name] Molina saw her primary cardiologist earlier today.  The patient was in a rapid heartbeat, however the patient did not feel any symptoms.  Later on in the evening, the patient was sleepy.  She states she noted her heart rates were fast on her pulse oximeter.  This prompted an ER visit.  Patient denies chest pain, syncope, shortness of breath, dizziness.  Patient states she has a history of atrial fibrillation for at least the past couple of years.  She states she does not feel whenever she is in atrial fibrillation. She is on rhythm control with flecainide  50 mg twice daily, as well as rate control with diltiazem  240 mg daily, and metoprolol  XL 50 mg nightly.  She is on Eliquis  for stroke reduction.  She states she has not missed a dose in at least a month.  Transthoracic echocardiogram on 07/05/2022 shows a normal EF, normal RV function, reveal mitral stenosis, severely enlarged left atrium. She has never underwent direct-current cardioversion or catheter ablation.  She follows up with electrophysiologist Dr. Norton.  Patient's other pertinent medical issues are COPD.  She is on 3 L supplemental oxygen  at rest, which she has been on for several years.  Occasionally she has to bump her oxygen  level up to 4 L.  She states she has not had to do that recently.  She is on maintenance  inhalers.  She patiently has to use her rescue inhaler.  She does not wear CPAP machine.     Past Medical History:  Diagnosis Date   Anxiety    Aortic atherosclerosis    Arthritis    Asthma    Atrial tachycardia    Cancer (HCC)    basal skin cancer on nose   Cataract    Chronic diastolic CHF (congestive heart failure) (HCC)    Complication of anesthesia    during cervical surgery she woke up during the procedure   COPD (chronic obstructive pulmonary disease) (HCC)    Depression    Diverticulosis    Family history of adverse reaction to anesthesia    mother had post op N&V   GERD (gastroesophageal reflux disease)    History of hiatal hernia    Hyperlipidemia    Hypertension    Insomnia    Macular degeneration    Migraines    Neuropathy    Obesity    PAF (paroxysmal atrial fibrillation) (HCC)    Pneumonia    Sleep apnea    history of it,not using a cpap, study 2018: didn't need cpap   Thyroid  nodule 2000   radioactive caspule    Past Surgical History:  Procedure Laterality Date   ANTERIOR CERVICAL DECOMP/DISCECTOMY FUSION N/A 06/15/2020   Procedure: ANTERIOR CERVICAL DECOMPRESSION FUSION CERVICAL FIVE-CERVICAL SIX WITH INSTRUMENTATION AND ALLOGRAFT;  Surgeon: Beuford Oneil, MD;  Location: [facility] OR;  Service: Orthopedics;  Laterality: N/A;  anterior   CERVICAL  POLYPECTOMY     INSIDE AND OUTSIDE   COLONOSCOPY     EYE SURGERY Bilateral    cataracts   POLYPECTOMY     REMOVED CARTLIAGE RIB       Home Medications:  Prior to Admission medications   Medication Sig Start Date End Date Taking? Authorizing Provider  acetaminophen  (TYLENOL ) 650 MG CR tablet Take 1,300 mg by mouth daily as needed for pain.    [provider]  albuterol  (VENTOLIN  HFA) 108 (90 Base) MCG/ACT inhaler Inhale 2 puffs into the lungs every 6 (six) hours as needed for wheezing or shortness of breath. 03/03/24   Shelah Lamar RAMAN, MD  BESIVANCE 0.6 % SUSP Place 1 drop into the left eye See admin  instructions. Instill 1 drop into the left eye 4 times daily the day OF and day AFTER (eye injection) 09/07/19   [provider]  Budeson-Glycopyrrol-Formoterol  (BREZTRI  AEROSPHERE) 160-9-4.8 MCG/ACT AERO Inhale 2 puffs into the lungs in the morning and at bedtime. 06/02/23   Shelah Lamar RAMAN, MD  Cholecalciferol 50 MCG (2000 UT) CAPS Take 2,000 Units by mouth daily.    [provider]  cyanocobalamin  (VITAMIN B12) 1000 MCG tablet Take 1,000 mcg by mouth daily.    [provider]  diltiazem  (CARDIZEM  CD) 240 MG 24 hr capsule TAKE 1 CAPSULE BY MOUTH EVERY MORNING 06/09/23   Jeffrie Oneil BROCKS, MD  DULoxetine  (CYMBALTA ) 60 MG capsule Take 60 mg by mouth every morning. 03/23/20   [provider]  ELIQUIS  5 MG TABS tablet TAKE 1 TABLET (5 MG TOTAL) BY MOUTH 2 (TWO) TIMES DAILY. 02/03/24   Jeffrie Oneil BROCKS, MD  flecainide  (TAMBOCOR ) 50 MG tablet Take 1 tablet (50 mg total) by mouth 2 (two) times daily. 04/02/24   Jeffrie Oneil BROCKS, MD  fluticasone  (FLONASE ) 50 MCG/ACT nasal spray Place 1 spray into both nostrils daily. 07/19/16   Shelah Lamar RAMAN, MD  furosemide  (LASIX ) 40 MG tablet TAKE 1 TABLET BY MOUTH EVERY MORNING 06/09/23   Jeffrie Oneil BROCKS, MD  ipratropium-albuterol  (DUONEB) 0.5-2.5 (3) MG/3ML SOLN Take 3 mLs by nebulization every 4 (four) hours as needed (shortness of breath). 07/22/23   Byrum, Robert S, MD  LORazepam  (ATIVAN ) 0.5 MG tablet Take 1-2 tablets (0.5-1 mg total) by mouth daily as needed for anxiety. Anxiety 03/15/21   Amin, Ankit C, MD  metoprolol  succinate (TOPROL -XL) 50 MG 24 hr tablet TAKE 25 MG (1/2 TABLET) IN THE MORNING AND 50 MG (1 WHOLE TABLET) AT NIGHT. 04/20/24   Parthenia Olivia HERO, PA-C  montelukast  (SINGULAIR ) 10 MG tablet Take 1 tablet (10 mg total) by mouth at bedtime. 11/03/23   Shelah Lamar RAMAN, MD  Multiple Vitamins-Minerals (PRESERVISION AREDS 2+MULTI VIT) CAPS Take 1 tablet by mouth 2 (two) times daily.    [provider]  omeprazole (PRILOSEC) 40 MG  capsule Take 40 mg by mouth every morning.    [provider]  OXYGEN  Inhale 3 L into the lungs continuous.    [provider]  pravastatin  (PRAVACHOL ) 40 MG tablet Take 40 mg by mouth at bedtime.  09/04/10   [provider]    Scheduled Meds:  Continuous Infusions:  diltiazem  (CARDIZEM ) infusion 15 mg/hr (04/20/24 2251)   PRN Meds:   Allergies:    Allergies  Allergen Reactions   Food Shortness Of Breath    ALLERGY= MUSHROOMS   Levaquin  [Levofloxacin  In D5w] Other (See Comments)    made me want to die.   Tequin Shortness  Of Breath   Erythromycin Other (See Comments)    GI UPSET   Oxycodone -Acetaminophen  Itching   Codeine Nausea And Vomiting   Penicillins Rash and Other (See Comments)    Reaction occurred during childhood   Prednisone  Rash   Wellbutrin [Bupropion Hcl] Other (See Comments)    SHAKING    Social History:   Social History   Socioeconomic History   Marital status: Divorced    Spouse name: Not on file   Number of children: 3   Years of education: College   Highest education level: Not on file  Occupational History   Occupation: Psychologist, [employer]  Tobacco Use   Smoking status: Former    Current packs/day: 0.00    Average packs/day: 1 pack/day for 20.0 years (20.0 ttl pk-yrs)    Types: Cigarettes    Start date: 05/21/1971    Quit date: 05/21/1991    Years since quitting: 32.9   Smokeless tobacco: Never  Vaping Use   Vaping status: Never Used  Substance and Sexual Activity   Alcohol use: No   Drug use: No   Sexual activity: Not on file  Other Topics Concern   Not on file  Social History Narrative   Patient is divorced with 3 children.   Patient is right handed.   Patient has a college education.   Patient drinks 1-2 servings daily.   Social Drivers of Corporate Investment Banker Strain: Not on file  Food Insecurity: No Food Insecurity (07/17/2023)   Hunger Vital Sign    Worried About Running Out of Food in the Last  Year: Never true    Ran Out of Food in the Last Year: Never true  Transportation Needs: No Transportation Needs (10/15/2023)   PRAPARE - Administrator, Civil Service (Medical): No    Lack of Transportation (Non-Medical): No  Physical Activity: Not on file  Stress: Not on file  Social Connections: Socially Isolated (07/17/2023)   Social Connection and Isolation Panel    Frequency of Communication with Friends and Family: More than three times a week    Frequency of Social Gatherings with Friends and Family: More than three times a week    Attends Religious Services: Never    Database Administrator or Organizations: No    Attends Banker Meetings: Never    Marital Status: Divorced  Catering Manager Violence: Not At Risk (10/15/2023)   Humiliation, Afraid, Rape, and Kick questionnaire    Fear of Current or Ex-Partner: No    Emotionally Abused: No    Physically Abused: No    Sexually Abused: No    Family History:    Family History  Problem Relation Age of Onset   Colon polyps Sister 44       PRECANCEROUS POLYPS   Breast cancer Maternal Grandmother 58   Emphysema Father    Asthma Father    Heart disease Father    Heart disease Paternal Grandfather    Colon cancer Neg Hx      ROS:  Please see the history of present illness.   All other ROS reviewed and negative.     Physical Exam/Data: Vitals:   04/20/24 2200 04/20/24 2215 04/20/24 2230 04/20/24 2245  BP: 120/88 122/86 (!) 121/107 119/83  Pulse: (!) 139 (!) 136 (!) 139 (!) 138  Resp: 16 14 17 19   Temp:      TempSrc:      SpO2: 98% 99% 98% 98%  Weight:  Height:        Intake/Output Summary (Last 24 hours) at 04/20/2024 2306 Last data filed at 04/20/2024 2251 Gross per 24 hour  Intake 10.48 ml  Output --  Net 10.48 ml      04/20/2024    7:57 PM 04/20/2024   10:30 AM 03/03/2024   11:23 AM  Last 3 Weights  Weight (lbs) 227 lb 1.2 oz -- 227 lb  Weight (kg) 103 kg -- 102.967 kg     Body  mass index is 40.87 kg/m.  General:  Well nourished, well developed, in no acute distress HEENT: normal Neck: difficult to assess JVD Vascular: No carotid bruits; Distal pulses 2+ bilaterally Cardiac:  irregular rate, rhythm Lungs:  clear to auscultation bilaterally, no wheezing, rhonchi or rales  Abd: soft, nontender, no hepatomegaly  Ext: no edema Musculoskeletal:  No deformities, BUE and BLE strength normal and equal Skin: warm and dry  Neuro:  CNs 2-12 intact, no focal abnormalities noted Psych:  Normal affect   EKG:  The EKG was personally reviewed and demonstrates:  atrial fibrillation Telemetry:  Telemetry was personally reviewed and demonstrates:  AF w/ RVR  Relevant CV Studies:  reviewed   Laboratory Data: High Sensitivity Troponin:   Recent Labs  Lab 04/20/24 2018 04/20/24 2215  TROPONINIHS 18* 18*     Chemistry Recent Labs  Lab 04/20/24 1132 04/20/24 2018  NA WILL FOLLOW 141  K WILL FOLLOW 3.9  CL WILL FOLLOW 109  CO2 WILL FOLLOW 25  GLUCOSE WILL FOLLOW 128*  BUN WILL FOLLOW 22  CREATININE WILL FOLLOW 1.17*  CALCIUM WILL FOLLOW 8.2*  MG  --  2.0  GFRNONAA  --  47*  ANIONGAP  --  7    Recent Labs  Lab 04/20/24 1132 04/20/24 2018  PROT WILL FOLLOW 6.3*  ALBUMIN WILL FOLLOW 3.4*  AST WILL FOLLOW 17  ALT WILL FOLLOW 12  ALKPHOS WILL FOLLOW 81  BILITOT WILL FOLLOW 0.5   Lipids No results for input(s): CHOL, TRIG, HDL, LABVLDL, LDLCALC, CHOLHDL in the last 168 hours.  Hematology Recent Labs  Lab 04/20/24 1132 04/20/24 2018  WBC 8.1 8.6  RBC 4.95 4.91  HGB 13.4 13.3  HCT 42.8 42.6  MCV 87 86.8  MCH 27.1 27.1  MCHC 31.3* 31.2  RDW 13.6 14.8  PLT 244 258   Thyroid   Recent Labs  Lab 04/20/24 2018  TSH 3.984    BNP Recent Labs  Lab 04/20/24 2018  BNP 112.6*    DDimer No results for input(s): DDIMER in the last 168 hours.  Radiology/Studies:  DG Chest Portable 1 View Result Date: 04/20/2024 CLINICAL DATA:  SOB,  Afib rvr, fatigue EXAM: PORTABLE CHEST - 1 VIEW COMPARISON:  07/29/2023 FINDINGS: Low lung volumes with streaky bibasilar atelectasis. No focal airspace consolidation, pleural effusion, or pneumothorax. Mild cardiomegaly. Tortuous aorta with aortic atherosclerosis. No acute fracture or destructive lesions. Multilevel thoracic osteophytosis. IMPRESSION: Mild cardiomegaly. No pneumonia or pulmonary edema. Electronically Signed   By: Rogelia Myers M.D.   On: 04/20/2024 21:32     Assessment and Plan:  [name] Molina is a 78 y.o. female with a hx of paroxysmal atrial fibrillation, congestive heart failure, COPD on home oxygen , severe mitral annular calcification and obesity who is admitted for atrial fibrillation with RVR.  Unclear what precipitated patient's atrial fibrillation.  She is mostly asymptomatic, bulimic on exam and her AF burden is unclear.  She has mild chronic myocardial injury likely in the setting of  atrial fibrillation.  Her risk factors for progression of A-fib include advanced age, severe lung disease with supplemental oxygen , severely dilated left atrium, and obesity.  She has been adherent with her Eliquis  for at least the past month.  Recommendations - N.p.o. at midnight for DCCV - Continue Eliquis  (nightly dose ordered) - TTE while inpatient - Continue diltiazem  drip (goal HR <150) - Continue home metoprolol  XL - Hold oral diltiazem  - Continue oral flecainide  - goal K> 4, Mg>2 - will need to go home with 7 day holter monitor - follow up with EP as outpatient   Risk Assessment/Risk Scores:    TIMI Risk Score for Unstable Angina or Non-ST Elevation MI:   The patient's TIMI risk score is  , which indicates a  % risk of all cause mortality, new or recurrent myocardial infarction or need for urgent revascularization in the next 14 days.    CHA2DS2-VASc Score = 4   This indicates a 4.8% annual risk of stroke. The patient's score is based upon: CHF History: 1 HTN  History: 0 Diabetes History: 0 Stroke History: 0 Vascular Disease History: 0 Age Score: 2 Gender Score: 1        For questions or updates, please contact [facility] Please consult [email or web address] for contact info under      Signed, Florentino Laabs A Mykell Genao, MD  04/20/2024 11:06 PM

## 2024-04-20 NOTE — Patient Instructions (Signed)
 Visit Information  Thank you for taking time to visit with me today. Please don't hesitate to contact me if I can be of assistance to you before our next scheduled appointment.  Your next care management appointment is by telephone on Monday, December 29th at 11:00am.  Please call the care guide team at [phone] if you need to cancel, schedule, or reschedule an appointment.   Please call the [place]  National Suicide Prevention Lifeline: 223 750 4569 or TTY: 615-649-7181 TTY [phone]) to talk to a trained counselor if you are experiencing a Mental Health or Behavioral Health Crisis or need someone to talk to.  Santana Stamp BSN, CCM [place]  VBCI Population Health RN Care Manager Direct Dial: [phone]  Fax: [phone]

## 2024-04-20 NOTE — Patient Instructions (Signed)
 Medication Instructions:  INCREASE YOUR METOPROLOL  25 MG (1/2 TABLET) IN THE MORNING AND  50 MG (1 WHOLE TABLET) AT NIGHT.  Lab Work: CMET, CBC, AND TSH TPO BE DONE TODAY.  Testing/Procedures: Your physician has requested that you have an echocardiogram. Echocardiography is a painless test that uses sound waves to create images of your heart. It provides your doctor with information about the size and shape of your heart and how well your heart's chambers and valves are working. This procedure takes approximately one hour. There are no restrictions for this procedure. Please do NOT wear cologne, perfume, aftershave, or lotions (deodorant is allowed). Please arrive 15 minutes prior to your appointment time.  Please note: We ask at that you not bring children with you during ultrasound (echo/ vascular) testing. Due to room size and safety concerns, children are not allowed in the ultrasound rooms during exams. Our front office staff cannot provide observation of children in our lobby area while testing is being conducted. An adult accompanying a patient to their appointment will only be allowed in the ultrasound room at the discretion of the ultrasound technician under special circumstances. We apologize for any inconvenience.   Follow-Up: At [facility], you and your health needs are our priority.  As part of our continuing mission to provide you with exceptional heart care, our providers are all part of one team.  This team includes your primary Cardiologist (physician) and Advanced Practice Providers or APPs (Physician Assistants and Nurse Practitioners) who all work together to provide you with the care you need, when you need it.  Your next appointment:   1 YEAR WITH DR. JEFFRIE NEXT AVAILABLE WITH DR. INOCENCIO  Provider:   Oneil Jeffrie, MD    We recommend signing up for the patient portal called MyChart.  Sign up information is provided on this After Visit Summary.  MyChart is  used to connect with patients for Virtual Visits (Telemedicine).  Patients are able to view lab/test results, encounter notes, upcoming appointments, etc.  Non-urgent messages can be sent to your provider as well.   To learn more about what you can do with MyChart, go to [email or web address].   Other Instructions: PLEASE KEEP A RECORD OF YOUR HEART RATE AND SEND TO DR. INOCENCIO IN 2 WEEKS.

## 2024-04-20 NOTE — ED Provider Notes (Signed)
 [place] EMERGENCY DEPARTMENT AT [facility] Provider Note   CSN: [id number] Arrival date & time: 04/20/24  1949     Patient presents with: A-fib with RVR   [name] Molina is a 78 y.o. female.  {Add pertinent medical, surgical, social history, OB history to [id number]} The history is provided by the patient and medical records. No language interpreter was used.       Prior to Admission medications   Medication Sig Start Date End Date Taking? Authorizing Provider  acetaminophen  (TYLENOL ) 650 MG CR tablet Take 1,300 mg by mouth daily as needed for pain.    [provider]  albuterol  (VENTOLIN  HFA) 108 (90 Base) MCG/ACT inhaler Inhale 2 puffs into the lungs every 6 (six) hours as needed for wheezing or shortness of breath. 03/03/24   Shelah Lamar RAMAN, MD  BESIVANCE 0.6 % SUSP Place 1 drop into the left eye See admin instructions. Instill 1 drop into the left eye 4 times daily the day OF and day AFTER (eye injection) 09/07/19   [provider]  Budeson-Glycopyrrol-Formoterol  (BREZTRI  AEROSPHERE) 160-9-4.8 MCG/ACT AERO Inhale 2 puffs into the lungs in the morning and at bedtime. 06/02/23   Shelah Lamar RAMAN, MD  Cholecalciferol 50 MCG (2000 UT) CAPS Take 2,000 Units by mouth daily.    [provider]  cyanocobalamin  (VITAMIN B12) 1000 MCG tablet Take 1,000 mcg by mouth daily.    [provider]  diltiazem  (CARDIZEM  CD) 240 MG 24 hr capsule TAKE 1 CAPSULE BY MOUTH EVERY MORNING 06/09/23   Jeffrie Oneil BROCKS, MD  DULoxetine  (CYMBALTA ) 60 MG capsule Take 60 mg by mouth every morning. 03/23/20   [provider]  ELIQUIS  5 MG TABS tablet TAKE 1 TABLET (5 MG TOTAL) BY MOUTH 2 (TWO) TIMES DAILY. 02/03/24   Jeffrie Oneil BROCKS, MD  flecainide  (TAMBOCOR ) 50 MG tablet Take 1 tablet (50 mg total) by mouth 2 (two) times daily. 04/02/24   Jeffrie Oneil BROCKS, MD  fluticasone  (FLONASE ) 50 MCG/ACT nasal spray Place 1 spray into both nostrils daily. 07/19/16   Shelah Lamar RAMAN, MD  furosemide  (LASIX ) 40 MG tablet TAKE 1 TABLET BY MOUTH EVERY MORNING 06/09/23   Jeffrie Oneil BROCKS, MD  ipratropium-albuterol  (DUONEB) 0.5-2.5 (3) MG/3ML SOLN Take 3 mLs by nebulization every 4 (four) hours as needed (shortness of breath). 07/22/23   Byrum, Robert S, MD  LORazepam  (ATIVAN ) 0.5 MG tablet Take 1-2 tablets (0.5-1 mg total) by mouth daily as needed for anxiety. Anxiety 03/15/21   Amin, Ankit C, MD  metoprolol  succinate (TOPROL -XL) 50 MG 24 hr tablet TAKE 25 MG (1/2 TABLET) IN THE MORNING AND 50 MG (1 WHOLE TABLET) AT NIGHT. 04/20/24   Parthenia Olivia CHRISTELLA, PA-C  montelukast  (SINGULAIR ) 10 MG tablet Take 1 tablet (10 mg total) by mouth at bedtime. 11/03/23   Shelah Lamar RAMAN, MD  Multiple Vitamins-Minerals (PRESERVISION AREDS 2+MULTI VIT) CAPS Take 1 tablet by mouth 2 (two) times daily.    [provider]  omeprazole (PRILOSEC) 40 MG capsule Take 40 mg by mouth every morning.    [provider]  OXYGEN  Inhale 3 L into the lungs continuous.    [provider]  pravastatin  (PRAVACHOL ) 40 MG tablet Take 40 mg by mouth at bedtime.  09/04/10   [provider]    Allergies: Food, Levaquin  [levofloxacin  in d5w], Tequin, Erythromycin, Oxycodone -acetaminophen , Codeine, Penicillins, Prednisone , and Wellbutrin [bupropion hcl]    Review of Systems  Updated Vital Signs BP 121/76  Pulse (!) 148   Temp 97.6 F (36.4 C) (Oral)   Resp 20   Ht 5' 2.5 (1.588 m)   Wt 103 kg   SpO2 99%   BMI 40.87 kg/m   Physical Exam  (all labs ordered are listed, but only abnormal results are displayed) Labs Reviewed  BASIC METABOLIC PANEL WITH GFR - Abnormal; Notable for the following components:      Result Value   Glucose, Bld 128 (*)    Creatinine, Ser 1.17 (*)    Calcium 8.2 (*)    GFR, Estimated 47 (*)    All other components within normal limits  HEPATIC FUNCTION PANEL - Abnormal; Notable for the following components:   Total Protein 6.3 (*)    Albumin 3.4 (*)     All other components within normal limits  BRAIN NATRIURETIC PEPTIDE - Abnormal; Notable for the following components:   B Natriuretic Peptide 112.6 (*)    All other components within normal limits  URINALYSIS, W/ REFLEX TO CULTURE (INFECTION SUSPECTED) - Abnormal; Notable for the following components:   Color, Urine AMBER (*)    APPearance CLOUDY (*)    Specific Gravity, Urine 1.036 (*)    Protein, ur 30 (*)    Leukocytes,Ua MODERATE (*)    Bacteria, UA RARE (*)    All other components within normal limits  TROPONIN I (HIGH SENSITIVITY) - Abnormal; Notable for the following components:   Troponin I (High Sensitivity) 18 (*)    All other components within normal limits  RESP PANEL BY RT-PCR (RSV, FLU A&B, COVID)  RVPGX2  CBC  MAGNESIUM   TSH  TROPONIN I (HIGH SENSITIVITY)    EKG: EKG Interpretation Date/Time:  Tuesday April 20 2024 20:06:23 EST Ventricular Rate:  137 PR Interval:    QRS Duration:  100 QT Interval:  284 QTC Calculation: 429 R Axis:   22  Text Interpretation: Atrial fibrillation Probable posterior infarct, recent Lateral leads are also involved when compared to prior, similar afib with RVR No STEMI Confirmed by Ginger Barefoot ([id number]) on 04/20/2024 8:57:00 PM  Radiology: DG Chest Portable 1 View Result Date: 04/20/2024 CLINICAL DATA:  SOB, Afib rvr, fatigue EXAM: PORTABLE CHEST - 1 VIEW COMPARISON:  07/29/2023 FINDINGS: Low lung volumes with streaky bibasilar atelectasis. No focal airspace consolidation, pleural effusion, or pneumothorax. Mild cardiomegaly. Tortuous aorta with aortic atherosclerosis. No acute fracture or destructive lesions. Multilevel thoracic osteophytosis. IMPRESSION: Mild cardiomegaly. No pneumonia or pulmonary edema. Electronically Signed   By: Rogelia Myers M.D.   On: 04/20/2024 21:32    {Document cardiac monitor, telemetry assessment procedure when appropriate:32947} Procedures   Medications Ordered in the ED  diltiazem  (CARDIZEM )  125 mg in dextrose  5% 125 mL (1 mg/mL) infusion (15 mg/hr Intravenous Infusion Verify 04/20/24 2251)      {Click here for ABCD2, HEART and other calculators REFRESH Note before signing:1}                              Medical Decision Making Amount and/or Complexity of Data Reviewed Labs: ordered. Radiology: ordered.  Risk Prescription drug management. Decision regarding hospitalization.    KARNE OZGA is a 78 y.o. female with a past medical history significant for COPD on home oxygen  at baseline, CHF, paroxysmal atrial fibrillation on Eliquis  therapy, aortic atherosclerosis, previous skin cancer, diverticulosis, hypertension, GERD, hyperlipidemia, previous pneumonia, and sleep apnea who presents with some fatigue, lightheadedness, cough, mild shortness of breath, tachycardia,  and hypotension.  According to patient, for the last few days she has felt more fatigued and tired and had less energy.  She is also had some dry cough but no hemoptysis reported.  She denies any chest pain but occasionally has palpitations.  She does not feel when she is in A-fib or not.  She had a normal follow-up doctor's appointment today and was found to be in A-fib with heart rate going in the 120s.  After returning home she was feeling more tired and found her blood pressure to be 82 systolic and heart rate was going into the 160s.  She did have some fatigue but had no syncopal episodes.  She received some fluids with EMS and was feeling better.  Otherwise she reports the cough and malaise but has no other complaints.  She did report some mild dysuria and abnormal smell to the urine but thinks it could be related to recent food.  On my exam, blood pressure is over 100 but she is tachycardic in the 140s.  Will order some diltiazem  to help slow her rate down and due to the illness she has been having and malaise and fatigue and A-fib with RVR, anticipate she will likely admission.  Will start diltiazem  drip and get  other workup.  Will check urinalysis given her urinary symptoms.  Will get chest x-ray with her cough and check for COVID/flu/RSV given the ongoing pandemic.  EKG did not show STEMI but showed faster A-fib with RVR.  Anticipate admission after workup is completed.  10:19 PM Heart rate is slowly coming down from the 160s and is now in the 130s.  It appears to show some flutter patterns on and off.  She is negative for COVID/flu/RSV and her x-ray did not show pneumonia.  Her urinalysis is still in process and given her urinary symptoms we will treat if UTI is discovered.  BNP is slightly elevated but TSH is normal.  Otherwise labs reassuring initially.  Patient tells me that she saw her cardiologist today and they told her she needs an echo.  Will call for medical admission so that she can get the echo and anticipate she may need inpatient cardiology evaluation as well to determine best way to rate control her in the setting of her fatigue and intermittent soft pressures.  10:52 PM Urinalysis does show leukocytes and bacteria, given her symptoms I do suspect UTI.  I spoke to pharmacy who recommended Rocephin  so she will get this.  Will call for admission for UTI treatment and management of her A-fib with RVR.      {Document critical care time when appropriate  Document review of labs and clinical decision tools ie CHADS2VASC2, etc  Document your independent review of radiology images and any outside records  Document your discussion with family members, caretakers and with consultants  Document social determinants of health affecting pt's care  Document your decision making why or why not admission, treatments were needed:32947:::1}   Final diagnoses:  Acute cystitis without hematuria  Atrial fibrillation with RVR (HCC)  Fatigue, unspecified type  Shortness of breath     Clinical Impression: 1. Acute cystitis without hematuria   2. Atrial fibrillation with RVR (HCC)   3. Fatigue,  unspecified type   4. Shortness of breath     Disposition: Admit  This note was prepared with assistance of Dragon voice recognition software. Occasional wrong-word or sound-a-like substitutions may have occurred due to the inherent limitations of voice recognition software.

## 2024-04-21 ENCOUNTER — Inpatient Hospital Stay (HOSPITAL_COMMUNITY): Admitting: Anesthesiology

## 2024-04-21 ENCOUNTER — Encounter (HOSPITAL_COMMUNITY): Payer: Self-pay | Admitting: Family Medicine

## 2024-04-21 ENCOUNTER — Encounter (HOSPITAL_COMMUNITY): Admission: EM | Disposition: A | Payer: Self-pay | Source: Home / Self Care | Attending: Internal Medicine

## 2024-04-21 ENCOUNTER — Inpatient Hospital Stay (HOSPITAL_COMMUNITY)

## 2024-04-21 ENCOUNTER — Ambulatory Visit: Payer: Self-pay | Admitting: Physician Assistant

## 2024-04-21 DIAGNOSIS — I110 Hypertensive heart disease with heart failure: Secondary | ICD-10-CM

## 2024-04-21 DIAGNOSIS — I5032 Chronic diastolic (congestive) heart failure: Secondary | ICD-10-CM

## 2024-04-21 DIAGNOSIS — I4891 Unspecified atrial fibrillation: Secondary | ICD-10-CM

## 2024-04-21 DIAGNOSIS — I480 Paroxysmal atrial fibrillation: Secondary | ICD-10-CM | POA: Diagnosis not present

## 2024-04-21 DIAGNOSIS — Z87891 Personal history of nicotine dependence: Secondary | ICD-10-CM | POA: Diagnosis not present

## 2024-04-21 DIAGNOSIS — I700 Atherosclerosis of aorta: Secondary | ICD-10-CM | POA: Diagnosis not present

## 2024-04-21 DIAGNOSIS — N3000 Acute cystitis without hematuria: Principal | ICD-10-CM

## 2024-04-21 DIAGNOSIS — E785 Hyperlipidemia, unspecified: Secondary | ICD-10-CM

## 2024-04-21 HISTORY — PX: CARDIOVERSION: EP1203

## 2024-04-21 LAB — CBC
HCT: 39.1 % (ref 36.0–46.0)
Hematocrit: 42.8 % (ref 34.0–46.6)
Hemoglobin: 13.4 g/dL (ref 11.1–15.9)
Hemoglobin: 12.3 g/dL (ref 12.0–15.0)
MCH: 27.1 pg (ref 26.6–33.0)
MCH: 27.4 pg (ref 26.0–34.0)
MCHC: 31.3 g/dL — ABNORMAL LOW (ref 31.5–35.7)
MCHC: 31.5 g/dL (ref 30.0–36.0)
MCV: 87 fL (ref 79–97)
MCV: 87.1 fL (ref 80.0–100.0)
Platelets: 244 x10E3/uL (ref 150–450)
Platelets: 224 K/uL (ref 150–400)
RBC: 4.95 x10E6/uL (ref 3.77–5.28)
RBC: 4.49 MIL/uL (ref 3.87–5.11)
RDW: 13.6 % (ref 11.7–15.4)
RDW: 14.9 % (ref 11.5–15.5)
WBC: 8.1 x10E3/uL (ref 3.4–10.8)
WBC: 7.6 K/uL (ref 4.0–10.5)
nRBC: 0 % (ref 0.0–0.2)

## 2024-04-21 LAB — COMPREHENSIVE METABOLIC PANEL WITH GFR
ALT: 12 IU/L (ref 0–32)
AST: 12 IU/L (ref 0–40)
Albumin: 4.2 g/dL (ref 3.8–4.8)
Alkaline Phosphatase: 105 IU/L (ref 49–135)
BUN/Creatinine Ratio: 21 (ref 12–28)
BUN: 17 mg/dL (ref 8–27)
Bilirubin Total: 0.4 mg/dL (ref 0.0–1.2)
CO2: 21 mmol/L (ref 20–29)
Calcium: 9.2 mg/dL (ref 8.7–10.3)
Chloride: 101 mmol/L (ref 96–106)
Creatinine, Ser: 0.8 mg/dL (ref 0.57–1.00)
Globulin, Total: 2.4 g/dL (ref 1.5–4.5)
Glucose: 124 mg/dL — AB (ref 70–99)
Potassium: 3.9 mmol/L (ref 3.5–5.2)
Sodium: 142 mmol/L (ref 134–144)
Total Protein: 6.6 g/dL (ref 6.0–8.5)
eGFR: 75 mL/min/1.73 (ref >59)

## 2024-04-21 LAB — MAGNESIUM: Magnesium: 1.8 mg/dL (ref 1.7–2.4)

## 2024-04-21 LAB — BASIC METABOLIC PANEL WITH GFR
Anion gap: 8 (ref 5–15)
BUN: 16 mg/dL (ref 8–23)
CO2: 26 mmol/L (ref 22–32)
Calcium: 8.2 mg/dL — ABNORMAL LOW (ref 8.9–10.3)
Chloride: 110 mmol/L (ref 98–111)
Creatinine, Ser: 0.8 mg/dL (ref 0.44–1.00)
GFR, Estimated: >60 mL/min (ref >60)
Glucose, Bld: 129 mg/dL — ABNORMAL HIGH (ref 70–99)
Potassium: 3.4 mmol/L — ABNORMAL LOW (ref 3.5–5.1)
Sodium: 144 mmol/L (ref 135–145)

## 2024-04-21 LAB — TSH: TSH: 3.65 u[IU]/mL (ref 0.450–4.500)

## 2024-04-21 MED ORDER — SODIUM CHLORIDE 0.9 % IV SOLN: INTRAVENOUS | Status: DC

## 2024-04-21 MED ORDER — POTASSIUM CHLORIDE 10 MEQ/100ML IV SOLN
10.0000 meq | INTRAVENOUS | Status: AC
  Administered 2024-04-21 (×3): 10 meq via INTRAVENOUS
  Filled 2024-04-21 (×3): qty 100

## 2024-04-21 MED ORDER — ACETAMINOPHEN 650 MG RE SUPP: 650.0000 mg | Freq: Four times a day (QID) | RECTAL | Status: DC | PRN

## 2024-04-21 MED ORDER — IPRATROPIUM-ALBUTEROL 0.5-2.5 (3) MG/3ML IN SOLN: 3.0000 mL | RESPIRATORY_TRACT | Status: DC | PRN

## 2024-04-21 MED ORDER — BUDESON-GLYCOPYRROL-FORMOTEROL 160-9-4.8 MCG/ACT IN AERO
2.0000 | INHALATION_SPRAY | Freq: Two times a day (BID) | RESPIRATORY_TRACT | Status: DC
  Administered 2024-04-21 – 2024-04-22 (×3): 2 via RESPIRATORY_TRACT
  Filled 2024-04-21 (×2): qty 5.9

## 2024-04-21 MED ORDER — FUROSEMIDE 40 MG PO TABS
40.0000 mg | ORAL_TABLET | Freq: Every morning | ORAL | Status: DC
  Administered 2024-04-21 – 2024-04-22 (×2): 40 mg via ORAL
  Filled 2024-04-21: qty 2
  Filled 2024-04-21: qty 1

## 2024-04-21 MED ORDER — PROCHLORPERAZINE EDISYLATE 10 MG/2ML IJ SOLN: 5.0000 mg | Freq: Four times a day (QID) | INTRAMUSCULAR | Status: DC | PRN

## 2024-04-21 MED ORDER — ACETAMINOPHEN 325 MG PO TABS
650.0000 mg | ORAL_TABLET | Freq: Four times a day (QID) | ORAL | Status: DC | PRN
  Administered 2024-04-21 – 2024-04-22 (×2): 650 mg via ORAL
  Filled 2024-04-21 (×2): qty 2

## 2024-04-21 MED ORDER — DULOXETINE HCL 60 MG PO CPEP
60.0000 mg | ORAL_CAPSULE | Freq: Every morning | ORAL | Status: DC
  Administered 2024-04-21 – 2024-04-22 (×2): 60 mg via ORAL
  Filled 2024-04-21: qty 1
  Filled 2024-04-21: qty 2

## 2024-04-21 MED ORDER — PROPOFOL 10 MG/ML IV BOLUS
INTRAVENOUS | Status: DC | PRN
  Administered 2024-04-21: 50 mg via INTRAVENOUS

## 2024-04-21 MED ORDER — SODIUM CHLORIDE 0.9% FLUSH
3.0000 mL | Freq: Two times a day (BID) | INTRAVENOUS | Status: DC
  Administered 2024-04-21 – 2024-04-22 (×3): 3 mL via INTRAVENOUS

## 2024-04-21 MED ORDER — APIXABAN 5 MG PO TABS
5.0000 mg | ORAL_TABLET | Freq: Two times a day (BID) | ORAL | Status: DC
  Administered 2024-04-21 – 2024-04-22 (×4): 5 mg via ORAL
  Filled 2024-04-21 (×4): qty 1

## 2024-04-21 MED ORDER — POTASSIUM CHLORIDE CRYS ER 20 MEQ PO TBCR
20.0000 meq | EXTENDED_RELEASE_TABLET | Freq: Once | ORAL | Status: AC
  Administered 2024-04-21: 20 meq via ORAL
  Filled 2024-04-21: qty 1

## 2024-04-21 MED ORDER — MONTELUKAST SODIUM 10 MG PO TABS
10.0000 mg | ORAL_TABLET | Freq: Every day | ORAL | Status: DC
  Administered 2024-04-21 (×2): 10 mg via ORAL
  Filled 2024-04-21 (×2): qty 1

## 2024-04-21 MED ORDER — METOPROLOL SUCCINATE ER 50 MG PO TB24
50.0000 mg | ORAL_TABLET | Freq: Every day | ORAL | Status: DC
  Administered 2024-04-21 – 2024-04-22 (×2): 50 mg via ORAL
  Filled 2024-04-21: qty 1
  Filled 2024-04-21: qty 2

## 2024-04-21 MED ORDER — SODIUM CHLORIDE 0.9 % IV SOLN
1.0000 g | Freq: Every day | INTRAVENOUS | Status: DC
  Administered 2024-04-21 (×2): 1 g via INTRAVENOUS
  Filled 2024-04-21 (×2): qty 10

## 2024-04-21 MED ORDER — LIDOCAINE 2% (20 MG/ML) 5 ML SYRINGE
INTRAMUSCULAR | Status: DC | PRN
  Administered 2024-04-21: 100 mg via INTRAVENOUS

## 2024-04-21 MED ORDER — PRAVASTATIN SODIUM 40 MG PO TABS
40.0000 mg | ORAL_TABLET | Freq: Every day | ORAL | Status: DC
  Administered 2024-04-21 (×2): 40 mg via ORAL
  Filled 2024-04-21 (×2): qty 1

## 2024-04-21 MED ORDER — SENNOSIDES-DOCUSATE SODIUM 8.6-50 MG PO TABS: 1.0000 | ORAL_TABLET | Freq: Every evening | ORAL | Status: DC | PRN

## 2024-04-21 MED ORDER — SODIUM CHLORIDE 0.9% FLUSH
INTRAVENOUS | Status: DC | PRN
  Administered 2024-04-21: 3 mL via INTRAVENOUS

## 2024-04-21 MED ORDER — FLECAINIDE ACETATE 50 MG PO TABS
50.0000 mg | ORAL_TABLET | Freq: Two times a day (BID) | ORAL | Status: DC
  Administered 2024-04-21 – 2024-04-22 (×4): 50 mg via ORAL
  Filled 2024-04-21 (×5): qty 1

## 2024-04-21 NOTE — Progress Notes (Signed)
 Echo attempted, patient in cath lab for cardioversion. Will do in am 12/4. [facility] Zalan Shidler RDCS

## 2024-04-21 NOTE — Progress Notes (Signed)
 Progress Note    [name] Molina   FMW:[id number]  DOB: [birth year]  DOA: 04/20/2024     1 PCP: Teresa Channel, MD  Initial CC: Fatigue, rapid heart rate  Hospital Course: [name] Molina is a 78 yo female with PMH COPD, OSA with CPAP intolerance, chronic hypoxic respiratory failure, chronic HFpEF, severe mitral annular calcification followed by cardiology, and PAF on Eliquis  who presented with rapid heart rate and fatigue.   She developed RVR in an outpatient clinic prior to admission although was asymptomatic with no palpitations however did have fatigue. Workup in the ER she was found to be in A-fib with RVR with rate in the 130s.  She was started on a Cardizem  drip with minimal response.  Cardiology was also consulted on admission.  Interval History:  Seen this morning in the ER.  Still in A-fib with RVR on the monitor.  Cardizem  drip at max rate.  Some fatigue but denying any chest pain or palpitations nor SOB.  Assessment and Plan: * Atrial fibrillation with RVR (HCC) - Anticoagulated at home on Eliquis  with no missed doses per patient.  Good compliance -Chronically treated with Cardizem , flecainide , Toprol .  Usually has good rate control on home regimen with no prior need for cardioversion in the past -Started on Cardizem  drip on admission with difficulty achieving rate control -Evaluated by cardiology with recommendations for consideration of cardioversion  Acute cystitis without hematuria - seems to be asymptomatic but states she's felt from suprapubic discomfort recently - UA noted with moderate LE, negative nitrite, greater than 50 WBC, rare bacteria - Potential trigger of A-fib with RVR, therefore will treat for now - No urine culture sent, will treat empirically and finish course  Chronic diastolic CHF (congestive heart failure) (HCC) - appears euvolemic; no s/s exac - new echo unable to be performed with RVR - last echo 07/05/22: EF 55 to 60%, no RWMA, mild LVH, grade 2 DD -  Continue Lasix   Chronic hypoxic respiratory failure (HCC) - Continue O2, on 3 L currently  Hyperlipidemia - Continue statin  COPD (chronic obstructive pulmonary disease) (HCC) - no s/s exac at this time   Antimicrobials: Rocephin  04/21/2024 >> current  DVT prophylaxis:   apixaban  (ELIQUIS ) tablet 5 mg   Code Status:   Code Status: Full Code  Mobility Assessment (Last 72 Hours)     Mobility Assessment   No documentation.     Diet: Diet Orders (From admission, onward)     Start     Ordered   04/21/24 1226  Diet Heart Room service appropriate? Yes; Fluid consistency: Thin  Diet effective now       Question Answer Comment  Room service appropriate? Yes   Fluid consistency: Thin      04/21/24 1225            Barriers to discharge: None Disposition Plan: Home HH orders placed: N/A Status is: Inpatient  Objective: Blood pressure 110/63, pulse 85, temperature 98.1 F (36.7 C), temperature source Oral, resp. rate 20, height 5' 2.5 (1.588 m), weight 103 kg, SpO2 98%.  Examination:  Physical Exam Constitutional:      Appearance: Normal appearance.  HENT:     Head: Normocephalic and atraumatic.     Mouth/Throat:     Mouth: Mucous membranes are moist.  Eyes:     Extraocular Movements: Extraocular movements intact.  Cardiovascular:     Rate and Rhythm: Tachycardia present. Rhythm irregular.  Pulmonary:     Effort: Pulmonary effort is  normal. No respiratory distress.     Breath sounds: Normal breath sounds. No wheezing.  Abdominal:     General: Bowel sounds are normal. There is no distension.     Palpations: Abdomen is soft.     Tenderness: There is no abdominal tenderness.  Musculoskeletal:        General: Normal range of motion.     Cervical back: Normal range of motion and neck supple.     Right lower leg: Edema present.     Left lower leg: Edema present.  Skin:    General: Skin is warm and dry.     Comments: Darkened skin appreciated on legs   Neurological:     General: No focal deficit present.     Mental Status: She is alert.  Psychiatric:        Mood and Affect: Mood normal.      Consultants:  Cardiology  Procedures:    Data Reviewed: Results for orders placed or performed during the hospital encounter of 04/20/24 (from the past 24 hours)  Basic metabolic panel     Status: Abnormal   Collection Time: 04/20/24  8:18 PM  Result Value Ref Range   Sodium 141 135 - 145 mmol/L   Potassium 3.9 3.5 - 5.1 mmol/L   Chloride 109 98 - 111 mmol/L   CO2 25 22 - 32 mmol/L   Glucose, Bld 128 (H) 70 - 99 mg/dL   BUN 22 8 - 23 mg/dL   Creatinine, Ser 8.82 (H) 0.44 - 1.00 mg/dL   Calcium 8.2 (L) 8.9 - 10.3 mg/dL   GFR, Estimated 47 (L) >60 mL/min   Anion gap 7 5 - 15  CBC     Status: None   Collection Time: 04/20/24  8:18 PM  Result Value Ref Range   WBC 8.6 4.0 - 10.5 K/uL   RBC 4.91 3.87 - 5.11 MIL/uL   Hemoglobin 13.3 12.0 - 15.0 g/dL   HCT 57.3 63.9 - 53.9 %   MCV 86.8 80.0 - 100.0 fL   MCH 27.1 26.0 - 34.0 pg   MCHC 31.2 30.0 - 36.0 g/dL   RDW 85.1 88.4 - 84.4 %   Platelets 258 150 - 400 K/uL   nRBC 0.0 0.0 - 0.2 %  Troponin I (High Sensitivity)     Status: Abnormal   Collection Time: 04/20/24  8:18 PM  Result Value Ref Range   Troponin I (High Sensitivity) 18 (H) <18 ng/L  Hepatic function panel     Status: Abnormal   Collection Time: 04/20/24  8:18 PM  Result Value Ref Range   Total Protein 6.3 (L) 6.5 - 8.1 g/dL   Albumin 3.4 (L) 3.5 - 5.0 g/dL   AST 17 15 - 41 U/L   ALT 12 0 - 44 U/L   Alkaline Phosphatase 81 38 - 126 U/L   Total Bilirubin 0.5 0.0 - 1.2 mg/dL   Bilirubin, Direct <9.8 0.0 - 0.2 mg/dL   Indirect Bilirubin NOT CALCULATED 0.3 - 0.9 mg/dL  Magnesium      Status: None   Collection Time: 04/20/24  8:18 PM  Result Value Ref Range   Magnesium  2.0 1.7 - 2.4 mg/dL  TSH     Status: None   Collection Time: 04/20/24  8:18 PM  Result Value Ref Range   TSH 3.984 0.350 - 4.500 uIU/mL  Brain  natriuretic peptide     Status: Abnormal   Collection Time: 04/20/24  8:18 PM  Result Value Ref  Range   B Natriuretic Peptide 112.6 (H) 0.0 - 100.0 pg/mL  Resp panel by RT-PCR (RSV, Flu A&B, Covid) Anterior Nasal Swab     Status: None   Collection Time: 04/20/24  9:04 PM   Specimen: Anterior Nasal Swab  Result Value Ref Range   SARS Coronavirus 2 by RT PCR NEGATIVE NEGATIVE   Influenza A by PCR NEGATIVE NEGATIVE   Influenza B by PCR NEGATIVE NEGATIVE   Resp Syncytial Virus by PCR NEGATIVE NEGATIVE  Urinalysis, w/ Reflex to Culture (Infection Suspected) -Urine, Clean Catch     Status: Abnormal   Collection Time: 04/20/24 10:15 PM  Result Value Ref Range   Specimen Source URINE, CLEAN CATCH    Color, Urine AMBER (A) YELLOW   APPearance CLOUDY (A) CLEAR   Specific Gravity, Urine 1.036 (H) 1.005 - 1.030   pH 5.0 5.0 - 8.0   Glucose, UA NEGATIVE NEGATIVE mg/dL   Hgb urine dipstick NEGATIVE NEGATIVE   Bilirubin Urine NEGATIVE NEGATIVE   Ketones, ur NEGATIVE NEGATIVE mg/dL   Protein, ur 30 (A) NEGATIVE mg/dL   Nitrite NEGATIVE NEGATIVE   Leukocytes,Ua MODERATE (A) NEGATIVE   RBC / HPF 6-10 0 - 5 RBC/hpf   WBC, UA >50 0 - 5 WBC/hpf   Bacteria, UA RARE (A) NONE SEEN   Squamous Epithelial / HPF 11-20 0 - 5 /HPF   Hyaline Casts, UA PRESENT   Troponin I (High Sensitivity)     Status: Abnormal   Collection Time: 04/20/24 10:15 PM  Result Value Ref Range   Troponin I (High Sensitivity) 18 (H) <18 ng/L  Basic metabolic panel     Status: Abnormal   Collection Time: 04/21/24  4:56 AM  Result Value Ref Range   Sodium 144 135 - 145 mmol/L   Potassium 3.4 (L) 3.5 - 5.1 mmol/L   Chloride 110 98 - 111 mmol/L   CO2 26 22 - 32 mmol/L   Glucose, Bld 129 (H) 70 - 99 mg/dL   BUN 16 8 - 23 mg/dL   Creatinine, Ser 9.19 0.44 - 1.00 mg/dL   Calcium 8.2 (L) 8.9 - 10.3 mg/dL   GFR, Estimated >39 >39 mL/min   Anion gap 8 5 - 15  Magnesium      Status: None   Collection Time: 04/21/24  4:56 AM  Result  Value Ref Range   Magnesium  1.8 1.7 - 2.4 mg/dL  CBC     Status: None   Collection Time: 04/21/24  4:56 AM  Result Value Ref Range   WBC 7.6 4.0 - 10.5 K/uL   RBC 4.49 3.87 - 5.11 MIL/uL   Hemoglobin 12.3 12.0 - 15.0 g/dL   HCT 60.8 63.9 - 53.9 %   MCV 87.1 80.0 - 100.0 fL   MCH 27.4 26.0 - 34.0 pg   MCHC 31.5 30.0 - 36.0 g/dL   RDW 85.0 88.4 - 84.4 %   Platelets 224 150 - 400 K/uL   nRBC 0.0 0.0 - 0.2 %    I have reviewed pertinent nursing notes, vitals, labs, and images as necessary. I have ordered labwork to follow up on as indicated.  I have reviewed the last notes from staff over past 24 hours. I have discussed patient's care plan and test results with nursing staff, CM/SW, and other staff as appropriate.  Old records reviewed in assessment of this patient  Time spent: Greater than 50% of the 55 minute visit was spent in counseling/coordination of care for the patient as laid  out in the A&P.   LOS: 1 day   Alm Apo, MD [facility] 04/21/2024, 2:31 PM

## 2024-04-21 NOTE — H&P (Signed)
 History and Physical    [name] Molina FMW:[id number] DOB: 15-Jun-1945 DOA: 04/20/2024  PCP: Teresa Channel, MD   Patient coming from: Home   Chief Complaint: Rapid HR, fatigue   HPI: [name] Molina is a 78 y.o. female with medical history significant for COPD, OSA with CPAP intolerance, chronic hypoxic respiratory failure, chronic HFpEF, severe mitral annular calcification followed by cardiology, and PAF on Eliquis  who presents with rapid heart rate and fatigue.  Patient has noted her heart rate to be rapid on her pulse oximeter at home.  She also had rapid heart rate in an outpatient clinic today.  She does not feel palpitations but notes that she becomes very fatigued and feels generally poor when her heart rate is going fast.  She has been extremely fatigued since this afternoon.  She denies any chest pain, shortness of breath, or productive cough.  She reports strict adherence with Eliquis .  Patient also notes recent dysuria but no flank pain or fevers.  ED Course: Upon arrival to the ED, patient is found to be afebrile and saturating well on 3 L/min of supplemental oxygen  with elevated HR and SBP in the 90s.  EKG demonstrates atrial fibrillation with rate 137.  Chest x-ray notable for mild cardiomegaly.  Labs are most notable for creatinine 1.17, normal CBC, troponin 18, and BNP 113.  UA notable for bacteriuria and pyuria.  Patient was started on diltiazem  infusion and cardiology was consulted by the ED physician.  Review of Systems:  All other systems reviewed and apart from HPI, are negative.  Past Medical History:  Diagnosis Date   Anxiety    Aortic atherosclerosis    Arthritis    Asthma    Atrial tachycardia    Cancer (HCC)    basal skin cancer on nose   Cataract    Chronic diastolic CHF (congestive heart failure) (HCC)    Complication of anesthesia    during cervical surgery she woke up during the procedure   COPD (chronic obstructive pulmonary disease) (HCC)    Depression     Diverticulosis    Family history of adverse reaction to anesthesia    mother had post op N&V   GERD (gastroesophageal reflux disease)    History of hiatal hernia    Hyperlipidemia    Hypertension    Insomnia    Macular degeneration    Migraines    Neuropathy    Obesity    PAF (paroxysmal atrial fibrillation) (HCC)    Pneumonia    Sleep apnea    history of it,not using a cpap, study 2018: didn't need cpap   Thyroid  nodule 2000   radioactive caspule    Past Surgical History:  Procedure Laterality Date   ANTERIOR CERVICAL DECOMP/DISCECTOMY FUSION N/A 06/15/2020   Procedure: ANTERIOR CERVICAL DECOMPRESSION FUSION CERVICAL FIVE-CERVICAL SIX WITH INSTRUMENTATION AND ALLOGRAFT;  Surgeon: Beuford Anes, MD;  Location: [facility] OR;  Service: Orthopedics;  Laterality: N/A;  anterior   CERVICAL POLYPECTOMY     INSIDE AND OUTSIDE   COLONOSCOPY     EYE SURGERY Bilateral    cataracts   POLYPECTOMY     REMOVED CARTLIAGE RIB      Social History:   reports that she quit smoking about 32 years ago. Her smoking use included cigarettes. She started smoking about 52 years ago. She has a 20 pack-year smoking history. She has never used smokeless tobacco. She reports that she does not drink alcohol and does not use drugs.  Allergies  Allergen Reactions   Food Shortness Of Breath    ALLERGY= MUSHROOMS   Levaquin  [Levofloxacin  In D5w] Other (See Comments)    made me want to die.   Tequin Shortness Of Breath   Erythromycin Other (See Comments)    GI UPSET   Oxycodone -Acetaminophen  Itching   Codeine Nausea And Vomiting   Penicillins Rash and Other (See Comments)    Reaction occurred during childhood   Prednisone  Rash   Wellbutrin [Bupropion Hcl] Other (See Comments)    SHAKING    Family History  Problem Relation Age of Onset   Colon polyps Sister 43       PRECANCEROUS POLYPS   Breast cancer Maternal Grandmother 94   Emphysema Father    Asthma Father    Heart disease Father    Heart  disease Paternal Grandfather    Colon cancer Neg Hx      Prior to Admission medications   Medication Sig Start Date End Date Taking? Authorizing Provider  acetaminophen  (TYLENOL ) 650 MG CR tablet Take 1,300 mg by mouth daily as needed for pain.    [provider]  albuterol  (VENTOLIN  HFA) 108 (90 Base) MCG/ACT inhaler Inhale 2 puffs into the lungs every 6 (six) hours as needed for wheezing or shortness of breath. 03/03/24   Shelah Lamar RAMAN, MD  BESIVANCE 0.6 % SUSP Place 1 drop into the left eye See admin instructions. Instill 1 drop into the left eye 4 times daily the day OF and day AFTER (eye injection) 09/07/19   [provider]  Budeson-Glycopyrrol-Formoterol  (BREZTRI  AEROSPHERE) 160-9-4.8 MCG/ACT AERO Inhale 2 puffs into the lungs in the morning and at bedtime. 06/02/23   Shelah Lamar RAMAN, MD  Cholecalciferol 50 MCG (2000 UT) CAPS Take 2,000 Units by mouth daily.    [provider]  cyanocobalamin  (VITAMIN B12) 1000 MCG tablet Take 1,000 mcg by mouth daily.    [provider]  diltiazem  (CARDIZEM  CD) 240 MG 24 hr capsule TAKE 1 CAPSULE BY MOUTH EVERY MORNING 06/09/23   Jeffrie Oneil BROCKS, MD  DULoxetine  (CYMBALTA ) 60 MG capsule Take 60 mg by mouth every morning. 03/23/20   [provider]  ELIQUIS  5 MG TABS tablet TAKE 1 TABLET (5 MG TOTAL) BY MOUTH 2 (TWO) TIMES DAILY. 02/03/24   Jeffrie Oneil BROCKS, MD  flecainide  (TAMBOCOR ) 50 MG tablet Take 1 tablet (50 mg total) by mouth 2 (two) times daily. 04/02/24   Jeffrie Oneil BROCKS, MD  fluticasone  (FLONASE ) 50 MCG/ACT nasal spray Place 1 spray into both nostrils daily. 07/19/16   Shelah Lamar RAMAN, MD  furosemide  (LASIX ) 40 MG tablet TAKE 1 TABLET BY MOUTH EVERY MORNING 06/09/23   Jeffrie Oneil BROCKS, MD  ipratropium-albuterol  (DUONEB) 0.5-2.5 (3) MG/3ML SOLN Take 3 mLs by nebulization every 4 (four) hours as needed (shortness of breath). 07/22/23   Shelah Lamar RAMAN, MD  LORazepam  (ATIVAN ) 0.5 MG tablet Take 1-2 tablets (0.5-1 mg  total) by mouth daily as needed for anxiety. Anxiety 03/15/21   Amin, Ankit C, MD  metoprolol  succinate (TOPROL -XL) 50 MG 24 hr tablet TAKE 25 MG (1/2 TABLET) IN THE MORNING AND 50 MG (1 WHOLE TABLET) AT NIGHT. 04/20/24   Parthenia Olivia HERO, PA-C  montelukast  (SINGULAIR ) 10 MG tablet Take 1 tablet (10 mg total) by mouth at bedtime. 11/03/23   Shelah Lamar RAMAN, MD  Multiple Vitamins-Minerals (PRESERVISION AREDS 2+MULTI VIT) CAPS Take 1 tablet by mouth 2 (two) times daily.    [provider]  omeprazole (PRILOSEC)  40 MG capsule Take 40 mg by mouth every morning.    [provider]  OXYGEN  Inhale 3 L into the lungs continuous.    [provider]  pravastatin  (PRAVACHOL ) 40 MG tablet Take 40 mg by mouth at bedtime.  09/04/10   [provider]    Physical Exam: Vitals:   04/20/24 2245 04/21/24 0000 04/21/24 0015 04/21/24 0045  BP: 119/83 112/79 114/84 112/81  Pulse: (!) 138 (!) 131 (!) 129 (!) 144  Resp: 19 15 20 19   Temp:    97.6 F (36.4 C)  TempSrc:    Oral  SpO2: 98% 98% 98% 98%  Weight:      Height:        Constitutional: NAD, calm  Eyes: PERTLA, lids and conjunctivae normal ENMT: Mucous membranes are moist. Posterior pharynx clear of any exudate or lesions.   Neck: supple, no masses  Respiratory: no wheezing, no crackles. No accessory muscle use.  Cardiovascular: Rate ~120 and irregularly irregular. No extremity edema.   Abdomen: No distension, no tenderness, soft. Bowel sounds active.  Musculoskeletal: no clubbing / cyanosis. No joint deformity upper and lower extremities.   Skin: no significant rashes, lesions, ulcers. Warm, dry, well-perfused. Neurologic: CN 2-12 grossly intact. Moving all extremities. Alert and oriented.  Psychiatric: Pleasant. Cooperative.    Labs and Imaging on Admission: I have personally reviewed following labs and imaging studies  CBC: Recent Labs  Lab 04/20/24 1132 04/20/24 2018  WBC 8.1 8.6  HGB 13.4 13.3  HCT  42.8 42.6  MCV 87 86.8  PLT 244 258   Basic Metabolic Panel: Recent Labs  Lab 04/20/24 1132 04/20/24 2018  NA WILL FOLLOW 141  K WILL FOLLOW 3.9  CL WILL FOLLOW 109  CO2 WILL FOLLOW 25  GLUCOSE WILL FOLLOW 128*  BUN WILL FOLLOW 22  CREATININE WILL FOLLOW 1.17*  CALCIUM WILL FOLLOW 8.2*  MG  --  2.0   GFR: Estimated Creatinine Clearance: 44.3 mL/min (A) (by C-G formula based on SCr of 1.17 mg/dL (H)). Liver Function Tests: Recent Labs  Lab 04/20/24 1132 04/20/24 2018  AST WILL FOLLOW 17  ALT WILL FOLLOW 12  ALKPHOS WILL FOLLOW 81  BILITOT WILL FOLLOW 0.5  PROT WILL FOLLOW 6.3*  ALBUMIN WILL FOLLOW 3.4*   No results for input(s): LIPASE, AMYLASE in the last 168 hours. No results for input(s): AMMONIA in the last 168 hours. Coagulation Profile: No results for input(s): INR, PROTIME in the last 168 hours. Cardiac Enzymes: No results for input(s): CKTOTAL, CKMB, CKMBINDEX, TROPONINI in the last 168 hours. BNP (last 3 results) No results for input(s): PROBNP in the last 8760 hours. HbA1C: No results for input(s): HGBA1C in the last 72 hours. CBG: No results for input(s): GLUCAP in the last 168 hours. Lipid Profile: No results for input(s): CHOL, HDL, LDLCALC, TRIG, CHOLHDL, LDLDIRECT in the last 72 hours. Thyroid  Function Tests: Recent Labs    04/20/24 2018  TSH 3.984   Anemia Panel: No results for input(s): VITAMINB12, FOLATE, FERRITIN, TIBC, IRON, RETICCTPCT in the last 72 hours. Urine analysis:    Component Value Date/Time   COLORURINE AMBER (A) 04/20/2024 2215   APPEARANCEUR CLOUDY (A) 04/20/2024 2215   LABSPEC 1.036 (H) 04/20/2024 2215   PHURINE 5.0 04/20/2024 2215   GLUCOSEU NEGATIVE 04/20/2024 2215   HGBUR NEGATIVE 04/20/2024 2215   BILIRUBINUR NEGATIVE 04/20/2024 2215   KETONESUR NEGATIVE 04/20/2024 2215   PROTEINUR 30 (A) 04/20/2024 2215   NITRITE NEGATIVE 04/20/2024 2215  LEUKOCYTESUR MODERATE (A)  04/20/2024 2215   Sepsis Labs: @LABRCNTIP (procalcitonin:4,lacticidven:4) ) Recent Results (from the past 240 hours)  Resp panel by RT-PCR (RSV, Flu A&B, Covid) Anterior Nasal Swab     Status: None   Collection Time: 04/20/24  9:04 PM   Specimen: Anterior Nasal Swab  Result Value Ref Range Status   SARS Coronavirus 2 by RT PCR NEGATIVE NEGATIVE Final   Influenza A by PCR NEGATIVE NEGATIVE Final   Influenza B by PCR NEGATIVE NEGATIVE Final    Comment: (NOTE) The Xpert Xpress SARS-CoV-2/FLU/RSV plus assay is intended as an aid in the diagnosis of influenza from Nasopharyngeal swab specimens and should not be used as a sole basis for treatment. Nasal washings and aspirates are unacceptable for Xpert Xpress SARS-CoV-2/FLU/RSV testing.  Fact Sheet for Patients: bloggercourse.com  Fact Sheet for Healthcare Providers: seriousbroker.it  This test is not yet approved or cleared by the [place]  FDA and has been authorized for detection and/or diagnosis of SARS-CoV-2 by FDA under an Emergency Use Authorization (EUA). This EUA will remain in effect (meaning this test can be used) for the duration of the COVID-19 declaration under Section 564(b)(1) of the Act, 21 U.S.C. section 360bbb-3(b)(1), unless the authorization is terminated or revoked.     Resp Syncytial Virus by PCR NEGATIVE NEGATIVE Final    Comment: (NOTE) Fact Sheet for Patients: bloggercourse.com  Fact Sheet for Healthcare Providers: seriousbroker.it  This test is not yet approved or cleared by the [place]  FDA and has been authorized for detection and/or diagnosis of SARS-CoV-2 by FDA under an Emergency Use Authorization (EUA). This EUA will remain in effect (meaning this test can be used) for the duration of the COVID-19 declaration under Section 564(b)(1) of the Act, 21 U.S.C. section 360bbb-3(b)(1), unless the  authorization is terminated or revoked.  Performed at [facility], [address], [place], [place] [address]      Radiological Exams on Admission: DG Chest Portable 1 View Result Date: 04/20/2024 CLINICAL DATA:  SOB, Afib rvr, fatigue EXAM: PORTABLE CHEST - 1 VIEW COMPARISON:  07/29/2023 FINDINGS: Low lung volumes with streaky bibasilar atelectasis. No focal airspace consolidation, pleural effusion, or pneumothorax. Mild cardiomegaly. Tortuous aorta with aortic atherosclerosis. No acute fracture or destructive lesions. Multilevel thoracic osteophytosis. IMPRESSION: Mild cardiomegaly. No pneumonia or pulmonary edema. Electronically Signed   By: Rogelia Myers M.D.   On: 04/20/2024 21:32    EKG: Independently reviewed. Atrial fibrillation, rate 137.   Assessment/Plan  1. Atrial fibrillation with RVR  - Appreciate cardiology consultation  - Continue Eliquis , continue diltiazem  infusion, continue Toprol  and flecainide , optimize electrolytes, and keep NPO after midnight for possible cardioversion  2. COPD; chronic hypoxic respiratory failure  - Not in exacerbation  - Continue Breztri , supplemental O2, and as-needed DuoNebs  3. Chronic HFpEF; severe mitral annular calcification  - EF was 55-60% in February 2024  - Appears compensated  - Continue Lasix , update echocardiogram per cardiology recommendations   4. UTI  - Not septic on admission  - Treat with Rocephin     DVT prophylaxis: Eliquis   Code Status: Full  Level of Care: Level of care: Progressive Family Communication: None present  Disposition Plan:  Patient is from: Home  Anticipated d/c is to: Home  Anticipated d/c date is: 04/22/24  Patient currently: Pending treatment of rapid atrial fibrillation  Consults called: Cardiology  Admission status: Inpatient     Evalene GORMAN Sprinkles, MD [facility]  04/21/2024, 1:52 AM

## 2024-04-21 NOTE — Assessment & Plan Note (Signed)
-   no s/s exac at this time

## 2024-04-21 NOTE — Assessment & Plan Note (Addendum)
-   appears euvolemic; no s/s exac - new echo unable to be performed with RVR - last echo 07/05/22: EF 55 to 60%, no RWMA, mild LVH, grade 2 DD - Continue Lasix

## 2024-04-21 NOTE — ED Notes (Signed)
 Pharmacy messaged regarding missing breztri 

## 2024-04-21 NOTE — Assessment & Plan Note (Signed)
-   Anticoagulated at home on Eliquis  with no missed doses per patient.  Good compliance -Chronically treated with Cardizem , flecainide , Toprol .  Usually has good rate control on home regimen with no prior need for cardioversion in the past -Started on Cardizem  drip on admission with difficulty achieving rate control -Evaluated by cardiology with recommendations for consideration of cardioversion - Cardioverted successfully on 04/21/2024 back to sinus rhythm -Continued on home meds at discharge

## 2024-04-21 NOTE — Anesthesia Preprocedure Evaluation (Addendum)
 Anesthesia Evaluation  Patient identified by MRN, date of birth, ID band Patient awake    Reviewed: Allergy & Precautions, H&P , NPO status , Patient's Chart, lab work & pertinent test results  Airway Mallampati: III  TM Distance: >3 FB Neck ROM: Full    Dental no notable dental hx.    Pulmonary asthma , sleep apnea , COPD, former smoker   Pulmonary exam normal breath sounds clear to auscultation       Cardiovascular hypertension, +CHF  + dysrhythmias Atrial Fibrillation  Rhythm:Irregular Rate:Tachycardia  Afib with RVR on dilt gtt  IMPRESSIONS     1. Left ventricular ejection fraction, by estimation, is 55 to 60%. The  left ventricle has normal function. The left ventricle has no regional  wall motion abnormalities. There is mild concentric left ventricular  hypertrophy. Left ventricular diastolic  parameters are consistent with Grade II diastolic dysfunction  (pseudonormalization).   2. Right ventricular systolic function is normal. The right ventricular  size is normal. Tricuspid regurgitation signal is inadequate for assessing  PA pressure.   3. Left atrial size was severely dilated.   4. The mitral valve is degenerative. Trivial mitral valve regurgitation.  Mild mitral stenosis. The mean mitral valve gradient is 3.0 mmHg. Moderate  to severe mitral annular calcification.   5. The aortic valve is tricuspid. There is mild calcification of the  aortic valve. Aortic valve regurgitation is not visualized. Aortic valve  sclerosis is present, with no evidence of aortic valve stenosis.   6. The inferior vena cava is normal in size with greater than 50%  respiratory variability, suggesting right atrial pressure of 3 mmHg.   7. Technically difficult study with poor acoustic windows.     Neuro/Psych  Headaches, neg Seizures PSYCHIATRIC DISORDERS Anxiety Depression       GI/Hepatic Neg liver ROS, hiatal hernia,GERD  ,,   Endo/Other  negative endocrine ROS    Renal/GU negative Renal ROS  negative genitourinary   Musculoskeletal  (+) Arthritis ,    Abdominal   Peds negative pediatric ROS (+)  Hematology negative hematology ROS (+)   Anesthesia Other Findings   Reproductive/Obstetrics negative OB ROS                              Anesthesia Physical Anesthesia Plan  ASA: 3  Anesthesia Plan: General   Post-op Pain Management:    Induction: Intravenous  PONV Risk Score and Plan: 3 and Treatment may vary due to age or medical condition  Airway Management Planned: Natural Airway and Simple Face Mask  Additional Equipment: None  Intra-op Plan:   Post-operative Plan:   Informed Consent: I have reviewed the patients History and Physical, chart, labs and discussed the procedure including the risks, benefits and alternatives for the proposed anesthesia with the patient or authorized representative who has indicated his/her understanding and acceptance.     Dental advisory given  Plan Discussed with: CRNA  Anesthesia Plan Comments:          Anesthesia Quick Evaluation

## 2024-04-21 NOTE — CV Procedure (Signed)
   Electrical Cardioversion Procedure Note ODA PLACKE [id number] 1945-07-29  Procedure: Electrical Cardioversion Indications:  Atrial Fibrillation  Time Out: Verified patient identification, verified procedure,medications/allergies/relevent history reviewed, required imaging and test results available. Time out performed  Procedure Details  The patient signed informed consent.   The patient was NPO past midnight. Has had therapeutic anticoagulation with Eliquis  greater than 3 weeks. The patient denies any interruption of anticoagulation.  Anesthesia was administered by Dr. Erma.  Adequate airway was maintained throughout and vital followed per protocol.  He was cardioverted x 1 with 200 J of biphasic synchronized energy.  He converted to NSR.  There were no apparent complications.  The patient tolerated the procedure well and had normal neuro status and respiratory status post procedure with vitals stable as recorded elsewhere.     IMPRESSION:  Successful cardioversion of atrial fibrillation   Follow up:  We will arrange follow up with Primary cardiologist.  He will continue on current medical therapy.  The patient advised to continue anticoagulation.  Merly Hinkson 04/21/2024, 2:53 PM

## 2024-04-21 NOTE — Progress Notes (Signed)
Heart rate too high for accurate echo at this time. 

## 2024-04-21 NOTE — Progress Notes (Signed)
  Progress Note  Patient Name: [name] Molina Date of Encounter: 04/21/2024 [facility] Cardiologist: Oneil Parchment, MD   Interval Summary   Resting comfortably in bed Feeling good, no acute complaints Remains on baseline oxygen  Currently in A-Fib with HR 130-140s, asymptomatic Currently NPO pending possible DCCV  Vital Signs Vitals:   04/21/24 0600 04/21/24 0615 04/21/24 0745 04/21/24 0803  BP: 108/79 112/69 104/65 110/78  Pulse: (!) 124 (!) 128 (!) 125 (!) 135  Resp: 14 20 18 16   Temp:   98.4 F (36.9 C)   TempSrc:   Oral   SpO2: 98% 98% 99% 97%  Weight:      Height:        Intake/Output Summary (Last 24 hours) at 04/21/2024 0832 Last data filed at 04/21/2024 0816 Gross per 24 hour  Intake 249.79 ml  Output --  Net 249.79 ml      04/20/2024    7:57 PM 04/20/2024   10:30 AM 03/03/2024   11:23 AM  Last 3 Weights  Weight (lbs) 227 lb 1.2 oz -- 227 lb  Weight (kg) 103 kg -- 102.967 kg     Telemetry/ECG  A-fib RVR with HR 130-140s - Personally Reviewed  Physical Exam  GEN: No acute distress, remains on baseline oxygen .   Neck: No JVD Cardiac: iRRR, tachycardic.  Respiratory: decreased air movement bilaterally. GI: Soft, nontender, non-distended  MS: chronic LE edema, non-pitting  Assessment & Plan   Paroxysmal A-Fib with RVR Home meds: Eliquis  5 mg BID, diltiazem  240 mg daily, Toprol  25 mg in AM, 50 mg in PM, flecainide  50 mg BID Currently on IV diltiazem  in setting of PO, at 15 mg/hr Continue Toprol  25 mg in AM, 50 mg in PM Continue flecainide  50 mg BID Continue Eliquis  5 mg BID Presently NPO for possible DCCV today, will discuss with MD  Hyperlipidemia Continue pravastatin  40 mg at bedtime   Chronic HFpEF Euvolemic on exam Home meds: Lasix  40 mg daily Continue PO Lasix  40 mg daily   Per primary COPD Urinary tract infection   For questions or updates, please contact [facility] Please consult [email or web address] for contact info under    Signed, Waddell DELENA Donath, PA-C

## 2024-04-21 NOTE — Transfer of Care (Signed)
 Immediate Anesthesia Transfer of Care Note  Patient: [name] Molina  Procedure(s) Performed: CARDIOVERSION  Patient Location: Cath Lab  Anesthesia Type:General  Level of Consciousness: awake, alert , and oriented  Airway & Oxygen  Therapy: Patient Spontanous Breathing and Patient connected to nasal cannula oxygen   Post-op Assessment: Report given to RN, Post -op Vital signs reviewed and stable, and Patient moving all extremities X 4  Post vital signs: Reviewed and stable  Last Vitals:  Vitals Value Taken Time  BP    Temp    Pulse    Resp    SpO2      Last Pain:  Vitals:   04/21/24 1336  TempSrc:   PainSc: 0-No pain         Complications: No notable events documented.

## 2024-04-21 NOTE — Hospital Course (Signed)
 [name] Molina is a 78 yo female with PMH COPD, OSA with CPAP intolerance, chronic hypoxic respiratory failure, chronic HFpEF, severe mitral annular calcification followed by cardiology, and PAF on Eliquis  who presented with rapid heart rate and fatigue.   She developed RVR in an outpatient clinic prior to admission although was asymptomatic with no palpitations however did have fatigue. Workup in the ER she was found to be in A-fib with RVR with rate in the 130s.  She was started on a Cardizem  drip with minimal response.  Cardiology was also consulted on admission.

## 2024-04-21 NOTE — Assessment & Plan Note (Signed)
-   Continue O2, on 3 L currently

## 2024-04-21 NOTE — Plan of Care (Signed)
  Problem: Education: Goal: Knowledge of disease or condition will improve Outcome: Progressing   Problem: Education: Goal: Understanding of medication regimen will improve Outcome: Progressing   Problem: Activity: Goal: Ability to tolerate increased activity will improve Outcome: Progressing   Problem: Education: Goal: Knowledge of General Education information will improve Description: Including pain rating scale, medication(s)/side effects and non-pharmacologic comfort measures Outcome: Progressing   Problem: Health Behavior/Discharge Planning: Goal: Ability to safely manage health-related needs after discharge will improve Outcome: Progressing   Problem: Clinical Measurements: Goal: Will remain free from infection Outcome: Progressing   Problem: Clinical Measurements: Goal: Cardiovascular complication will be avoided Outcome: Progressing   Problem: Safety: Goal: Ability to remain free from injury will improve Outcome: Progressing   Problem: Skin Integrity: Goal: Risk for impaired skin integrity will decrease Outcome: Progressing

## 2024-04-21 NOTE — Anesthesia Postprocedure Evaluation (Signed)
 Anesthesia Post Note  Patient: [name] Molina  Procedure(s) Performed: CARDIOVERSION     Patient location during evaluation: PACU Anesthesia Type: General Level of consciousness: awake and alert Pain management: pain level controlled Vital Signs Assessment: post-procedure vital signs reviewed and stable Respiratory status: spontaneous breathing, nonlabored ventilation, respiratory function stable and patient connected to nasal cannula oxygen  Cardiovascular status: blood pressure returned to baseline and stable Postop Assessment: no apparent nausea or vomiting Anesthetic complications: no   No notable events documented.  Last Vitals:  Vitals:   04/21/24 1230 04/21/24 1245  BP: 104/73 97/72  Pulse: (!) 126 (!) 116  Resp: 20 (!) 24  Temp:    SpO2: 95% 91%    Last Pain:  Vitals:   04/21/24 1336  TempSrc:   PainSc: 0-No pain                 Thom JONELLE Peoples

## 2024-04-21 NOTE — Assessment & Plan Note (Signed)
 Continue statin

## 2024-04-21 NOTE — Assessment & Plan Note (Signed)
-   seems to be asymptomatic but states she's felt from suprapubic discomfort recently - UA noted with moderate LE, negative nitrite, greater than 50 WBC, rare bacteria - Potential trigger of A-fib with RVR, therefore will treat for now - No urine culture sent, will treat empirically and finish course - Finished 3 doses of Rocephin  empirically prior to discharge

## 2024-04-22 ENCOUNTER — Encounter (HOSPITAL_COMMUNITY): Payer: Self-pay | Admitting: Cardiology

## 2024-04-22 ENCOUNTER — Inpatient Hospital Stay (HOSPITAL_COMMUNITY)

## 2024-04-22 DIAGNOSIS — N3000 Acute cystitis without hematuria: Secondary | ICD-10-CM | POA: Diagnosis not present

## 2024-04-22 DIAGNOSIS — I5032 Chronic diastolic (congestive) heart failure: Secondary | ICD-10-CM | POA: Diagnosis not present

## 2024-04-22 DIAGNOSIS — I4891 Unspecified atrial fibrillation: Secondary | ICD-10-CM | POA: Diagnosis not present

## 2024-04-22 DIAGNOSIS — I700 Atherosclerosis of aorta: Secondary | ICD-10-CM | POA: Diagnosis not present

## 2024-04-22 LAB — ECHOCARDIOGRAM COMPLETE
Area-P 1/2: 4.57 cm2
Height: 62.5 in
Single Plane A4C EF: 51.4 %
Weight: 3633.18 [oz_av]

## 2024-04-22 LAB — BASIC METABOLIC PANEL WITH GFR
Anion gap: 4 — ABNORMAL LOW (ref 5–15)
BUN: 15 mg/dL (ref 8–23)
CO2: 31 mmol/L (ref 22–32)
Calcium: 8.8 mg/dL — ABNORMAL LOW (ref 8.9–10.3)
Chloride: 105 mmol/L (ref 98–111)
Creatinine, Ser: 0.81 mg/dL (ref 0.44–1.00)
GFR, Estimated: >60 mL/min (ref >60)
Glucose, Bld: 131 mg/dL — ABNORMAL HIGH (ref 70–99)
Potassium: 4 mmol/L (ref 3.5–5.1)
Sodium: 140 mmol/L (ref 135–145)

## 2024-04-22 LAB — MAGNESIUM: Magnesium: 2.2 mg/dL (ref 1.7–2.4)

## 2024-04-22 MED ORDER — PERFLUTREN LIPID MICROSPHERE
1.0000 mL | INTRAVENOUS | Status: AC | PRN
  Administered 2024-04-22: 4 mL via INTRAVENOUS

## 2024-04-22 MED ORDER — SODIUM CHLORIDE 0.9 % IV SOLN
1.0000 g | Freq: Once | INTRAVENOUS | Status: AC
  Administered 2024-04-22: 1 g via INTRAVENOUS
  Filled 2024-04-22: qty 10

## 2024-04-22 MED ORDER — DILTIAZEM HCL ER COATED BEADS 240 MG PO CP24
240.0000 mg | ORAL_CAPSULE | Freq: Every morning | ORAL | Status: DC
  Administered 2024-04-22: 240 mg via ORAL
  Filled 2024-04-22: qty 1

## 2024-04-22 NOTE — Evaluation (Addendum)
 Physical Therapy Evaluation Patient Details Name: [name] Molina MRN: [id number] DOB: 05-25-1945 Today's Date: 04/22/2024  History of Present Illness  [name] Molina is a 78 yo female admitted 12/2 with afib with RVR. Cardioversion on 12/3.   PMH COPD, OSA with CPAP intolerance, chronic hypoxic respiratory failure, chronic HFpEF, severe mitral annular calcification followed by cardiology, and PAF on Eliquis   Clinical Impression  [name] Molina admitted with above diagnosis. [name] Molina was able to stand and pivot to recliner with min assist with RW.  [name] Molina slightly unsteady at times and needed mod assist to power up. [name] Molina states she will be fine at home and just figures it out when noone is home.  Asked [name] Molina about post acute rehab and [name] Molina declines.  Recommend HHPT. Has equipment.  [name] Molina currently with functional limitations due to the deficits listed below (see [name] Molina Problem List). [name] Molina will benefit from acute skilled [name] Molina to increase their independence and safety with mobility to allow discharge.           If plan is discharge home, recommend the following: A little help with walking and/or transfers;A little help with bathing/dressing/bathroom;Assistance with cooking/housework;Assist for transportation;Help with stairs or ramp for entrance   Can travel by private vehicle        Equipment Recommendations None recommended by [name] Molina  Recommendations for Other Services       Functional Status Assessment Patient has had a recent decline in their functional status and demonstrates the ability to make significant improvements in function in a reasonable and predictable amount of time.     Precautions / Restrictions Precautions Precautions: Fall Restrictions Weight Bearing Restrictions Per Provider Order: No      Mobility  Bed Mobility Overal bed mobility: Needs Assistance Bed Mobility: Supine to Sit     Supine to sit: Min assist     General bed mobility comments: Needed assist to come to eOB using pad. Incr time for [name] Molina to come  to EOB with rest breaks.    Transfers Overall transfer level: Needs assistance Equipment used: Rolling walker (2 wheels) Transfers: Sit to/from Stand, Bed to chair/wheelchair/BSC Sit to Stand: Mod assist, From elevated surface Stand pivot transfers: Min assist         General transfer comment: [name] Molina needed assist to power up to RW with [name] Molina with one hand up and one hand down.  Once up, [name] Molina needing assist to move RW during transfer with assist for safety with turn and cues for safety with [name] Molina with bil knee instability and poor postural stability.    Ambulation/Gait                  Stairs            Wheelchair Mobility     Tilt Bed    Modified Rankin (Stroke Patients Only)       Balance Overall balance assessment: Needs assistance Sitting-balance support: No upper extremity supported, Feet supported Sitting balance-Leahy Scale: Fair     Standing balance support: Bilateral upper extremity supported, During functional activity, Reliant on assistive device for balance Standing balance-Leahy Scale: Poor Standing balance comment: relies on UE support                             Pertinent Vitals/Pain Pain Assessment Pain Assessment: No/denies pain    Home Living Family/patient expects to be discharged to:: Private residence Living Arrangements: Children;Other relatives Available Help at Discharge: Family;Available PRN/intermittently Type of  Home: House Home Access: Stairs to enter Entrance Stairs-Rails: None Entrance Stairs-Number of Steps: 1 (threshold)   Home Layout: Able to live on main level with bedroom/bathroom Home Equipment: Chiropractor (4 wheels);Shower seat;Wheelchair - manual;BSC/3in1;Transport chair;Cane - single Librarian, Academic (2 wheels);Adaptive equipment      Prior Function Prior Level of Function : Needs assist             Mobility Comments: family bumps up/down threshold in wheelchair for home entry exit.  Amb very limited household distances with rollator. ADLs Comments: daughter assists with showering, [name] Molina Ind with ADLs/selfcare     Extremity/Trunk Assessment   Upper Extremity Assessment Upper Extremity Assessment: Defer to OT evaluation    Lower Extremity Assessment Lower Extremity Assessment: Generalized weakness    Cervical / Trunk Assessment Cervical / Trunk Assessment: Kyphotic  Communication   Communication Communication: No apparent difficulties    Cognition Arousal: Alert Behavior During Therapy: WFL for tasks assessed/performed   [name] Molina - Cognitive impairments: No apparent impairments                         Following commands: Intact       Cueing       General Comments General comments (skin integrity, edema, etc.): VSS on 3LO2    Exercises     Assessment/Plan    [name] Molina Assessment Patient needs continued [name] Molina services  [name] Molina Problem List Decreased activity tolerance;Decreased balance;Decreased mobility;Decreased knowledge of use of DME;Decreased safety awareness;Cardiopulmonary status limiting activity       [name] Molina Treatment Interventions DME instruction;Functional mobility training;Therapeutic activities;Therapeutic exercise;Balance training;Patient/family education;Gait training;Wheelchair mobility training    [name] Molina Goals (Current goals can be found in the Care Plan section)  Acute Rehab [name] Molina Goals Patient Stated Goal: to go home [name] Molina Goal Formulation: With patient Time For Goal Achievement: 05/06/24 Potential to Achieve Goals: Good    Frequency Min 2X/week     Co-evaluation               AM-PAC [name] Molina 6 Clicks Mobility  Outcome Measure Help needed turning from your back to your side while in a flat bed without using bedrails?: A Little Help needed moving from lying on your back to sitting on the side of a flat bed without using bedrails?: A Little Help needed moving to and from a bed to a chair (including a wheelchair)?: A Little Help needed  standing up from a chair using your arms (e.g., wheelchair or bedside chair)?: A Lot Help needed to walk in hospital room?: Total Help needed climbing 3-5 steps with a railing? : Total 6 Click Score: 13    End of Session Equipment Utilized During Treatment: Gait belt Activity Tolerance: Patient limited by fatigue Patient left: in chair;with call bell/phone within reach;with chair alarm set Nurse Communication: Mobility status;Need for lift equipment [name] Molina Visit Diagnosis: Unsteadiness on feet (R26.81);Muscle weakness (generalized) (M62.81)    Time: 9050-8974 [name] Molina Time Calculation (min) (ACUTE ONLY): 36 min   Charges:   [name] Molina Evaluation $[name] Molina Eval Moderate Complexity: 1 Mod [name] Molina Treatments $Therapeutic Activity: 8-22 mins [name] Molina General Charges $$ ACUTE [name] Molina VISIT: 1 Visit         Koty Anctil M,[name] Molina Acute Rehab Services [phone]   Stephane JULIANNA Bevel 04/22/2024, 2:09 PM

## 2024-04-22 NOTE — Discharge Summary (Signed)
 Physician Discharge Summary   [name] Molina FMW:[id number] DOB: 1946/04/29 DOA: 04/20/2024  PCP: Teresa Channel, MD  Admit date: 04/20/2024 Discharge date: 04/22/2024  Admitted From: Home Disposition: Home Discharging physician: Alm Apo, MD Barriers to discharge: None  Recommendations at discharge: Continue outpatient chronic medical management  Discharge Condition: stable CODE STATUS: Full Diet recommendation:  Diet Orders (From admission, onward)     Start     Ordered   04/22/24 0000  Diet - low sodium heart healthy        04/22/24 1135   04/21/24 1226  Diet Heart Room service appropriate? Yes; Fluid consistency: Thin  Diet effective now       Question Answer Comment  Room service appropriate? Yes   Fluid consistency: Thin      04/21/24 1225            Hospital Course: Ms. [name] Molina is a 78 yo female with PMH COPD, OSA with CPAP intolerance, chronic hypoxic respiratory failure, chronic HFpEF, severe mitral annular calcification followed by cardiology, and PAF on Eliquis  who presented with rapid heart rate and fatigue.   She developed RVR in an outpatient clinic prior to admission although was asymptomatic with no palpitations however did have fatigue. Workup in the ER she was found to be in A-fib with RVR with rate in the 130s.  She was started on a Cardizem  drip with minimal response.  Cardiology was also consulted on admission.  Assessment and Plan: * Atrial fibrillation with RVR (HCC) - Anticoagulated at home on Eliquis  with no missed doses per patient.  Good compliance -Chronically treated with Cardizem , flecainide , Toprol .  Usually has good rate control on home regimen with no prior need for cardioversion in the past -Started on Cardizem  drip on admission with difficulty achieving rate control -Evaluated by cardiology with recommendations for consideration of cardioversion - Cardioverted successfully on 04/21/2024 back to sinus rhythm -Continued on home meds at  discharge  Acute cystitis without hematuria - seems to be asymptomatic but states she's felt from suprapubic discomfort recently - UA noted with moderate LE, negative nitrite, greater than 50 WBC, rare bacteria - Potential trigger of A-fib with RVR, therefore will treat for now - No urine culture sent, will treat empirically and finish course - Finished 3 doses of Rocephin  empirically prior to discharge  Chronic diastolic CHF (congestive heart failure) (HCC) - appears euvolemic; no s/s exac - new echo unable to be performed with RVR - last echo 07/05/22: EF 55 to 60%, no RWMA, mild LVH, grade 2 DD - Continue Lasix   Chronic hypoxic respiratory failure (HCC) - Continue O2, on 3 L currently  Hyperlipidemia - Continue statin  COPD (chronic obstructive pulmonary disease) (HCC) - no s/s exac at this time       The patient's acute and chronic medical conditions were treated accordingly. On day of discharge, patient was felt deemed stable for discharge. Patient/family member advised to call PCP or come back to ER if needed.   Principal Diagnosis: Atrial fibrillation with RVR [facility])  Discharge Diagnoses: Active Hospital Problems   Diagnosis Date Noted   Atrial fibrillation with RVR (HCC) 04/20/2024    Priority: 1.   Acute cystitis without hematuria 04/21/2024    Priority: 2.   Chronic diastolic CHF (congestive heart failure) (HCC) 08/13/2020    Priority: 3.   Chronic hypoxic respiratory failure (HCC) 09/29/2020   Hyperlipidemia    COPD (chronic obstructive pulmonary disease) (HCC) 06/13/2011    Resolved Hospital Problems  No  resolved problems to display.     Discharge Instructions     Amb referral to AFIB Clinic   Complete by: As directed    Diet - low sodium heart healthy   Complete by: As directed    Increase activity slowly   Complete by: As directed       Allergies as of 04/22/2024       Reactions   Food Shortness Of Breath   ALLERGY= MUSHROOMS   Levaquin   [levofloxacin  In D5w] Other (See Comments)   made me want to die.   Tequin Shortness Of Breath   Erythromycin Other (See Comments)   GI UPSET   Oxycodone -acetaminophen  Itching   Codeine Nausea And Vomiting   Penicillins Rash, Other (See Comments)   Reaction occurred during childhood   Prednisone  Rash   Wellbutrin [bupropion Hcl] Other (See Comments)   SHAKING        Medication List     TAKE these medications    acetaminophen  650 MG CR tablet Commonly known as: TYLENOL  Take 1,300 mg by mouth daily as needed for pain.   albuterol  108 (90 Base) MCG/ACT inhaler Commonly known as: VENTOLIN  HFA Inhale 2 puffs into the lungs every 6 (six) hours as needed for wheezing or shortness of breath.   Besivance 0.6 % Susp Generic drug: Besifloxacin HCl Place 1 drop into the left eye See admin instructions. Instill 1 drop into the left eye 4 times daily the day OF and day AFTER (eye injection)   Breztri  Aerosphere 160-9-4.8 MCG/ACT Aero inhaler Generic drug: budesonide-glycopyrrolate-formoterol  Inhale 2 puffs into the lungs in the morning and at bedtime.   Cholecalciferol 50 MCG (2000 UT) Caps Take 2,000 Units by mouth daily.   cyanocobalamin  1000 MCG tablet Commonly known as: VITAMIN B12 Take 1,000 mcg by mouth daily.   diltiazem  240 MG 24 hr capsule Commonly known as: CARDIZEM  CD TAKE 1 CAPSULE BY MOUTH EVERY MORNING   DULoxetine  60 MG capsule Commonly known as: CYMBALTA  Take 60 mg by mouth every morning.   Eliquis  5 MG Tabs tablet Generic drug: apixaban  TAKE 1 TABLET (5 MG TOTAL) BY MOUTH 2 (TWO) TIMES DAILY.   flecainide  50 MG tablet Commonly known as: TAMBOCOR  Take 1 tablet (50 mg total) by mouth 2 (two) times daily.   fluticasone  50 MCG/ACT nasal spray Commonly known as: FLONASE  Place 1 spray into both nostrils daily.   furosemide  40 MG tablet Commonly known as: LASIX  TAKE 1 TABLET BY MOUTH EVERY MORNING   ipratropium-albuterol  0.5-2.5 (3) MG/3ML  Soln Commonly known as: DUONEB Take 3 mLs by nebulization every 4 (four) hours as needed (shortness of breath).   LORazepam  0.5 MG tablet Commonly known as: ATIVAN  Take 1-2 tablets (0.5-1 mg total) by mouth daily as needed for anxiety. Anxiety   metoprolol  succinate 50 MG 24 hr tablet Commonly known as: TOPROL -XL TAKE 25 MG (1/2 TABLET) IN THE MORNING AND 50 MG (1 WHOLE TABLET) AT NIGHT.   montelukast  10 MG tablet Commonly known as: SINGULAIR  Take 1 tablet (10 mg total) by mouth at bedtime.   omeprazole 40 MG capsule Commonly known as: PRILOSEC Take 40 mg by mouth every morning.   OXYGEN  Inhale 3 L into the lungs continuous.   pravastatin  40 MG tablet Commonly known as: PRAVACHOL  Take 40 mg by mouth at bedtime.   PreserVision AREDS 2+Multi Vit Caps Take 1 tablet by mouth 2 (two) times daily.        Allergies  Allergen Reactions   Food  Shortness Of Breath    ALLERGY= MUSHROOMS   Levaquin  [Levofloxacin  In D5w] Other (See Comments)    made me want to die.   Tequin Shortness Of Breath   Erythromycin Other (See Comments)    GI UPSET   Oxycodone -Acetaminophen  Itching   Codeine Nausea And Vomiting   Penicillins Rash and Other (See Comments)    Reaction occurred during childhood   Prednisone  Rash   Wellbutrin [Bupropion Hcl] Other (See Comments)    SHAKING    Consultations: Cardiology  Procedures: 04/21/2024: Cardioversion  Discharge Exam: BP 118/79 (BP Location: Left Arm)   Pulse 85   Temp 98.8 F (37.1 C) (Oral)   Resp 20   Ht 5' 2.5 (1.588 m)   Wt 103 kg   SpO2 96%   BMI 40.87 kg/m  Physical Exam Constitutional:      Appearance: Normal appearance.  HENT:     Head: Normocephalic and atraumatic.     Mouth/Throat:     Mouth: Mucous membranes are moist.  Eyes:     Extraocular Movements: Extraocular movements intact.  Cardiovascular:     Rate and Rhythm: Normal rate and regular rhythm.  Pulmonary:     Effort: Pulmonary effort is normal. No  respiratory distress.     Breath sounds: Normal breath sounds. No wheezing.  Abdominal:     General: Bowel sounds are normal. There is no distension.     Palpations: Abdomen is soft.     Tenderness: There is no abdominal tenderness.  Musculoskeletal:        General: Normal range of motion.     Cervical back: Normal range of motion and neck supple.     Right lower leg: Edema present.     Left lower leg: Edema present.  Skin:    General: Skin is warm and dry.     Comments: Darkened skin appreciated on legs  Neurological:     General: No focal deficit present.     Mental Status: She is alert.  Psychiatric:        Mood and Affect: Mood normal.      The results of significant diagnostics from this hospitalization (including imaging, microbiology, ancillary and laboratory) are listed below for reference.   Microbiology: Recent Results (from the past 240 hours)  Resp panel by RT-PCR (RSV, Flu A&B, Covid) Anterior Nasal Swab     Status: None   Collection Time: 04/20/24  9:04 PM   Specimen: Anterior Nasal Swab  Result Value Ref Range Status   SARS Coronavirus 2 by RT PCR NEGATIVE NEGATIVE Final   Influenza A by PCR NEGATIVE NEGATIVE Final   Influenza B by PCR NEGATIVE NEGATIVE Final    Comment: (NOTE) The Xpert Xpress SARS-CoV-2/FLU/RSV plus assay is intended as an aid in the diagnosis of influenza from Nasopharyngeal swab specimens and should not be used as a sole basis for treatment. Nasal washings and aspirates are unacceptable for Xpert Xpress SARS-CoV-2/FLU/RSV testing.  Fact Sheet for Patients: bloggercourse.com  Fact Sheet for Healthcare Providers: seriousbroker.it  This test is not yet approved or cleared by the [place]  FDA and has been authorized for detection and/or diagnosis of SARS-CoV-2 by FDA under an Emergency Use Authorization (EUA). This EUA will remain in effect (meaning this test can be used) for the  duration of the COVID-19 declaration under Section 564(b)(1) of the Act, 21 U.S.C. section 360bbb-3(b)(1), unless the authorization is terminated or revoked.     Resp Syncytial Virus by PCR NEGATIVE NEGATIVE Final  Comment: (NOTE) Fact Sheet for Patients: bloggercourse.com  Fact Sheet for Healthcare Providers: seriousbroker.it  This test is not yet approved or cleared by the [place]  FDA and has been authorized for detection and/or diagnosis of SARS-CoV-2 by FDA under an Emergency Use Authorization (EUA). This EUA will remain in effect (meaning this test can be used) for the duration of the COVID-19 declaration under Section 564(b)(1) of the Act, 21 U.S.C. section 360bbb-3(b)(1), unless the authorization is terminated or revoked.  Performed at [facility], [address], [place], [place] [address]      Labs: BNP (last 3 results) Recent Labs    07/16/23 2350 04/20/24 2018  BNP 77.2 112.6*   Basic Metabolic Panel: Recent Labs  Lab 04/20/24 1132 04/20/24 2018 04/21/24 0456 04/22/24 0442  NA 142 141 144 140  K 3.9 3.9 3.4* 4.0  CL 101 109 110 105  CO2 21 25 26 31   GLUCOSE 124* 128* 129* 131*  BUN 17 22 16 15   CREATININE 0.80 1.17* 0.80 0.81  CALCIUM 9.2 8.2* 8.2* 8.8*  MG  --  2.0 1.8 2.2   Liver Function Tests: Recent Labs  Lab 04/20/24 1132 04/20/24 2018  AST 12 17  ALT 12 12  ALKPHOS 105 81  BILITOT 0.4 0.5  PROT 6.6 6.3*  ALBUMIN 4.2 3.4*   No results for input(s): LIPASE, AMYLASE in the last 168 hours. No results for input(s): AMMONIA in the last 168 hours. CBC: Recent Labs  Lab 04/20/24 1132 04/20/24 2018 04/21/24 0456  WBC 8.1 8.6 7.6  HGB 13.4 13.3 12.3  HCT 42.8 42.6 39.1  MCV 87 86.8 87.1  PLT 244 258 224   Cardiac Enzymes: No results for input(s): CKTOTAL, CKMB, CKMBINDEX, TROPONINI in the last 168 hours. BNP: Invalid input(s): POCBNP CBG: No  results for input(s): GLUCAP in the last 168 hours. D-Dimer No results for input(s): DDIMER in the last 72 hours. Hgb A1c No results for input(s): HGBA1C in the last 72 hours. Lipid Profile No results for input(s): CHOL, HDL, LDLCALC, TRIG, CHOLHDL, LDLDIRECT in the last 72 hours. Thyroid  function studies Recent Labs    04/20/24 2018  TSH 3.984   Anemia work up No results for input(s): VITAMINB12, FOLATE, FERRITIN, TIBC, IRON, RETICCTPCT in the last 72 hours. Urinalysis    Component Value Date/Time   COLORURINE AMBER (A) 04/20/2024 2215   APPEARANCEUR CLOUDY (A) 04/20/2024 2215   LABSPEC 1.036 (H) 04/20/2024 2215   PHURINE 5.0 04/20/2024 2215   GLUCOSEU NEGATIVE 04/20/2024 2215   HGBUR NEGATIVE 04/20/2024 2215   BILIRUBINUR NEGATIVE 04/20/2024 2215   KETONESUR NEGATIVE 04/20/2024 2215   PROTEINUR 30 (A) 04/20/2024 2215   NITRITE NEGATIVE 04/20/2024 2215   LEUKOCYTESUR MODERATE (A) 04/20/2024 2215   Sepsis Labs Recent Labs  Lab 04/20/24 1132 04/20/24 2018 04/21/24 0456  WBC 8.1 8.6 7.6   Microbiology Recent Results (from the past 240 hours)  Resp panel by RT-PCR (RSV, Flu A&B, Covid) Anterior Nasal Swab     Status: None   Collection Time: 04/20/24  9:04 PM   Specimen: Anterior Nasal Swab  Result Value Ref Range Status   SARS Coronavirus 2 by RT PCR NEGATIVE NEGATIVE Final   Influenza A by PCR NEGATIVE NEGATIVE Final   Influenza B by PCR NEGATIVE NEGATIVE Final    Comment: (NOTE) The Xpert Xpress SARS-CoV-2/FLU/RSV plus assay is intended as an aid in the diagnosis of influenza from Nasopharyngeal swab specimens and should not be used as a  sole basis for treatment. Nasal washings and aspirates are unacceptable for Xpert Xpress SARS-CoV-2/FLU/RSV testing.  Fact Sheet for Patients: bloggercourse.com  Fact Sheet for Healthcare Providers: seriousbroker.it  This test is not yet approved  or cleared by the [place]  FDA and has been authorized for detection and/or diagnosis of SARS-CoV-2 by FDA under an Emergency Use Authorization (EUA). This EUA will remain in effect (meaning this test can be used) for the duration of the COVID-19 declaration under Section 564(b)(1) of the Act, 21 U.S.C. section 360bbb-3(b)(1), unless the authorization is terminated or revoked.     Resp Syncytial Virus by PCR NEGATIVE NEGATIVE Final    Comment: (NOTE) Fact Sheet for Patients: bloggercourse.com  Fact Sheet for Healthcare Providers: seriousbroker.it  This test is not yet approved or cleared by the [place]  FDA and has been authorized for detection and/or diagnosis of SARS-CoV-2 by FDA under an Emergency Use Authorization (EUA). This EUA will remain in effect (meaning this test can be used) for the duration of the COVID-19 declaration under Section 564(b)(1) of the Act, 21 U.S.C. section 360bbb-3(b)(1), unless the authorization is terminated or revoked.  Performed at [facility], [address], [place], [place] [address]     Procedures/Studies: ECHOCARDIOGRAM COMPLETE Result Date: 04/22/2024    ECHOCARDIOGRAM REPORT   Patient Name:   [name] Molina Date of Exam: 04/22/2024 Medical Rec #:  [id number]     Height:       62.5 in Accession #:    [id number]    Weight:       227.1 lb Date of Birth:  20-Mar-1946      BSA:          2.030 m Patient Age:    78 years      BP:           120/67 mmHg Patient Gender: F             HR:           95 bpm. Exam Location:  Inpatient Procedure: 2D Echo, Cardiac Doppler, Color Doppler and Intracardiac            Opacification Agent (Both Spectral and Color Flow Doppler were            utilized during procedure). Indications:    Atrial Fibrillation  History:        Patient has prior history of Echocardiogram examinations, most                 recent 07/05/2022. CHF, COPD, Arrythmias:Atrial  Fibrillation,                 Signs/Symptoms:Edema and Dyspnea; Risk Factors:Dyslipidemia,                 Hypertension, Sleep Apnea and Former Smoker.  Sonographer:    Juliene Rucks Referring Phys: [id number] TIMOTHY S OPYD  Sonographer Comments: Suboptimal parasternal window, Technically difficult study due to poor echo windows and patient is obese. Image acquisition challenging due to COPD. IMPRESSIONS  1. Left ventricular ejection fraction, by estimation, is 60 to 65%. The left ventricle has normal function. Left ventricular endocardial border not optimally defined to evaluate regional wall motion. Left ventricular diastolic function could not be evaluated.  2. Right ventricular systolic function is normal. The right ventricular size is not well visualized.  3. The mitral valve is degenerative. No evidence of mitral valve regurgitation. Mild mitral stenosis. The mean mitral valve gradient is 5.0 mmHg with  average heart rate of 92 bpm. Moderate to severe mitral annular calcification.  4. The aortic valve was not well visualized. Aortic valve regurgitation is not visualized. No aortic stenosis is present. Comparison(s): No significant change from prior study. FINDINGS  Left Ventricle: Left ventricular ejection fraction, by estimation, is 60 to 65%. The left ventricle has normal function. Left ventricular endocardial border not optimally defined to evaluate regional wall motion. Definity contrast agent was given IV to delineate the left ventricular endocardial borders. The left ventricular internal cavity size was normal in size. Suboptimal image quality limits for assessment of left ventricular hypertrophy. Left ventricular diastolic function could not be evaluated due to mitral annular calcification (moderate or greater). Left ventricular diastolic function could not be evaluated. Right Ventricle: The right ventricular size is not well visualized. Right vetricular wall thickness was not well visualized. Right  ventricular systolic function is normal. Left Atrium: Left atrial size was normal in size. Right Atrium: Right atrial size was not well visualized. Pericardium: The pericardium was not well visualized. Mitral Valve: The mitral valve is degenerative in appearance. Moderate to severe mitral annular calcification. No evidence of mitral valve regurgitation. Mild mitral valve stenosis. MV peak gradient, 8.1 mmHg. The mean mitral valve gradient is 5.0 mmHg with average heart rate of 92 bpm. Tricuspid Valve: The tricuspid valve is not well visualized. Tricuspid valve regurgitation is not demonstrated. Aortic Valve: The aortic valve was not well visualized. Aortic valve regurgitation is not visualized. No aortic stenosis is present. Pulmonic Valve: The pulmonic valve was not well visualized. Pulmonic valve regurgitation is not visualized. Aorta: The aortic root was not well visualized. IAS/Shunts: The interatrial septum was not well visualized.   LV Volumes (MOD) LV vol d, MOD A4C: 116.0 ml Diastology LV vol s, MOD A4C: 56.4 ml  LV e' medial:    3.89 cm/s LV SV MOD A4C:     116.0 ml LV E/e' medial:  36.5                             LV e' lateral:   4.38 cm/s                             LV E/e' lateral: 32.4  LEFT ATRIUM             Index LA Vol (A2C):   55.3 ml 27.24 ml/m LA Vol (A4C):   57.9 ml 28.53 ml/m LA Biplane Vol: 60.6 ml 29.86 ml/m  AORTIC VALVE LVOT Vmax:   133.00 cm/s LVOT Vmean:  85.900 cm/s LVOT VTI:    0.219 m MITRAL VALVE MV Area (PHT): 4.57 cm     SHUNTS MV Peak grad:  8.1 mmHg     Systemic VTI: 0.22 m MV Mean grad:  5.0 mmHg MV Vmax:       1.42 m/s MV Vmean:      103.0 cm/s MV Decel Time: 166 msec MV E velocity: 142.00 cm/s MV A velocity: 120.00 cm/s MV E/A ratio:  1.18 Franck Azobou Tonleu Electronically signed by Joelle Cedars Tonleu Signature Date/Time: 04/22/2024/10:04:30 AM    Final    EP STUDY Result Date: 04/21/2024 See surgical note for result.  DG Chest Portable 1 View Result Date:  04/20/2024 CLINICAL DATA:  SOB, Afib rvr, fatigue EXAM: PORTABLE CHEST - 1 VIEW COMPARISON:  07/29/2023 FINDINGS: Low lung volumes with streaky bibasilar atelectasis. No focal airspace  consolidation, pleural effusion, or pneumothorax. Mild cardiomegaly. Tortuous aorta with aortic atherosclerosis. No acute fracture or destructive lesions. Multilevel thoracic osteophytosis. IMPRESSION: Mild cardiomegaly. No pneumonia or pulmonary edema. Electronically Signed   By: Rogelia Myers M.D.   On: 04/20/2024 21:32     Time coordinating discharge: Over 30 minutes    Alm Apo, MD  [facility] 04/22/2024, 5:32 PM

## 2024-04-22 NOTE — Progress Notes (Signed)
  Progress Note  Patient Name: [name] Molina Date of Encounter: 04/22/2024 [facility] Cardiologist: Oneil Parchment, MD   Interval Summary    Resting in bed Undergoing echocardiogram Reports feeling better now that she's back in sinus Discussed importance of no missed doses of Eliquis , she will have someone help her take care of her medications   Vital Signs Vitals:   04/21/24 1959 04/21/24 2028 04/22/24 0010 04/22/24 0307  BP: 119/62 131/63 (!) 118/57 120/67  Pulse: 92 77 87 97  Resp:  19 19 19   Temp:  99.1 F (37.3 C) 97.9 F (36.6 C) 98.1 F (36.7 C)  TempSrc:  Axillary Oral Oral  SpO2: 98% 95% 99% 100%  Weight:      Height:        Intake/Output Summary (Last 24 hours) at 04/22/2024 0846 Last data filed at 04/22/2024 0506 Gross per 24 hour  Intake --  Output 750 ml  Net -750 ml      04/20/2024    7:57 PM 04/20/2024   10:30 AM 03/03/2024   11:23 AM  Last 3 Weights  Weight (lbs) 227 lb 1.2 oz -- 227 lb  Weight (kg) 103 kg -- 102.967 kg     Telemetry/ECG  Sinus, HR 80s - Personally Reviewed  Physical Exam  GEN: No acute distress.   Neck: No JVD Cardiac: RRR, no murmurs, rubs, or gallops.  Respiratory: Clear to auscultation bilaterally. GI: Soft, nontender, non-distended  MS: chronic puffy LE edema  Assessment & Plan   Paroxysmal A-Fib with RVR Home meds: Eliquis  5 mg BID, diltiazem  240 mg daily, Toprol  25 mg in AM, 50 mg in PM, flecainide  50 mg BID Underwent successful DCCV 12/3 Remains in sinus rhythm Restart home PO diltiazem  240 mg, replacing IV   Continue Toprol  25 mg in AM, 50 mg in PM Continue flecainide  50 mg BID Continue Eliquis  5 mg BID Scheudled follow up with Dr. Parchment 05/2024 Pending echocardiogram, will follow results  Hyperlipidemia Continue pravastatin  40 mg at bedtime    Chronic HFpEF Euvolemic on exam Home meds: Lasix  40 mg daily Continue PO Lasix  40 mg daily  Pending echocardiogram, will follow results   Per  primary COPD Urinary tract infection  For questions or updates, please contact [facility] Please consult [email or web address] for contact info under   Signed, Waddell DELENA Donath, PA-C

## 2024-04-22 NOTE — Progress Notes (Signed)
 Echocardiogram 2D Echocardiogram has been performed.  [name] Molina 04/22/2024, 9:19 AM

## 2024-04-22 NOTE — TOC CM/SW Note (Signed)
 Transition of Care [facility]) - Inpatient Brief Assessment   Patient Details  Name: [name] Molina MRN: [id number] Date of Birth: 1945-09-04  Transition of [facility] CM/SW Contact:    Sudie Erminio Deems, RN Phone Number: 04/22/2024, 1:18 PM   Clinical Narrative: Patient presented for atrial fib and fatigue. PTA patient states she was from home with daughter and grandchildren. Patient has DME inogen oxygen  tank and rollator. Patient states she has a PCP and has no issues with transportation. Patient states her son will provide transportation home via private vehicle. No home needs identified at this time.    Transition of Care Asessment: Insurance and Status: Insurance coverage has been reviewed Patient has primary care physician: Yes Home environment has been reviewed: reviewed Prior level of function:: uses rollator and [employer] Home Services: No current home services Social Drivers of Health Review: SDOH reviewed no interventions necessary Readmission risk has been reviewed: Yes Transition of care needs: no transition of care needs at this time

## 2024-04-30 ENCOUNTER — Other Ambulatory Visit: Payer: Self-pay | Admitting: Cardiology

## 2024-05-04 ENCOUNTER — Encounter (INDEPENDENT_AMBULATORY_CARE_PROVIDER_SITE_OTHER): Admitting: Ophthalmology

## 2024-05-11 ENCOUNTER — Ambulatory Visit (HOSPITAL_BASED_OUTPATIENT_CLINIC_OR_DEPARTMENT_OTHER)

## 2024-05-11 ENCOUNTER — Encounter (INDEPENDENT_AMBULATORY_CARE_PROVIDER_SITE_OTHER): Admitting: Ophthalmology

## 2024-05-11 DIAGNOSIS — I10 Essential (primary) hypertension: Secondary | ICD-10-CM | POA: Diagnosis not present

## 2024-05-11 DIAGNOSIS — H353221 Exudative age-related macular degeneration, left eye, with active choroidal neovascularization: Secondary | ICD-10-CM | POA: Diagnosis not present

## 2024-05-11 DIAGNOSIS — H43813 Vitreous degeneration, bilateral: Secondary | ICD-10-CM | POA: Diagnosis not present

## 2024-05-11 DIAGNOSIS — H35033 Hypertensive retinopathy, bilateral: Secondary | ICD-10-CM | POA: Diagnosis not present

## 2024-05-11 DIAGNOSIS — H353112 Nonexudative age-related macular degeneration, right eye, intermediate dry stage: Secondary | ICD-10-CM | POA: Diagnosis not present

## 2024-05-17 ENCOUNTER — Other Ambulatory Visit: Payer: Self-pay

## 2024-05-17 NOTE — Patient Instructions (Signed)
 Visit Information  Thank you for taking time to visit with me today. Please don't hesitate to contact me if I can be of assistance to you before our next scheduled appointment.  Your next care management appointment is by telephone on Monday, January 12th at 11am.  Please call the care guide team at [phone] if you need to cancel, schedule, or reschedule an appointment.   Please call the [place]  National Suicide Prevention Lifeline: 847-630-4535 or TTY: [phone] TTY (414)768-3405) to talk to a trained counselor if you are experiencing a Mental Health or Behavioral Health Crisis or need someone to talk to.  Santana Stamp BSN, CCM [place]  VBCI Population Health RN Care Manager Direct Dial: [phone]  Fax: [phone]

## 2024-05-17 NOTE — Patient Outreach (Addendum)
 Complex Care Management   Visit Note  05/17/2024  Name:  [name] Molina MRN: [id number] DOB: 09/16/1945  Situation: Referral received for Complex Care Management related to Emphysema. I obtained verbal consent from Patient.  Visit completed with [name] Molina  on the phone  Background:   Past Medical History:  Diagnosis Date   Anxiety    Aortic atherosclerosis    Arthritis    Asthma    Atrial tachycardia    Cancer (HCC)    basal skin cancer on nose   Cataract    Chronic diastolic CHF (congestive heart failure) (HCC)    Complication of anesthesia    during cervical surgery she woke up during the procedure   COPD (chronic obstructive pulmonary disease) (HCC)    Depression    Diverticulosis    Family history of adverse reaction to anesthesia    mother had post op N&V   GERD (gastroesophageal reflux disease)    History of hiatal hernia    Hyperlipidemia    Hypertension    Insomnia    Macular degeneration    Migraines    Neuropathy    Obesity    PAF (paroxysmal atrial fibrillation) (HCC)    Pneumonia    Sleep apnea    history of it,not using a cpap, study 2018: didn't need cpap   Thyroid  nodule 2000   radioactive caspule    Assessment: Patient Reported Symptoms:  Cognitive Cognitive Status: Alert and oriented to person, place, and time, Insightful and able to interpret abstract concepts, Normal speech and language skills      Neurological Neurological Review of Symptoms: No symptoms reported    HEENT HEENT Symptoms Reported: Not assessed      Cardiovascular Cardiovascular Symptoms Reported: Swelling in legs or feet Does patient have uncontrolled Hypertension?: No Cardiovascular Comment: Left foot edema. Reports fatigue at baseline  Respiratory Respiratory Symptoms Reported: Other: Other Respiratory Symptoms: Breathing is at baseline Additional Respiratory Details: Continues to take Breztri  twice a day, albuterol  as rescue, 3L O2 in  place.    Endocrine Endocrine  Symptoms Reported: Not assessed    Gastrointestinal Gastrointestinal Symptoms Reported: No symptoms reported      Genitourinary Genitourinary Symptoms Reported: Frequency, Urgency Additional Genitourinary Details: No bladder pain.    Integumentary Integumentary Symptoms Reported: No symptoms reported    Musculoskeletal Musculoskelatal Symptoms Reviewed: Not assessed        Psychosocial Psychosocial Symptoms Reported: No symptoms reported          05/17/2024    PHQ2-9 Depression Screening   Little interest or pleasure in doing things    Feeling down, depressed, or hopeless    PHQ-2 - Total Score    Trouble falling or staying asleep, or sleeping too much    Feeling tired or having little energy    Poor appetite or overeating     Feeling bad about yourself - or that you are a failure or have let yourself or your family down    Trouble concentrating on things, such as reading the newspaper or watching television    Moving or speaking so slowly that other people could have noticed.  Or the opposite - being so fidgety or restless that you have been moving around a lot more than usual    Thoughts that you would be better off dead, or hurting yourself in some way    PHQ2-9 Total Score    If you checked off any problems, how difficult have these problems made it  for you to do your work, take care of things at home, or get along with other people    Depression Interventions/Treatment      There were no vitals filed for this visit. Pain Scale: 0-10 Pain Score: 0-No pain  Medications Reviewed Today     Reviewed by [name] Molina LABOR, RN (Registered Nurse) on 05/17/24 at 1123  Med List Status: <None>   Medication Order Taking? Sig Documenting Provider Last Dose Status Informant  acetaminophen  (TYLENOL ) 650 MG CR tablet [id number]  Take 1,300 mg by mouth daily as needed for pain. [provider]  Active Self  albuterol  (VENTOLIN  HFA) 108 (90 Base) MCG/ACT inhaler [id number] Yes  Inhale 2 puffs into the lungs every 6 (six) hours as needed for wheezing or shortness of breath. [name] Molina RAMAN, MD  Active   BESIVANCE 0.6 % SUSP [id number]  Place 1 drop into the left eye See admin instructions. Instill 1 drop into the left eye 4 times daily the day OF and day AFTER (eye injection) [provider]  Active Self  Budeson-Glycopyrrol-Formoterol  (BREZTRI  AEROSPHERE) 160-9-4.8 MCG/ACT AERO [id number] Yes Inhale 2 puffs into the lungs in the morning and at bedtime. [name] Molina RAMAN, MD  Active Self  Cholecalciferol 50 MCG (2000 UT) CAPS [id number]  Take 2,000 Units by mouth daily. [provider]  Active Self  cyanocobalamin  (VITAMIN B12) 1000 MCG tablet [id number]  Take 1,000 mcg by mouth daily. [provider]  Active   diltiazem  (CARDIZEM  CD) 240 MG 24 hr capsule [id number] Yes TAKE 1 CAPSULE BY MOUTH EVERY MORNING [name] Molina, [name] BROCKS, MD  Active   DULoxetine  (CYMBALTA ) 60 MG capsule [id number]  Take 60 mg by mouth every morning. [provider]  Active Self  ELIQUIS  5 MG TABS tablet [id number] Yes TAKE 1 TABLET (5 MG TOTAL) BY MOUTH 2 (TWO) TIMES DAILY. [name] [name] BROCKS, MD  Active   flecainide  (TAMBOCOR ) 50 MG tablet [id number] Yes Take 1 tablet (50 mg total) by mouth 2 (two) times daily. [name] [name] BROCKS, MD  Active   fluticasone  (FLONASE ) 50 MCG/ACT nasal spray [id number] Yes Place 1 spray into both nostrils daily. [name] Molina, [name] S, MD  Active Self  furosemide  (LASIX ) 40 MG tablet [id number] Yes TAKE 1 TABLET BY MOUTH EVERY MORNING [name] [name] BROCKS, MD  Active Self           Med Note CARLISLE, ARKANSAS A   Wed Oct 15, 2023  2:28 PM) Can take an extra tablet for ankle/leg/foot swelling up to three days.   ipratropium-albuterol  (DUONEB) 0.5-2.5 (3) MG/3ML SOLN [id number] Yes Take 3 mLs by nebulization every 4 (four) hours as needed (shortness of breath). [name] Molina, [name] S, MD  Active   LORazepam  (ATIVAN ) 0.5 MG tablet [id number]  Take 1-2 tablets (0.5-1 mg total) by mouth daily  as needed for anxiety. Anxiety Amin, Ankit C, MD  Active Self  metoprolol  succinate (TOPROL -XL) 50 MG 24 hr tablet [id number] Yes TAKE 25 MG (1/2 TABLET) IN THE MORNING AND 50 MG (1 WHOLE TABLET) AT NIGHT. Parthenia Olivia HERO, PA-C  Active   montelukast  (SINGULAIR ) 10 MG tablet [id number]  Take 1 tablet (10 mg total) by mouth at bedtime. [name] Molina, [name] S, MD  Active   Multiple Vitamins-Minerals (PRESERVISION AREDS 2+MULTI VIT) CAPS [id number]  Take 1 tablet by mouth 2 (two) times daily. [provider]  Active Self  omeprazole (PRILOSEC) 40 MG capsule [id number] Yes Take 40 mg by mouth every morning. [provider]  Active  OXYGEN  [id number] Yes Inhale 3 L into the lungs continuous. [provider]  Active Self  pravastatin  (PRAVACHOL ) 40 MG tablet [id number] Yes Take 40 mg by mouth at bedtime.  [provider]  Active Self            Recommendation:   Specialty provider follow-up :Cardiology 05/25/2024; Pulmonary 07/22/2024 Discussed afib red flag symptoms that would need to be reported to Cardiology (denies symptoms today).    Follow Up Plan:   Telephone follow-up two weeks.  Molina Stamp BSN, CCM   [facility] Population Health RN Care Manager Direct Dial: [phone]  Fax: [phone]

## 2024-05-25 ENCOUNTER — Ambulatory Visit: Admitting: Cardiology

## 2024-05-25 VITALS — BP 105/65 | HR 75 | Ht 62.5 in | Wt 227.0 lb

## 2024-05-25 DIAGNOSIS — I480 Paroxysmal atrial fibrillation: Secondary | ICD-10-CM | POA: Diagnosis not present

## 2024-05-25 DIAGNOSIS — J438 Other emphysema: Secondary | ICD-10-CM | POA: Diagnosis not present

## 2024-05-25 DIAGNOSIS — I3481 Nonrheumatic mitral (valve) annulus calcification: Secondary | ICD-10-CM | POA: Insufficient documentation

## 2024-05-25 DIAGNOSIS — Z7901 Long term (current) use of anticoagulants: Secondary | ICD-10-CM | POA: Insufficient documentation

## 2024-05-25 NOTE — Patient Instructions (Signed)
 Medication Instructions:  The current medical regimen is effective;  continue present plan and medications. You take take an extra Furosemide  or Flecainide  if needed per Dr Jeffrie.  *If you need a refill on your cardiac medications before your next appointment, please call your pharmacy*  Follow-Up: At [facility], you and your health needs are our priority.  As part of our continuing mission to provide you with exceptional heart care, our providers are all part of one team.  This team includes your primary Cardiologist (physician) and Advanced Practice Providers or APPs (Physician Assistants and Nurse Practitioners) who all work together to provide you with the care you need, when you need it.  Your next appointment:   6 months  Provider:   Oneil Jeffrie, MD    We recommend signing up for the patient portal called MyChart.  Sign up information is provided on this After Visit Summary.  MyChart is used to connect with patients for Virtual Visits (Telemedicine).  Patients are able to view lab/test results, encounter notes, upcoming appointments, etc.  Non-urgent messages can be sent to your provider as well.   To learn more about what you can do with MyChart, go to [email or web address].

## 2024-05-25 NOTE — Progress Notes (Unsigned)
 " Cardiology Office Note:  .   Date:  05/26/2024  ID:  [name] Molina, [name] [birth year], MRN [id number] PCP: Teresa Channel, MD  [facility] Providers Cardiologist:  Oneil Parchment, MD Electrophysiologist:  Will Gladis Norton, MD    History of Present Illness: .   [name] Molina is a 79 y.o. female Discussed the use of AI scribe   History of Present Illness [name] Molina is an 79 year old female with paroxysmal atrial fibrillation who presents for follow-up. Here with daughter who works in [employer]   She has severe mitral annular calcification and COPD. Her echocardiogram in 2024 showed an ejection fraction of 60% with a normal right ventricle. She has been maintaining sinus rhythm fairly well with her current medication regimen, which includes flecainide  50 mg twice a day, diltiazem  240 mg daily, and Toprol  XL, which was increased to 50 mg at the last visit. Currently, she takes 25 mg in the morning and 50 mg at night.  She was recently hospitalized due to atrial fibrillation with rapid ventricular response. She was anticoagulated at home with Eliquis , with no missed doses and good compliance. During the hospital stay, she was treated with Cardizem , flecainide , and Toprol , and was given a Cardizem  drip on admission. Despite this, she had difficulty controlling the rhythm, leading to a cardioversion on April 21, 2024, which successfully returned her to sinus rhythm. However, she experienced a recurrence of atrial fibrillation after returning home, which resolved after a day or two.  She describes her symptoms during episodes of atrial fibrillation as feeling like her face is swelling up. She is not always able to tell when she goes into atrial fibrillation, but her daughter notes that she usually feels 'burning up' during these episodes.  She is currently taking Lasix  40 mg, and her daughter helps manage her medications, ensuring they are organized and administered correctly. She also reports some  edema, particularly in her ankles, and describes the skin on the top of her foot as looking like it might split due to swelling.       Studies Reviewed: .        Results Labs Creatinine: 0  Diagnostic Echocardiogram (2024): Left ventricular ejection fraction 60%, normal right ventricular function EKG: Normal sinus rhythm (Independently interpreted) Risk Assessment/Calculations:    CHA2DS2-VASc Score = 4   This indicates a 4.8% annual risk of stroke. The patient's score is based upon: CHF History: 1 HTN History: 0 Diabetes History: 0 Stroke History: 0 Vascular Disease History: 0 Age Score: 2 Gender Score: 1            Physical Exam:   VS:  BP 105/65 (BP Location: Right Arm, Patient Position: Sitting, Cuff Size: Large)   Pulse 75   Ht 5' 2.5 (1.588 m)   Wt 227 lb (103 kg)   SpO2 97%   BMI 40.86 kg/m    Wt Readings from Last 3 Encounters:  05/25/24 227 lb (103 kg)  04/20/24 227 lb 1.2 oz (103 kg)  03/03/24 227 lb (103 kg)    GEN: Well nourished, well developed in no acute distress NECK: No JVD; No carotid bruits CARDIAC: RRR, no murmurs, no rubs, no gallops RESPIRATORY:  Clear to auscultation without rales, wheezing or rhonchi  ABDOMEN: Soft, non-tender, non-distended EXTREMITIES:  No edema; No deformity   ASSESSMENT AND PLAN: .    Assessment and Plan Assessment & Plan Paroxysmal atrial fibrillation Chronic anticoagulation Recent hospitalization for atrial fibrillation with rapid ventricular  response. Cardioversion was performed, and she returned to sinus rhythm. Recurrence of AFib noted after discharge, with episodes of elevated heart rate. Currently on flecainide , diltiazem , and Toprol  XL. Discussion about increasing flecainide  dose, but concerns about potential bradycardia and medication processing in older adults. Consideration of taking an additional dose of flecainide  during AFib episodes as a 'pill in the pocket' approach. - Continue flecainide  50 mg  bid. - Continue diltiazem  240 mg daily. - Continue Toprol  XL 25 mg in the morning and 50 mg at night. - Consider taking an additional 50 mg of flecainide  during AFib episodes as a 'pill in the pocket' approach. -Continue eliquis  with monitoring of Hgb and Creat.   Severe mitral annular calcification -Stable  Chronic obstructive pulmonary disease (COPD) - limits her activity.          Dispo: 6 mths  Signed, Oneil Parchment, MD  "

## 2024-05-31 ENCOUNTER — Other Ambulatory Visit: Payer: Self-pay

## 2024-05-31 NOTE — Patient Outreach (Signed)
 Complex Care Management   Visit Note  05/31/2024  Name:  [name] Molina MRN: [id number] DOB: 1946/03/07  Situation: Referral received for Complex Care Management related to Emphysema I obtained verbal consent from Patient.  Visit completed with Ms. Haft  on the phone  Background:    Hospitalization 12/02-12/04/26 for Cystitis and AFIB.   Past Medical History:  Diagnosis Date   Anxiety    Aortic atherosclerosis    Arthritis    Asthma    Atrial tachycardia    Cancer (HCC)    basal skin cancer on nose   Cataract    Chronic diastolic CHF (congestive heart failure) (HCC)    Complication of anesthesia    during cervical surgery she woke up during the procedure   COPD (chronic obstructive pulmonary disease) (HCC)    Depression    Diverticulosis    Family history of adverse reaction to anesthesia    mother had post op N&V   GERD (gastroesophageal reflux disease)    History of hiatal hernia    Hyperlipidemia    Hypertension    Insomnia    Macular degeneration    Migraines    Neuropathy    Obesity    PAF (paroxysmal atrial fibrillation) (HCC)    Pneumonia    Sleep apnea    history of it,not using a cpap, study 2018: didn't need cpap   Thyroid  nodule 2000   radioactive caspule    Assessment: Patient Reported Symptoms:  Cognitive Cognitive Status: Alert and oriented to person, place, and time, Insightful and able to interpret abstract concepts, Normal speech and language skills      Neurological Neurological Review of Symptoms: No symptoms reported    HEENT HEENT Symptoms Reported: Not assessed      Cardiovascular Cardiovascular Symptoms Reported: Swelling in legs or feet Cardiovascular Comment: Left foot edema at baseline.  Cardio OV 05/25/24, orders to take an extra flecainide  for afib episodes.  Discussed if Cardio placed order for PRN purposes.  Patient believes they have a bottle of flecainide  for as needed (she get pre-packaged med through [facility]), daughter  prepares her meds and is not home, she will check with daughter when she returns home after Wednesday this week.  Respiratory Other Respiratory Symptoms: Breathing at baseline Additional Respiratory Details: Continues to take Breztri  twice a day, albuterol  as rescue, 3L O2 in place.    Endocrine Endocrine Symptoms Reported: Not assessed    Gastrointestinal Gastrointestinal Symptoms Reported: Not assessed      Genitourinary Genitourinary Symptoms Reported: Not assessed    Integumentary Integumentary Symptoms Reported: Not assessed    Musculoskeletal Musculoskelatal Symptoms Reviewed: Limited mobility, Difficulty walking Musculoskeletal Comment: Uses rollator      Psychosocial Psychosocial Symptoms Reported: Not assessed          05/31/2024    PHQ2-9 Depression Screening   Little interest or pleasure in doing things    Feeling down, depressed, or hopeless    PHQ-2 - Total Score    Trouble falling or staying asleep, or sleeping too much    Feeling tired or having little energy    Poor appetite or overeating     Feeling bad about yourself - or that you are a failure or have let yourself or your family down    Trouble concentrating on things, such as reading the newspaper or watching television    Moving or speaking so slowly that other people could have noticed.  Or the opposite - being so fidgety or  restless that you have been moving around a lot more than usual    Thoughts that you would be better off dead, or hurting yourself in some way    PHQ2-9 Total Score    If you checked off any problems, how difficult have these problems made it for you to do your work, take care of things at home, or get along with other people    Depression Interventions/Treatment      There were no vitals filed for this visit.    MEDICATIONS: Discussed flecainide , needs extra bottle for PRN use, she will check with daughter after 06/02/24 to see if extra bottle has been ordered (she receives  pre-packaged meds).     Recommendation:   Specialty provider follow-up :Pulmonary 07/22/24; Cardio in 6 months or if needed before. She will inquire with daughter if she has extra bottle of flecainide  for as needed purpose so she doesn't run out of the pre-packaged med if she has to take for afib episodes as ordered by Cardiology.   Follow Up Plan:   Telephone follow-up in 1 month  Santana Stamp BSN, CCM [place]  [facility] Population Health RN Care Manager Direct Dial: [phone]  Fax: [phone]

## 2024-05-31 NOTE — Patient Instructions (Signed)
 Visit Information  Thank you for taking time to visit with me today. Please don't hesitate to contact me if I can be of assistance to you before our next scheduled appointment.  Your next care management appointment is by telephone on Monday, February 2nd at 11:30am.    Please call the care guide team at [phone] if you need to cancel, schedule, or reschedule an appointment.   Please call the [place]  National Suicide Prevention Lifeline: (225)514-4950 or TTY: (404)109-6503 TTY 6137425409) to talk to a trained counselor if you are experiencing a Mental Health or Behavioral Health Crisis or need someone to talk to.  Santana Stamp BSN, CCM [place]  [facility] Population Health RN Care Manager Direct Dial: [phone]  Fax: [phone]

## 2024-06-21 ENCOUNTER — Other Ambulatory Visit: Payer: Self-pay

## 2024-06-21 NOTE — Patient Outreach (Addendum)
 Complex Care Management   Visit Note  06/21/2024  Name:  [name] Molina MRN: [id number] DOB: [birth year]  Situation: Referral received for Complex Care Management related to Emphysema. I obtained verbal consent from Patient.  Visit completed with [name] Molina  on the phone. Patient is in good spirits, denies concerns today.  Background:   Past Medical History:  Diagnosis Date   Anxiety    Aortic atherosclerosis    Arthritis    Asthma    Atrial tachycardia    Cancer (HCC)    basal skin cancer on nose   Cataract    Chronic diastolic CHF (congestive heart failure) (HCC)    Complication of anesthesia    during cervical surgery she woke up during the procedure   COPD (chronic obstructive pulmonary disease) (HCC)    Depression    Diverticulosis    Family history of adverse reaction to anesthesia    mother had post op N&V   GERD (gastroesophageal reflux disease)    History of hiatal hernia    Hyperlipidemia    Hypertension    Insomnia    Macular degeneration    Migraines    Neuropathy    Obesity    PAF (paroxysmal atrial fibrillation) (HCC)    Pneumonia    Sleep apnea    history of it,not using a cpap, study 2018: didn't need cpap   Thyroid  nodule 2000   radioactive caspule    Assessment: Patient Reported Symptoms:  Cognitive Cognitive Status: Alert and oriented to person, place, and time, Insightful and able to interpret abstract concepts, Normal speech and language skills      Neurological Neurological Review of Symptoms: No symptoms reported    HEENT HEENT Symptoms Reported: Not assessed      Cardiovascular Cardiovascular Symptoms Reported: Swelling in legs or feet Does patient have uncontrolled Hypertension?: No Cardiovascular Comment: Left foot edema at baseline.  Respiratory Respiratory Symptoms Reported: Other: Other Respiratory Symptoms: Has shortness of breath but breathing at baseline.  Patient is in the GREEN ZONE today. Additional Respiratory Details:  Continues to take Breztri  twice a day, albuterol  as rescue, 3L O2 in place, extra tanks in the home in case power goes out.    Endocrine Endocrine Symptoms Reported: Not assessed Is patient diabetic?: No    Gastrointestinal Gastrointestinal Symptoms Reported: Not assessed      Genitourinary Genitourinary Symptoms Reported: Not assessed    Integumentary Integumentary Symptoms Reported: Not assessed    Musculoskeletal Musculoskelatal Symptoms Reviewed: Difficulty walking, Limited mobility        Psychosocial Psychosocial Symptoms Reported: Not assessed          06/21/2024    PHQ2-9 Depression Screening   Little interest or pleasure in doing things    Feeling down, depressed, or hopeless    PHQ-2 - Total Score    Trouble falling or staying asleep, or sleeping too much    Feeling tired or having little energy    Poor appetite or overeating     Feeling bad about yourself - or that you are a failure or have let yourself or your family down    Trouble concentrating on things, such as reading the newspaper or watching television    Moving or speaking so slowly that other people could have noticed.  Or the opposite - being so fidgety or restless that you have been moving around a lot more than usual    Thoughts that you would be better off dead, or hurting yourself in  some way    PHQ2-9 Total Score    If you checked off any problems, how difficult have these problems made it for you to do your work, take care of things at home, or get along with other people    Depression Interventions/Treatment      There were no vitals filed for this visit. Pain Scale: 0-10 Pain Score: 0-No pain  MEDICATIONS: Patient denies issues with refills, states she has inhalers in her home to take as ordered, receives pre-packaged medications.  Understands she can take an extra furosemide  for three days for increased edema or increased shortness of breath.    Recommendation:   Specialty Provider Follow  Up: Pulmonology 07/22/24 Discussed COPD ACTION PLAN and zones.  Sent education material by [employer] as a reminder of symptoms to report.  Continue Current Plan of Care  Follow Up Plan:   Telephone follow-up in 1 month  Santana Stamp BSN, CCM [place]  [facility] Population Health RN Care Manager Direct Dial: [phone]  Fax: [phone]

## 2024-07-19 ENCOUNTER — Telehealth

## 2024-07-22 ENCOUNTER — Ambulatory Visit: Admitting: Emergency Medicine

## 2024-10-12 ENCOUNTER — Encounter (INDEPENDENT_AMBULATORY_CARE_PROVIDER_SITE_OTHER): Admitting: Ophthalmology
